# Patient Record
Sex: Male | Born: 1945
Health system: Southern US, Community
[De-identification: ages and names within clinical notes are randomized; demographics above are authoritative.]

## PROBLEM LIST (undated history)

## (undated) DIAGNOSIS — K8020 Calculus of gallbladder without cholecystitis without obstruction: Secondary | ICD-10-CM

## (undated) DIAGNOSIS — B159 Hepatitis A without hepatic coma: Secondary | ICD-10-CM

## (undated) DIAGNOSIS — H34219 Partial retinal artery occlusion, unspecified eye: Secondary | ICD-10-CM

## (undated) DIAGNOSIS — E669 Obesity, unspecified: Secondary | ICD-10-CM

## (undated) DIAGNOSIS — I639 Cerebral infarction, unspecified: Secondary | ICD-10-CM

## (undated) DIAGNOSIS — F329 Major depressive disorder, single episode, unspecified: Secondary | ICD-10-CM

## (undated) DIAGNOSIS — E785 Hyperlipidemia, unspecified: Secondary | ICD-10-CM

## (undated) DIAGNOSIS — H269 Unspecified cataract: Secondary | ICD-10-CM

## (undated) DIAGNOSIS — K224 Dyskinesia of esophagus: Secondary | ICD-10-CM

## (undated) DIAGNOSIS — D649 Anemia, unspecified: Secondary | ICD-10-CM

## (undated) DIAGNOSIS — D689 Coagulation defect, unspecified: Secondary | ICD-10-CM

## (undated) DIAGNOSIS — F419 Anxiety disorder, unspecified: Secondary | ICD-10-CM

## (undated) DIAGNOSIS — K859 Acute pancreatitis without necrosis or infection, unspecified: Secondary | ICD-10-CM

## (undated) DIAGNOSIS — F319 Bipolar disorder, unspecified: Secondary | ICD-10-CM

## (undated) DIAGNOSIS — M1990 Unspecified osteoarthritis, unspecified site: Secondary | ICD-10-CM

## (undated) DIAGNOSIS — N200 Calculus of kidney: Secondary | ICD-10-CM

## (undated) DIAGNOSIS — K219 Gastro-esophageal reflux disease without esophagitis: Secondary | ICD-10-CM

## (undated) DIAGNOSIS — F32A Depression, unspecified: Secondary | ICD-10-CM

## (undated) DIAGNOSIS — I10 Essential (primary) hypertension: Secondary | ICD-10-CM

## (undated) DIAGNOSIS — J449 Chronic obstructive pulmonary disease, unspecified: Secondary | ICD-10-CM

## (undated) DIAGNOSIS — T7840XA Allergy, unspecified, initial encounter: Secondary | ICD-10-CM

## (undated) DIAGNOSIS — Z86010 Personal history of colonic polyps: Secondary | ICD-10-CM

## (undated) HISTORY — PX: CATARACT EXTRACTION: SUR2

## (undated) HISTORY — DX: Personal history of colonic polyps: Z86.010

## (undated) HISTORY — DX: Calculus of kidney: N20.0

## (undated) HISTORY — DX: Unspecified cataract: H26.9

## (undated) HISTORY — PX: EYE SURGERY: SHX253

## (undated) HISTORY — DX: Coagulation defect, unspecified: D68.9

## (undated) HISTORY — DX: Essential (primary) hypertension: I10

## (undated) HISTORY — DX: Acute pancreatitis without necrosis or infection, unspecified: K85.90

## (undated) HISTORY — DX: Allergy, unspecified, initial encounter: T78.40XA

## (undated) HISTORY — DX: Hyperlipidemia, unspecified: E78.5

## (undated) HISTORY — DX: Major depressive disorder, single episode, unspecified: F32.9

## (undated) HISTORY — DX: Depression, unspecified: F32.A

## (undated) HISTORY — DX: Bipolar disorder, unspecified: F31.9

## (undated) HISTORY — DX: Chronic obstructive pulmonary disease, unspecified: J44.9

## (undated) HISTORY — DX: Anemia, unspecified: D64.9

## (undated) HISTORY — PX: JOINT REPLACEMENT: SHX530

## (undated) HISTORY — DX: Hepatitis a without hepatic coma: B15.9

## (undated) HISTORY — DX: Anxiety disorder, unspecified: F41.9

## (undated) HISTORY — DX: Dyskinesia of esophagus: K22.4

## (undated) HISTORY — DX: Obesity, unspecified: E66.9

## (undated) HISTORY — DX: Calculus of gallbladder without cholecystitis without obstruction: K80.20

## (undated) HISTORY — PX: TOTAL KNEE ARTHROPLASTY: SHX125

## (undated) HISTORY — DX: Partial retinal artery occlusion, unspecified eye: H34.219

## (undated) HISTORY — DX: Cerebral infarction, unspecified: I63.9

## (undated) HISTORY — DX: Unspecified osteoarthritis, unspecified site: M19.90

## (undated) HISTORY — PX: CHOLECYSTECTOMY: SHX55

## (undated) HISTORY — PX: COLONOSCOPY: SHX174

## (undated) HISTORY — DX: Gastro-esophageal reflux disease without esophagitis: K21.9

---

## 1998-03-03 ENCOUNTER — Encounter: Admission: RE | Admit: 1998-03-03 | Discharge: 1998-06-01 | Payer: Self-pay | Admitting: *Deleted

## 1998-03-08 ENCOUNTER — Inpatient Hospital Stay (HOSPITAL_COMMUNITY): Admission: EM | Admit: 1998-03-08 | Discharge: 1998-03-10 | Payer: Self-pay | Admitting: Emergency Medicine

## 1998-05-13 ENCOUNTER — Encounter: Admission: RE | Admit: 1998-05-13 | Discharge: 1998-08-11 | Payer: Self-pay | Admitting: Family Medicine

## 1998-12-01 ENCOUNTER — Encounter: Payer: Self-pay | Admitting: Family Medicine

## 1998-12-01 ENCOUNTER — Ambulatory Visit (HOSPITAL_COMMUNITY): Admission: RE | Admit: 1998-12-01 | Discharge: 1998-12-01 | Payer: Self-pay | Admitting: Family Medicine

## 1999-01-09 ENCOUNTER — Ambulatory Visit (HOSPITAL_COMMUNITY): Admission: RE | Admit: 1999-01-09 | Discharge: 1999-01-09 | Payer: Self-pay | Admitting: Urology

## 1999-01-09 ENCOUNTER — Encounter: Payer: Self-pay | Admitting: Urology

## 1999-01-15 ENCOUNTER — Ambulatory Visit: Admission: RE | Admit: 1999-01-15 | Discharge: 1999-01-15 | Payer: Self-pay

## 1999-02-07 ENCOUNTER — Inpatient Hospital Stay (HOSPITAL_COMMUNITY): Admission: EM | Admit: 1999-02-07 | Discharge: 1999-02-10 | Payer: Self-pay | Admitting: Emergency Medicine

## 1999-02-07 ENCOUNTER — Encounter: Payer: Self-pay | Admitting: Emergency Medicine

## 1999-02-10 ENCOUNTER — Encounter (HOSPITAL_COMMUNITY): Payer: Self-pay | Admitting: Psychiatry

## 1999-02-10 ENCOUNTER — Inpatient Hospital Stay (HOSPITAL_COMMUNITY): Admission: EM | Admit: 1999-02-10 | Discharge: 1999-02-15 | Payer: Self-pay | Admitting: Psychiatry

## 1999-02-17 ENCOUNTER — Encounter (HOSPITAL_COMMUNITY): Admission: RE | Admit: 1999-02-17 | Discharge: 1999-05-18 | Payer: Self-pay | Admitting: Psychiatry

## 2002-02-20 ENCOUNTER — Other Ambulatory Visit (HOSPITAL_COMMUNITY): Admission: RE | Admit: 2002-02-20 | Discharge: 2002-03-01 | Payer: Self-pay | Admitting: *Deleted

## 2002-03-20 ENCOUNTER — Encounter: Admission: RE | Admit: 2002-03-20 | Discharge: 2002-06-18 | Payer: Self-pay | Admitting: Family Medicine

## 2003-09-08 ENCOUNTER — Inpatient Hospital Stay (HOSPITAL_COMMUNITY): Admission: EM | Admit: 2003-09-08 | Discharge: 2003-09-11 | Payer: Self-pay | Admitting: Psychiatry

## 2003-10-03 ENCOUNTER — Other Ambulatory Visit (HOSPITAL_COMMUNITY): Admission: RE | Admit: 2003-10-03 | Discharge: 2003-10-16 | Payer: Self-pay | Admitting: Psychiatry

## 2003-10-21 ENCOUNTER — Inpatient Hospital Stay (HOSPITAL_COMMUNITY): Admission: EM | Admit: 2003-10-21 | Discharge: 2003-11-01 | Payer: Self-pay | Admitting: Psychiatry

## 2003-11-04 ENCOUNTER — Other Ambulatory Visit (HOSPITAL_COMMUNITY): Admission: RE | Admit: 2003-11-04 | Discharge: 2003-11-22 | Payer: Self-pay | Admitting: Psychiatry

## 2004-05-06 ENCOUNTER — Encounter: Admission: RE | Admit: 2004-05-06 | Discharge: 2004-05-06 | Payer: Self-pay | Admitting: Family Medicine

## 2004-05-10 ENCOUNTER — Encounter: Admission: RE | Admit: 2004-05-10 | Discharge: 2004-05-10 | Payer: Self-pay | Admitting: Family Medicine

## 2004-10-16 ENCOUNTER — Ambulatory Visit: Payer: Self-pay | Admitting: Gastroenterology

## 2004-11-15 DIAGNOSIS — Z860100 Personal history of colon polyps, unspecified: Secondary | ICD-10-CM

## 2004-11-15 HISTORY — DX: Personal history of colonic polyps: Z86.010

## 2004-11-15 HISTORY — DX: Personal history of colon polyps, unspecified: Z86.0100

## 2004-11-17 ENCOUNTER — Ambulatory Visit: Payer: Self-pay | Admitting: Gastroenterology

## 2005-05-29 ENCOUNTER — Ambulatory Visit: Payer: Self-pay | Admitting: Psychiatry

## 2005-05-29 ENCOUNTER — Emergency Department (HOSPITAL_COMMUNITY): Admission: EM | Admit: 2005-05-29 | Discharge: 2005-05-29 | Payer: Self-pay | Admitting: Emergency Medicine

## 2005-05-29 ENCOUNTER — Inpatient Hospital Stay (HOSPITAL_COMMUNITY): Admission: RE | Admit: 2005-05-29 | Discharge: 2005-06-01 | Payer: Self-pay | Admitting: Psychiatry

## 2006-03-24 ENCOUNTER — Inpatient Hospital Stay (HOSPITAL_COMMUNITY): Admission: RE | Admit: 2006-03-24 | Discharge: 2006-03-28 | Payer: Self-pay | Admitting: *Deleted

## 2006-03-29 ENCOUNTER — Other Ambulatory Visit (HOSPITAL_COMMUNITY): Admission: RE | Admit: 2006-03-29 | Discharge: 2006-04-08 | Payer: Self-pay | Admitting: Psychiatry

## 2006-03-29 ENCOUNTER — Ambulatory Visit: Payer: Self-pay | Admitting: Psychiatry

## 2006-05-30 ENCOUNTER — Ambulatory Visit: Payer: Self-pay | Admitting: Family Medicine

## 2006-08-31 ENCOUNTER — Ambulatory Visit: Payer: Self-pay | Admitting: Family Medicine

## 2006-08-31 LAB — CONVERTED CEMR LAB
ALT: 19 units/L (ref 0–40)
AST: 25 units/L (ref 0–37)
BUN: 18 mg/dL (ref 6–23)
CO2: 27 meq/L (ref 19–32)
Calcium: 9.1 mg/dL (ref 8.4–10.5)
Chloride: 106 meq/L (ref 96–112)
Chol/HDL Ratio, serum: 2.7
Cholesterol: 130 mg/dL (ref 0–200)
Creatinine, Ser: 1.1 mg/dL (ref 0.4–1.5)
GFR calc non Af Amer: 73 mL/min
Glomerular Filtration Rate, Af Am: 88 mL/min/{1.73_m2}
Glucose, Bld: 117 mg/dL — ABNORMAL HIGH (ref 70–99)
HDL: 48.9 mg/dL (ref >39.0)
LDL Cholesterol: 69 mg/dL (ref 0–99)
Potassium: 4 meq/L (ref 3.5–5.1)
Sodium: 138 meq/L (ref 135–145)
Triglyceride fasting, serum: 59 mg/dL (ref 0–149)
VLDL: 12 mg/dL (ref 0–40)

## 2006-12-02 ENCOUNTER — Ambulatory Visit: Payer: Self-pay | Admitting: Family Medicine

## 2007-01-13 ENCOUNTER — Ambulatory Visit: Payer: Self-pay | Admitting: Family Medicine

## 2007-02-22 DIAGNOSIS — E785 Hyperlipidemia, unspecified: Secondary | ICD-10-CM | POA: Insufficient documentation

## 2007-02-22 DIAGNOSIS — J301 Allergic rhinitis due to pollen: Secondary | ICD-10-CM | POA: Insufficient documentation

## 2007-02-22 DIAGNOSIS — Z8669 Personal history of other diseases of the nervous system and sense organs: Secondary | ICD-10-CM | POA: Insufficient documentation

## 2007-02-22 DIAGNOSIS — E1169 Type 2 diabetes mellitus with other specified complication: Secondary | ICD-10-CM | POA: Insufficient documentation

## 2007-02-22 DIAGNOSIS — M545 Low back pain, unspecified: Secondary | ICD-10-CM | POA: Insufficient documentation

## 2007-02-22 DIAGNOSIS — E1142 Type 2 diabetes mellitus with diabetic polyneuropathy: Secondary | ICD-10-CM | POA: Insufficient documentation

## 2007-04-09 ENCOUNTER — Encounter: Payer: Self-pay | Admitting: Internal Medicine

## 2007-04-12 ENCOUNTER — Encounter (INDEPENDENT_AMBULATORY_CARE_PROVIDER_SITE_OTHER): Payer: Self-pay | Admitting: Family Medicine

## 2007-04-12 ENCOUNTER — Encounter: Payer: Self-pay | Admitting: Family Medicine

## 2007-04-12 ENCOUNTER — Ambulatory Visit: Payer: Self-pay | Admitting: Family Medicine

## 2007-07-14 ENCOUNTER — Ambulatory Visit: Payer: Self-pay | Admitting: Family Medicine

## 2007-07-15 ENCOUNTER — Encounter (INDEPENDENT_AMBULATORY_CARE_PROVIDER_SITE_OTHER): Payer: Self-pay | Admitting: Family Medicine

## 2007-07-20 ENCOUNTER — Telehealth (INDEPENDENT_AMBULATORY_CARE_PROVIDER_SITE_OTHER): Payer: Self-pay | Admitting: *Deleted

## 2007-08-03 ENCOUNTER — Telehealth (INDEPENDENT_AMBULATORY_CARE_PROVIDER_SITE_OTHER): Payer: Self-pay | Admitting: *Deleted

## 2007-08-11 ENCOUNTER — Ambulatory Visit: Payer: Self-pay | Admitting: Family Medicine

## 2007-08-11 DIAGNOSIS — J069 Acute upper respiratory infection, unspecified: Secondary | ICD-10-CM | POA: Insufficient documentation

## 2007-08-11 LAB — CONVERTED CEMR LAB: Rapid Strep: NEGATIVE

## 2007-10-09 ENCOUNTER — Ambulatory Visit: Payer: Self-pay | Admitting: Family Medicine

## 2007-10-10 LAB — CONVERTED CEMR LAB: Hgb A1c MFr Bld: 5.9 % (ref 4.6–6.0)

## 2007-10-11 ENCOUNTER — Encounter (INDEPENDENT_AMBULATORY_CARE_PROVIDER_SITE_OTHER): Payer: Self-pay | Admitting: *Deleted

## 2007-11-14 ENCOUNTER — Telehealth (INDEPENDENT_AMBULATORY_CARE_PROVIDER_SITE_OTHER): Payer: Self-pay | Admitting: *Deleted

## 2008-01-10 ENCOUNTER — Ambulatory Visit: Payer: Self-pay | Admitting: Family Medicine

## 2008-01-16 LAB — CONVERTED CEMR LAB
ALT: 22 units/L (ref 0–53)
AST: 23 units/L (ref 0–37)
Cholesterol: 128 mg/dL (ref 0–200)
Creatinine,U: 406.6 mg/dL
Glucose, Bld: 125 mg/dL — ABNORMAL HIGH (ref 70–99)
HDL: 42.7 mg/dL (ref >39.0)
Hgb A1c MFr Bld: 6.1 % — ABNORMAL HIGH (ref 4.6–6.0)
LDL Cholesterol: 69 mg/dL (ref 0–99)
Microalb Creat Ratio: 6.1 mg/g (ref 0.0–30.0)
Microalb, Ur: 2.5 mg/dL — ABNORMAL HIGH (ref 0.0–1.9)
PSA: 0.61 ng/mL (ref 0.10–4.00)
Total CHOL/HDL Ratio: 3
Triglycerides: 83 mg/dL (ref 0–149)
VLDL: 17 mg/dL (ref 0–40)

## 2008-01-17 ENCOUNTER — Encounter (INDEPENDENT_AMBULATORY_CARE_PROVIDER_SITE_OTHER): Payer: Self-pay | Admitting: *Deleted

## 2008-05-09 ENCOUNTER — Ambulatory Visit: Payer: Self-pay | Admitting: Family Medicine

## 2008-05-15 LAB — CONVERTED CEMR LAB
ALT: 19 units/L (ref 0–53)
AST: 23 units/L (ref 0–37)
Albumin: 3.7 g/dL (ref 3.5–5.2)
Alkaline Phosphatase: 61 units/L (ref 39–117)
BUN: 17 mg/dL (ref 6–23)
Bilirubin, Direct: 0.2 mg/dL (ref 0.0–0.3)
CO2: 29 meq/L (ref 19–32)
CRP, High Sensitivity: 8 — ABNORMAL HIGH (ref 0.00–5.00)
Calcium: 9 mg/dL (ref 8.4–10.5)
Chloride: 104 meq/L (ref 96–112)
Cholesterol: 135 mg/dL (ref 0–200)
Creatinine, Ser: 1.1 mg/dL (ref 0.4–1.5)
Creatinine,U: 61.6 mg/dL
GFR calc Af Amer: 88 mL/min
GFR calc non Af Amer: 73 mL/min
Glucose, Bld: 115 mg/dL — ABNORMAL HIGH (ref 70–99)
HDL: 48.9 mg/dL (ref >39.0)
Hgb A1c MFr Bld: 6.1 % — ABNORMAL HIGH (ref 4.6–6.0)
LDL Cholesterol: 72 mg/dL (ref 0–99)
Microalb, Ur: 0.2 mg/dL (ref 0.0–1.9)
Potassium: 4.6 meq/L (ref 3.5–5.1)
Sodium: 138 meq/L (ref 135–145)
Total Bilirubin: 1.3 mg/dL — ABNORMAL HIGH (ref 0.3–1.2)
Total CHOL/HDL Ratio: 2.8
Total Protein: 6.3 g/dL (ref 6.0–8.3)
Triglycerides: 71 mg/dL (ref 0–149)
VLDL: 14 mg/dL (ref 0–40)

## 2008-05-16 ENCOUNTER — Encounter (INDEPENDENT_AMBULATORY_CARE_PROVIDER_SITE_OTHER): Payer: Self-pay | Admitting: *Deleted

## 2008-08-19 ENCOUNTER — Ambulatory Visit: Payer: Self-pay | Admitting: Family Medicine

## 2008-08-19 DIAGNOSIS — M25559 Pain in unspecified hip: Secondary | ICD-10-CM | POA: Insufficient documentation

## 2008-08-19 LAB — CONVERTED CEMR LAB: Hgb A1c MFr Bld: 6.1 % — ABNORMAL HIGH (ref 4.6–6.0)

## 2008-10-31 ENCOUNTER — Ambulatory Visit: Payer: Self-pay | Admitting: Family Medicine

## 2008-11-18 ENCOUNTER — Encounter (INDEPENDENT_AMBULATORY_CARE_PROVIDER_SITE_OTHER): Payer: Self-pay | Admitting: *Deleted

## 2008-11-20 ENCOUNTER — Ambulatory Visit: Payer: Self-pay | Admitting: Family Medicine

## 2008-11-20 DIAGNOSIS — M25569 Pain in unspecified knee: Secondary | ICD-10-CM | POA: Insufficient documentation

## 2008-11-21 ENCOUNTER — Encounter (INDEPENDENT_AMBULATORY_CARE_PROVIDER_SITE_OTHER): Payer: Self-pay | Admitting: *Deleted

## 2008-11-21 LAB — CONVERTED CEMR LAB
ALT: 18 units/L (ref 0–53)
AST: 21 units/L (ref 0–37)
Albumin: 3.7 g/dL (ref 3.5–5.2)
Alkaline Phosphatase: 68 units/L (ref 39–117)
BUN: 16 mg/dL (ref 6–23)
Bilirubin, Direct: 0.1 mg/dL (ref 0.0–0.3)
CO2: 28 meq/L (ref 19–32)
Calcium: 9.1 mg/dL (ref 8.4–10.5)
Chloride: 105 meq/L (ref 96–112)
Cholesterol: 127 mg/dL (ref 0–200)
Creatinine, Ser: 1 mg/dL (ref 0.4–1.5)
GFR calc Af Amer: 98 mL/min
GFR calc non Af Amer: 81 mL/min
Glucose, Bld: 131 mg/dL — ABNORMAL HIGH (ref 70–99)
HDL: 51.3 mg/dL (ref >39.0)
Hgb A1c MFr Bld: 6.1 % — ABNORMAL HIGH (ref 4.6–6.0)
LDL Cholesterol: 61 mg/dL (ref 0–99)
Potassium: 4.1 meq/L (ref 3.5–5.1)
Sodium: 140 meq/L (ref 135–145)
Total Bilirubin: 1 mg/dL (ref 0.3–1.2)
Total CHOL/HDL Ratio: 2.5
Total Protein: 6.4 g/dL (ref 6.0–8.3)
Triglycerides: 72 mg/dL (ref 0–149)
VLDL: 14 mg/dL (ref 0–40)

## 2008-11-22 ENCOUNTER — Encounter: Payer: Self-pay | Admitting: Family Medicine

## 2008-12-10 ENCOUNTER — Telehealth (INDEPENDENT_AMBULATORY_CARE_PROVIDER_SITE_OTHER): Payer: Self-pay | Admitting: *Deleted

## 2008-12-11 ENCOUNTER — Telehealth (INDEPENDENT_AMBULATORY_CARE_PROVIDER_SITE_OTHER): Payer: Self-pay | Admitting: *Deleted

## 2009-01-08 ENCOUNTER — Telehealth (INDEPENDENT_AMBULATORY_CARE_PROVIDER_SITE_OTHER): Payer: Self-pay | Admitting: *Deleted

## 2009-01-21 ENCOUNTER — Telehealth (INDEPENDENT_AMBULATORY_CARE_PROVIDER_SITE_OTHER): Payer: Self-pay | Admitting: *Deleted

## 2009-02-20 ENCOUNTER — Ambulatory Visit: Payer: Self-pay | Admitting: Family Medicine

## 2009-02-20 DIAGNOSIS — N478 Other disorders of prepuce: Secondary | ICD-10-CM | POA: Insufficient documentation

## 2009-02-20 DIAGNOSIS — N471 Phimosis: Secondary | ICD-10-CM | POA: Insufficient documentation

## 2009-02-20 DIAGNOSIS — F319 Bipolar disorder, unspecified: Secondary | ICD-10-CM | POA: Insufficient documentation

## 2009-02-24 ENCOUNTER — Telehealth (INDEPENDENT_AMBULATORY_CARE_PROVIDER_SITE_OTHER): Payer: Self-pay | Admitting: *Deleted

## 2009-02-26 ENCOUNTER — Encounter (INDEPENDENT_AMBULATORY_CARE_PROVIDER_SITE_OTHER): Payer: Self-pay | Admitting: *Deleted

## 2009-02-26 ENCOUNTER — Ambulatory Visit: Payer: Self-pay | Admitting: Family Medicine

## 2009-02-26 LAB — CONVERTED CEMR LAB
OCCULT 1: NEGATIVE
OCCULT 2: NEGATIVE
OCCULT 3: NEGATIVE

## 2009-03-06 ENCOUNTER — Encounter: Payer: Self-pay | Admitting: Family Medicine

## 2009-03-21 ENCOUNTER — Ambulatory Visit: Payer: Self-pay | Admitting: Family Medicine

## 2009-03-31 ENCOUNTER — Encounter (INDEPENDENT_AMBULATORY_CARE_PROVIDER_SITE_OTHER): Payer: Self-pay | Admitting: Urology

## 2009-03-31 ENCOUNTER — Ambulatory Visit (HOSPITAL_BASED_OUTPATIENT_CLINIC_OR_DEPARTMENT_OTHER): Admission: RE | Admit: 2009-03-31 | Discharge: 2009-03-31 | Payer: Self-pay | Admitting: Urology

## 2009-04-07 ENCOUNTER — Encounter: Payer: Self-pay | Admitting: Family Medicine

## 2009-05-14 ENCOUNTER — Ambulatory Visit: Payer: Self-pay | Admitting: Family Medicine

## 2009-05-14 ENCOUNTER — Telehealth: Payer: Self-pay | Admitting: Family Medicine

## 2009-05-14 DIAGNOSIS — S61209A Unspecified open wound of unspecified finger without damage to nail, initial encounter: Secondary | ICD-10-CM | POA: Insufficient documentation

## 2009-05-22 ENCOUNTER — Ambulatory Visit: Payer: Self-pay | Admitting: Family Medicine

## 2009-05-26 LAB — CONVERTED CEMR LAB: Hgb A1c MFr Bld: 6 % (ref 4.6–6.5)

## 2009-06-10 ENCOUNTER — Encounter (INDEPENDENT_AMBULATORY_CARE_PROVIDER_SITE_OTHER): Payer: Self-pay | Admitting: Orthopedic Surgery

## 2009-06-10 ENCOUNTER — Ambulatory Visit (HOSPITAL_BASED_OUTPATIENT_CLINIC_OR_DEPARTMENT_OTHER): Admission: RE | Admit: 2009-06-10 | Discharge: 2009-06-10 | Payer: Self-pay | Admitting: Orthopedic Surgery

## 2009-06-17 ENCOUNTER — Encounter: Payer: Self-pay | Admitting: Family Medicine

## 2009-07-22 ENCOUNTER — Ambulatory Visit: Payer: Self-pay | Admitting: Family Medicine

## 2009-08-05 ENCOUNTER — Encounter: Payer: Self-pay | Admitting: Family Medicine

## 2009-08-06 ENCOUNTER — Telehealth (INDEPENDENT_AMBULATORY_CARE_PROVIDER_SITE_OTHER): Payer: Self-pay | Admitting: *Deleted

## 2009-08-22 ENCOUNTER — Ambulatory Visit: Payer: Self-pay | Admitting: Family Medicine

## 2009-08-24 LAB — CONVERTED CEMR LAB
ALT: 17 units/L (ref 0–53)
AST: 19 units/L (ref 0–37)
Albumin: 3.8 g/dL (ref 3.5–5.2)
Alkaline Phosphatase: 63 units/L (ref 39–117)
Bilirubin, Direct: 0.1 mg/dL (ref 0.0–0.3)
Cholesterol: 139 mg/dL (ref 0–200)
HDL: 44.8 mg/dL (ref >39.00)
Hgb A1c MFr Bld: 6.5 % (ref 4.6–6.5)
LDL Cholesterol: 83 mg/dL (ref 0–99)
Total Bilirubin: 0.9 mg/dL (ref 0.3–1.2)
Total CHOL/HDL Ratio: 3
Total Protein: 6 g/dL (ref 6.0–8.3)
Triglycerides: 54 mg/dL (ref 0.0–149.0)
VLDL: 10.8 mg/dL (ref 0.0–40.0)

## 2009-10-14 ENCOUNTER — Ambulatory Visit: Payer: Self-pay | Admitting: Internal Medicine

## 2009-10-14 DIAGNOSIS — K219 Gastro-esophageal reflux disease without esophagitis: Secondary | ICD-10-CM | POA: Insufficient documentation

## 2009-10-14 DIAGNOSIS — J45909 Unspecified asthma, uncomplicated: Secondary | ICD-10-CM | POA: Insufficient documentation

## 2009-11-17 ENCOUNTER — Encounter (INDEPENDENT_AMBULATORY_CARE_PROVIDER_SITE_OTHER): Payer: Self-pay | Admitting: *Deleted

## 2009-11-24 ENCOUNTER — Telehealth (INDEPENDENT_AMBULATORY_CARE_PROVIDER_SITE_OTHER): Payer: Self-pay | Admitting: *Deleted

## 2009-11-26 ENCOUNTER — Ambulatory Visit: Payer: Self-pay | Admitting: Family

## 2009-11-26 ENCOUNTER — Ambulatory Visit: Payer: Self-pay | Admitting: Diagnostic Radiology

## 2009-11-26 ENCOUNTER — Ambulatory Visit (HOSPITAL_BASED_OUTPATIENT_CLINIC_OR_DEPARTMENT_OTHER): Admission: RE | Admit: 2009-11-26 | Discharge: 2009-11-26 | Payer: Self-pay | Admitting: Internal Medicine

## 2009-11-26 ENCOUNTER — Telehealth (INDEPENDENT_AMBULATORY_CARE_PROVIDER_SITE_OTHER): Payer: Self-pay | Admitting: *Deleted

## 2009-11-26 DIAGNOSIS — J45901 Unspecified asthma with (acute) exacerbation: Secondary | ICD-10-CM | POA: Insufficient documentation

## 2009-11-26 LAB — CONVERTED CEMR LAB
ALT: 21 units/L (ref 0–53)
AST: 23 units/L (ref 0–37)
Albumin: 3.7 g/dL (ref 3.5–5.2)
Alkaline Phosphatase: 68 units/L (ref 39–117)
BUN: 21 mg/dL (ref 6–23)
Bilirubin, Direct: 0 mg/dL (ref 0.0–0.3)
CO2: 27 meq/L (ref 19–32)
Calcium: 9 mg/dL (ref 8.4–10.5)
Chloride: 105 meq/L (ref 96–112)
Cholesterol: 144 mg/dL (ref 0–200)
Creatinine, Ser: 1.1 mg/dL (ref 0.4–1.5)
GFR calc non Af Amer: 72.21 mL/min (ref >60)
Glucose, Bld: 168 mg/dL — ABNORMAL HIGH (ref 70–99)
HDL: 44.9 mg/dL (ref >39.00)
Hgb A1c MFr Bld: 6.8 % — ABNORMAL HIGH (ref 4.6–6.5)
LDL Cholesterol: 79 mg/dL (ref 0–99)
Potassium: 4.5 meq/L (ref 3.5–5.1)
Sodium: 140 meq/L (ref 135–145)
Total Bilirubin: 1 mg/dL (ref 0.3–1.2)
Total CHOL/HDL Ratio: 3
Total Protein: 6.6 g/dL (ref 6.0–8.3)
Triglycerides: 102 mg/dL (ref 0.0–149.0)
VLDL: 20.4 mg/dL (ref 0.0–40.0)

## 2009-11-27 ENCOUNTER — Encounter: Payer: Self-pay | Admitting: Family

## 2009-11-27 ENCOUNTER — Telehealth (INDEPENDENT_AMBULATORY_CARE_PROVIDER_SITE_OTHER): Payer: Self-pay | Admitting: *Deleted

## 2010-01-02 ENCOUNTER — Telehealth: Payer: Self-pay | Admitting: Internal Medicine

## 2010-01-07 ENCOUNTER — Telehealth (INDEPENDENT_AMBULATORY_CARE_PROVIDER_SITE_OTHER): Payer: Self-pay | Admitting: *Deleted

## 2010-01-14 ENCOUNTER — Encounter: Payer: Self-pay | Admitting: Family Medicine

## 2010-01-14 ENCOUNTER — Ambulatory Visit: Payer: Self-pay | Admitting: Family

## 2010-01-14 DIAGNOSIS — R9431 Abnormal electrocardiogram [ECG] [EKG]: Secondary | ICD-10-CM | POA: Insufficient documentation

## 2010-01-14 LAB — CONVERTED CEMR LAB
Creatinine,U: 223.6 mg/dL
Microalb Creat Ratio: 7.6 mg/g (ref 0.0–30.0)
Microalb, Ur: 1.7 mg/dL (ref 0.0–1.9)

## 2010-01-16 ENCOUNTER — Ambulatory Visit: Payer: Self-pay | Admitting: Internal Medicine

## 2010-01-16 DIAGNOSIS — I10 Essential (primary) hypertension: Secondary | ICD-10-CM | POA: Insufficient documentation

## 2010-01-20 ENCOUNTER — Telehealth (INDEPENDENT_AMBULATORY_CARE_PROVIDER_SITE_OTHER): Payer: Self-pay | Admitting: *Deleted

## 2010-01-21 ENCOUNTER — Encounter (HOSPITAL_COMMUNITY): Admission: RE | Admit: 2010-01-21 | Discharge: 2010-03-17 | Payer: Self-pay | Admitting: Internal Medicine

## 2010-01-21 ENCOUNTER — Ambulatory Visit: Payer: Self-pay | Admitting: Internal Medicine

## 2010-01-21 ENCOUNTER — Encounter: Payer: Self-pay | Admitting: Internal Medicine

## 2010-01-26 ENCOUNTER — Telehealth: Payer: Self-pay | Admitting: Internal Medicine

## 2010-01-26 ENCOUNTER — Encounter: Payer: Self-pay | Admitting: Internal Medicine

## 2010-01-30 ENCOUNTER — Encounter: Payer: Self-pay | Admitting: Family

## 2010-02-24 ENCOUNTER — Ambulatory Visit: Payer: Self-pay | Admitting: Family Medicine

## 2010-02-25 LAB — CONVERTED CEMR LAB: Hgb A1c MFr Bld: 6.8 % — ABNORMAL HIGH (ref 4.6–6.5)

## 2010-03-03 ENCOUNTER — Inpatient Hospital Stay (HOSPITAL_COMMUNITY): Admission: RE | Admit: 2010-03-03 | Discharge: 2010-03-05 | Payer: Self-pay | Admitting: Orthopedic Surgery

## 2010-03-07 ENCOUNTER — Emergency Department (HOSPITAL_COMMUNITY): Admission: EM | Admit: 2010-03-07 | Discharge: 2010-03-07 | Payer: Self-pay | Admitting: Emergency Medicine

## 2010-03-30 ENCOUNTER — Encounter: Admission: RE | Admit: 2010-03-30 | Discharge: 2010-05-29 | Payer: Self-pay | Admitting: Orthopedic Surgery

## 2010-05-11 ENCOUNTER — Telehealth (INDEPENDENT_AMBULATORY_CARE_PROVIDER_SITE_OTHER): Payer: Self-pay | Admitting: *Deleted

## 2010-05-12 ENCOUNTER — Telehealth (INDEPENDENT_AMBULATORY_CARE_PROVIDER_SITE_OTHER): Payer: Self-pay | Admitting: *Deleted

## 2010-05-26 ENCOUNTER — Encounter: Payer: Self-pay | Admitting: Family Medicine

## 2010-05-26 ENCOUNTER — Telehealth: Payer: Self-pay | Admitting: Family Medicine

## 2010-05-26 LAB — HM DIABETES EYE EXAM: HM Diabetic Eye Exam: NORMAL

## 2010-05-27 ENCOUNTER — Ambulatory Visit: Payer: Self-pay | Admitting: Family Medicine

## 2010-05-27 DIAGNOSIS — H34219 Partial retinal artery occlusion, unspecified eye: Secondary | ICD-10-CM | POA: Insufficient documentation

## 2010-05-27 LAB — HM DIABETES FOOT EXAM: HM Diabetic Foot Exam: NORMAL

## 2010-05-28 ENCOUNTER — Telehealth: Payer: Self-pay | Admitting: Family Medicine

## 2010-05-28 LAB — CONVERTED CEMR LAB
ALT: 13 units/L (ref 0–53)
AST: 18 units/L (ref 0–37)
Albumin: 3.9 g/dL (ref 3.5–5.2)
Alkaline Phosphatase: 82 units/L (ref 39–117)
BUN: 25 mg/dL — ABNORMAL HIGH (ref 6–23)
Basophils Absolute: 0 10*3/uL (ref 0.0–0.1)
Basophils Relative: 0.3 % (ref 0.0–3.0)
Bilirubin, Direct: 0.2 mg/dL (ref 0.0–0.3)
CO2: 27 meq/L (ref 19–32)
Calcium: 9 mg/dL (ref 8.4–10.5)
Chloride: 107 meq/L (ref 96–112)
Cholesterol: 135 mg/dL (ref 0–200)
Creatinine, Ser: 1.1 mg/dL (ref 0.4–1.5)
Eosinophils Absolute: 0.3 10*3/uL (ref 0.0–0.7)
Eosinophils Relative: 4.4 % (ref 0.0–5.0)
GFR calc non Af Amer: 76.07 mL/min (ref >60)
Glucose, Bld: 188 mg/dL — ABNORMAL HIGH (ref 70–99)
HCT: 40.6 % (ref 39.0–52.0)
HDL: 37.4 mg/dL — ABNORMAL LOW (ref >39.00)
Hemoglobin: 13.7 g/dL (ref 13.0–17.0)
Hgb A1c MFr Bld: 7.2 % — ABNORMAL HIGH (ref 4.6–6.5)
LDL Cholesterol: 81 mg/dL (ref 0–99)
Lymphocytes Relative: 19.4 % (ref 12.0–46.0)
Lymphs Abs: 1.5 10*3/uL (ref 0.7–4.0)
MCHC: 33.8 g/dL (ref 30.0–36.0)
MCV: 82.1 fL (ref 78.0–100.0)
Monocytes Absolute: 0.7 10*3/uL (ref 0.1–1.0)
Monocytes Relative: 9.2 % (ref 3.0–12.0)
Neutro Abs: 5.1 10*3/uL (ref 1.4–7.7)
Neutrophils Relative %: 66.7 % (ref 43.0–77.0)
PSA: 0.66 ng/mL (ref 0.10–4.00)
Platelets: 256 10*3/uL (ref 150.0–400.0)
Potassium: 4.9 meq/L (ref 3.5–5.1)
RBC: 4.95 M/uL (ref 4.22–5.81)
RDW: 15.2 % — ABNORMAL HIGH (ref 11.5–14.6)
Sodium: 140 meq/L (ref 135–145)
TSH: 1.11 microintl units/mL (ref 0.35–5.50)
Total Bilirubin: 1 mg/dL (ref 0.3–1.2)
Total CHOL/HDL Ratio: 4
Total Protein: 6.5 g/dL (ref 6.0–8.3)
Triglycerides: 82 mg/dL (ref 0.0–149.0)
VLDL: 16.4 mg/dL (ref 0.0–40.0)
WBC: 7.7 10*3/uL (ref 4.5–10.5)

## 2010-05-29 ENCOUNTER — Telehealth (INDEPENDENT_AMBULATORY_CARE_PROVIDER_SITE_OTHER): Payer: Self-pay | Admitting: *Deleted

## 2010-06-01 ENCOUNTER — Telehealth (INDEPENDENT_AMBULATORY_CARE_PROVIDER_SITE_OTHER): Payer: Self-pay | Admitting: *Deleted

## 2010-06-03 ENCOUNTER — Telehealth (INDEPENDENT_AMBULATORY_CARE_PROVIDER_SITE_OTHER): Payer: Self-pay | Admitting: *Deleted

## 2010-06-09 ENCOUNTER — Inpatient Hospital Stay (HOSPITAL_COMMUNITY): Admission: RE | Admit: 2010-06-09 | Discharge: 2010-06-12 | Payer: Self-pay | Admitting: Orthopedic Surgery

## 2010-06-12 ENCOUNTER — Telehealth (INDEPENDENT_AMBULATORY_CARE_PROVIDER_SITE_OTHER): Payer: Self-pay | Admitting: *Deleted

## 2010-06-22 ENCOUNTER — Encounter: Payer: Self-pay | Admitting: Family Medicine

## 2010-06-23 ENCOUNTER — Encounter: Payer: Self-pay | Admitting: Family Medicine

## 2010-06-24 ENCOUNTER — Encounter: Payer: Self-pay | Admitting: Cardiovascular Disease

## 2010-06-24 ENCOUNTER — Ambulatory Visit: Payer: Self-pay

## 2010-07-01 ENCOUNTER — Encounter (INDEPENDENT_AMBULATORY_CARE_PROVIDER_SITE_OTHER): Payer: Self-pay | Admitting: *Deleted

## 2010-07-06 ENCOUNTER — Telehealth (INDEPENDENT_AMBULATORY_CARE_PROVIDER_SITE_OTHER): Payer: Self-pay | Admitting: *Deleted

## 2010-07-07 ENCOUNTER — Encounter
Admission: RE | Admit: 2010-07-07 | Discharge: 2010-10-05 | Payer: Self-pay | Source: Home / Self Care | Admitting: Orthopedic Surgery

## 2010-07-20 ENCOUNTER — Emergency Department (HOSPITAL_BASED_OUTPATIENT_CLINIC_OR_DEPARTMENT_OTHER): Admission: EM | Admit: 2010-07-20 | Discharge: 2010-07-20 | Payer: Self-pay | Admitting: Emergency Medicine

## 2010-07-22 ENCOUNTER — Encounter (INDEPENDENT_AMBULATORY_CARE_PROVIDER_SITE_OTHER): Payer: Self-pay | Admitting: *Deleted

## 2010-08-12 ENCOUNTER — Encounter (INDEPENDENT_AMBULATORY_CARE_PROVIDER_SITE_OTHER): Payer: Self-pay | Admitting: *Deleted

## 2010-08-17 ENCOUNTER — Encounter (INDEPENDENT_AMBULATORY_CARE_PROVIDER_SITE_OTHER): Payer: Self-pay | Admitting: *Deleted

## 2010-08-17 ENCOUNTER — Ambulatory Visit: Payer: Self-pay | Admitting: Gastroenterology

## 2010-08-27 ENCOUNTER — Ambulatory Visit: Payer: Self-pay | Admitting: Family Medicine

## 2010-08-27 LAB — CONVERTED CEMR LAB: Hgb A1c MFr Bld: 6.7 % — ABNORMAL HIGH (ref 4.6–6.5)

## 2010-09-02 ENCOUNTER — Ambulatory Visit: Payer: Self-pay | Admitting: Family Medicine

## 2010-09-04 ENCOUNTER — Ambulatory Visit: Payer: Self-pay | Admitting: Family Medicine

## 2010-09-30 ENCOUNTER — Ambulatory Visit: Payer: Self-pay | Admitting: Gastroenterology

## 2010-09-30 LAB — HM COLONOSCOPY: HM Colonoscopy: NORMAL

## 2010-10-12 ENCOUNTER — Telehealth (INDEPENDENT_AMBULATORY_CARE_PROVIDER_SITE_OTHER): Payer: Self-pay | Admitting: *Deleted

## 2010-11-27 ENCOUNTER — Ambulatory Visit
Admission: RE | Admit: 2010-11-27 | Discharge: 2010-11-27 | Payer: Self-pay | Source: Home / Self Care | Attending: Family Medicine | Admitting: Family Medicine

## 2010-11-27 ENCOUNTER — Other Ambulatory Visit: Payer: Self-pay | Admitting: Family Medicine

## 2010-11-27 LAB — HEPATIC FUNCTION PANEL
ALT: 15 U/L (ref 0–53)
AST: 20 U/L (ref 0–37)
Albumin: 4.2 g/dL (ref 3.5–5.2)
Alkaline Phosphatase: 76 U/L (ref 39–117)
Bilirubin, Direct: 0.2 mg/dL (ref 0.0–0.3)
Total Bilirubin: 1.2 mg/dL (ref 0.3–1.2)
Total Protein: 6.7 g/dL (ref 6.0–8.3)

## 2010-11-27 LAB — LIPID PANEL
Cholesterol: 115 mg/dL (ref 0–200)
HDL: 42.4 mg/dL (ref >39.00)
LDL Cholesterol: 61 mg/dL (ref 0–99)
Total CHOL/HDL Ratio: 3
Triglycerides: 58 mg/dL (ref 0.0–149.0)
VLDL: 11.6 mg/dL (ref 0.0–40.0)

## 2010-11-27 LAB — BASIC METABOLIC PANEL
BUN: 27 mg/dL — ABNORMAL HIGH (ref 6–23)
CO2: 27 mEq/L (ref 19–32)
Calcium: 9.1 mg/dL (ref 8.4–10.5)
Chloride: 107 mEq/L (ref 96–112)
Creatinine, Ser: 1 mg/dL (ref 0.4–1.5)
GFR: 77.65 mL/min (ref >60.00)
Glucose, Bld: 122 mg/dL — ABNORMAL HIGH (ref 70–99)
Potassium: 4.9 mEq/L (ref 3.5–5.1)
Sodium: 141 mEq/L (ref 135–145)

## 2010-11-27 LAB — HEMOGLOBIN A1C: Hgb A1c MFr Bld: 6.6 % — ABNORMAL HIGH (ref 4.6–6.5)

## 2010-12-13 LAB — CONVERTED CEMR LAB
ALT: 18 units/L (ref 0–53)
AST: 21 units/L (ref 0–37)
Albumin: 3.7 g/dL (ref 3.5–5.2)
Alkaline Phosphatase: 68 units/L (ref 39–117)
BUN: 18 mg/dL (ref 6–23)
Basophils Absolute: 0 10*3/uL (ref 0.0–0.1)
Basophils Relative: 0.2 % (ref 0.0–3.0)
Bilirubin, Direct: 0.2 mg/dL (ref 0.0–0.3)
CO2: 29 meq/L (ref 19–32)
Calcium: 8.9 mg/dL (ref 8.4–10.5)
Chloride: 106 meq/L (ref 96–112)
Cholesterol: 143 mg/dL (ref 0–200)
Creatinine, Ser: 1 mg/dL (ref 0.4–1.5)
Eosinophils Absolute: 0.2 10*3/uL (ref 0.0–0.7)
Eosinophils Relative: 3 % (ref 0.0–5.0)
GFR calc non Af Amer: 80.82 mL/min (ref >60)
Glucose, Bld: 171 mg/dL — ABNORMAL HIGH (ref 70–99)
HCT: 40.5 % (ref 39.0–52.0)
HDL: 44.2 mg/dL (ref >39.00)
Hemoglobin: 13.4 g/dL (ref 13.0–17.0)
Hgb A1c MFr Bld: 6.4 % (ref 4.6–6.5)
LDL Cholesterol: 90 mg/dL (ref 0–99)
Lymphocytes Relative: 28.4 % (ref 12.0–46.0)
Lymphs Abs: 2.1 10*3/uL (ref 0.7–4.0)
MCHC: 33 g/dL (ref 30.0–36.0)
MCV: 85.1 fL (ref 78.0–100.0)
Monocytes Absolute: 0.5 10*3/uL (ref 0.1–1.0)
Monocytes Relative: 6.2 % (ref 3.0–12.0)
Neutro Abs: 4.5 10*3/uL (ref 1.4–7.7)
Neutrophils Relative %: 62.2 % (ref 43.0–77.0)
PSA: 0.61 ng/mL (ref 0.10–4.00)
Platelets: 210 10*3/uL (ref 150.0–400.0)
Potassium: 5.1 meq/L (ref 3.5–5.1)
RBC: 4.76 M/uL (ref 4.22–5.81)
RDW: 13.5 % (ref 11.5–14.6)
Sodium: 140 meq/L (ref 135–145)
TSH: 0.69 microintl units/mL (ref 0.35–5.50)
Total Bilirubin: 1.2 mg/dL (ref 0.3–1.2)
Total CHOL/HDL Ratio: 3
Total Protein: 7.2 g/dL (ref 6.0–8.3)
Triglycerides: 46 mg/dL (ref 0.0–149.0)
VLDL: 9.2 mg/dL (ref 0.0–40.0)
WBC: 7.3 10*3/uL (ref 4.5–10.5)

## 2010-12-15 NOTE — Assessment & Plan Note (Signed)
Summary: Cardiology Nuclear Study  Nuclear Med Background Indications for Stress Test: Evaluation for Ischemia, Surgical Clearance, Abnormal EKG  Indications Comments: Pending Knee Surgery  History: Asthma   Symptoms: DOE    Nuclear Pre-Procedure Cardiac Risk Factors: Hypertension, Lipids, RBBB Caffeine/Decaff Intake: None NPO After: 9:00 PM Lungs: clear IV 0.9% NS with Angio Cath: 20g     IV Site: (R) AC IV Started by: Stanton Kidney EMT-P Chest Size (in) 54     Height (in): 70 Weight (lb): 273 BMI: 39.31 Tech Comments: CBG = 126 @ 11 am, per Patient.  Nuclear Med Study 1 or 2 day study:  1 day     Stress Test Type:  Eugenie Birks Reading MD:  Arvilla Meres, MD     Referring MD:  P.Ross Resting Radionuclide:  Technetium 35m Tetrofosmin     Resting Radionuclide Dose:  11.0 mCi  Stress Radionuclide:  Technetium 57m Tetrofosmin     Stress Radionuclide Dose:  33.0 mCi   Stress Protocol   Lexiscan: 0.4 mg   Stress Test Technologist:  Milana Na EMT-P     Nuclear Technologist:  Burna Mortimer Deal RT-N  Rest Procedure  Myocardial perfusion imaging was performed at rest 45 minutes following the intravenous administration of Myoview Technetium 69m Tetrofosmin.  Stress Procedure  The patient received IV Lexiscan 0.4 mg over 15-seconds.  Myoview injected at 30-seconds.  There were no significant changes with infusion.  Quantitative spect images were obtained after a 45 minute delay.  QPS Raw Data Images:  Normal; no motion artifact; normal heart/lung ratio. Stress Images:  There is normal uptake in all areas. Rest Images:  Normal homogeneous uptake in all areas of the myocardium. Subtraction (SDS):  Normal Transient Ischemic Dilatation:  1.03  (Normal <1.22)  Lung/Heart Ratio:  .36  (Normal <0.45)  Quantitative Gated Spect Images QGS EDV:  80 ml QGS ESV:  18 ml QGS EF:  78 % QGS cine images:  Normal  Findings Normal nuclear study      Overall Impression  Exercise  Capacity: Lexiscan study with no exercise. ECG Impression: Baseline: NSR; No significant ST segment change with Lexiscan. Overall Impression: Normal stress nuclear study.  Appended Document: Cardiology Nuclear Study Normal nuclear study.  No ischemia.  LVEF is normal.  Patient is at low risk for major cardiac event and is ok to proceed without further testing.  Appended Document: Cardiology Nuclear Study Patient aware of results of nuc stress test.He is having an echo done on 3/11 and states he has a presurgical form for [place] to complete. He will bring it in tomorrow. Advised will call him back after the echo is completed with results.

## 2010-12-15 NOTE — Assessment & Plan Note (Signed)
Summary: Ryan Zhang 4 months,cbs   Vital Signs:  Patient Profile:   65 Years Old Male Height:     70 inches Weight:      250 pounds Temp:     97.9 degrees F oral Pulse rate:   78 / minute Resp:     16 per minute BP sitting:   120 / 82  (right arm)  Pt. in pain?   no  Vitals Entered By: Ardyth Man (May 09, 2008 9:28 AM)                  PCP:  Laury Axon  Chief Complaint:  4 month dm, cholesterol check, and Type 2 diabetes mellitus follow-up.  History of Present Illness:  Hyperlipidemia Follow-Up      This is a 65 year old man who presents for Hyperlipidemia follow-up.  The patient denies muscle aches, GI upset, abdominal pain, flushing, itching, constipation, diarrhea, and fatigue.  The patient denies the following symptoms: chest pain/pressure, exercise intolerance, dypsnea, palpitations, syncope, and pedal edema.  Compliance with medications (by patient report) has been near 100%.  Dietary compliance has been fair.  The patient reports exercising daily.  Adjunctive measures currently used by the patient include ASA.    Type 2 Diabetes Mellitus Follow-Up      The patient is also here for Type 2 diabetes mellitus follow-up.  The patient denies polyuria, polydipsia, blurred vision, self managed hypoglycemia, hypoglycemia requiring help, weight loss, weight gain, and numbness of extremities.  The patient denies the following symptoms: neuropathic pain, chest pain, vomiting, orthostatic symptoms, poor wound healing, intermittent claudication, vision loss, and foot ulcer.  Since the last visit the patient reports poor dietary compliance, compliance with medications, exercising regularly, and monitoring blood glucose.  Since the last visit, the patient reports having had eye care by an ophthalmologist and no foot care.  Pt checks BS sporadically. Pt    Current Allergies: ! PERCOCET ! DEPAKOTE ! CELEXA ! PAXIL ! WELLBUTRIN ! * SMZ ! * SYMBIAX  Past Medical History:    Reviewed  history from 02/22/2007 and no changes required:       Diabetes mellitus, type II       Hyperlipidemia       Hypertension       Bipolar     Review of Systems      See HPI   Physical Exam  General:     Well-developed,well-nourished,in no acute distress; alert,appropriate and cooperative throughout examination Lungs:     Normal respiratory effort, chest expands symmetrically. Lungs are clear to auscultation, no crackles or wheezes. Heart:     normal rate, regular rhythm, and no murmur.    Diabetes Management Exam:    Foot Exam (with socks and/or shoes not present):       Sensory-Pinprick/Light touch:          Left medial foot (L-4): normal          Left dorsal foot (L-5): normal          Left lateral foot (S-1): normal          Right medial foot (L-4): normal          Right dorsal foot (L-5): normal          Right lateral foot (S-1): normal       Sensory-Monofilament:          Left foot: normal          Right foot: normal  Inspection:          Left foot: normal          Right foot: normal       Nails:          Left foot: normal          Right foot: normal    Eye Exam:       Eye Exam done elsewhere    Impression & Recommendations:  Problem # 1:  HYPERLIPIDEMIA (ICD-272.4)  His updated medication list for this problem includes:    Advicor 500-20 Mg Tb24 (Niacin-lovastatin) .[name]... Take 2 tablet by mouth once a day  Orders: Venipuncture (45409) TLB-Lipid Panel (80061-LIPID) TLB-BMP (Basic Metabolic Panel-BMET) (80048-METABOL) TLB-Hepatic/Liver Function Pnl (80076-HEPATIC) TLB-A1C / Hgb A1C (Glycohemoglobin) (83036-A1C) TLB-Microalbumin/Creat Ratio, Urine (82043-MALB) TLB-CRP-Full Range (C-Reactive Protein) (86140-FCRP)  Labs Reviewed: Chol: 128 (01/10/2008)   HDL: 42.7 (01/10/2008)   LDL: 69 (01/10/2008)   TG: 83 (01/10/2008) SGOT: 23 (01/10/2008)   SGPT: 22 (01/10/2008)   Problem # 2:  DIABETES MELLITUS, TYPE II (ICD-250.00)  His updated medication  list for this problem includes:    Enalapril Maleate 2.5 Mg Tabs (Enalapril maleate) .[name]... Take one tablet daily    Actoplus Met 15-500 Mg Tabs (Pioglitazone hcl-metformin hcl) .[name]... Take one tablet twice daily    Adprin B 325 Mg Tabs (Aspirin buf(cacarb-mgcarb-mgo)) .[name]... Take one tablet daily  Orders: Venipuncture (81191) TLB-Lipid Panel (80061-LIPID) TLB-BMP (Basic Metabolic Panel-BMET) (80048-METABOL) TLB-Hepatic/Liver Function Pnl (80076-HEPATIC) TLB-A1C / Hgb A1C (Glycohemoglobin) (83036-A1C) TLB-Microalbumin/Creat Ratio, Urine (82043-MALB) TLB-CRP-Full Range (C-Reactive Protein) (86140-FCRP)  Labs Reviewed: HgBA1c: 6.1 (01/10/2008)   Creat: 1.1 (08/31/2006)      Complete Medication List: 1)  Cymbalta 60 Mg Cpep (Duloxetine hcl) 2)  Risperdal 2 Mg Tabs (Risperidone) .... Take one tablet once daily at bedtime. psychologist rx's 3)  Enalapril Maleate 2.5 Mg Tabs (Enalapril maleate) .... Take one tablet daily 4)  Clonazepam 0.5 Mg Tabs (Clonazepam) 5)  Gabapentin 300 Mg Caps (Gabapentin) 6)  Advicor 500-20 Mg Tb24 (Niacin-lovastatin) .... Take 2 tablet by mouth once a day 7)  Benztropine Mesylate 2 Mg Tabs (Benztropine mesylate) 8)  Fexofenadine Hcl 180 Mg Tabs (Fexofenadine hcl) 9)  Actoplus Met 15-500 Mg Tabs (Pioglitazone hcl-metformin hcl) .... Take one tablet twice daily 10)  Ambien Cr 12.5 Mg Tbcr (Zolpidem tartrate) .... Take one tablet at bedtime as needed 11)  Cogentin 1 Mg/ml Soln (Benztropine mesylate) .... Take twice daily 12)  Adprin B 325 Mg Tabs (Aspirin buf(cacarb-mgcarb-mgo)) .... Take one tablet daily 13)  Meclizine Hcl 25 Mg Tabs (Meclizine hcl) .... Take one tablet daily as needed for vertigo 14)  Qvar 40 Mcg/act Aers (Beclomethasone dipropionate) .... Use 2 puffs as needed in the morning 15)  Flonase 50 Mcg/act Susp (Fluticasone propionate) .... Use two sprays in each nostril twice daily as needed.    Prescriptions: ACTOPLUS MET 15-500 MG TABS  (PIOGLITAZONE HCL-METFORMIN HCL) TAKE ONE TABLET TWICE DAILY  #180 x 3   Entered and Authorized by:   Loreen Freud DO   Signed by:   Loreen Freud DO on 05/09/2008   Method used:   Print then Give to Patient   RxID:   [id number] ADVICOR 500-20 MG TB24 (NIACIN-LOVASTATIN) Take 2 tablet by mouth once a day  #180 x 3   Entered and Authorized by:   Loreen Freud DO   Signed by:   Loreen Freud DO on 05/09/2008   Method used:   Print then Give to Patient  RxID:   [id number] ENALAPRIL MALEATE 2.5 MG TABS (ENALAPRIL MALEATE) TAKE ONE TABLET DAILY  #90 x 3   Entered and Authorized by:   Loreen Freud DO   Signed by:   Loreen Freud DO on 05/09/2008   Method used:   Print then Give to Patient   RxID:   [id number]  ]

## 2010-12-15 NOTE — Assessment & Plan Note (Signed)
Summary: 3 mth fu/diabetes,cholesterol,high blood pressure/kdc   Vital Signs:  Patient profile:   65 year old male Weight:      283 pounds Pulse rate:   92 / minute BP sitting:   130 / 70  (left arm)  Vitals Entered By: [name] (February 24, 2010 9:22 AM) CC: 3 month roa    History of Present Illness: 65 yo man here today for  1) DM- not checking CBGs.  not able to exercise due to knee arthritis.  reports he's following a low carb diet.  UTD on eye exam.  2) HTN-  BP adequately controlled.  no CP, SOB, HAs, visual changes, edema.  3) R knee replacement- scheduled for 5/11, pt now ambulating w/ walker.  4) Obesity- pt has gained 10 lbs in 1 month.  unable to move w/out difficulty so not able to exercise.  not paying particular attention to diet.  Preventive Screening-Counseling & Management  Caffeine-Diet-Exercise     Does Patient Exercise: no  Current Medications (verified): 1)  Cymbalta 60 Mg Cpep (Duloxetine Hcl) .... Take One Tablet Daily 2)  Enalapril Maleate 2.5 Mg Tabs (Enalapril Maleate) .... Take One Tablet Daily 3)  Clonazepam 0.5 Mg Tabs (Clonazepam) .... Take One Tablet Qid 4)  Simvastatin 40 Mg Tabs (Simvastatin) .... Take One Tablet At Bedtime 5)  Fexofenadine Hcl 180 Mg Tabs (Fexofenadine Hcl) 6)  Actoplus Met 15-500 Mg Tabs (Pioglitazone Hcl-Metformin Hcl) .... Take One Tablet Twice Daily 7)  Aspirin 81 Mg Tbec (Aspirin) .... Take One Tablet Daily 8)  Meclizine Hcl 25 Mg  Tabs (Meclizine Hcl) .... Take One Tablet Daily As Needed For Vertigo 9)  Symbicort 80-4.5 Mcg/act Aero (Budesonide-Formoterol Fumarate) .... Two Puffs Twice Daily 10)  Flonase 50 Mcg/act  Susp (Fluticasone Propionate) .... Use Two Sprays in Each Nostril Twice Daily As Needed. 11)  Ec-Naprosyn 500 Mg Tbec (Naproxen) .[name].. 1 Tab By Mouth Two Times A Day 12)  Saphris 5 Mg Subl (Asenapine Maleate) .[name].. 1 Tablet in Pm 13)  Onetouch Ultra Test  Strp (Glucose Blood) .... Pt Test Once Daily 14)   Lamictal Xr 50 Mg Xr24h-Tab (Lamotrigine) .[name].. 1 By Mouth Daily 15)  Vicodin 5-500 Mg Tabs (Hydrocodone-Acetaminophen) .... Take One Tablet Every Four Hours As Needed 16)  Ambien Cr 12.5 Mg Cr-Tabs (Zolpidem Tartrate) .[name].. 1 By Mouth As Needed 17)  Trazodone Hcl 50 Mg Tabs (Trazodone Hcl) .... 3 By Mouth As Needed 18)  Cogentin 2mg  .... 1 By Mouth Daily 19)  Gabapentin 300 Mg Caps (Gabapentin) .[name].. 1 Qam and 2 Qpm 20)  Veramyst 27.5 Mcg/spray Susp (Fluticasone Furoate) .... As Needed 21)  [facility] Spectravite Senior  Tabs (Multiple Vitamins-Minerals) .[name].. 1 By Mouth Daily  Allergies (verified): 1)  ! Percocet 2)  ! Depakote 3)  ! Celexa 4)  ! Paxil 5)  ! Wellbutrin 6)  ! * Smz 7)  ! * Symbiax 8)  ! * Risperidone  Past History:  Past Medical History: Diabetes mellitus, type II Hyperlipidemia Hypertension Bipolar Allergy shots by Dr Corinda Gubler DJD Obesity Asthma GERD Meniere's disease Normal stress test 3/11  Social History: Does Patient Exercise:  no  Review of Systems      See HPI       The patient complains of weight gain.  The patient denies anorexia, fever, chest pain, syncope, dyspnea on exertion, peripheral edema, prolonged cough, headaches, and abdominal pain.    Physical Exam  General:  Overweight white male in NAD, ambulating w/  walker Head:  Normocephalic and atraumatic without obvious abnormalities. Neck:  No deformities, masses, or tenderness noted. Lungs:  Normal respiratory effort, chest expands symmetrically. Lungs are clear to auscultation, no crackles or wheezes. Heart:  Normal rate and regular rhythm. S1 and S2 normal without gallop, murmur, click, rub or other extra sounds. Pulses:  +2 carotid, radial, DP Extremities:  +2 edema of LEs bilaterally Neurologic:  alert & oriented X3.     Impression & Recommendations:  Problem # 1:  ESSENTIAL HYPERTENSION, BENIGN (ICD-401.1) Assessment Unchanged BP adequately controlled.  continue meds. His updated  medication list for this problem includes:    Enalapril Maleate 2.5 Mg Tabs (Enalapril maleate) .[name]... Take one tablet daily  Problem # 2:  DIABETES MELLITUS, TYPE II (ICD-250.00) Assessment: Unchanged strongly encouraged pt to check CBGs and follow ADA diet.  stressed importance of good control prior to surgery. His updated medication list for this problem includes:    Enalapril Maleate 2.5 Mg Tabs (Enalapril maleate) .[name]... Take one tablet daily    Actoplus Met 15-500 Mg Tabs (Pioglitazone hcl-metformin hcl) .[name]... Take one tablet twice daily    Aspirin 81 Mg Tbec (Aspirin) .[name]... Take one tablet daily  Orders: Venipuncture (42595) TLB-A1C / Hgb A1C (Glycohemoglobin) (83036-A1C)  Problem # 3:  KNEE PAIN, CHRONIC (ICD-719.46) Assessment: Unchanged pt now ambulating w/ a walker, has TKR scheduled for may.  has been cleared for surgery. His updated medication list for this problem includes:    Aspirin 81 Mg Tbec (Aspirin) .[name]... Take one tablet daily    Ec-naprosyn 500 Mg Tbec (Naproxen) .[name]... 1 tab by mouth two times a day    Vicodin 5-500 Mg Tabs (Hydrocodone-acetaminophen) .[name]... Take one tablet every four hours as needed  Problem # 4:  OBESITY (ICD-278.00) Assessment: Deteriorated pt gaining wt- 10 lbs in 1 month.  due to limitations from knee and psych meds pt needs to be even more careful w/ diet.  Pt expresses understanding and is in agreement w/ this plan.  Complete Medication List: 1)  Cymbalta 60 Mg Cpep (Duloxetine hcl) .... Take one tablet daily 2)  Enalapril Maleate 2.5 Mg Tabs (Enalapril maleate) .... Take one tablet daily 3)  Clonazepam 0.5 Mg Tabs (Clonazepam) .... Take one tablet qid 4)  Simvastatin 40 Mg Tabs (Simvastatin) .... Take one tablet at bedtime 5)  Fexofenadine Hcl 180 Mg Tabs (Fexofenadine hcl) 6)  Actoplus Met 15-500 Mg Tabs (Pioglitazone hcl-metformin hcl) .... Take one tablet twice daily 7)  Aspirin 81 Mg Tbec (Aspirin) .... Take one tablet daily 8)   Meclizine Hcl 25 Mg Tabs (Meclizine hcl) .... Take one tablet daily as needed for vertigo 9)  Symbicort 80-4.5 Mcg/act Aero (Budesonide-formoterol fumarate) .... Two puffs twice daily 10)  Flonase 50 Mcg/act Susp (Fluticasone propionate) .... Use two sprays in each nostril twice daily as needed. 11)  Ec-naprosyn 500 Mg Tbec (Naproxen) .[name].. 1 tab by mouth two times a day 12)  Saphris 5 Mg Subl (Asenapine maleate) .[name].. 1 tablet in pm 13)  Onetouch Ultra Test Strp (Glucose blood) .... Pt test once daily 14)  Lamictal Xr 50 Mg Xr24h-tab (Lamotrigine) .[name].. 1 by mouth daily 15)  Vicodin 5-500 Mg Tabs (Hydrocodone-acetaminophen) .... Take one tablet every four hours as needed 16)  Ambien Cr 12.5 Mg Cr-tabs (Zolpidem tartrate) .[name].. 1 by mouth as needed 17)  Trazodone Hcl 50 Mg Tabs (Trazodone hcl) .... 3 by mouth as needed 18)  Cogentin 2mg   .... 1 by mouth daily 19)  Gabapentin 300 Mg Caps (Gabapentin) .[name].. 1 qam and 2 qpm 20)  Veramyst 27.5 Mcg/spray Susp (Fluticasone furoate) .... As needed 21)  [facility] Spectravite Senior Tabs (Multiple vitamins-minerals) .[name].. 1 by mouth daily  Patient Instructions: 1)  Please schedule a follow-up appointment in 3 months for your physical. 2)  Be aware of what you are eating since you can no longer move about- we want to keep those sugars under control 3)  Call with any questions or concerns 4)  Happy Easter! 5)  GOOD LUCK!!!  Prescriptions: EC-NAPROSYN 500 MG TBEC (NAPROXEN) 1 TAB by mouth two times a day  #180 x 3   Entered and Authorized by:   Neena Rhymes MD   Signed by:   Neena Rhymes MD on 02/24/2010   Method used:   Print then Give to Patient   RxID:   [id number] ACTOPLUS MET 15-500 MG TABS (PIOGLITAZONE HCL-METFORMIN HCL) TAKE ONE TABLET TWICE DAILY  #180 x 3   Entered and Authorized by:   Neena Rhymes MD   Signed by:   Neena Rhymes MD on 02/24/2010   Method used:   Print then Give to Patient   RxID:   [id number] ENALAPRIL  MALEATE 2.5 MG TABS (ENALAPRIL MALEATE) TAKE ONE TABLET DAILY  #90 x 3   Entered and Authorized by:   Neena Rhymes MD   Signed by:   Neena Rhymes MD on 02/24/2010   Method used:   Print then Give to Patient   RxID:   [id number] SIMVASTATIN 40 MG TABS (SIMVASTATIN) take one tablet at bedtime  #90 x 3   Entered and Authorized by:   Neena Rhymes MD   Signed by:   Neena Rhymes MD on 02/24/2010   Method used:   Print then Give to Patient   RxID:   [id number]

## 2010-12-15 NOTE — Letter (Signed)
   [facility] [address] [place], [place] [address] [phone]    November 27, 2009   Ryan Zhang [address] [place], [place] [address]  RE:  LAB RESULTS  Dear  [name],  The following is an interpretation of your most recent lab tests.  Please take note of any instructions provided or changes to medications that have resulted from your lab work.  ELECTROLYTES:  Good - no changes needed  KIDNEY FUNCTION TESTS:  Good - no changes needed  LIPID PANEL:  Good - no changes needed Triglyceride: 102.0   Cholesterol: 144   LDL: 79   HDL: 44.90   Chol/HDL%:  3   DIABETIC STUDIES:  Good - no changes needed Blood Glucose: 168   HgbA1C: 6.8   Microalbumin/Creatinine Ratio: ------ mg/g     CBC:  Good - no changes needed  Please keep your follow up appointment as scheduled.   Sincerely Yours,    Lemont Fillers FNP

## 2010-12-15 NOTE — Progress Notes (Signed)
Summary: re for mail order - dr Blossom Hoops  Phone Note Refill Request Call back at [facility] Phone [phone]   Refills Requested: Medication #1:  ACTOPLUS MET 15-500 MG TABS TAKE ONE TABLET TWICE DAILY.  Medication #2:  ADVICOR 500-20 MG TB24 Take 2 tablet by mouth once a day rx for mail order - call pt when ready  Initial call taken by: Okey Regal [place],  November 14, 2007 1:20 PM  Follow-up for Phone Call        Pt. aware to pick up rx up front ...................................................................Ardyth Man  November 14, 2007 2:34 PM  Follow-up by: Ardyth Man,  November 14, 2007 2:34 PM      Prescriptions: ACTOPLUS MET 15-500 MG TABS (PIOGLITAZONE HCL-METFORMIN HCL) TAKE ONE TABLET TWICE DAILY  #180 x 2   Entered by:   Ardyth Man   Authorized by:   Marga Melnick MD   Signed by:   Ardyth Man on 11/14/2007   Method used:   Print then Give to Patient   RxID:   [id number] ADVICOR 500-20 MG TB24 (NIACIN-LOVASTATIN) Take 2 tablet by mouth once a day  #180 x 2   Entered by:   Ardyth Man   Authorized by:   Marga Melnick MD   Signed by:   Ardyth Man on 11/14/2007   Method used:   Print then Give to Patient   RxID:   [id number] ACTOPLUS MET 15-500 MG TABS (PIOGLITAZONE HCL-METFORMIN HCL) TAKE ONE TABLET TWICE DAILY  #180 x 2   Entered by:   Ardyth Man   Authorized by:   Leanne Chang MD   Signed by:   Ardyth Man on 11/14/2007   Method used:   Print then Give to Patient   RxID:   [id number] ADVICOR 500-20 MG TB24 (NIACIN-LOVASTATIN) Take 2 tablet by mouth once a day  #180 x 2   Entered by:   Ardyth Man   Authorized by:   Leanne Chang MD   Signed by:   Ardyth Man on 11/14/2007   Method used:   Print then Give to Patient   RxID:   [id number]

## 2010-12-15 NOTE — Progress Notes (Signed)
Summary: REFILL REQUEST  Phone Note Refill Request Call back at [phone] Message from:  Pharmacy on May 11, 2010 8:36 AM  Refills Requested: Medication #1:  SIMVASTATIN 40 MG TABS take one tablet at bedtime   Dosage confirmed as above?Dosage Confirmed   Supply Requested: 3 months   Last Refilled: 10/25/2009 [facility] DELIVERY  Next Appointment Scheduled: JULY 13TH 2011 Initial call taken by: Lavell Islam,  May 11, 2010 8:37 AM  Follow-up for Phone Call        Pending appointment 05/27/2010 Follow-up by: Shonna Chock,  May 11, 2010 4:06 PM    Prescriptions: SIMVASTATIN 40 MG TABS (SIMVASTATIN) take one tablet at bedtime  #30 x 0   Entered by:   Shonna Chock   Authorized by:   Neena Rhymes MD   Signed by:   Shonna Chock on 05/11/2010   Method used:   Faxed to ...       [facility] (mail-order)             , [place]         Ph: [phone]       Fax: [phone]   RxID:   [id number]

## 2010-12-15 NOTE — Miscellaneous (Signed)
Summary: previsit prep/rm  Clinical Lists Changes  Medications: Added new medication of MOVIPREP 100 GM  SOLR (PEG-KCL-NACL-NASULF-NA ASC-C) As per prep instructions. - Signed Rx of MOVIPREP 100 GM  SOLR (PEG-KCL-NACL-NASULF-NA ASC-C) As per prep instructions.;  #1 x 0;  Signed;  Entered by: Sherren Kerns RN;  Authorized by: Mardella Layman MD [facility];  Method used: Electronically to [facility] [id number]*, [address], [place], [place], [place]  [address], Ph: [phone], Fax: [phone] Observations: Added new observation of ALLERGY REV: Done (08/17/2010 8:43)    Prescriptions: MOVIPREP 100 GM  SOLR (PEG-KCL-NACL-NASULF-NA ASC-C) As per prep instructions.  #1 x 0   Entered by:   Sherren Kerns RN   Authorized by:   Mardella Layman MD [facility]   Signed by:   Sherren Kerns RN on 08/17/2010   Method used:   Electronically to        [facility] [phone]* (retail)       [address]       [place], [place]  [address]       Ph: [phone]       Fax: [phone]   RxID:   [id number]

## 2010-12-15 NOTE — Assessment & Plan Note (Signed)
Summary: roa 3 months,cbs   Vital Signs:  Patient Profile:   65 Years Old Male Weight:      259.13 pounds Temp:     97.7 degrees F oral Pulse rate:   78 / minute Resp:     16 per minute BP sitting:   128 / 80  (right arm)  Pt. in pain?   no  Vitals Entered By: Ardyth Man (October 09, 2007 1:47 PM)                  Chief Complaint:  4 month check up diabetes and Type 2 diabetes mellitus follow-up.  History of Present Illness:  Type 2 Diabetes Mellitus Follow-Up      This is a 65 year old man who presents for Type 2 diabetes mellitus follow-up.  The patient denies the following symptoms: chest pain.  Since the last visit the patient reports good dietary compliance, not exercising regularly, and not monitoring blood glucose.  Since the last visit, the patient reports having had eye care by an ophthalmologist.    Hypertension Follow-Up      The patient also presents for Hypertension follow-up.  Compliance with medications (by patient report) has been near 100%.  The patient reports exercising occasionally.    Hyperlipidemia Follow-Up      The patient also presents for Hyperlipidemia follow-up.  Compliance with medications (by patient report) has been near 100%.          Physical Exam  General:     overweight-appearing.   Neck:     No deformities, masses, or tenderness noted. Lungs:     Normal respiratory effort, chest expands symmetrically. Lungs are clear to auscultation, no crackles or wheezes. Heart:     Normal rate and regular rhythm. S1 and S2 normal without gallop, murmur, click, rub or other extra sounds.  Diabetes Management Exam:    Foot Exam (with socks and/or shoes not present):       Sensory-Pinprick/Light touch:          Left medial foot (L-4): normal          Left dorsal foot (L-5): normal          Left lateral foot (S-1): normal          Right medial foot (L-4): normal          Right dorsal foot (L-5): normal          Right lateral foot  (S-1): normal       Sensory-Monofilament:          Left foot: normal          Right foot: normal       Inspection:          Left foot: normal          Right foot: normal       Nails:          Left foot: normal          Right foot: normal    Impression & Recommendations:  Problem # 1:  DIABETES MELLITUS, TYPE II (ICD-250.00) Previously at goal His updated medication list for this problem includes:    Enalapril Maleate 2.5 Mg Tabs (Enalapril maleate) .[name]... Take one tablet daily    Actoplus Met 15-500 Mg Tabs (Pioglitazone hcl-metformin hcl) .[name]... Take one tablet twice daily  Orders: Venipuncture ([address]454) TLB-A1C / Hgb A1C (Glycohemoglobin) (83036-A1C)  Labs Reviewed: Creat: 1.1 (08/31/2006)      Problem #  2:  HYPERLIPIDEMIA (ICD-272.4) Previously at goal with most recent lipids done  mid year. His updated medication list for this problem includes:    Advicor 500-20 Mg Tb24 (Niacin-lovastatin) .[name]... Take 2 tablet by mouth once a day  Labs Reviewed: Chol: 130 (08/31/2006)   HDL: 48.9 (08/31/2006)   LDL: 69 (08/31/2006)   TG: 59 (08/31/2006) SGOT: 25 (08/31/2006)   SGPT: 19 (08/31/2006)   Complete Medication List: 1)  Cymbalta 60 Mg Cpep (Duloxetine hcl) 2)  Geodon 60 Mg Caps (Ziprasidone hcl) .... Take one capsule daily 3)  Enalapril Maleate 2.5 Mg Tabs (Enalapril maleate) .... Take one tablet daily 4)  Clonazepam 0.5 Mg Tabs (Clonazepam) 5)  Gabapentin 300 Mg Caps (Gabapentin) 6)  Advicor 500-20 Mg Tb24 (Niacin-lovastatin) .... Take 2 tablet by mouth once a day 7)  Benztropine Mesylate 2 Mg Tabs (Benztropine mesylate) 8)  Fexofenadine Hcl 180 Mg Tabs (Fexofenadine hcl) 9)  Actoplus Met 15-500 Mg Tabs (Pioglitazone hcl-metformin hcl) .... Take one tablet twice daily     ]

## 2010-12-15 NOTE — Letter (Signed)
Summary: Results Follow-up Letter  [place] [facility]  [address] [place], [place] [address]   Phone: [phone]  Fax: [phone]    10/11/2007        Ryan Zhang [address] [place], [place]  [address]  Dear [name],   The following are the results of your recent test(s):  Test     Result     Pap Smear    Normal_______  Not Normal_____       Comments: _________________________________________________________ Cholesterol LDL(Bad cholesterol):          Your goal is less than:         HDL (Good cholesterol):        Your goal is more than: _________________________________________________________ Other Tests:   _________________________________________________________  Please call for an appointment Or Please see attached._________________________________________________________ _________________________________________________________ _________________________________________________________  Sincerely,  Ardyth Man [place] at [facility]

## 2010-12-15 NOTE — Assessment & Plan Note (Signed)
Summary: rto 3 months.cbs   Vital Signs:  Patient profile:   65 year old male Height:      70 inches Weight:      249 pounds BMI:     35.86 Pulse rate:   82 / minute BP sitting:   126 / 80  (left arm)  Vitals Entered By: [name] CMA (August 27, 2010 9:46 AM) CC: Diabetes f/u    History of Present Illness: 65 yo man here today for DM.  fasting CBGs 130-150.  currently in PT for recent knee replacement.  exercising some.  denies symptomatic lows.  trying to limit his carb intake.  given new info on actos and relation to bladder cancer will need UA.  denies hematuria, CP, SOB, HAs, visual changes, N/V/D.  Current Medications (verified): 1)  Cymbalta 60 Mg Cpep (Duloxetine Hcl) .... Take One Tablet Daily 2)  Enalapril Maleate 2.5 Mg Tabs (Enalapril Maleate) .... Take One Tablet Daily 3)  Clonazepam 0.5 Mg Tabs (Clonazepam) .... Take One Tablet Qid 4)  Simvastatin 40 Mg Tabs (Simvastatin) .... Take One Tablet At Bedtime 5)  Fexofenadine Hcl 180 Mg Tabs (Fexofenadine Hcl) 6)  Aspirin 81 Mg Tbec (Aspirin) .... Take One Tablet Daily 7)  Meclizine Hcl 25 Mg  Tabs (Meclizine Hcl) .... Take One Tablet Daily As Needed For Vertigo 8)  Symbicort 80-4.5 Mcg/act Aero (Budesonide-Formoterol Fumarate) .... Two Puffs Twice Daily 9)  Flonase 50 Mcg/act  Susp (Fluticasone Propionate) .... Use Two Sprays in Each Nostril Twice Daily As Needed. 10)  Ec-Naprosyn 500 Mg Tbec (Naproxen) .[name].. 1 Tab By Mouth Two Times A Day 11)  Saphris 5 Mg Subl (Asenapine Maleate) .[name].. 1 Tablet in Pm 12)  Onetouch Ultra Test  Strp (Glucose Blood) .... Pt Test Once Daily 13)  Lamictal Xr 50 Mg Xr24h-Tab (Lamotrigine) .[name].. 1 By Mouth Daily 14)  Ambien Cr 12.5 Mg Cr-Tabs (Zolpidem Tartrate) .[name].. 1 By Mouth As Needed 15)  Trazodone Hcl 50 Mg Tabs (Trazodone Hcl) .... 3 By Mouth As Needed 16)  Cogentin 2mg  .... 1 By Mouth Daily 17)  Gabapentin 300 Mg Caps (Gabapentin) .[name].. 1 Qam and 2 Qpm 18)  [facility] Spectravite Senior   Tabs (Multiple Vitamins-Minerals) .[name].. 1 By Mouth Daily 19)  Actoplus Met 15-850 Mg Tabs (Pioglitazone Hcl-Metformin Hcl) .... Take One Tablet Two Times A Day  Allergies (verified): 1)  ! Percocet 2)  ! Depakote 3)  ! Celexa 4)  ! Paxil 5)  ! Wellbutrin 6)  ! * Smz 7)  ! * Symbiax 8)  ! * Risperidone  Past History:  Past Medical History: Last updated: 05/27/2010 Diabetes mellitus, type II Hyperlipidemia Hypertension Bipolar Allergy shots by Dr Corinda Gubler DJD Obesity Asthma GERD Meniere's disease Normal stress test 3/11 Holen Horst Plaque in eye  Review of Systems      See HPI  Physical Exam  General:  Overweight white male in NAD Lungs:  Normal respiratory effort, chest expands symmetrically. Lungs are clear to auscultation, no crackles or wheezes. Heart:  Normal rate and regular rhythm. S1 and S2 normal without gallop, murmur, click, rub or other extra sounds. Pulses:  +2 carotid, radial, DP Extremities:  no edema of LEs bilaterally   Impression & Recommendations:  Problem # 1:  DIABETES MELLITUS, TYPE II (ICD-250.00) Assessment Unchanged pt's fasting CBGs by report still mildly elevated.  will await A1C and adjust meds as needed.  given recent link between Actos and bladder CA will get UA- if hematuria will  refer to Urology.  Pt expresses understanding and is in agreement w/ this plan. His updated medication list for this problem includes:    Enalapril Maleate 2.5 Mg Tabs (Enalapril maleate) .[name]... Take one tablet daily    Aspirin 81 Mg Tbec (Aspirin) .[name]... Take one tablet daily    Actoplus Met 15-850 Mg Tabs (Pioglitazone hcl-metformin hcl) .[name]... Take one tablet two times a day  Orders: Venipuncture ([address]) TLB-A1C / Hgb A1C (Glycohemoglobin) (83036-A1C) TLB-Udip ONLY (81003-UDIP) Specimen Handling (60454)  Complete Medication List: 1)  Cymbalta 60 Mg Cpep (Duloxetine hcl) .... Take one tablet daily 2)  Enalapril Maleate 2.5 Mg Tabs (Enalapril maleate) ....  Take one tablet daily 3)  Clonazepam 0.5 Mg Tabs (Clonazepam) .... Take one tablet qid 4)  Simvastatin 40 Mg Tabs (Simvastatin) .... Take one tablet at bedtime 5)  Fexofenadine Hcl 180 Mg Tabs (Fexofenadine hcl) 6)  Aspirin 81 Mg Tbec (Aspirin) .... Take one tablet daily 7)  Meclizine Hcl 25 Mg Tabs (Meclizine hcl) .... Take one tablet daily as needed for vertigo 8)  Symbicort 80-4.5 Mcg/act Aero (Budesonide-formoterol fumarate) .... Two puffs twice daily 9)  Flonase 50 Mcg/act Susp (Fluticasone propionate) .... Use two sprays in each nostril twice daily as needed. 10)  Ec-naprosyn 500 Mg Tbec (Naproxen) .[name].. 1 tab by mouth two times a day 11)  Saphris 5 Mg Subl (Asenapine maleate) .[name].. 1 tablet in pm 12)  Onetouch Ultra Test Strp (Glucose blood) .... Pt test once daily 13)  Lamictal Xr 50 Mg Xr24h-tab (Lamotrigine) .[name].. 1 by mouth daily 14)  Ambien Cr 12.5 Mg Cr-tabs (Zolpidem tartrate) .[name].. 1 by mouth as needed 15)  Trazodone Hcl 50 Mg Tabs (Trazodone hcl) .... 3 by mouth as needed 16)  Cogentin 2mg   .... 1 by mouth daily 17)  Gabapentin 300 Mg Caps (Gabapentin) .[name].. 1 qam and 2 qpm 18)  Cvs Spectravite Senior Tabs (Multiple vitamins-minerals) .[name].. 1 by mouth daily 19)  Actoplus Met 15-850 Mg Tabs (Pioglitazone hcl-metformin hcl) .... Take one tablet two times a day  Patient Instructions: 1)  Follow up in 3 months to recheck diabetes and cholesterol- do not eat before this appt 2)  Keep up the good work on your exercise! 3)  I'll call you with you lab results and we'll make changes as needed to your meds 4)  Have a great holiday season!!!  Appended Document: rto 3 months.cbs  Laboratory Results   Urine Tests    Routine Urinalysis   Color: straw Appearance: Clear Glucose: negative   (Normal Range: Negative) Bilirubin: negative   (Normal Range: Negative) Ketone: negative   (Normal Range: Negative) Spec. Gravity: >=1.030   (Normal Range: 1.003-1.035) Blood: negative   (Normal  Range: Negative) pH: 6.0   (Normal Range: 5.0-8.0) Protein: trace   (Normal Range: Negative) Urobilinogen: negative   (Normal Range: 0-1) Nitrite: negative   (Normal Range: Negative) Leukocyte Esterace: negative   (Normal Range: Negative)    Comments: Floydene Flock  August 27, 2010 10:08 AM      Appended Document: rto 3 months.cbs     Clinical Lists Changes  Observations: Added new observation of FLU VAX: Historical (08/17/2010 10:37)       Immunization History:  Influenza Immunization History:    Influenza:  historical (08/17/2010)

## 2010-12-15 NOTE — Assessment & Plan Note (Signed)
Summary: ROA & LAB.CBS  Medications Added CYMBALTA 60 MG CPEP (DULOXETINE HCL)  GEODON 80 MG CAPS (ZIPRASIDONE HCL)  ENALAPRIL MALEATE 2.5 MG TABS (ENALAPRIL MALEATE)  CLONAZEPAM 0.5 MG TABS (CLONAZEPAM)  GABAPENTIN 300 MG CAPS (GABAPENTIN)  ADVICOR 500-20 MG TB24 (NIACIN-LOVASTATIN) Take 2 tablet by mouth once a day BENZTROPINE MESYLATE 2 MG TABS (BENZTROPINE MESYLATE)  FEXOFENADINE HCL 180 MG TABS (FEXOFENADINE HCL)  ACTOPLUS MET 15-500 MG TABS (PIOGLITAZONE HCL-METFORMIN HCL)         Vital Signs:  Patient Profile:   65 Years Old Male Weight:      255 pounds Temp:     97.9 degrees F oral Pulse rate:   74 / minute Resp:     16 per minute BP sitting:   110 / 74  (right arm)  Pt. in pain?   no  Vitals Entered By: Ardyth Man (July 14, 2007 8:03 AM)                  Chief Complaint:  Diabetes  and Labs and Type 2 diabetes mellitus follow-up.  History of Present Illness:  Type 2 Diabetes Mellitus Follow-Up      This is a 65 year old man who presents for Type 2 diabetes mellitus follow-up.  The patient denies polyuria, polydipsia, blurred vision, and weight gain.  The patient denies the following symptoms: chest pain, vision loss, and foot ulcer.  Since the last visit the patient reports good dietary compliance and not monitoring blood glucose.  Since the last visit, the patient reports having had eye care by an ophthalmologist.    Hyperlipidemia Follow-Up      The patient also presents for Hyperlipidemia follow-up.  Compliance with medications (by patient report) has been near 100%.  Dietary compliance has been good.          Physical Exam  General:     10lb weight loss Lungs:     Normal respiratory effort, chest expands symmetrically. Lungs are clear to auscultation, no crackles or wheezes. Heart:     Normal rate and regular rhythm. S1 and S2 normal without gallop, murmur, click, rub or other extra sounds.  Diabetes Management Exam:    Foot Exam (with  socks and/or shoes not present):       Sensory-Pinprick/Light touch:          Left medial foot (L-4): normal          Left dorsal foot (L-5): normal          Left lateral foot (S-1): normal          Right medial foot (L-4): normal          Right dorsal foot (L-5): normal          Right lateral foot (S-1): normal       Sensory-Monofilament:          Left foot: normal          Right foot: normal       Inspection:          Left foot: normal          Right foot: normal       Nails:          Left foot: normal          Right foot: normal    Eye Exam:       Eye Exam done elsewhere    Impression & Recommendations:  Problem # 1:  DIABETES MELLITUS,  TYPE II (ICD-250.00) Stable His updated medication list for this problem includes:    Enalapril Maleate 2.5 Mg Tabs (Enalapril maleate)    Actoplus Met 15-500 Mg Tabs (Pioglitazone hcl-metformin hcl)  Orders: TLB-Lipid Panel (80061-LIPID) TLB-AST (SGOT) (84450-SGOT) TLB-ALT (SGPT) (84460-ALT) TLB-BMP (Basic Metabolic Panel-BMET) (80048-METABOL) Venipuncture ([address]) TLB-A1C / Hgb A1C (Glycohemoglobin) (83036-A1C)  Labs Reviewed: Creat: 1.1 (08/31/2006)      Problem # 2:  HYPERLIPIDEMIA (ICD-272.4) Stable Congratulated on lifetstyle changes. F/u in 3 months His updated medication list for this problem includes:    Advicor 500-20 Mg Tb24 (Niacin-lovastatin) .[name]... Take 2 tablet by mouth once a day  Labs Reviewed: Chol: 130 (08/31/2006)   HDL: 48.9 (08/31/2006)   LDL: 69 (08/31/2006)   TG: 59 (08/31/2006) SGOT: 25 (08/31/2006)   SGPT: 19 (08/31/2006)   Complete Medication List: 1)  Cymbalta 60 Mg Cpep (Duloxetine hcl) 2)  Geodon 80 Mg Caps (Ziprasidone hcl) 3)  Enalapril Maleate 2.5 Mg Tabs (Enalapril maleate) 4)  Clonazepam 0.5 Mg Tabs (Clonazepam) 5)  Gabapentin 300 Mg Caps (Gabapentin) 6)  Advicor 500-20 Mg Tb24 (Niacin-lovastatin) .... Take 2 tablet by mouth once a day 7)  Benztropine Mesylate 2 Mg Tabs (Benztropine  mesylate) 8)  Fexofenadine Hcl 180 Mg Tabs (Fexofenadine hcl) 9)  Actoplus Met 15-500 Mg Tabs (Pioglitazone hcl-metformin hcl)

## 2010-12-15 NOTE — Medication Information (Signed)
Summary: Med Issue Order/[facility]  Med Issue Order/[facility]   Imported By: Lanelle Bal 06/30/2010 10:44:00  _____________________________________________________________________  External Attachment:    Type:   Image     Comment:   External Document

## 2010-12-15 NOTE — Assessment & Plan Note (Signed)
Vital Signs:  Patient Profile:   65 Years Old Male Weight:      265.50 pounds Temp:     97.5 degrees F oral Pulse rate:   72 / minute Resp:     16 per minute BP sitting:   110 / 70  Pt. in pain?   no  Vitals Entered By: Leanne Chang MD (Apr 12, 2007 9:39 AM)                Chief Complaint:  Type 2 diabetes mellitus follow-up.  History of Present Illness:  Type 2 Diabetes Mellitus Follow-Up      This is a 65 year old man who presents for Type 2 diabetes mellitus follow-up.  The patient denies polyuria, polydipsia, blurred vision, weight loss, weight gain, and numbness of extremities.  The patient denies the following symptoms: neuropathic pain, chest pain, and vomiting.  Since the last visit the patient reports good dietary compliance, compliance with medications, and not exercising regularly.  The patient has been measuring capillary blood glucose before breakfast.  Since the last visit, the patient reports having had eye care by an ophthalmologist.   Blood sugar in AM average 100-120.  Hyperlipidemia Follow-Up      The patient also presents for Hyperlipidemia follow-up.  The patient denies muscle aches and flushing.  The patient denies the following symptoms: chest pain/pressure and exercise intolerance.  Compliance with medications (by patient report) has been near 100%.  Dietary compliance has been good.  The patient reports no exercise.  Adjunctive measures currently used by the patient include ASA.          Physical Exam  General:     alert, well-hydrated, and overweight-appearing.   Lungs:     Normal respiratory effort, chest expands symmetrically. Lungs are clear to auscultation, no crackles or wheezes. Heart:     Normal rate and regular rhythm. S1 and S2 normal without gallop, murmur, click, rub or other extra sounds.  Diabetes Management Exam:    Foot Exam (with socks and/or shoes not present):       Sensory-Pinprick/Light touch:          Left medial foot  (L-4): normal          Left dorsal foot (L-5): normal          Left lateral foot (S-1): normal          Right medial foot (L-4): normal          Right dorsal foot (L-5): normal          Right lateral foot (S-1): normal       Sensory-Monofilament:          Left foot: normal          Right foot: normal       Inspection:          Left foot: normal          Right foot: normal       Nails:          Left foot: normal          Right foot: normal    Eye Exam:       Eye Exam done elsewhere    Impression & Recommendations:  Problem # 1:  DIABETES MELLITUS, TYPE II (ICD-250.00) Previously at goal. 1. Check HbgA1c, microalbumin 2.Continue ActosPlus 15/500 3.Further recommendation after review  Problem # 2:  HYPERLIPIDEMIA (ICD-272.4) Previously at goal 1.Check lipid, AST, ALT 2.  Continue Advicor 500/20 :2 daily    Patient Instructions: 1)  Please schedule a follow-up appointment in 3 months. 2)  It is important that you exercise regularly at least 20 minutes 5 times a week. If you develop chest pain, have severe difficulty breathing, or feel very tired , stop exercising immediately and seek medical attention.

## 2010-12-15 NOTE — Assessment & Plan Note (Signed)
Summary: np6/abn ekg/surgical clearance  Medications Added LAMICTAL XR 50 MG XR24H-TAB (LAMOTRIGINE) 1 by mouth daily AMBIEN CR 12.5 MG CR-TABS (ZOLPIDEM TARTRATE) 1 by mouth as needed TRAZODONE HCL 50 MG TABS (TRAZODONE HCL) 1 by mouth as needed * COGENTIN 2MG  1 by mouth daily GABAPENTIN 300 MG CAPS (GABAPENTIN) 1 qam and 2 qpm VERAMYST 27.5 MCG/SPRAY SUSP (FLUTICASONE FUROATE) as needed [facility] S[facility]RAV[facility]  TABS (MULTIPLE VITAMINS-MINERALS) 1 by mouth daily      Allergies Added:   Primary Provider:  Neena Rhymes MD   History of Present Illness: Patient is a 65 year old who was referred for preop risk stratification.  He is being evlauated for knee surgery and his EKG was abnormal. The patient has no known history od CAD.  He denies chest pain and signficant shortness of breath.  But his activity is limited by his DJD. No PND.  No palpitations.  Current Medications (verified): 1)  Cymbalta 60 Mg Cpep (Duloxetine Hcl) .... Take One Tablet Daily 2)  Enalapril Maleate 2.5 Mg Tabs (Enalapril Maleate) .... Take One Tablet Daily 3)  Clonazepam 0.5 Mg Tabs (Clonazepam) .... Take One Tablet Qid 4)  Simvastatin 40 Mg Tabs (Simvastatin) .... Take One Tablet At Bedtime 5)  Fexofenadine Hcl 180 Mg Tabs (Fexofenadine Hcl) 6)  Actoplus Met 15-500 Mg Tabs (Pioglitazone Hcl-Metformin Hcl) .... Take One Tablet Twice Daily 7)  Aspirin 81 Mg Tbec (Aspirin) .... Take One Tablet Daily 8)  Meclizine Hcl 25 Mg  Tabs (Meclizine Hcl) .... Take One Tablet Daily As Needed For Vertigo 9)  Symbicort 80-4.5 Mcg/act Aero (Budesonide-Formoterol Fumarate) .... Two Puffs Twice Daily 10)  Flonase 50 Mcg/act  Susp (Fluticasone Propionate) .... Use Two Sprays in Each Nostril Twice Daily As Needed. 11)  Ec-Naprosyn 500 Mg Tbec (Naproxen) .[name].. 1 Tab By Mouth Two Times A Day 12)  Saphris 5 Mg Subl (Asenapine Maleate) .[name].. 1 Tablet in Pm 13)  Onetouch Ultra Test  Strp (Glucose Blood) .... Pt Test Once Daily 14)   Lamictal Xr 50 Mg Xr24h-Tab (Lamotrigine) .[name].. 1 By Mouth Daily 15)  Vicodin 5-500 Mg Tabs (Hydrocodone-Acetaminophen) .... Take One Tablet Every Four Hours As Needed 16)  Ambien Cr 12.5 Mg Cr-Tabs (Zolpidem Tartrate) .[name].. 1 By Mouth As Needed 17)  Trazodone Hcl 50 Mg Tabs (Trazodone Hcl) .[name].. 1 By Mouth As Needed 18)  Cogentin 2mg  .... 1 By Mouth Daily 19)  Gabapentin 300 Mg Caps (Gabapentin) .[name].. 1 Qam and 2 Qpm 20)  Veramyst 27.5 Mcg/spray Susp (Fluticasone Furoate) .... As Needed 21)  [facility]  Tabs (Multiple Vitamins-Minerals) .[name].. 1 By Mouth Daily  Allergies (verified): 1)  ! Percocet 2)  ! Depakote 3)  ! Celexa 4)  ! Paxil 5)  ! Wellbutrin 6)  ! * Smz 7)  ! * Symbiax 8)  ! * Risperidone  Past History:  Past Surgical History: Last updated: 02/22/2007 Cholecystectomy  Family History: Last updated: 01/10/2008 DM Breast Cancer  Social History: Last updated: 01/15/2010 Occupation: disability Married Never Smoked Alcohol use-no Drug Use - no  Past Medical History: Diabetes mellitus, type II Hyperlipidemia Hypertension Bipolar Allergy shots by Dr Corinda Gubler DJD Obesity Asthma GERD Meniere's disease  Vital Signs:  Patient profile:   65 year old male Height:      69 inches Weight:      279 pounds BMI:     41.35 Pulse rate:   85 / minute Resp:     18 per minute BP sitting:  137 / 78  (left arm)  Vitals Entered By: Marrion Coy, CNA (January 16, 2010 4:34 PM)  Physical Exam  Additional Exam:  Obese 65 year old in NAD HEENT:  Normocephalic, atraumatic. EOMI, PERRLA.  Neck: JVP is normal. No thyromegaly. No bruits.  Lungs: clear to auscultation. No rales no wheezes.  Heart: Regular rate and rhythm. Normal S1, S2. No S3.   No significant murmurs. PMI not displaced.  Abdomen:  Supple, nontender. Normal bowel sounds. No masses. No hepatomegaly.  Extremities:   Good distal pulses throughout. No lower extremity edema.  Musculoskeletal :moving all  extremities.  Neuro:   alert and oriented x3.    EKG  Procedure date:  01/14/2010  Findings:      NSR.  92 bpm.  Incomp RBBB.  T wav inveriosn V1-V4; III.  Impression & Recommendations:  Problem # 1:  ABNORMAL ELECTROCARDIOGRAM (ICD-794.31) Patient has multilple risks for CAD.  He is not that active.  EKG is abnormal but nondiagonostic. I would recommend a dobutamine myoview and an echo to evaluate his cardiac status prior to surgery. Should continue ASA  Problem # 2:  HYPERLIPIDEMIA (ICD-272.4) Continue. His updated medication list for this problem includes:    Simvastatin 40 Mg Tabs (Simvastatin) .[name]... Take one tablet at bedtime  Problem # 3:  ESSENTIAL HYPERTENSION, BENIGN (ICD-401.1) Coninue meds.  Adequate control today. His updated medication list for this problem includes:    Enalapril Maleate 2.5 Mg Tabs (Enalapril maleate) .[name]... Take one tablet daily    Aspirin 81 Mg Tbec (Aspirin) .[name]... Take one tablet daily  Other Orders: Echocardiogram (Echo) Nuclear Stress Test (Nuc Stress Test)  Patient Instructions: 1)  Your physician has requested that you have an echocardiogram.  Echocardiography is a painless test that uses sound waves to create images of your heart. It provides your doctor with information about the size and shape of your heart and how well your heart's chambers and valves are working.  This procedure takes approximately one hour. There are no restrictions for this procedure. 2)  Your physician has requested that you have a dobutamine myoview.  For further information please visit [email or web address]  Please follow instruction sheet, as given.

## 2010-12-15 NOTE — Progress Notes (Signed)
Summary: max qty allowed //[facility] [facility]  Phone Note From Pharmacy   Caller: [employer] [facility] Summary of Call: per [employer]  IF PT TEST ONCE DAILY MAX ALLOWED IS 100 PER 90 DAYS, GAVE OK  FOR MAXIMUM QTY ALLOWED .Kandice Hams  January 21, 2009 3:12 PM  Initial call taken by: Kandice Hams,  January 21, 2009 3:12 PM      Prescriptions: Koren Bound TEST  STRP (GLUCOSE BLOOD) pt test once daily  #100 x 0   Entered by:   Kandice Hams   Authorized by:   Neena Rhymes MD   Signed by:   Kandice Hams on 01/21/2009   Method used:   Reprint   RxID:   [id number]

## 2010-12-15 NOTE — Progress Notes (Signed)
Summary: actosplus met   Phone Note Refill Request Message from:  Patient on May 29, 2010 4:06 PM  Refills Requested: Medication #1:  ACTOPLUS MET 15-500 MG TABS TAKE ONE TABLET TWICE DAILY   Dosage confirmed as above?Dosage Confirmed   Supply Requested: 1 month [facility], not sent to [facility]  Initial call taken by: Harold Barban,  May 29, 2010 4:07 PM    Prescriptions: ACTOPLUS MET 15-500 MG TABS (PIOGLITAZONE HCL-METFORMIN HCL) TAKE ONE TABLET TWICE DAILY  #180 x 3   Entered by:   [name] CMA   Authorized by:   Neena Rhymes MD   Signed by:   [name] CMA on 05/29/2010   Method used:   Electronically to        [facility] [phone]* (retail)       [address]       [place], [place]  [address]       Ph: [phone]       Fax: [phone]   RxID:   [id number]

## 2010-12-15 NOTE — Letter (Signed)
Summary: Results Follow-up Letter  [place] at [facility]  [address] [place], [place] [address]   Phone: [phone]  Fax: [phone]    01/17/2008        Ryan Zhang [address] [place], [place]  [address]  Dear [name],   The following are the results of your recent test(s):  Test     Result     Pap Smear    Normal_______  Not Normal_____       Comments: _________________________________________________________ Cholesterol LDL(Bad cholesterol):          Your goal is less than:         HDL (Good cholesterol):        Your goal is more than: _________________________________________________________ Other Tests:   _________________________________________________________  Please call for an appointment Or Please see attached._________________________________________________________ _________________________________________________________ _________________________________________________________  Sincerely,  Ryan Zhang at [facility]

## 2010-12-15 NOTE — Progress Notes (Signed)
  Phone Note Outgoing Call Call back at [facility] Phone [phone]   Call placed by: Ardyth Man,  July 20, 2007 2:11 PM Call placed to: Patient Summary of Call: Pt. aware ...................................................................Ardyth Man  July 20, 2007 2:11 PM

## 2010-12-15 NOTE — Progress Notes (Signed)
Summary: one touch test strips local and mailorder  Phone Note Refill Request Message from:  Patient  need one touch ultra test strips for 30 days to [facility]  Initial call taken by: Kandice Hams,  January 08, 2009 10:47 AM  Follow-up for Phone Call        pt [facility] and pt wants to pickup mailorder rx which is ready .Kandice Hams  January 08, 2009 10:56 AM  Follow-up by: Kandice Hams,  January 08, 2009 10:56 AM    New/Updated Medications: ONETOUCH ULTRA TEST  STRP (GLUCOSE BLOOD) pt test once daily   Prescriptions: ONETOUCH ULTRA TEST  STRP (GLUCOSE BLOOD) pt test once daily  #300 x 0   Entered by:   Kandice Hams   Authorized by:   Neena Rhymes MD   Signed by:   Kandice Hams on 01/08/2009   Method used:   Print then Give to Patient   RxID:   [id number] ONETOUCH ULTRA TEST  STRP (GLUCOSE BLOOD) pt test once daily  #50 x 0   Entered by:   Kandice Hams   Authorized by:   Neena Rhymes MD   Signed by:   Kandice Hams on 01/08/2009   Method used:   Faxed to ...       [facility] [phone]* (retail)       [address]       [place], [place]  [address]       Ph: [phone]       Fax: [phone]   RxID:   [id number]

## 2010-12-15 NOTE — Progress Notes (Signed)
Summary: naprosyn rx//[facility]  Phone Note Refill Request Call back at Home Phone [phone] Message from:  Patient  Refills Requested: Medication #1:  EC-NAPROSYN 500 MG TBEC 1 TAB by mouth two times a day last ov 07/22/09 last refill #180 x 2 on 12/11/08  Initial call taken by: Kandice Hams,  August 06, 2009 12:07 PM  Follow-up for Phone Call        ok for #180 w/ 3 refills Follow-up by: Neena Rhymes MD,  August 06, 2009 3:51 PM  Additional Follow-up for Phone Call Additional follow up Details #1::        PT WANTS TO PICKUP RX HE SENDS TO HIS [facility] RX REPRINTED Additional Follow-up by: Kandice Hams,  August 06, 2009 4:03 PM    Prescriptions: EC-NAPROSYN 500 MG TBEC (NAPROXEN) 1 TAB by mouth two times a day  #180 x 3   Entered by:   Kandice Hams   Authorized by:   Neena Rhymes MD   Signed by:   Kandice Hams on 08/06/2009   Method used:   Reprint   RxID:   [id number] EC-NAPROSYN 500 MG TBEC (NAPROXEN) 1 TAB by mouth two times a day  #180 x 3   Entered by:   Kandice Hams   Authorized by:   Neena Rhymes MD   Signed by:   Kandice Hams on 08/06/2009   Method used:   Faxed to ...       [facility] [phone]* (retail)       [address]       [place], [place]  [address]       Ph: [phone]       Fax: [phone]   RxID:   [id number]

## 2010-12-15 NOTE — Progress Notes (Signed)
Summary: onetouch refill   Phone Note Refill Request Message from:  Fax from Pharmacy on July 06, 2010 11:50 AM  Refills Requested: Medication #1:  Koren Bound TEST  STRP pt test once daily [employer] home delivery - fax [phone]  Initial call taken by: Okey Regal [place],  July 06, 2010 11:53 AM    Prescriptions: Koren Bound TEST  STRP (GLUCOSE BLOOD) pt test once daily  #100 x 2   Entered by:   [name] CMA   Authorized by:   Neena Rhymes MD   Signed by:   [name] CMA on 07/06/2010   Method used:   Faxed to ...       [facility] (mail-order)             , [place]         Ph: [phone]       Fax: [phone]   RxID:   [id number]

## 2010-12-15 NOTE — Progress Notes (Signed)
Summary: enalapril refill   Phone Note Refill Request Message from:  Fax from Pharmacy on November 24, 2009 8:54 AM  Refills Requested: Medication #1:  ENALAPRIL MALEATE 2.5 MG TABS TAKE ONE TABLET DAILY Initial call taken by: [name],  November 24, 2009 8:54 AM    Prescriptions: ENALAPRIL MALEATE 2.5 MG TABS (ENALAPRIL MALEATE) TAKE ONE TABLET DAILY  #90 x 3   Entered by:   [name]   Authorized by:   Neena Rhymes MD   Signed by:   [name] on 11/24/2009   Method used:   Faxed to ...       [facility] (mail-order)             , [place]         Ph: [phone]       Fax: [phone]   RxID:   [id number]

## 2010-12-15 NOTE — Consult Note (Signed)
Summary: [facility]   Imported By: Lanelle Bal 06/12/2009 10:55:11  _____________________________________________________________________  External Attachment:    Type:   Image     Comment:   External Document

## 2010-12-15 NOTE — Miscellaneous (Signed)
   Clinical Lists Changes  Observations: Added new observation of TD BOOSTER: Historical (07/20/2010 13:35)      Immunization History:  Tetanus/Td Immunization History:    Tetanus/Td:  historical (07/20/2010)

## 2010-12-15 NOTE — Progress Notes (Signed)
Summary: [facility]  Phone Note Outgoing Call   Initial call taken by: Lucious Groves CMA,  May 28, 2010 3:29 PM    New/Updated Medications: ACTOPLUS MET 15-850 MG TABS (PIOGLITAZONE HCL-METFORMIN HCL) 1 by mouth bid Prescriptions: ACTOPLUS MET 15-850 MG TABS (PIOGLITAZONE HCL-METFORMIN HCL) 1 by mouth bid  #180 x 0   Entered by:   Lucious Groves CMA   Authorized by:   Neena Rhymes MD   Signed by:   Lucious Groves CMA on 05/28/2010   Method used:   Faxed to ...       [facility] (mail-order)             , [place]         Ph: [phone]       Fax: [phone]   RxID:   [id number]

## 2010-12-15 NOTE — Progress Notes (Signed)
Summary: Schedule Colonoscopy   Phone Note Outgoing Call Call back at Home Phone [phone]   Call placed by: Harlow Mares CMA Duncan Dull),  June 12, 2010 4:14 PM Call placed to: Patient Summary of Call: spoke to the patient he is recovering from knee surgery and he will want to have his colonoscopy done in october. That schedule is not out yet but I will send myself a reminder flag and call him back when the october schedule comes out. patient last seen Dr. Corinda Gubler and has seen noone since he can schedule his colonoscopy with anyone Dr. Jarold Motto reviewed his chart for recall.  Initial call taken by: Harlow Mares CMA Duncan Dull),  June 12, 2010 4:15 PM     Appended Document: Schedule Colonoscopy spoke to the patinets wife and she states that the patient just had her other knee operated on and is recovering from this and she will have him call back to schedule his colonoscopy

## 2010-12-15 NOTE — Progress Notes (Signed)
  Phone Note Call from Patient Call back at Home Phone [phone]   Caller: Patient Summary of Call: pt called and left msg in ref to Naprosyn rx  he takes 2 two times a day and #90 is a 45 day dose need correct rx fo full dose.  Called pt back informed rx for #60 has been faxed to [facility] and he has rx for #180 pt agreed  Initial call taken by: Kandice Hams,  December 11, 2008 2:45 PM

## 2010-12-15 NOTE — Assessment & Plan Note (Signed)
Summary: 3 MTH FU/FOR DIABETES/NS/KDC   Vital Signs:  Patient profile:   65 year old male Height:      69 inches Weight:      286 pounds BMI:     42.39 O2 Sat:      91 % on Room air Pulse rate:   80 / minute BP sitting:   132 / 80  (right arm)  Vitals Entered By: [name] (November 26, 2009 10:08 AM)  O2 Flow:  Room air CC: 3 month roa and refill on meds    Primary Care Provider:  Neena Rhymes MD  CC:  3 month roa and refill on meds .  History of Present Illness: Ryan Zhang is a 65 year old male who presents today for a follow up visit.  Needs refill on his medicines.  Patient notes that he had a cold 2 1/2 months ago- which resolved.  Then developed cold 1 month ago after being exposed to his sick grand-daughter.  Patient went to urgent care on Sunday and was given a prednisone taper.  No antibiotics.  Notes that he is feeling better, denies fever, has dry cough.  Denies sinus pressure.  Diabetes- sugars prior to prednisone 150-200 fasting then down to 150 after taking AM meds.  Has not been checking sugar since he started prednisone.    Allergies: 1)  ! Percocet 2)  ! Depakote 3)  ! Celexa 4)  ! Paxil 5)  ! Wellbutrin 6)  ! * Smz 7)  ! * Symbiax  Review of Systems       + wheezing, denies fever.    Physical Exam  General:  Well-developed,well-nourished,in no acute distress; alert,appropriate and cooperative throughout examination Neck:  No deformities, masses, or tenderness noted. Lungs:  loud bilateral expiratory wheeze.  No increased work of breathing.  No crackles.  Fair air movement to bases.   Heart:  Normal rate and regular rhythm. S1 and S2 normal without gallop, murmur, click, rub or other extra sounds.   Impression & Recommendations:  Problem # 1:  ASTHMA, WITH ACUTE EXACERBATION (ICD-493.92) Assessment Improved Patient tells me that he is improving-  I hear significant wheezing on exam.  Patient completed CXR today which notes atx but no  pneumonia.  Will have patient resume symbicort, continue proventil, add empiric zithromax for any associated bronchitis.  Patient given neb in office and instructed to follow up with Dr. Corinda Gubler before the end of the week for his asthma.   His updated medication list for this problem includes:    Symbicort 80-4.5 Mcg/act Aero (Budesonide-formoterol fumarate) .[name]..[name] Two puffs twice daily  Orders: T-2 View CXR (71020TC)  Problem # 2:  DIABETES MELLITUS, TYPE II (ICD-250.00) Assessment: Comment Only Patient had normal eye exam 6 months ago.  Never been to seen podiatry- will refer.  Tells me that he is unable to provide urine specimen today for microalbumin- wants to give next visit.  Plan to check A1C. Suspect sugar to be up today due to prednisone taper.   His updated medication list for this problem includes:    Enalapril Maleate 2.5 Mg Tabs (Enalapril maleate) .[name]... Take one tablet daily    Actoplus Met 15-500 Mg Tabs (Pioglitazone hcl-metformin hcl) .[name]... Take one tablet twice daily    Aspirin 81 Mg Tbec (Aspirin) .[name]... Take one tablet daily  Orders: Fingerstick ([address]) TLB-BMP (Basic Metabolic Panel-BMET) (80048-METABOL) T-Hgb A1C ([address]-09811) Podiatry Referral (Podiatry)  Problem # 3:  HYPERLIPIDEMIA (ICD-272.4) Assessment: Comment Only Check  FLP and liver, cont statin.   His updated medication list for this problem includes:    Simvastatin 40 Mg Tabs (Simvastatin) .[name]... Take one tablet at bedtime  Orders: TLB-Hepatic/Liver Function Pnl (80076-HEPATIC) TLB-Lipid Panel (80061-LIPID)  Problem # 4:  ELEVATED BP W/O HYPERTENSION (ICD-796.2) Assessment: Comment Only BP control is reasonable, continue current dose of enalapril.  If SBP in the 130's next visit, consider increasing Enalapril.   His updated medication list for this problem includes:    Enalapril Maleate 2.5 Mg Tabs (Enalapril maleate) .[name]... Take one tablet daily  BP today: 132/80 Prior BP: 114/68 (10/14/2009)  Labs  Reviewed: Creat: 1.0 (02/20/2009) Chol: 139 (08/22/2009)   HDL: 44.80 (08/22/2009)   LDL: 83 (08/22/2009)   TG: 54.0 (08/22/2009)  Instructed in low sodium diet (DASH Handout) and behavior modification.    Complete Medication List: 1)  Cymbalta 60 Mg Cpep (Duloxetine hcl) .... Take one tablet daily 2)  Enalapril Maleate 2.5 Mg Tabs (Enalapril maleate) .... Take one tablet daily 3)  Clonazepam 0.5 Mg Tabs (Clonazepam) .... Take one tablet qid 4)  Simvastatin 40 Mg Tabs (Simvastatin) .... Take one tablet at bedtime 5)  Fexofenadine Hcl 180 Mg Tabs (Fexofenadine hcl) 6)  Actoplus Met 15-500 Mg Tabs (Pioglitazone hcl-metformin hcl) .... Take one tablet twice daily 7)  Aspirin 81 Mg Tbec (Aspirin) .... Take one tablet daily 8)  Meclizine Hcl 25 Mg Tabs (Meclizine hcl) .... Take one tablet daily as needed for vertigo 9)  Symbicort 80-4.5 Mcg/act Aero (Budesonide-formoterol fumarate) .... Two puffs twice daily 10)  Flonase 50 Mcg/act Susp (Fluticasone propionate) .... Use two sprays in each nostril twice daily as needed. 11)  Ec-naprosyn 500 Mg Tbec (Naproxen) .[name].. 1 tab by mouth two times a day 12)  Saphris 5 Mg Subl (Asenapine maleate) .[name].. 1 tablet in pm 13)  Onetouch Ultra Test Strp (Glucose blood) .... Pt test once daily 14)  Lamictal 25 Mg Tabs (Lamotrigine) .... Take one tablet bedtime 15)  Azithromycin 250 Mg Tabs (Azithromycin) .... As per pack 16)  Zithromax Z-pak 250 Mg Tabs (Azithromycin) .... Two tablets by mouth today, then one tablet by mouth daily x 4 more days  Patient Instructions: 1)  Please complete your Chest X-Ray today. 2)  Arrange follow up with Dr. Corinda Gubler this week. 3)  Please start Z-pack, and resume your symbicort. 4)  Complete your labs before leaving.   5)  Please schedule a follow-up appointment in 3 months for your diabetes, cholesterol, high blood pressure. Prescriptions: EC-NAPROSYN 500 MG TBEC (NAPROXEN) 1 TAB by mouth two times a day  #180 x 0   Entered  and Authorized by:   Lemont Fillers FNP   Signed by:   Lemont Fillers FNP on 11/26/2009   Method used:   Print then Give to Patient   RxID:   [id number] ACTOPLUS MET 15-500 MG TABS (PIOGLITAZONE HCL-METFORMIN HCL) TAKE ONE TABLET TWICE DAILY  #180 x 0   Entered and Authorized by:   Lemont Fillers FNP   Signed by:   Lemont Fillers FNP on 11/26/2009   Method used:   Print then Give to Patient   RxID:   [id number] SIMVASTATIN 40 MG TABS (SIMVASTATIN) take one tablet at bedtime  #90 x 0   Entered and Authorized by:   Lemont Fillers FNP   Signed by:   Lemont Fillers FNP on 11/26/2009   Method used:   Print then Give to Patient   RxID:  [id number] ENALAPRIL MALEATE 2.5 MG TABS (ENALAPRIL MALEATE) TAKE ONE TABLET DAILY  #90 x 0   Entered and Authorized by:   Lemont Fillers FNP   Signed by:   Lemont Fillers FNP on 11/26/2009   Method used:   Print then Give to Patient   RxID:   [id number] ZITHROMAX Z-PAK 250 MG TABS (AZITHROMYCIN) two tablets by mouth today, then one tablet by mouth daily x 4 more days  #1 x 0   Entered and Authorized by:   Lemont Fillers FNP   Signed by:   Lemont Fillers FNP on 11/26/2009   Method used:   [facility] to        [facility] [phone]* (retail)       [address]       [place], [place]  [address]       Ph: [phone]       Fax: [phone]   RxID:   [id number]

## 2010-12-15 NOTE — Letter (Signed)
Summary: [facility]   Imported By: Lennie Odor 06/08/2010 13:26:21  _____________________________________________________________________  External Attachment:    Type:   Image     Comment:   External Document

## 2010-12-15 NOTE — Assessment & Plan Note (Signed)
Summary: still has cold//lch   Vital Signs:  Patient profile:   65 year old male Weight:      252 pounds BMI:     36.29 O2 Sat:      97 % on Room air Temp:     98.1 degrees F oral Pulse rate:   99 / minute BP sitting:   120 / 80  (right arm)  Vitals Entered By: [name] CMA (September 04, 2010 1:58 PM)  O2 Flow:  Room air CC: still w/ cough and wheezing    History of Present Illness: 65 yo man here today w/ continued cough and worsening wheezing.  'i was wheezing a lot when i called'.  sxs have improved since pt has been up and moving around.  'i guess the symbicort kicked in'.  taking Zpack as directed.  has albuterol inhaler as needed.  denies SOB.  cough is improving.  Current Medications (verified): 1)  Cymbalta 60 Mg Cpep (Duloxetine Hcl) .... Take One Tablet Daily 2)  Enalapril Maleate 2.5 Mg Tabs (Enalapril Maleate) .... Take One Tablet Daily 3)  Clonazepam 0.5 Mg Tabs (Clonazepam) .... Take One Tablet Qid 4)  Simvastatin 40 Mg Tabs (Simvastatin) .... Take One Tablet At Bedtime 5)  Fexofenadine Hcl 180 Mg Tabs (Fexofenadine Hcl) 6)  Aspirin 81 Mg Tbec (Aspirin) .... Take One Tablet Daily 7)  Meclizine Hcl 25 Mg  Tabs (Meclizine Hcl) .... Take One Tablet Daily As Needed For Vertigo 8)  Symbicort 80-4.5 Mcg/act Aero (Budesonide-Formoterol Fumarate) .... Two Puffs Twice Daily 9)  Flonase 50 Mcg/act  Susp (Fluticasone Propionate) .... Use Two Sprays in Each Nostril Twice Daily As Needed. 10)  Ec-Naprosyn 500 Mg Tbec (Naproxen) .[name].. 1 Tab By Mouth Two Times As Needed 11)  Saphris 5 Mg Subl (Asenapine Maleate) .[name].. 1 Tablet in Pm 12)  Onetouch Ultra Test  Strp (Glucose Blood) .... Pt Test Once Daily 13)  Lamictal Xr 50 Mg Xr24h-Tab (Lamotrigine) .[name].. 1 By Mouth Daily 14)  Ambien Cr 12.5 Mg Cr-Tabs (Zolpidem Tartrate) .[name].. 1 By Mouth As Needed 15)  Trazodone Hcl 50 Mg Tabs (Trazodone Hcl) .... 3 By Mouth As Needed 16)  Cogentin 2mg  .... 1 By Mouth Daily 17)  Gabapentin 300  Mg Caps (Gabapentin) .[name].. 1 Qam and 2 Qpm 18)  [facility] Spectravite Senior  Tabs (Multiple Vitamins-Minerals) .[name].. 1 By Mouth Daily 19)  Actoplus Met 15-850 Mg Tabs (Pioglitazone Hcl-Metformin Hcl) .... Take One Tablet Two Times A Day 20)  Azithromycin 250 Mg  Tabs (Azithromycin) .... 2 By  Mouth Today and Then 1 Daily For 4 Days 21)  Tessalon 200 Mg Caps (Benzonatate) .... Take One Capsule By Mouth Three Times A Day As Needed For Cough  Allergies (verified): 1)  ! Percocet 2)  ! Depakote 3)  ! Celexa 4)  ! Paxil 5)  ! Wellbutrin 6)  ! * Smz 7)  ! * Symbiax 8)  ! 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 S1 and S2 normal without gallop, murmur, click, rub or other extra sounds.   Impression & Recommendations:  Problem #  1:  ASTHMA, WITH ACUTE EXACERBATION (ICD-493.92) Assessment Unchanged pt w/ end expiratory wheezes.  mild.  no need for steroids.  should use Symbicort regularly until feeling better.  schedule albuterol this weekend.  reviewed supportive care and red flags that should prompt return.  Pt expresses understanding and is in agreement w/ this plan. His updated medication list for this problem includes:    Symbicort 80-4.5 Mcg/act Aero (Budesonide-formoterol fumarate) .[name]..[name] Two puffs twice daily  Complete Medication List: 1)  Cymbalta 60 Mg Cpep (Duloxetine hcl) .... Take one tablet daily 2)  Enalapril Maleate 2.5 Mg Tabs (Enalapril maleate) .... Take one tablet daily 3)  Clonazepam 0.5 Mg Tabs (Clonazepam) .... Take one tablet qid 4)  Simvastatin 40 Mg Tabs (Simvastatin) .... Take one  tablet at bedtime 5)  Fexofenadine Hcl 180 Mg Tabs (Fexofenadine hcl) 6)  Aspirin 81 Mg Tbec (Aspirin) .... Take one tablet daily 7)  Meclizine Hcl 25 Mg Tabs (Meclizine hcl) .... Take one tablet daily as needed for vertigo 8)  Symbicort 80-4.5 Mcg/act Aero (Budesonide-formoterol fumarate) .... Two puffs twice daily 9)  Flonase 50 Mcg/act Susp (Fluticasone propionate) .... Use two sprays in each nostril twice daily as needed. 10)  Ec-naprosyn 500 Mg Tbec (Naproxen) .[name].. 1 tab by mouth two times as needed 11)  Saphris 5 Mg Subl (Asenapine maleate) .[name].. 1 tablet in pm 12)  Onetouch Ultra Test Strp (Glucose blood) .... Pt test once daily 13)  Lamictal Xr 50 Mg Xr24h-tab (Lamotrigine) .[name].. 1 by mouth daily 14)  Ambien Cr 12.5 Mg Cr-tabs (Zolpidem tartrate) .[name].. 1 by mouth as needed 15)  Trazodone Hcl 50 Mg Tabs (Trazodone hcl) .... 3 by mouth as needed 16)  Cogentin 2mg   .... 1 by mouth daily 17)  Gabapentin 300 Mg Caps (Gabapentin) .[name].. 1 qam and 2 qpm 18)  [facility]vs Spectravite Senior Tabs (Multiple vitamins-minerals) .[name].. 1 by mouth daily 19)  Actoplus Met 15-850 Mg Tabs (Pioglitazone hcl-metformin hcl) .... Take one tablet two times a day 20)  Azithromycin 250 Mg Tabs (Azithromycin) .... 2 by  mouth today and then 1 daily for 4 days 21)  Tessalon 200 Mg Caps (Benzonatate) .... Take one capsule by mouth three times a day as needed for cough  Patient Instructions: 1)  Finish the Azithromycin as directed 2)  Continue the Tessalon as needed for cough 3)  Use the Symbicort daily- at least until feeling better 4)  Use the Albuterol pretty regularly (every 4-6 hrs) for the next 2-3 days 5)  Call with any questions or concerns 6)  Have a great weekend!   Orders Added: 1)  Est. Patient Level III [16109]

## 2010-12-15 NOTE — Letter (Signed)
Summary: Diabetic Instructions  [facility]  [address] [place], [place] [address]   Phone: [phone]  Fax: [phone]    Ryan Zhang 11/25/1945 MRN: [id number]   _ x _   ORAL DIABETIC MEDICATION INSTRUCTIONS  The day before your procedure:   Take your diabetic pill as you do normally  The day of your procedure:   Do not take your diabetic pill    We will check your blood sugar levels during the admission process and again in Recovery before discharging you home  ________________________________________________________________________

## 2010-12-15 NOTE — Assessment & Plan Note (Signed)
Summary: COLD CAUSING PROBLEMS W/ASTHMA/RH.....   Vital Signs:  Patient profile:   65 year old male Weight:      260.2 pounds O2 Sat:      92 % on Room air Temp:     98.0 degrees F oral Pulse rate:   89 / minute BP sitting:   120 / 76  (left arm)  Vitals Entered By: [name] (July 22, 2009 11:33 AM)  O2 Flow:  Room air CC: cough and congestion xwed. and doing some wheezing    History of Present Illness: 65 yo man here today for cough and congestion.  sxs started 6 days ago.  cough is productive.  no fevers.  increased wheezing.  no ear pain.  + nasal drainage and congestion.  no facial pain.  no known sick contacts.  using Qvar w/out relief.  has not had albuterol inhaler recently.    Current Medications (verified): 1)  Cymbalta 60 Mg Cpep (Duloxetine Hcl) .... Take One Tablet Daily 2)  Enalapril Maleate 2.5 Mg Tabs (Enalapril Maleate) .... Take One Tablet Daily 3)  Clonazepam 0.5 Mg Tabs (Clonazepam) .... Take One Tablet Qid 4)  Simvastatin 40 Mg Tabs (Simvastatin) .... Take One Tablet At Bedtime 5)  Fexofenadine Hcl 180 Mg Tabs (Fexofenadine Hcl) 6)  Actoplus Met 15-500 Mg Tabs (Pioglitazone Hcl-Metformin Hcl) .... Take One Tablet Twice Daily 7)  Aspirin 81 Mg Tbec (Aspirin) .... Take One Tablet Daily 8)  Meclizine Hcl 25 Mg  Tabs (Meclizine Hcl) .... Take One Tablet Daily As Needed For Vertigo 9)  Qvar 40 Mcg/act  Aers (Beclomethasone Dipropionate) .... Use 2 Puffs As Needed in The Morning 10)  Flonase 50 Mcg/act  Susp (Fluticasone Propionate) .... Use Two Sprays in Each Nostril Twice Daily As Needed. 11)  Ec-Naprosyn 500 Mg Tbec (Naproxen) .[name].. 1 Tab By Mouth Two Times A Day 12)  Saphris 5 Mg Subl (Asenapine Maleate) .[name].. 1 Tablet in Pm 13)  Onetouch Ultra Test  Strp (Glucose Blood) .... Pt Test Once Daily 14)  Lamictal 25 Mg Tabs (Lamotrigine) .... Take One Tablet Bedtime 15)  Prednisone 20 Mg Tabs (Prednisone) .... 2 Tabs Daily X10 Days.  Allergies  (verified): 1)  ! Percocet 2)  ! Depakote 3)  ! Celexa 4)  ! Paxil 5)  ! Wellbutrin 6)  ! * Smz 7)  ! * Symbiax  Review of Systems      See HPI  Physical Exam  General:  awake and alert and well-developed.   Head:  Normocephalic and atraumatic without obvious abnormalities.  no TTP over sinuses Eyes:  no injxn or inflammation Ears:  External ear exam shows no significant lesions or deformities.  Otoscopic examination reveals clear canals, tympanic membranes are intact bilaterally without bulging, retraction, inflammation or discharge. Hearing is grossly normal bilaterally. Nose:  External nasal examination shows no deformity or inflammation. Nasal mucosa are pink and moist without lesions or exudates. Mouth:  Oral mucosa and oropharynx without lesions or exudates.   Neck:  No deformities, masses, or tenderness noted. Lungs:  audible wheezing w/out use of stethoscope.  diffuse expiratory wheezes and rhonchi Heart:  normal rate, regular rhythm, and no murmur.     Impression & Recommendations:  Problem # 1:  BRONCHITIS- ACUTE (ICD-466.0) Assessment New pt w/ increased wheezing.  given underlying lung dz (asthma) will start Avelox- 10 days of samples given.  Albuterol inhaler also given.  start prednisone.  cautioned on CBG elevation.  pt aware.  reviewed  supportive care and red flags that should prompt return.  Pt expresses understanding and is in agreement w/ this plan. His updated medication list for this problem includes:    Qvar 40 Mcg/act Aers (Beclomethasone dipropionate) ..... Use 2 puffs as needed in the morning  Complete Medication List: 1)  Cymbalta 60 Mg Cpep (Duloxetine hcl) .... Take one tablet daily 2)  Enalapril Maleate 2.5 Mg Tabs (Enalapril maleate) .... Take one tablet daily 3)  Clonazepam 0.5 Mg Tabs (Clonazepam) .... Take one tablet qid 4)  Simvastatin 40 Mg Tabs (Simvastatin) .... Take one tablet at bedtime 5)  Fexofenadine Hcl 180 Mg Tabs (Fexofenadine  hcl) 6)  Actoplus Met 15-500 Mg Tabs (Pioglitazone hcl-metformin hcl) .... Take one tablet twice daily 7)  Aspirin 81 Mg Tbec (Aspirin) .... Take one tablet daily 8)  Meclizine Hcl 25 Mg Tabs (Meclizine hcl) .... Take one tablet daily as needed for vertigo 9)  Qvar 40 Mcg/act Aers (Beclomethasone dipropionate) .... Use 2 puffs as needed in the morning 10)  Flonase 50 Mcg/act Susp (Fluticasone propionate) .... Use two sprays in each nostril twice daily as needed. 11)  Ec-naprosyn 500 Mg Tbec (Naproxen) .[name].. 1 tab by mouth two times a day 12)  Saphris 5 Mg Subl (Asenapine maleate) .[name].. 1 tablet in pm 13)  Onetouch Ultra Test Strp (Glucose blood) .... Pt test once daily 14)  Lamictal 25 Mg Tabs (Lamotrigine) .... Take one tablet bedtime 15)  Prednisone 20 Mg Tabs (Prednisone) .... 2 tabs daily x10 days.  Patient Instructions: 1)  Call if no better in the next 5 days or at any time worsening 2)  Start the Prednisone as directed 3)  Take the Avelox- 1 daily 4)  Use the inhaler- 2 puffs every 4 hours as needed 5)  Hang in there!! Prescriptions: PREDNISONE 20 MG TABS (PREDNISONE) 2 tabs daily x10 days.  #20 x 0   Entered and Authorized by:   Neena Rhymes MD   Signed by:   Neena Rhymes MD on 07/22/2009   Method used:   [facility] to        [facility] [phone][facility] (retail)       [address]       [place], [place]  [address]       Ph: [phone]       Fax: [phone]   RxID:   [id number]

## 2010-12-15 NOTE — Letter (Signed)
Summary: Moviprep Instructions  [facility]  [address]   [place], [place] [address]   Phone: [phone]  Fax: [phone]       Ryan Zhang    06/29/1946    MRN: [id number]        Procedure Day /Date: Monday, 08-31-10     Arrival Time: 8:00 a.m.      Procedure Time: 9:00 a.m.     Location of Procedure:                    x   [facility] (4th Floor)                        PREPARATION FOR COLONOSCOPY WITH MOVIPREP   Starting 5 days prior to your procedure 08-26-10 do not eat nuts, seeds, popcorn, corn, beans, peas,  salads, or any raw vegetables.  Do not take any fiber supplements (e.g. Metamucil, Citrucel, and Benefiber).  THE DAY BEFORE YOUR PROCEDURE         DATE: 08-30-10  DAY: Sunday  1.  Drink clear liquids the entire day-NO SOLID FOOD  2.  Do not drink anything colored red or purple.  Avoid juices with pulp.  No orange juice.  3.  Drink at least 64 oz. (8 glasses) of fluid/clear liquids during the day to prevent dehydration and help the prep work efficiently.  CLEAR LIQUIDS INCLUDE: Water Jello Ice Popsicles Tea (sugar ok, no milk/cream) Powdered fruit flavored drinks Coffee (sugar ok, no milk/cream) Gatorade Juice: apple, white grape, white cranberry  Lemonade Clear bullion, consomm, broth Carbonated beverages (any kind) Strained chicken noodle soup Hard Candy                             4.  In the morning, mix first dose of MoviPrep solution:    Empty 1 Pouch A and 1 Pouch B into the disposable container    Add lukewarm drinking water to the top line of the container. Mix to dissolve    Refrigerate (mixed solution should be used within 24 hrs)  5.  Begin drinking the prep at 5:00 p.m. The MoviPrep container is divided by 4 marks.   Every 15 minutes drink the solution down to the next mark (approximately 8 oz) until the full liter is complete.   6.  Follow completed prep with 16 oz of clear liquid of your choice  (Nothing red or purple).  Continue to drink clear liquids until bedtime.  7.  Before going to bed, mix second dose of MoviPrep solution:    Empty 1 Pouch A and 1 Pouch B into the disposable container    Add lukewarm drinking water to the top line of the container. Mix to dissolve    Refrigerate  THE DAY OF YOUR PROCEDURE      DATE: 08-31-10  DAY: Monday  Beginning at 4:00 a.m. (5 hours before procedure):         1. Every 15 minutes, drink the solution down to the next mark (approx 8 oz) until the full liter is complete.  2. Follow completed prep with 16 oz. of clear liquid of your choice.    3. You may drink clear liquids until 7:00 a.m.  (2 HOURS BEFORE PROCEDURE).   MEDICATION INSTRUCTIONS  Unless otherwise instructed, you should take regular prescription medications with a small sip of water   as early as possible  the morning of your procedure.  Diabetic patients - see separate instructions.    Additional medication instructions: n/a         OTHER INSTRUCTIONS  You will need a responsible adult at least 63 years of age to accompany you and drive you home.   This person must remain in the waiting room during your procedure.  Wear loose fitting clothing that is easily removed.  Leave jewelry and other valuables at home.  However, you may wish to bring a book to read or  an iPod/MP3 player to listen to music as you wait for your procedure to start.  Remove all body piercing jewelry and leave at home.  Total time from sign-in until discharge is approximately 2-3 hours.  You should go home directly after your procedure and rest.  You can resume normal activities the  day after your procedure.  The day of your procedure you should not:   Drive   Make legal decisions   Operate machinery   Drink alcohol   Return to work  You will receive specific instructions about eating, activities and medications before you leave.    The above instructions have been  reviewed and explained to me by   Sherren Kerns RN  August 17, 2010 9:07 AM  I fully understand and can verbalize these instructions _____________________________ Date _________

## 2010-12-15 NOTE — Progress Notes (Signed)
Summary: rx for mail order - dr Blossom Hoops  Phone Note Call from Patient Call back at Home Phone [phone]   Caller: Patient Summary of Call: patient needs rx for mail order actos 15/500  -twice a day ---enalapril 2.5mg  - once a day   Initial call taken by: Okey Regal Spring,  August 03, 2007 1:48 PM  Follow-up for Phone Call        spoke with pt rx ready for pickup Follow-up by: Kandice Hams,  August 03, 2007 3:59 PM    New/Updated Medications: ENALAPRIL MALEATE 2.5 MG TABS (ENALAPRIL MALEATE) TAKE ONE TABLET DAILY ACTOPLUS MET 15-500 MG TABS (PIOGLITAZONE HCL-METFORMIN HCL) TAKE ONE TABLET TWICE DAILY   Prescriptions: ACTOPLUS MET 15-500 MG TABS (PIOGLITAZONE HCL-METFORMIN HCL) TAKE ONE TABLET TWICE DAILY  #180 x 1   Entered by:   [name]   Authorized by:   Leanne Chang MD   Signed by:   [name] on 08/03/2007   Method used:   Print then Give to Patient   RxID:   [id number] ENALAPRIL MALEATE 2.5 MG TABS (ENALAPRIL MALEATE) TAKE ONE TABLET DAILY  #90 x 1   Entered by:   [name]   Authorized by:   Leanne Chang MD   Signed by:   [name] on 08/03/2007   Method used:   Print then Give to Patient   RxID:   [id number]

## 2010-12-15 NOTE — Letter (Signed)
Summary: Surgical Clearance/[facility]  Surgical Clearance/[facility]   Imported By: Lanelle Bal 01/19/2010 13:09:27  _____________________________________________________________________  External Attachment:    Type:   Image     Comment:   External Document

## 2010-12-15 NOTE — Progress Notes (Signed)
Summary: actoplus met resent  Phone Note Refill Request Message from:  Patient on June 03, 2010 10:57 AM  Refills Requested: Medication #1:  ACTOPLUS MET 15-850 MG TABS 1 by mouth bid.   Dosage confirmed as above?Dosage Confirmed   Supply Requested: 3 months  Method Requested: Pick up at Office Initial call taken by: Harold Barban,  June 03, 2010 10:57 AM  Follow-up for Phone Call        patient aware prescription ready for pick up .......Marland KitchenDoristine Devoid CMA  June 03, 2010 11:58 AM     Prescriptions: ACTOPLUS MET 15-500 MG TABS (PIOGLITAZONE HCL-METFORMIN HCL) TAKE ONE TABLET TWICE DAILY  #180 x 1   Entered by:   [name] CMA   Authorized by:   Neena Rhymes MD   Signed by:   [name] CMA on 06/03/2010   Method used:   Print then Give to Patient   RxID:   [id number]   Appended Document: actoplus met resent     Clinical Lists Changes  Medications: Removed medication of ACTOPLUS MET 15-500 MG TABS (PIOGLITAZONE HCL-METFORMIN HCL) TAKE ONE TABLET TWICE DAILY Rx of ACTOPLUS MET 15-850 MG TABS (PIOGLITAZONE HCL-METFORMIN HCL) 1 by mouth bid;  #180 x 2;  Signed;  Entered by: [name] CMA;  Authorized by: Neena Rhymes MD;  Method used: Print then Give to Patient Rx of ACTOPLUS MET 15-850 MG TABS (PIOGLITAZONE HCL-METFORMIN HCL) 1 by mouth bid;  #60 x 0;  Signed;  Entered by: [name] CMA;  Authorized by: Neena Rhymes MD;  Method used: Print then Give to Patient    Prescriptions: ACTOPLUS MET 15-850 MG TABS (PIOGLITAZONE HCL-METFORMIN HCL) 1 by mouth bid  #60 x 0   Entered by:   [name] CMA   Authorized by:   Neena Rhymes MD   Signed by:   [name] CMA on 06/03/2010   Method used:   Print then Give to Patient   RxID:   [id number] ACTOPLUS MET 15-850 MG TABS (PIOGLITAZONE HCL-METFORMIN HCL) 1 by mouth bid  #180 x 2   Entered by:   [name] CMA   Authorized by:   Neena Rhymes MD   Signed by:   [name] CMA  on 06/03/2010   Method used:   Print then Give to Patient   RxID:   [id number]

## 2010-12-15 NOTE — Progress Notes (Signed)
Summary: finding on eye exam  Phone Note Call from Patient Call back at [phone]   Caller: Dr.Lleyton Earlene Plater  Summary of Call: Dr. 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 They are recommending that you a consider a doppler carotid study. Any questions please feel free to call Dr. Earlene Plater. Army Fossa CMA  May 26, 2010 3:20 PM   Follow-up for Phone Call        will order. Follow-up by: Neena Rhymes MD,  May 27, 2010 8:08 AM

## 2010-12-15 NOTE — Progress Notes (Signed)
Summary: discuss labs  Phone Note Outgoing Call   Summary of Call: pt's lipids are not at goal for DM.  will need to stop Advicor and change to Simvastatin 40mg  (a slightly stronger medication) at bedtime.  #30 w/ 3 refills.  recheck lipids at next visit.  please fax copy of labs and office note to Dr Emerson Monte  Follow-up for Phone Call        left message on machine ..................Marland KitchenDoristine Devoid  February 24, 2009 1:31 PM   spoke with patient aware of labs and recommendations also switching medication informed to call before refill run out so that it can be sent to local pharmacy .............Marland KitchenDoristine Devoid  February 24, 2009 4:20 PM     New/Updated Medications: SIMVASTATIN 40 MG TABS (SIMVASTATIN) take one tablet at bedtime   Prescriptions: SIMVASTATIN 40 MG TABS (SIMVASTATIN) take one tablet at bedtime  #30 x 1   Entered by:   [name]   Authorized by:   Neena Rhymes MD   Signed by:   [name] on 02/24/2009   Method used:   [facility]        [facility] [phone][facility] (retail[facility]       [address]       [place], [place]  [address]       Ph: [phone]       Fax: [phone]   RxID:   [id number]

## 2010-12-15 NOTE — Assessment & Plan Note (Signed)
Summary: rto 3 months.cbs   Vital Signs:  Patient profile:   65 year old male Weight:      272 pounds Pulse rate:   76 / minute BP sitting:   118 / 76  (left arm)  Vitals Entered By: [name] (August 22, 2009 9:04 AM) CC: 3 month roa    History of Present Illness: 65 yo man here today for 3 month f/u on  1) DM- not checking CBGs.  no symptomatic lows.  no CP, SOB, HAs, visual changes, edema.  UTD on eye exam.  2) Hyperlipidemia- due for labs today.  no problems w/ N/V, myalgias on simvastatin.  3) Obesity- has gained 12 lbs in 1 month, reports this is 'what steroids do to me'.  'my appetite has been out of control'.  pt plans on increasing walking and limiting food intake.  Problems Prior to Update: 1)  Obesity  (ICD-278.00) 2)  Laceration of Finger  (ICD-883.0) 3)  Elevated Bp w/o Hypertension  (ICD-796.2) 4)  Bipolar Disorder Unspecified  (ICD-296.80) 5)  Phimosis  (ICD-605) 6)  Knee Pain, Chronic  (ICD-719.46) 7)  Hip Pain, Right  (ICD-719.45) 8)  Preventive Health Care  (ICD-V70.0) 9)  Uri  (ICD-465.9) 10)  Low Back Pain, Chronic  (ICD-724.2) 11)  Meniere's Disease, Hx of  (ICD-V12.49) 12)  Allergic Rhinitis, Seasonal  (ICD-477.0) 13)  Hyperlipidemia  (ICD-272.4) 14)  Diabetes Mellitus, Type II  (ICD-250.00)  Current Medications (verified): 1)  Cymbalta 60 Mg Cpep (Duloxetine Hcl) .... Take One Tablet Daily 2)  Enalapril Maleate 2.5 Mg Tabs (Enalapril Maleate) .... Take One Tablet Daily 3)  Clonazepam 0.5 Mg Tabs (Clonazepam) .... Take One Tablet Qid 4)  Simvastatin 40 Mg Tabs (Simvastatin) .... Take One Tablet At Bedtime 5)  Fexofenadine Hcl 180 Mg Tabs (Fexofenadine Hcl) 6)  Actoplus Met 15-500 Mg Tabs (Pioglitazone Hcl-Metformin Hcl) .... Take One Tablet Twice Daily 7)  Aspirin 81 Mg Tbec (Aspirin) .... Take One Tablet Daily 8)  Meclizine Hcl 25 Mg  Tabs (Meclizine Hcl) .... Take One Tablet Daily As Needed For Vertigo 9)  Qvar 40 Mcg/act  Aers  (Beclomethasone Dipropionate) .... Use 2 Puffs As Needed in The Morning 10)  Flonase 50 Mcg/act  Susp (Fluticasone Propionate) .... Use Two Sprays in Each Nostril Twice Daily As Needed. 11)  Ec-Naprosyn 500 Mg Tbec (Naproxen) .[name].. 1 Tab By Mouth Two Times A Day 12)  Saphris 5 Mg Subl (Asenapine Maleate) .[name].. 1 Tablet in Pm 13)  Onetouch Ultra Test  Strp (Glucose Blood) .... Pt Test Once Daily 14)  Lamictal 25 Mg Tabs (Lamotrigine) .... Take One Tablet Bedtime  Allergies (verified): 1)  ! Percocet 2)  ! Depakote 3)  ! Celexa 4)  ! Paxil 5)  ! Wellbutrin 6)  ! * Smz 7)  ! Marlon Pel  Past History:  Past Medical History: Last updated: 11/20/2008 Diabetes mellitus, type II Hyperlipidemia Hypertension Bipolar Allergy shots by Dr Corinda Gubler  Social History: Last updated: 01/10/2008 Occupation: disability Married Never Smoked Alcohol use-no  Review of Systems      See HPI       The patient complains of weight gain.  The patient denies anorexia, fever, vision loss, decreased hearing, hoarseness, chest pain, syncope, dyspnea on exertion, peripheral edema, headaches, and abdominal pain.    Physical Exam  General:  awake and alert and well-developed.   Head:  Normocephalic and atraumatic without obvious abnormalities. Eyes:  PERRL, EOMI Neck:  No deformities, masses,  or tenderness noted. Lungs:  CTAB Heart:  normal rate, regular rhythm, and no murmur.   Pulses:  +2 carotid, radial, DP Extremities:  no C/C/E   Impression & Recommendations:  Problem # 1:  DIABETES MELLITUS, TYPE II (ICD-250.00) Assessment Unchanged unclear on pt's level of control b/c he's not checking CBGs regularly.  check labs.  UTD on eye and foot exams. His updated medication list for this problem includes:    Enalapril Maleate 2.5 Mg Tabs (Enalapril maleate) .[name]... Take one tablet daily    Actoplus Met 15-500 Mg Tabs (Pioglitazone hcl-metformin hcl) .[name]... Take one tablet twice daily    Aspirin 81 Mg Tbec  (Aspirin) .[name]... Take one tablet daily  Orders: Venipuncture (27253) TLB-A1C / Hgb A1C (Glycohemoglobin) (83036-A1C)  Problem # 2:  HYPERLIPIDEMIA (ICD-272.4) Assessment: Unchanged tolerating statin w/out problems.  check labs.  adjust meds as needed. His updated medication list for this problem includes:    Simvastatin 40 Mg Tabs (Simvastatin) .[name]... Take one tablet at bedtime  Orders: TLB-Lipid Panel (80061-LIPID) TLB-Hepatic/Liver Function Pnl (80076-HEPATIC)  Problem # 3:  OBESITY (ICD-278.00) Assessment: New pt now up to 272 lbs.  reports this often happens after steroids- plans on limiting food intake and increasing amount of walking.  will follow.  Complete Medication List: 1)  Cymbalta 60 Mg Cpep (Duloxetine hcl) .... Take one tablet daily 2)  Enalapril Maleate 2.5 Mg Tabs (Enalapril maleate) .... Take one tablet daily 3)  Clonazepam 0.5 Mg Tabs (Clonazepam) .... Take one tablet qid 4)  Simvastatin 40 Mg Tabs (Simvastatin) .... Take one tablet at bedtime 5)  Fexofenadine Hcl 180 Mg Tabs (Fexofenadine hcl) 6)  Actoplus Met 15-500 Mg Tabs (Pioglitazone hcl-metformin hcl) .... Take one tablet twice daily 7)  Aspirin 81 Mg Tbec (Aspirin) .... Take one tablet daily 8)  Meclizine Hcl 25 Mg Tabs (Meclizine hcl) .... Take one tablet daily as needed for vertigo 9)  Qvar 40 Mcg/act Aers (Beclomethasone dipropionate) .... Use 2 puffs as needed in the morning 10)  Flonase 50 Mcg/act Susp (Fluticasone propionate) .... Use two sprays in each nostril twice daily as needed. 11)  Ec-naprosyn 500 Mg Tbec (Naproxen) .[name].. 1 tab by mouth two times a day 12)  Saphris 5 Mg Subl (Asenapine maleate) .[name].. 1 tablet in pm 13)  Onetouch Ultra Test Strp (Glucose blood) .... Pt test once daily 14)  Lamictal 25 Mg Tabs (Lamotrigine) .... Take one tablet bedtime  Other Orders: Admin 1st Vaccine (66440) Flu Vaccine 97yrs + (34742) Tdap => 19yrs IM (59563) Admin of Any Addtl Vaccine (87564)  Patient  Instructions: 1)  Please schedule a follow-up appointment in 3 months for routine diabetes follow up. 2)  Try and increase your amount of exercise to get that steroid weight off 3)  We'll notify you of your lab results 4)  Call with any questions or concerns 5)  CONGRATS on the news of your new grandbaby!!     Flu Vaccine Consent Questions     Do you have a history of severe allergic reactions to this vaccine? no    Any prior history of allergic reactions to egg and/or gelatin? no    Do you have a sensitivity to the preservative Thimersol? no    Do you have a past history of Guillan-Barre Syndrome? no    Do you currently have an acute febrile illness? no    Have you ever had a severe reaction to latex? no    Vaccine information given and explained  to patient? yes    Are you currently pregnant? no    Lot Number:[id number]   Exp Date:05/14/2010   Site Given  Left Deltoid IMlbflu    Immunizations Administered:  Tetanus Vaccine:    Vaccine Type: Tdap    Site: right deltoid    Mfr: San[employer]i Pasteur    Dose: 0.5 ml    Route: IM    Given by: [name]    Exp. Date: 06/13/2011    Lot #: [id number]

## 2010-12-15 NOTE — Assessment & Plan Note (Signed)
Summary: sore on left hand//fd   Vital Signs:  Patient profile:   65 year old male Weight:      239.6 pounds Temp:     97.9 degrees F oral BP sitting:   124 / 70  (left arm)  Vitals Entered By: [name] (May 14, 2009 2:30 PM) CC: cut left middle finger x1 month still red and painful    History of Present Illness: 65 yo man here today for L middle finger laceration.  injured 1 month ago on knife.  now red and painful w/ heaped up skin around the nail.  some oozing from area.   Problems Prior to Update: 1)  Elevated Bp w/o Hypertension  (ICD-796.2) 2)  Bipolar Disorder Unspecified  (ICD-296.80) 3)  Phimosis  (ICD-605) 4)  Knee Pain, Chronic  (ICD-719.46) 5)  Hip Pain, Right  (ICD-719.45) 6)  Preventive Health Care  (ICD-V70.0) 7)  Uri  (ICD-465.9) 8)  Low Back Pain, Chronic  (ICD-724.2) 9)  Meniere's Disease, Hx of  (ICD-V12.49) 10)  Allergic Rhinitis, Seasonal  (ICD-477.0) 11)  Hyperlipidemia  (ICD-272.4) 12)  Diabetes Mellitus, Type II  (ICD-250.00)  Allergies (verified): 1)  ! Percocet 2)  ! Depakote 3)  ! Celexa 4)  ! Paxil 5)  ! Wellbutrin 6)  ! * Smz 7)  ! Marlon Pel  Past History:  Past Medical History: Last updated: 11/20/2008 Diabetes mellitus, type II Hyperlipidemia Hypertension Bipolar Allergy shots by Dr Corinda Gubler  Review of Systems      See HPI  Physical Exam  Extremities:  L middle finger w/ old laceration along radial (medial) border of nail w/ presence of pyogenic granuloma.  + erythema and induration.  some oozing and pain when touched.   Impression & Recommendations:  Problem # 1:  LACERATION OF FINGER (ICD-883.0) Assessment New  pt's finger obviously infected.  start doxy.  UTD on tetanus.  apparent pyogenic granuloma present on radial side of nail- will refer to hand specialist.  reviewed supportive care and red flags that should prompt return.  Pt expresses understanding and is in agreement w/ this plan.  Orders: Misc. Referral  (Misc. Ref)  Complete Medication List: 1)  Cymbalta 60 Mg Cpep (Duloxetine hcl) .... Take one tablet daily 2)  Enalapril Maleate 2.5 Mg Tabs (Enalapril maleate) .... Take one tablet daily 3)  Clonazepam 0.5 Mg Tabs (Clonazepam) .... Take one tablet qid 4)  Simvastatin 40 Mg Tabs (Simvastatin) .... Take one tablet at bedtime 5)  Fexofenadine Hcl 180 Mg Tabs (Fexofenadine hcl) 6)  Actoplus Met 15-500 Mg Tabs (Pioglitazone hcl-metformin hcl) .... Take one tablet twice daily 7)  Aspirin 81 Mg Tbec (Aspirin) .... Take one tablet daily 8)  Meclizine Hcl 25 Mg Tabs (Meclizine hcl) .... Take one tablet daily as needed for vertigo 9)  Qvar 40 Mcg/act Aers (Beclomethasone dipropionate) .... Use 2 puffs as needed in the morning 10)  Flonase 50 Mcg/act Susp (Fluticasone propionate) .... Use two sprays in each nostril twice daily as needed. 11)  Ec-naprosyn 500 Mg Tbec (Naproxen) .[name].. 1 tab by mouth two times a day 12)  Saphris 5 Mg Subl (Asenapine maleate) .[name].. 1 tablet in pm 13)  Onetouch Ultra Test Strp (Glucose blood) .... Pt test once daily 14)  Lamictal 25 Mg Tabs (Lamotrigine) .... Take one tablet bedtime 15)  Doxycycline Hyclate 100 Mg Caps (Doxycycline hyclate) .... Take 1 tab twice a day  Patient Instructions: 1)  Someone will call you with your appt for the  hand specialist 2)  Keep area clean and dry 3)  Take the Doxycycline two times a day- take w/ food to avoid upset stomach 4)  Apply Neosporin to area 5)  Call if any questions or concerns 6)  Happy 4th of July! Prescriptions: DOXYCYCLINE HYCLATE 100 MG CAPS (DOXYCYCLINE HYCLATE) Take 1 tab twice a day  #28 x 0   Entered and Authorized by:   Neena Rhymes MD   Signed by:   Neena Rhymes MD on 05/14/2009   Method used:   Electronically to        [facility] [phone][facility] (retail)       [address]       [place], [place]  [address]       Ph: [phone]       Fax: [phone]   RxID:    [id number]

## 2010-12-15 NOTE — Progress Notes (Signed)
Summary: meds-LMOM  Phone Note Refill Request Call back at Home Phone [phone]   Refills Requested: Medication #1:  ACTOPLUS MET 15-850 MG TABS 1 by mouth bid.   Dosage confirmed as above?Dosage Confirmed Initial call taken by: Kathrynn Speed CMA,  June 02, 2010 4:35 PM Caller: Patient Summary of Call: Pt called and would like to discuss [facility]. Army Fossa CMA  June 01, 2010 4:19 PM  Called pt lft a msg on machine to call the office if still have questions  Initial call taken by: Kathrynn Speed CMA,  June 02, 2010 12:56 PM  Follow-up for Phone Call        Spoke with pt told him that we filled med [facility] if not call back & we wil refill the 30 day supply he is still wanting a 90 day supply sent to [facility] mail order.  Still needs 90 day supply sent to [facility] Follow-up by: Kathrynn Speed CMA,  June 02, 2010 4:18 PM    Prescriptions: ACTOPLUS MET 15-500 MG TABS (PIOGLITAZONE HCL-METFORMIN HCL) TAKE ONE TABLET TWICE DAILY  #180 x 1   Entered by:   Kathrynn Speed CMA   Authorized by:   Neena Rhymes MD   Signed by:   Kathrynn Speed CMA on 06/02/2010   Method used:   Faxed to ...       [facility] ([facility])             , [place]         Ph: [phone]       Fax: [phone]   RxID:   [id number] ACTOPLUS MET 15-500 MG TABS (PIOGLITAZONE HCL-METFORMIN HCL) TAKE ONE TABLET TWICE DAILY  #60 x 1   Entered by:   Kathrynn Speed CMA   Authorized by:   Neena Rhymes MD   Signed by:   Kathrynn Speed CMA on 06/02/2010   Method used:   [facility]        [facility] [phone]* (retail)       [address]       [place], [place]  [address]       Ph: [phone]       Fax: [phone]   RxID:   [id number]

## 2010-12-15 NOTE — Letter (Signed)
Summary: Pre Visit Letter Revised  [facility]  [address] [place], [place] [address]   Phone: [phone]  Fax: [phone]    07/01/2010 MRN: [id number]  Ryan Zhang [address] [place], [place]  [address]              Procedure Date:  08/31/2010   Welcome to the Gastroenterology Division at [facility].    You are scheduled to see a nurse for your pre-procedure visit on 08/17/2010 at 1:00PM on the 3rd floor at [facility], [address]  We ask that you try to arrive at our office 15 minutes prior to your appointment time to allow for check-in.  Please take a minute to review the attached form.  If you answer "Yes" to one or more of the questions on the first page, we ask that you call the person listed at your earliest opportunity.  If you answer "No" to all of the questions, please complete the rest of the form and bring it to your appointment.    Your nurse visit will consist of discussing your medical and surgical history, your immediate family medical history, and your medications.    If you are unable to list all of your medications on the form, please bring the medication bottles to your appointment and we will list them.  We will need to be aware of both prescribed and over the counter drugs.  We will need to know exact dosage information as well.    Please be prepared to read and sign documents such as consent forms, a financial agreement, and acknowledgement forms.  If necessary, and with your consent, a friend or relative is welcome to sit-in on the nurse visit with you.  Please bring your insurance card so that we may make a copy of it.  If your insurance requires a referral to see a specialist, please bring your referral form from your primary care physician.  No co-pay is required for this nurse visit.     If you cannot keep your appointment, please call [phone] to cancel or reschedule prior to your appointment date.  This allows [place]  the opportunity to schedule an appointment for another patient in need of care.   Thank you for choosing [facility] for your medical needs.  We appreciate the opportunity to care for you.  Please visit [place] at our website  to learn more about our practice.                     Sincerely,  The Gastroenterology Division

## 2010-12-15 NOTE — Assessment & Plan Note (Signed)
Summary: cpx//pt will be fasting//lch   Vital Signs:  Patient profile:   65 year old male Height:      70 inches Weight:      269 pounds BMI:     38.74 O2 Sat:      94 % on Room air Temp:     98.3 degrees F oral Pulse rate:   92 / minute BP sitting:   130 / 76  (left arm)  Vitals Entered By: [name] CMA (May 27, 2010 8:07 AM)  O2 Flow:  Room air  CC: CPX AND LABS    History of Present Illness: 65 yo man here today for CPE.  Due for colonoscopy  1) HTN- adequate control.  no CP, SOB.  2) DM- not checking CBGs.  no symptomatic lows.  3) Obesity- has lost 13 lbs.  has started exercising, watching diet.  4) Hyperlipidemia- due for lab recheck.  5) URI- sxs started a 'couple days ago'.  no fevers.  + nasal congestion, cough- nonproductive.  no ear pain.  no sore throat.  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        Drug Use:  never.    Problems Prior to Update: 1)  Essential Hypertension, Benign  (ICD-401.1) 2)  Hyperlipidemia  (ICD-272.4) 3)  Abnormal Electrocardiogram  (ICD-794.31) 4)  Preoperative Examination  (ICD-V72.84) 5)  Asthma, With Acute Exacerbation  (ICD-493.92) 6)  Gerd  (ICD-530.81) 7)  Asthma, Mild, Intermittent  (ICD-493.90) 8)  Obesity  (ICD-278.00) 9)  Laceration of Finger  (ICD-883.0) 10)  Bipolar Disorder Unspecified  (ICD-296.80) 11)  Phimosis  (ICD-605) 12)  Knee Pain, Chronic  (ICD-719.46) 13)  Hip Pain, Right  (ICD-719.45) 14)  Preventive Health Care  (ICD-V70.0) 15)  Uri  (ICD-465.9) 16)  Low Back Pain, Chronic  (ICD-724.2) 17)  Meniere's Disease, Hx of  (ICD-V12.49) 18)  Allergic Rhinitis, Seasonal  (ICD-477.0) 19)   Diabetes Mellitus, Type II  (ICD-250.00)  Current Medications (verified): 1)  Cymbalta 60 Mg Cpep (Duloxetine Hcl) .... Take One Tablet Daily 2)  Enalapril Maleate 2.5 Mg Tabs (Enalapril Maleate) .... Take One Tablet Daily 3)  Clonazepam 0.5 Mg Tabs (Clonazepam) .... Take One Tablet Qid 4)  Simvastatin 40 Mg Tabs (Simvastatin) .... Take One Tablet At Bedtime 5)  Fexofenadine Hcl 180 Mg Tabs (Fexofenadine Hcl) 6)  Actoplus Met 15-500 Mg Tabs (Pioglitazone Hcl-Metformin Hcl) .... Take One Tablet Twice Daily 7)  Aspirin 81 Mg Tbec (Aspirin) .... Take One Tablet Daily 8)  Meclizine Hcl 25 Mg  Tabs (Meclizine Hcl) .... Take One Tablet Daily As Needed For Vertigo 9)  Symbicort 80-4.5 Mcg/act Aero (Budesonide-Formoterol Fumarate) .... Two Puffs Twice Daily 10)  Flonase 50 Mcg/act  Susp (Fluticasone Propionate) .... Use Two Sprays in Each Nostril Twice Daily As Needed. 11)  Ec-Naprosyn 500 Mg Tbec (Naproxen) .[name].. 1 Tab By Mouth Two Times A Day 12)  Saphris 5 Mg Subl (Asenapine Maleate) .[name].. 1 Tablet in Pm 13)  Onetouch Ultra Test  Strp (Glucose Blood) .... Pt Test Once Daily 14)  Lamictal Xr 50 Mg Xr24h-Tab (Lamotrigine) .[name].. 1 By Mouth Daily 15)  Ambien Cr 12.5 Mg Cr-Tabs (Zolpidem Tartrate) .Marland KitchenMarland KitchenMarland Kitchen  1 By Mouth As Needed 16)  Trazodone Hcl 50 Mg Tabs (Trazodone Hcl) .... 3 By Mouth As Needed 17)  Cogentin 2mg  .... 1 By Mouth Daily 18)  Gabapentin 300 Mg Caps (Gabapentin) .[name].. 1 Qam and 2 Qpm 19)  [facility] Spectravite Senior  Tabs (Multiple Vitamins-Minerals) .[name].. 1 By Mouth Daily  Allergies (verified): 1)  ! Percocet 2)  ! Depakote 3)  ! Celexa 4)  ! Paxil 5)  ! Wellbutrin 6)  ! * Smz 7)  ! * Symbiax 8)  ! * Risperidone  Past History:  Past Medical History: Diabetes mellitus, type II Hyperlipidemia Hypertension Bipolar Allergy shots by Dr Corinda Gubler DJD Obesity Asthma GERD Meniere's disease Normal stress test 3/11 Holen Horst Plaque in eye  Past Surgical History: Cholecystectomy Knee  surgery- Dr Charlann Boxer  Social History: Does Patient Exercise:  yes Sexual History:  currently monogamous Drug Use:  never  Review of Systems       The patient complains of decreased hearing.  The patient denies anorexia, fever, weight loss, weight gain, vision loss, hoarseness, chest pain, syncope, dyspnea on exertion, peripheral edema, prolonged cough, headaches, abdominal pain, melena, hematochezia, severe indigestion/heartburn, hematuria, suspicious skin lesions, depression, abnormal bleeding, enlarged lymph nodes, and testicular masses.         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 Eyes:  PERRLA- fundoscopic exam deferred to ophtho Ears:  External ear exam shows no significant lesions or deformities.  Otoscopic examination reveals clear canals, tympanic membranes are intact bilaterally without bulging, retraction, inflammation or discharge. Hearing is grossly normal bilaterally. Nose:  External nasal examination shows no deformity or inflammation. Nasal mucosa are pink and moist without lesions or exudates. Mouth:  Oral mucosa and oropharynx without lesions or exudates.  involuntary oralmotor movements Neck:  No deformities, masses, or tenderness noted. Lungs:  Normal respiratory effort, chest expands symmetrically. Lungs are clear to auscultation, no crackles or wheezes.  + hacking cough Heart:  Normal rate and regular rhythm. S1 and S2 normal without gallop, murmur, click, rub or other extra sounds. Abdomen:  Bowel sounds positive,abdomen soft and non-tender without masses, organomegaly.  ventral hernia noted when pt sits up Rectal:  No external abnormalities noted. Normal sphincter tone. No rectal masses or tenderness.  copious stool in vault Genitalia:  Testes bilaterally descended without nodularity, tenderness or masses. No scrotal masses or lesions. No penis lesions or  urethral discharge. Prostate:  unable to feel prostate due to amount of stool Msk:  No deformity or scoliosis noted of thoracic or lumbar spine.   Pulses:  +2 carotid, radial, DP Extremities:  +1 edema of LEs bilaterally Neurologic:  No cranial nerve deficits noted. Station and gait are normal. Plantar reflexes are down-going bilaterally. DTRs are symmetrical throughout.  Skin:  Intact without suspicious lesions or rashes Cervical Nodes:  No lymphadenopathy noted Inguinal Nodes:  No significant adenopathy Psych:  mouth movements consistent w/ tardive dyskinesia.  Diabetes Management Exam:    Foot Exam (with socks and/or shoes not present):       Sensory-Pinprick/Light touch:          Left medial foot (L-4): normal          Left dorsal foot (L-5): normal          Left lateral foot (S-1): normal          Right medial foot (L-4): normal  Right dorsal foot (L-5): normal          Right lateral foot (S-1): normal       Sensory-Monofilament:          Left foot: normal          Right foot: normal       Inspection:          Left foot: normal          Right foot: normal       Nails:          Left foot: thickened          Right foot: thickened    Eye Exam:       Eye Exam done elsewhere          Date: 05/26/2010          Results: Hildred Laser Plaque- Right eye          Done by: [facility]   Impression & Recommendations:  Problem # 1:  DIABETES MELLITUS, TYPE II (ICD-250.00) Assessment Unchanged check labs to assess level of control as pt doesn't bring meter or log.  UTD on foot and eye exam. His updated medication list for this problem includes:    Enalapril Maleate 2.5 Mg Tabs (Enalapril maleate) .[name]... Take one tablet daily    Actoplus Met 15-500 Mg Tabs (Pioglitazone hcl-metformin hcl) .[name]... Take one tablet twice daily    Aspirin 81 Mg Tbec (Aspirin) .[name]... Take one tablet daily  Orders: TLB-TSH (Thyroid Stimulating Hormone) (84443-TSH) TLB-A1C / Hgb A1C  (Glycohemoglobin) (83036-A1C)  Problem # 2:  ESSENTIAL HYPERTENSION, BENIGN (ICD-401.1) Assessment: Unchanged adequate control.  will check labs. His updated medication list for this problem includes:    Enalapril Maleate 2.5 Mg Tabs (Enalapril maleate) .[name]... Take one tablet daily  Orders: TLB-BMP (Basic Metabolic Panel-BMET) (80048-METABOL) TLB-CBC Platelet - w/Differential (85025-CBCD)  Problem # 3:  HYPERLIPIDEMIA (ICD-272.4) Assessment: Unchanged tolerating meds, check labs. His updated medication list for this problem includes:    Simvastatin 40 Mg Tabs (Simvastatin) .[name]... Take one tablet at bedtime  Orders: Venipuncture (91478) TLB-Lipid Panel (80061-LIPID) TLB-Hepatic/Liver Function Pnl (80076-HEPATIC)  Problem # 4:  OBESITY (ICD-278.00) Assessment: Unchanged pt now losing weight due to knee rehab.  applauded his efforts.  Problem # 5:  URI (ICD-465.9) Assessment: Unchanged pt w/ apparent bronchitis.  given underlying lung dz and upcoming surgery will tx w/ Zpack.  reviewed supportive care and red flags that should prompt return.  Pt expresses understanding and is in agreement w/ this plan. His updated medication list for this problem includes:    Fexofenadine Hcl 180 Mg Tabs (Fexofenadine hcl)    Aspirin 81 Mg Tbec (Aspirin) .[name]... Take one tablet daily    Ec-naprosyn 500 Mg Tbec (Naproxen) .[name]... 1 tab by mouth two times a day    Tessalon 200 Mg Caps (Benzonatate) .[name]... Take one capsule by mouth three times a day as needed for cough  Problem # 6:  PREVENTIVE HEALTH CARE (ICD-V70.0) Assessment: Unchanged PE unchanged from previous.  needs to schedule F/U colonoscopy as he received call back letter.  will likely wait until after knee surgery. 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  will order carotid doppler to  assess.  Orders: Cardiology Referral (Cardiology)  Complete Medication List: 1)  Cymbalta 60 Mg Cpep (Duloxetine hcl) .... Take one tablet daily 2)  Enalapril Maleate 2.5 Mg Tabs (Enalapril maleate) .... Take one tablet daily 3)  Clonazepam 0.5 Mg Tabs (Clonazepam) .... Take one tablet qid 4)  Simvastatin 40 Mg Tabs (Simvastatin) .... Take one tablet at bedtime 5)  Fexofenadine Hcl 180 Mg Tabs (Fexofenadine hcl) 6)  Actoplus Met 15-500 Mg Tabs (Pioglitazone hcl-metformin hcl) .... Take one tablet twice daily 7)  Aspirin 81 Mg Tbec (Aspirin) .... Take one tablet daily 8)  Meclizine Hcl 25 Mg Tabs (Meclizine hcl) .... Take one tablet daily as needed for vertigo 9)  Symbicort 80-4.5 Mcg/act Aero (Budesonide-formoterol fumarate) .... Two puffs twice daily 10)  Flonase 50 Mcg/act Susp (Fluticasone propionate) .... Use two sprays in each nostril twice daily as needed. 11)  Ec-naprosyn 500 Mg Tbec (Naproxen) .[name].. 1 tab by mouth two times a day 12)  Saphris 5 Mg Subl (Asenapine maleate) .[name].. 1 tablet in pm 13)  Onetouch Ultra Test Strp (Glucose blood) .... Pt test once daily 14)  Lamictal Xr 50 Mg Xr24h-tab (Lamotrigine) .[name].. 1 by mouth daily 15)  Ambien Cr 12.5 Mg Cr-tabs (Zolpidem tartrate) .[name].. 1 by mouth as needed 16)  Trazodone Hcl 50 Mg Tabs (Trazodone hcl) .... 3 by mouth as needed 17)  Cogentin 2mg   .... 1 by mouth daily 18)  Gabapentin 300 Mg Caps (Gabapentin) .[name].. 1 qam and 2 qpm 19)  [facility] Spectravite Senior Tabs (Multiple vitamins-minerals) .[name].. 1 by mouth daily 20)  Azithromycin 250 Mg Tabs (Azithromycin) .... 2 by  mouth today and then 1 daily for 4 days 21)  Tessalon 200 Mg Caps (Benzonatate) .... Take one capsule by mouth three times a day as needed for cough  Patient Instructions: 1)  Follow up in 3 months for your diabetes check 2)  Someone will call you with your carotid ultrasound appt 3)  Please call GI to schedule your colonoscopy at [phone] 4)  We'll notify you of your  lab results 5)  Take the Azithromycin for your bronchitis 6)  Use the Tessalon as needed for cough 7)  Good luck with your knee surgery!  Watch your diet closely b/c the surgery will cause your sugars to spike 8)  Call with any questions or concerns 9)  Hang in there! Prescriptions: TESSALON 200 MG CAPS (BENZONATATE) Take one capsule by mouth three times a day as needed for cough  #60 x 0   Entered and Authorized by:   Neena Rhymes MD   Signed by:   Neena Rhymes MD on 05/27/2010   Method used:   [facility]        [facility] [phone]* (retail)       [address]       [place], [place]  [address]       Ph: [phone]       Fax: [phone]   RxID:   [id number] AZITHROMYCIN 250 MG  TABS (AZITHROMYCIN) 2 by  mouth today and then 1 daily for 4 days  #6 x 0   Entered and Authorized by:   Neena Rhymes MD   Signed by:   Neena Rhymes MD on 05/27/2010   Method used:   [facility] to        [facility] [phone]* (retail)       [address]       [place], [place]  [address]       Ph: [phone]  Fax: [phone]   RxID:   [id number]

## 2010-12-15 NOTE — Progress Notes (Signed)
Summary: refill on naprosyn  Phone Note Call from Patient   Caller: Patient-voicemail Summary of Call: patient left msg says that the naprosyn helped knee pain and would like to get prescription 90 days supply  Initial call taken by: [name],  December 10, 2008 10:38 AM  Follow-up for Phone Call        spoke with patient informed that ok to have refill on Naprosyn for mail order and patient will pick up...................Marland KitchenDoristine Devoid  December 10, 2008 11:03 AM       Prescriptions: EC-NAPROSYN 500 MG TBEC (NAPROXEN) 1 TAB by mouth two times a day X 10 DAYS AND THEN AS NEEDED FOR HIP PAIN  #90 x 2   Entered by:   [name]   Authorized by:   Neena Rhymes MD   Signed by:   [name] on 12/10/2008   Method used:   Print then Give to Patient   RxID:   [id number]

## 2010-12-15 NOTE — Progress Notes (Signed)
Summary: HAND REFERRAL  Phone Note From Other Clinic   Caller: Appointment Secretary Summary of Call: FYI FOR CHART..........Marland KitchenREFERENCE MISC REFERRAL TO [facility].  PT WAS SCH'D TO SEE DR KUZMA TOMORROW 05-15-09 @ [facility] ON [address]  I CALLED PT TO INFORM HIM, HE SAID HE HAS AN APPT ELSEWHERE TOMORROW, SO HE RSC'D TO 05-16-09.  NOW I JUST REC'D A CALL FROM ROBIN W/DR KUZMA'S OFFICE THAT PT CALLED THEM, SAID HE HAS TO TAKE CARE OF HIS GRANDAUGHTER & HE RSC'D AGAIN TO 05-22-09.  ROBIN WANTED DR. Jorrell Kuster TO BE AWARE. Initial call taken by: Magdalen Spatz [facility],  May 14, 2009 3:49 PM  Follow-up for Phone Call        thank you for the information Follow-up by: Neena Rhymes MD,  May 14, 2009 4:09 PM

## 2010-12-15 NOTE — Assessment & Plan Note (Signed)
Summary: acute - cough,congested.cbs   Vital Signs:  Patient Profile:   65 Years Old Male Height:     70 inches Weight:      250.2 pounds O2 Sat:      91 % Temp:     96.9 degrees F oral Pulse rate:   88 / minute BP sitting:   120 / 80  (left arm)  Vitals Entered By: [name] (October 31, 2008 11:41 AM)                 PCP:  Laury Axon  Chief Complaint:  cough and congestion x2 weeks has tried mycinex dm and nyquil w/o improvement.  History of Present Illness: 65 yo man w/ cold sxs for 2.5 weeks- 'i'm not getting any better'.  cough- minimally productive, nasal congestion and drainage- clear.  no ear pain.  'scratchy' throat.  no fevers.  + sick contacts.  drinking lots of fluids.  +fatigue.  Mucinex DM and Nyquil w/ some relief.  Losing voice.  Hx of bronchitis/pna    Current Allergies (reviewed today): ! PERCOCET ! DEPAKOTE ! CELEXA ! PAXIL ! WELLBUTRIN ! * SMZ ! * SYMBIAX     Review of Systems      See HPI    Physical Exam  General:     Well-developed,well-nourished,in no acute distress; alert,appropriate and cooperative throughout examination Head:     Normocephalic and atraumatic without obvious abnormalities. Eyes:     no injxn or inflammation Ears:     External ear exam shows no significant lesions or deformities.  Otoscopic examination reveals clear canals, tympanic membranes are intact bilaterally without bulging, retraction, inflammation or discharge. Hearing is grossly normal bilaterally. Nose:     External nasal examination shows no deformity or inflammation. Nasal mucosa are pink and moist without lesions or exudates. Mouth:     Oral mucosa and oropharynx without lesions or exudates.   Neck:     No deformities, masses, or tenderness noted. Lungs:     Normal respiratory effort, chest expands symmetrically. Lungs are clear to auscultation, no crackles or wheezes. Heart:     normal rate, regular rhythm, and no murmur.      Impression &  Recommendations:  Problem # 1:  BRONCHITIS-ACUTE (ICD-466.0) Assessment: New Pt w/ 2.5 weeks of cough- not improving.  Given hx of bronchitis and PNA will start Amox.  Wanted to prescribe Azithro but when combined w/ pt's Geodon can cause prolonged QT.  Lungs CTA- no evidence of PNA.  Pt given red flags that should prompt return, supportive care reviewed.  Pt expresses understanding and is in agreement w/ this plan. His updated medication list for this problem includes:    Qvar 40 Mcg/act Aers (Beclomethasone dipropionate) ..... Use 2 puffs as needed in the morning    Amoxicillin 500 Mg Tabs (Amoxicillin) .[name]... 1 tab by mouth two times a day x7 days   Complete Medication List: 1)  Cymbalta 60 Mg Cpep (Duloxetine hcl) 2)  Enalapril Maleate 2.5 Mg Tabs (Enalapril maleate) .... Take one tablet daily 3)  Clonazepam 0.5 Mg Tabs (Clonazepam) 4)  Gabapentin 300 Mg Caps (Gabapentin) 5)  Advicor 500-20 Mg Tb24 (Niacin-lovastatin) .... Take 2 tablet by mouth once a day 6)  Fexofenadine Hcl 180 Mg Tabs (Fexofenadine hcl) 7)  Actoplus Met 15-500 Mg Tabs (Pioglitazone hcl-metformin hcl) .... Take one tablet twice daily 8)  Adprin B 325 Mg Tabs (Aspirin buf(cacarb-mgcarb-mgo)) .... Take one tablet daily 9)  Meclizine Hcl 25 Mg  Tabs (Meclizine hcl) .... Take one tablet daily as needed for vertigo 10)  Qvar 40 Mcg/act Aers (Beclomethasone dipropionate) .... Use 2 puffs as needed in the morning 11)  Flonase 50 Mcg/act Susp (Fluticasone propionate) .... Use two sprays in each nostril twice daily as needed. 12)  Ec-naprosyn 500 Mg Tbec (Naproxen) .[name].. 1 tab by mouth two times a day x 10 days and then as needed for hip pain 13)  Geodon 80 Mg Caps (Ziprasidone hcl) .... 2 tabs daily 14)  Amoxicillin 500 Mg Tabs (Amoxicillin) .[name].. 1 tab by mouth two times a day x7 days   Patient Instructions: 1)  Follow up as needed or as regularly scheduled on January 6 at 10 2)  Take the Amoxicillin twice a day for 7  days 3)  Continue the Mucinex DM for daytime cough and Nyquil for night cough as needed 4)  Use tylenol or ibuprofen as needed for pain or fever 5)  Drink plenty of fluids 6)  Call with any questions or concerns 7)  Have a wonderful holiday!   Prescriptions: AMOXICILLIN 500 MG TABS (AMOXICILLIN) 1 tab by mouth two times a day x7 days  #14 x 0   Entered and Authorized by:   Neena Rhymes MD   Signed by:   Neena Rhymes MD on 10/31/2008   Method used:   Print then Give to Patient   RxID:   [id number]  ]

## 2010-12-15 NOTE — Progress Notes (Signed)
Summary: SURGERY CLEARANCE  Phone Note Call from Patient   Caller: Patient Summary of Call: PT CAME IN WITH SURGERY CLEARANCE NOTE  TO BE FILL OUT BY THE DOCTOR. PT LAST OV WAS IN THIS MONTH. NOTE GIVE TO THE NURSE FOR [facility] Initial call taken by: Freddy Jaksch,  January 07, 2010 11:07 AM  Follow-up for Phone Call        spoke w/ patient aware appt needed for clearance.....Marland KitchenMarland KitchenDoristine Devoid  January 07, 2010 4:31 PM

## 2010-12-15 NOTE — Progress Notes (Signed)
Summary: actoplus met refill   Phone Note Refill Request Call back at Work Phone [phone] Message from:  Patient on October 12, 2010 8:21 AM  Refills Requested: Medication #1:  ACTOPLUS MET 15-850 MG TABS take one tablet two times a day   Dosage confirmed as above?Dosage Confirmed   [facility]ly Requested: [facility] month [facility]  Next Appointment Scheduled: 1.13.12 Initial call taken by: Lavell Islam,  October 12, 2010 8:21 AM    Prescriptions: ACTOPLUS MET 15-850 MG TABS (PIOGLITAZONE HCL-METFORMIN HCL) take one tablet two times a day  #60 x 6   Entered by:   [name] CMA   Authorized by:   Neena Rhymes MD   Signed by:   [name] CMA on 10/12/2010   Method used:   [facility]ronically to        [facility] [phone]* (retail)       [address]       [place], [place]  [address]       Ph: [phone]       Fax: [phone]   RxID:   [id number]

## 2010-12-15 NOTE — Miscellaneous (Signed)
Summary: Orders Update  Clinical Lists Changes  Orders: Added new Test order of Carotid Duplex (Carotid Duplex) - Signed 

## 2010-12-15 NOTE — Progress Notes (Signed)
Summary: simvstatin resent   Phone Note Refill Request Message from:  Fax from Pharmacy on May 12, 2010 4:54 PM  Refills Requested: Medication #1:  SIMVASTATIN 40 MG TABS take one tablet at bedtime [facility] - fax [phone] - phone [phone]   Next Appointment Scheduled: [phone] cpx Initial call taken by: Okey Regal [place],  May 12, 2010 4:55 PM    Prescriptions: SIMVASTATIN 40 MG TABS (SIMVASTATIN) take one tablet at bedtime  #90 x 0   Entered by:   [name]   Authorized by:   Neena Rhymes MD   Signed by:   [name] on 05/13/2010   Method used:   Faxed to ...       [facility] (mail-order)             , [place]         Ph: [phone]       Fax: [phone]   RxID:   [id number]

## 2010-12-15 NOTE — Progress Notes (Signed)
Summary: xray results   Phone Note Outgoing Call   Summary of Call: Pls call Mr. Ryan Zhang and let him know that his chest x-ray is negative for pneumonia.  He should still see Dr. Corinda Gubler and complete Z-pak as we discussed. Tks Initial call taken by: Lemont Fillers FNP,  November 26, 2009 1:11 PM  Follow-up for Phone Call        spoke w/ patient aware of xray results and says he was unable to get in to see Dr. Corinda Gubler so scheduled f/u appt w/ [name]...........Marland KitchenDoristine Devoid  November 26, 2009 3:53 PM

## 2010-12-15 NOTE — Assessment & Plan Note (Signed)
Summary: cold//lch   Vital Signs:  Patient profile:   65 year old male Weight:      250 pounds O2 Sat:      98 % on Room air Temp:     98.1 degrees F oral Pulse rate:   88 / minute BP sitting:   124 / 80  (left arm)  Vitals Entered By: [name] CMA (September 02, 2010 11:21 AM)  O2 Flow:  Room air CC: cough and sinus congestion x2 days    History of Present Illness: 65 yo man here today for cough.  sxs started 2 days ago.  cough is dry and hacking.  + nasal congestion, HA, fatigue.  denies facial pain/pressure.  not currently using inhalers.  has chest tightness similar to asthma flare.  no fevers.  hx of similar- has underlying lung dz.  Current Medications (verified): 1)  Cymbalta 60 Mg Cpep (Duloxetine Hcl) .... Take One Tablet Daily 2)  Enalapril Maleate 2.5 Mg Tabs (Enalapril Maleate) .... Take One Tablet Daily 3)  Clonazepam 0.5 Mg Tabs (Clonazepam) .... Take One Tablet Qid 4)  Simvastatin 40 Mg Tabs (Simvastatin) .... Take One Tablet At Bedtime 5)  Fexofenadine Hcl 180 Mg Tabs (Fexofenadine Hcl) 6)  Aspirin 81 Mg Tbec (Aspirin) .... Take One Tablet Daily 7)  Meclizine Hcl 25 Mg  Tabs (Meclizine Hcl) .... Take One Tablet Daily As Needed For Vertigo 8)  Symbicort 80-4.5 Mcg/act Aero (Budesonide-Formoterol Fumarate) .... Two Puffs Twice Daily 9)  Flonase 50 Mcg/act  Susp (Fluticasone Propionate) .... Use Two Sprays in Each Nostril Twice Daily As Needed. 10)  Ec-Naprosyn 500 Mg Tbec (Naproxen) .[name].. 1 Tab By Mouth Two Times As Needed 11)  Saphris 5 Mg Subl (Asenapine Maleate) .[name].. 1 Tablet in Pm 12)  Onetouch Ultra Test  Strp (Glucose Blood) .... Pt Test Once Daily 13)  Lamictal Xr 50 Mg Xr24h-Tab (Lamotrigine) .[name].. 1 By Mouth Daily 14)  Ambien Cr 12.5 Mg Cr-Tabs (Zolpidem Tartrate) .[name].. 1 By Mouth As Needed 15)  Trazodone Hcl 50 Mg Tabs (Trazodone Hcl) .... 3 By Mouth As Needed 16)  Cogentin 2mg  .... 1 By Mouth Daily 17)  Gabapentin 300 Mg Caps (Gabapentin) .[name].. 1 Qam and  2 Qpm 18)  [facility] Spectravite Senior  Tabs (Multiple Vitamins-Minerals) .[name].. 1 By Mouth Daily 19)  Actoplus Met 15-850 Mg Tabs (Pioglitazone Hcl-Metformin Hcl) .... Take One Tablet Two Times A Day  Allergies (verified): 1)  ! Percocet 2)  ! Depakote 3)  ! Celexa 4)  ! Paxil 5)  ! Wellbutrin 6)  ! * Smz 7)  ! * Symbiax 8)  ! * Risperidone  Review of Systems      See HPI  Physical Exam  General:  Overweight white male in NAD Head:  Normocephalic and atraumatic without obvious abnormalities.  no TTP over sinuses Eyes:  no injxn or inflammation Ears:  External ear exam shows no significant lesions or deformities.  Otoscopic examination reveals clear canals, tympanic membranes are intact bilaterally without bulging, retraction, inflammation or discharge. Hearing is grossly normal bilaterally. Nose:  + congestion Mouth:  Oral mucosa and oropharynx without lesions or exudates.  involuntary oralmotor movements Lungs:  Normal respiratory effort, chest expands symmetrically. Lungs are clear to auscultation, no crackles or wheezes.  + hacking cough Heart:  Normal rate and regular rhythm. S1 and S2 normal without gallop, murmur, click, rub or other extra sounds.   Impression & Recommendations:  Problem # 1:  BRONCHITIS- ACUTE (ICD-466.0)  Assessment New pt w/ hacking cough and underlying asthma.  tx w/ zpack.  reviewed supportive care and red flags that should prompt return.  Pt expresses understanding and is in agreement w/ this plan. His updated medication list for this problem includes:    Symbicort 80-4.5 Mcg/act Aero (Budesonide-formoterol fumarate) .[name]..[name] Two puffs twice daily    Azithromycin 250 Mg Tabs (Azithromycin) .[name]... 2 by  mouth today and then 1 daily for 4 days    Tessalon 200 Mg Caps (Benzonatate) .[name]... Take one capsule by mouth three times a day as needed for cough  Complete Medication List: 1)  Cymbalta 60 Mg Cpep (Duloxetine hcl) .... Take one tablet daily 2)  Enalapril  Maleate 2.5 Mg Tabs (Enalapril maleate) .... Take one tablet daily 3)  Clonazepam 0.5 Mg Tabs (Clonazepam) .... Take one tablet qid 4)  Simvastatin 40 Mg Tabs (Simvastatin) .... Take one tablet at bedtime 5)  Fexofenadine Hcl 180 Mg Tabs (Fexofenadine hcl) 6)  Aspirin 81 Mg Tbec (Aspirin) .... Take one tablet daily 7)  Meclizine Hcl 25 Mg Tabs (Meclizine hcl) .... Take one tablet daily as needed for vertigo 8)  Symbicort 80-4.5 Mcg/act Aero (Budesonide-formoterol fumarate) .... Two puffs twice daily 9)  Flonase 50 Mcg/act Susp (Fluticasone propionate) .... Use two sprays in each nostril twice daily as needed. 10)  Ec-naprosyn 500 Mg Tbec (Naproxen) .[name].. 1 tab by mouth two times as needed 11)  Saphris 5 Mg Subl (Asenapine maleate) .[name].. 1 tablet in pm 12)  Onetouch Ultra Test Strp (Glucose blood) .... Pt test once daily 13)  Lamictal Xr 50 Mg Xr24h-tab (Lamotrigine) .[name].. 1 by mouth daily 14)  Ambien Cr 12.5 Mg Cr-tabs (Zolpidem tartrate) .[name].. 1 by mouth as needed 15)  Trazodone Hcl 50 Mg Tabs (Trazodone hcl) .... 3 by mouth as needed 16)  Cogentin 2mg   .... 1 by mouth daily 17)  Gabapentin 300 Mg Caps (Gabapentin) .[name].. 1 qam and 2 qpm 18)  [facility] Spectravite Senior Tabs (Multiple vitamins-minerals) .[name].. 1 by mouth daily 19)  Actoplus Met 15-850 Mg Tabs (Pioglitazone hcl-metformin hcl) .... Take one tablet two times a day 20)  Azithromycin 250 Mg Tabs (Azithromycin) .... 2 by  mouth today and then 1 daily for 4 days 21)  Tessalon 200 Mg Caps (Benzonatate) .... Take one capsule by mouth three times a day as needed for cough  Patient Instructions: 1)  Take the Azithromycin as directed for your bronchitis 2)  Restart your Symbicort.  Use the Albuterol inhaler as needed 3)  Use Mucinex to thin your congestion 4)  Use the Tessalon pills for cough 5)  Drink plenty of fluids 6)  Tylenol as needed for pain or fever 7)  Hang in there!!! Prescriptions: TESSALON 200 MG CAPS (BENZONATATE) Take one  capsule by mouth three times a day as needed for cough  #60 x 0   Entered and Authorized by:   Neena Rhymes MD   Signed by:   Neena Rhymes MD on 09/02/2010   Method used:   Electronically to        [facility] [phone]* (retail)       [address]       [place], [place]  [address]       Ph: [phone]       Fax: [phone]   RxID:   [id number] AZITHROMYCIN 250 MG  TABS (AZITHROMYCIN) 2 by  mouth today and then 1 daily  for 4 days  #6 x 0   Entered and Authorized by:   Neena Rhymes MD   Signed by:   Neena Rhymes MD on 09/02/2010   Method used:   [facility]ronically to        [facility] [phone]* (retail)       [address]       [place], [place]  [address]       Ph: [phone]       Fax: [phone]   RxID:   [id number]    Orders Added: 1)  Est. Patient Level III [34742]    History of Present Illness: 65 yo man here today for cough.  sxs started 2 days ago.  cough is dry and hacking.  + nasal congestion, HA, fatigue.  denies facial pain/pressure.  not currently using inhalers.  has chest tightness similar to asthma flare.  no fevers.  hx of similar- has underlying lung dz.

## 2010-12-15 NOTE — Assessment & Plan Note (Signed)
Summary: cough/kdc   Vital Signs:  Patient profile:   65 year old male Weight:      285.2 pounds Temp:     98.0 degrees F oral Pulse rate:   76 / minute Resp:     16 per minute BP sitting:   114 / 68  (left arm) Cuff size:   large  Vitals Entered By: Shonna Chock (October 14, 2009 2:32 PM) CC: Cough since last OV(08/22/2009) Comments REVIEWED MED LIST, PATIENT AGREED DOSE AND INSTRUCTION CORRECT    Primary Care Provider:  Neena Rhymes MD  CC:  Cough since last OV(08/22/2009).  History of Present Illness:  Cough      This is a 65 year old man who presents with Cough which initially improved after last OV but reucurred with another "cold".  The patient reports non-productive cough, shortness of breath, wheezing, and exertional dyspnea, but denies pleuritic chest pain, fever, hemoptysis, and malaise.  Associated symtpoms include weight loss and acid reflux symptoms.  The patient denies the following symptoms: cold/URI symptoms, sore throat, nasal congestion, and peripheral edema.  The cough is worse with exercise and lying down.  Ineffective prior treatments have included OTC cough medication and PPI(Prilosec OTC prn ) Risk factors include history of asthma and history of reflux.Albuterol as needed helps.  Nonsmoker. He is on ACE-I. FBS not checked.  Allergies: 1)  ! Percocet 2)  ! Depakote 3)  ! Celexa 4)  ! Paxil 5)  ! Wellbutrin 6)  ! * Smz 7)  ! Marlon Pel  Physical Exam  General:  Disheveled,in no acute distress; alert,appropriate and cooperative throughout examination Ears:  External ear exam shows no significant lesions or deformities.  Otoscopic examination reveals clear canals, tympanic membranes are intact bilaterally without bulging, retraction, inflammation or discharge. Hearing is grossly normal bilaterally. Nose:  External nasal examination shows no deformity or inflammation. Nasal mucosa are pink and moist without lesions or exudates. Mouth:  Oral mucosa and  oropharynx without lesions or exudates.  Slightly hoarse.pharyngeal erythema & crowding.   Lungs:  Normal respiratory effort, chest expands symmetrically. Lungs are clear to auscultation, no crackles or wheezes.Dry cough Cervical Nodes:  No lymphadenopathy noted Axillary Nodes:  No palpable lymphadenopathy   Impression & Recommendations:  Problem # 1:  BRONCHITIS-ACUTE (ICD-466.0)  His updated medication list for this problem includes:    Qvar 40 Mcg/act Aers (Beclomethasone dipropionate) ..... Use 2 puffs as needed in the morning    Azithromycin 250 Mg Tabs (Azithromycin) .[name]... As per pack  Problem # 2:  GERD (ICD-530.81)  Problem # 3:  ASTHMA, MILD, INTERMITTENT (ICD-493.90)  His updated medication list for this problem includes:    Qvar 40 Mcg/act Aers (Beclomethasone dipropionate) ..... Use 2 puffs as needed in the morning  Complete Medication List: 1)  Cymbalta 60 Mg Cpep (Duloxetine hcl) .... Take one tablet daily 2)  Enalapril Maleate 2.5 Mg Tabs (Enalapril maleate) .... Take one tablet daily 3)  Clonazepam 0.5 Mg Tabs (Clonazepam) .... Take one tablet qid 4)  Simvastatin 40 Mg Tabs (Simvastatin) .... Take one tablet at bedtime 5)  Fexofenadine Hcl 180 Mg Tabs (Fexofenadine hcl) 6)  Actoplus Met 15-500 Mg Tabs (Pioglitazone hcl-metformin hcl) .... Take one tablet twice daily 7)  Aspirin 81 Mg Tbec (Aspirin) .... Take one tablet daily 8)  Meclizine Hcl 25 Mg Tabs (Meclizine hcl) .... Take one tablet daily as needed for vertigo 9)  Qvar 40 Mcg/act Aers (Beclomethasone dipropionate) .... Use 2 puffs as  needed in the morning 10)  Flonase 50 Mcg/act Susp (Fluticasone propionate) .... Use two sprays in each nostril twice daily as needed. 11)  Ec-naprosyn 500 Mg Tbec (Naproxen) .[name].. 1 tab by mouth two times a day 12)  Saphris 5 Mg Subl (Asenapine maleate) .[name].. 1 tablet in pm 13)  Onetouch Ultra Test Strp (Glucose blood) .... Pt test once daily 14)  Lamictal 25 Mg Tabs (Lamotrigine)  .... Take one tablet bedtime 15)  Azithromycin 250 Mg Tabs (Azithromycin) .... As per pack  Patient Instructions: 1)  Drink as much fluid as you can tolerate for the next few days. 2)  Check your blood sugars regularly. If your readings are usually above :130 or below 70 you should contact our office.Symbicort 2 puffs two times a day in place of QVAR; gargle after use. Prilosec OTC two times a day pre meals. Prescriptions: AZITHROMYCIN 250 MG TABS (AZITHROMYCIN) as per pack  #1 x 0   Entered and Authorized by:   Marga Melnick MD   Signed by:   Marga Melnick MD on 10/14/2009   Method used:   Print then Give to Patient   RxID:   [id number]

## 2010-12-15 NOTE — Consult Note (Signed)
Summary: [facility]  [facility]   Imported By: Lanelle Bal 03/13/2009 10:45:56  _____________________________________________________________________  External Attachment:    Type:   Image     Comment:   External Document

## 2010-12-15 NOTE — Progress Notes (Signed)
Summary: urine sample  Phone Note Outgoing Call   Summary of Call: Would you please ask Mr Coate to return to complete urine microalbumin (250)  Thanks Initial call taken by: Lemont Fillers FNP,  November 27, 2009 10:03 PM  Follow-up for Phone Call        spoke w/ patient will stop by on monday to do urine sample....Marland KitchenMarland KitchenDoristine Devoid  November 28, 2009 2:41 PM

## 2010-12-15 NOTE — Assessment & Plan Note (Signed)
Summary: bp check rto 1 month/cbs   Vital Signs:  Patient profile:   65 year old male Weight:      238.6 pounds Pulse rate:   60 / minute BP sitting:   120 / 68  (left arm)  Vitals Entered By: [name] (Mar 21, 2009 10:59 AM) CC: 1 month roa    History of Present Illness: 65 yo man here today to f/u BP.  BP much better today.  pt denies HAs, N/V, CP, SOB, edema.  pt much more awake and alert today.  psych changed meds.  Allergies: 1)  ! Percocet 2)  ! Depakote 3)  ! Celexa 4)  ! Paxil 5)  ! Wellbutrin 6)  ! * Smz 7)  ! * Symbiax  Review of Systems      See HPI  Physical Exam  General:  awake and alert and well-developed.   Neck:  No deformities, masses, or tenderness noted. Lungs:  Normal respiratory effort, chest expands symmetrically. Lungs are clear to auscultation, no crackles or wheezes. Heart:  normal rate, regular rhythm, and no murmur.   Pulses:  +2 carotid, DP, radial Extremities:  No clubbing, cyanosis, edema, or deformity noted with normal full range of motion of all joints.     Impression & Recommendations:  Problem # 1:  ELEVATED BP W/O HYPERTENSION (ICD-796.2) pt's BP much better controlled today.  asymptomatic.  no need for changes at this time. His updated medication list for this problem includes:    Enalapril Maleate 2.5 Mg Tabs (Enalapril maleate) .[name]... Take one tablet daily  Problem # 2:  BIPOLAR DISORDER UNSPECIFIED (ICD-296.80) Assessment: Unchanged pt much more awake and alert than previous visit.  meds were changed.  pt feeling much better.  Complete Medication List: 1)  Cymbalta 60 Mg Cpep (Duloxetine hcl) .... Take one tablet daily 2)  Enalapril Maleate 2.5 Mg Tabs (Enalapril maleate) .... Take one tablet daily 3)  Clonazepam 0.5 Mg Tabs (Clonazepam) .... Take one tablet qid 4)  Simvastatin 40 Mg Tabs (Simvastatin) .... Take one tablet at bedtime 5)  Fexofenadine Hcl 180 Mg Tabs (Fexofenadine hcl) 6)  Actoplus Met 15-500 Mg Tabs  (Pioglitazone hcl-metformin hcl) .... Take one tablet twice daily 7)  Aspirin 81 Mg Tbec (Aspirin) .... Take one tablet daily 8)  Meclizine Hcl 25 Mg Tabs (Meclizine hcl) .... Take one tablet daily as needed for vertigo 9)  Qvar 40 Mcg/act Aers (Beclomethasone dipropionate) .... Use 2 puffs as needed in the morning 10)  Flonase 50 Mcg/act Susp (Fluticasone propionate) .... Use two sprays in each nostril twice daily as needed. 11)  Ec-naprosyn 500 Mg Tbec (Naproxen) .[name].. 1 tab by mouth two times a day 12)  Saphris 5 Mg Subl (Asenapine maleate) .[name].. 1 tablet in pm 13)  Onetouch Ultra Test Strp (Glucose blood) .... Pt test once daily 14)  Lamictal 25 Mg Tabs (Lamotrigine) .... Take one tablet bedtime  Patient Instructions: 1)  Follow up as scheduled in July for diabetes check 2)  Your blood pressure looks great!  No need to change meds at this time 3)  Call with any questions or concerns 4)  Happy Mothers Day (for your wife) and Happy Father's Day (a little early) Prescriptions: SIMVASTATIN 40 MG TABS (SIMVASTATIN) take one tablet at bedtime  #90 x 3   Entered and Authorized by:   Neena Rhymes MD   Signed by:   Neena Rhymes MD on 03/21/2009   Method used:   Print  then Give to Patient   RxID:   [id number]

## 2010-12-15 NOTE — Miscellaneous (Signed)
   Clinical Lists Changes  Observations: Added new observation of H1N1#1 VAC: Influenza virus vaccine- pandemic formulation (H1N1) (11/14/2008 13:35)      Other Immunization History:    H1N1 # 1:  Influenza virus vaccine- pandemic formulation (H1N1) (11/14/2008) given at [facility]

## 2010-12-15 NOTE — Letter (Signed)
Summary: [facility]  [facility]   Imported By: Roderic Ovens 02/17/2010 16:17:59  _____________________________________________________________________  External Attachment:    Type:   Image     Comment:   External Document

## 2010-12-15 NOTE — Assessment & Plan Note (Signed)
Summary: cpx & lab.cbs   Vital Signs:  Patient Profile:   65 Years Old Male Height:     70 inches Weight:      250 pounds Temp:     97.8 degrees F oral Pulse rate:   74 / minute Resp:     16 per minute BP sitting:   120 / 82  (right arm)  Pt. in pain?   no  Vitals Entered By: Ardyth Man (January 10, 2008 9:36 AM)               Vision Screening: Left eye with correction: 20 / 20 Right eye with correction: 20 / 20 Both eyes with correction: 20 / 10        Vision Entered By: Ardyth Man (January 10, 2008 9:39 AM)     Chief Complaint:  CPX-Fasting labs.  General Medical HPI:      His prior problems were reviewed.    His list of medications were reviewed in the office today.  He is doing well at this time and is not having any significant new complaints.    Current Allergies: ! PERCOCET ! DEPAKOTE ! CELEXA ! PAXIL ! WELLBUTRIN ! * SMZ ! * SYMBIAX  Past Medical History:    Reviewed history from 02/22/2007 and no changes required:       Diabetes mellitus, type II       Hyperlipidemia       Hypertension       Bipolar  Past Surgical History:    Reviewed history from 02/22/2007 and no changes required:       Cholecystectomy   Family History:    DM    Breast Cancer  Social History:    Occupation: disability    Married    Never Smoked    Alcohol use-no   Risk Factors:  Tobacco use:  never Alcohol use:  no   Review of Systems  The patient denies anorexia, fever, weight loss, weight gain, chest pain, syncope, dyspnea on exhertion, peripheral edema, prolonged cough, hemoptysis, abdominal pain, melena, hematochezia, severe indigestion/heartburn, hematuria, incontinence, and muscle weakness.     Physical Exam  General:     overweight-appearing.   Head:     Normocephalic and atraumatic without obvious abnormalities. Eyes:     No corneal or conjunctival inflammation noted. EOMI. Perrla. Funduscopic exam benign, without hemorrhages,  exudates or papilledema. Vision grossly normal. Ears:     External ear exam shows no significant lesions or deformities.  Otoscopic examination reveals clear canals, tympanic membranes are intact bilaterally without bulging, retraction, inflammation or discharge. Hearing is grossly normal bilaterally. Nose:     External nasal examination shows no deformity or inflammation. Nasal mucosa are pink and moist without lesions or exudates. Mouth:     Oral mucosa and oropharynx without lesions or exudates.   Neck:     No deformities, masses, or tenderness noted. Chest Wall:     No deformities, masses, tenderness or gynecomastia noted. Lungs:     Normal respiratory effort, chest expands symmetrically. Lungs are clear to auscultation, no crackles or wheezes. Heart:     Normal rate and regular rhythm. S1 and S2 normal without gallop, murmur, click, rub or other extra sounds. Abdomen:     Bowel sounds positive,abdomen soft and non-tender without masses, organomegaly or hernias noted. Rectal:     No external abnormalities noted. Normal sphincter tone. No rectal masses or tenderness. Genitalia:     Testes bilaterally descended without nodularity,  tenderness or masses. No scrotal masses or lesions. No penis lesions or urethral discharge. Prostate:     Prostate gland firm and smooth, no enlargement, nodularity, tenderness, mass, asymmetry or induration. Msk:     No deformity or scoliosis noted of thoracic or lumbar spine.   Pulses:     R and L carotid,radial,femoral,dorsalis pedis and posterior tibial pulses are full and equal bilaterally Extremities:     No clubbing, cyanosis, edema, or deformity noted with normal full range of motion of all joints.   Neurologic:     No cranial nerve deficits noted. Station and gait are normal. Plantar reflexes are down-going bilaterally. DTRs are symmetrical throughout. Sensory, motor and coordinative functions appear intact. Skin:     Intact without suspicious  lesions or rashes Cervical Nodes:     No lymphadenopathy noted Inguinal Nodes:     No significant adenopathy Psych:     Cognition and judgment appear intact. Alert and cooperative with normal attention span and concentration. No apparent delusions, illusions, hallucinations    Impression & Recommendations:  Problem # 1:  PREVENTIVE HEALTH CARE (ICD-V70.0) 1. Health promotion and screening reviewed 2. Patient info provided  Reviewed preventive care protocols, scheduled due services, and updated immunizations.  Orders: Venipuncture (41660) Venipuncture (63016) TLB-PSA (Prostate Specific Antigen) (84153-PSA)   Problem # 2:  DIABETES MELLITUS, TYPE II (ICD-250.00) Previously at goal F/u in 4 motnhs and as needed with Dr. Laury Axon or new PCP. His updated medication list for this problem includes:    Enalapril Maleate 2.5 Mg Tabs (Enalapril maleate) .[name]... Take one tablet daily    Actoplus Met 15-500 Mg Tabs (Pioglitazone hcl-metformin hcl) .[name]... Take one tablet twice daily    Adprin B 325 Mg Tabs (Aspirin buf(cacarb-mgcarb-mgo)) .[name]... Take one tablet daily  Labs Reviewed: HgBA1c: 5.9 (10/09/2007)   Creat: 1.1 (08/31/2006)     Orders: TLB-Microalbumin/Creat Ratio, Urine (82043-MALB) TLB-A1C / Hgb A1C (Glycohemoglobin) (83036-A1C) TLB-Glucose, QUANT (82947-GLU)   Problem # 3:  HYPERLIPIDEMIA (ICD-272.4) At goal His updated medication list for this problem includes:    Advicor 500-20 Mg Tb24 (Niacin-lovastatin) .[name]... Take 2 tablet by mouth once a day  Labs Reviewed: Chol: 130 (08/31/2006)   HDL: 48.9 (08/31/2006)   LDL: 69 (08/31/2006)   TG: 59 (08/31/2006) SGOT: 25 (08/31/2006)   SGPT: 19 (08/31/2006)  Orders: TLB-ALT (SGPT) (84460-ALT) TLB-AST (SGOT) (84450-SGOT) TLB-Lipid Panel (80061-LIPID)   Complete Medication List: 1)  Cymbalta 60 Mg Cpep (Duloxetine hcl) 2)  Geodon 60 Mg Caps (Ziprasidone hcl) .... Take one capsule daily 3)  Enalapril Maleate 2.5 Mg Tabs  (Enalapril maleate) .... Take one tablet daily 4)  Clonazepam 0.5 Mg Tabs (Clonazepam) 5)  Gabapentin 300 Mg Caps (Gabapentin) 6)  Advicor 500-20 Mg Tb24 (Niacin-lovastatin) .... Take 2 tablet by mouth once a day 7)  Benztropine Mesylate 2 Mg Tabs (Benztropine mesylate) 8)  Fexofenadine Hcl 180 Mg Tabs (Fexofenadine hcl) 9)  Actoplus Met 15-500 Mg Tabs (Pioglitazone hcl-metformin hcl) .... Take one tablet twice daily 10)  Ambien Cr 12.5 Mg Tbcr (Zolpidem tartrate) .... Take one tablet at bedtime as needed 11)  Cogentin 1 Mg/ml Soln (Benztropine mesylate) .... Take twice daily 12)  Adprin B 325 Mg Tabs (Aspirin buf(cacarb-mgcarb-mgo)) .... Take one tablet daily 13)  Meclizine Hcl 25 Mg Tabs (Meclizine hcl) .... Take one tablet daily as needed for vertigo 14)  Qvar 40 Mcg/act Aers (Beclomethasone dipropionate) .... Use 2 puffs as needed in the morning 15)  Flonase 50 Mcg/act Susp (Fluticasone propionate) .Marland KitchenMarland KitchenMarland Kitchen  Use two sprays in each nostril twice daily as needed.     ]

## 2010-12-15 NOTE — Letter (Signed)
Summary: Results Follow up Letter  [name] [place]  [address] [place], [place] [address]   Phone: [phone]  Fax: [phone]    11/21/2008 MRN: [id number]  Ryan Zhang [address] [place], [place]  [address]  Dear Mr. HOTTEL,  The following are the results of your recent test(s):  Test         Result    Pap Smear:        Normal _____  Not Normal _____ Comments: ______________________________________________________ Cholesterol: LDL(Bad cholesterol):         Your goal is less than:         HDL (Good cholesterol):       Your goal is more than: Comments:  ______________________________________________________ Mammogram:        Normal _____  Not Normal _____ Comments:  ___________________________________________________________________ Hemoccult:        Normal _____  Not normal _______ Comments:    _____________________________________________________________________ Other Tests: PLEASE SEE ATTACHED LABS FROM 11/21/07- labs great- keep up the good work!    We routinely do not discuss normal results over the telephone.  If you desire a copy of the results, or you have any questions about this information we can discuss them at your next office visit.   Sincerely,

## 2010-12-15 NOTE — Letter (Signed)
Summary: Results Follow up Letter  [facility] [place]  [address] [place], [place] [address]   Phone: [phone]  Fax: [phone]    02/26/2009 MRN: [id number]  Ryan Zhang [address] [place], [place]  [address]  Dear [name],  The following are the results of your recent test(s):  Test         Result    Pap Smear:        Normal _____  Not Normal _____ Comments: ______________________________________________________ Cholesterol: LDL(Bad cholesterol):         Your goal is less than:         HDL (Good cholesterol):       Your goal is more than: Comments:  ______________________________________________________ Mammogram:        Normal _____  Not Normal _____ Comments:  ___________________________________________________________________ Hemoccult:        Normal __X___  Not normal _______ Comments:    _____________________________________________________________________ Other Tests:    We routinely do not discuss normal results over the telephone.  If you desire a copy of the results, or you have any questions about this information we can discuss them at your next office visit.   Sincerely,

## 2010-12-15 NOTE — Letter (Signed)
Summary: Colonoscopy Letter  [facility]  [address] [place], [place] [address]   Phone: [phone]  Fax: [phone]      November 17, 2009 MRN: [id number]   Ryan Zhang [address] [place], [place]  [address]   Dear Mr. KEGLEY,   According to your medical record, it is time for you to schedule a Colonoscopy. The American Cancer Society recommends this procedure as a method to detect early colon cancer. Patients with a family history of colon cancer, or a personal history of colon polyps or inflammatory bowel disease are at increased risk.  This letter has beeen generated based on the recommendations made at the time of your procedure. If you feel that in your particular situation this may no longer apply, please contact our office.  Please call our office at [phone] to schedule this appointment or to update your records at your earliest convenience.  Thank you for cooperating with [place] to provide you with the very best care possible.   Sincerely,  Vania Rea. Jarold Motto, M.D.  [facility] [phone]

## 2010-12-15 NOTE — Procedures (Addendum)
Summary: Colonoscopy  Patient: Ryan Zhang Note: All result statuses are Final unless otherwise noted.  Tests: (1) Colonoscopy (COL)   COL Colonoscopy           DONE (C)     [facility]     [address]     [place], [place]  [address]           COLONOSCOPY PROCEDURE REPORT           PATIENT:  Ryan Zhang, Ryan Zhang  MR#:  [id number]     BIRTHDATE:  [birth year], 64 yrs. old  GENDER:  male     ENDOSCOPIST:  Vania Rea. Jarold Motto, MD, [facility]     REF. BY:     PROCEDURE DATE:  09/30/2010     PROCEDURE:  Higher-risk screening colonoscopy G0105           ASA CLASS:  Class III     INDICATIONS:  history of polyps     MEDICATIONS:   Fentanyl 75 mcg IV, Versed 8 mg IV           DESCRIPTION OF PROCEDURE:   After the risks benefits and     alternatives of the procedure were thoroughly explained, informed     consent was obtained.  Digital rectal exam was performed and     revealed no abnormalities.   The [id number] endoscope was     introduced through the anus and advanced to the cecum, which was     identified by both the appendix and ileocecal valve, without     limitations.  The quality of the prep was excellent, using     MoviPrep.  The instrument was then slowly withdrawn as the colon     was fully examined.     <<PROCEDUREIMAGES>>           FINDINGS:  Scattered diverticula were found in the sigmoid colon.     No polyps or cancers were seen.  This was otherwise a normal     examination of the colon.   Retroflexed views in the rectum     revealed no abnormalities.    The scope was then withdrawn from     the patient and the procedure completed.           COMPLICATIONS:  None     ENDOSCOPIC IMPRESSION:     1) Diverticula, scattered in the sigmoid colon     2) No polyps or cancers     3) Otherwise normal examination     RECOMMENDATIONS:     1) high fiber diet     2) Continue current colorectal screening recommendations for     "routine risk" patients with a repeat colonoscopy in 10  years.     REPEAT EXAM:  No           ______________________________     Vania Rea. Jarold Motto, MD, [facility]           CC:  Sheliah Hatch, MD           n.     REVISED:  09/30/2010 04:42 PM     eSIGNED:   Vania Rea. Kimari Lienhard at 09/30/2010 04:42 PM           Leanord Asal, [id number]  Note: An exclamation mark (!) indicates a result that was not dispersed into the flowsheet. Document Creation Date: 09/30/2010 4:42 PM _______________________________________________________________________  (1) Order result status: Final Collection or observation date-time: 09/30/2010 11:12 Requested date-time:  Receipt  date-time:  Reported date-time:  Referring Physician:   Ordering Physician: Sheryn Bison [phone]) Specimen Source:  Source: Launa Grill Order Number: [id number] Lab site:

## 2010-12-15 NOTE — Assessment & Plan Note (Signed)
Summary: SURGICAL CLEARANCE/CDJ   Vital Signs:  Patient profile:   65 year old male Weight:      282.13 pounds BMI:     41.81 Temp:     97.1 degrees F oral Pulse rate:   100 / minute Pulse rhythm:   regular Resp:     20 per minute BP sitting:   120 / 78  (left arm) Cuff size:   large  Vitals Entered By: Mervin Kung CMA (January 14, 2010 9:08 AM) CC: room 16  Needs surgical clearance right TKA.   Primary Care Provider:  Neena Rhymes MD  CC:  room 16  Needs surgical clearance right TKA.[name]  History of Present Illness: [name] is a 65 year old male who present today with complaint of right knee pain.  He sees Dr. Thomasena Edis for surgery and today presents for pre-operative clearance for TKA.  His only complaint today knee pain. Tells me that he has "lost" his prescription for hydrocodone which was given by his orthopedist and asks me if I can refill pain med for him.    Allergies: 1)  ! Percocet 2)  ! Depakote 3)  ! Celexa 4)  ! Paxil 5)  ! Wellbutrin 6)  ! * Smz 7)  ! * Symbiax 8)  ! * Risperidone  Review of Systems       Constitutional: Denies Fever ENT:  Denies nasal congestion or sore throat. Resp: Denies cough CV:  Denies Chest Pain, SOB only when "asthma is acting up" GI:  Denies nausea or vomitting GU: Denies dysuria Lymphatic: Denies lymphadenopathy Musculoskeletal:  bilateral knee pain right greater than left.   Skin:  Denies Rashes or concerning lesions Psychiatric: takes gabapentin and klonopin for anxiety, well controlled, depression well controlled as well. Neuro: Denies numbness     Physical Exam  General:  Overweight white male in NAD Head:  Normocephalic and atraumatic without obvious abnormalities. No apparent alopecia or balding. Eyes:  PERRLA Ears:  External ear exam shows no significant lesions or deformities.  Otoscopic examination reveals clear canals, tympanic membranes are intact bilaterally without bulging, retraction, inflammation or  discharge. Hearing is grossly normal bilaterally. Neck:  No deformities, masses, or tenderness noted. Lungs:  Normal respiratory effort, chest expands symmetrically. Lungs are clear to auscultation, no crackles or wheezes. Heart:  Normal rate and regular rhythm. S1 and S2 normal without gallop, murmur, click, rub or other extra sounds. Abdomen:  Bowel sounds positive,abdomen soft and non-tender without masses, organomegaly or hernias noted. Extremities:  No clubbing, cyanosis, edema, or deformity noted with normal full range of motion of all joints.   Neurologic:  alert & oriented X3.  Difficulty standing due to knee pain.   Impression & Recommendations:  Problem # 1:  PREOPERATIVE EXAMINATION (ICD-V72.84) Assessment New  EKG abnormal- Will refer to cardiology for cardiac clearance, TW inversions v1-v4 (new v4 TWI compared to EKG 03/31/09) Patient has multiple risk factors for CAD and this needs to be evaluated prior to surgical clearance.  Plan to check urine culture today.  Other laboratories were checked recently and were stable.  If cleared by cardiology from a cardiac standpoint, then  patient can be cleared for surgery from a medical standpoint.  Patient was unhappy with need for additional pre-op evaluation, but I explained to him that it could be very dangerous for him to undergo such a surgery if he has underlying CAD which is not diagnosed.  Also, I advised patient to follow up with  ortho for script for pain meds as they are the prescribers.  Orders: T-Culture, Urine ([id number])  Problem # 2:  DIABETES MELLITUS, TYPE II (ICD-250.00) Assessment: Unchanged Reasonable control, check urine for microalbumin His updated medication list for this problem includes:    Enalapril Maleate 2.5 Mg Tabs (Enalapril maleate) .[name]... Take one tablet daily    Actoplus Met 15-500 Mg Tabs (Pioglitazone hcl-metformin hcl) .[name]... Take one tablet twice daily    Aspirin 81 Mg Tbec (Aspirin) .[name]... Take one  tablet daily  Labs Reviewed: Creat: 1.1 (11/26/2009)    Reviewed HgBA1c results: 6.8 (11/26/2009)  6.5 (08/22/2009)  Problem # 3:  ELEVATED BP W/O HYPERTENSION (ICD-796.2) Assessment: Improved  His updated medication list for this problem includes:    Enalapril Maleate 2.5 Mg Tabs (Enalapril maleate) .[name]... Take one tablet daily  BP today: 120/78 Prior BP: 132/80 (11/26/2009)  Labs Reviewed: Creat: 1.1 (11/26/2009) Chol: 144 (11/26/2009)   HDL: 44.90 (11/26/2009)   LDL: 79 (11/26/2009)   TG: 102.0 (11/26/2009)  Instructed in low sodium diet (DASH Handout) and behavior modification.    Complete Medication List: 1)  Cymbalta 60 Mg Cpep (Duloxetine hcl) .... Take one tablet daily 2)  Enalapril Maleate 2.5 Mg Tabs (Enalapril maleate) .... Take one tablet daily 3)  Clonazepam 0.5 Mg Tabs (Clonazepam) .... Take one tablet qid 4)  Simvastatin 40 Mg Tabs (Simvastatin) .... Take one tablet at bedtime 5)  Fexofenadine Hcl 180 Mg Tabs (Fexofenadine hcl) 6)  Actoplus Met 15-500 Mg Tabs (Pioglitazone hcl-metformin hcl) .... Take one tablet twice daily 7)  Aspirin 81 Mg Tbec (Aspirin) .... Take one tablet daily 8)  Meclizine Hcl 25 Mg Tabs (Meclizine hcl) .... Take one tablet daily as needed for vertigo 9)  Symbicort 80-4.5 Mcg/act Aero (Budesonide-formoterol fumarate) .... Two puffs twice daily 10)  Flonase 50 Mcg/act Susp (Fluticasone propionate) .... Use two sprays in each nostril twice daily as needed. 11)  Ec-naprosyn 500 Mg Tbec (Naproxen) .[name].. 1 tab by mouth two times a day 12)  Saphris 5 Mg Subl (Asenapine maleate) .[name].. 1 tablet in pm 13)  Onetouch Ultra Test Strp (Glucose blood) .... Pt test once daily 14)  Lamictal 25 Mg Tabs (Lamotrigine) .... Take one tablet bedtime 15)  Celebrex 200 Mg Caps (Celecoxib) .[name].. 1 two times a day as needed 16)  Vicodin 5-500 Mg Tabs (Hydrocodone-acetaminophen) .... Take one tablet every four hours as needed  Other Orders: TLB-Microalbumin/Creat  Ratio, Urine ([id number]) Cardiology Referral (Cardiology)  Patient Instructions: 1)  You will be contacted about your referral to cardiology for pre-operative clearance.  Current Allergies (reviewed today): ! PERCOCET ! DEPAKOTE ! CELEXA ! PAXIL ! WELLBUTRIN ! * SMZ ! * SYMBIAX ! * RISPERIDONE

## 2010-12-15 NOTE — Letter (Signed)
Summary: [facility]  [facility]   Imported By: Lanelle Bal 12/24/2009 09:14:21  _____________________________________________________________________  External Attachment:    Type:   Image     Comment:   External Document

## 2010-12-15 NOTE — Letter (Signed)
Summary: [facility] [place]   Imported By: Lanelle Bal 08/14/2009 12:14:16  _____________________________________________________________________  External Attachment:    Type:   Image     Comment:   External Document

## 2010-12-15 NOTE — Progress Notes (Signed)
Summary: NEEDS PAIN PILLS UNTIL APPT ON 2/23  Phone Note Call from Patient Call back at [facility] Phone [phone]   Caller: Patient Summary of Call: PATIENT CALLED BECAUSE HIS DR Thomasena Edis AT [facility] IS OUT AND PATIENT IS IN A LOT OF PAIN DUE TO ARTHRITIS IN KNEE---THEY SAID SINCE DR COLLINS WAS OUT, NO OTHER DOCTOR AT [facility] WOULD PRESCRIBE PAIN PILLS--THEY SUGGESTED THAT HE CONTACT HIS PRIMARY PHYSICIAN  (WHO IS DR TABORI) TO SEE IF WE WOULD PRESCRIBE  SOME PAIN PILLS FOR HIM UNTIL HIS APPOINTMENT WITH DR Thomasena Edis ON WED 01/07/2010  I TOLD HIM THAT I WOULD SEND THIS MESSAGE TO THE TRIAGE NURSE, BUT DUE TO THE HOUR (ABOUT 4:10) THERE WAS A POSSIBLILITY THAT SHE WOULD NOT BE ABLE TO GET HIM AN ANSWER  i SUGGESTED THAT, IF HE DID NOT GET A CALL, THAT WE DID HAVE THE WALK-IN [facility] FROM [place] LONG TOMORROW MORNING Initial call taken by: Jerolyn Shin,  January 02, 2010 4:24 PM  Follow-up for Phone Call        spoke w/ patient says he contacted Dr. Thomasena Edis office but he was out of office today says he was on low dose of hydrocodone for arthritis and now it's flaring up really bad has upcoming appt on 01/07/10 and none of the orthopedics in the office would fill prescription. .......Marland KitchenDoristine Devoid  January 02, 2010 4:57 PM     New/Updated Medications: CELEBREX 200 MG CAPS (CELECOXIB) 1 two times a day as needed Prescriptions: CELEBREX 200 MG CAPS (CELECOXIB) 1 two times a day as needed  #30 x 0   Entered by:   Marga Melnick MD   Authorized by:   [name]   Signed by:   Marga Melnick MD on 01/02/2010   Method used:   Faxed to ...       [facility] [phone][facility] (retail)       [address]       [place], [place]  [address]       Ph: [phone]       Fax: [phone]   RxID:   [id number]

## 2010-12-15 NOTE — Assessment & Plan Note (Signed)
Summary: NEW [name]-DR. A PT/CDJ   Vital Signs:  Patient Profile:   65 Years Old Male Height:     70 inches Weight:      254 pounds Pulse rate:   74 / minute Resp:     18 per minute BP sitting:   118 / 84  (left arm)  Vitals Entered By: [name] (August 19, 2008 10:20 AM)                 PCP:  Laury Axon  Chief Complaint:  new est 4 month roa-need refill on meds.  History of Present Illness: 65 yo man who returns today for f/u and med refills. 1) DM- pt not checking CBGs, relies on A1Cs to assess control.  Denies HAs, visual changes, N/V, edema.  taking medicines w/out problems.  avoids sweets, doesn't exercise, has some numbness in his toes which he states is old and not changing (R>L) and none in his hands.  2) Hyperlipidemia- Pt taking medication w/out myalgias or GI upset.  3) Hip pain- Pt c/o R hip pain which is located laterally.  TTP.  Pain has been worsening over the last 2-3 months.  ASA takes the edge off.  Pt finds it is painful to sleep on that side.  States that after sleeping on R side finds it difficult to wt bear due to pain.  Doesn't feel his motion is limited.    Current Allergies: ! PERCOCET ! DEPAKOTE ! CELEXA ! PAXIL ! WELLBUTRIN ! * SMZ ! * SYMBIAX  Past Medical History:    Reviewed history from 02/22/2007 and no changes required:       Diabetes mellitus, type II       Hyperlipidemia       Hypertension       Bipolar     Review of Systems      See HPI   Physical Exam  General:     Well-developed,well-nourished,in no acute distress; alert,appropriate and cooperative throughout examination Head:     Normocephalic and atraumatic without obvious abnormalities. Neck:     No deformities, masses, or tenderness noted. Lungs:     Normal respiratory effort, chest expands symmetrically. Lungs are clear to auscultation, no crackles or wheezes. Heart:     normal rate, regular rhythm, and no murmur.   Abdomen:     Bowel sounds  positive,abdomen soft and non-tender without masses, organomegaly or hernias noted. Msk:     Pt w/ TTP over R greater trochanter Full ROM of R hip, no pain w/ internal or external rotation, flexion or extension Pulses:     +2 carotid, DP, radial Extremities:     No clubbing, cyanosis, edema, or deformity noted with normal full range of motion of all joints.   Neurologic:     gait WNL Psych:     Cognition and judgment appear intact. Alert and cooperative with normal attention span and concentration. No apparent delusions, illusions, hallucinations    Impression & Recommendations:  Problem # 1:  DIABETES MELLITUS, TYPE II (ICD-250.00) Assessment: Unchanged Pt denies any symptomatic lows and is not having any problems w/ his medications.  Will check A1C today and adjust as needed. His updated medication list for this problem includes:    Enalapril Maleate 2.5 Mg Tabs (Enalapril maleate) .[name]... Take one tablet daily    Actoplus Met 15-500 Mg Tabs (Pioglitazone hcl-metformin hcl) .[name]... Take one tablet twice daily    Adprin B 325 Mg Tabs (Aspirin  buf(cacarb-mgcarb-mgo)) .[name]... Take one tablet daily  Orders: Venipuncture ([id number]) TLB-A1C / Hgb A1C (Glycohemoglobin) ([id number]-A1C)   Problem # 2:  HYPERLIPIDEMIA (ICD-272.4) Assessment: Unchanged Pt w/out GI upset or Myalgias.  Will continue current meds.  Will check FLP at next visit. His updated medication list for this problem includes:    Advicor 500-20 Mg Tb24 (Niacin-lovastatin) .[name]... Take 2 tablet by mouth once a day   Problem # 3:  HIP PAIN, RIGHT (ICD-719.45) Assessment: New Pt w/ likely R greater trochanteric bursitis as he has no groin pain, full ROM and is point tender.  Will start pt on course of NSAIDs but cautioned pt that ASA and NSAIDs can both cause bleeding and that he needs to decrease to 81 mg ASA at least while on his Naprosyn.  Pt expresses understanding and is in agreement w/ this plan.  Pt to also use ice or heat as  needed for symptomatic relief. His updated medication list for this problem includes:    Adprin B 325 Mg Tabs (Aspirin buf(cacarb-mgcarb-mgo)) .[name]... Take one tablet daily    Ec-naprosyn 500 Mg Tbec (Naproxen) .[name]... 1 tab by mouth two times a day x 10 days and then as needed for hip pain   Complete Medication List: 1)  Cymbalta 60 Mg Cpep (Duloxetine hcl) 2)  Enalapril Maleate 2.5 Mg Tabs (Enalapril maleate) .... Take one tablet daily 3)  Clonazepam 0.5 Mg Tabs (Clonazepam) 4)  Gabapentin 300 Mg Caps (Gabapentin) 5)  Advicor 500-20 Mg Tb24 (Niacin-lovastatin) .... Take 2 tablet by mouth once a day 6)  Fexofenadine Hcl 180 Mg Tabs (Fexofenadine hcl) 7)  Actoplus Met 15-500 Mg Tabs (Pioglitazone hcl-metformin hcl) .... Take one tablet twice daily 8)  Adprin B 325 Mg Tabs (Aspirin buf(cacarb-mgcarb-mgo)) .... Take one tablet daily 9)  Meclizine Hcl 25 Mg Tabs (Meclizine hcl) .... Take one tablet daily as needed for vertigo 10)  Qvar 40 Mcg/act Aers (Beclomethasone dipropionate) .... Use 2 puffs as needed in the morning 11)  Flonase 50 Mcg/act Susp (Fluticasone propionate) .... Use two sprays in each nostril twice daily as needed. 12)  Ec-naprosyn 500 Mg Tbec (Naproxen) .[name].. 1 tab by mouth two times a day x 10 days and then as needed for hip pain 13)  Geodon 80 Mg Caps (Ziprasidone hcl) .... 2 tabs daily    Patient Instructions: 1)  Please schedule a follow-up appointment in 3 months. 2)  Call if your hip pain is no better in next 2-3 weeks 3)  Take the Naprosyn twice daily as directed 4)  Apply ice or heat to the hip as needed for pain 5)  STOP your 325mg  Aspirin and start an 81 mg Aspirin daily (at least while on the Naprosyn) 6)  Call if you have any problems!   Prescriptions: EC-NAPROSYN 500 MG TBEC (NAPROXEN) 1 TAB by mouth two times a day X 10 DAYS AND THEN AS NEEDED FOR HIP PAIN  #60 x 0   Entered by:   [name]   Authorized by:   Neena Rhymes MD   Signed by:   [name] on 08/19/2008   Method used:   Print then Give to Patient   RxID:   [id number] ACTOPLUS MET 15-500 MG TABS (PIOGLITAZONE HCL-METFORMIN HCL) TAKE ONE TABLET TWICE DAILY  #180 x 3   Entered by:   [name]   Authorized by:   Neena Rhymes MD   Signed by:   [name] on 08/19/2008   Method used:  Print then Give to Patient   RxID:   [id number] ADVICOR 500-20 MG TB24 (NIACIN-LOVASTATIN) Take 2 tablet by mouth once a day  #180 x 3   Entered by:   [name]   Authorized by:   Neena Rhymes MD   Signed by:   [name] on 08/19/2008   Method used:   Print then Give to Patient   RxID:   [id number] ENALAPRIL MALEATE 2.5 MG TABS (ENALAPRIL MALEATE) TAKE ONE TABLET DAILY  #90 x 3   Entered by:   [name]   Authorized by:   Neena Rhymes MD   Signed by:   [name] on 08/19/2008   Method used:   Print then Give to Patient   RxID:   [id number]  ]

## 2010-12-15 NOTE — Letter (Signed)
Summary: Handout Printed  Printed Handout:  - *[facility] Patient Instructions 

## 2010-12-15 NOTE — Letter (Signed)
Summary: Letter Concerning Diabetes & Eye Exam/[employer]  Letter Concerning Diabetes & Eye Exam/[employer]   Imported By: Lanelle Bal 12/16/2008 11:41:25  _____________________________________________________________________  External Attachment:    Type:   Image     Comment:   External Document

## 2010-12-15 NOTE — Letter (Signed)
Summary: Results Follow-up Letter  [place] [facility]  [address] [place], [place] [address]   Phone: [phone]  Fax: [phone]    05/16/2008        Ryan Zhang [address] [place], [place]  [address]  Dear [name],   The following are the results of your recent test(s):  Test     Result     Pap Smear    Normal_______  Not Normal_____       Comments: _________________________________________________________ Cholesterol LDL(Bad cholesterol):          Your goal is less than:         HDL (Good cholesterol):        Your goal is more than: _________________________________________________________ Other Tests:   _________________________________________________________  Please call for an appointment Or __Please see attached lab report._______________________________________________________ _________________________________________________________ _________________________________________________________  Sincerely,  Ardyth Man [place] at [facility]

## 2010-12-15 NOTE — Assessment & Plan Note (Signed)
Summary: sore throat,coughing,asthma//tl   Vital Signs:  Patient Profile:   65 Years Old Male Weight:      255.13 pounds Temp:     97.5 degrees F oral Pulse rate:   74 / minute Resp:     16 per minute BP sitting:   118 / 80  (right arm)  Pt. in pain?   no  Vitals Entered By: Ardyth Man (August 11, 2007 9:13 AM)                  Chief Complaint:  SORE THROAT, COUGHING, and URI symptoms.  History of Present Illness:  URI Symptoms      This is a 65 year old man who presents with URI symptoms.  The symptoms began duration > 5 days ago.  The patient reports nasal congestion, sore throat, productive cough, and sick contacts, but denies earache.  The patient denies fever and wheezing.  The patient denies itchy watery eyes, itchy throat, and sneezing.   Reports worse symptoms is the nasal congestion. Cough is productive. No SOB        Physical Exam  General:     overweight-appearing.   Nose:     Swollen turbinates with clear nasal discharge Mouth:     Oral mucosa and oropharynx without lesions or exudates.  Mildly red Neck:     No deformities, masses, or tenderness noted. Lungs:     Normal respiratory effort, chest expands symmetrically. Lungs are clear to auscultation, no crackles or wheezes. Heart:     Normal rate and regular rhythm. S1 and S2 normal without gallop, murmur, click, rub or other extra sounds.    Impression & Recommendations:  Problem # 1:  URI (ICD-465.9)   Instructed on symptomatic treatment. Call if symptoms persist or worsen.   His updated medication list for this problem includes:    Fexofenadine Hcl 180 Mg Tabs (Fexofenadine hcl)   Complete Medication List: 1)  Cymbalta 60 Mg Cpep (Duloxetine hcl) 2)  Geodon 80 Mg Caps (Ziprasidone hcl) 3)  Enalapril Maleate 2.5 Mg Tabs (Enalapril maleate) .... Take one tablet daily 4)  Clonazepam 0.5 Mg Tabs (Clonazepam) 5)  Gabapentin 300 Mg Caps (Gabapentin) 6)  Advicor 500-20 Mg Tb24  (Niacin-lovastatin) .... Take 2 tablet by mouth once a day 7)  Benztropine Mesylate 2 Mg Tabs (Benztropine mesylate) 8)  Fexofenadine Hcl 180 Mg Tabs (Fexofenadine hcl) 9)  Actoplus Met 15-500 Mg Tabs (Pioglitazone hcl-metformin hcl) .... Take one tablet twice daily 10)  Augmentin 875-125 Mg Tabs (Amoxicillin-pot clavulanate) .[name].. 1 by mouth two times a day.   * don't fill after 08/30/2007  Other Orders: Rapid Strep (28413)   Patient Instructions: 1)  Get plenty of rest, drink lots of clear liquids, and use tylenol  for fever and comfort. return in 7-10 days if you're not better:sooner if you're feeling worse. 2)  May start Augmentin if no improvement by Saturday. Follow up if you worsen. 3)  Also recommend Mucinex DM    Prescriptions: AUGMENTIN 875-125 MG  TABS (AMOXICILLIN-POT CLAVULANATE) 1 by mouth two times a day.   * Don't fill after 08/30/2007  #20 x 0   Entered and Authorized by:   Leanne Chang MD   Signed by:   Leanne Chang MD on 08/11/2007   Method used:   Print then Give to Patient   RxID:   [id number]  ] Laboratory Results  Date/Time Received: ...................................................................Ardyth Man  August 11, 2007 9:17 AM   Date/Time Reported: ..................................................................[name]  Ardyth Man  August 11, 2007 9:17 AM   Other Tests  Rapid Strep: negative

## 2010-12-15 NOTE — Progress Notes (Signed)
Summary: Nuclear Pre-Procedure  Phone Note Outgoing Call Call back at [facility] Phone [phone]   Call placed by: Stanton Kidney, EMT-P,  January 20, 2010 3:45 PM Call placed to: Patient Action Taken: Phone Call Completed Summary of Call: Reviewed information on [employer] Information Sheet (see scanned document for further details).  Spoke with Patient.    Nuclear Med Background Indications for Stress Test: Evaluation for Ischemia, Surgical Clearance, Abnormal EKG  Indications Comments: Pending Knee Surgery  History: Asthma      Nuclear Pre-Procedure Cardiac Risk Factors: Hypertension, Lipids, RBBB Height (in): 69

## 2010-12-15 NOTE — Assessment & Plan Note (Signed)
Summary: roa 3 months.cbs   Vital Signs:  Patient Profile:   65 Years Old Male Height:     70 inches Weight:      250.8 pounds Pulse rate:   86 / minute BP sitting:   120 / 78  (left arm)  Vitals Entered By: [name] (November 20, 2008 10:06 AM)                 PCP:  Laury Axon  Chief Complaint:  roa and labs .  History of Present Illness: 66 yo man here today for f/u on 1) DM- fasting CBGs 100-115.  following ADA diet- 'i stay away from carbohydrates'.  no exercise.  no CP, SOB, visual changes, HAs, edema.  2) Hyperlipidemia- last labs in 6/09, taking Advicor.  no N/V, no myalgias.  3) Knee pain- pt reports chronic knee pain bilaterally from skiing 'many, many, many years ago'.  takes advil w/ good relief.  increased pain recently w/ cold weather.  doesn't add tylenol.  pt reports pain is in front of knee, underneath knee cap.  intermittant crepitus, no edema.    Prior Medication List:  CYMBALTA 60 MG CPEP (DULOXETINE HCL)  ENALAPRIL MALEATE 2.5 MG TABS (ENALAPRIL MALEATE) TAKE ONE TABLET DAILY CLONAZEPAM 0.5 MG TABS (CLONAZEPAM)  GABAPENTIN 300 MG CAPS (GABAPENTIN)  ADVICOR 500-20 MG TB24 (NIACIN-LOVASTATIN) Take 2 tablet by mouth once a day FEXOFENADINE HCL 180 MG TABS (FEXOFENADINE HCL)  ACTOPLUS MET 15-500 MG TABS (PIOGLITAZONE HCL-METFORMIN HCL) TAKE ONE TABLET TWICE DAILY ADPRIN B 325 MG  TABS (ASPIRIN BUF(CACARB-MGCARB-MGO)) Take one tablet daily MECLIZINE HCL 25 MG  TABS (MECLIZINE HCL) Take one tablet daily as needed for vertigo QVAR 40 MCG/ACT  AERS (BECLOMETHASONE DIPROPIONATE) Use 2 puffs as needed in the morning FLONASE 50 MCG/ACT  SUSP (FLUTICASONE PROPIONATE) Use two sprays in each nostril twice daily as needed. EC-NAPROSYN 500 MG TBEC (NAPROXEN) 1 TAB by mouth two times a day X 10 DAYS AND THEN AS NEEDED FOR HIP PAIN GEODON 80 MG CAPS (ZIPRASIDONE HCL) 2 tabs daily   Current Allergies (reviewed today): ! PERCOCET ! DEPAKOTE ! CELEXA ! PAXIL !  WELLBUTRIN ! * SMZ ! * SYMBIAX  Past Medical History:    Diabetes mellitus, type II    Hyperlipidemia    Hypertension    Bipolar    Allergy shots by Dr Corinda Gubler     Review of Systems  The patient denies anorexia, fever, weight gain, chest pain, syncope, dyspnea on exertion, peripheral edema, prolonged cough, headaches, abdominal pain, and muscle weakness.     Physical Exam  General:     Well-developed,well-nourished,in no acute distress; alert,appropriate and cooperative throughout examination Head:     Normocephalic and atraumatic without obvious abnormalities. Eyes:     No corneal or conjunctival inflammation noted. EOMI. Perrla. Funduscopic exam benign, without hemorrhages, exudates or papilledema. Vision grossly normal. Mouth:     Oral mucosa and oropharynx without lesions or exudates.   Neck:     No deformities, masses, or tenderness noted. Lungs:     Normal respiratory effort, chest expands symmetrically. Lungs are clear to auscultation, no crackles or wheezes. Heart:     normal rate, regular rhythm, and no murmur.   Abdomen:     Bowel sounds positive,abdomen soft and non-tender without masses, organomegaly or hernias noted. Msk:     No crepitus of knees bilaterally full ROM bilaterally mild patellar grind on L, (-) on R no joint line tenderness Pulses:     +  2 carotid, DP, radial Extremities:     No clubbing, cyanosis, edema, or deformity noted with normal full range of motion of all joints.      Impression & Recommendations:  Problem # 1:  KNEE PAIN, CHRONIC (ICD-719.46) Assessment: New Pt w/ mild TTP over L patella.  Pt to take Naprosyn to relieve pain.  ice/heat as needed.  if no improvement will refer to ortho His updated medication list for this problem includes:    Adprin B 325 Mg Tabs (Aspirin buf(cacarb-mgcarb-mgo)) .[name]... Take one tablet daily    Ec-naprosyn 500 Mg Tbec (Naproxen) .[name]... 1 tab by mouth two times a day x 10 days and then as needed  for hip pain   Problem # 2:  DIABETES MELLITUS, TYPE II (ICD-250.00) Assessment: Unchanged fasting CBGs at goal by report- pt did not bring log or meter.  tolerating meds w/out difficulty.  check labs.  will discuss eye exam and perform foot exam at next visit.  stressed importance of exercise.  Pt expresses understanding and is in agreement w/ this plan. His updated medication list for this problem includes:    Enalapril Maleate 2.5 Mg Tabs (Enalapril maleate) .[name]... Take one tablet daily    Actoplus Met 15-500 Mg Tabs (Pioglitazone hcl-metformin hcl) .[name]... Take one tablet twice daily    Adprin B 325 Mg Tabs (Aspirin buf(cacarb-mgcarb-mgo)) .[name]... Take one tablet daily  Orders: Venipuncture (16109) TLB-A1C / Hgb A1C (Glycohemoglobin) (83036-A1C)   Problem # 3:  HYPERLIPIDEMIA (ICD-272.4) Assessment: Unchanged check labs.  tolerating meds. His updated medication list for this problem includes:    Advicor 500-20 Mg Tb24 (Niacin-lovastatin) .[name]... Take 2 tablet by mouth once a day  Orders: TLB-Lipid Panel (80061-LIPID) TLB-BMP (Basic Metabolic Panel-BMET) (80048-METABOL) TLB-Hepatic/Liver Function Pnl (80076-HEPATIC)   Complete Medication List: 1)  Cymbalta 60 Mg Cpep (Duloxetine hcl) 2)  Enalapril Maleate 2.5 Mg Tabs (Enalapril maleate) .... Take one tablet daily 3)  Clonazepam 0.5 Mg Tabs (Clonazepam) 4)  Gabapentin 300 Mg Caps (Gabapentin) 5)  Advicor 500-20 Mg Tb24 (Niacin-lovastatin) .... Take 2 tablet by mouth once a day 6)  Fexofenadine Hcl 180 Mg Tabs (Fexofenadine hcl) 7)  Actoplus Met 15-500 Mg Tabs (Pioglitazone hcl-metformin hcl) .... Take one tablet twice daily 8)  Adprin B 325 Mg Tabs (Aspirin buf(cacarb-mgcarb-mgo)) .... Take one tablet daily 9)  Meclizine Hcl 25 Mg Tabs (Meclizine hcl) .... Take one tablet daily as needed for vertigo 10)  Qvar 40 Mcg/act Aers (Beclomethasone dipropionate) .... Use 2 puffs as needed in the morning 11)  Flonase 50 Mcg/act Susp  (Fluticasone propionate) .... Use two sprays in each nostril twice daily as needed. 12)  Ec-naprosyn 500 Mg Tbec (Naproxen) .[name].. 1 tab by mouth two times a day x 10 days and then as needed for hip pain 13)  Geodon 60 Mg Caps (Ziprasidone hcl) .... Take one tablet two times a day   Patient Instructions: 1)  Please schedule a follow-up appointment in 3 months. 2)  Keep up the good work on diet- increase your amount of exercise 3)  We will notify you of your labs 4)  Stop the Advil and take the Naprosyn- twice a day with food as needed for pain.  Once pain improves you can restart the Advil as needed.  If no improvement- please let me know 5)  Happy New Year!   ]

## 2010-12-15 NOTE — Letter (Signed)
Summary: [facility]  [facility]   Imported By: Lanelle Bal 04/15/2009 13:39:30  _____________________________________________________________________  External Attachment:    Type:   Image     Comment:   External Document

## 2010-12-15 NOTE — Letter (Signed)
Summary: [facility]   Imported By: Lanelle Bal 06/30/2009 09:24:41  _____________________________________________________________________  External Attachment:    Type:   Image     Comment:   External Document

## 2010-12-15 NOTE — Assessment & Plan Note (Signed)
Summary: cpx & lab.cbs   Vital Signs:  Patient profile:   65 year old male Height:      69 inches Weight:      249.8 pounds BMI:     37.02 Pulse rate:   80 / minute Resp:     18 per minute BP sitting:   150 / 80  (left arm)  Vitals Entered By: [name] (February 20, 2009 9:26 AM) CC: cpx and lab    History of Present Illness: 65 yo man here today for CPE.  having a hard time w/ Saphris sedation.  developed sore on penis- has been using neosporin ointment w/out improvement.  tried Lotrimin- not sure if it is helping.  pain w/ retracting foreskin.  developed 3-4 weeks ago.  no hx of similar.  no new sexual partners (no sex per pt's report)  BP elevation- pt reports being nervous, not sure why.  DM- fasting CBGs running 130s, later in the day averaging 110 by pt report.  did not bring meter.  Had colonoscopy done by Dr Corinda Gubler within last 10 yrs.  Allergies: 1)  ! Percocet 2)  ! Depakote 3)  ! Celexa 4)  ! Paxil 5)  ! Wellbutrin 6)  ! * Smz 7)  ! * Symbiax  Review of Systems       The patient complains of genital sores.  The patient denies anorexia, fever, weight loss, weight gain, vision loss, decreased hearing, hoarseness, chest pain, syncope, dyspnea on exertion, peripheral edema, prolonged cough, headaches, abdominal pain, melena, hematochezia, suspicious skin lesions, difficulty walking, abnormal bleeding, enlarged lymph nodes, and testicular masses.    Physical Exam  General:  pt hypersomnolent- difficult time staying awake during exam Head:  Normocephalic and atraumatic without obvious abnormalities. Eyes:  No corneal or conjunctival inflammation noted. EOMI. Perrla. Funduscopic exam benign, without hemorrhages, exudates or papilledema. Vision grossly normal. Ears:  External ear exam shows no significant lesions or deformities.  Otoscopic examination reveals clear canals, tympanic membranes are intact bilaterally without bulging, retraction, inflammation or  discharge. Hearing is grossly normal bilaterally. Nose:  External nasal examination shows no deformity or inflammation. Nasal mucosa are pink and moist without lesions or exudates. Mouth:  Oral mucosa and oropharynx without lesions or exudates.  constant mouth motion- consistent w/ tardive dyskinesia Neck:  No deformities, masses, or tenderness noted. Lungs:  Normal respiratory effort, chest expands symmetrically. Lungs are clear to auscultation, no crackles or wheezes. Heart:  normal rate, regular rhythm, and no murmur.   Abdomen:  Bowel sounds positive,abdomen soft and non-tender without masses, organomegaly or hernias noted. Rectal:  No external abnormalities noted. Normal sphincter tone. No rectal masses or tenderness. Genitalia:  difficulty retracting foreskin, area of redness and irritation on R underside of foreskin- visible when retracted.  no ulcers visible. Prostate:  Prostate gland firm and smooth, no enlargement, nodularity, tenderness, mass, asymmetry or induration. Msk:  No deformity or scoliosis noted of thoracic or lumbar spine.   Pulses:  +2 carotid, DP, radial Extremities:  No clubbing, cyanosis, edema, or deformity noted with normal full range of motion of all joints.   Neurologic:  No cranial nerve deficits noted. Station and gait are normal. Plantar reflexes are down-going bilaterally. DTRs are symmetrical throughout. Sensory, motor and coordinative functions appear intact. Skin:  Intact without suspicious lesions or rashes Cervical Nodes:  No lymphadenopathy noted Inguinal Nodes:  No significant adenopathy Psych:  hypersomnalent, mouth movements consistent w/ tardive dyskinesia.  Diabetes Management Exam:  Foot Exam (with socks and/or shoes not present):       Sensory-Pinprick/Light touch:          Left medial foot (L-4): normal          Left dorsal foot (L-5): normal          Left lateral foot (S-1): normal          Right medial foot (L-4): normal          Right  dorsal foot (L-5): normal          Right lateral foot (S-1): normal       Sensory-Monofilament:          Left foot: normal          Right foot: normal       Inspection:          Left foot: normal          Right foot: normal       Nails:          Left foot: normal          Right foot: normal    Eye Exam:       Eye Exam done elsewhere          Results: normal   Impression & Recommendations:  Problem # 1:  PREVENTIVE HEALTH CARE (ICD-V70.0) Assessment Unchanged pt's PE WNL w/ exception of phimosis and hypersomnolence.  check labs.  anticipatory guidance provided. Orders: Venipuncture (16109) TLB-CBC Platelet - w/Differential (85025-CBCD) TLB-TSH (Thyroid Stimulating Hormone) (84443-TSH) TLB-PSA (Prostate Specific Antigen) (84153-PSA) EKG w/ Interpretation (93000)  Problem # 2:  DIABETES MELLITUS, TYPE II (ICD-250.00) Assessment: Unchanged check labs.  pt reports adequate control. His updated medication list for this problem includes:    Enalapril Maleate 2.5 Mg Tabs (Enalapril maleate) .[name]... Take one tablet daily    Actoplus Met 15-500 Mg Tabs (Pioglitazone hcl-metformin hcl) .[name]... Take one tablet twice daily    Adprin B 325 Mg Tabs (Aspirin buf(cacarb-mgcarb-mgo)) .[name]... Take one tablet daily  Orders: TLB-BMP (Basic Metabolic Panel-BMET) (80048-METABOL) TLB-A1C / Hgb A1C (Glycohemoglobin) (83036-A1C)  Problem # 3:  HYPERLIPIDEMIA (ICD-272.4) Assessment: Unchanged check lipids and LFTs.  make adjustments as needed His updated medication list for this problem includes:    Advicor 500-20 Mg Tb24 (Niacin-lovastatin) .[name]... Take 2 tablet by mouth once a day  Orders: TLB-Hepatic/Liver Function Pnl (80076-HEPATIC) TLB-Lipid Panel (80061-LIPID)  Problem # 4:  PHIMOSIS (ICD-605) Assessment: New pt w/ difficulty retracting foreskin.  now has what appears to be fungal infxn likely due to difficulties w/ personal hygeine.  will refer to uro.  pt to continue Lotrimin on irritated  area.  Pt expresses understanding and is in agreement w/ this plan. Orders: Urology Referral (Urology)  Problem # 5:  BIPOLAR DISORDER UNSPECIFIED (ICD-296.80) Assessment: Unchanged pt's meds recently changed.  more lethargic and somnolent than i have ever seen.  now w/ noticeable tardive dyskinesia sxs.  asked pt to call psych and discuss problems w/ new meds.  concerned b/c pt is driving in this state and is barely able to keep eyes open.  pt gave permission to send copy of today's note along w/ labs.  Complete Medication List: 1)  Cymbalta 60 Mg Cpep (Duloxetine hcl) 2)  Enalapril Maleate 2.5 Mg Tabs (Enalapril maleate) .... Take one tablet daily 3)  Clonazepam 0.5 Mg Tabs (Clonazepam) 4)  Gabapentin 300 Mg Caps (Gabapentin) 5)  Advicor 500-20 Mg Tb24 (Niacin-lovastatin) .... Take 2 tablet by mouth once  a day 6)  Fexofenadine Hcl 180 Mg Tabs (Fexofenadine hcl) 7)  Actoplus Met 15-500 Mg Tabs (Pioglitazone hcl-metformin hcl) .... Take one tablet twice daily 8)  Adprin B 325 Mg Tabs (Aspirin buf(cacarb-mgcarb-mgo)) .... Take one tablet daily 9)  Meclizine Hcl 25 Mg Tabs (Meclizine hcl) .... Take one tablet daily as needed for vertigo 10)  Qvar 40 Mcg/act Aers (Beclomethasone dipropionate) .... Use 2 puffs as needed in the morning 11)  Flonase 50 Mcg/act Susp (Fluticasone propionate) .... Use two sprays in each nostril twice daily as needed. 12)  Ec-naprosyn 500 Mg Tbec (Naproxen) .[name].. 1 tab by mouth two times a day 13)  Saphris 10 Mg Subl (Asenapine maleate) .... Take 1 in am and 1 in pm 14)  Onetouch Ultra Test Strp (Glucose blood) .... Pt test once daily  Patient Instructions: 1)  Please schedule a nurse visit in 1 month to check your blood pressure. 2)  Schedule a follow up with me in 3 months to check Diabetes 3)  Complete the stool cards and mail them back 4)  Someone will call you with your urology appt 5)  Your sore appears to be fungal- continue to use the Lotrimin on the area  two times a day 6)  We will notify you of your lab work 7)  Call Dr Loralie Champagne office and discuss your sleepiness- you may need to decrease your dose.  Avoid driving. 8)  Call with any questions or concerns 9)  Hang in there!

## 2010-12-15 NOTE — Progress Notes (Signed)
Summary: general question- form, should he cancel echo   Phone Note Call from Patient Call back at Home Phone [phone] Call back at Work Phone [phone]   Caller: Patient Reason for Call: Talk to Nurse Complaint: Breathing Problems Details for Reason: Per pt calling, should pt cancel his echo due to stress test normal.  also checking the status of  form to be filled , pre-op approval form.  Initial call taken by: Lorne Skeens,  January 26, 2010 10:19 AM  Follow-up for Phone Call        Per Dr.Alilah Mcmeans....OK to cancell echo...already cleared for surgery... form faxed to Dr[facility]Collins at [facility]...patient aware. Follow-up by: Suzan Garibaldi RN

## 2010-12-15 NOTE — Letter (Signed)
Summary: External Correspondence  External Correspondence   Imported By: Freddy Jaksch 05/24/2007 10:46:43  _____________________________________________________________________  External Attachment:    Type:   Image     Comment:   [facility]

## 2010-12-15 NOTE — Medication Information (Signed)
Summary: Drug Therapy Profile/[employer]  Drug Therapy Profile/[employer]   Imported By: Lanelle Bal 02/04/2010 12:55:14  _____________________________________________________________________  External Attachment:    Type:   Image     Comment:   External Document

## 2010-12-15 NOTE — Assessment & Plan Note (Signed)
Summary: rto 3 months diabetic check/cbs   Vital Signs:  Patient profile:   65 year old male Weight:      243.2 pounds Pulse rate:   68 / minute BP sitting:   112 / 66  (left arm)  Vitals Entered By: [name] (May 22, 2009 9:03 AM) CC: 3 month roa    History of Present Illness: 65 yo man here today for DM f/u.  reports CBGs in the AM are 130 and after meds 110.  eye exam w/ Hazle Quant (thinks it was March but knows it was this spring).  having exams 2x/yr.  no visual changes, n/v, HAs, no slow healing wounds (except L middle finger which he sees hand specialist today).  pt reports neuropathy in feet is not progressing.  Current Medications (verified): 1)  Cymbalta 60 Mg Cpep (Duloxetine Hcl) .... Take One Tablet Daily 2)  Enalapril Maleate 2.5 Mg Tabs (Enalapril Maleate) .... Take One Tablet Daily 3)  Clonazepam 0.5 Mg Tabs (Clonazepam) .... Take One Tablet Qid 4)  Simvastatin 40 Mg Tabs (Simvastatin) .... Take One Tablet At Bedtime 5)  Fexofenadine Hcl 180 Mg Tabs (Fexofenadine Hcl) 6)  Actoplus Met 15-500 Mg Tabs (Pioglitazone Hcl-Metformin Hcl) .... Take One Tablet Twice Daily 7)  Aspirin 81 Mg Tbec (Aspirin) .... Take One Tablet Daily 8)  Meclizine Hcl 25 Mg  Tabs (Meclizine Hcl) .... Take One Tablet Daily As Needed For Vertigo 9)  Qvar 40 Mcg/act  Aers (Beclomethasone Dipropionate) .... Use 2 Puffs As Needed in The Morning 10)  Flonase 50 Mcg/act  Susp (Fluticasone Propionate) .... Use Two Sprays in Each Nostril Twice Daily As Needed. 11)  Ec-Naprosyn 500 Mg Tbec (Naproxen) .[name].. 1 Tab By Mouth Two Times A Day 12)  Saphris 5 Mg Subl (Asenapine Maleate) .[name].. 1 Tablet in Pm 13)  Onetouch Ultra Test  Strp (Glucose Blood) .... Pt Test Once Daily 14)  Lamictal 25 Mg Tabs (Lamotrigine) .... Take One Tablet Bedtime 15)  Doxycycline Hyclate 100 Mg Caps (Doxycycline Hyclate) .... Take 1 Tab Twice A Day  Allergies (verified): 1)  ! Percocet 2)  ! Depakote 3)  ! Celexa 4)  ! Paxil 5)   ! Wellbutrin 6)  ! * Smz 7)  ! Marlon Pel  Past History:  Past Medical History: Last updated: 11/20/2008 Diabetes mellitus, type II Hyperlipidemia Hypertension Bipolar Allergy shots by Dr Corinda Gubler  Review of Systems      See HPI  Physical Exam  General:  awake and alert and well-developed.   Neck:  No deformities, masses, or tenderness noted. Lungs:  Normal respiratory effort, chest expands symmetrically. Lungs are clear to auscultation, no crackles or wheezes. Heart:  normal rate, regular rhythm, and no murmur.   Pulses:  +2 carotid, DP, radial Extremities:  L middle finger w/ old laceration along radial (medial) border of nail w/ presence of pyogenic granuloma.   no c/C/E of LEs  Diabetes Management Exam:    Foot Exam (with socks and/or shoes not present):       Sensory-Pinprick/Light touch:          Left medial foot (L-4): normal          Left dorsal foot (L-5): normal          Left lateral foot (S-1): normal          Right medial foot (L-4): normal          Right dorsal foot (L-5): normal  Right lateral foot (S-1): normal       Sensory-Monofilament:          Left foot: normal          Right foot: normal       Inspection:          Left foot: normal          Right foot: normal       Nails:          Left foot: normal          Right foot: normal   Impression & Recommendations:  Problem # 1:  DIABETES MELLITUS, TYPE II (ICD-250.00) Assessment Unchanged pt's CBGs well controlled by report.  asymptomatic.  UTD on eye exams.  normal monofilament exam.  check A1C and make med changes as needed. His updated medication list for this problem includes:    Enalapril Maleate 2.5 Mg Tabs (Enalapril maleate) .[name]... Take one tablet daily    Actoplus Met 15-500 Mg Tabs (Pioglitazone hcl-metformin hcl) .[name]... Take one tablet twice daily    Aspirin 81 Mg Tbec (Aspirin) .[name]... Take one tablet daily  Orders: Venipuncture ([address]109) TLB-A1C / Hgb A1C (Glycohemoglobin)  (83036-A1C)  Complete Medication List: 1)  Cymbalta 60 Mg Cpep (Duloxetine hcl) .... Take one tablet daily 2)  Enalapril Maleate 2.5 Mg Tabs (Enalapril maleate) .... Take one tablet daily 3)  Clonazepam 0.5 Mg Tabs (Clonazepam) .... Take one tablet qid 4)  Simvastatin 40 Mg Tabs (Simvastatin) .... Take one tablet at bedtime 5)  Fexofenadine Hcl 180 Mg Tabs (Fexofenadine hcl) 6)  Actoplus Met 15-500 Mg Tabs (Pioglitazone hcl-metformin hcl) .... Take one tablet twice daily 7)  Aspirin 81 Mg Tbec (Aspirin) .... Take one tablet daily 8)  Meclizine Hcl 25 Mg Tabs (Meclizine hcl) .... Take one tablet daily as needed for vertigo 9)  Qvar 40 Mcg/act Aers (Beclomethasone dipropionate) .... Use 2 puffs as needed in the morning 10)  Flonase 50 Mcg/act Susp (Fluticasone propionate) .... Use two sprays in each nostril twice daily as needed. 11)  Ec-naprosyn 500 Mg Tbec (Naproxen) .[name].. 1 tab by mouth two times a day 12)  Saphris 5 Mg Subl (Asenapine maleate) .[name].. 1 tablet in pm 13)  Onetouch Ultra Test Strp (Glucose blood) .... Pt test once daily 14)  Lamictal 25 Mg Tabs (Lamotrigine) .... Take one tablet bedtime 15)  Doxycycline Hyclate 100 Mg Caps (Doxycycline hyclate) .... Take 1 tab twice a day  Patient Instructions: 1)  Please schedule a follow-up appointment in 3 months to recheck diabetes. 2)  Keep up the good work on diet and exercise 3)  Call with any questions or concerns 4)  Enjoy the rest of your summer!!

## 2010-12-17 NOTE — Assessment & Plan Note (Signed)
Summary: rto 3 months & lab/cbs   Vital Signs:  Patient profile:   65 year old male Weight:      263 pounds BMI:     37.87 Pulse rate:   90 / minute BP sitting:   124 / 74  (left arm)  Vitals Entered By: [name] CMA (November 27, 2010 8:58 AM) CC: 3 month f/u and labs   History of Present Illness: 65 yo man here today for   1) DM- CBGs 130-150 fasting.  on Actoplusmet.  CBGs slightly higher since having knee surgery.  activity is increasing since having knees done.  plans on starting Silver Sneakers.  no symptomatic lows, CP, SOB, HAs, visual changes, edema.  2) Hyperlipidemia- due for labs today.  tolerating simvastatin w/out difficulty.  no abd pain, N/V, myalgias  3) HTN- excellent control.  asymptomatic.  Current Medications (verified): 1)  Cymbalta 60 Mg Cpep (Duloxetine Hcl) .... Take One Tablet Daily 2)  Enalapril Maleate 2.5 Mg Tabs (Enalapril Maleate) .... Take One Tablet Daily 3)  Clonazepam 0.5 Mg Tabs (Clonazepam) .... Take One Tablet Qid 4)  Simvastatin 40 Mg Tabs (Simvastatin) .... Take One Tablet At Bedtime 5)  Cetirizine Hcl 10 Mg Tabs (Cetirizine Hcl) .... Take One Tablet Daily 6)  Aspirin 81 Mg Tbec (Aspirin) .... Take One Tablet Daily 7)  Meclizine Hcl 25 Mg  Tabs (Meclizine Hcl) .... Take One Tablet Daily As Needed For Vertigo 8)  Symbicort 80-4.5 Mcg/act Aero (Budesonide-Formoterol Fumarate) .... Two Puffs Twice Daily 9)  Flonase 50 Mcg/act  Susp (Fluticasone Propionate) .... Use Two Sprays in Each Nostril Twice Daily As Needed. 10)  Ec-Naprosyn 500 Mg Tbec (Naproxen) .[name].. 1 Tab By Mouth Two Times As Needed 11)  Saphris 5 Mg Subl (Asenapine Maleate) .[name].. 1 Tablet in Pm 12)  Onetouch Ultra Test  Strp (Glucose Blood) .... Pt Test Once Daily 13)  Lamictal Xr 50 Mg Xr24h-Tab (Lamotrigine) .[name].. 1 By Mouth Daily 14)  Ambien Cr 12.5 Mg Cr-Tabs (Zolpidem Tartrate) .[name].. 1 By Mouth As Needed 15)  Trazodone Hcl 50 Mg Tabs (Trazodone Hcl) .... 3 By Mouth As  Needed 16)  Cogentin 2mg  .... 1 By Mouth Daily 17)  Gabapentin 300 Mg Caps (Gabapentin) .[name].. 1 Qam and 2 Qpm 18)  [facility] Spectravite Senior  Tabs (Multiple Vitamins-Minerals) .[name].. 1 By Mouth Daily 19)  Actoplus Met 15-850 Mg Tabs (Pioglitazone Hcl-Metformin Hcl) .... Take One Tablet Two Times A Day 20)  Tessalon 200 Mg Caps (Benzonatate) .... Take One Capsule By Mouth Three Times A Day As Needed For Cough  Allergies (verified): 1)  ! Percocet 2)  ! Depakote 3)  ! Celexa 4)  ! Paxil 5)  ! Wellbutrin 6)  ! * Smz 7)  ! * Symbiax 8)  ! * Risperidone  Past History:  Past medical, surgical, family and social histories (including risk factors) reviewed, and no changes noted (except as noted below).  Past Medical History: Reviewed history from 05/27/2010 and no changes required. Diabetes mellitus, type II Hyperlipidemia Hypertension Bipolar Allergy shots by Dr Corinda Gubler DJD Obesity Asthma GERD Meniere's disease Normal stress test 3/11 Holen Horst Plaque in eye  Past Surgical History: Reviewed history from 05/27/2010 and no changes required. Cholecystectomy Knee surgery- Dr Charlann Boxer  Family History: Reviewed history from 01/10/2008 and no changes required. DM Breast Cancer  Social History: Reviewed history from 01/15/2010 and no changes required. Occupation: disability Married Never Smoked Alcohol use-no Drug Use - no  Review of Systems  See HPI  Physical Exam  General:  Overweight white male in NAD Head:  Normocephalic and atraumatic without obvious abnormalities. Neck:  No deformities, masses, or tenderness noted. Lungs:  Normal respiratory effort, chest expands symmetrically. Lungs are clear to auscultation, no crackles or wheezes. Heart:  Normal rate and regular rhythm. S1 and S2 normal without gallop, murmur, click, rub or other extra sounds. Abdomen:  Bowel sounds positive,abdomen soft and non-tender without masses, organomegaly.  ventral hernia noted when pt  sits up Pulses:  +2 carotid, radial, DP Extremities:  no edema of LEs bilaterally   Impression & Recommendations:  Problem # 1:  DIABETES MELLITUS, TYPE II (ICD-250.00) Assessment Unchanged check A1C.  may need to adjust meds as CBGs are running higher than previous.  pt plans to start exercising. His updated medication list for this problem includes:    Enalapril Maleate 2.5 Mg Tabs (Enalapril maleate) .[name]... Take one tablet daily    Aspirin 81 Mg Tbec (Aspirin) .[name]... Take one tablet daily    Actoplus Met 15-850 Mg Tabs (Pioglitazone hcl-metformin hcl) .[name]... Take one tablet two times a day  Orders: Venipuncture (16109) TLB-A1C / Hgb A1C (Glycohemoglobin) (83036-A1C) TLB-BMP (Basic Metabolic Panel-BMET) (80048-METABOL) Specimen Handling (60454)  Problem # 2:  HYPERLIPIDEMIA (ICD-272.4) Assessment: Unchanged due for labs, adjust meds as needed. His updated medication list for this problem includes:    Simvastatin 40 Mg Tabs (Simvastatin) .[name]... Take one tablet at bedtime  Orders: TLB-Lipid Panel (80061-LIPID) TLB-Hepatic/Liver Function Pnl (80076-HEPATIC) Specimen Handling (09811)  Problem # 3:  ESSENTIAL HYPERTENSION, BENIGN (ICD-401.1) Assessment: Unchanged excellent control.  no need for med changes at this time. His updated medication list for this problem includes:    Enalapril Maleate 2.5 Mg Tabs (Enalapril maleate) .[name]... Take one tablet daily  Complete Medication List: 1)  Cymbalta 60 Mg Cpep (Duloxetine hcl) .... Take one tablet daily 2)  Enalapril Maleate 2.5 Mg Tabs (Enalapril maleate) .... Take one tablet daily 3)  Clonazepam 0.5 Mg Tabs (Clonazepam) .... Take one tablet qid 4)  Simvastatin 40 Mg Tabs (Simvastatin) .... Take one tablet at bedtime 5)  Cetirizine Hcl 10 Mg Tabs (Cetirizine hcl) .... Take one tablet daily 6)  Aspirin 81 Mg Tbec (Aspirin) .... Take one tablet daily 7)  Meclizine Hcl 25 Mg Tabs (Meclizine hcl) .... Take one tablet daily as needed for  vertigo 8)  Symbicort 80-4.5 Mcg/act Aero (Budesonide-formoterol fumarate) .... Two puffs twice daily 9)  Flonase 50 Mcg/act Susp (Fluticasone propionate) .... Use two sprays in each nostril twice daily as needed. 10)  Ec-naprosyn 500 Mg Tbec (Naproxen) .[name].. 1 tab by mouth two times as needed 11)  Saphris 5 Mg Subl (Asenapine maleate) .[name].. 1 tablet in pm 12)  Onetouch Ultra Test Strp (Glucose blood) .... Pt test once daily 13)  Lamictal Xr 50 Mg Xr24h-tab (Lamotrigine) .[name].. 1 by mouth daily 14)  Ambien Cr 12.5 Mg Cr-tabs (Zolpidem tartrate) .[name].. 1 by mouth as needed 15)  Trazodone Hcl 50 Mg Tabs (Trazodone hcl) .... 3 by mouth as needed 16)  Cogentin 2mg   .... 1 by mouth daily 17)  Gabapentin 300 Mg Caps (Gabapentin) .[name].. 1 qam and 2 qpm 18)  [facility] Spect[facility] Senior Tabs (Multiple vitamins-minerals) .[name].. 1 by mouth daily 19)  Actoplus Met 15-850 Mg Tabs (Pioglitazone hcl-metformin hcl) .... Take one tablet two times a day 20)  Tessalon 200 Mg Caps (Benzonatate) .... Take one capsule by mouth three times a day as needed for cough  Patient  Instructions: 1)  Please schedule a follow-up appointment in 3 months. 2)  We'll notify you of your lab results and adjust meds as needed 3)  I'm proud of your decision to start Silver Sneakers 4)  Call with any questions or concerns 5)  Happy New Year!  Prescriptions: EC-NAPROSYN 500 MG TBEC (NAPROXEN) 1 TAB by mouth two times as needed  #60 x 3   Entered and Authorized by:   Neena Rhymes MD   Signed by:   Neena Rhymes MD on 11/27/2010   Method used:   [facility] to        [facility] [phone]* (retail)       [address]       [place], [place]  [address]       Ph: [phone]       Fax: [phone]   RxID:   [id number] SIMVASTATIN 40 MG TABS (SIMVASTATIN) take one tablet at bedtime  #90 x 3   Entered and Authorized by:   Neena Rhymes MD   Signed by:   Neena Rhymes MD on 11/27/2010   Method  used:   [facility] to        [facility] [phone]* (retail)       [address]       [place], [place]  [address]       Ph: [phone]       Fax: [phone]   RxID:   [id number] ENALAPRIL MALEATE 2.5 MG TABS (ENALAPRIL MALEATE) TAKE ONE TABLET DAILY  #90 x 3   Entered and Authorized by:   Neena Rhymes MD   Signed by:   Neena Rhymes MD on 11/27/2010   Method used:   [facility] to        [facility] [phone]* (retail)       [address]       [place], [place]  [address]       Ph: [phone]       Fax: [phone]   RxID:   [id number]    Orders Added: 1)  Venipuncture [34196] 2)  TLB-A1C / Hgb A1C (Glycohemoglobin) [83036-A1C] 3)  TLB-Lipid Panel [80061-LIPID] 4)  TLB-Hepatic/Liver Function Pnl [80076-HEPATIC] 5)  TLB-BMP (Basic Metabolic Panel-BMET) [80048-METABOL] 6)  Specimen Handling [99000] 7)  Est. Patient Level IV [22297]

## 2010-12-24 ENCOUNTER — Encounter: Payer: Self-pay | Admitting: Family Medicine

## 2010-12-24 ENCOUNTER — Ambulatory Visit (INDEPENDENT_AMBULATORY_CARE_PROVIDER_SITE_OTHER): Payer: MEDICARE | Admitting: Family Medicine

## 2010-12-24 DIAGNOSIS — K5289 Other specified noninfective gastroenteritis and colitis: Secondary | ICD-10-CM

## 2010-12-31 NOTE — Assessment & Plan Note (Signed)
Summary: nausea, headache, throwing up///sph   Vital Signs:  Patient profile:   65 year old male Weight:      259 pounds BMI:     37.30 Temp:     98.5 degrees F oral BP sitting:   124 / 82  (right arm)  Vitals Entered By: [name] CMA (December 24, 2010 4:03 PM) CC: NVD xyest along w/ HA hasn't been able to eat or drink much    History of Present Illness: 65 yo man here today w/ N/V/D.  sxs started w/ 'head cold'.  started Mucinex D w/ some relief.  yesterday developed vomiting.  still has nausea today but no vomiting since yesterday.  having diarrhea- no blood.  stools are watery.  having some dizziness.  'terrible headache'.  able to hold down fluids.  + sick contacts.  Current Medications (verified): 1)  Cymbalta 60 Mg Cpep (Duloxetine Hcl) .... Take One Tablet Daily 2)  Enalapril Maleate 2.5 Mg Tabs (Enalapril Maleate) .... Take One Tablet Daily 3)  Clonazepam 0.5 Mg Tabs (Clonazepam) .... Take One Tablet Qid 4)  Simvastatin 40 Mg Tabs (Simvastatin) .... Take One Tablet At Bedtime 5)  Cetirizine Hcl 10 Mg Tabs (Cetirizine Hcl) .... Take One Tablet Daily 6)  Aspirin 81 Mg Tbec (Aspirin) .... Take One Tablet Daily 7)  Meclizine Hcl 25 Mg  Tabs (Meclizine Hcl) .... Take One Tablet Daily As Needed For Vertigo 8)  Symbicort 80-4.5 Mcg/act Aero (Budesonide-Formoterol Fumarate) .... Two Puffs Twice Daily 9)  Flonase 50 Mcg/act  Susp (Fluticasone Propionate) .... Use Two Sprays in Each Nostril Twice Daily As Needed. 10)  Ec-Naprosyn 500 Mg Tbec (Naproxen) .[name].. 1 Tab By Mouth Two Times As Needed 11)  Saphris 5 Mg Subl (Asenapine Maleate) .[name].. 1 Tablet in Pm 12)  Onetouch Ultra Test  Strp (Glucose Blood) .... Pt Test Once Daily 13)  Lamictal Xr 50 Mg Xr24h-Tab (Lamotrigine) .[name].. 1 By Mouth Daily 14)  Ambien Cr 12.5 Mg Cr-Tabs (Zolpidem Tartrate) .[name].. 1 By Mouth As Needed 15)  Trazodone Hcl 50 Mg Tabs (Trazodone Hcl) .... 3 By Mouth As Needed 16)  Cogentin 2mg  .... 1 By Mouth  Daily 17)  Gabapentin 300 Mg Caps (Gabapentin) .[name].. 1 Qam and 2 Qpm 18)  [facility] Senior  Tabs (Multiple Vitamins-Minerals) .[name].. 1 By Mouth Daily 19)  Actoplus Met 15-850 Mg Tabs (Pioglitazone Hcl-Metformin Hcl) .... Take One Tablet Two Times A Day 20)  Zofran 4 Mg Tabs (Ondansetron Hcl) .[name].. 1 By Mouth Every 8 Hours As Needed For Nausea  Allergies (verified): 1)  ! Percocet 2)  ! Depakote 3)  ! Celexa 4)  ! Paxil 5)  ! Wellbutrin 6)  ! * Smz 7)  ! * Symbiax 8)  ! * Risperidone  Review of Systems      See HPI  Physical Exam  General:  [name] in NAD Head:  Normocephalic and atraumatic without obvious abnormalities.  no TTP over sinuses Eyes:  no injxn or inflammation Ears:  External ear exam shows no significant lesions or deformities.  Otoscopic examination reveals clear canals, tympanic membranes are intact bilaterally without bulging, retraction, inflammation or discharge. Hearing is grossly normal bilaterally. Nose:  + congestion Mouth:  Oral mucosa and oropharynx without lesions or exudates.  involuntary oralmotor movements Neck:  No deformities, masses, or tenderness noted. Lungs:  Normal respiratory effort, chest expands symmetrically. Lungs are clear to auscultation, no crackles or wheezes. Heart:  Normal rate and regular  rhythm. S1 and S2 normal without gallop, murmur, click, rub or other extra sounds. Abdomen:  soft, NT/ND, +BS Pulses:  +2 carotid, radial, DP Neurologic:  No cranial nerve deficits noted. Station and gait are normal. Plantar reflexes are down-going bilaterally. DTRs are symmetrical throughout.    Impression & Recommendations:  Problem # 1:  GASTROENTERITIS (ICD-558.9) Assessment New pt's sxs consistent w/ viral gastroenteritis.  start zofran as needed for nausea.  immodium as needed for diarrhea.  reviewed supportive care and red flags that should prompt return.  Pt expresses understanding and is in agreement w/ this plan. His  updated medication list for this problem includes:    Meclizine Hcl 25 Mg Tabs (Meclizine hcl) .[name]... Take one tablet daily as needed for vertigo    Zofran 4 Mg Tabs (Ondansetron hcl) .[name]... 1 by mouth every 8 hours as needed for nausea  Complete Medication List: 1)  Cymbalta 60 Mg Cpep (Duloxetine hcl) .... Take one tablet daily 2)  Enalapril Maleate 2.5 Mg Tabs (Enalapril maleate) .... Take one tablet daily 3)  Clonazepam 0.5 Mg Tabs (Clonazepam) .... Take one tablet qid 4)  Simvastatin 40 Mg Tabs (Simvastatin) .... Take one tablet at bedtime 5)  Cetirizine Hcl 10 Mg Tabs (Cetirizine hcl) .... Take one tablet daily 6)  Aspirin 81 Mg Tbec (Aspirin) .... Take one tablet daily 7)  Meclizine Hcl 25 Mg Tabs (Meclizine hcl) .... Take one tablet daily as needed for vertigo 8)  Symbicort 80-4.5 Mcg/act Aero (Budesonide-formoterol fumarate) .... Two puffs twice daily 9)  Flonase 50 Mcg/act Susp (Fluticasone propionate) .... Use two sprays in each nostril twice daily as needed. 10)  Ec-naprosyn 500 Mg Tbec (Naproxen) .[name].. 1 tab by mouth two times as needed 11)  Saphris 5 Mg Subl (Asenapine maleate) .[name].. 1 tablet in pm 12)  Onetouch Ultra Test Strp (Glucose blood) .... Pt test once daily 13)  Lamictal Xr 50 Mg Xr24h-tab (Lamotrigine) .[name].. 1 by mouth daily 14)  Ambien Cr 12.5 Mg Cr-tabs (Zolpidem tartrate) .[name].. 1 by mouth as needed 15)  Trazodone Hcl 50 Mg Tabs (Trazodone hcl) .... 3 by mouth as needed 16)  Cogentin 2mg   .... 1 by mouth daily 17)  Gabapentin 300 Mg Caps (Gabapentin) .[name].. 1 qam and 2 qpm 18)  [facility] Senior Tabs (Multiple vitamins-minerals) .[name].. 1 by mouth daily 19)  Actoplus Met 15-850 Mg Tabs (Pioglitazone hcl-metformin hcl) .... Take one tablet two times a day 20)  Zofran 4 Mg Tabs (Ondansetron hcl) .[name].. 1 by mouth every 8 hours as needed for nausea  Patient Instructions: 1)  This appears to be a viral illness and should improve w/ time 2)  Use the Zofran as needed for  nausea 3)  Drink plenty of fluids 4)  You'll eat when you are ready- start w/ bland things like crackers and toast 5)  Immodium as needed for diarrhea 6)  Tylenol or Ibuprofen as needed for headache 7)  If you have worsening headache, abdominal pain, vomiting or other concerns- please call or go to the ER 8)  Hang in there!!! Prescriptions: ZOFRAN 4 MG TABS (ONDANSETRON HCL) 1 by mouth every 8 hours as needed for nausea  #20 x 0   Entered and Authorized by:   Neena Rhymes MD   Signed by:   Neena Rhymes MD on 12/24/2010   Method used:   Print then Give to Patient   RxID:   [id number] ZOFRAN 4 MG TABS (ONDANSETRON HCL) 1 by mouth every  8 hours as needed for nausea  #20 x 0   Entered and Authorized by:   Neena Rhymes MD   Signed by:   Neena Rhymes MD on 12/24/2010   Method used:   [facility] to        [facility] [phone]* (retail)       [address]       [place], [place]  [address]       Ph: [phone]       Fax: [phone]   RxID:   [id number]    Orders Added: 1)  Est. Patient Level III [27253]

## 2011-01-23 ENCOUNTER — Encounter: Payer: Self-pay | Admitting: Family Medicine

## 2011-01-26 LAB — GLUCOSE, CAPILLARY
Glucose-Capillary: 115 mg/dL — ABNORMAL HIGH (ref 70–99)
Glucose-Capillary: 135 mg/dL — ABNORMAL HIGH (ref 70–99)

## 2011-01-29 ENCOUNTER — Encounter: Payer: Self-pay | Admitting: Family Medicine

## 2011-01-29 ENCOUNTER — Ambulatory Visit (INDEPENDENT_AMBULATORY_CARE_PROVIDER_SITE_OTHER): Payer: MEDICARE | Admitting: Family Medicine

## 2011-01-29 DIAGNOSIS — M7020 Olecranon bursitis, unspecified elbow: Secondary | ICD-10-CM | POA: Insufficient documentation

## 2011-01-30 LAB — CBC
HCT: 30.2 % — ABNORMAL LOW (ref 39.0–52.0)
HCT: 33.6 % — ABNORMAL LOW (ref 39.0–52.0)
HCT: 39.1 % (ref 39.0–52.0)
Hemoglobin: 10.3 g/dL — ABNORMAL LOW (ref 13.0–17.0)
Hemoglobin: 11.3 g/dL — ABNORMAL LOW (ref 13.0–17.0)
Hemoglobin: 12.9 g/dL — ABNORMAL LOW (ref 13.0–17.0)
MCH: 27 pg (ref 26.0–34.0)
MCH: 27.3 pg (ref 26.0–34.0)
MCH: 27.7 pg (ref 26.0–34.0)
MCHC: 33.1 g/dL (ref 30.0–36.0)
MCHC: 33.6 g/dL (ref 30.0–36.0)
MCHC: 34 g/dL (ref 30.0–36.0)
MCV: 81.1 fL (ref 78.0–100.0)
MCV: 81.5 fL (ref 78.0–100.0)
MCV: 81.8 fL (ref 78.0–100.0)
Platelets: 190 10*3/uL (ref 150–400)
Platelets: 206 10*3/uL (ref 150–400)
Platelets: 263 10*3/uL (ref 150–400)
RBC: 3.7 MIL/uL — ABNORMAL LOW (ref 4.22–5.81)
RBC: 4.15 MIL/uL — ABNORMAL LOW (ref 4.22–5.81)
RBC: 4.78 MIL/uL (ref 4.22–5.81)
RDW: 14.4 % (ref 11.5–15.5)
RDW: 14.6 % (ref 11.5–15.5)
RDW: 14.8 % (ref 11.5–15.5)
WBC: 8.4 10*3/uL (ref 4.0–10.5)
WBC: 8.9 10*3/uL (ref 4.0–10.5)
WBC: 9.1 10*3/uL (ref 4.0–10.5)

## 2011-01-30 LAB — PROTIME-INR
INR: 1.09 (ref 0.00–1.49)
Prothrombin Time: 14 seconds (ref 11.6–15.2)

## 2011-01-30 LAB — DIFFERENTIAL
Basophils Absolute: 0 10*3/uL (ref 0.0–0.1)
Basophils Relative: 0 % (ref 0–1)
Eosinophils Absolute: 0.1 10*3/uL (ref 0.0–0.7)
Eosinophils Relative: 2 % (ref 0–5)
Lymphocytes Relative: 24 % (ref 12–46)
Lymphs Abs: 2.1 10*3/uL (ref 0.7–4.0)
Monocytes Absolute: 0.7 10*3/uL (ref 0.1–1.0)
Monocytes Relative: 7 % (ref 3–12)
Neutro Abs: 5.9 10*3/uL (ref 1.7–7.7)
Neutrophils Relative %: 67 % (ref 43–77)

## 2011-01-30 LAB — GLUCOSE, CAPILLARY
Glucose-Capillary: 103 mg/dL — ABNORMAL HIGH (ref 70–99)
Glucose-Capillary: 107 mg/dL — ABNORMAL HIGH (ref 70–99)
Glucose-Capillary: 109 mg/dL — ABNORMAL HIGH (ref 70–99)
Glucose-Capillary: 122 mg/dL — ABNORMAL HIGH (ref 70–99)
Glucose-Capillary: 128 mg/dL — ABNORMAL HIGH (ref 70–99)
Glucose-Capillary: 136 mg/dL — ABNORMAL HIGH (ref 70–99)
Glucose-Capillary: 137 mg/dL — ABNORMAL HIGH (ref 70–99)
Glucose-Capillary: 142 mg/dL — ABNORMAL HIGH (ref 70–99)
Glucose-Capillary: 143 mg/dL — ABNORMAL HIGH (ref 70–99)
Glucose-Capillary: 146 mg/dL — ABNORMAL HIGH (ref 70–99)
Glucose-Capillary: 148 mg/dL — ABNORMAL HIGH (ref 70–99)
Glucose-Capillary: 148 mg/dL — ABNORMAL HIGH (ref 70–99)
Glucose-Capillary: 85 mg/dL (ref 70–99)

## 2011-01-30 LAB — URINALYSIS, ROUTINE W REFLEX MICROSCOPIC
Bilirubin Urine: NEGATIVE
Glucose, UA: NEGATIVE mg/dL
Hgb urine dipstick: NEGATIVE
Ketones, ur: NEGATIVE mg/dL
Nitrite: NEGATIVE
Protein, ur: NEGATIVE mg/dL
Specific Gravity, Urine: 1.017 (ref 1.005–1.030)
Urobilinogen, UA: 0.2 mg/dL (ref 0.0–1.0)
pH: 6.5 (ref 5.0–8.0)

## 2011-01-30 LAB — BASIC METABOLIC PANEL
BUN: 10 mg/dL (ref 6–23)
BUN: 15 mg/dL (ref 6–23)
BUN: 16 mg/dL (ref 6–23)
CO2: 22 mEq/L (ref 19–32)
CO2: 26 mEq/L (ref 19–32)
CO2: 29 mEq/L (ref 19–32)
Calcium: 8.4 mg/dL (ref 8.4–10.5)
Calcium: 8.5 mg/dL (ref 8.4–10.5)
Calcium: 9.3 mg/dL (ref 8.4–10.5)
Chloride: 102 mEq/L (ref 96–112)
Chloride: 104 mEq/L (ref 96–112)
Chloride: 105 mEq/L (ref 96–112)
Creatinine, Ser: 0.94 mg/dL (ref 0.4–1.5)
Creatinine, Ser: 0.98 mg/dL (ref 0.4–1.5)
Creatinine, Ser: 1.04 mg/dL (ref 0.4–1.5)
GFR calc Af Amer: >60 mL/min (ref >60)
GFR calc Af Amer: >60 mL/min (ref >60)
GFR calc Af Amer: >60 mL/min (ref >60)
GFR calc non Af Amer: >60 mL/min (ref >60)
GFR calc non Af Amer: >60 mL/min (ref >60)
GFR calc non Af Amer: >60 mL/min (ref >60)
Glucose, Bld: 139 mg/dL — ABNORMAL HIGH (ref 70–99)
Glucose, Bld: 147 mg/dL — ABNORMAL HIGH (ref 70–99)
Glucose, Bld: 178 mg/dL — ABNORMAL HIGH (ref 70–99)
Potassium: 4.1 mEq/L (ref 3.5–5.1)
Potassium: 4.2 mEq/L (ref 3.5–5.1)
Potassium: 4.3 mEq/L (ref 3.5–5.1)
Sodium: 135 mEq/L (ref 135–145)
Sodium: 138 mEq/L (ref 135–145)
Sodium: 140 mEq/L (ref 135–145)

## 2011-01-30 LAB — SURGICAL PCR SCREEN
MRSA, PCR: NEGATIVE
Staphylococcus aureus: POSITIVE — AB

## 2011-01-30 LAB — TYPE AND SCREEN
ABO/RH(D): A POS
Antibody Screen: NEGATIVE

## 2011-01-30 LAB — URINE MICROSCOPIC-ADD ON

## 2011-01-30 LAB — APTT: aPTT: 28 seconds (ref 24–37)

## 2011-02-02 LAB — CBC
HCT: 31.9 % — ABNORMAL LOW (ref 39.0–52.0)
HCT: 32.4 % — ABNORMAL LOW (ref 39.0–52.0)
HCT: 33.8 % — ABNORMAL LOW (ref 39.0–52.0)
HCT: 39.5 % (ref 39.0–52.0)
Hemoglobin: 10.6 g/dL — ABNORMAL LOW (ref 13.0–17.0)
Hemoglobin: 10.8 g/dL — ABNORMAL LOW (ref 13.0–17.0)
Hemoglobin: 11.4 g/dL — ABNORMAL LOW (ref 13.0–17.0)
Hemoglobin: 13.1 g/dL (ref 13.0–17.0)
MCHC: 32.9 g/dL (ref 30.0–36.0)
MCHC: 33.1 g/dL (ref 30.0–36.0)
MCHC: 33.7 g/dL (ref 30.0–36.0)
MCHC: 33.8 g/dL (ref 30.0–36.0)
MCV: 84.5 fL (ref 78.0–100.0)
MCV: 84.7 fL (ref 78.0–100.0)
MCV: 84.8 fL (ref 78.0–100.0)
MCV: 85 fL (ref 78.0–100.0)
Platelets: 209 10*3/uL (ref 150–400)
Platelets: 216 10*3/uL (ref 150–400)
Platelets: 233 10*3/uL (ref 150–400)
Platelets: 440 10*3/uL — ABNORMAL HIGH (ref 150–400)
RBC: 3.75 MIL/uL — ABNORMAL LOW (ref 4.22–5.81)
RBC: 3.83 MIL/uL — ABNORMAL LOW (ref 4.22–5.81)
RBC: 3.98 MIL/uL — ABNORMAL LOW (ref 4.22–5.81)
RBC: 4.68 MIL/uL (ref 4.22–5.81)
RDW: 14.8 % (ref 11.5–15.5)
RDW: 14.9 % (ref 11.5–15.5)
RDW: 15.1 % (ref 11.5–15.5)
RDW: 15.3 % (ref 11.5–15.5)
WBC: 6.4 10*3/uL (ref 4.0–10.5)
WBC: 8.2 10*3/uL (ref 4.0–10.5)
WBC: 9.3 10*3/uL (ref 4.0–10.5)
WBC: 9.6 10*3/uL (ref 4.0–10.5)

## 2011-02-02 LAB — BASIC METABOLIC PANEL
BUN: 10 mg/dL (ref 6–23)
BUN: 13 mg/dL (ref 6–23)
BUN: 18 mg/dL (ref 6–23)
BUN: 18 mg/dL (ref 6–23)
CO2: 25 mEq/L (ref 19–32)
CO2: 25 mEq/L (ref 19–32)
CO2: 27 mEq/L (ref 19–32)
CO2: 28 mEq/L (ref 19–32)
Calcium: 8.2 mg/dL — ABNORMAL LOW (ref 8.4–10.5)
Calcium: 8.2 mg/dL — ABNORMAL LOW (ref 8.4–10.5)
Calcium: 8.5 mg/dL (ref 8.4–10.5)
Calcium: 9.1 mg/dL (ref 8.4–10.5)
Chloride: 104 mEq/L (ref 96–112)
Chloride: 104 mEq/L (ref 96–112)
Chloride: 107 mEq/L (ref 96–112)
Chloride: 99 mEq/L (ref 96–112)
Creatinine, Ser: 0.8 mg/dL (ref 0.4–1.5)
Creatinine, Ser: 0.83 mg/dL (ref 0.4–1.5)
Creatinine, Ser: 0.84 mg/dL (ref 0.4–1.5)
Creatinine, Ser: 0.86 mg/dL (ref 0.4–1.5)
GFR calc Af Amer: >60 mL/min (ref >60)
GFR calc Af Amer: >60 mL/min (ref >60)
GFR calc Af Amer: >60 mL/min (ref >60)
GFR calc Af Amer: >60 mL/min (ref >60)
GFR calc non Af Amer: >60 mL/min (ref >60)
GFR calc non Af Amer: >60 mL/min (ref >60)
GFR calc non Af Amer: >60 mL/min (ref >60)
GFR calc non Af Amer: >60 mL/min (ref >60)
Glucose, Bld: 152 mg/dL — ABNORMAL HIGH (ref 70–99)
Glucose, Bld: 178 mg/dL — ABNORMAL HIGH (ref 70–99)
Glucose, Bld: 181 mg/dL — ABNORMAL HIGH (ref 70–99)
Glucose, Bld: 262 mg/dL — ABNORMAL HIGH (ref 70–99)
Potassium: 3.6 mEq/L (ref 3.5–5.1)
Potassium: 4.3 mEq/L (ref 3.5–5.1)
Potassium: 4.3 mEq/L (ref 3.5–5.1)
Potassium: 4.4 mEq/L (ref 3.5–5.1)
Sodium: 133 mEq/L — ABNORMAL LOW (ref 135–145)
Sodium: 137 mEq/L (ref 135–145)
Sodium: 137 mEq/L (ref 135–145)
Sodium: 139 mEq/L (ref 135–145)

## 2011-02-02 LAB — GLUCOSE, CAPILLARY
Glucose-Capillary: 133 mg/dL — ABNORMAL HIGH (ref 70–99)
Glucose-Capillary: 150 mg/dL — ABNORMAL HIGH (ref 70–99)
Glucose-Capillary: 161 mg/dL — ABNORMAL HIGH (ref 70–99)
Glucose-Capillary: 164 mg/dL — ABNORMAL HIGH (ref 70–99)
Glucose-Capillary: 169 mg/dL — ABNORMAL HIGH (ref 70–99)
Glucose-Capillary: 170 mg/dL — ABNORMAL HIGH (ref 70–99)
Glucose-Capillary: 174 mg/dL — ABNORMAL HIGH (ref 70–99)
Glucose-Capillary: 189 mg/dL — ABNORMAL HIGH (ref 70–99)
Glucose-Capillary: 202 mg/dL — ABNORMAL HIGH (ref 70–99)
Glucose-Capillary: 92 mg/dL (ref 70–99)

## 2011-02-02 LAB — DIFFERENTIAL
Basophils Absolute: 0 10*3/uL (ref 0.0–0.1)
Basophils Absolute: 0 10*3/uL (ref 0.0–0.1)
Basophils Relative: 0 % (ref 0–1)
Basophils Relative: 0 % (ref 0–1)
Eosinophils Absolute: 0.1 10*3/uL (ref 0.0–0.7)
Eosinophils Absolute: 0.2 10*3/uL (ref 0.0–0.7)
Eosinophils Relative: 1 % (ref 0–5)
Eosinophils Relative: 4 % (ref 0–5)
Lymphocytes Relative: 26 % (ref 12–46)
Lymphocytes Relative: 32 % (ref 12–46)
Lymphs Abs: 2.1 10*3/uL (ref 0.7–4.0)
Lymphs Abs: 2.1 10*3/uL (ref 0.7–4.0)
Monocytes Absolute: 0.4 10*3/uL (ref 0.1–1.0)
Monocytes Absolute: 0.7 10*3/uL (ref 0.1–1.0)
Monocytes Relative: 7 % (ref 3–12)
Monocytes Relative: 9 % (ref 3–12)
Neutro Abs: 3.7 10*3/uL (ref 1.7–7.7)
Neutro Abs: 5.2 10*3/uL (ref 1.7–7.7)
Neutrophils Relative %: 57 % (ref 43–77)
Neutrophils Relative %: 64 % (ref 43–77)

## 2011-02-02 LAB — URINALYSIS, ROUTINE W REFLEX MICROSCOPIC
Bilirubin Urine: NEGATIVE
Glucose, UA: >1000 mg/dL — AB
Glucose, UA: NEGATIVE mg/dL
Hgb urine dipstick: NEGATIVE
Ketones, ur: >80 mg/dL — AB
Ketones, ur: NEGATIVE mg/dL
Leukocytes, UA: NEGATIVE
Nitrite: NEGATIVE
Nitrite: NEGATIVE
Protein, ur: 30 mg/dL — AB
Protein, ur: NEGATIVE mg/dL
Specific Gravity, Urine: 1.019 (ref 1.005–1.030)
Specific Gravity, Urine: 1.035 — ABNORMAL HIGH (ref 1.005–1.030)
Urobilinogen, UA: 1 mg/dL (ref 0.0–1.0)
Urobilinogen, UA: 1 mg/dL (ref 0.0–1.0)
pH: 6 (ref 5.0–8.0)
pH: 6 (ref 5.0–8.0)

## 2011-02-02 LAB — URINE MICROSCOPIC-ADD ON

## 2011-02-02 LAB — APTT: aPTT: 32 seconds (ref 24–37)

## 2011-02-02 LAB — PROTIME-INR
INR: 1.02 (ref 0.00–1.49)
Prothrombin Time: 13.3 seconds (ref 11.6–15.2)

## 2011-02-02 LAB — TYPE AND SCREEN
ABO/RH(D): A POS
Antibody Screen: NEGATIVE

## 2011-02-02 LAB — ABO/RH: ABO/RH(D): A POS

## 2011-02-02 LAB — PHOSPHORUS: Phosphorus: 1.8 mg/dL — ABNORMAL LOW (ref 2.3–4.6)

## 2011-02-02 LAB — MAGNESIUM: Magnesium: 1.8 mg/dL (ref 1.5–2.5)

## 2011-02-02 NOTE — Assessment & Plan Note (Signed)
Summary: injured rt elbow few weeks ago, still sore///sph   Vital Signs:  Patient profile:   65 year old male Height:      70 inches (177.80 cm) Weight:      260.13 pounds (118.24 kg) BMI:     37.46 Temp:     98.8 degrees F (37.11 degrees C) oral BP sitting:   140 / 82  (left arm) Cuff size:   large  Vitals Entered By: Lucious Groves CMA (January 29, 2011 1:33 PM) CC: Previous elbow injury still bothersome./kb Is Patient Diabetic? Yes Pain Assessment Patient in pain? no      Comments Right elbow. Patient denies having any elbow pain at this time. He notes that it just feels different, hurts only when he works out, and questions if there if fluid build up.    History of Present Illness: 65 yo man here today for R elbow injury.  tripped and fell  ~6 weeks ago and banged the elbow.  still has 'a fluid sac'.  painful when pressure applied (leaning) or working out.  no redness or warmth.  no fevers.  Current Medications (verified): 1)  Cymbalta 60 Mg Cpep (Duloxetine Hcl) .... Take One Tablet Daily 2)  Enalapril Maleate 2.5 Mg Tabs (Enalapril Maleate) .... Take One Tablet Daily 3)  Clonazepam 0.5 Mg Tabs (Clonazepam) .... Take One Tablet Qid 4)  Simvastatin 40 Mg Tabs (Simvastatin) .... Take One Tablet At Bedtime 5)  Aspirin 81 Mg Tbec (Aspirin) .... Take One Tablet Daily 6)  Meclizine Hcl 25 Mg  Tabs (Meclizine Hcl) .... Take One Tablet Daily As Needed For Vertigo 7)  Symbicort 80-4.5 Mcg/act Aero (Budesonide-Formoterol Fumarate) .... Two Puffs Twice Daily 8)  Flonase 50 Mcg/act  Susp (Fluticasone Propionate) .... Use Two Sprays in Each Nostril Twice Daily As Needed. 9)  Ec-Naprosyn 500 Mg Tbec (Naproxen) .[name].. 1 Tab By Mouth Two Times As Needed 10)  Saphris 5 Mg Subl (Asenapine Maleate) .[name].. 1 Tablet in Pm 11)  Onetouch Ultra Test  Strp (Glucose Blood) .... Pt Test Once Daily 12)  Lamictal Xr 50 Mg Xr24h-Tab (Lamotrigine) .[name].. 1 By Mouth Daily 13)  Ambien Cr 12.5 Mg Cr-Tabs (Zolpidem  Tartrate) .[name].. 1 By Mouth As Needed 14)  Trazodone Hcl 50 Mg Tabs (Trazodone Hcl) .... 3 By Mouth As Needed 15)  Cogentin 2mg  .... 1 By Mouth Daily 16)  Gabapentin 300 Mg Caps (Gabapentin) .[name].. 1 Qam and 2 Qpm 17)  Cvs Spectravite Senior  Tabs (Multiple Vitamins-Minerals) .[name].. 1 By Mouth Daily 18)  Actoplus Met 15-850 Mg Tabs (Pioglitazone Hcl-Metformin Hcl) .... Take One Tablet Two Times A Day 19)  Allegra Allergy 180 Mg Tabs (Fexofenadine Hcl) .... Once Daily  Allergies (verified): 1)  ! Percocet 2)  ! Depakote 3)  ! Celexa 4)  ! Paxil 5)  ! Wellbutrin 6)  ! * Smz 7)  ! * Symbiax 8)  ! * Risperidone  Review of Systems      See HPI  Physical Exam  General:  Overweight white male in NAD Extremities:  R elbow w/ soft, mobile, 4 cm diameter olecranon bursa swelling.  + TTP   Impression & Recommendations:  Problem # 1:  OLECRANON BURSITIS (ICD-726.33) Assessment New given presence for 6 weeks will refer to ortho for possible drainage.  Pt expresses understanding and is in agreement w/ this plan. Orders: Orthopedic Referral (Ortho)  Complete Medication List: 1)  Cymbalta 60 Mg Cpep (Duloxetine hcl) .... Take one tablet daily  2)  Enalapril Maleate 2.5 Mg Tabs (Enalapril maleate) .... Take one tablet daily 3)  Clonazepam 0.5 Mg Tabs (Clonazepam) .... Take one tablet qid 4)  Simvastatin 40 Mg Tabs (Simvastatin) .... Take one tablet at bedtime 5)  Aspirin 81 Mg Tbec (Aspirin) .... Take one tablet daily 6)  Meclizine Hcl 25 Mg Tabs (Meclizine hcl) .... Take one tablet daily as needed for vertigo 7)  Symbicort 80-4.5 Mcg/act Aero (Budesonide-formoterol fumarate) .... Two puffs twice daily 8)  Flonase 50 Mcg/act Susp (Fluticasone propionate) .... Use two sprays in each nostril twice daily as needed. 9)  Ec-naprosyn 500 Mg Tbec (Naproxen) .[name].. 1 tab by mouth two times as needed 10)  Saphris 5 Mg Subl (Asenapine maleate) .[name].. 1 tablet in pm 11)  Onetouch Ultra Test Strp (Glucose  blood) .... Pt test once daily 12)  Lamictal Xr 50 Mg Xr24h-tab (Lamotrigine) .[name].. 1 by mouth daily 13)  Ambien Cr 12.5 Mg Cr-tabs (Zolpidem tartrate) .[name].. 1 by mouth as needed 14)  Trazodone Hcl 50 Mg Tabs (Trazodone hcl) .... 3 by mouth as needed 15)  Cogentin 2mg   .... 1 by mouth daily 16)  Gabapentin 300 Mg Caps (Gabapentin) .[name].. 1 qam and 2 qpm 17)  Cvs Spectravite Senior Tabs (Multiple vitamins-minerals) .[name].. 1 by mouth daily 18)  Actoplus Met 15-850 Mg Tabs (Pioglitazone hcl-metformin hcl) .... Take one tablet two times a day 19)  Allegra Allergy 180 Mg Tabs (Fexofenadine hcl) .... Once daily  Patient Instructions: 1)  We'll notify you of your Ortho appt- they will most likely need to drain that 2)  Call me w/ any questions or concerns 3)  Hang in there! 4)  Happy [place] Day!   Orders Added: 1)  Orthopedic Referral [Ortho] 2)  Est. Patient Level III [04540]

## 2011-02-21 LAB — BASIC METABOLIC PANEL
BUN: 20 mg/dL (ref 6–23)
CO2: 24 mEq/L (ref 19–32)
Calcium: 9 mg/dL (ref 8.4–10.5)
Chloride: 107 mEq/L (ref 96–112)
Creatinine, Ser: 0.88 mg/dL (ref 0.4–1.5)
GFR calc Af Amer: >60 mL/min (ref >60)
GFR calc non Af Amer: >60 mL/min (ref >60)
Glucose, Bld: 163 mg/dL — ABNORMAL HIGH (ref 70–99)
Potassium: 5 mEq/L (ref 3.5–5.1)
Sodium: 138 mEq/L (ref 135–145)

## 2011-02-21 LAB — POCT HEMOGLOBIN-HEMACUE: Hemoglobin: 14.7 g/dL (ref 13.0–17.0)

## 2011-02-21 LAB — GLUCOSE, CAPILLARY: Glucose-Capillary: 106 mg/dL — ABNORMAL HIGH (ref 70–99)

## 2011-02-23 LAB — POCT I-STAT, CHEM 8
BUN: 19 mg/dL (ref 6–23)
Calcium, Ion: 1.21 mmol/L (ref 1.12–1.32)
Chloride: 107 mEq/L (ref 96–112)
Creatinine, Ser: 1 mg/dL (ref 0.4–1.5)
Glucose, Bld: 94 mg/dL (ref 70–99)
HCT: 40 % (ref 39.0–52.0)
Hemoglobin: 13.6 g/dL (ref 13.0–17.0)
Potassium: 4.4 mEq/L (ref 3.5–5.1)
Sodium: 141 mEq/L (ref 135–145)
TCO2: 25 mmol/L (ref 0–100)

## 2011-02-23 LAB — GLUCOSE, CAPILLARY: Glucose-Capillary: 78 mg/dL (ref 70–99)

## 2011-02-26 ENCOUNTER — Ambulatory Visit: Payer: Self-pay | Admitting: Family Medicine

## 2011-03-01 ENCOUNTER — Ambulatory Visit (INDEPENDENT_AMBULATORY_CARE_PROVIDER_SITE_OTHER): Payer: Medicare Other | Admitting: Family Medicine

## 2011-03-01 ENCOUNTER — Encounter: Payer: Self-pay | Admitting: Family Medicine

## 2011-03-01 VITALS — BP 100/60 | Temp 98.4°F | Ht 70.0 in | Wt 261.0 lb

## 2011-03-01 DIAGNOSIS — E119 Type 2 diabetes mellitus without complications: Secondary | ICD-10-CM

## 2011-03-01 LAB — HEMOGLOBIN A1C: Hgb A1c MFr Bld: 6.5 % (ref 4.6–6.5)

## 2011-03-01 LAB — BASIC METABOLIC PANEL
BUN: 30 mg/dL — ABNORMAL HIGH (ref 6–23)
CO2: 26 mEq/L (ref 19–32)
Calcium: 9 mg/dL (ref 8.4–10.5)
Chloride: 107 mEq/L (ref 96–112)
Creatinine, Ser: 1.2 mg/dL (ref 0.4–1.5)
GFR: 68.32 mL/min (ref >60.00)
Glucose, Bld: 109 mg/dL — ABNORMAL HIGH (ref 70–99)
Potassium: 4.1 mEq/L (ref 3.5–5.1)
Sodium: 141 mEq/L (ref 135–145)

## 2011-03-01 NOTE — Assessment & Plan Note (Signed)
Due for A1C today.  Pt admits to poor diet recently- will keep this in mind when reviewing results.  UTD on eye exam.  Will do foot exam at [facility] in July.

## 2011-03-01 NOTE — Progress Notes (Signed)
  Subjective:    Patient ID: Ryan Zhang, male    DOB: 1946-06-27, 65 y.o.   MRN: [id number]  HPI DM- chronic problem for pt, typically well controlled on Actoplus Met.  CBGs 'running a little high' per pt report.  Has been eating sweets in order to deal w/ depression.  Denies symptomatic lows.  Increased exercise- Y 3x/week.  No CP, SOB, visual changes, edema.   Review of Systems For ROS see HPI     Objective:   Physical Exam  Constitutional: He is oriented to person, place, and time. He appears well-developed and well-nourished.  HENT:  Head: Normocephalic and atraumatic.  Eyes: Conjunctivae and EOM are normal. Pupils are equal, round, and reactive to light.  Neck: Normal range of motion. Neck supple.  Cardiovascular: Normal rate, regular rhythm, normal heart sounds and intact distal pulses.   Pulmonary/Chest: Effort normal and breath sounds normal. No respiratory distress.  Abdominal: Soft. Bowel sounds are normal. He exhibits no distension. There is no tenderness.  Musculoskeletal: He exhibits no edema.  Neurological: He is alert and oriented to person, place, and time.  Skin: Skin is warm and dry.  Psychiatric: He has a normal mood and affect. His behavior is normal.          Assessment & Plan:

## 2011-03-01 NOTE — Patient Instructions (Signed)
Schedule your complete physical in 3 months- do not eat before this appt Keep up the good work on diet and exercise- you can do it!!! We'll notify you of your lab results Call with any questions or concerns Happy Spring!

## 2011-03-09 ENCOUNTER — Encounter: Payer: Self-pay | Admitting: Family Medicine

## 2011-03-09 ENCOUNTER — Ambulatory Visit (INDEPENDENT_AMBULATORY_CARE_PROVIDER_SITE_OTHER): Payer: Medicare Other | Admitting: Family Medicine

## 2011-03-09 DIAGNOSIS — R1310 Dysphagia, unspecified: Secondary | ICD-10-CM

## 2011-03-09 MED ORDER — OMEPRAZOLE 40 MG PO CPDR: 40.0000 mg | DELAYED_RELEASE_CAPSULE | Freq: Every day | ORAL | Status: DC

## 2011-03-09 NOTE — Assessment & Plan Note (Signed)
Pt's sxs sound as if he made need a dilation.  Will start PPI and refer to GI for complete evaluation and tx.

## 2011-03-09 NOTE — Progress Notes (Signed)
  Subjective:    Patient ID: Ryan Zhang, male    DOB: April 26, 1946, 65 y.o.   MRN: [id number]  HPI Dysphagia- has been an ongoing problem but he forgets to mention it.  sxs started about 6-9 months ago.  Nearly a daily occurrence.  Food will collect in back of throat, he will cough it up, 'chew it some more' and then swallow.  No trouble w/ liquids.  Has hx of hiatal hernia.  Doesn't occur w/ any particular type of food- starches, veggies, meat, etc.  Has hx of GERD- not on meds and hasn't been recently.  GIJarold Motto.   Review of Systems For ROS see HPI     Objective:   Physical Exam  Constitutional: He appears well-developed and well-nourished. No distress.  HENT:  Mouth/Throat: Uvula is midline. No oropharyngeal exudate, posterior oropharyngeal edema or posterior oropharyngeal erythema.       Narrow palantine arch w/ large tongue- difficult to visualize tonsils/posterior pharynx but no obvious abnormalities  Neck: Normal range of motion. Neck supple.          Assessment & Plan:

## 2011-03-09 NOTE — Patient Instructions (Signed)
Start the Omeprazole daily for your reflux We'll notify you of your GI appt Make sure your food is well chewed and you drink plenty of fluids Call with any questions or concerns Hang in there!!!

## 2011-03-26 ENCOUNTER — Ambulatory Visit (INDEPENDENT_AMBULATORY_CARE_PROVIDER_SITE_OTHER): Payer: Medicare Other | Admitting: Family Medicine

## 2011-03-26 VITALS — BP 130/68 | Temp 98.6°F | Wt 261.5 lb

## 2011-03-26 DIAGNOSIS — J301 Allergic rhinitis due to pollen: Secondary | ICD-10-CM

## 2011-03-26 NOTE — Progress Notes (Signed)
  Subjective:    Patient ID: Ryan Zhang, male    DOB: 1946-03-25, 65 y.o.   MRN: [id number]  HPI Sore throat- sxs started Wed.  No fevers.  'blah feeling'.  + nasal congestion due to seasonal allergies.  No ear pain or cough.  + sick contacts.  No facial pain/pressure.   Review of Systems For ROS see HPI     Objective:   Physical Exam  Constitutional: He appears well-developed and well-nourished. No distress.  HENT:  Head: Normocephalic and atraumatic.       No TTP over sinuses + turbinate edema + PND TMs normal bilaterally  Eyes: Conjunctivae and EOM are normal. Pupils are equal, round, and reactive to light.  Neck: Normal range of motion. Neck supple.  Cardiovascular: Normal rate, regular rhythm and normal heart sounds.   Pulmonary/Chest: Effort normal and breath sounds normal. No respiratory distress. He has no wheezes.  Lymphadenopathy:    He has no cervical adenopathy.  Skin: Skin is warm and dry.          Assessment & Plan:

## 2011-03-26 NOTE — Assessment & Plan Note (Signed)
Pt's sore throat is most likely due to PND.  No evidence of bacterial infxn.  No need for abx.  Reviewed supportive care and red flags that should prompt return.  Pt expressed understanding and is in agreement w/ plan.

## 2011-03-26 NOTE — Patient Instructions (Signed)
Your sore throat appears to be related to the post nasal drip Drink plenty of fluids Call if your symptoms change or worsen Have a great weekend!

## 2011-03-30 NOTE — Op Note (Signed)
NAME:  Ryan Zhang, Ryan Zhang                 ACCOUNT NO.:  [id number]   MEDICAL RECORD NO.:  [id number]          PATIENT TYPE:  AMB   LOCATION:  [facility]                         FACILITY:  [facility]   PHYSICIAN:  Mark C. Vernie Ammons, M.D.  DATE OF BIRTH:  1946-05-02   DATE OF PROCEDURE:  03/31/2009  DATE OF DISCHARGE:                               OPERATIVE REPORT   PREOPERATIVE DIAGNOSIS:  Phimosis/recurrent balanitis.   POSTOPERATIVE DIAGNOSIS:  Phimosis/recurrent balanitis.   PROCEDURE:  Recircumcision.   SURGEON:  Mark C. Vernie Ammons, M.D.   ANESTHESIA:  General with local supplement.   SPECIMENS:  Penile shaft skin to pathology.   BLOOD LOSS:  Less than 1 mL.   DRAINS:  None.   COMPLICATIONS:  None.   INDICATIONS:  The patient is a 65 year old diabetic who has had  difficulty with recurrent balanitis.  He developed an open sore on his  shaft skin and applied triple antibiotic which allowed that to resolve,  but the skin became scarred and phimotic.  He had recurrence of his  balanitis and we therefore have discussed excision of this scarred  tissue to allow the skin to be retracted in order to prevent recurrent  balanitis and treat his secondary phimosis.  I went over the procedure  in detail as well as the risks, complications and alternatives with the  patient.  He understands and has elected to proceed.   DESCRIPTION OF OPERATION:  After informed consent, the patient was  brought to the major OR and placed on the table, administered general  anesthesia.  His genitalia was then sterilely prepped and draped and an  official time-out was then performed.  I injected 0.25% plain Marcaine  both as a ring block at the base of the penis as well as a dorsal penile  block.  I then was able to retract the shaft skin from around the head  of the penis and reprepped this with Betadine.  I noted where his  previous circumcision incision had been made and I marked that with a  marker and incised along  that area.  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  The skin while retracted revealed white discoloration  and scarring consistent with his phimosis, scarring and possible BXO of  the shaft skin as well.  I marked a second incision line on the more  proximal shaft skin in order to excise as much of the abnormal skin as  possible without removing too much of the shaft skin.  A circumferential  incision was made at this location and the abnormal skin was excised  sharply.  Bleeding points were cauterized and I then reapproximated the  skin in the area of the frenulum with 2 interrupted 3-0 chromic sutures  and then placed a U stitch at the 6 o'clock position and a second stitch  at the 12 o'clock position, reapproximating the skin edges.  I then  reapproximated the skin edges with running 3-0 chromic  on each side.  There was a good excellent cosmetic result at the end of the operation.  I therefore applied Neosporin, folded 4x4s loosely around the penis, and  then secured this loosely with Coban.  The patient was awakened and  taken to recovery room in stable and satisfactory condition.  He  tolerated the procedure well and there were no intraoperative  complications.  His foreskin was sent to pathology due to the  abnormality noted, and he was given a prescription for Keflex 250 mg  t.i.d. for 5 days and Vicodin HP #36.  He will follow up in my office in  4 weeks and sooner if he has difficulty.      Mark C. Vernie Ammons, M.D.  Electronically Signed     MCO/MEDQ  D:  03/31/2009  T:  03/31/2009  Job:  [id number]

## 2011-03-30 NOTE — Op Note (Signed)
NAME:  Ryan Zhang, Ryan Zhang NO.:  [id number]   MEDICAL RECORD NO.:  [id number]          PATIENT TYPE:  AMB   LOCATION:  [facility]                          FACILITY:  [facility]   PHYSICIAN:  Cindee Salt, M.D.       DATE OF BIRTH:  July 31, 1946   DATE OF PROCEDURE:  06/10/2009  DATE OF DISCHARGE:                               OPERATIVE REPORT   PREOPERATIVE DIAGNOSIS:  Pyogenic granuloma, left middle finger.   POSTOPERATIVE DIAGNOSIS:  Pyogenic granuloma, left middle finger.   OPERATION:  Excision of pyogenic granuloma, left middle finger.   SURGEON:  Cindee Salt, MD   ASSISTANT:  Carolyne Fiscal, RN   ANESTHESIA:  Forearm-based IV regional.   ANESTHESIOLOGIST:  Kaylyn Layer. Michelle Piper, MD   HISTORY:  The patient is a 65 year old male with a history of an injury  to his left middle finger.  He has developed pyogenic granuloma adjacent  to the nail plate distally.  He is desirous of having this excised and  that conservative treatment has not resolved this for him.  Pre, peri,  and, postoperative course have been discussed along with risks and  complications.  He is aware that there is no guarantee with the surgery;  possibility of infection; recurrence; injury to arteries, nerves, or  tendons; incomplete relief of symptoms; dystrophy; abnormality of the  nail plate.  In the preoperative area, the patient is seen, the  extremity marked by both the patient and surgeon, antibiotic given.   PROCEDURE:  The patient was brought to the operating room where a  forearm-based IV regional anesthetic was carried out without difficulty  under the direction of Dr. Michelle Piper who has prepped using ChloraPrep in  supine position with the left arm free.  Time-out was taken confirming  the patient and procedure.  A dry time of 3 minutes was given before  draping him.  The area was isolated.  The stalk identified.  An  elliptical incision was made excising the entire pyogenic granuloma from  the nail fold.  The nail  plate was partially removed to allow access.  The specimen was sent to pathology.  This was excised in toto.  The bed  was then curetted and irrigated.  Bleeders electrocauterized with  bipolar.  The skin was then closed with interrupted 6-0 chromic sutures  realigning and closing the entire area with epithelium.  The local  infiltration was given of 0.25% Marcaine without epinephrine as  metacarpal block for pain control.  A sterile compressive dressing and  splint to the finger was applied.  He was taken to the recovery room for  observation in satisfactory condition.  He will be discharged home, to  return to the [facility] in 1 week, on [place] [facility].           ______________________________  Cindee Salt, M.D.    GK/MEDQ  D:  06/10/2009  T:  06/10/2009  Job:  [id number]

## 2011-04-01 ENCOUNTER — Telehealth: Payer: Self-pay | Admitting: *Deleted

## 2011-04-01 NOTE — Telephone Encounter (Signed)
Pt denies having chest pain noting that it is heartburn. He will take Omeprazole tonight and pick up Nexium samples in the morning.

## 2011-04-01 NOTE — Telephone Encounter (Signed)
Pt called and states that his heartburn is getting worse. He states that they omeprazole does not seem to be helping at all. Please advise.

## 2011-04-01 NOTE — Telephone Encounter (Signed)
We can provide samples of nexium and see if this improves sxs.  Please make sure he's not actually having chest pain rather than heart burn- if it's chest pain he should go to the ER

## 2011-04-02 NOTE — Discharge Summary (Signed)
NAME:  Ryan Zhang, Ryan Zhang NO.:  [id number]   MEDICAL RECORD NO.:  [id number]          PATIENT TYPE:  IPS   LOCATION:  0505                          FACILITY:  [facility]   PHYSICIAN:  Jasmine Pang, M.D. DATE OF BIRTH:  1946-10-26   DATE OF ADMISSION:  03/24/2006  DATE OF DISCHARGE:  03/28/2006                                 DISCHARGE SUMMARY   IDENTIFYING INFORMATION:  The patient was a 65 year old Caucasian married  male who was admitted on a voluntary basis on Mar 24, 2006.   HISTORY OF PRESENT ILLNESS:  The patient has a history of bipolar disorder  and has been very depressed with suicidal thoughts x1 week after his wife  told him she would sell their home.  They had separated six months ago and  he is living alone in the home with pets.  He is also having some conflict  with his 69 year old daughter who has been acting out.  The patient states  he is feeling depressed, sad, demoralized and hopeless.  He has suicidal  ideation.  He states his wife wants to stay separated even after they sell  the house.  She wants to have separate apartments.  He showed me a list she  had given him on things to change before she would return home.  He also  discussed conflict not only with his 42 year old daughter who was acting  out, but with his wife about the daughter.  His wife feels that he is too  quick to blame their daughter without investigating.   Upon admission, the patient's mental status revealed an alert, pleasant,  cooperative male who was dressed casually.  There was positive psychomotor  retardation.  Eye contact was intermittent.  Speech soft and slow, decreased  production, no pressure.  Mood depressed, anxious, affect constricted.  There was positive suicidal ideation but he was able to contract for safety  in the hospital.  No homicidal ideation or self-injurious behavior.  There  were no auditory or visual hallucinations.  No paranoia or delusions.  Thought  processes were logical and goal-directed.  Thought content anxiety  over his wife possibly selling their house.  Cognitive exam was grossly  intact.  He was oriented x3.  Insight was somewhat minimal.  Judgment fair.   PAST PSYCHIATRIC HISTORY:  The patient sees Valinda Hoar, nurse  practitioner.  This is the fifth Behavioral Health admission with his last  admission in Augustof 2006, which was followed by ECT in [facility] 2006.  He was initially diagnosed in 1997 with bipolar disorder in [place],  [place].  He has had multiple suicide attempts.   FAMILY PSYCHIATRIC HISTORY:  Unknown.   SUBSTANCE ABUSE HISTORY:  The patient denies.   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   PAST MEDICAL HISTORY:  The patient's primary care doctor is Dr. Modesto Charon at  [facility].  Medical problems include diabetes type 2,  dyslipidemia and Meniere's disease.   MEDICATIONS:  Ambien CR 12.5 mg,  1 p.o. q.h.s. p.r.n., Geodon 80 mg, 2  tablets q.p.m., Cogentin 2 mg, 1 q.p.m., Neurontin 300 mg p.o. t.i.d.,  Klonopin 0.5 mg p.o. t.i.d., Allegra 180 mg q.d., Flovent inhaler 50 mcg  twice daily, Qvar 40 mcg inhaler twice daily, ACTOplus 15/200 mg p.o.  b.i.d., Advicor 500/20 mg, 2 pills in the evenings, enalapril 2.5 mg q.a.m.,  meclizine 25 mg, 1 p.o. p.r.n. dizziness, aspirin 325 mg 1 p.o. q.d.   ALLERGIES:  The patient has allergies to PERCOCET, DEPAKOTE, CELEXA, PAXIL,  SULFAMETHOXAZOLE, WELLBUTRIN, SYMBYAX.  He has no food allergies or  intolerance.   PHYSICAL EXAMINATION:  The patient's physical exam was done by Kari Baars, nurse practitioner.  The patient was found to have no acute medical  problems.   LABORATORY DATA:  CBC was within normal limits.  Basic chem panel was within  normal limits.  The hepatic profile was within normal limits.  Hemoglobin  A1C is less than 6 per patient's report.   HOSPITAL  COURSE:  Upon admission, the patient was started on his home  medications of Ambien CR 12.5 mg p.o. q.h.s. p.r.n., Geodon 80 mg, 2 tablets  q.p.m., Cogentin 2 mg, 1 p.o. q.p.m., Neurontin 300 mg, 1 p.o. t.i.d.,  Klonopin 0.5 mg, 1 p.o. t.i.d., Allegra 180 mg, 1 p.o. q.d., Flovent inhaler  50 mcg p.o. b.i.d., Qvar 40 mcg inhaler b.i.d., ACTOplus 15/200 mg p.o.  b.i.d., Advicor 500/20 mg, 2 p.o. q.p.m., enalapril 2.5 mg q.a.m., meclizine  25 mg p.r.n. dizziness, aspirin 325 mg p.o. q.d.  On Mar 24, 2006, he was  also ordered Flonase nasal spray 50 mcg 1-2 sprays each nostril p.r.n.  b.i.d.  The Qvar was discontinued because he was no longer taking this.  The  meclizine was ordered at 25 mg p.o. b.i.d. p.r.n.  The ACTOplus was  discontinued.  Instead, he was given ACTOplus 15/500 mg p.o. b.i.d.  The  Flovent inhaler was discontinued.  On Mar 25, 2006, the patient signed a 58-  hour request for discharge.  Also the Ambien was discontinued due to causing  blackouts.  The Klonopin was increased to 0.5 mg p.o. q.i.d. and 0.5 mg p.o.  t.i.d. and 0.5 mg p.o. b.i.d. p.r.n. anxiety.  He was also moved off the 400  [place] where he had initially been placed onto the 300/500 [place].  An  antidepressant was not started while in the hospital.  However, I called the  patient today to discuss starting an antidepressant and tried to determine  what medicine he is not allergic to.  He is allergic to many of the  antidepressants and it is possible Cymbalta may be helpful if he has not  been on this before.  I have not spoken directly with him yet but will.   The patient continued to be quite depressed.  He signed a 72-hour request  for discharge on Mar 25, 2006.  On Mar 26, 2006, he remained depressed and  sad with constricted affect.  He had insomnia with difficulty falling  asleep.  Appetite had been good.  His wife had visited twice and he reported this had gone okay.  However, he was worried about her because  yesterday  she was down.  He was unsure whether he wanted a family session with her.  He was admitted to being somewhat afraid of what she might have to say with  regards to their separation.  On Mar 27, 2006, the patient remained sad and  depressed.  He was cooperative but somewhat isolative.  He began to feel  more like he could contract for safety when discharged.  He had not been  able to contract for this on admission.  He states his wife visited and this  had gone well.  They had had a good conversation.   On Mar 28, 2006, the patient was ready for discharge today and can contract  for safety.  He still has some psychomotor retardation with intermittent eye  contact.  His mood was less depressed and anxious.  Affect constricted.  He  denies suicidal or homicidal ideation.  Again, as stated above, he was able  to contract for safety now.  There was no of self-injurious behavior.  No  auditory or visual hallucinations.  No paranoia or delusions.  Thoughts were  linear, logical and goal-directed.  No predominant theme to his thought  content.  Cognitive was grossly within normal limits.  The patient's wife  was concerned about him being discharged.  However, she was not able to give  any information about dangerousness to self or others.  When I discussed the  possibility of commitment, if there is criteria, she did not feel commitment  was the answer.  She was very frustrated about his continued depressed mood  and dependence on her.  The patient will start IOP when he leaves, will  start tomorrow.  He will be started on an antidepressant in this program.  I  will talk with his doctor, Dr. Ladona Ridgel.  There was a family session at  discharge with his wife.  He stated he was feeling better.  Wife stated she  wanted him to be more active in life, to stop depending on her for all of  his needs.  The patient's wife said she sees him three times a week even  though they are separated.  He  acknowledged that he would try to stop  depending so much on his wife.   DISCHARGE DIAGNOSES:  AXIS I:  Bipolar disorder, depressed mood.  AXIS II:  None.  AXIS III:  Diabetes mellitus, dyslipidemia, arthritis not otherwise  specified.  AXIS IV:  Severe (stress and grief about separation from wife).  AXIS V:  GAF upon admission 35; GAF highest past year 64; GAF at the time of  discharge 45.   ACTIVITY/DIET:  There were no specific activity level or dietary  restrictions.   DISCHARGE MEDICATIONS:  1.  Klonopin 0.5 mg q.i.d.  2.  Geodon 160 mg q.h.s.  3.  Ambien CR 12.5 mg q.h.s.  4.  Cogentin 2 mg q.h.s.  5.  Neurontin 300 mg three times daily.   The patient will also continue his medical medications as ordered including:   1.  Flonase nasal spray.  2.  Meclizine 25 mg p.o. b.i.d.  3.  ACTOplus 15/500 mg p.o. b.i.d.  4.  Advicor 500/20 mg, 2 pills q.h.s.  5.  Enalapril 2.5 mg q.a.m.  6.  Aspirin 325 mg, 1 pill daily.   POST-HOSPITAL CARE PLANS:  The patient will be transferred into the  intensive outpatient program.  I will speak with his doctor, Carolanne Grumbling,  M.D., about starting an antidepressant, possibly Cymbalta.  Post-hospital  care plans will be with Valinda Hoar.  She will see him Thursday, April 28, 2006 at 1:45.  He will follow up with Ina Homes on Wednesday,  May23, 2007 at 3 p.m.      Jasmine Pang, M.D.  Electronically  Signed     BHS/MEDQ  D:  03/29/2006  T:  03/29/2006  Job:  [id number]
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
## 2011-04-02 NOTE — Discharge Summary (Signed)
NAME:  Ryan Zhang, Ryan Zhang NO.:  [id number]   MEDICAL RECORD NO.:  [id number]                   PATIENT TYPE:  IPS   LOCATION:  0503                                 FACILITY:  [facility]   PHYSICIAN:  Geoffery Lyons, M.D.                   DATE OF BIRTH:  1946-07-10   DATE OF ADMISSION:  10/21/2003  DATE OF DISCHARGE:  11/01/2003                                 DISCHARGE SUMMARY   CHIEF COMPLAINT AND PRESENT ILLNESS:  This was the third admission to [facility] for this 65 year old married male voluntarily  admitted.  History of depression.  Went off the Vistaril because it was too  much, could not stand up.  Depression worsened in the past month, tried to  return to work, could not do the job.  Feels hopeless facing work pressures,  too perfectionistic, easily distracted, suicidal but no current plan.   PAST PSYCHIATRIC HISTORY:  Arbutus Ped, a therapist, manages this patient  for medications.  Previous suicide attempts.  Diagnosed bipolar.   ALCOHOL/DRUG HISTORY:  Denies and there is no evidence.   MEDICAL HISTORY:  Diabetes mellitus, type 2, Meniere's and asthma.   MEDICATIONS:  Lamictal 100 mg twice a day, Provigil 200 mg daily, Lexapro 20  mg daily, lithium 600 mg twice a day, Klonopin 0.5 mg twice a day, Advair  500\20 mg daily, Flonase and Allegra.   PHYSICAL EXAMINATION:  Performed and failed to show any acute findings.   MENTAL STATUS EXAM:  Fully alert, pleasant, cooperative male.  Depressed.  Constricted affect, psychomotor retardation.  Speech with slow production.  Mood depressed, helpless, hopeless.  Thought processes with suicidal  ruminations without a plan, sense of wanting to give up.   ADMISSION DIAGNOSES:   AXIS I:  Bipolar disorder, depressed.   AXIS II:  No diagnosis.   AXIS III:  1. Meniere's.  2. Diabetes mellitus, type 2.  3. Asthma.   AXIS IV:  Moderate.   AXIS V:  Global Assessment of Functioning upon  admission 30; highest Global  Assessment of Functioning in the last year 60.   LABORATORY DATA:  Lithium 1.29.  Other labs within normal limits.   HOSPITAL COURSE:  He was maintained on his medications, Ambien for sleep,  Flonase, Allegra, Advair, Provigil, Lexapro 20 mg per day, lithium 300 mg, 2  tabs at night, Lamictal 100 mg in the morning, Klonopin 0.5 mg.  Lithium was  increased back to 600 mg in the morning and 600 mg at night and Lamictal was  placed at 100 mg twice a day, Provigil at 200 mg per day.  He developed some  skin reaction so he was given Benadryl.  He was pretty despondent, unable to  talk, concentrate, difficulty carrying through, very tired, very  overwhelmed, decreased concentration, sense of hopelessness and  helplessness, very tearful.  We went ahead  and discussed options and we went  off Provigil and gave it a try with Dexedrine.  Discussed pros and cons of  giving this a try.  Continued to endorse a depressed mood, difficulty  tolerating noises, sounds, becoming more overwhelmed but was willing to give  medications a try.  We went ahead and increased Dexedrine.  Experienced  increased anxiety, not sure if it was secondary to the Dexedrine, became  more irritable.  He was not able to sleep, so we went ahead and discontinued  the Dexedrine and at work to stabilize.  Experienced a lot of anxiety,  irritability, agitation.  He was placed on Seroquel 50 mg twice a day and  100 mg at night.  By October 30, 2003, he was slowing down.  Still  irritable, hyperthymic but better.  With the Seroquel, he was able to sleep.  On October 31, 2003, he had settled down.  No suicidal ideation, no  homicidal ideation.  Not as irritable but anxious and worrying, concerns  about his situation, about his ability to work.  On November 01, 2003, he  was stable enough.  No suicidal ideation.  No homicidal ideation.  We went  ahead and discharged to outpatient follow-up.  He was able to  sleep with the  Seroquel, so he maintained that.  Overall, he was feeling better but doubted  he was going to be able to return to his previous level of functioning and  function at work as expected.   DISCHARGE DIAGNOSES:   AXIS I:  Bipolar disorder, depressed.   AXIS II:  No diagnosis.   AXIS III:  1. Diabetes mellitus, type 2.  2. Meniere's.  3. Asthma.   AXIS IV:  Moderate.   AXIS V:  Global Assessment of Functioning upon discharge 50.   DISCHARGE MEDICATIONS:  1. Flonase.  2. Allegra.  3. Advair.  4. Lexapro 20 mg per day.  5. Lithium 300 mg, 2 twice a day.  6. Lamictal 100 mg twice a day.  7. Seroquel 100 mg, 1-2 at bedtime.  8. Klonopin 0.5 mg twice a day.   FOLLOW UP:  Discharged to [facility].  Prognosis  guarded in terms of work.  He is probably not going to be able to return to  work given the jobs that he performs and the need for his cognitive  abilities to be intact.  Still has difficulty with the concentration, with  following through and needs further stabilization.                                               Geoffery Lyons, M.D.    IL/MEDQ  D:  11/27/2003  T:  11/28/2003  Job:  [id number]

## 2011-04-02 NOTE — Telephone Encounter (Signed)
Patient called back to report that he has changed his diet and feels better---should he still try the Nexium??   Advised him that Dr Beverely Low was not in office and another doctor will have to read this note

## 2011-04-02 NOTE — Discharge Summary (Signed)
NAME:  Ryan Zhang, Ryan Zhang NO.:  [id number]   MEDICAL RECORD NO.:  [id number]                   PATIENT TYPE:  IPS   LOCATION:  0306                                 FACILITY:  BH   PHYSICIAN:  Jeanice Lim, M.D.              DATE OF BIRTH:  [birth year]   DATE OF ADMISSION:  09/08/2003  DATE OF DISCHARGE:  09/11/2003                                 DISCHARGE SUMMARY   IDENTIFYING DATA:  This is a 65 year old married Caucasian male voluntarily  admitted with a history of depression, collapsed over the weekend, had bad  job performance and was having thoughts of suicide, trouble with memory,  getting distracted.  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  The patient had a suicide attempt three years ago via overdose  and is followed up by Valinda Hoar, N.P.   MEDICATIONS:  Avandamet, Advicor, Lexapro, Allegra, Provigil, lithium 600 mg  b.i.d. and Ambien.   ALLERGIES:  DEPAKOTE (possibly caused pancreatitis), NEURONTIN, BACTRIM.   PHYSICAL EXAMINATION:  Within normal limits.  Neurologically nonfocal.   LABORATORY DATA:  Routine admission labs within normal limits.   MENTAL STATUS EXAM:  The patient was alert, disheveled, cooperative,  depressed, feeling out of control, confined and unable to function.  Affect  was flat.  Thought processes goal directed.  Thought content negative for  dangerous ideation or psychotic symptoms.  Judgment and insight were fair.   ADMISSION DIAGNOSES:   AXIS I:  Bipolar disorder, depressed-phase.   AXIS II:  Deferred.   AXIS III:  1. Type 2 diabetes.  2. History of gastritis and Meniere's.   AXIS IV:  Moderate (problems with primary support group, occupation as far  as performance problems and other psychosocial issues).   AXIS V:  35/60.   HOSPITAL COURSE:  The patient was admitted and ordered routine p.r.n.  medications and underwent further monitoring.  Was encouraged to  participate  in individual, group and milieu therapy.  The patient identified the job  review and poor performance evaluation as the trigger for him becoming more  depressed and anxious.  Had been feeling depressed and anxious with  difficulty concentrating, difficulty with memory for some time.  The patient  described fleeting suicidal thoughts which resolved and sleep improved.  Wife was contacted regarding family session.  She refused.  She did not like  the fact that the session would be in the evening.  The patient clearly  showed improvement in mood.  Affect was brighter.  IP appeared much more  calm and had increased insight as well as improved coping skills.   CONDITION ON DISCHARGE:  He was discharged in improved condition, looking  forward to his aftercare planning, feeling he could cope better and improve  his job performance.   DISCHARGE MEDICATIONS:  1. Niacin 500 mg q.a.m.  2. Ambien 10 mg,  2 q.h.s.  3. Lamictal 100 mg b.i.d.  4. Provigil 200 mg q.a.m. and 1/2 at 12 p.m.  5. Lithium 300 mg, 2 q.a.m. and 2 q.h.s.  6. Lexapro 10 mg q.a.m.  7. Risperdal 0.5 mg q.h.s.  8. Klonopin 0.25 mg b.i.d. x 1 week and then 1 q.h.s.   FOLLOW UP:  The patient was to follow up with Kipp Brood and Valinda Hoar.   DISCHARGE DIAGNOSES:   AXIS I:  Bipolar disorder, depressed-phase.   AXIS II:  Deferred.   AXIS III:  1. Type 2 diabetes.  2. History of gastritis and Meniere's.   AXIS IV:  Moderate (problems with primary support group, occupation as far  as performance problems and other psychosocial issues).   AXIS V:  Global Assessment of Functioning on discharge 55.                                               Jeanice Lim, M.D.    JEM/MEDQ  D:  10/13/2003  T:  10/13/2003  Job:  [id number]

## 2011-04-02 NOTE — H&P (Signed)
NAME:  Ryan Zhang, Ryan Zhang NO.:  [id number]   MEDICAL RECORD NO.:  [id number]          PATIENT TYPE:  IPS   LOCATION:  0505                          FACILITY:  [facility]   PHYSICIAN:  Jasmine Pang, M.D. DATE OF BIRTH:  05-27-1946   DATE OF ADMISSION:  03/24/2006  DATE OF DISCHARGE:                         PSYCHIATRIC ADMISSION ASSESSMENT   IDENTIFYING INFORMATION:  This is a 65 year old married white male.  This is  a voluntary admission.   HISTORY OF PRESENT ILLNESS:  This patient with a history of bipolar disorder  was referred by his psychiatrist yesterday after expressing suicidal  thoughts for approximately 1 week.  He has been having thoughts about  overdosing on his medications and has a history of multiple prior suicide  attempts by overdose.  He cites the stressor of his wife calling him about a  week ago and discussing the fact that they need to sell the house that he is  living in.  They had separated about 6 months ago because his wife told him  that she wanted to spend more time with their 63 year old daughter.  They  are living together in another dwelling in town.  The patient has been  residing in the family home with the pets.  Approximately 3 weeks ago his  wife sent a note through his therapist to him with a list of improvements  she would like to see in his behavior and demeanor before she could move  back in with him, so the patient has been working diligently on these  things, which include such items as wanting him to make friends more easily,  wanting him to have a brighter affect and be more positive.  He feels that  he has been making progress on this list and now is disappointed and  anxious, learning that she does not want to get back together.  His sleep  has been adequate when he takes Ambien CR but he does have a history of  blackouts on Ambien.  He has been quite anxious during the day, very  depressed and very worried about the future.   He reports compliance with his  medications.   PAST PSYCHIATRIC HISTORY:  The patient is currently followed by Valinda Hoar, the nurse practitioner in practice with Andee Poles, M.D.  This  is his fifth admission to [facility] with his last  admission being in August of 2006, at which time he was treated with  Lamictal 100 mg in the morning and 200 mg at night, Klonopin, trazodone,  Equetro 600 mg in the morning and 400 mg at night, and Geodon 240 mg at  suppertime.  His depression continued to be a major factor.  He was unable  to get rid of suicidal thoughts and was referred for ECT treatments at that  time and did have ECT at [facility] in September of 2006, has had no  hospitalizations since that time.  He has had some short-term memory loss  since the ECT treatments but admits that he feels that they helped him  somewhat.  The patient was initially diagnosed with bipolar disorder in 1997  in [place], [place].   SOCIAL HISTORY:  The patient is a former Research officer, political party for a  Chiropractor, has not worked in several years, currently on disability  for his mental illness, has been married for close to 30 years, has a 76-  year-old daughter who has now dropped out of high school.  There has been  considerable conflict between the patient and his wife over management of  the daughter.  The patient currently lives on his disability check.  He has  no legal charges against him and no history of substance abuse.   MEDICAL HISTORY:  The patient is followed by Dr. Modesto Charon at [facility] at [facility].  Medical problems are diabetes mellitus type 2,  dyslipidemia, and Meniere's disease.   MEDICATIONS:  1.  Aspirin 325 mg daily.  2.  Ambien CR 12.5 mg p.o. h.s. p.r.n. insomnia.  3.  Geodon 80 mg _______tabs at bedtime.  4.  Cogentin 2 mg p.o. daily.  5.  Neurontin 300 mg p.o. t.i.d. which has been prescribed for anxiety.  6.   Klonopin 0.5 mg p.o. t.i.d.  7.  Fexofenadine 180 mg p.o. daily p.r.n. for allergies.  8.  Fluticasone 50 mcg 1 nasal spray each nostril p.r.n. for allergies.  9.  Qvar the patient was on previously but is no longer taking.  10. Actos plus 15/500 mg p.o. daily.  11. Advicor 500/20 mg p.o. b.i.d.  12. Enalapril 2.5 mg daily.  13. Meclizine 25 mg p.o. b.i.d. p.r.n. vertigo.   DRUG ALLERGIES:  Multiple:  The patient has a history of blackouts with  AMBIEN, says that the Ambien CR is usually safe for him.  PERCOCET he is  allergic to, also allergic to DEPAKOTE, CELEXA, PAXIL, SULFAMETHOXAZOLE,  WELLBUTRIN, and SYMBYAX.  Also, CELEXA has caused insomnia in the past.  The  patient has also had intolerance to LEXAPRO and CELEXA which have caused  quite a bit of insomnia.   REVIEW OF SYSTEMS:  Remarkable for patient reporting that his sleep has been  stable as long as he takes the Ambien.  Anxiety becomes worse throughout the  day, as he becomes more anxious having difficulty focusing, becoming  anhedonic with decreased concentration.  Suicidal thoughts for approximately  one week.  No fever or chills.  No constipation, last BM yesterday.  No need  to get up at night to void, no problems with urinary retention, no abdominal  pain, no rash or skin lesions, no fever or chills in the past month.   PAST MEDICAL HISTORY:  Past medical history is remarkable for past history  of asthma, history of Meniere's disease for which he takes Meclizine,  diagnosed with type 2 diabetes in 1997 for which he takes oral agents, and  he reports that his last hemoglobin A1c has been 5.8 and routinely stays  less than 6.  He has a history of a cholecystectomy and a hiatal hernia  repair.  No other surgeries.  Does have a history of neuropathy in the toes  of his right foot, also has some osteoarthritis, primarily affecting his knees and ankles, and a distant history of hepatitis A in childhood.   PHYSICAL  EXAMINATION:  Well-nourished, well-developed male, no acute  distress, overweight.  Hygiene is good, dress is casual and appropriate.  VITAL SIGNS:  5 feet 9 inches tall, 204 pounds, temperature 97.6, pulse 73,  respirations  18, blood pressure 125/82, pulse oximetry was 97%.  HEAD:  Normocephalic and atraumatic.  Male pattern baldness noted.  EENT:  PERRL.  Sclerae are nonicteric, extraocular movements normal, no rhinorrhea,  teeth in good condition, no breath odor.  NECK:  Supple, no thyromegaly, no lymphadenopathy.  CHEST:  Clear to auscultation.  BREAST EXAM:  Deferred.  CARDIOVASCULAR:  S1 and S2 is heard, no clicks, murmurs or gallops.  Apical  rate is synchronous with radial rate.  ABDOMEN:  Rounded, soft, nontender, nondistended.  GENITOURINARY:  Deferred.  EXTREMITIES:  No signs of edema,  SKIN:  Intact, no lesions, no signs of self-mutilation.  Hygiene is good.  Skin is pale, no signs of rash.  NEUROLOGIC:  Cranial nerves II-XII intact, extraocular movements normal,  cerebellar function is intact.  Balance is good.  Neuro is nonfocal.   DIAGNOSTIC STUDIES:  WBC 5.6, hemoglobin 13.3, hematocrit 39.9, platelets  206,000.  Electrolytes:  Sodium 139, potassium 4.0, chloride 107, CO2 27,  BUN 17, creatinine 0.9, random glucose 165.  Liver enzymes:SGOT 21, SGPT 19,  alkaline phosphatase 55, and total bilirubin 1.4.  His TSH is currently  pending, urinalysis and urine drug screen also pending.   MENTAL STATUS EXAM:  Fully alert male, pleasant and cooperative with blunted  affect.  Some slight psychomotor slowing, no signs of EPS.  Affect is  somewhat anxious.  Speech is decreased in production, no pressure.  He is  articulate, fairly fluent.  Mood is depressed and anxious, thought content:  The patient is very worried about the reaction from his wife saying that she  wants to sell the house.  He is terrified that she is going to say that she  wants a divorce from him.  He says that  she came to visit him last night and  at this point she has reassured him that she does not want to divorce him,  but at this point does not want to get back to living together and that has  been quite a disappointment for him.  He says that she will not attend a  family session.  Thought process is logical and coherent, clearly having  suicidal thoughts and thinking about overdosing.  He is initially quite  insistent that he wants to leave today, but as we explore his suicidal  thoughts he clearly cannot contract for safety in the community.  We  discussed his right to sign a 72-hour request for discharge.  Meanwhile, he  has no homicidal thought, does not appear internally stimulated.  Mood is  quite depressed.  Cognitively he is intact and oriented x3.  Impulse and  judgment are within normal limits, insight is adequate, intellect within  normal limits.  ADMISSION DIAGNOSIS:  AXIS I:  Bipolar II disorder, depressed.  AXIS II:  Deferred.  AXIS III:  Diabetes mellitus type 2, dyslipidemia, arthritis not otherwise  specified.  AXIS IV:  Severe, stress and grief with marital relationship and recent  separation.  AXIS V:  Current 35, past year 54.   INITIAL PLAN OF CARE:  Plan is to involuntarily admit the patient to  alleviate his suicidal thought.  We are going to increased his Klonopin at  this point to help address his anxiety, order a family session with his wife  to see if we can talk through some of the issues with them together, and are  going to consider restarting his Lamictal.  Meanwhile, we have also placed a  call to [employer]  and Andee Poles to notify them that this patient  has been admitted to the adult unit and will coordinate with them for  further care.   ESTIMATED LENGTH OF STAY:  5 days.      Margaret A. Lorin Picket, N.P.      Jasmine Pang, M.D.  Electronically Signed    MAS/MEDQ  D:  03/25/2006  T:  03/27/2006  Job:  [id number]

## 2011-04-02 NOTE — Telephone Encounter (Signed)
Please advise 
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
## 2011-04-02 NOTE — Telephone Encounter (Signed)
I spoke with pt and notified that since he is feeling better he can take the Omeprazole he has at home and no need to come by the office.

## 2011-04-05 ENCOUNTER — Encounter: Payer: Self-pay | Admitting: Family Medicine

## 2011-04-05 NOTE — Progress Notes (Signed)
  Subjective:    Patient ID: Ryan Zhang, male    DOB: 1946-10-05, 65 y.o.   MRN: [id number]  HPI    Review of Systems     Objective:   Physical Exam        Assessment & Plan:

## 2011-04-05 NOTE — Progress Notes (Signed)
Pt stopped by the office noting that he thought about his reflux and the Omeprazole he has been taking. Pt states that it is not his diet, he has been having more trouble with it since taking the med. Pt was advised to watch what he eats and was given 3 bottles of Nexium 40mg  and a savings card. Pt will call the office to let us know how it works.

## 2011-04-13 ENCOUNTER — Encounter: Payer: Self-pay | Admitting: Gastroenterology

## 2011-04-13 ENCOUNTER — Ambulatory Visit (INDEPENDENT_AMBULATORY_CARE_PROVIDER_SITE_OTHER): Payer: Medicare Other | Admitting: Gastroenterology

## 2011-04-13 DIAGNOSIS — R1310 Dysphagia, unspecified: Secondary | ICD-10-CM

## 2011-04-13 MED ORDER — ESOMEPRAZOLE MAGNESIUM 40 MG PO CPDR: 40.0000 mg | DELAYED_RELEASE_CAPSULE | Freq: Every day | ORAL | Status: DC

## 2011-04-13 NOTE — Progress Notes (Signed)
This is a 65 year old Caucasian male with chronic bipolar disorder, adult also diabetes, and degenerative arthritis with 2 previous knee replacements. He is now referred for evaluation of progressive solid food dysphagia over the last 20 years. Endoscopic exam by Dr.[place] 2006 was unremarkable, and endoscopic dilation did not help his dysphagia. He has a atypical acid reflux and his used Nexium for several weeks without improvement. He previously had a severe reaction to omeprazole. He also gives a history of pancreatitis from previous Depacon use. Cholecystectomy for cholelithiasis was performed some 20 years ago. Other problems include anxiety, asthma, diverticulosis, kidney stones, obesity, and hyperlipidemia.  Ryan Zhang describes his dysphagia both in his upper esophagus and midesophagus mostly for bread and meats.  He denies problems with liquids. There is no history of hepatobiliary or lower gastrointestinal symptomatology at this time. Colonoscopy approximately one year ago was unremarkable except for diverticulosis.   Current Medications, Allergies, Past Medical History, Past Surgical History, Family History and Social History were reviewed in [employer] record.  Pertinent Review of Systems Negative.. continue swelling of his knees but no leg pain, edema, other neuromuscular or psychiatric problems at this time. He is on multiple psychotropic medications, it appears to be in remission. His asthma has been a problem since childhood, and does not seem to be related to GERD. He has a history of recurrent vertigo from Mnire's disease also hypertension. Family history is noncontributory. The patient is currently disabled. There is no history of alcohol, cigarette, called and said use but the patient is all Naprosyn 500 mg 2 tablets twice a day.    Physical Exam: awake and alert no acute distress appears stated age. I cannot appreciate thyromegaly, lymphadenopathy, and chest  is clear to percussion and auscultation, cardiac exam is unremarkable. There is no hepatosplenomegaly, abdominal masses or tenderness. Bowel sounds are normal. There is edema of the knees bilaterally without evidence of acute inflammation. There is no peripheral edema or phlebitis. Mental status is normal.     Assessment and Plan: barium swallow 2006 showed tertiary contractions and narrowing of the lower esophageal sphincter area certainly suggesting achalasia. Also noted was rather severe spondylosis of the cervical spine. Suspect this patient does have achalasia, and  scheduled him for esophageal manometry , and we will proceed accordingly. He is to continue his Nexium empirically for now. His diverticulosis seems to be doing well, and he denies bowel or irregularity, and is up-to-date with colonoscopy exams. As mentioned above he is status post cholecystectomy. Patient has multiple drug allergies with previous pancreatitis from Depacon use.  Please copy her primary care physician, referring physician, and pertinent subspecialists.    Encounter Diagnosis  Name Primary?  [name] Dysphagia Yes

## 2011-04-13 NOTE — Patient Instructions (Signed)
Your Esophageal Manometry is scheduled for 05/10/2011 at 11am please follow the separate instructions.

## 2011-04-14 MED ORDER — ESOMEPRAZOLE MAGNESIUM 40 MG PO CPDR: 40.0000 mg | DELAYED_RELEASE_CAPSULE | Freq: Every day | ORAL | Status: DC

## 2011-04-14 NOTE — Progress Notes (Signed)
Addended by: Lucious Groves I on: 04/14/2011 09:15 AM   Modules accepted: Orders

## 2011-05-10 ENCOUNTER — Other Ambulatory Visit: Payer: Self-pay | Admitting: Family Medicine

## 2011-05-10 ENCOUNTER — Ambulatory Visit (HOSPITAL_COMMUNITY)
Admission: RE | Admit: 2011-05-10 | Discharge: 2011-05-10 | Disposition: A | Discharge status: Home / Self Care | Payer: Medicare Other | Source: Ambulatory Visit | Attending: Gastroenterology | Admitting: Gastroenterology

## 2011-05-10 DIAGNOSIS — K449 Diaphragmatic hernia without obstruction or gangrene: Secondary | ICD-10-CM | POA: Insufficient documentation

## 2011-05-10 DIAGNOSIS — R1310 Dysphagia, unspecified: Secondary | ICD-10-CM | POA: Insufficient documentation

## 2011-05-10 MED ORDER — PIOGLITAZONE HCL-METFORMIN HCL 15-850 MG PO TABS: 1.0000 | ORAL_TABLET | Freq: Two times a day (BID) | ORAL | Status: DC

## 2011-05-10 NOTE — Telephone Encounter (Signed)
Refill sent.

## 2011-05-14 ENCOUNTER — Encounter: Payer: Self-pay | Admitting: Gastroenterology

## 2011-05-14 ENCOUNTER — Telehealth: Payer: Self-pay | Admitting: *Deleted

## 2011-05-14 DIAGNOSIS — K228 Other specified diseases of esophagus: Secondary | ICD-10-CM

## 2011-05-14 DIAGNOSIS — R1310 Dysphagia, unspecified: Secondary | ICD-10-CM

## 2011-05-14 DIAGNOSIS — K2289 Other specified disease of esophagus: Secondary | ICD-10-CM

## 2011-05-14 NOTE — Telephone Encounter (Signed)
Pt aware of mano results.

## 2011-05-14 NOTE — Progress Notes (Signed)
  Esophageal manometry was completed on 05/10/2011. Results are as follows:  #1 upper esophageal sphincter-there appeared to be normal coordination between pharyngeal contraction and cricopharyngeal relaxation.  #2 lower esophageal sphincter-pressures normal at 31 mmHg with normal relaxation to swallowing.  #3 motility pattern- 50% of waves are aperistaltic with some retrograde peristalsis and multiple simultaneous contractions. Mean amplitude duration is 146 mm of mercury. On 40% of waves were peristaltic in nature.  Assessment: This manometry is not consistent with achalasia. The lower esophageal sphincter pressure is normal with normal relaxation. However, the patient has marked aperistalsis of the esophagus consistent with a nonspecific motility disorder. He has multiple associated comorbid medical problems. I do not think thatBotox injection would help this patient. We will continue symptomatic supportive treatment for his swallowing difficulties.

## 2011-06-07 ENCOUNTER — Ambulatory Visit (INDEPENDENT_AMBULATORY_CARE_PROVIDER_SITE_OTHER): Payer: Medicare Other | Admitting: Family Medicine

## 2011-06-07 ENCOUNTER — Encounter: Payer: Self-pay | Admitting: *Deleted

## 2011-06-07 ENCOUNTER — Encounter: Payer: Self-pay | Admitting: Family Medicine

## 2011-06-07 DIAGNOSIS — E785 Hyperlipidemia, unspecified: Secondary | ICD-10-CM

## 2011-06-07 DIAGNOSIS — E119 Type 2 diabetes mellitus without complications: Secondary | ICD-10-CM

## 2011-06-07 DIAGNOSIS — Z125 Encounter for screening for malignant neoplasm of prostate: Secondary | ICD-10-CM

## 2011-06-07 DIAGNOSIS — Z0000 Encounter for general adult medical examination without abnormal findings: Secondary | ICD-10-CM

## 2011-06-07 LAB — LIPID PANEL
Cholesterol: 120 mg/dL (ref 0–200)
HDL: 39.2 mg/dL (ref >39.00)
LDL Cholesterol: 59 mg/dL (ref 0–99)
Total CHOL/HDL Ratio: 3
Triglycerides: 110 mg/dL (ref 0.0–149.0)
VLDL: 22 mg/dL (ref 0.0–40.0)

## 2011-06-07 LAB — CBC WITH DIFFERENTIAL/PLATELET
Basophils Absolute: 0 10*3/uL (ref 0.0–0.1)
Basophils Relative: 0.3 % (ref 0.0–3.0)
Eosinophils Absolute: 0.3 10*3/uL (ref 0.0–0.7)
Eosinophils Relative: 3.6 % (ref 0.0–5.0)
HCT: 41.4 % (ref 39.0–52.0)
Hemoglobin: 13.8 g/dL (ref 13.0–17.0)
Lymphocytes Relative: 33.1 % (ref 12.0–46.0)
Lymphs Abs: 2.7 10*3/uL (ref 0.7–4.0)
MCHC: 33.3 g/dL (ref 30.0–36.0)
MCV: 83 fl (ref 78.0–100.0)
Monocytes Absolute: 0.5 10*3/uL (ref 0.1–1.0)
Monocytes Relative: 6.5 % (ref 3.0–12.0)
Neutro Abs: 4.6 10*3/uL (ref 1.4–7.7)
Neutrophils Relative %: 56.5 % (ref 43.0–77.0)
Platelets: 277 10*3/uL (ref 150.0–400.0)
RBC: 4.99 Mil/uL (ref 4.22–5.81)
RDW: 15.3 % — ABNORMAL HIGH (ref 11.5–14.6)
WBC: 8.1 10*3/uL (ref 4.5–10.5)

## 2011-06-07 LAB — HEPATIC FUNCTION PANEL
ALT: 20 U/L (ref 0–53)
AST: 25 U/L (ref 0–37)
Albumin: 4.3 g/dL (ref 3.5–5.2)
Alkaline Phosphatase: 86 U/L (ref 39–117)
Bilirubin, Direct: 0.3 mg/dL (ref 0.0–0.3)
Total Bilirubin: 1.3 mg/dL — ABNORMAL HIGH (ref 0.3–1.2)
Total Protein: 7.2 g/dL (ref 6.0–8.3)

## 2011-06-07 LAB — BASIC METABOLIC PANEL
BUN: 19 mg/dL (ref 6–23)
CO2: 27 mEq/L (ref 19–32)
Calcium: 8.9 mg/dL (ref 8.4–10.5)
Chloride: 102 mEq/L (ref 96–112)
Creatinine, Ser: 1 mg/dL (ref 0.4–1.5)
GFR: 84.08 mL/min (ref >60.00)
Glucose, Bld: 140 mg/dL — ABNORMAL HIGH (ref 70–99)
Potassium: 4.6 mEq/L (ref 3.5–5.1)
Sodium: 136 mEq/L (ref 135–145)

## 2011-06-07 LAB — PSA: PSA: 0.71 ng/mL (ref 0.10–4.00)

## 2011-06-07 LAB — TSH: TSH: 2.18 u[IU]/mL (ref 0.35–5.50)

## 2011-06-07 LAB — HEMOGLOBIN A1C: Hgb A1c MFr Bld: 6.7 % — ABNORMAL HIGH (ref 4.6–6.5)

## 2011-06-07 NOTE — Progress Notes (Signed)
  Subjective:    Patient ID: Ryan Zhang, male    DOB: Jul 09, 1946, 65 y.o.   MRN: [id number]  HPI Here today for CPE.  Risk Factors: DM- chronic problem for pt.  UTD on eye exam.  Doing well on Actoplus Met.  Denies symptomatic lows but not checking sugars. Hyperlipidemia- chronic problem for pt.  LDL goal is <70 due to DM.  Tolerating Zocor w/out difficulty. Physical Activity: mows lawn, no formal exercise. Fall Risk: low risk Depression: hx of bipolar disorder, managed by psych Hearing: has hearing aides, not wearing them ADL's: independent Cognitive: normal linear thought process, no deficits in memory noted. Home Safety: feels safe at home, lives w/ wife and daughter Height, Weight, BMI, Visual Acuity: see vitals, vision corrected to 20/20 w/ glasses Counseling: UTD on colonoscopy, eye exam.  Recommended setting up POA and living will Labs Ordered: See A&P Care Plan: See A&P    Review of Systems Patient reports no  vision/ hearing changes, anorexia, weight change, fever , adenopathy, persistant / recurrent hoarseness, chest pain,palpitations, edema, hemoptysis, dyspnea(rest, exertional, paroxysmal nocturnal), gastrointestinal  bleeding (melena, rectal bleeding), abdominal pain, excessive heart burn, GU symptoms( dysuria, hematuria, pyuria, voiding/incontinence  Issues) syncope, focal weakness, memory loss,numbness & tingling, skin/hair/nail changes, abnormal bruising/bleeding, musculoskeletal symptoms/signs.  + cough, mild difficulty swallowing (seeing GI)     Objective:   Physical Exam BP 130/80  Temp(Src) 98.8 F (37.1 C) (Oral)  Wt 259 lb 6.4 oz (117.663 kg)  General Appearance:    Alert, cooperative, no distress, appears stated age  Head:    Normocephalic, without obvious abnormality, atraumatic  Eyes:    PERRL, conjunctiva/corneas clear, EOM's intact, fundi    benign, both eyes       Ears:    Normal TM's and external ear canals, both ears  Nose:   Nares normal,  septum midline, mucosa normal, no drainage   or sinus tenderness  Throat:   Lips, mucosa, and tongue normal; teeth and gums normal  Neck:   Supple, symmetrical, trachea midline, no adenopathy;       thyroid:  No enlargement/tenderness/nodules  Back:     Symmetric, no curvature, ROM normal, no CVA tenderness  Lungs:     Clear to auscultation bilaterally, respirations unlabored  Chest wall:    No tenderness or deformity  Heart:    Regular rate and rhythm, S1 and S2 normal, no murmur, rub   or gallop  Abdomen:     Soft, non-tender, bowel sounds active all four quadrants,    no masses, no organomegaly  Genitalia:    Normal male without lesion, masses, discharge or tenderness  Rectal:    Normal tone, normal prostate, no masses or tenderness  Extremities:   Extremities normal, atraumatic, no cyanosis or edema  Pulses:   2+ and symmetric all extremities  Skin:   Skin color, texture, turgor normal, no rashes or lesions  Lymph nodes:   Cervical, supraclavicular, and axillary nodes normal  Neurologic:   CNII-XII intact. Normal strength, sensation and no patellar reflexes due to TKR bilaterally          Assessment & Plan:

## 2011-06-07 NOTE — Patient Instructions (Signed)
Follow up in 3 months to recheck DM- you can eat before this appt We'll notify you of your lab results Try and make good food choices and get regular exercise Call with any questions or concerns Hang in there!  You're doing great!

## 2011-06-13 NOTE — Assessment & Plan Note (Signed)
A1C has been stable for awhile on current meds.  Getting yearly eye exams.  Not formally exercising, attempting to follow ADA diet.  Check labs and adjust regimen prn.

## 2011-06-13 NOTE — Assessment & Plan Note (Signed)
Pt's PE WNL.  UTD on health maintenance.  Encouraged him to formalize POA and living will.  Check labs.  Anticipatory guidance provided.

## 2011-06-13 NOTE — Assessment & Plan Note (Signed)
Pt's lipids have been traditionally well controlled.  Check labs and adjust meds prn.

## 2011-07-27 ENCOUNTER — Encounter: Payer: Self-pay | Admitting: Gastroenterology

## 2011-08-12 ENCOUNTER — Encounter: Payer: Self-pay | Admitting: Family Medicine

## 2011-08-12 ENCOUNTER — Ambulatory Visit (INDEPENDENT_AMBULATORY_CARE_PROVIDER_SITE_OTHER): Payer: Medicare Other | Admitting: Family Medicine

## 2011-08-12 VITALS — BP 118/72 | Temp 98.6°F | Wt 258.0 lb

## 2011-08-12 DIAGNOSIS — J069 Acute upper respiratory infection, unspecified: Secondary | ICD-10-CM

## 2011-08-12 DIAGNOSIS — Z23 Encounter for immunization: Secondary | ICD-10-CM

## 2011-08-12 DIAGNOSIS — B372 Candidiasis of skin and nail: Secondary | ICD-10-CM | POA: Insufficient documentation

## 2011-08-12 MED ORDER — NYSTATIN-TRIAMCINOLONE 100000-0.1 UNIT/GM-% EX OINT: TOPICAL_OINTMENT | Freq: Two times a day (BID) | CUTANEOUS | Status: DC

## 2011-08-12 MED ORDER — AZITHROMYCIN 250 MG PO TABS: 250.0000 mg | ORAL_TABLET | Freq: Every day | ORAL | Status: AC

## 2011-08-12 NOTE — Progress Notes (Signed)
  Subjective:    Patient ID: Ryan Zhang, male    DOB: 01/03/1946, 65 y.o.   MRN: [id number]  HPI Cough- sxs started 3 days ago w/ runny nose, chest congestion, 'scratchy voice', nasal congestion.  + cough, wet but non-productive.  Denies facial pain, ear pain, fevers.  No sick contacts.  Pt reports wheezing last night.  Crotch rash- sxs started 2 months ago, using Lotrimin spray w/out relief.  'it stings'.  Reports rash will feel wet and 'it stinks'.  'smells like a fish'.   Review of Systems For ROS see HPI     Objective:   Physical Exam  Vitals reviewed. Constitutional: He appears well-developed and well-nourished. No distress.  HENT:  Head: Normocephalic and atraumatic.  Right Ear: Tympanic membrane normal.  Left Ear: Tympanic membrane normal.  Nose: No mucosal edema or rhinorrhea. Right sinus exhibits no maxillary sinus tenderness and no frontal sinus tenderness. Left sinus exhibits no maxillary sinus tenderness and no frontal sinus tenderness.  Mouth/Throat: Mucous membranes are normal. No oropharyngeal exudate, posterior oropharyngeal edema or posterior oropharyngeal erythema.  Eyes: Conjunctivae and EOM are normal. Pupils are equal, round, and reactive to light.  Neck: Normal range of motion. Neck supple.  Cardiovascular: Normal rate, regular rhythm and normal heart sounds.   Pulmonary/Chest: Effort normal and breath sounds normal. No respiratory distress. He has no wheezes.       + hacking cough  Lymphadenopathy:    He has no cervical adenopathy.  Skin: Rash (groin rash consistent w/ yeast) noted.          Assessment & Plan:

## 2011-08-12 NOTE — Assessment & Plan Note (Signed)
Start steroid/antifungal combo cream bid.  Pt expressed understanding and is in agreement w/ plan.

## 2011-08-12 NOTE — Assessment & Plan Note (Signed)
Pt's sxs are most likely viral but w/ the weekend upcoming, will send script for Zpack for pt to pick up if sxs worsen.  Reviewed supportive care and red flags that should prompt return.  Pt expressed understanding and is in agreement w/ plan.

## 2011-08-12 NOTE — Patient Instructions (Signed)
Use the Triamcinolone ointment twice daily for the rash Make sure you keep the area clean and dry Pick up the Azithromycin if the cough worsens or doesn't improve Use the Tessalon as needed for cough Add Mucinex to thin your congestion Call with any questions or concerns Hang in there!

## 2011-09-05 ENCOUNTER — Other Ambulatory Visit: Payer: Self-pay | Admitting: Family Medicine

## 2011-09-07 ENCOUNTER — Ambulatory Visit: Payer: Medicare Other | Admitting: Family Medicine

## 2011-09-08 ENCOUNTER — Ambulatory Visit (INDEPENDENT_AMBULATORY_CARE_PROVIDER_SITE_OTHER): Payer: Medicare Other | Admitting: Family Medicine

## 2011-09-08 ENCOUNTER — Encounter: Payer: Self-pay | Admitting: Family Medicine

## 2011-09-08 VITALS — BP 122/70 | HR 84 | Temp 97.8°F | Ht 70.25 in | Wt 255.0 lb

## 2011-09-08 DIAGNOSIS — E119 Type 2 diabetes mellitus without complications: Secondary | ICD-10-CM

## 2011-09-08 LAB — BASIC METABOLIC PANEL
BUN: 26 mg/dL — ABNORMAL HIGH (ref 6–23)
CO2: 27 mEq/L (ref 19–32)
Calcium: 9.2 mg/dL (ref 8.4–10.5)
Chloride: 105 mEq/L (ref 96–112)
Creatinine, Ser: 1 mg/dL (ref 0.4–1.5)
GFR: 77.46 mL/min (ref >60.00)
Glucose, Bld: 115 mg/dL — ABNORMAL HIGH (ref 70–99)
Potassium: 4.9 mEq/L (ref 3.5–5.1)
Sodium: 139 mEq/L (ref 135–145)

## 2011-09-08 LAB — HEMOGLOBIN A1C: Hgb A1c MFr Bld: 6.3 % (ref 4.6–6.5)

## 2011-09-08 NOTE — Assessment & Plan Note (Signed)
Chronic problem for pt.  Typically well controlled.  Check labs.  Adjust meds prn.  Asymptomatic.

## 2011-09-08 NOTE — Progress Notes (Signed)
  Subjective:    Patient ID: Ryan Zhang, male    DOB: 06/03/1946, 65 y.o.   MRN: [id number]  HPI DM- chronic problem for pt, on Actoplus Met.  Typically well controlled.  Not checking sugars.  Denies symptomatic lows.  Denies CP, SOB, HAs, visual changes, edema.  UTD on exam.   Review of Systems For ROS see HPI     Objective:   Physical Exam  Vitals reviewed. Constitutional: He appears well-developed and well-nourished. No distress.  HENT:  Head: Normocephalic and atraumatic.  Mouth/Throat: Mucous membranes are normal.  Eyes: Conjunctivae and EOM are normal. Pupils are equal, round, and reactive to light.  Neck: Normal range of motion. Neck supple. No thyromegaly present.  Cardiovascular: Normal rate, regular rhythm, normal heart sounds and intact distal pulses.   Pulmonary/Chest: Effort normal and breath sounds normal. No respiratory distress. He has no wheezes.  Abdominal: Soft. Bowel sounds are normal. He exhibits no distension. There is no tenderness.  Musculoskeletal: He exhibits no edema.  Skin: Skin is warm and dry.  Psychiatric: He has a normal mood and affect. His behavior is normal.          Assessment & Plan:

## 2011-09-08 NOTE — Patient Instructions (Signed)
Follow up in 3 months to recheck diabetes and cholesterol- don't eat before this one We'll notify you of your lab results and make any changes if needed Call with any questions or concerns Hang in there!!! Happy Holidays!!!

## 2011-10-20 ENCOUNTER — Encounter: Payer: Self-pay | Admitting: Family Medicine

## 2011-10-20 ENCOUNTER — Ambulatory Visit (INDEPENDENT_AMBULATORY_CARE_PROVIDER_SITE_OTHER): Payer: Medicare Other | Admitting: Family Medicine

## 2011-10-20 DIAGNOSIS — J40 Bronchitis, not specified as acute or chronic: Secondary | ICD-10-CM

## 2011-10-20 NOTE — Progress Notes (Signed)
  Subjective:    Patient ID: Ryan Zhang, male    DOB: 1945-12-30, 65 y.o.   MRN: [id number]  HPI Cough- sxs started Saturday as a 'cold'.  Thought he was getting better but then again felt bad this AM.  + hoarseness.  Minimal nasal congestion.  No ear pain.  No facial pain.  Cough is dry.  Used Symbicort this AM w/ some relief.  No known sick contacts.  No fevers.   Review of Systems For ROS see HPI     Objective:   Physical Exam  Vitals reviewed. Constitutional: He appears well-developed and well-nourished. No distress.  HENT:  Head: Normocephalic and atraumatic.  Right Ear: Tympanic membrane normal.  Left Ear: Tympanic membrane normal.  Nose: No mucosal edema or rhinorrhea. Right sinus exhibits no maxillary sinus tenderness and no frontal sinus tenderness. Left sinus exhibits no maxillary sinus tenderness and no frontal sinus tenderness.  Mouth/Throat: Mucous membranes are normal. No oropharyngeal exudate, posterior oropharyngeal edema or posterior oropharyngeal erythema.  Eyes: Conjunctivae and EOM are normal. Pupils are equal, round, and reactive to light.  Neck: Normal range of motion. Neck supple.  Cardiovascular: Normal rate, regular rhythm and normal heart sounds.   Pulmonary/Chest: Effort normal and breath sounds normal. No respiratory distress. He has no wheezes.       + hacking cough  Lymphadenopathy:    He has no cervical adenopathy.  Skin: Skin is warm and dry.          Assessment & Plan:

## 2011-10-20 NOTE — Patient Instructions (Signed)
This is a bronchitis Take the Azithromycin as directed Increase the Symbicort to 2 puffs twice daily while not feeling well Use your albuterol as needed Add Delsym for cough REST! Hang in there! Happy Holidays!

## 2011-10-26 NOTE — Assessment & Plan Note (Signed)
Pt has hx of underlying lung dz.  Based on this, will start abx.  Reviewed supportive care and red flags that should prompt return.  Pt expressed understanding and is in agreement w/ plan.

## 2011-11-26 ENCOUNTER — Emergency Department (HOSPITAL_COMMUNITY)
Admission: EM | Admit: 2011-11-26 | Discharge: 2011-11-26 | Disposition: A | Discharge status: Home / Self Care | Payer: Medicare Other | Attending: Emergency Medicine | Admitting: Emergency Medicine

## 2011-11-26 ENCOUNTER — Emergency Department (HOSPITAL_COMMUNITY): Payer: Medicare Other

## 2011-11-26 DIAGNOSIS — I10 Essential (primary) hypertension: Secondary | ICD-10-CM | POA: Insufficient documentation

## 2011-11-26 DIAGNOSIS — R071 Chest pain on breathing: Secondary | ICD-10-CM | POA: Insufficient documentation

## 2011-11-26 DIAGNOSIS — Z79899 Other long term (current) drug therapy: Secondary | ICD-10-CM | POA: Insufficient documentation

## 2011-11-26 DIAGNOSIS — H8109 Meniere's disease, unspecified ear: Secondary | ICD-10-CM | POA: Insufficient documentation

## 2011-11-26 DIAGNOSIS — T1490XA Injury, unspecified, initial encounter: Secondary | ICD-10-CM | POA: Insufficient documentation

## 2011-11-26 DIAGNOSIS — R0789 Other chest pain: Secondary | ICD-10-CM

## 2011-11-26 DIAGNOSIS — R079 Chest pain, unspecified: Secondary | ICD-10-CM | POA: Insufficient documentation

## 2011-11-26 DIAGNOSIS — E119 Type 2 diabetes mellitus without complications: Secondary | ICD-10-CM | POA: Insufficient documentation

## 2011-11-26 DIAGNOSIS — Y92410 Unspecified street and highway as the place of occurrence of the external cause: Secondary | ICD-10-CM | POA: Insufficient documentation

## 2011-11-26 MED ORDER — ACETAMINOPHEN-CODEINE #3 300-30 MG PO TABS: 1.0000 | ORAL_TABLET | Freq: Four times a day (QID) | ORAL | Status: DC | PRN
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
## 2011-11-27 NOTE — ED Provider Notes (Signed)
Medical screening examination/treatment/procedure(s) were conducted as a shared visit with non-physician practitioner(s) and myself.  I personally evaluated the patient during the encounter  Doug Sou, MD 11/27/11 0028

## 2011-12-06 ENCOUNTER — Telehealth: Payer: Self-pay | Admitting: Family Medicine

## 2011-12-06 MED ORDER — PIOGLITAZONE HCL-METFORMIN HCL 15-850 MG PO TABS: 1.0000 | ORAL_TABLET | Freq: Two times a day (BID) | ORAL | Status: DC

## 2011-12-06 NOTE — Telephone Encounter (Signed)
Refill- pioglitazone-metformin 15-850. Take one tablet by mouth two times daily with a meal. Qty 60 last fill 12.27.12

## 2011-12-06 NOTE — Telephone Encounter (Signed)
rx sent to pharmacy by e-script  

## 2011-12-07 ENCOUNTER — Ambulatory Visit (INDEPENDENT_AMBULATORY_CARE_PROVIDER_SITE_OTHER): Payer: Medicare Other | Admitting: Family Medicine

## 2011-12-07 ENCOUNTER — Encounter: Payer: Self-pay | Admitting: Family Medicine

## 2011-12-07 DIAGNOSIS — M25569 Pain in unspecified knee: Secondary | ICD-10-CM

## 2011-12-07 DIAGNOSIS — R55 Syncope and collapse: Secondary | ICD-10-CM

## 2011-12-07 DIAGNOSIS — R0789 Other chest pain: Secondary | ICD-10-CM | POA: Insufficient documentation

## 2011-12-07 MED ORDER — HYDROCODONE-ACETAMINOPHEN 5-500 MG PO TABS: 1.0000 | ORAL_TABLET | ORAL | Status: DC | PRN

## 2011-12-07 NOTE — Patient Instructions (Addendum)
We'll notify you of your lab and imaging results Someone will call you with your Neuro appt Use the Vicodin as needed for pain relief- don't take extra tylenol ICE or HEAT NO DRIVING! Call with any questions or concerns Hang in there!
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
## 2011-12-08 ENCOUNTER — Encounter: Payer: Self-pay | Admitting: *Deleted

## 2011-12-08 LAB — CBC WITH DIFFERENTIAL/PLATELET
Basophils Absolute: 0 10*3/uL (ref 0.0–0.1)
Basophils Relative: 0.2 % (ref 0.0–3.0)
Eosinophils Absolute: 0.1 10*3/uL (ref 0.0–0.7)
Eosinophils Relative: 1.1 % (ref 0.0–5.0)
HCT: 40.1 % (ref 39.0–52.0)
Hemoglobin: 13.5 g/dL (ref 13.0–17.0)
Lymphocytes Relative: 23.2 % (ref 12.0–46.0)
Lymphs Abs: 1.8 10*3/uL (ref 0.7–4.0)
MCHC: 33.6 g/dL (ref 30.0–36.0)
MCV: 83.7 fl (ref 78.0–100.0)
Monocytes Absolute: 0.4 10*3/uL (ref 0.1–1.0)
Monocytes Relative: 5.5 % (ref 3.0–12.0)
Neutro Abs: 5.4 10*3/uL (ref 1.4–7.7)
Neutrophils Relative %: 70 % (ref 43.0–77.0)
Platelets: 277 10*3/uL (ref 150.0–400.0)
RBC: 4.79 Mil/uL (ref 4.22–5.81)
RDW: 14.8 % — ABNORMAL HIGH (ref 11.5–14.6)
WBC: 7.7 10*3/uL (ref 4.5–10.5)

## 2011-12-08 LAB — BASIC METABOLIC PANEL
BUN: 30 mg/dL — ABNORMAL HIGH (ref 6–23)
CO2: 27 mEq/L (ref 19–32)
Calcium: 9 mg/dL (ref 8.4–10.5)
Chloride: 104 mEq/L (ref 96–112)
Creatinine, Ser: 1.2 mg/dL (ref 0.4–1.5)
GFR: 67.47 mL/min (ref >60.00)
Glucose, Bld: 105 mg/dL — ABNORMAL HIGH (ref 70–99)
Potassium: 4.4 mEq/L (ref 3.5–5.1)
Sodium: 139 mEq/L (ref 135–145)

## 2011-12-08 LAB — HEPATIC FUNCTION PANEL
ALT: 15 U/L (ref 0–53)
AST: 19 U/L (ref 0–37)
Albumin: 4.1 g/dL (ref 3.5–5.2)
Alkaline Phosphatase: 77 U/L (ref 39–117)
Bilirubin, Direct: 0.3 mg/dL (ref 0.0–0.3)
Total Bilirubin: 1.5 mg/dL — ABNORMAL HIGH (ref 0.3–1.2)
Total Protein: 7 g/dL (ref 6.0–8.3)

## 2011-12-09 ENCOUNTER — Ambulatory Visit (HOSPITAL_COMMUNITY): Admission: RE | Admit: 2011-12-09 | Payer: Medicare Other | Source: Ambulatory Visit

## 2011-12-09 ENCOUNTER — Ambulatory Visit (HOSPITAL_COMMUNITY)
Admission: RE | Admit: 2011-12-09 | Discharge: 2011-12-09 | Disposition: A | Discharge status: Home / Self Care | Payer: Medicare Other | Source: Ambulatory Visit | Attending: Family Medicine | Admitting: Family Medicine

## 2011-12-09 DIAGNOSIS — R55 Syncope and collapse: Secondary | ICD-10-CM

## 2011-12-09 DIAGNOSIS — I658 Occlusion and stenosis of other precerebral arteries: Secondary | ICD-10-CM | POA: Insufficient documentation

## 2011-12-09 DIAGNOSIS — Z86718 Personal history of other venous thrombosis and embolism: Secondary | ICD-10-CM | POA: Insufficient documentation

## 2011-12-09 DIAGNOSIS — I6509 Occlusion and stenosis of unspecified vertebral artery: Secondary | ICD-10-CM | POA: Insufficient documentation

## 2011-12-09 DIAGNOSIS — I771 Stricture of artery: Secondary | ICD-10-CM | POA: Insufficient documentation

## 2011-12-09 DIAGNOSIS — I6529 Occlusion and stenosis of unspecified carotid artery: Secondary | ICD-10-CM | POA: Insufficient documentation

## 2011-12-09 MED ORDER — GADOBENATE DIMEGLUMINE 529 MG/ML IV SOLN
20.0000 mL | Freq: Once | INTRAVENOUS | Status: AC | PRN
  Administered 2011-12-09: 20 mL via INTRAVENOUS

## 2011-12-10 ENCOUNTER — Encounter: Payer: Self-pay | Admitting: *Deleted
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
## 2011-12-14 NOTE — Assessment & Plan Note (Signed)
No evidence of problem w/ artificial knee, bruising is already improving.  Reviewed supportive care.  Pt expressed understanding and is in agreement w/ plan.
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
## 2011-12-15 ENCOUNTER — Encounter: Payer: Self-pay | Admitting: Family Medicine

## 2011-12-15 ENCOUNTER — Ambulatory Visit (INDEPENDENT_AMBULATORY_CARE_PROVIDER_SITE_OTHER): Payer: Medicare Other | Admitting: Neurology

## 2011-12-15 ENCOUNTER — Encounter: Payer: Self-pay | Admitting: Neurology

## 2011-12-15 ENCOUNTER — Telehealth: Payer: Self-pay | Admitting: *Deleted

## 2011-12-15 VITALS — BP 114/70 | HR 90 | Ht 69.0 in | Wt 260.0 lb

## 2011-12-15 DIAGNOSIS — R55 Syncope and collapse: Secondary | ICD-10-CM

## 2011-12-15 MED ORDER — CLOPIDOGREL BISULFATE 75 MG PO TABS: 75.0000 mg | ORAL_TABLET | Freq: Every day | ORAL | Status: DC
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
## 2011-12-15 NOTE — Patient Instructions (Signed)
Your CTA is scheduled at [facility] on Friday, February 1st at 12 noon. Please go to the first floor radiology department 15 minutes prior to your appointment. Nothing to eat or drink 4 hours prior to your test.  [phone].   Your new prescription has been sent to your pharmacy.  Your next appointment with Dr. Modesto Charon is scheduled on April 1st at 10:30 am.  Your will be having Vestibular/Physical Therapy at [facility] located at [address] in [place]. They will call you to schedule an appointment.  [phone].

## 2011-12-15 NOTE — Telephone Encounter (Signed)
Pt called to advise he was having problems getting into [facility], assisted pt with logging on, pt advised he logged on and if he has any more questions he will give [place] a call, advised of the number for [facility] assistance. Pt understood

## 2011-12-16 ENCOUNTER — Telehealth: Payer: Self-pay | Admitting: Family Medicine

## 2011-12-16 MED ORDER — HYDROCODONE-ACETAMINOPHEN 5-500 MG PO TABS: 1.0000 | ORAL_TABLET | ORAL | Status: DC | PRN

## 2011-12-16 NOTE — Telephone Encounter (Signed)
Was given verbal order from MD Tabori to prescribe pt #20 tablets, faxed rx to pharmacy manually

## 2011-12-16 NOTE — Telephone Encounter (Signed)
Assisted pt via phone how to log into MYchart. Pt understood and will call back with any concerns
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
## 2011-12-17 ENCOUNTER — Ambulatory Visit (HOSPITAL_COMMUNITY)
Admission: RE | Admit: 2011-12-17 | Discharge: 2011-12-17 | Disposition: A | Discharge status: Home / Self Care | Payer: Medicare Other | Source: Ambulatory Visit | Attending: Neurology | Admitting: Neurology

## 2011-12-17 DIAGNOSIS — R55 Syncope and collapse: Secondary | ICD-10-CM | POA: Insufficient documentation

## 2011-12-17 DIAGNOSIS — I6529 Occlusion and stenosis of unspecified carotid artery: Secondary | ICD-10-CM | POA: Insufficient documentation

## 2011-12-17 MED ORDER — IOHEXOL 350 MG/ML SOLN: 50.0000 mL | Freq: Once | INTRAVENOUS | Status: AC | PRN

## 2011-12-20 ENCOUNTER — Encounter: Payer: Self-pay | Admitting: Family Medicine

## 2011-12-20 ENCOUNTER — Ambulatory Visit (INDEPENDENT_AMBULATORY_CARE_PROVIDER_SITE_OTHER): Payer: Medicare Other | Admitting: Family Medicine

## 2011-12-20 ENCOUNTER — Other Ambulatory Visit: Payer: Self-pay | Admitting: Family Medicine

## 2011-12-20 DIAGNOSIS — E785 Hyperlipidemia, unspecified: Secondary | ICD-10-CM

## 2011-12-20 DIAGNOSIS — R1310 Dysphagia, unspecified: Secondary | ICD-10-CM

## 2011-12-20 DIAGNOSIS — E119 Type 2 diabetes mellitus without complications: Secondary | ICD-10-CM

## 2011-12-20 DIAGNOSIS — M25519 Pain in unspecified shoulder: Secondary | ICD-10-CM

## 2011-12-20 DIAGNOSIS — M25512 Pain in left shoulder: Secondary | ICD-10-CM | POA: Insufficient documentation

## 2011-12-20 LAB — LIPID PANEL
Cholesterol: 143 mg/dL (ref 0–200)
HDL: 41.9 mg/dL (ref >39.00)
LDL Cholesterol: 81 mg/dL (ref 0–99)
Total CHOL/HDL Ratio: 3
Triglycerides: 103 mg/dL (ref 0.0–149.0)
VLDL: 20.6 mg/dL (ref 0.0–40.0)

## 2011-12-20 LAB — BASIC METABOLIC PANEL
BUN: 16 mg/dL (ref 6–23)
CO2: 28 mEq/L (ref 19–32)
Calcium: 9.1 mg/dL (ref 8.4–10.5)
Chloride: 102 mEq/L (ref 96–112)
Creatinine, Ser: 1 mg/dL (ref 0.4–1.5)
GFR: 82.94 mL/min (ref >60.00)
Glucose, Bld: 132 mg/dL — ABNORMAL HIGH (ref 70–99)
Potassium: 5 mEq/L (ref 3.5–5.1)
Sodium: 137 mEq/L (ref 135–145)

## 2011-12-20 LAB — HEPATIC FUNCTION PANEL
ALT: 22 U/L (ref 0–53)
AST: 22 U/L (ref 0–37)
Albumin: 4 g/dL (ref 3.5–5.2)
Alkaline Phosphatase: 97 U/L (ref 39–117)
Bilirubin, Direct: 0.1 mg/dL (ref 0.0–0.3)
Total Bilirubin: 1.1 mg/dL (ref 0.3–1.2)
Total Protein: 7.1 g/dL (ref 6.0–8.3)

## 2011-12-20 LAB — HEMOGLOBIN A1C: Hgb A1c MFr Bld: 6.1 % (ref 4.6–6.5)

## 2011-12-20 MED ORDER — ENALAPRIL MALEATE 2.5 MG PO TABS: 2.5000 mg | ORAL_TABLET | Freq: Every day | ORAL | Status: DC

## 2011-12-20 MED ORDER — SIMVASTATIN 40 MG PO TABS: 40.0000 mg | ORAL_TABLET | Freq: Every day | ORAL | Status: DC
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
## 2011-12-20 NOTE — Telephone Encounter (Signed)
rx sent to pharmacy by e-script  

## 2011-12-20 NOTE — Patient Instructions (Signed)
We'll notify you of your lab results and make any changes if needed Someone will call you with your GI, speech, and ortho appts Call with any questions or concerns Hang in there!!!

## 2011-12-20 NOTE — Assessment & Plan Note (Signed)
Not checking sugars at home.  No symptomatic lows.  UTD on eye exam.  Check labs.  Adjust meds prn

## 2011-12-20 NOTE — Assessment & Plan Note (Signed)
New.  Severe.  No relief w/ hydrocodone.  Will refer to ortho for evaluation and tx.  Pt expressed understanding and is in agreement w/ plan.

## 2011-12-20 NOTE — Telephone Encounter (Signed)
Pt wife called back to report that Rx were still not at pharmacy.

## 2011-12-20 NOTE — Assessment & Plan Note (Signed)
Chronic problem.  Tolerating statin w/out difficulty.  Check labs.  Adjust meds prn  

## 2011-12-20 NOTE — Assessment & Plan Note (Signed)
Pt reports recurrent sxs.  Previously had stricture dilatation.  Dr Modesto Charon concerned that swallowing difficulty is neurologic.  Will refer back to GI and proceed w/ speech tx swallow eval.  Reviewed supportive care and red flags that should prompt return.  Pt expressed understanding and is in agreement w/ plan.

## 2011-12-23 ENCOUNTER — Ambulatory Visit: Payer: Medicare Other | Attending: Neurology | Admitting: Physical Therapy

## 2011-12-23 DIAGNOSIS — R262 Difficulty in walking, not elsewhere classified: Secondary | ICD-10-CM | POA: Insufficient documentation

## 2011-12-23 DIAGNOSIS — IMO0001 Reserved for inherently not codable concepts without codable children: Secondary | ICD-10-CM | POA: Insufficient documentation

## 2011-12-24 ENCOUNTER — Encounter: Payer: Self-pay | Admitting: Family Medicine

## 2011-12-24 ENCOUNTER — Other Ambulatory Visit: Payer: Self-pay

## 2011-12-24 MED ORDER — HYDROCODONE-ACETAMINOPHEN 5-500 MG PO TABS: 1.0000 | ORAL_TABLET | ORAL | Status: DC | PRN

## 2011-12-24 NOTE — Telephone Encounter (Signed)
Was given verbal order from MD Tabori to give pt #45 no refills to last pt til he can be seen by Ortho. .rx faxed to pharmacy, manually.

## 2011-12-24 NOTE — Telephone Encounter (Signed)
Call from patient and he stated he is still in pain and does not see Ortho until 12/29/11 and wants to know if he could get a refill on the Hydrocodone.   Please advise    KP

## 2011-12-24 NOTE — Telephone Encounter (Signed)
Agree 

## 2011-12-28 ENCOUNTER — Ambulatory Visit: Payer: Medicare Other | Admitting: Rehabilitative and Restorative Service Providers"

## 2011-12-31 ENCOUNTER — Ambulatory Visit: Payer: Medicare Other | Admitting: Physical Therapy

## 2012-01-03 ENCOUNTER — Other Ambulatory Visit: Payer: Self-pay | Admitting: Family Medicine

## 2012-01-03 ENCOUNTER — Ambulatory Visit: Payer: Medicare Other | Admitting: Physical Therapy

## 2012-01-03 MED ORDER — NYSTATIN-TRIAMCINOLONE 100000-0.1 UNIT/GM-% EX OINT: TOPICAL_OINTMENT | Freq: Two times a day (BID) | CUTANEOUS | Status: AC

## 2012-01-03 NOTE — Telephone Encounter (Signed)
rx sent to pharmacy by e-script  

## 2012-01-05 ENCOUNTER — Encounter: Payer: Self-pay | Admitting: *Deleted

## 2012-01-06 ENCOUNTER — Ambulatory Visit: Payer: Medicare Other | Admitting: Physical Therapy

## 2012-01-07 ENCOUNTER — Other Ambulatory Visit (HOSPITAL_COMMUNITY): Payer: Self-pay | Admitting: Gastroenterology

## 2012-01-07 ENCOUNTER — Encounter: Payer: Self-pay | Admitting: Gastroenterology

## 2012-01-07 ENCOUNTER — Ambulatory Visit (INDEPENDENT_AMBULATORY_CARE_PROVIDER_SITE_OTHER): Payer: Medicare Other | Admitting: Gastroenterology

## 2012-01-07 DIAGNOSIS — Z760 Encounter for issue of repeat prescription: Secondary | ICD-10-CM

## 2012-01-07 DIAGNOSIS — R1310 Dysphagia, unspecified: Secondary | ICD-10-CM

## 2012-01-07 DIAGNOSIS — E669 Obesity, unspecified: Secondary | ICD-10-CM

## 2012-01-07 DIAGNOSIS — Z79899 Other long term (current) drug therapy: Secondary | ICD-10-CM

## 2012-01-07 DIAGNOSIS — Z8719 Personal history of other diseases of the digestive system: Secondary | ICD-10-CM

## 2012-01-07 DIAGNOSIS — E66812 Obesity, class 2: Secondary | ICD-10-CM

## 2012-01-07 DIAGNOSIS — R1319 Other dysphagia: Secondary | ICD-10-CM

## 2012-01-07 NOTE — Progress Notes (Signed)
This is a very complicated 66 year old Caucasian male with multiple medical problems who has had chronic recurrent dysphagia for many years. Previous esophageal manometry in June of 2012 they're nonspecific esophageal motility disorder without evidence of achalasia. He has a vague neurological disorder followed by Dr. Modesto Charon neurology. He currently describes dysphagia for solids and liquids mostly with small particle foods such as crackers and rice. If he is careful with his diet he does well. Associated with these spells of dysphagia or severe coughing and flares of his asthma. Reviewing his records I cannot see a previous endoscopic exam. The patient is on multiple different medications listed and reviewed which include multiple psychotropic medications with sedatives, and sleeping agents.  Current Medications, Allergies, Past Medical History, Past Surgical History, Family History and Social History were reviewed in [employer] record.  Pertinent Review of Systems Negative  Physical Exam: Elderly appearing patient in no acute distress. Blood pressure 128/72 and pulse 88 and regular. BMI elevated at 38.04 and appreciated stigmata of chronic liver disease. He has a very thick large tongue making examination oropharynx very difficult. I cannot appreciate thyromegaly, lymphadenopathy, or other abnormalities in his neck although he does have a very short thick neck.    Assessment and Plan: It certainly sounds like this patient has neurogenic dysphagia, and previous manometry showed evidence of a nonspecific motility disturbance in the esophageal body. It was not felt that Botox injections would be of benefit.. It sounds like he has" transfer dysphasia" with discoordination between pharyngeal contraction and cricopharyngeal relaxation. As far as I can ascertain, he does not have any history of myasthenia, polymyalgia, multi-infarct dementia, or other diagnosed neurological processes. I  have scheduled speech pathology evaluation including barium swallow, and will proceed accordingly. I suspect he will do well with a modified diet plan. He does have some chronic GERD symptoms and is on Nexium 40 mg a day. Depending on the barium swallow results, we may need to proceed with endoscopic exam also. This will be a difficult exam for this patient with his anatomy and multiple medications. He may need further neurological testing to exclude myasthenia or other neuromuscular neurological processes.  Please copy her primary care physician, referring physician, and pertinent subspecialists.Marland KitchenMarland KitchenAlso Dr. Modesto Charon at [facility]. Encounter Diagnosis  Name Primary?  [name] Dysphagia Yes

## 2012-01-07 NOTE — Patient Instructions (Signed)
We will call you with an appointment for your Modified Barium Swallow.

## 2012-01-11 ENCOUNTER — Telehealth: Payer: Self-pay | Admitting: Family Medicine

## 2012-01-11 ENCOUNTER — Ambulatory Visit: Payer: Medicare Other | Admitting: Physical Therapy

## 2012-01-11 MED ORDER — PIOGLITAZONE HCL-METFORMIN HCL 15-850 MG PO TABS: 1.0000 | ORAL_TABLET | Freq: Two times a day (BID) | ORAL | Status: DC

## 2012-01-11 NOTE — Telephone Encounter (Signed)
Refill: Pioglitazone-Metformin 15-850. Take 1 tablet by mouth 2 times daily with a meal. Qty 60. Last fill 11-11-11

## 2012-01-11 NOTE — Telephone Encounter (Signed)
rx sent to pharmacy by e-script  

## 2012-01-13 ENCOUNTER — Ambulatory Visit: Payer: Medicare Other | Admitting: Physical Therapy

## 2012-01-14 ENCOUNTER — Ambulatory Visit (HOSPITAL_COMMUNITY)
Admission: RE | Admit: 2012-01-14 | Discharge: 2012-01-14 | Disposition: A | Discharge status: Home / Self Care | Payer: Medicare Other | Source: Ambulatory Visit | Attending: Gastroenterology | Admitting: Gastroenterology

## 2012-01-14 DIAGNOSIS — E785 Hyperlipidemia, unspecified: Secondary | ICD-10-CM | POA: Insufficient documentation

## 2012-01-14 DIAGNOSIS — E119 Type 2 diabetes mellitus without complications: Secondary | ICD-10-CM | POA: Insufficient documentation

## 2012-01-14 DIAGNOSIS — J45909 Unspecified asthma, uncomplicated: Secondary | ICD-10-CM | POA: Insufficient documentation

## 2012-01-14 DIAGNOSIS — I10 Essential (primary) hypertension: Secondary | ICD-10-CM | POA: Insufficient documentation

## 2012-01-14 DIAGNOSIS — R1310 Dysphagia, unspecified: Secondary | ICD-10-CM

## 2012-01-14 DIAGNOSIS — K219 Gastro-esophageal reflux disease without esophagitis: Secondary | ICD-10-CM | POA: Insufficient documentation

## 2012-01-14 DIAGNOSIS — Z8673 Personal history of transient ischemic attack (TIA), and cerebral infarction without residual deficits: Secondary | ICD-10-CM | POA: Insufficient documentation
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
## 2012-01-17 ENCOUNTER — Ambulatory Visit: Payer: Medicare Other | Attending: Neurology | Admitting: Physical Therapy

## 2012-01-17 DIAGNOSIS — R262 Difficulty in walking, not elsewhere classified: Secondary | ICD-10-CM | POA: Insufficient documentation

## 2012-01-17 DIAGNOSIS — IMO0001 Reserved for inherently not codable concepts without codable children: Secondary | ICD-10-CM | POA: Insufficient documentation

## 2012-01-21 ENCOUNTER — Ambulatory Visit: Payer: Medicare Other | Admitting: Physical Therapy

## 2012-01-25 ENCOUNTER — Ambulatory Visit: Payer: Medicare Other | Admitting: Physical Therapy

## 2012-01-28 ENCOUNTER — Ambulatory Visit: Payer: Medicare Other | Admitting: Physical Therapy

## 2012-02-01 ENCOUNTER — Ambulatory Visit: Payer: Medicare Other | Admitting: Physical Therapy

## 2012-02-03 ENCOUNTER — Ambulatory Visit: Payer: Medicare Other | Admitting: Physical Therapy

## 2012-02-07 ENCOUNTER — Other Ambulatory Visit: Payer: Self-pay | Admitting: Family Medicine

## 2012-02-07 ENCOUNTER — Ambulatory Visit: Payer: Medicare Other | Admitting: Physical Therapy

## 2012-02-07 MED ORDER — PIOGLITAZONE HCL-METFORMIN HCL 15-850 MG PO TABS: 1.0000 | ORAL_TABLET | Freq: Two times a day (BID) | ORAL | Status: DC

## 2012-02-07 NOTE — Telephone Encounter (Signed)
Refill for  Pioglitazone-Metformin 15-850 Qty 60  Last fill 2.15.13 Take 1-tablet by mouth 2 (two) times daily with meal

## 2012-02-07 NOTE — Telephone Encounter (Signed)
rx sent to pharmacy by e-script  

## 2012-02-10 ENCOUNTER — Ambulatory Visit: Payer: Medicare Other | Admitting: Physical Therapy

## 2012-02-13 NOTE — Progress Notes (Signed)
Received results of swallowing eval. - moderate oral phase dysphagia, moderate pharyngeal phase dysphagia

## 2012-02-14 ENCOUNTER — Ambulatory Visit: Payer: Medicare Other | Attending: Neurology | Admitting: Physical Therapy

## 2012-02-14 ENCOUNTER — Encounter: Payer: Self-pay | Admitting: Neurology

## 2012-02-14 ENCOUNTER — Ambulatory Visit (INDEPENDENT_AMBULATORY_CARE_PROVIDER_SITE_OTHER): Payer: Medicare Other | Admitting: Neurology

## 2012-02-14 ENCOUNTER — Other Ambulatory Visit: Payer: Medicare Other

## 2012-02-14 DIAGNOSIS — IMO0001 Reserved for inherently not codable concepts without codable children: Secondary | ICD-10-CM | POA: Insufficient documentation

## 2012-02-14 DIAGNOSIS — R262 Difficulty in walking, not elsewhere classified: Secondary | ICD-10-CM | POA: Insufficient documentation

## 2012-02-14 DIAGNOSIS — R29818 Other symptoms and signs involving the nervous system: Secondary | ICD-10-CM
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
## 2012-02-14 NOTE — Patient Instructions (Signed)
Go to the basement to have your labs drawn today.  Your EEG is scheduled for Thursday, April 4th at 11:00am.  Please arrive to [facility], first floor admitting by 10:45am.  [phone].

## 2012-02-16 LAB — ACETYLCHOLINE RECEPTOR, BINDING: A CHR BINDING ABS: <0.3 nmol/L (ref <=0.30)

## 2012-02-17 ENCOUNTER — Ambulatory Visit: Payer: Medicare Other | Admitting: Physical Therapy

## 2012-02-17 ENCOUNTER — Ambulatory Visit (HOSPITAL_COMMUNITY)
Admission: RE | Admit: 2012-02-17 | Discharge: 2012-02-17 | Disposition: A | Discharge status: Home / Self Care | Payer: Medicare Other | Source: Ambulatory Visit | Attending: Neurology | Admitting: Neurology

## 2012-02-17 DIAGNOSIS — R569 Unspecified convulsions: Secondary | ICD-10-CM

## 2012-02-17 DIAGNOSIS — H538 Other visual disturbances: Secondary | ICD-10-CM | POA: Insufficient documentation

## 2012-02-17 DIAGNOSIS — Z1389 Encounter for screening for other disorder: Secondary | ICD-10-CM | POA: Insufficient documentation

## 2012-02-18 ENCOUNTER — Telehealth: Payer: Self-pay | Admitting: Neurology

## 2012-02-18 LAB — ACETYLCHOLINE RECEPTOR, MODULATING: Acetylchol Modul Ab: 2 %

## 2012-02-18 NOTE — Telephone Encounter (Signed)
Pt's wife called for blood work results.

## 2012-02-21 NOTE — Telephone Encounter (Signed)
Jan - Let him know they were normal.

## 2012-02-21 NOTE — Telephone Encounter (Signed)
Called and spoke with the patient. Information given as per Dr. Modesto Charon below re: lab work normal. No other questions or concerns.
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
## 2012-02-23 ENCOUNTER — Ambulatory Visit: Payer: Medicare Other | Admitting: Physical Therapy

## 2012-02-24 ENCOUNTER — Telehealth: Payer: Self-pay | Admitting: Neurology

## 2012-02-24 NOTE — Telephone Encounter (Signed)
Received a call from Jasmine December, the patient's wife. She was asking for the results of her husband's blood work and and EEG. I let her know that the lab results were given to her husband on 04/05 on the phone and that they were normal. Also informed her that the EEG was normal. No other concerns voiced at this time.

## 2012-02-25 ENCOUNTER — Ambulatory Visit: Payer: Medicare Other | Admitting: Physical Therapy

## 2012-02-28 ENCOUNTER — Ambulatory Visit: Payer: Medicare Other | Admitting: Physical Therapy

## 2012-03-02 ENCOUNTER — Ambulatory Visit: Payer: Medicare Other | Admitting: Physical Therapy

## 2012-03-04 ENCOUNTER — Other Ambulatory Visit: Payer: Self-pay | Admitting: Gastroenterology

## 2012-03-06 ENCOUNTER — Ambulatory Visit: Payer: Medicare Other | Admitting: Physical Therapy

## 2012-03-07 ENCOUNTER — Ambulatory Visit: Payer: Medicare Other | Admitting: Physical Therapy

## 2012-03-13 ENCOUNTER — Ambulatory Visit: Payer: Medicare Other | Admitting: Physical Therapy

## 2012-03-17 ENCOUNTER — Ambulatory Visit: Payer: Medicare Other | Attending: Neurology | Admitting: Physical Therapy

## 2012-03-17 DIAGNOSIS — R262 Difficulty in walking, not elsewhere classified: Secondary | ICD-10-CM | POA: Insufficient documentation

## 2012-03-17 DIAGNOSIS — IMO0001 Reserved for inherently not codable concepts without codable children: Secondary | ICD-10-CM | POA: Insufficient documentation

## 2012-04-03 ENCOUNTER — Telehealth: Payer: Self-pay | Admitting: Family Medicine

## 2012-04-03 NOTE — Telephone Encounter (Signed)
Refill: pioglitazone-metformin 15-850. Take 1 tablet by mouth 2 times daily with a meal. Qty 60. Last fill 12-31-11

## 2012-04-04 MED ORDER — PIOGLITAZONE HCL-METFORMIN HCL 15-850 MG PO TABS: 1.0000 | ORAL_TABLET | Freq: Two times a day (BID) | ORAL | Status: DC

## 2012-04-04 NOTE — Telephone Encounter (Signed)
rx sent to pharmacy by e-script  

## 2012-04-20 ENCOUNTER — Ambulatory Visit (INDEPENDENT_AMBULATORY_CARE_PROVIDER_SITE_OTHER): Payer: Medicare Other | Admitting: Family Medicine

## 2012-04-20 ENCOUNTER — Encounter: Payer: Self-pay | Admitting: Family Medicine

## 2012-04-20 VITALS — BP 124/75 | HR 87 | Temp 98.2°F | Ht 69.0 in | Wt 262.4 lb

## 2012-04-20 DIAGNOSIS — E119 Type 2 diabetes mellitus without complications: Secondary | ICD-10-CM

## 2012-04-20 DIAGNOSIS — Z23 Encounter for immunization: Secondary | ICD-10-CM

## 2012-04-20 DIAGNOSIS — I10 Essential (primary) hypertension: Secondary | ICD-10-CM

## 2012-04-20 DIAGNOSIS — F410 Panic disorder [episodic paroxysmal anxiety] without agoraphobia: Secondary | ICD-10-CM

## 2012-04-20 DIAGNOSIS — R4589 Other symptoms and signs involving emotional state: Secondary | ICD-10-CM

## 2012-04-20 LAB — BASIC METABOLIC PANEL
BUN: 22 mg/dL (ref 6–23)
CO2: 27 mEq/L (ref 19–32)
Calcium: 9.2 mg/dL (ref 8.4–10.5)
Chloride: 108 mEq/L (ref 96–112)
Creatinine, Ser: 0.9 mg/dL (ref 0.4–1.5)
GFR: 85.91 mL/min (ref >60.00)
Glucose, Bld: 115 mg/dL — ABNORMAL HIGH (ref 70–99)
Potassium: 4.3 mEq/L (ref 3.5–5.1)
Sodium: 146 mEq/L — ABNORMAL HIGH (ref 135–145)

## 2012-04-20 LAB — HEMOGLOBIN A1C: Hgb A1c MFr Bld: 6.2 % (ref 4.6–6.5)
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
## 2012-04-20 NOTE — Patient Instructions (Signed)
Follow up in 3 months to recheck diabetes and cholesterol We'll notify you of your lab results and make any changes if needed Call with any questions or concerns Hang in there!!!

## 2012-04-20 NOTE — Assessment & Plan Note (Signed)
Chronic problem.  Well controlled.  Asymptomatic. 

## 2012-04-20 NOTE — Assessment & Plan Note (Signed)
Chronic problem.  Not checking CBGs.  Rarely exercising.  Under a lot of stress.  UTD on eye exam.  Taking meds regularly.  Due for labs.  Adjust meds prn.  Will follow closely.

## 2012-04-21 ENCOUNTER — Ambulatory Visit (INDEPENDENT_AMBULATORY_CARE_PROVIDER_SITE_OTHER): Payer: Medicare Other | Admitting: Family Medicine

## 2012-04-21 ENCOUNTER — Encounter: Payer: Self-pay | Admitting: Family Medicine

## 2012-04-21 VITALS — BP 125/78 | HR 93 | Temp 98.4°F | Ht 69.0 in | Wt 259.6 lb

## 2012-04-21 DIAGNOSIS — J029 Acute pharyngitis, unspecified: Secondary | ICD-10-CM

## 2012-04-21 DIAGNOSIS — J020 Streptococcal pharyngitis: Secondary | ICD-10-CM | POA: Insufficient documentation

## 2012-04-21 LAB — POCT RAPID STREP A (OFFICE): Rapid Strep A Screen: POSITIVE — AB

## 2012-04-21 MED ORDER — AMOXICILLIN 500 MG PO CAPS: 500.0000 mg | ORAL_CAPSULE | Freq: Two times a day (BID) | ORAL | Status: AC

## 2012-04-21 NOTE — Progress Notes (Signed)
  Subjective:    Patient ID: Ryan Zhang, male    DOB: 07/10/1946, 66 y.o.   MRN: [id number]  HPI Sore throat- sxs started last night at 8pm.  Progressed overnight, 'low grade HA', + hoarseness.  Painful to swallow.  No fevers.  HA is frontal but no sinus pain/pressure.  + sick contacts.  No cough.  No ear pain.   Review of Systems For ROS see HPI     Objective:   Physical Exam  Vitals reviewed. Constitutional: He appears well-developed and well-nourished. No distress.  HENT:  Head: Normocephalic and atraumatic.  Nose: Nose normal.       TMs normal bilaterally No TTP over sinuses + pharyngeal erythema w/ exudate present  Neck: Normal range of motion. Neck supple.  Pulmonary/Chest: Effort normal and breath sounds normal. No respiratory distress. He has no wheezes. He has no rales.  Lymphadenopathy:    He has cervical adenopathy.          Assessment & Plan:

## 2012-04-21 NOTE — Patient Instructions (Signed)
This is strep Start the Amox twice daily Tylenol/ibuprofen as needed for pain Drink plenty of fluids Hang in there!!!

## 2012-04-23 NOTE — Assessment & Plan Note (Signed)
New.  Pt's rapid strep +.  Start abx.  Reviewed supportive care and red flags that should prompt return.  Pt expressed understanding and is in agreement w/ plan.

## 2012-04-25 ENCOUNTER — Telehealth: Payer: Self-pay | Admitting: Family Medicine

## 2012-04-25 NOTE — Telephone Encounter (Signed)
Patient called left message on triage line to change pharmacy on file from [facility] to [facility]. Made change to [facility]

## 2012-06-05 ENCOUNTER — Other Ambulatory Visit: Payer: Self-pay | Admitting: Family Medicine

## 2012-06-05 MED ORDER — PIOGLITAZONE HCL-METFORMIN HCL 15-850 MG PO TABS: 1.0000 | ORAL_TABLET | Freq: Two times a day (BID) | ORAL | Status: DC

## 2012-06-05 NOTE — Telephone Encounter (Signed)
rx sent to pharmacy by e-script  

## 2012-06-05 NOTE — Telephone Encounter (Signed)
Refill Pioglitazone HCl-Metformin HCl (Tab)  15-850 MG Take 1 tablet by mouth 2 (two) times daily with a meal. #60, last fill 6.25.13, last ov 6.7.13 (Acute)

## 2012-06-12 ENCOUNTER — Ambulatory Visit (INDEPENDENT_AMBULATORY_CARE_PROVIDER_SITE_OTHER): Payer: Medicare Other | Admitting: Neurology

## 2012-06-12 ENCOUNTER — Encounter: Payer: Self-pay | Admitting: Neurology

## 2012-06-12 VITALS — BP 118/72 | HR 76 | Wt 258.0 lb

## 2012-06-12 DIAGNOSIS — G459 Transient cerebral ischemic attack, unspecified: Secondary | ICD-10-CM

## 2012-06-12 NOTE — Progress Notes (Signed)
Dear Dr. Beverely Low,   Thank you for having me see Ryan Zhang back in clinic today at [facility] for his problem with transient bilateral loss of vision. As you may recall, he is a 66 y.o. year old male with a history of diabetes, bipolar disorder, chronic vertigo, chronic difficulty swallowing who presents with a spell of loss of vision while he was driving his car.   When I first saw him I was worried about vertebrobasilar insufficiency. I got a CTA head and neck because the MRA did not properly visualize the takeoff of the bilateral vertebral arteries. While it did reveal a 60% stenosis of the left vert, the right vert was open(but smaller). I also switched him from aspirin to Plavix despite that I did not think this was likely to be a TIA. Notably a ophthalmologist had found a plaque in his eye last year.   At his last visit I also noted that he had significant changes in his blood pressure with standing.  Routine EEG was also noted to be normal.  MRI brain was suspicious for a hot cross bun sign.  He has not had any further spells of loss of vision. I did send him to get a swallowing study because of his chronic swallowing problems. It revealed oral/pharyngeal dysfunction as I suspected. MG titres were normal.  He chronic vertigo has been quiescent("as his hearing diminishes" in his left ear). His swallowing problems involving his "upper throat" have been progressive over the last year or so, although since I last saw him he has not had any change.  Today he endorses some orthostatic dizziness. He is not incontinent. He does have ED. He sweats and does not have early satiety.  He has had one fall, when he fell off a ladder but did not hurt himself.  He continues to have poorly controlled depression.  He also had diplopia at his last visit.  As above MG titres were normal.  He says his diplopia has resolved.   Medical history, social history, and family history were reviewed and have not  changed since the last clinic visit.  Current Outpatient Prescriptions on File Prior to Visit  Medication Sig Dispense Refill  . asenapine (SAPHRIS) 5 MG SUBL Place under the tongue daily.        . benztropine (COGENTIN) 2 MG tablet Take 2 mg by mouth daily.        . budesonide-formoterol (SYMBICORT) 80-4.5 MCG/ACT inhaler Inhale 2 puffs into the lungs 2 (two) times daily.        . clonazePAM (KLONOPIN) 0.5 MG tablet Take 0.5 mg by mouth 4 (four) times daily as needed.        . clopidogrel (PLAVIX) 75 MG tablet Take 1 tablet (75 mg total) by mouth daily.  30 tablet  11  . DULoxetine (CYMBALTA) 60 MG capsule Take 60 mg by mouth daily.        . enalapril (VASOTEC) 2.5 MG tablet Take 1 tablet (2.5 mg total) by mouth daily.  30 tablet  6  . esomeprazole (NEXIUM) 40 MG capsule Take 40 mg by mouth daily.      . fexofenadine (ALLEGRA) 180 MG tablet Take 180 mg by mouth daily.        . fluticasone (FLONASE) 50 MCG/ACT nasal spray Place 2 sprays into the nose 2 (two) times daily as needed.       . gabapentin (NEURONTIN) 300 MG capsule Take 300 mg by mouth 3 (three) times  daily.        . glucose blood (ONE TOUCH ULTRA TEST) test strip 1 each by Other route as needed. Use as instructed       . HYDROcodone-acetaminophen (VICODIN) 5-500 MG per tablet Take 1 tablet by mouth every 4 (four) hours as needed for pain.  45 tablet  0  . LamoTRIgine (LAMICTAL XR) 50 MG TB24 Take by mouth daily.        . meclizine (ANTIVERT) 25 MG tablet Take 25 mg by mouth daily as needed.        . Multiple Vitamins-Minerals ([employer] SPECTRAVITE SENIOR) TABS Take by mouth daily.        . naproxen (EC-NAPROSYN) 500 MG EC tablet Take 500 mg by mouth 2 (two) times daily as needed.       [name] NEXIUM 40 MG capsule TAKE 1 CAPSULE (40 MG TOTAL) BY MOUTH DAILY BEFORE BREAKFAST.  30 capsule  11  . nystatin-triamcinolone ointment (MYCOLOG) Apply topically 2 (two) times daily.  60 g  1  . pioglitazone-metformin (ACTOPLUS MET) 15-850 MG per tablet  Take 1 tablet by mouth 2 (two) times daily with a meal.  60 tablet  2  . simvastatin (ZOCOR) 40 MG tablet Take 1 tablet (40 mg total) by mouth at bedtime.  30 tablet  6  . traZODone (DESYREL) 50 MG tablet Take 50 mg by mouth 3 (three) times daily as needed.          Allergies  Allergen Reactions  . Bupropion Hcl   . Citalopram Hydrobromide   . Divalproex Sodium   . Methocarbamol   . Oxycodone-Acetaminophen   . Paroxetine   . Risperidone     REACTION: hyper    ROS:  13 systems were reviewed and are notable for depression as above. All other review of systems are unremarkable.  Exam: . Filed Vitals:   06/12/12 1452  BP: 118/72  Pulse: 76  Weight: 258 lb (117.028 kg)    In general, disheveled appearing man.  Mental status:   The patient is oriented to person, place and time. Recent and remote memory are intact. Attention span and concentration are normal. Language including repetition, naming, following commands are intact. Fund of knowledge of current and historical events, as well as vocabulary are normal.  Cranial Nerves: Pupils are equally round and reactive to light. Visual fields full to confrontation. Extraocular movements are intact without nystagmus(his rotatory nystagmus on left gaze is very slight if there at all), no significant saccadic intrusion.  Saccades normal. Facial sensation and muscles of mastication are intact. Muscles of facial expression are symmetric, but slightly masked. Hearing decreased to bilateral finger rub. Tongue protrusion, uvula, palate midline.  Shoulder shrug intact  Motor:  Normal bulk and tone including axial tone, no drift and 5/5 muscle strength bilaterally.  Reflexes:  1+ UE, absent lower.  Coordination:  Normal finger to nose,   Gait:  Mildly wide based gait and station.  Romberg negative.  Impression/Recommendations: 1.  Spell of visual loss - I suspect this was likely related to transient hypoperfusion.  Perhaps his orthostasis  contributed to this, but this seems less likely.  If he has another spell I would suggest that he have a Holter monitor.  I have said that he can drive six months after the last spell.  He will stay on Plavix despite I think this is low probability of being an ischemic event. 2.  Dysphagia/dysarthria - This will have to be monitored.  If  it progresses he may need an EMG of his bulbar musculature, repetitive nerve stiulation as well as MUSK antibodies.  The changes in his pons are concerning suggesting a hot cross bun sign that may represent changes seen in multiple system atrophy.  If he has progression of his symptoms I would as you to re-refer him to Dr. Arbutus Leas for consideration of this diagnosis. 3.  Gait instability - Perhaps related to a possible diagnosis of MSA.  It should be noted that I have not discussed the diagnosis of MSA with the patient given that I am very unsure that this is what it is.   The patient will follow up with you.    Lupita Raider Modesto Charon, MD [facility], [place]

## 2012-07-03 ENCOUNTER — Other Ambulatory Visit: Payer: Self-pay | Admitting: Family Medicine

## 2012-07-03 NOTE — Telephone Encounter (Signed)
Refill done.  

## 2012-07-18 ENCOUNTER — Encounter: Payer: Self-pay | Admitting: Internal Medicine

## 2012-07-18 ENCOUNTER — Ambulatory Visit (INDEPENDENT_AMBULATORY_CARE_PROVIDER_SITE_OTHER): Payer: Medicare Other | Admitting: Internal Medicine

## 2012-07-18 ENCOUNTER — Telehealth: Payer: Self-pay | Admitting: Family Medicine

## 2012-07-18 VITALS — BP 120/72 | HR 84 | Temp 98.1°F | Wt 255.0 lb

## 2012-07-18 DIAGNOSIS — J45901 Unspecified asthma with (acute) exacerbation: Secondary | ICD-10-CM

## 2012-07-18 MED ORDER — PREDNISONE 10 MG PO TABS: ORAL_TABLET | ORAL | Status: DC

## 2012-07-18 NOTE — Telephone Encounter (Signed)
Pt coming in today 2 1:30 to see Dr Drue Novel.

## 2012-07-18 NOTE — Telephone Encounter (Signed)
noted 

## 2012-07-18 NOTE — Assessment & Plan Note (Signed)
Patient with history of asthma and diabetes presents with exacerbation most likely due to to a URI/bronchitis. Despite self re-initiation of Symbicort he is wheezing. I think he may benefit from a low dose of the steroids as long as his well-controlled DM remains ok Plan-- see instructions

## 2012-07-18 NOTE — Telephone Encounter (Signed)
Caller: Kolt/Patient; Patient Name: Ryan Zhang; PCP: Sheliah Hatch.; Best Callback Phone Number: [phone].  Patient calling about asthma.  Seen in UC 07/15/12 and treated for bronchitis with zpack.  Has since developed worsening wheezing.  Using symbacort but states albuterol does not usually give him any relief.  Per asthma protocol, advised appointment within 4 hours; states office staff gave him first available appointment at 1330 07/18/12.  To use albuterol 2 puffs q 4-6h while awake; callback parameters given.

## 2012-07-18 NOTE — Patient Instructions (Addendum)
Rest, fluids , tylenol For cough, take Mucinex DM twice a day as needed  symbicort 2 puffs twice a day until you feel completely well Ventolin as needed for wheezing Continue with the zpack Start prednisone as prescribed, stop prednisone if your blood sugar is more than 200; check your blood sugars at least twice a day for the next 5 days Call if no better in few days Call anytime if the symptoms are severe

## 2012-07-18 NOTE — Progress Notes (Signed)
  Subjective:    Patient ID: Ryan Zhang, male    DOB: 01/02/1946, 66 y.o.   MRN: [id number]  HPI Acute visit, 66 year old gentleman with history of hypertension, asthma, diabetes, hyperlipidemia, bipolar, obesity and multiple drug allergies, presents with the following problem: Started with a cold 3 days ago, went to urgent care, was prescribed a Z-Pak. Since then he is a slightly better but his asthma is still "acting up". He is feeling some chest congestion and wheezing. 2 days ago he self restart Symbicort twice a day and albuterol as needed.   Review of Systems No fever or chills No sinus pain or congestion Mild difficulty breathing, cough is dry, no sputum. Denies nausea vomiting, had diarrhea once before the onset of symptoms.    Objective:   Physical Exam  General -- alert, well-developed, and overweight appearing. No apparent distress.  HEENT -- TMs normal, throat w/o redness, face symmetric and not tender to palpation, nose  congested EOMI Lungs --  no distress, nor rhonchi, mild wheezing bilaterally.  Heart-- normal rate, regular rhythm, no murmur, and no gallop.   Extremities-- no pretibial edema bilaterally  Neurologic-- alert & oriented X3 and strength normal in all extremities. Psych-- Cognition and judgment appear intact. Alert and cooperative with normal attention span and concentration.  not anxious appearing and not depressed appearing.       Assessment & Plan:

## 2012-07-21 ENCOUNTER — Telehealth: Payer: Self-pay | Admitting: Family Medicine

## 2012-07-21 ENCOUNTER — Telehealth: Payer: Self-pay | Admitting: *Deleted

## 2012-07-21 ENCOUNTER — Encounter (HOSPITAL_BASED_OUTPATIENT_CLINIC_OR_DEPARTMENT_OTHER): Payer: Self-pay | Admitting: *Deleted

## 2012-07-21 ENCOUNTER — Emergency Department (HOSPITAL_BASED_OUTPATIENT_CLINIC_OR_DEPARTMENT_OTHER)
Admission: EM | Admit: 2012-07-21 | Discharge: 2012-07-21 | Disposition: A | Discharge status: Home / Self Care | Payer: Medicare Other | Attending: Emergency Medicine | Admitting: Emergency Medicine

## 2012-07-21 ENCOUNTER — Emergency Department (HOSPITAL_BASED_OUTPATIENT_CLINIC_OR_DEPARTMENT_OTHER): Payer: Medicare Other

## 2012-07-21 DIAGNOSIS — I10 Essential (primary) hypertension: Secondary | ICD-10-CM | POA: Insufficient documentation

## 2012-07-21 DIAGNOSIS — R0789 Other chest pain: Secondary | ICD-10-CM | POA: Insufficient documentation

## 2012-07-21 DIAGNOSIS — E119 Type 2 diabetes mellitus without complications: Secondary | ICD-10-CM | POA: Insufficient documentation

## 2012-07-21 DIAGNOSIS — Z79899 Other long term (current) drug therapy: Secondary | ICD-10-CM | POA: Insufficient documentation

## 2012-07-21 DIAGNOSIS — R0602 Shortness of breath: Secondary | ICD-10-CM | POA: Insufficient documentation

## 2012-07-21 DIAGNOSIS — R05 Cough: Secondary | ICD-10-CM | POA: Insufficient documentation

## 2012-07-21 DIAGNOSIS — K219 Gastro-esophageal reflux disease without esophagitis: Secondary | ICD-10-CM | POA: Insufficient documentation

## 2012-07-21 DIAGNOSIS — R059 Cough, unspecified: Secondary | ICD-10-CM | POA: Insufficient documentation

## 2012-07-21 DIAGNOSIS — J45901 Unspecified asthma with (acute) exacerbation: Secondary | ICD-10-CM | POA: Insufficient documentation

## 2012-07-21 DIAGNOSIS — E785 Hyperlipidemia, unspecified: Secondary | ICD-10-CM | POA: Insufficient documentation

## 2012-07-21 MED ORDER — IPRATROPIUM BROMIDE 0.02 % IN SOLN
0.5000 mg | Freq: Once | RESPIRATORY_TRACT | Status: AC
  Administered 2012-07-21: 0.5 mg via RESPIRATORY_TRACT

## 2012-07-21 MED ORDER — IPRATROPIUM BROMIDE 0.02 % IN SOLN
RESPIRATORY_TRACT | Status: AC
  Filled 2012-07-21: qty 2.5

## 2012-07-21 MED ORDER — ALBUTEROL SULFATE (5 MG/ML) 0.5% IN NEBU
INHALATION_SOLUTION | RESPIRATORY_TRACT | Status: AC
  Filled 2012-07-21: qty 1

## 2012-07-21 MED ORDER — PREDNISONE 10 MG PO TABS: ORAL_TABLET | ORAL | Status: DC

## 2012-07-21 MED ORDER — ALBUTEROL SULFATE (5 MG/ML) 0.5% IN NEBU
5.0000 mg | INHALATION_SOLUTION | Freq: Once | RESPIRATORY_TRACT | Status: AC
  Administered 2012-07-21: 5 mg via RESPIRATORY_TRACT
  Filled 2012-07-21: qty 1

## 2012-07-21 MED ORDER — IPRATROPIUM BROMIDE 0.02 % IN SOLN
0.5000 mg | Freq: Once | RESPIRATORY_TRACT | Status: AC
  Administered 2012-07-21: 0.5 mg via RESPIRATORY_TRACT
  Filled 2012-07-21: qty 2.5

## 2012-07-21 MED ORDER — AEROCHAMBER PLUS MISC: Status: AC

## 2012-07-21 MED ORDER — ALBUTEROL SULFATE (5 MG/ML) 0.5% IN NEBU
5.0000 mg | INHALATION_SOLUTION | Freq: Once | RESPIRATORY_TRACT | Status: AC
  Administered 2012-07-21: 5 mg via RESPIRATORY_TRACT

## 2012-07-21 NOTE — ED Provider Notes (Signed)
History     CSN: [id number]  Arrival date & time 07/21/12  1554   First MD Initiated Contact with Patient 07/21/12 1618      Chief Complaint  Patient presents with  . Shortness of Breath    (Consider location/radiation/quality/duration/timing/severity/associated sxs/prior treatment) Patient is a 66 y.o. male presenting with shortness of breath. The history is provided by the patient, the spouse and medical records.  Shortness of Breath  Associated symptoms include cough, shortness of breath and wheezing. Pertinent negatives include no chest pain and no fever.   Ryan Zhang is a 66 y.o. male presents to the emergency department complaining of SOB, wheezing.  The onset of the symptoms was  gradual starting 6 days ago.  The patient has associated cough.  The symptoms have been  persistent, gradually worsened.  nothing makes the symptoms worse and cortisone, mucinex and delsyn makes symptoms some better.  The patient denies fever, chills, headache, chest pain.  URI symptoms and evaluated at PCP on Sept 1st; tx with z-pak.  Sept 2nd no better therefore he was re-evaluated and put on prednisone 5 day course.  Hx of URI with bronchitis progressing to asthma attack.  Asthma is generally under control.  Symbicort, flovent with flares.  Wheezing worse today with persistent cough.   Past Medical History  Diagnosis Date  . Diabetes mellitus   . Hyperlipidemia   . Hypertension   . Bipolar affective   . Allergy     allergy shots Dr. Corinda Gubler  . DJD (degenerative joint disease)   . Obesity   . Asthma   . GERD (gastroesophageal reflux disease)   . Meniere's disease   . Hollenhorst plaque     right eye  . Personal history of colonic polyps 11/2004    hyperplastic.  [name] Dysphagia   . Kidney stones   . Pancreatitis   . Hepatitis A     as teenager 60's  . Gallstones   . Motility disorder, esophageal   . Stroke     per MRI    Past Surgical History  Procedure Date  . Cholecystectomy   .  Total knee arthroplasty     both knees    Family History  Problem Relation Age of Onset  . Diabetes Father   . Heart disease Maternal Grandmother   . Colon cancer Neg Hx   . Cancer Sister     unknown type    History  Substance Use Topics  . Smoking status: Former Smoker    Quit date: 11/15/1964  . Smokeless tobacco: Never Used  . Alcohol Use: No      Review of Systems  Constitutional: Negative for fever, diaphoresis, appetite change, fatigue and unexpected weight change.  HENT: Negative for mouth sores, neck pain and neck stiffness.   Eyes: Negative for visual disturbance.  Respiratory: Positive for cough, chest tightness, shortness of breath and wheezing. Negative for choking.   Cardiovascular: Negative for chest pain, palpitations and leg swelling.  Gastrointestinal: Negative for nausea, vomiting, abdominal pain, diarrhea and constipation.  Genitourinary: Negative for dysuria, urgency, frequency and hematuria.  Musculoskeletal: Negative for back pain.  Skin: Negative for rash.  Neurological: Negative for syncope, light-headedness and headaches.  Hematological: Does not bruise/bleed easily.  Psychiatric/Behavioral: Negative for disturbed wake/sleep cycle. The patient is not nervous/anxious.     Allergies  Bupropion hcl; Citalopram hydrobromide; Divalproex sodium; Methocarbamol; Oxycodone-acetaminophen; Paroxetine; Percocet; Risperidone; Smz-tmp ds; and Wellbutrin  Home Medications   Current Outpatient Rx  Name  Route Sig Dispense Refill  . ASENAPINE MALEATE 5 MG SL SUBL Sublingual Place under the tongue daily.      [name] BENZTROPINE MESYLATE 2 MG PO TABS Oral Take 2 mg by mouth daily.      . BUDESONIDE-FORMOTEROL FUMARATE 80-4.5 MCG/ACT IN AERO Inhalation Inhale 2 puffs into the lungs 2 (two) times daily.     [name] CLONAZEPAM 0.5 MG PO TABS Oral Take 0.5 mg by mouth 4 (four) times daily as needed.      . CLOPIDOGREL BISULFATE 75 MG PO TABS Oral Take 1 tablet (75 mg total) by  mouth daily. 30 tablet 11  . DULOXETINE HCL 60 MG PO CPEP Oral Take 60 mg by mouth daily.      . ENALAPRIL MALEATE 2.5 MG PO TABS Oral Take 1 tablet (2.5 mg total) by mouth daily. 30 tablet 6  . ESOMEPRAZOLE MAGNESIUM 40 MG PO CPDR Oral Take 40 mg by mouth daily.    [name] FEXOFENADINE HCL 180 MG PO TABS Oral Take 180 mg by mouth daily.      [name] GABAPENTIN 300 MG PO CAPS Oral Take 300 mg by mouth 3 (three) times daily.      [name] GLUCOSE BLOOD VI STRP Other 1 each by Other route as needed. Use as instructed     . LAMOTRIGINE ER 50 MG PO TB24 Oral Take by mouth daily.      . [employer] SPECTRAVITE SENIOR PO TABS Oral Take by mouth daily.      [name] NAPROXEN 500 MG PO TBEC Oral Take 500 mg by mouth 2 (two) times daily as needed.     [name] PIOGLITAZONE HCL-METFORMIN HCL 15-850 MG PO TABS Oral Take 1 tablet by mouth 2 (two) times daily with a meal. 60 tablet 2  . SIMVASTATIN 40 MG PO TABS  TAKE 1 TABLET BY MOUTH ATBEDTIME 30 tablet 1  . AMOXICILLIN 500 MG PO CAPS Oral Take 4 tablets by mouth Daily. Uses this medication before dental treatment.    . NYSTATIN-TRIAMCINOLONE 100000-0.1 UNIT/GM-% EX OINT Topical Apply topically 2 (two) times daily. 60 g 1  . PREDNISONE 10 MG PO TABS  10mg  tablets Take 6 tabs day 1, 5 tabs day 2, 4 tabs day 3, 3 tabs day 4, 2 tabs day 5, and 1 on day 6 21 tablet 0  . AEROCHAMBER PLUS MISC  Use as instructed 1 each 2    BP 134/89  Pulse 85  Temp 97.6 F (36.4 C) (Oral)  Resp 20  Ht 5' 9.5" (1.765 m)  Wt 255 lb (115.667 kg)  BMI 37.12 kg/m2  SpO2 94%  Physical Exam  Nursing note and vitals reviewed. Constitutional: He appears well-developed and well-nourished. No distress.  HENT:  Head: Normocephalic and atraumatic.  Mouth/Throat: Oropharynx is clear and moist. No oropharyngeal exudate.  Eyes: Conjunctivae are normal. No scleral icterus.  Neck: Normal range of motion. Neck supple.  Cardiovascular: Normal rate, regular rhythm, normal heart sounds and intact distal pulses.  Exam  reveals no gallop and no friction rub.   No murmur heard. Pulmonary/Chest: He is in respiratory distress. He has wheezes. He has no rales. He exhibits no tenderness.  Abdominal: Soft. Bowel sounds are normal. He exhibits no mass. There is no tenderness. There is no rebound and no guarding.  Musculoskeletal: Normal range of motion. He exhibits no edema.  Lymphadenopathy:    He has no cervical adenopathy.  Neurological: He is alert.       Speech is clear  and goal oriented Moves extremities without ataxia  Skin: Skin is warm and dry. No rash noted. He is not diaphoretic.  Psychiatric: He has a normal mood and affect.    ED Course  Procedures (including critical care time)  Labs Reviewed - No data to display Dg Chest 2 View  07/21/2012  *RADIOLOGY REPORT*  Clinical Data: Shortness of breath.  CHEST - 2 VIEW  Comparison: 11/26/2011.  Findings: The cardiac silhouette, mediastinal and hilar contours are stable.  Borderline cardiac enlargement.  There are chronic scarring changes bilaterally.  No definite acute overlying pulmonary process. No pleural effusion or pneumothorax.  The bony thorax is intact.  IMPRESSION: Chronic scarring changes but no definite acute pulmonary findings.   Original Report Authenticated By: P. Loralie Champagne, M.D.      1. Asthma exacerbation   2. Cough   3. Shortness of breath       MDM  Ryan Zhang presents with SOB.  Hx of asthma exacerbation when ill.  Pt with URI ssx x 1 week and treated with Z-pak and prednisone.  Wheezing on arrival in ED.  Pt with resolution of SOB and wheezing with 2 neb treatments.  Patient ambulated in ED with O2 saturations maintained >90, no current signs of respiratory distress. Lung exam improved after hospital treatment. Pt states they are breathing at baseline.  No evidence of pneumonia on X-ray.  I have discussed these findings with the patient.  I believe this was an asthma exacerbation 2/2 an illness and possibly allergies.  I  will discharge home with a repeat course of prednisone and a spacer to help with albuterol MDI inhaler.  I have also discussed reasons to return immediately to the ER.   Pt has been instructed to continue using prescribed medications and to speak with PCP about today's exacerbation.Patient expresses understanding and agrees with plan.  1. Medications: prednisone taper 2. Treatment: Take medication as prescribed 3. Follow Up: Followup with your primary care physician as needed.   Followup with your doctor in regards to your asthma exacerbation. Read the instructions below to learn more about factors that can trigger asthma. Use her albuterol inhaler as directed. Your more than welcome to return to the emergency department if you become short of breath, your albuterol inhaler did not relieve symptoms, you are having chest pain associated with difficulty breathing, or any symptoms that are concerning to you.    IDENTIFY AND CONTROL FACTORS THAT MAKE YOUR ASTHMA WORSE A number of common things can set off or make your asthma symptoms worse (asthma triggers). Keep track of your asthma symptoms for several weeks, detailing all the environmental and emotional factors that are linked with your asthma. When you have an asthma attack, go back to your asthma diary to see which factor, or combination of factors, might have contributed to it. Once you know what these factors are, you can take steps to control many of them.  Allergies: If you have allergies and asthma, it is important to take asthma prevention steps at home. Asthma attacks (worsening of asthma symptoms) can be triggered by allergies, which can cause temporary increased inflammation of your airways. Minimizing contact with the substance to which you are allergic will help prevent an asthma attack. Animal Dander:   Some people are allergic to the flakes of skin or dried saliva from animals with fur or feathers. Keep these pets out of your home.    If you can't keep a pet outdoors, keep the  pet out of your bedroom and other sleeping areas at all times, and keep the door closed.   Remove carpets and furniture covered with cloth from your home. If that is not possible, keep the pet away from fabric-covered furniture and carpets.  Dust Mites:  Many people with asthma are allergic to dust mites. Dust mites are tiny bugs that are found in every home, in mattresses, pillows, carpets, fabric-covered furniture, bedcovers, clothes, stuffed toys, fabric, and other fabric-covered items.   Cover your mattress in a special dust-proof cover.   Cover your pillow in a special dust-proof cover, or wash the pillow each week in hot water. Water must be hotter than 130 F to kill dust mites. Cold or warm water used with detergent and bleach can also be effective.   Wash the sheets and blankets on your bed each week in hot water.   Try not to sleep or lie on cloth-covered cushions.   Call ahead when traveling and ask for a smoke-free hotel room. Bring your own bedding and pillows, in case the hotel only supplies feather pillows and down comforters, which may contain dust mites and cause asthma symptoms.   Remove carpets from your bedroom and those laid on concrete, if you can.   Keep stuffed toys out of the bed, or wash the toys weekly in hot water or cooler water with detergent and bleach.  Cockroaches:  Many people with asthma are allergic to the droppings and remains of cockroaches.   Keep food and garbage in closed containers. Never leave food out.   Use poison baits, traps, powders, gels, or paste (for example, boric acid).   If a spray is used to kill cockroaches, stay out of the room until the odor goes away.  Indoor Mold:  Fix leaky faucets, pipes, or other sources of water that have mold around them.   Clean moldy surfaces with a cleaner that has bleach in it.  Pollen and Outdoor Mold:  When pollen or mold spore counts are high, try  to keep your windows closed.   Stay indoors with windows closed from late morning to afternoon, if you can. Pollen and some mold spore counts are highest at that time.   Ask your caregiver whether you need to take or increase anti-inflammatory medicine before your allergy season starts.  Irritants:   Tobacco smoke is an irritant. If you smoke, ask your caregiver how you can quit. Ask family members to quit smoking, too. Do not allow smoking in your home or car.   If possible, do not use a wood-burning stove, kerosene heater, or fireplace. Minimize exposure to all sources of smoke, including incense, candles, fires, and fireworks.   Try to stay away from strong odors and sprays, such as perfume, talcum powder, hair spray, and paints.   Decrease humidity in your home and use an indoor air cleaning device. Reduce indoor humidity to below 60 percent. Dehumidifiers or central air conditioners can do this.   Try to have someone else vacuum for you once or twice a week, if you can. Stay out of rooms while they are being vacuumed and for a short while afterward.   If you vacuum, use a dust mask from a hardware store, a double-layered or microfilter vacuum cleaner bag, or a vacuum cleaner with a HEPA filter.   Sulfites in foods and beverages can be irritants. Do not drink beer or wine, or eat dried fruit, processed potatoes, or shrimp if they cause asthma symptoms.  Cold air can trigger an asthma attack. Cover your nose and mouth with a scarf on cold or windy days.   Several health conditions can make asthma more difficult to manage, including runny nose, sinus infections, reflux disease, psychological stress, and sleep apnea. Your caregiver will treat these conditions, as well.   Avoid close contact with people who have a cold or the flu, since your asthma symptoms may get worse if you catch the infection from them. Wash your hands thoroughly after touching items that may have been handled by  people with a respiratory infection.   Get a flu shot every year to protect against the flu virus, which often makes asthma worse for days or weeks. Also get a pneumonia shot once every five to 10 years.  Drugs:  Aspirin and other painkillers can cause asthma attacks. 10% to 20% of people with asthma have sensitivity to aspirin or a group of painkillers called non-steroidal anti-inflammatory drugs (NSAIDS), such as ibuprofen and naproxen. These drugs are used to treat pain and reduce fevers. Asthma attacks caused by any of these medicines can be severe and even fatal. These drugs must be avoided in people who have known aspirin sensitive asthma. Products with acetaminophen are considered safe for people who have asthma. It is important that people with aspirin sensitivity read labels of all over-the-counter drugs used to treat pain, colds, coughs, and fever.   Beta blockers and ACE inhibitors are other drugs which you should discuss with your caregiver, in relation to your asthma.   EXERCISE  If you have exercise-induced asthma, or are planning vigorous exercise, or exercise in cold, humid, or dry environments, prevent exercise-induced asthma by following your caregiver's advice regarding asthma treatment before exercising. Charlyn Minerva Deziyah Arvin, PA-C 07/21/12 1846

## 2012-07-21 NOTE — Telephone Encounter (Signed)
Noted pt is currently being treated in [facility] ED

## 2012-07-21 NOTE — Telephone Encounter (Signed)
See phone note: MD Beverely Low agrees with ER evaluation, pt advised by this nurse to go to ED for evaluation per verbal from MD Lowne in absence of MD Tabori  per current sxs after treatment noted on 07-18-12

## 2012-07-21 NOTE — ED Notes (Signed)
Pt c/o SOB and cough HX asthma

## 2012-07-21 NOTE — Telephone Encounter (Signed)
Noted.  Agree w/ ER if increased wheezing and SOB.

## 2012-07-21 NOTE — Telephone Encounter (Signed)
Caller: Elijan/Patient; Patient Name: Ryan Zhang; PCP: Sheliah Hatch.; Best Callback Phone Number: [phone]; Reason for call: Asthma/Cold Sx. Started on Z- pack on 07/17/12. Seen by Dr. Sherlyn Lick on 07/18/12 and started on Prednisone- last dose today. Using Flovent Inhaler.as directed. He is wheezing, feeling short of breath and having coughing spells with any activity/exertion. MD advised be he seen in the ER, but wife not sure if he needs to go. Triage per Asthma Protocol and ER advised per MD advice so he can have chest X-ray and r/o Pneumonia.

## 2012-07-21 NOTE — Telephone Encounter (Signed)
Pt called to advise that he has been wheezing and coughing a lot and is using his inhaler more and more, pt was seen by MD Drue Novel 07-18-12 and given z-pack, prednisone and inhalers, feels better than he did at office visit but not much better,audibly wheezing during phone call to this nurse as well as coughing advised pt to go to the ED, pt advised he will have his wife take him to [facility] per verbal ok from MD Lowne in absence of MD Beverely Low,

## 2012-07-22 NOTE — ED Provider Notes (Signed)
Medical screening examination/treatment/procedure(s) were performed by non-physician practitioner and as supervising physician I was immediately available for consultation/collaboration.   Gavin Pound. Jamez Ambrocio, MD 07/22/12 1650

## 2012-07-24 ENCOUNTER — Telehealth: Payer: Self-pay | Admitting: Family Medicine

## 2012-07-24 MED ORDER — ENALAPRIL MALEATE 2.5 MG PO TABS: 2.5000 mg | ORAL_TABLET | Freq: Every day | ORAL | Status: DC

## 2012-07-24 NOTE — Telephone Encounter (Signed)
Refill: Enalapril maleate 2.5 mg. Take 1 tablet every day. Qty 30. Last fill 06-19-12

## 2012-07-24 NOTE — Telephone Encounter (Signed)
rx sent to pharmacy by e-script  

## 2012-08-03 ENCOUNTER — Ambulatory Visit (INDEPENDENT_AMBULATORY_CARE_PROVIDER_SITE_OTHER): Payer: Medicare Other | Admitting: Family Medicine

## 2012-08-03 ENCOUNTER — Encounter: Payer: Self-pay | Admitting: Family Medicine

## 2012-08-03 VITALS — BP 121/80 | HR 84 | Temp 98.6°F | Ht 69.0 in | Wt 256.6 lb

## 2012-08-03 DIAGNOSIS — Z23 Encounter for immunization: Secondary | ICD-10-CM

## 2012-08-03 DIAGNOSIS — E119 Type 2 diabetes mellitus without complications: Secondary | ICD-10-CM

## 2012-08-03 DIAGNOSIS — F319 Bipolar disorder, unspecified: Secondary | ICD-10-CM

## 2012-08-03 DIAGNOSIS — E785 Hyperlipidemia, unspecified: Secondary | ICD-10-CM

## 2012-08-03 NOTE — Assessment & Plan Note (Signed)
Chronic problem, UTD on eye exam.  Has recently been treated w/ steroids so I expect some jump in A1C.  Pt not checking CBGs regularly.  Currently asymptomatic.  Will continue to follow closely.

## 2012-08-03 NOTE — Assessment & Plan Note (Signed)
Chronic problem.  Tolerating statin w/out difficulty.  Check labs.  Adjust meds prn  
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
## 2012-08-03 NOTE — Assessment & Plan Note (Signed)
Deteriorated.  Pt now depressed.  Has both psychiatrist and psychologist who are actively working on this issue.  Pt denies current SI/HI.  Will continue to follow and assist as able.

## 2012-08-03 NOTE — Patient Instructions (Addendum)
Follow up in 3 months to recheck diabetes We'll notify you of your lab results and make any changes if needed Call with any questions or concerns Hang in there!!!  Let me know if there's anything I can do!!

## 2012-08-04 ENCOUNTER — Other Ambulatory Visit: Payer: Self-pay

## 2012-08-04 ENCOUNTER — Other Ambulatory Visit: Payer: Self-pay | Admitting: Family Medicine

## 2012-08-04 LAB — LIPID PANEL
Cholesterol: 123 mg/dL (ref 0–200)
HDL: 36.7 mg/dL — ABNORMAL LOW (ref >39.00)
LDL Cholesterol: 66 mg/dL (ref 0–99)
Total CHOL/HDL Ratio: 3
Triglycerides: 102 mg/dL (ref 0.0–149.0)
VLDL: 20.4 mg/dL (ref 0.0–40.0)

## 2012-08-04 LAB — BASIC METABOLIC PANEL
BUN: 22 mg/dL (ref 6–23)
CO2: 26 mEq/L (ref 19–32)
Calcium: 8.7 mg/dL (ref 8.4–10.5)
Chloride: 105 mEq/L (ref 96–112)
Creatinine, Ser: 1.2 mg/dL (ref 0.4–1.5)
GFR: 64.13 mL/min (ref >60.00)
Glucose, Bld: 128 mg/dL — ABNORMAL HIGH (ref 70–99)
Potassium: 4.2 mEq/L (ref 3.5–5.1)
Sodium: 138 mEq/L (ref 135–145)

## 2012-08-04 LAB — HEMOGLOBIN A1C: Hgb A1c MFr Bld: 6.5 % (ref 4.6–6.5)

## 2012-08-04 LAB — HEPATIC FUNCTION PANEL
ALT: 18 U/L (ref 0–53)
AST: 20 U/L (ref 0–37)
Albumin: 3.4 g/dL — ABNORMAL LOW (ref 3.5–5.2)
Alkaline Phosphatase: 66 U/L (ref 39–117)
Bilirubin, Direct: 0.2 mg/dL (ref 0.0–0.3)
Total Bilirubin: 1.2 mg/dL (ref 0.3–1.2)
Total Protein: 6.4 g/dL (ref 6.0–8.3)

## 2012-08-04 MED ORDER — GLUCOSE BLOOD VI STRP: ORAL_STRIP | Status: DC

## 2012-08-04 NOTE — Telephone Encounter (Signed)
Rx sent      KP 

## 2012-08-04 NOTE — Telephone Encounter (Signed)
please send test strips to [facility] per PC from LPN K.Pulliam [id number] 9.20.13 Patient was in yesterday for 58-month f/u advise kp as she was working up a patient and she failed to finish  Please send today  Thanks

## 2012-09-14 ENCOUNTER — Other Ambulatory Visit: Payer: Self-pay | Admitting: Family Medicine

## 2012-09-14 MED ORDER — PIOGLITAZONE HCL-METFORMIN HCL 15-850 MG PO TABS: 1.0000 | ORAL_TABLET | Freq: Two times a day (BID) | ORAL | Status: DC

## 2012-09-14 MED ORDER — SIMVASTATIN 40 MG PO TABS: 40.0000 mg | ORAL_TABLET | Freq: Every day | ORAL | Status: DC

## 2012-09-14 NOTE — Telephone Encounter (Signed)
rx sent to pharmacy by e-script  

## 2012-09-14 NOTE — Telephone Encounter (Signed)
refills x 2  last ov 9.19.13 follow up   1-Pioglitazone MET 15-850 MG Take 1 tablet by mouth 2 (two) times daily with a meal. #60 last fill 9.21.13   2-Simvastatin (Tab) 40 MG TAKE 1 TABLET BY MOUTH AT BEDTIME #30 last fill 9.21.13

## 2012-10-04 ENCOUNTER — Ambulatory Visit (INDEPENDENT_AMBULATORY_CARE_PROVIDER_SITE_OTHER): Payer: Medicare Other | Admitting: Family Medicine

## 2012-10-04 ENCOUNTER — Encounter: Payer: Self-pay | Admitting: Family Medicine

## 2012-10-04 VITALS — BP 100/62 | HR 76 | Temp 97.9°F | Wt 264.0 lb

## 2012-10-04 DIAGNOSIS — J40 Bronchitis, not specified as acute or chronic: Secondary | ICD-10-CM

## 2012-10-04 DIAGNOSIS — R0609 Other forms of dyspnea: Secondary | ICD-10-CM

## 2012-10-04 DIAGNOSIS — R0989 Other specified symptoms and signs involving the circulatory and respiratory systems: Secondary | ICD-10-CM

## 2012-10-04 MED ORDER — IPRATROPIUM BROMIDE 0.02 % IN SOLN
0.5000 mg | Freq: Once | RESPIRATORY_TRACT | Status: AC
  Administered 2012-10-04: 0.5 mg via RESPIRATORY_TRACT

## 2012-10-04 MED ORDER — ALBUTEROL SULFATE (5 MG/ML) 0.5% IN NEBU
2.5000 mg | INHALATION_SOLUTION | Freq: Once | RESPIRATORY_TRACT | Status: AC
  Administered 2012-10-04: 2.5 mg via RESPIRATORY_TRACT

## 2012-10-04 MED ORDER — GUAIFENESIN-CODEINE 100-10 MG/5ML PO SYRP: 10.0000 mL | ORAL_SOLUTION | Freq: Three times a day (TID) | ORAL | Status: DC | PRN

## 2012-10-04 MED ORDER — AZITHROMYCIN 250 MG PO TABS: ORAL_TABLET | ORAL | Status: DC

## 2012-10-04 NOTE — Patient Instructions (Addendum)
This is a bronchitis Start the Zpack as directed Use the codeine cough syrup as needed Drink plenty of fluids REST! Restart the Symbicort Call with any questions or concerns Hang in there! Happy Thanksgiving!!

## 2012-10-04 NOTE — Progress Notes (Signed)
  Subjective:    Patient ID: Ryan Zhang, male    DOB: 08-06-1946, 66 y.o.   MRN: [id number]  HPI Wheezing- last one in house to get URI.  sxs started 3 days ago w/ cough.  Cough is wet but not effectively productive.  Chest is tight and wheezing.  Hx of asthma.  No fevers.  Minimal nasal congestion.  No facial pain/pressure.  L ear pain.  Has not recently been taking Symbicort.  Not requiring albuterol.   Review of Systems For ROS see HPI     Objective:   Physical Exam  Vitals reviewed. Constitutional: He appears well-developed and well-nourished. No distress.  HENT:  Head: Normocephalic and atraumatic.  Right Ear: Tympanic membrane normal.  Left Ear: Tympanic membrane normal.  Nose: No mucosal edema or rhinorrhea. Right sinus exhibits no maxillary sinus tenderness and no frontal sinus tenderness. Left sinus exhibits no maxillary sinus tenderness and no frontal sinus tenderness.  Mouth/Throat: Mucous membranes are normal. No oropharyngeal exudate, posterior oropharyngeal edema or posterior oropharyngeal erythema.  Eyes: Conjunctivae normal and EOM are normal. Pupils are equal, round, and reactive to light.  Neck: Normal range of motion. Neck supple.  Cardiovascular: Normal rate, regular rhythm and normal heart sounds.   Pulmonary/Chest: Effort normal. No respiratory distress. He has wheezes (expiratory wheezes diffusely- air movement improved s/p neb tx).       + hacking cough  Lymphadenopathy:    He has no cervical adenopathy.  Skin: Skin is warm and dry.          Assessment & Plan:

## 2012-10-04 NOTE — Addendum Note (Signed)
Addended by: Candie Echevaria L on: 10/04/2012 12:06 PM   Modules accepted: Orders

## 2012-10-04 NOTE — Assessment & Plan Note (Signed)
Pt's sxs and PE consistent w/ dx.  Due to hx of asthma will start abx.  Restart Symbicort.  Cough meds prn- pt reports he is able to tolerate codeine despite listed percocet allergy.  Reviewed supportive care and red flags that should prompt return.  Pt expressed understanding and is in agreement w/ plan.

## 2012-10-09 ENCOUNTER — Ambulatory Visit (INDEPENDENT_AMBULATORY_CARE_PROVIDER_SITE_OTHER): Payer: Medicare Other | Admitting: Family Medicine

## 2012-10-09 ENCOUNTER — Encounter: Payer: Self-pay | Admitting: Family Medicine

## 2012-10-09 VITALS — BP 130/78 | HR 86 | Temp 98.2°F | Wt 264.4 lb

## 2012-10-09 DIAGNOSIS — J45901 Unspecified asthma with (acute) exacerbation: Secondary | ICD-10-CM

## 2012-10-09 MED ORDER — IPRATROPIUM BROMIDE 0.02 % IN SOLN
0.5000 mg | Freq: Four times a day (QID) | RESPIRATORY_TRACT | Status: DC | PRN
  Administered 2012-10-09: 0.5 mg via RESPIRATORY_TRACT

## 2012-10-09 MED ORDER — ALBUTEROL SULFATE (5 MG/ML) 0.5% IN NEBU
2.5000 mg | INHALATION_SOLUTION | Freq: Four times a day (QID) | RESPIRATORY_TRACT | Status: DC | PRN
  Administered 2012-10-09: 2.5 mg via RESPIRATORY_TRACT

## 2012-10-09 MED ORDER — PREDNISONE 10 MG PO TABS: ORAL_TABLET | ORAL | Status: DC

## 2012-10-09 MED ORDER — METHYLPREDNISOLONE ACETATE 80 MG/ML IJ SUSP
80.0000 mg | Freq: Once | INTRAMUSCULAR | Status: AC
  Administered 2012-10-09: 80 mg via INTRAMUSCULAR

## 2012-10-09 MED ORDER — DOXYCYCLINE HYCLATE 100 MG PO TABS: 100.0000 mg | ORAL_TABLET | Freq: Two times a day (BID) | ORAL | Status: DC

## 2012-10-09 NOTE — Assessment & Plan Note (Addendum)
Pt w/ severe asthma sxs today.  Did not improve after 1st duoneb.  Depo-medrol injxn given.  Pt remained in office for observation for >30 minutes at which point vitals were rechecked- pulse 83, O2 92%- and 2nd duoneb given.  Repeat vitals after 2nd neb- pulse 91, O2 94%.  Will start oral steroids tomorrow w/ doxycycline for possible infection.  Reviewed supportive care and red flags that should prompt return.  Pt expressed understanding and is in agreement w/ plan.

## 2012-10-09 NOTE — Patient Instructions (Addendum)
Start the Prednisone tomorrow morning Start the Doxy today to cover for possible bacterial infection causing your asthma to trigger Use the Albuterol as needed for shortness of breath and wheezing- it's ok to use when you need it! If your symptoms change or worsen- go to the ER Call with any questions or concerns Hang in there!!!

## 2012-10-09 NOTE — Progress Notes (Signed)
  Subjective:    Patient ID: Ryan Zhang, male    DOB: 1946/02/05, 66 y.o.   MRN: [id number]  HPI Asthma- pt reports 'bronchitis is gone but asthma is worse'.  Increased wheezing, SOB.  Using both Symbicort and Albuterol but little relief.  + cough- 'jags that last and last'.  Nonproductive.  No fevers.  Hx of asthma exacerbations requiring ER visit.   Review of Systems For ROS see HPI     Objective:   Physical Exam  Vitals reviewed. Constitutional: He appears well-developed and well-nourished. No distress.  HENT:  Head: Normocephalic and atraumatic.  Right Ear: Tympanic membrane normal.  Left Ear: Tympanic membrane normal.  Nose: No mucosal edema or rhinorrhea. Right sinus exhibits no maxillary sinus tenderness and no frontal sinus tenderness. Left sinus exhibits no maxillary sinus tenderness and no frontal sinus tenderness.  Mouth/Throat: Mucous membranes are normal. No oropharyngeal exudate, posterior oropharyngeal edema or posterior oropharyngeal erythema.  Eyes: Conjunctivae normal and EOM are normal. Pupils are equal, round, and reactive to light.  Neck: Normal range of motion. Neck supple.  Cardiovascular: Normal rate, regular rhythm and normal heart sounds.   Pulmonary/Chest: Effort normal. No respiratory distress. He has wheezes (severe expiratory wheezes throughout w/ prolonged expiratory phase.  no improvement after 1 duoneb.  some improvement after 2nd).       + hacking cough  Lymphadenopathy:    He has no cervical adenopathy.  Skin: Skin is warm and dry.          Assessment & Plan:

## 2012-10-11 ENCOUNTER — Telehealth: Payer: Self-pay

## 2012-10-11 NOTE — Telephone Encounter (Signed)
Spoke to Pt who states that he has spoken to [place] and they will be getting a package out to him today so there is no need for [place] to send in Rx.

## 2012-10-11 NOTE — Telephone Encounter (Signed)
Pt [name] stating OV Wednesday for asthma and you gave him an inhaler pt asking did you advise him Ryan Zhang will be bringing more meds out because he has 7 doses left and that's not going to last through holidays or does he need Rx sent to pharmacy? Plz advise CB# [phone] or [phone] MW

## 2012-10-11 NOTE — Telephone Encounter (Signed)
Please send rx if [facility] will not be there before tomorrow

## 2012-10-24 ENCOUNTER — Encounter: Payer: Self-pay | Admitting: Internal Medicine

## 2012-10-24 ENCOUNTER — Ambulatory Visit (INDEPENDENT_AMBULATORY_CARE_PROVIDER_SITE_OTHER): Payer: Medicare Other | Admitting: Internal Medicine

## 2012-10-24 ENCOUNTER — Ambulatory Visit (HOSPITAL_BASED_OUTPATIENT_CLINIC_OR_DEPARTMENT_OTHER)
Admission: RE | Admit: 2012-10-24 | Discharge: 2012-10-24 | Disposition: A | Discharge status: Home / Self Care | Payer: Medicare Other | Source: Ambulatory Visit | Attending: Internal Medicine | Admitting: Internal Medicine

## 2012-10-24 ENCOUNTER — Encounter: Payer: Self-pay | Admitting: *Deleted

## 2012-10-24 ENCOUNTER — Telehealth: Payer: Self-pay

## 2012-10-24 VITALS — BP 140/76 | HR 90 | Temp 97.9°F | Wt 264.0 lb

## 2012-10-24 DIAGNOSIS — J45901 Unspecified asthma with (acute) exacerbation: Secondary | ICD-10-CM

## 2012-10-24 DIAGNOSIS — R059 Cough, unspecified: Secondary | ICD-10-CM | POA: Insufficient documentation

## 2012-10-24 DIAGNOSIS — E119 Type 2 diabetes mellitus without complications: Secondary | ICD-10-CM

## 2012-10-24 DIAGNOSIS — J45909 Unspecified asthma, uncomplicated: Secondary | ICD-10-CM | POA: Insufficient documentation

## 2012-10-24 DIAGNOSIS — R05 Cough: Secondary | ICD-10-CM | POA: Insufficient documentation

## 2012-10-24 LAB — GLUCOSE, POCT (MANUAL RESULT ENTRY): POC Glucose: 132 mg/dl — AB (ref 70–99)

## 2012-10-24 MED ORDER — PREDNISONE 10 MG PO TABS: ORAL_TABLET | ORAL | Status: DC

## 2012-10-24 NOTE — Telephone Encounter (Signed)
[facility] went to the [facility] but CXR was not in the system. Orders put in     KP

## 2012-10-24 NOTE — Patient Instructions (Addendum)
Get a chest XR at [facility] [place], corner of [address] (10 minutes form here); they are open 24/7 [address]  [place], [place] [address] [phone] --- Increase  Symbicort 160 mg to 2 puffs twice a day Increase prednisone,  see new prescription Start Zithromax Stop vasotec   Mucinex DM twice a day Continue nebulizations and all other meds as recommended   You have an appointment with your allergist  tomorrow at 3.15 pm Discontinue Vasotec  See Dr Beverely Low  in 9 days as schedule   ER if symptoms severe

## 2012-10-24 NOTE — Assessment & Plan Note (Addendum)
Poorly controlled asthma for the last several weeks. Unclear whu this year his asthma has been worse, could be related to the new dog he acquire in the summer? Also vasotec may be making his asthma more difficult to control.  No GERD symptoms He has been taking prednisone on and off, CBG today 132, last A1c normal. Plan: Increase  Symbicort 160 mg to 2 puffs twice a day Increase prednisone, currently on 2 tablets daily: 4 tablets a day and taper down. See new prescription Chest x-ray Start Zithromax, already called in by his allergist Continue nebulizations as needed Needs to see his allergist for further eval. To be seen tomorrow at 3.15 pm Discontinue Vasotec which he is taking for kidney protection. See PCP as planned

## 2012-10-24 NOTE — Progress Notes (Signed)
  Subjective:    Patient ID: Ryan Zhang, male    DOB: Sep 12, 1946, 66 y.o.   MRN: [id number]  HPI Here for evaluation of asthma, chart is reviewed: Was seen for asthma exacerbation 07-2012, 10/04/2012 and 10/09/2012. In the 25th OV, his asthma was quite severe, he got 2 nebulizations at this office and was prescribed doxycycline. Since then he improved a little bit but was never 100% better.  Went to see his allergist 09/20/2012, Symbicort was changed from 140 two puffs twice a day to 160 one puff twice a day. He was also prescribed prednisone. Since that visit, he's not better, was quite congested last night, was "thinking about going to the ER". This afternoon he is a little better but still quite congested  Past Medical History  Diagnosis Date  . Diabetes mellitus   . Hyperlipidemia   . Hypertension   . Bipolar affective   . Allergy     allergy shots Dr. Corinda Gubler  . DJD (degenerative joint disease)   . Obesity   . Asthma   . GERD (gastroesophageal reflux disease)   . Meniere's disease   . Hollenhorst plaque     right eye  . Personal history of colonic polyps 11/2004    hyperplastic.  [name] Dysphagia   . Kidney stones   . Pancreatitis   . Hepatitis A     as teenager 60's  . Gallstones   . Motility disorder, esophageal   . Stroke     per MRI   Past Surgical History  Procedure Date  . Cholecystectomy   . Total knee arthroplasty     both knees     Review of Systems Denies any recent GERD symptoms. No fever or chills No sinus pain, mild nasal congestion and runny nose. More than SOB, he has dyspnea on exertion. Very little sputum production. He has not changed his regular medications, has not  been using any new perfume but in the summer he got a new dog. Medication list is reviewed, he was taking codeine but self discontinued because it didn't help the cough. He is taking Vasotec.     Objective:   Physical Exam General -- alert, well-developed, and overweight  appearing. No apparent distress. Vital signs stable, O2 sat 97% HEENT -- right TM normal, left TM slightly red but no bulge; throat w/o redness, face symmetric and not tender to palpation, nose not congested   Lungs --  no increased work of breathing, rhonchi and wheezing bilaterally, no crackles..   Heart-- normal rate, regular rhythm, no murmur, and no gallop.   Extremities-- no pretibial edema bilaterally  Neurologic-- alert & oriented X3 and strength normal in all extremities. Psych-- Cognition and judgment appear intact. Alert and cooperative with normal attention span and concentration.  not anxious appearing and not depressed appearing.      Assessment & Plan:

## 2012-11-02 ENCOUNTER — Encounter: Payer: Self-pay | Admitting: Family Medicine

## 2012-11-02 ENCOUNTER — Ambulatory Visit (INDEPENDENT_AMBULATORY_CARE_PROVIDER_SITE_OTHER): Payer: Medicare Other | Admitting: Family Medicine

## 2012-11-02 VITALS — BP 140/80 | HR 84 | Temp 97.8°F | Ht 69.0 in | Wt 263.0 lb

## 2012-11-02 DIAGNOSIS — E119 Type 2 diabetes mellitus without complications: Secondary | ICD-10-CM

## 2012-11-02 LAB — POCT GLYCOSYLATED HEMOGLOBIN (HGB A1C): Hemoglobin A1C: 7.2

## 2012-11-02 NOTE — Progress Notes (Signed)
  Subjective:    Patient ID: Ryan Zhang, male    DOB: [birth year], 66 y.o.   MRN: [id number]  HPI DM- chronic problem, not checking CBGs regularly.  Has been on steroids due to asthma recently.  Not exercising.  No symptomatic lows.  UTD on eye exam.  No numbness or tingling.  Compliant w/ meds.  No CP.  Some SOB due to recent asthma issues.  No HAs, visual changes, edema.  No N/V.  On Actoplus Met.   Review of Systems For ROS see HPI     Objective:   Physical Exam  Constitutional: He is oriented to person, place, and time. He appears well-developed and well-nourished. No distress.  HENT:  Head: Normocephalic and atraumatic.  Eyes: Conjunctivae normal and EOM are normal. Pupils are equal, round, and reactive to light.  Neck: Normal range of motion. Neck supple. No thyromegaly present.  Cardiovascular: Normal rate, regular rhythm, normal heart sounds and intact distal pulses.   No murmur heard. Pulmonary/Chest: Effort normal and breath sounds normal. No respiratory distress.  Abdominal: Soft. Bowel sounds are normal. He exhibits no distension.  Musculoskeletal: He exhibits no edema.  Lymphadenopathy:    He has no cervical adenopathy.  Neurological: He is alert and oriented to person, place, and time. No cranial nerve deficit.  Skin: Skin is warm and dry.  Psychiatric: He has a normal mood and affect. His behavior is normal.          Assessment & Plan:

## 2012-11-02 NOTE — Patient Instructions (Addendum)
Schedule your complete physical for 3 months Try and make healthy food choices Keep up the good work! Call with any questions or concerns Hang in there!! Happy Holidays!!!

## 2012-11-05 NOTE — Assessment & Plan Note (Signed)
Chronic problem.  A1C is elevated today compared to previous but this is likely due to recent steroid use.  Encouraged healthy, low carb diet.  Pt unable to exercise due to other comorbidities.  UTD on eye exam.  No med changes at this time.  Will continue to follow closely.

## 2012-12-11 ENCOUNTER — Telehealth: Payer: Self-pay | Admitting: Family Medicine

## 2012-12-11 MED ORDER — PIOGLITAZONE HCL-METFORMIN HCL 15-850 MG PO TABS: 1.0000 | ORAL_TABLET | Freq: Two times a day (BID) | ORAL | Status: DC

## 2012-12-11 NOTE — Telephone Encounter (Signed)
Refill: Pioglitazone-metformin 15-850 mg. Take 1 tablet by mouth 2 times daily with a meal. Qty 60. Last fill 09-14-12

## 2012-12-11 NOTE — Telephone Encounter (Signed)
Rx sent to the pharmacy by e-script.//AB/CMA 

## 2012-12-18 ENCOUNTER — Other Ambulatory Visit: Payer: Self-pay | Admitting: Family Medicine

## 2012-12-18 ENCOUNTER — Encounter: Payer: Self-pay | Admitting: Family Medicine

## 2012-12-18 MED ORDER — SIMVASTATIN 40 MG PO TABS: 40.0000 mg | ORAL_TABLET | Freq: Every day | ORAL | Status: DC

## 2012-12-18 NOTE — Telephone Encounter (Signed)
Rx sent to the pharmacy by e-script.//AB/CMA 

## 2012-12-18 NOTE — Telephone Encounter (Signed)
refill Simvastatin (Tab) 40 MG Take 1 tablet (40 mg total) by mouth at bedtime. #30 last fill 12.3013

## 2012-12-19 ENCOUNTER — Telehealth: Payer: Self-pay | Admitting: *Deleted

## 2012-12-19 NOTE — Telephone Encounter (Signed)
Last OV 11-02-12, last filled 10-14-12 #30 11 last filled by Dr Modesto Charon

## 2012-12-19 NOTE — Telephone Encounter (Signed)
Please advise on note.//AB/CMA 

## 2012-12-19 NOTE — Telephone Encounter (Signed)
Already ok'd refills

## 2012-12-20 ENCOUNTER — Other Ambulatory Visit: Payer: Self-pay | Admitting: Family Medicine

## 2012-12-20 MED ORDER — CLOPIDOGREL BISULFATE 75 MG PO TABS: 75.0000 mg | ORAL_TABLET | Freq: Every day | ORAL | Status: DC

## 2013-02-01 ENCOUNTER — Encounter: Payer: Self-pay | Admitting: Family Medicine

## 2013-02-01 ENCOUNTER — Ambulatory Visit (INDEPENDENT_AMBULATORY_CARE_PROVIDER_SITE_OTHER): Payer: Medicare Other | Admitting: Family Medicine

## 2013-02-01 VITALS — BP 120/60 | HR 92 | Temp 98.3°F | Ht 69.0 in | Wt 269.8 lb

## 2013-02-01 DIAGNOSIS — I10 Essential (primary) hypertension: Secondary | ICD-10-CM

## 2013-02-01 DIAGNOSIS — E785 Hyperlipidemia, unspecified: Secondary | ICD-10-CM

## 2013-02-01 DIAGNOSIS — Z125 Encounter for screening for malignant neoplasm of prostate: Secondary | ICD-10-CM

## 2013-02-01 DIAGNOSIS — Z0000 Encounter for general adult medical examination without abnormal findings: Secondary | ICD-10-CM

## 2013-02-01 DIAGNOSIS — E119 Type 2 diabetes mellitus without complications: Secondary | ICD-10-CM

## 2013-02-01 LAB — LIPID PANEL
Cholesterol: 100 mg/dL (ref 0–200)
HDL: 29.1 mg/dL — ABNORMAL LOW (ref >39.00)
LDL Cholesterol: 44 mg/dL (ref 0–99)
Total CHOL/HDL Ratio: 3
Triglycerides: 135 mg/dL (ref 0.0–149.0)
VLDL: 27 mg/dL (ref 0.0–40.0)

## 2013-02-01 LAB — PSA: PSA: 0.92 ng/mL (ref 0.10–4.00)

## 2013-02-01 LAB — CBC WITH DIFFERENTIAL/PLATELET
Basophils Absolute: 0 10*3/uL (ref 0.0–0.1)
Basophils Relative: 0.5 % (ref 0.0–3.0)
Eosinophils Absolute: 0.3 10*3/uL (ref 0.0–0.7)
Eosinophils Relative: 4.8 % (ref 0.0–5.0)
HCT: 37.4 % — ABNORMAL LOW (ref 39.0–52.0)
Hemoglobin: 12.3 g/dL — ABNORMAL LOW (ref 13.0–17.0)
Lymphocytes Relative: 23.4 % (ref 12.0–46.0)
Lymphs Abs: 1.3 10*3/uL (ref 0.7–4.0)
MCHC: 32.8 g/dL (ref 30.0–36.0)
MCV: 85 fl (ref 78.0–100.0)
Monocytes Absolute: 0.4 10*3/uL (ref 0.1–1.0)
Monocytes Relative: 6.8 % (ref 3.0–12.0)
Neutro Abs: 3.7 10*3/uL (ref 1.4–7.7)
Neutrophils Relative %: 64.5 % (ref 43.0–77.0)
Platelets: 255 10*3/uL (ref 150.0–400.0)
RBC: 4.4 Mil/uL (ref 4.22–5.81)
RDW: 15.5 % — ABNORMAL HIGH (ref 11.5–14.6)
WBC: 5.7 10*3/uL (ref 4.5–10.5)

## 2013-02-01 LAB — BASIC METABOLIC PANEL
BUN: 14 mg/dL (ref 6–23)
CO2: 29 mEq/L (ref 19–32)
Calcium: 8.6 mg/dL (ref 8.4–10.5)
Chloride: 105 mEq/L (ref 96–112)
Creatinine, Ser: 1 mg/dL (ref 0.4–1.5)
GFR: 84.65 mL/min (ref >60.00)
Glucose, Bld: 139 mg/dL — ABNORMAL HIGH (ref 70–99)
Potassium: 4.1 mEq/L (ref 3.5–5.1)
Sodium: 141 mEq/L (ref 135–145)

## 2013-02-01 LAB — HEPATIC FUNCTION PANEL
ALT: 15 U/L (ref 0–53)
AST: 17 U/L (ref 0–37)
Albumin: 3.6 g/dL (ref 3.5–5.2)
Alkaline Phosphatase: 87 U/L (ref 39–117)
Bilirubin, Direct: 0.2 mg/dL (ref 0.0–0.3)
Total Bilirubin: 0.9 mg/dL (ref 0.3–1.2)
Total Protein: 6.3 g/dL (ref 6.0–8.3)

## 2013-02-01 LAB — HEMOGLOBIN A1C: Hgb A1c MFr Bld: 6.7 % — ABNORMAL HIGH (ref 4.6–6.5)

## 2013-02-01 LAB — TSH: TSH: 1.29 u[IU]/mL (ref 0.35–5.50)

## 2013-02-01 NOTE — Progress Notes (Signed)
  Subjective:    Patient ID: Ryan Zhang, male    DOB: February 05, 1946, 67 y.o.   MRN: [id number]  HPI Here today for CPE.  Risk Factors: DM- chronic problem, on Actoplus Met.  UTD on eye exam.  Denies symptomatic lows.  No CP, SOB, HAs, visual changes, edema. Hyperlipidemia- chronic problem, on Zocor.  Denies abd pain, N/V, myalgias HTN- chronic problem, well controlled w/ diet and exercise.  Not currently on ACE- was previously on Enalapril but this was stopped due by Dr Drue Novel.  Asymptomatic. Physical Activity: limited exercise Fall Risk: low risk Depression: chronic problem, seeing psych regularly.  Daughter has OD'd x3 since last visit. Hearing: normal to conversational tones, decreased to whispered voice (has hearing aides) ADL's: independent Cognitive: normal linear thought process, memory and attention intact Home Safety: safe at home, lives w/ wife, has custody of grandchildren due to daughter's substance abuse Height, Weight, BMI, Visual Acuity: see vitals, vision corrected to 20/20 w/ glasses Counseling: UTD on colonoscopy Labs Ordered: See A&P Care Plan: See A&P    Review of Systems Patient reports no vision/hearing changes, anorexia, fever ,adenopathy, persistant/recurrent hoarseness, swallowing issues, chest pain, palpitations, edema, persistant/recurrent cough, hemoptysis, dyspnea (rest,exertional, paroxysmal nocturnal), gastrointestinal  bleeding (melena, rectal bleeding), abdominal pain, excessive heart burn, GU symptoms (dysuria, hematuria, voiding/incontinence issues) syncope, focal weakness, memory loss, numbness & tingling, skin/hair/nail changes, abnormal bruising/bleeding, musculoskeletal symptoms/signs.     Objective:   Physical Exam BP 120/60  Pulse 92  Temp(Src) 98.3 F (36.8 C) (Oral)  Ht 5\' 9"  (1.753 m)  Wt 269 lb 12.8 oz (122.38 kg)  BMI 39.82 kg/m2  SpO2 96%  General Appearance:    Alert, cooperative, no distress, appears stated age  Head:     Normocephalic, without obvious abnormality, atraumatic  Eyes:    PERRL, conjunctiva/corneas clear, EOM's intact, fundi    benign, both eyes       Ears:    Normal TM's and external ear canals, both ears  Nose:   Nares normal, septum midline, mucosa normal, no drainage   or sinus tenderness  Throat:   Lips, mucosa, and tongue normal; teeth and gums normal  Neck:   Supple, symmetrical, trachea midline, no adenopathy;       thyroid:  No enlargement/tenderness/nodules  Back:     Symmetric, no curvature, ROM normal, no CVA tenderness  Lungs:     Clear to auscultation bilaterally, respirations unlabored  Chest wall:    No tenderness or deformity  Heart:    Regular rate and rhythm, S1 and S2 normal, no murmur, rub   or gallop  Abdomen:     Soft, non-tender, bowel sounds active all four quadrants,    no masses, no organomegaly  Genitalia:    Normal male without lesion, discharge or tenderness  Rectal:    Normal tone, normal prostate, no masses or tenderness  Extremities:   Extremities normal, atraumatic, no cyanosis or edema  Pulses:   2+ and symmetric all extremities  Skin:   Skin color, texture, turgor normal, no rashes or lesions  Lymph nodes:   Cervical, supraclavicular, and axillary nodes normal  Neurologic:   CNII-XII intact. Normal strength, sensation and reflexes      throughout          Assessment & Plan:

## 2013-02-01 NOTE — Patient Instructions (Addendum)
Follow up in 3 months to recheck sugar We'll notify you of your lab results and make any changes if needed Call with any questions or concerns Hang in there!!!

## 2013-02-02 LAB — MICROALBUMIN / CREATININE URINE RATIO
Creatinine,U: 199.6 mg/dL
Microalb Creat Ratio: 1 mg/g (ref 0.0–30.0)
Microalb, Ur: 2 mg/dL — ABNORMAL HIGH (ref 0.0–1.9)

## 2013-02-02 MED ORDER — ENALAPRIL MALEATE 2.5 MG PO TABS: 2.5000 mg | ORAL_TABLET | Freq: Every day | ORAL | Status: DC

## 2013-02-04 NOTE — Assessment & Plan Note (Signed)
Chronic problem.  Tolerating statin w/out difficulty.  Check labs.  Adjust meds prn  

## 2013-02-04 NOTE — Assessment & Plan Note (Signed)
Pt's PE WNL w/ exception of obesity.  UTD on colonoscopy.  Check labs.  Anticipatory guidance provided.  

## 2013-02-04 NOTE — Assessment & Plan Note (Signed)
Chronic problem.  Currently well controlled off Enalapril.  Asymptomatic.  Check urine microalbumin and restart ACE prn.  Pt expressed understanding and is in agreement w/ plan.

## 2013-02-04 NOTE — Assessment & Plan Note (Signed)
Chronic problem.  UTD on eye exam.  Asymptomatic.  Check microalbumin and restart ACE prn.  Pt expressed understanding and is in agreement w/ plan.

## 2013-02-10 ENCOUNTER — Encounter: Payer: Self-pay | Admitting: Family Medicine

## 2013-02-27 ENCOUNTER — Encounter: Payer: Self-pay | Admitting: Family Medicine

## 2013-02-27 ENCOUNTER — Ambulatory Visit (INDEPENDENT_AMBULATORY_CARE_PROVIDER_SITE_OTHER): Payer: Medicare Other | Admitting: Family Medicine

## 2013-02-27 ENCOUNTER — Ambulatory Visit (HOSPITAL_BASED_OUTPATIENT_CLINIC_OR_DEPARTMENT_OTHER)
Admission: RE | Admit: 2013-02-27 | Discharge: 2013-02-27 | Disposition: A | Discharge status: Home / Self Care | Payer: Medicare Other | Source: Ambulatory Visit | Attending: Family Medicine | Admitting: Family Medicine

## 2013-02-27 VITALS — BP 120/90 | HR 77 | Temp 98.2°F | Wt 263.2 lb

## 2013-02-27 DIAGNOSIS — R0789 Other chest pain: Secondary | ICD-10-CM | POA: Insufficient documentation

## 2013-02-27 DIAGNOSIS — E119 Type 2 diabetes mellitus without complications: Secondary | ICD-10-CM | POA: Insufficient documentation

## 2013-02-27 DIAGNOSIS — R0609 Other forms of dyspnea: Secondary | ICD-10-CM

## 2013-02-27 DIAGNOSIS — R0989 Other specified symptoms and signs involving the circulatory and respiratory systems: Secondary | ICD-10-CM

## 2013-02-27 DIAGNOSIS — J45909 Unspecified asthma, uncomplicated: Secondary | ICD-10-CM | POA: Insufficient documentation

## 2013-02-27 DIAGNOSIS — I10 Essential (primary) hypertension: Secondary | ICD-10-CM | POA: Insufficient documentation

## 2013-02-27 DIAGNOSIS — R05 Cough: Secondary | ICD-10-CM | POA: Insufficient documentation

## 2013-02-27 DIAGNOSIS — R059 Cough, unspecified: Secondary | ICD-10-CM | POA: Insufficient documentation

## 2013-02-27 DIAGNOSIS — J3489 Other specified disorders of nose and nasal sinuses: Secondary | ICD-10-CM | POA: Insufficient documentation

## 2013-02-27 DIAGNOSIS — J209 Acute bronchitis, unspecified: Secondary | ICD-10-CM

## 2013-02-27 MED ORDER — AZITHROMYCIN 250 MG PO TABS: ORAL_TABLET | ORAL | Status: DC

## 2013-02-27 MED ORDER — ALBUTEROL SULFATE (2.5 MG/3ML) 0.083% IN NEBU
2.5000 mg | INHALATION_SOLUTION | Freq: Once | RESPIRATORY_TRACT | Status: AC
  Administered 2013-02-27: 2.5 mg via RESPIRATORY_TRACT

## 2013-02-27 NOTE — Progress Notes (Signed)
  Subjective:     Ryan Zhang is a 67 y.o. male here for evaluation of a cough. Onset of symptoms was 1 week ago. Symptoms have been gradually worsening since that time. The cough is productive and is aggravated by nothing. Associated symptoms include: shortness of breath, sputum production and wheezing. Patient does have a history of asthma. Patient does have a history of environmental allergens. Patient has not traveled recently. Patient does have a history of smoking. Patient has not had a previous chest x-ray. Patient has not had a PPD done.  The following portions of the patient's history were reviewed and updated as appropriate: allergies, current medications, past family history, past medical history, past social history, past surgical history and problem list.  Review of Systems Pertinent items are noted in HPI.    Objective:    Oxygen saturation 99% on room air BP 120/90  Pulse 77  Temp(Src) 98.2 F (36.8 C) (Oral)  Wt 263 lb 3.2 oz (119.387 kg)  BMI 38.85 kg/m2  SpO2 99% General appearance: alert, cooperative, appears stated age and no distress Ears: normal TM's and external ear canals both ears Nose: green discharge, moderate congestion, turbinates red, swollen, sinus tenderness bilateral Throat: lips, mucosa, and tongue normal; teeth and gums normal Neck: moderate anterior cervical adenopathy, no JVD, supple, symmetrical, trachea midline and thyroid not enlarged, symmetric, no tenderness/mass/nodules Lungs: wheezes bilaterally--cleared with albuterol neb Heart: S1, S2 normal    Assessment:    Acute Bronchitis    Plan:    Antibiotics per medication orders. Antitussives per medication orders. Avoid exposure to tobacco smoke and fumes. B-agonist inhaler. Call if shortness of breath worsens, blood in sputum, change in character of cough, development of fever or chills, inability to maintain nutrition and hydration. Avoid exposure to tobacco smoke and fumes. Steroid  inhaler as ordered.

## 2013-02-27 NOTE — Patient Instructions (Addendum)

## 2013-03-05 ENCOUNTER — Other Ambulatory Visit (HOSPITAL_COMMUNITY): Payer: Self-pay | Admitting: Gastroenterology

## 2013-03-05 NOTE — Telephone Encounter (Signed)
PATIENT WILL NEED OFFICE VISIT FOR FURTHER REFILLS  

## 2013-05-04 ENCOUNTER — Encounter: Payer: Self-pay | Admitting: Family Medicine

## 2013-05-04 ENCOUNTER — Ambulatory Visit (INDEPENDENT_AMBULATORY_CARE_PROVIDER_SITE_OTHER): Payer: Medicare Other | Admitting: Family Medicine

## 2013-05-04 VITALS — BP 100/74 | HR 81 | Temp 98.2°F | Ht 69.0 in | Wt 250.0 lb

## 2013-05-04 DIAGNOSIS — E119 Type 2 diabetes mellitus without complications: Secondary | ICD-10-CM

## 2013-05-04 DIAGNOSIS — F319 Bipolar disorder, unspecified: Secondary | ICD-10-CM

## 2013-05-04 LAB — BASIC METABOLIC PANEL
BUN: 23 mg/dL (ref 6–23)
CO2: 26 mEq/L (ref 19–32)
Calcium: 9.9 mg/dL (ref 8.4–10.5)
Chloride: 102 mEq/L (ref 96–112)
Creatinine, Ser: 1 mg/dL (ref 0.4–1.5)
GFR: 76.2 mL/min (ref >60.00)
Glucose, Bld: 151 mg/dL — ABNORMAL HIGH (ref 70–99)
Potassium: 4.7 mEq/L (ref 3.5–5.1)
Sodium: 138 mEq/L (ref 135–145)

## 2013-05-04 LAB — HEMOGLOBIN A1C: Hgb A1c MFr Bld: 6.5 % (ref 4.6–6.5)

## 2013-05-04 NOTE — Progress Notes (Signed)
  Subjective:    Patient ID: Ryan Zhang, male    DOB: 02/03/1946, 67 y.o.   MRN: [id number]  HPI DM- chronic problem, on Actoplus Met.  UTD on eye exam.  Denies CP, SOB, HAs, visual changes, edema, N/V/D.  CBGs running 'around 130 or so'.  On Enalapril for renal protection.  Depression- still struggling w/ daughter's addiction and is fighting to prevent granddaughters from going to [facility]  Daughter is stealing from him.   Review of Systems For ROS see HPI     Objective:   Physical Exam  Vitals reviewed. Constitutional: He is oriented to person, place, and time. He appears well-developed and well-nourished. No distress.  HENT:  Head: Normocephalic and atraumatic.  Eyes: Conjunctivae and EOM are normal. Pupils are equal, round, and reactive to light.  Neck: Normal range of motion. Neck supple. No thyromegaly present.  Cardiovascular: Normal rate, regular rhythm, normal heart sounds and intact distal pulses.   No murmur heard. Pulmonary/Chest: Effort normal and breath sounds normal. No respiratory distress.  Abdominal: Soft. Bowel sounds are normal. He exhibits no distension.  Musculoskeletal: He exhibits no edema.  Lymphadenopathy:    He has no cervical adenopathy.  Neurological: He is alert and oriented to person, place, and time. No cranial nerve deficit.  Skin: Skin is warm and dry.  Psychiatric: He has a normal mood and affect. His behavior is normal.          Assessment & Plan:

## 2013-05-04 NOTE — Patient Instructions (Addendum)
Follow up in 3-4 months to recheck diabetes and cholesterol We'll notify you of your lab results and make any changes if needed Call with any questions or concerns Hang in there!!!

## 2013-05-04 NOTE — Assessment & Plan Note (Signed)
Chronic problem.  Still following w/ therapist and psych.  Having difficult time w/ family situation and the idea of losing his granddaughters.  Will follow closely.

## 2013-05-04 NOTE — Assessment & Plan Note (Signed)
Chronic problem.  Typically well controlled on meds.  UTD on eye exam.  Currently asymptomatic.  Check labs.  Adjust meds prn

## 2013-05-07 ENCOUNTER — Other Ambulatory Visit (HOSPITAL_COMMUNITY): Payer: Self-pay | Admitting: Gastroenterology

## 2013-05-07 NOTE — Telephone Encounter (Signed)
Patient will need an office visit for further refills  

## 2013-06-18 ENCOUNTER — Other Ambulatory Visit: Payer: Self-pay | Admitting: Family Medicine

## 2013-06-20 NOTE — Telephone Encounter (Signed)
Rx sent to the pharmacy by e-script.//AB/CMA 

## 2013-07-17 ENCOUNTER — Other Ambulatory Visit: Payer: Self-pay | Admitting: Family Medicine

## 2013-07-17 NOTE — Telephone Encounter (Signed)
Med filled.  

## 2013-07-20 ENCOUNTER — Encounter: Payer: Self-pay | Admitting: Family Medicine

## 2013-08-03 ENCOUNTER — Ambulatory Visit (INDEPENDENT_AMBULATORY_CARE_PROVIDER_SITE_OTHER): Payer: Medicare Other | Admitting: Family Medicine

## 2013-08-03 ENCOUNTER — Encounter: Payer: Self-pay | Admitting: Family Medicine

## 2013-08-03 VITALS — BP 122/78 | HR 89 | Temp 97.8°F | Ht 69.0 in | Wt 253.2 lb

## 2013-08-03 DIAGNOSIS — E785 Hyperlipidemia, unspecified: Secondary | ICD-10-CM

## 2013-08-03 DIAGNOSIS — I10 Essential (primary) hypertension: Secondary | ICD-10-CM

## 2013-08-03 DIAGNOSIS — E119 Type 2 diabetes mellitus without complications: Secondary | ICD-10-CM

## 2013-08-03 NOTE — Assessment & Plan Note (Signed)
Chronic problem.  Adequate control.  Asymptomatic.  Check labs.  No anticipated changes. 

## 2013-08-03 NOTE — Assessment & Plan Note (Signed)
Chronic problem.  Tolerating statin w/out difficulty.  Check labs.  Adjust meds prn  

## 2013-08-03 NOTE — Assessment & Plan Note (Signed)
Chronic problem.  Typically well controlled.  Not checking sugars.  Asymptomatic.  UTD on eye exam.  Check labs.  Adjust meds prn

## 2013-08-03 NOTE — Progress Notes (Signed)
  Subjective:    Patient ID: Ryan Zhang, male    DOB: Dec 23, 1945, 67 y.o.   MRN: [id number]  HPI DM- chronic problem, on ActoplusMet.  UTD on eye exam.  Not checking.  Denies symptomatic lows.  No weakness/numbness of hands/feet.  Hyperlipidemia- chronic problem, on Simvastatin.  Denies abd pain, N/V, myalgias  HTN- chronic problem, well controlled.  Asymptomatic.  On Enalapril.  Denies CP, SOB, HAs, visual changes, edema.   Review of Systems For ROS see HPI     Objective:   Physical Exam  Vitals reviewed. Constitutional: He is oriented to person, place, and time. He appears well-developed and well-nourished. No distress.  HENT:  Head: Normocephalic and atraumatic.  Eyes: Conjunctivae and EOM are normal. Pupils are equal, round, and reactive to light.  Neck: Normal range of motion. Neck supple. No thyromegaly present.  Cardiovascular: Normal rate, regular rhythm, normal heart sounds and intact distal pulses.   No murmur heard. Pulmonary/Chest: Effort normal and breath sounds normal. No respiratory distress.  Abdominal: Soft. Bowel sounds are normal. He exhibits no distension.  Musculoskeletal: He exhibits no edema.  Lymphadenopathy:    He has no cervical adenopathy.  Neurological: He is alert and oriented to person, place, and time. No cranial nerve deficit.  Skin: Skin is warm and dry.  Psychiatric: He has a normal mood and affect. His behavior is normal.          Assessment & Plan:

## 2013-08-03 NOTE — Patient Instructions (Addendum)
Follow up in 3 months to recheck sugar We'll notify you of your lab results and make any changes if needed Keep up the good work! Hang in there!!!

## 2013-09-18 ENCOUNTER — Emergency Department (HOSPITAL_COMMUNITY): Payer: Medicare Other

## 2013-09-18 ENCOUNTER — Encounter (HOSPITAL_COMMUNITY): Payer: Self-pay | Admitting: Emergency Medicine

## 2013-09-18 ENCOUNTER — Emergency Department (HOSPITAL_COMMUNITY)
Admission: EM | Admit: 2013-09-18 | Discharge: 2013-09-18 | Disposition: A | Discharge status: Home / Self Care | Payer: Medicare Other | Attending: Emergency Medicine | Admitting: Emergency Medicine

## 2013-09-18 DIAGNOSIS — Z8619 Personal history of other infectious and parasitic diseases: Secondary | ICD-10-CM | POA: Insufficient documentation

## 2013-09-18 DIAGNOSIS — I10 Essential (primary) hypertension: Secondary | ICD-10-CM | POA: Insufficient documentation

## 2013-09-18 DIAGNOSIS — J45909 Unspecified asthma, uncomplicated: Secondary | ICD-10-CM | POA: Insufficient documentation

## 2013-09-18 DIAGNOSIS — S0990XA Unspecified injury of head, initial encounter: Secondary | ICD-10-CM | POA: Insufficient documentation

## 2013-09-18 DIAGNOSIS — Z860100 Personal history of colon polyps, unspecified: Secondary | ICD-10-CM | POA: Insufficient documentation

## 2013-09-18 DIAGNOSIS — W19XXXA Unspecified fall, initial encounter: Secondary | ICD-10-CM

## 2013-09-18 DIAGNOSIS — M1990 Unspecified osteoarthritis, unspecified site: Secondary | ICD-10-CM | POA: Insufficient documentation

## 2013-09-18 DIAGNOSIS — Z8669 Personal history of other diseases of the nervous system and sense organs: Secondary | ICD-10-CM | POA: Insufficient documentation

## 2013-09-18 DIAGNOSIS — Z87891 Personal history of nicotine dependence: Secondary | ICD-10-CM | POA: Insufficient documentation

## 2013-09-18 DIAGNOSIS — W108XXA Fall (on) (from) other stairs and steps, initial encounter: Secondary | ICD-10-CM | POA: Insufficient documentation

## 2013-09-18 DIAGNOSIS — F319 Bipolar disorder, unspecified: Secondary | ICD-10-CM | POA: Insufficient documentation

## 2013-09-18 DIAGNOSIS — Z79899 Other long term (current) drug therapy: Secondary | ICD-10-CM | POA: Insufficient documentation

## 2013-09-18 DIAGNOSIS — IMO0002 Reserved for concepts with insufficient information to code with codable children: Secondary | ICD-10-CM | POA: Insufficient documentation

## 2013-09-18 DIAGNOSIS — K219 Gastro-esophageal reflux disease without esophagitis: Secondary | ICD-10-CM | POA: Insufficient documentation

## 2013-09-18 DIAGNOSIS — W010XXA Fall on same level from slipping, tripping and stumbling without subsequent striking against object, initial encounter: Secondary | ICD-10-CM | POA: Insufficient documentation

## 2013-09-18 DIAGNOSIS — Z8673 Personal history of transient ischemic attack (TIA), and cerebral infarction without residual deficits: Secondary | ICD-10-CM | POA: Insufficient documentation

## 2013-09-18 DIAGNOSIS — Y9301 Activity, walking, marching and hiking: Secondary | ICD-10-CM | POA: Insufficient documentation

## 2013-09-18 DIAGNOSIS — E669 Obesity, unspecified: Secondary | ICD-10-CM | POA: Insufficient documentation

## 2013-09-18 DIAGNOSIS — E119 Type 2 diabetes mellitus without complications: Secondary | ICD-10-CM | POA: Insufficient documentation

## 2013-09-18 DIAGNOSIS — Z87442 Personal history of urinary calculi: Secondary | ICD-10-CM | POA: Insufficient documentation

## 2013-09-18 DIAGNOSIS — Z7902 Long term (current) use of antithrombotics/antiplatelets: Secondary | ICD-10-CM | POA: Insufficient documentation

## 2013-09-18 DIAGNOSIS — Y92009 Unspecified place in unspecified non-institutional (private) residence as the place of occurrence of the external cause: Secondary | ICD-10-CM | POA: Insufficient documentation

## 2013-09-18 DIAGNOSIS — E785 Hyperlipidemia, unspecified: Secondary | ICD-10-CM | POA: Insufficient documentation

## 2013-09-18 MED ORDER — ACETAMINOPHEN 325 MG PO TABS
650.0000 mg | ORAL_TABLET | ORAL | Status: AC
  Administered 2013-09-18: 650 mg via ORAL
  Filled 2013-09-18: qty 2

## 2013-09-18 NOTE — ED Notes (Signed)
Pt reports "fuzzy thinking, but that's normal for me".

## 2013-09-18 NOTE — ED Notes (Signed)
Patient transported to CT 

## 2013-09-18 NOTE — ED Notes (Addendum)
Pt from home. Was walking up stairs and slipped backwards on step 2/3. Fell backwards and hit head. Pt alert and oriented x 4, somewhat resistant in answering questions, per EMS, and does have a psych history. Pt neuro intact. Pt complaining of shoulder pain, knee pain, and feet tingling. Pt denies LOC, N/V, and SOB. Pt is on Plavix.

## 2013-09-18 NOTE — ED Provider Notes (Signed)
CSN: [id number]     Arrival date & time 09/18/13  1556 History   First MD Initiated Contact with Patient 09/18/13 1641     Chief Complaint  Patient presents with  . Fall   (Consider location/radiation/quality/duration/timing/severity/associated sxs/prior Treatment) Patient is a 67 y.o. male presenting with fall. The history is provided by the patient.  Fall   patient here complaining of head and neck pain after tripping while going up the stairs. No loss of consciousness. Denies any weakness in his arms. No lower back pain. No chest or abdominal pain. No symptoms prior to the incident. EMS was called by his wife and patient transported here for in C-spine precautions. Pain is localized to the front of his head characterized as sharp. Neck pain is in the midline and nonradiating and worse with movement. No treatment used prior to arrival  Past Medical History  Diagnosis Date  . Diabetes mellitus   . Hyperlipidemia   . Hypertension   . Bipolar affective   . Allergy     allergy shots Dr. Corinda Gubler  . DJD (degenerative joint disease)   . Obesity   . Asthma   . GERD (gastroesophageal reflux disease)   . Meniere's disease   . Hollenhorst plaque     right eye  . Personal history of colonic polyps 11/2004    hyperplastic.  [name] Dysphagia   . Kidney stones   . Pancreatitis   . Hepatitis A     as teenager 60's  . Gallstones   . Motility disorder, esophageal   . Stroke     per MRI   Past Surgical History  Procedure Laterality Date  . Cholecystectomy    . Total knee arthroplasty      both knees   Family History  Problem Relation Age of Onset  . Diabetes Father   . Heart disease Maternal Grandmother   . Colon cancer Neg Hx   . Cancer Sister     unknown type   History  Substance Use Topics  . Smoking status: Former Smoker    Quit date: 11/15/1964  . Smokeless tobacco: Never Used  . Alcohol Use: No    Review of Systems  All other systems reviewed and are  negative.    Allergies  Citalopram hydrobromide; Divalproex sodium; Methocarbamol; Other; Oxycodone-acetaminophen; Paroxetine; Percocet; Risperidone; Smz-tmp ds; and Wellbutrin  Home Medications   Current Outpatient Rx  Name  Route  Sig  Dispense  Refill  . asenapine (SAPHRIS) 5 MG SUBL   Sublingual   Place 5 mg under the tongue at bedtime.          . benztropine (COGENTIN) 2 MG tablet   Oral   Take 2 mg by mouth every evening.          . budesonide-formoterol (SYMBICORT) 160-4.5 MCG/ACT inhaler   Inhalation   Inhale 2 puffs into the lungs daily.          . clonazePAM (KLONOPIN) 0.5 MG tablet   Oral   Take 0.5 mg by mouth 4 (four) times daily.          . clopidogrel (PLAVIX) 75 MG tablet   Oral   Take 75 mg by mouth every evening.         . DULoxetine (CYMBALTA) 60 MG capsule   Oral   Take 60 mg by mouth every morning.          . enalapril (VASOTEC) 2.5 MG tablet   Oral   Take 2.5  mg by mouth every evening.         [name] esomeprazole (NEXIUM) 40 MG capsule   Oral   Take 40 mg by mouth every evening.         . fexofenadine (ALLEGRA) 180 MG tablet   Oral   Take 180 mg by mouth every morning.          . fluticasone (VERAMYST) 27.5 MCG/SPRAY nasal spray   Nasal   Place 1 spray into the nose daily as needed for rhinitis.         [name] gabapentin (NEURONTIN) 300 MG capsule   Oral   Take 300 mg by mouth 4 (four) times daily.          . LamoTRIgine (LAMICTAL XR) 50 MG TB24   Oral   Take 50 mg by mouth every evening.          . meclizine (ANTIVERT) 25 MG tablet   Oral   Take 25 mg by mouth 3 (three) times daily as needed for dizziness.         . Multiple Vitamins-Minerals ([employer] SPECTRAVITE SENIOR) TABS   Oral   Take 1 tablet by mouth every morning.          . naproxen (EC-NAPROSYN) 500 MG EC tablet   Oral   Take 500 mg by mouth 2 (two) times daily as needed.          . pioglitazone-metformin (ACTOPLUS MET) 15-850 MG per tablet       TAKE 1 TABLET BY MOUTH 2 TIMES DAILY WITH MEALS   60 tablet   5   . simvastatin (ZOCOR) 40 MG tablet      TAKE 1 TABLET BY MOUTH ATBEDTIME.   30 tablet   5   . traZODone (DESYREL) 50 MG tablet   Oral   Take 50 mg by mouth at bedtime as needed for sleep.          BP 143/81  Pulse 73  Temp(Src) 97.5 F (36.4 C) (Oral)  Resp 16  SpO2 100% Physical Exam  Nursing note and vitals reviewed. Constitutional: He is oriented to person, place, and time. He appears well-developed and well-nourished.  Non-toxic appearance. No distress.  HENT:  Head: Normocephalic and atraumatic.  Eyes: Conjunctivae, EOM and lids are normal. Pupils are equal, round, and reactive to light.  Neck: Normal range of motion. Neck supple. No tracheal deviation present. No mass present.  Cardiovascular: Normal rate, regular rhythm and normal heart sounds.  Exam reveals no gallop.   No murmur heard. Pulmonary/Chest: Effort normal and breath sounds normal. No stridor. No respiratory distress. He has no decreased breath sounds. He has no wheezes. He has no rhonchi. He has no rales.  Abdominal: Soft. Normal appearance and bowel sounds are normal. He exhibits no distension. There is no tenderness. There is no rebound and no CVA tenderness.  Musculoskeletal: Normal range of motion. He exhibits no edema and no tenderness.  Neurological: He is alert and oriented to person, place, and time. He has normal strength. No cranial nerve deficit or sensory deficit. GCS eye subscore is 4. GCS verbal subscore is 5. GCS motor subscore is 6.  Skin: Skin is warm and dry. No abrasion and no rash noted.  Psychiatric: He has a normal mood and affect. His speech is normal and behavior is normal.    ED Course  Procedures (including critical care time) Labs Review Labs Reviewed - No data to display Imaging Review Ct Head  Wo Contrast  09/18/2013   CLINICAL DATA:  Status post fall with head injury. Possible mild altered mental status.   EXAM: CT HEAD WITHOUT CONTRAST  CT CERVICAL SPINE WITHOUT CONTRAST  TECHNIQUE: Multidetector CT imaging of the head and cervical spine was performed following the standard protocol without intravenous contrast. Multiplanar CT image reconstructions of the cervical spine were also generated.  COMPARISON:  Head CT and CT angiography of the head and neck 12/17/2011.  FINDINGS: CT HEAD FINDINGS  There is no evidence of acute intracranial hemorrhage, mass lesion, brain edema or extra-axial fluid collection. The ventricles and subarachnoid spaces are appropriately sized for age. There is no CT evidence of acute cortical infarction. Intracranial vascular calcifications are noted.  The visualized paranasal sinuses, mastoid air cells and middle ears are clear. The calvarium is intact.  CT CERVICAL SPINE FINDINGS  The cervical alignment is normal. There is no evidence of acute fracture or traumatic subluxation. There is multilevel spondylosis with disc space loss and uncinate spurring. No acute soft tissue findings are identified. The lung apices are clear.  IMPRESSION: 1. No acute intracranial or cervical spine findings demonstrated. 2. Multilevel cervical spondylosis appears grossly unchanged.   Electronically Signed   By: Roxy Horseman M.D.   On: 09/18/2013 17:56   Ct Cervical Spine Wo Contrast  09/18/2013   CLINICAL DATA:  Status post fall with head injury. Possible mild altered mental status.  EXAM: CT HEAD WITHOUT CONTRAST  CT CERVICAL SPINE WITHOUT CONTRAST  TECHNIQUE: Multidetector CT imaging of the head and cervical spine was performed following the standard protocol without intravenous contrast. Multiplanar CT image reconstructions of the cervical spine were also generated.  COMPARISON:  Head CT and CT angiography of the head and neck 12/17/2011.  FINDINGS: CT HEAD FINDINGS  There is no evidence of acute intracranial hemorrhage, mass lesion, brain edema or extra-axial fluid collection. The ventricles and  subarachnoid spaces are appropriately sized for age. There is no CT evidence of acute cortical infarction. Intracranial vascular calcifications are noted.  The visualized paranasal sinuses, mastoid air cells and middle ears are clear. The calvarium is intact.  CT CERVICAL SPINE FINDINGS  The cervical alignment is normal. There is no evidence of acute fracture or traumatic subluxation. There is multilevel spondylosis with disc space loss and uncinate spurring. No acute soft tissue findings are identified. The lung apices are clear.  IMPRESSION: 1. No acute intracranial or cervical spine findings demonstrated. 2. Multilevel cervical spondylosis appears grossly unchanged.   Electronically Signed   By: Roxy Horseman M.D.   On: 09/18/2013 17:56    EKG Interpretation     Ventricular Rate:  75 PR Interval:  166 QRS Duration: 110 QT Interval:  414 QTC Calculation: 462 R Axis:   51 Text Interpretation:  Sinus rhythm Borderline low voltage, extremity leads Abnormal R-wave progression, early transition No significant change since last tracing            MDM  No diagnosis found.  pts xrays without acute findings--stable for d/c  Toy Baker, MD 09/18/13 [phone]

## 2013-09-20 ENCOUNTER — Other Ambulatory Visit: Payer: Self-pay

## 2013-09-25 ENCOUNTER — Encounter: Payer: Self-pay | Admitting: Family Medicine

## 2013-09-25 ENCOUNTER — Ambulatory Visit (INDEPENDENT_AMBULATORY_CARE_PROVIDER_SITE_OTHER): Payer: Medicare Other | Admitting: Family Medicine

## 2013-09-25 VITALS — BP 130/80 | HR 80 | Temp 98.2°F | Resp 16 | Wt 253.5 lb

## 2013-09-25 DIAGNOSIS — E785 Hyperlipidemia, unspecified: Secondary | ICD-10-CM

## 2013-09-25 DIAGNOSIS — E119 Type 2 diabetes mellitus without complications: Secondary | ICD-10-CM

## 2013-09-25 DIAGNOSIS — Z23 Encounter for immunization: Secondary | ICD-10-CM

## 2013-09-25 DIAGNOSIS — R55 Syncope and collapse: Secondary | ICD-10-CM

## 2013-09-25 LAB — CBC WITH DIFFERENTIAL/PLATELET
Basophils Absolute: 0 10*3/uL (ref 0.0–0.1)
Basophils Relative: 0.5 % (ref 0.0–3.0)
Eosinophils Absolute: 0.2 10*3/uL (ref 0.0–0.7)
Eosinophils Relative: 3.4 % (ref 0.0–5.0)
HCT: 38.2 % — ABNORMAL LOW (ref 39.0–52.0)
Hemoglobin: 12.7 g/dL — ABNORMAL LOW (ref 13.0–17.0)
Lymphocytes Relative: 29.2 % (ref 12.0–46.0)
Lymphs Abs: 1.7 10*3/uL (ref 0.7–4.0)
MCHC: 33.2 g/dL (ref 30.0–36.0)
MCV: 82.4 fl (ref 78.0–100.0)
Monocytes Absolute: 0.4 10*3/uL (ref 0.1–1.0)
Monocytes Relative: 6.7 % (ref 3.0–12.0)
Neutro Abs: 3.5 10*3/uL (ref 1.4–7.7)
Neutrophils Relative %: 60.2 % (ref 43.0–77.0)
Platelets: 232 10*3/uL (ref 150.0–400.0)
RBC: 4.63 Mil/uL (ref 4.22–5.81)
RDW: 15.3 % — ABNORMAL HIGH (ref 11.5–14.6)
WBC: 5.8 10*3/uL (ref 4.5–10.5)

## 2013-09-25 LAB — BASIC METABOLIC PANEL
BUN: 21 mg/dL (ref 6–23)
CO2: 27 mEq/L (ref 19–32)
Calcium: 9.1 mg/dL (ref 8.4–10.5)
Chloride: 105 mEq/L (ref 96–112)
Creatinine, Ser: 1 mg/dL (ref 0.4–1.5)
GFR: 83.47 mL/min (ref >60.00)
Glucose, Bld: 165 mg/dL — ABNORMAL HIGH (ref 70–99)
Potassium: 3.7 mEq/L (ref 3.5–5.1)
Sodium: 140 mEq/L (ref 135–145)

## 2013-09-25 LAB — HEPATIC FUNCTION PANEL
ALT: 15 U/L (ref 0–53)
AST: 18 U/L (ref 0–37)
Albumin: 3.7 g/dL (ref 3.5–5.2)
Alkaline Phosphatase: 72 U/L (ref 39–117)
Bilirubin, Direct: 0.1 mg/dL (ref 0.0–0.3)
Total Bilirubin: 1.3 mg/dL — ABNORMAL HIGH (ref 0.3–1.2)
Total Protein: 6.7 g/dL (ref 6.0–8.3)

## 2013-09-25 LAB — LIPID PANEL
Cholesterol: 144 mg/dL (ref 0–200)
HDL: 39.3 mg/dL (ref >39.00)
LDL Cholesterol: 84 mg/dL (ref 0–99)
Total CHOL/HDL Ratio: 4
Triglycerides: 105 mg/dL (ref 0.0–149.0)
VLDL: 21 mg/dL (ref 0.0–40.0)

## 2013-09-25 LAB — HEMOGLOBIN A1C: Hgb A1c MFr Bld: 6.5 % (ref 4.6–6.5)

## 2013-09-25 MED ORDER — NAPROXEN 500 MG PO TABS: 500.0000 mg | ORAL_TABLET | Freq: Two times a day (BID) | ORAL | Status: DC

## 2013-09-25 NOTE — Assessment & Plan Note (Signed)
Check labs as pt left prior to getting labs drawn last visit.  Adjust meds prn.

## 2013-09-25 NOTE — Patient Instructions (Signed)
Follow up in 3 months for diabetes We'll notify you of your lab results and make any changes if needed We'll call you with your neuro appt Limit your driving to necessary trips Start the Naproxen twice daily- w/ food- for hip pain Ice or heat for pain relief Call with any questions or concerns Hang in there!!!

## 2013-09-25 NOTE — Assessment & Plan Note (Signed)
It appears that pt had a 2nd unprovoked episode.  Pt can't give any information surrounding the circumstances of his fall other than to say he was talking 'jibberish'.  Syncope vs seizure vs other event.  Pt is no longer following w/ neuro- will re-refer.  Strongly advised pt not to drive until additional work up done.  Will follow.

## 2013-09-25 NOTE — Progress Notes (Signed)
  Subjective:    Patient ID: Ryan Zhang, male    DOB: 17-Mar-1946, 67 y.o.   MRN: [id number]  HPI ER f/u- fell on 11/4, unaware of circumstances surrounding fall.  Pt reports after falling was 'talking jibberish'.  Has no memory between falling and EMS arrival.  Wife did not report shaking/twitching.  Was seeing Dr Modesto Charon (neuro) for similar 'black out' episode.  No tongue biting or bowel/bladder incontinence.  Now having R hip pain.  Pt reports he is having vertigo w/ position change- turning head when lying, sitting from lying.  No dizziness when sitting.  Has hx of meniere's disease.  No recent med changes.  Denies changes w/ memory.  DM- had visit in September but did not get labs drawn.  Pt reports walking out prior to receiving flu shot and AVS.   Review of Systems For ROS see HPI     Objective:   Physical Exam  Vitals reviewed. Constitutional: He is oriented to person, place, and time. He appears well-developed and well-nourished. No distress.  HENT:  Head: Normocephalic and atraumatic.  Neck: Normal range of motion. Neck supple. No thyromegaly present.  Cardiovascular: Normal rate, regular rhythm, normal heart sounds and intact distal pulses.   Pulmonary/Chest: Effort normal and breath sounds normal. No respiratory distress. He has no wheezes. He has no rales.  Musculoskeletal: Normal range of motion. He exhibits tenderness (mild TTP over R lateral thigh/hip). He exhibits no edema.  Lymphadenopathy:    He has no cervical adenopathy.  Neurological: He is alert and oriented to person, place, and time.  Skin: Skin is warm and dry.          Assessment & Plan:

## 2013-09-25 NOTE — Assessment & Plan Note (Signed)
Chronic problem.  Check labs b/c pt left prior to having labs drawn at last visit.

## 2013-09-26 ENCOUNTER — Ambulatory Visit: Payer: Self-pay | Admitting: Family Medicine

## 2013-09-27 ENCOUNTER — Encounter: Payer: Self-pay | Admitting: Family Medicine

## 2013-09-28 NOTE — Telephone Encounter (Signed)
Please advise.//AB/CMA 

## 2013-10-01 ENCOUNTER — Ambulatory Visit (INDEPENDENT_AMBULATORY_CARE_PROVIDER_SITE_OTHER): Payer: Medicare Other | Admitting: Neurology

## 2013-10-01 ENCOUNTER — Encounter: Payer: Self-pay | Admitting: Neurology

## 2013-10-01 VITALS — BP 130/72 | HR 65 | Temp 97.9°F | Resp 12 | Ht 70.0 in | Wt 258.0 lb

## 2013-10-01 DIAGNOSIS — R2681 Unsteadiness on feet: Secondary | ICD-10-CM

## 2013-10-01 DIAGNOSIS — E1149 Type 2 diabetes mellitus with other diabetic neurological complication: Secondary | ICD-10-CM

## 2013-10-01 DIAGNOSIS — R269 Unspecified abnormalities of gait and mobility: Secondary | ICD-10-CM

## 2013-10-01 DIAGNOSIS — G238 Other specified degenerative diseases of basal ganglia: Secondary | ICD-10-CM

## 2013-10-01 DIAGNOSIS — R279 Unspecified lack of coordination: Secondary | ICD-10-CM

## 2013-10-01 DIAGNOSIS — E1140 Type 2 diabetes mellitus with diabetic neuropathy, unspecified: Secondary | ICD-10-CM

## 2013-10-01 DIAGNOSIS — R270 Ataxia, unspecified: Secondary | ICD-10-CM

## 2013-10-01 DIAGNOSIS — E1142 Type 2 diabetes mellitus with diabetic polyneuropathy: Secondary | ICD-10-CM

## 2013-10-01 DIAGNOSIS — R55 Syncope and collapse: Secondary | ICD-10-CM

## 2013-10-01 NOTE — Progress Notes (Signed)
Ryan Zhang was seen today in neurologic consultation at the request of Neena Rhymes, MD.  The patient is accompanied by his wife who supplements the history.  The consultation is for the evaluation of "blackout spells."  He is accompanied by his wife who supplements the history.  I reviewed the previous notes.  I also reviewed Dr. Nash Dimmer previous notes.  Dr. Modesto Charon saw the patient over a year ago for transient bilateral vision loss.  There was 60% stenosis of the left vertebral, but he did not necessarily think that that contributed to his event.  His aspirin was changed to Plavix.  He had negative acetylcholine receptor antibodies.  His MRI suggested a possible hot cross bun sign.  Dr. Modesto Charon thought could be followed clinically for the development of any neurodegenerative process.  The patient subsequently went to the emergency room on 09/18/2013 for a fall.  ED notes indicate that the pt fell after tripping up stairs, without LOC.  The pt states that he doesn't remember what happened.  He remembers talking to his wife and went in the house and his wife heard a thud.  She found him at the bottom of the steps with legs up on the stairs.  His glasses were off.  She asked the pt if he was okay and he moaned.  She asked him what his first name was and he knew, but he did not know his last name or where he was.  His speech was not intelligible.  It took 10 min for his speech to become more normal.  His wife reports that he went with EMS and by the time that she saw him 4 hours later at the hospital, he was normal.  He does remember some of the ambulance ride.  He denies anything similar to this.  The episode for which he saw Dr. Modesto Charon was very different than this.  He does admit to significant fearfulness with falling backward and he is very careful to hold handrails so feels that he was holding the handrail.  He has had vertigo for a long time.  He states that he spins but when he stands up he is off balance.   He has no tremor.  He states that he went to balance therapy about a year ago.  He denies voice change.  He admits to some memory change.  He had ECT in 2010 and had significant memory change after that, but it did get somewhat better but never back to baseline.  He admits to swallowing changes with solids.  He denies double vision.  He denies ptosis.  He has trouble keeping the eyelids open ("my wife gets mad a me for closing the eyes all the time").  It has been going on for years.   He is on Saphris and has been on that for 2-3 years.  He was previously on Geodon, Risperdal and several others (cannot remember the name of them all).     Neuroimaging has previously been performed.  It is available for my review today.  I agree with Dr. Modesto Charon that there appears to be a hot cross bun sign.  MRI brain 12/09/2011:  Clinical Data: 67 year old male with blackouts episodes. 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  Comparison: Head CT without contrast 03/07/2010.  MRI HEAD WITHOUT CONTRAST  Technique: Multiplanar, multiecho pulse sequences of the brain and  surrounding structures were obtained according to standard protocol  without intravenous contrast.  Findings: No  restricted diffusion to suggest acute infarction. No  midline shift, mass effect, evidence of mass lesion,  ventriculomegaly, extra-axial collection or acute intracranial  hemorrhage. Cervicomedullary junction and pituitary are within  normal limits. Major intracranial vascular flow voids are  preserved; dominant distal left vertebral artery.  Cruciform T2 and FLAIR hyperintensity in the pons. Probable small  chronic lacunar infarct in the left dorsal pons and series 5 image  6. Mild for age nonspecific cerebral white matter T2 and FLAIR  hyperintensity. Possible chronic blood products in the left  caudate nucleus, otherwise negative deep gray matter nuclei. No  cortical encephalomalacia. Negative visualized cervical spine.    Visualized bone marrow signal is within normal limits.  Visualized orbit soft tissues are within normal limits. Visualized  paranasal sinuses and mastoids are clear. Negative visualized  scalp soft tissues.  IMPRESSION:  1. No acute intracranial abnormality.  2. Signal changes in the brain stem are nonspecific, but favor  small vessel disease in this setting.   PREVIOUS MEDICATIONS: Not applicable  ALLERGIES:   Allergies  Allergen Reactions  . Citalopram Hydrobromide     insomnia  . Divalproex Sodium     Pancreatitis   . Methocarbamol     hallucinations  . Other     "Symbrax" blood sugar  . Oxycodone-Acetaminophen   . Paroxetine     insomnia  . Percocet [Oxycodone-Acetaminophen]     Reversed response  . Risperidone     REACTION: hyper  . Smz-Tmp Ds [Sulfamethoxazole-Trimethoprim] Nausea And Vomiting  . Wellbutrin [Bupropion]     Hot flashes    CURRENT MEDICATIONS:  Current Outpatient Prescriptions on File Prior to Visit  Medication Sig Dispense Refill  . asenapine (SAPHRIS) 5 MG SUBL Place 5 mg under the tongue at bedtime.       . benztropine (COGENTIN) 2 MG tablet Take 2 mg by mouth every evening.       . budesonide-formoterol (SYMBICORT) 160-4.5 MCG/ACT inhaler Inhale 2 puffs into the lungs daily.       . clonazePAM (KLONOPIN) 0.5 MG tablet Take 0.5 mg by mouth 4 (four) times daily.       . clopidogrel (PLAVIX) 75 MG tablet Take 75 mg by mouth every evening.      . DULoxetine (CYMBALTA) 60 MG capsule Take 60 mg by mouth every morning.       . enalapril (VASOTEC) 2.5 MG tablet Take 2.5 mg by mouth every evening.      [name] esomeprazole (NEXIUM) 40 MG capsule Take 40 mg by mouth every evening.      . fexofenadine (ALLEGRA) 180 MG tablet Take 180 mg by mouth every morning.       . fluticasone (VERAMYST) 27.5 MCG/SPRAY nasal spray Place 1 spray into the nose daily as needed for rhinitis.      [name] gabapentin (NEURONTIN) 300 MG capsule Take 300 mg by mouth 4 (four) times  daily.       . LamoTRIgine (LAMICTAL XR) 50 MG TB24 Take 50 mg by mouth every evening.       . meclizine (ANTIVERT) 25 MG tablet Take 25 mg by mouth 3 (three) times daily as needed for dizziness.      . Multiple Vitamins-Minerals ([employer] SPECTRAVITE SENIOR) TABS Take 1 tablet by mouth every morning.       . naproxen (NAPROSYN) 500 MG tablet Take 1 tablet (500 mg total) by mouth 2 (two) times daily with a meal.  60 tablet  1  . pioglitazone-metformin (ACTOPLUS  MET) 15-850 MG per tablet TAKE 1 TABLET BY MOUTH 2 TIMES DAILY WITH MEALS  60 tablet  5  . simvastatin (ZOCOR) 40 MG tablet TAKE 1 TABLET BY MOUTH ATBEDTIME.  30 tablet  5  . traZODone (DESYREL) 50 MG tablet Take 50 mg by mouth at bedtime as needed for sleep.       No current facility-administered medications on file prior to visit.    PAST MEDICAL HISTORY:   Past Medical History  Diagnosis Date  . Diabetes mellitus   . Hyperlipidemia   . Hypertension   . Bipolar affective   . Allergy     allergy shots Dr. Corinda Gubler  . DJD (degenerative joint disease)   . Obesity   . Asthma   . GERD (gastroesophageal reflux disease)   . Meniere's disease   . Hollenhorst plaque     right eye  . Personal history of colonic polyps 11/2004    hyperplastic.  [name] Dysphagia   . Kidney stones   . Pancreatitis   . Hepatitis A     as teenager 60's  . Gallstones   . Motility disorder, esophageal   . Stroke     per MRI    PAST SURGICAL HISTORY:   Past Surgical History  Procedure Laterality Date  . Cholecystectomy    . Total knee arthroplasty      both knees    SOCIAL HISTORY:   History   Social History  . Marital Status: Married    Spouse Name: N/A    Number of Children: 1  . Years of Education: N/A   Occupational History  . disabled     laboratory computing/programming   Social History Main Topics  . Smoking status: Former Smoker    Quit date: 11/15/1964  . Smokeless tobacco: Never Used  . Alcohol Use: No  . Drug Use: No  .  Sexual Activity: Not on file   Other Topics Concern  . Not on file   Social History Narrative  . No narrative on file    FAMILY HISTORY:   Family Status  Relation Status Death Age  . Mother Deceased     CA, breast  . Father Deceased 58    "old age"  . Sister Deceased     CA  . Brother Alive     healthy  . Brother Alive     depression, obesity  . Child Alive     adopted, drug addiction    ROS:  A complete 10 system review of systems was obtained and was unremarkable apart from what is mentioned above.  PHYSICAL EXAMINATION:    VITALS:   Filed Vitals:   10/01/13 0827  BP: 130/72  Pulse: 65  Temp: 97.9 F (36.6 C)  Resp: 12  Height: 5\' 10"  (1.778 m)  Weight: 258 lb (117.028 kg)    GEN:  Normal appears male in no acute distress.  Appears stated age. HEENT:  Normocephalic, atraumatic. The mucous membranes are moist. The superficial temporal arteries are without ropiness or tenderness. Cardiovascular: Regular rate and rhythm. Lungs: Clear to auscultation bilaterally. Neck/Heme: There are no carotid bruits noted bilaterally.  NEUROLOGICAL: Orientation:  The patient is alert and oriented x 3.  Fund of knowledge is appropriate.  Recent and remote memory intact.  Attention span and concentration normal.  Repeats and names without difficulty. Cranial nerves: There is good facial symmetry.  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  He speaks with his eyes closed much of the  time.  The pupils are equal round and reactive to light bilaterally. Fundoscopic exam reveals clear disc margins bilaterally. Extraocular muscles are intact and visual fields are full to confrontational testing. Speech is fluent and clear. Soft palate rises symmetrically and there is no tongue deviation. Hearing is intact to conversational tone. Tone: Tone is good throughout.  There is no increase in tone. Sensation: Sensation is intact to light touch and pinprick throughout (facial, trunk, extremities).  Vibration is decreased distally. There is no extinction with double simultaneous stimulation. There is no sensory dermatomal level identified. Coordination:  The patient has no difficulty with RAM's or FNF bilaterally. Motor: He is somewhat apraxic when asked to perform various motor/coordination commands.  Strength is 5/5 in the bilateral upper and lower extremities.  Shoulder shrug is equal and symmetric. There is no pronator drift.  There are no fasciculations noted. DTR's: Deep tendon reflexes are 1/4 at the bilateral biceps, triceps, brachioradialis, patella and absent at the bilateral achilles.  Plantar responses are downgoing bilaterally. Gait and Station: The patient has some trouble getting out of a chair without use of his hands.  He is mildly ataxic and short steps.  He has a positive pull test.   IMPRESSION/PLAN  1. Syncope.  -It does sound like his most recent episode is more than a fall, and it involves some type of loss of consciousness.  There was certainly alteration in consciousness.  I do think we need to rule out seizure, but this could also be a reactive event due to a cardiac abnormality.  Unfortunately, if it turns out to be an epileptic event, he cannot increase the Lamictal because he was not able to tolerate higher dosages when the psychiatrist to tried it.  An ambulatory EEG will be ordered 2. while I suspect that the ataxia, falls, eyelid opening apraxia and vertigo could all be a manifestation of MSA-C, these could also be from long-term antipsychotic use, including the Saphris he is currently on.  He does not feel that he could change this medication.  We talked about others, including quetiapine and Clozaril, but ultimately just decided to try and order a DaT scan. 3.  I do see physical exam evidence of a diabetic peripheral neuropathy.  We talked about safety. 4.  I will see him back in followup after the above has been completed.  -I. am going to refer him [facility] for physical therapy regarding gait/balance training.

## 2013-10-01 NOTE — Patient Instructions (Addendum)
1.  The cpt code if you want to contact your insurance about the DaT/SPECT scan is 98607 2.  Bring me a CD from [facility] after you have had the scan done 3.  For now, I don't want you driving.

## 2013-10-03 ENCOUNTER — Ambulatory Visit (HOSPITAL_COMMUNITY)
Admission: RE | Admit: 2013-10-03 | Discharge: 2013-10-03 | Disposition: A | Discharge status: Home / Self Care | Payer: Medicare Other | Source: Ambulatory Visit | Attending: Neurology | Admitting: Neurology

## 2013-10-03 DIAGNOSIS — R55 Syncope and collapse: Secondary | ICD-10-CM

## 2013-10-03 DIAGNOSIS — R404 Transient alteration of awareness: Secondary | ICD-10-CM | POA: Insufficient documentation

## 2013-10-03 NOTE — Progress Notes (Signed)
24hr Ambluatory EEG started.

## 2013-10-04 NOTE — Procedures (Signed)
TECHNICAL SUMMARY:  A 24 hour ambulatory EEG was performed using an 18 channel referential and bipolar montage with the standard international 10-20 system for placement.   The dominant background activity consists of 9-10 hertz activity seen most prominantly over the anterior head region.  The backgound activity is reactive to eye opening and closing procedures.  Low voltage fast (beta) activity is distributed symmetrically and maximally over the anterior head regions.  EPILEPTIFORM ACTIVITY:  There were no spikes, sharp waves or paroxysmal activity.  SLEEP:  Much of the recording is spent in physiologic drowsiness and sleep.  No abnormal activity was noted.  CARDIAC:  The EKG lead revealed a regular sinus rhythm.  IMPRESSION:  This is a normal 24 hour ambulatory EEG for the patients stated age.  There were no focal, hemispheric or lateralizing features.  No epileptiform activity was recorded.

## 2013-10-05 ENCOUNTER — Telehealth: Payer: Self-pay | Admitting: *Deleted

## 2013-10-05 ENCOUNTER — Telehealth: Payer: Self-pay

## 2013-10-05 NOTE — Telephone Encounter (Signed)
Message copied by Lou Cal on Fri Oct 05, 2013 10:13 AM ------      Message from: Benay Spice      Created: Thu Oct 04, 2013  4:33 PM                   ----- Message -----         From: Octaviano Batty Tat, DO         Sent: 10/04/2013   1:48 PM           To: Benay Spice, RN            Please let pt know that his ambulatory EEG was normal ------

## 2013-10-05 NOTE — Telephone Encounter (Signed)
Called pt and relayed your message. 

## 2013-10-05 NOTE — Telephone Encounter (Signed)
10/05/2013  Received assessment request in mail from the 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  Attached billing form, put in Sandra's folder.  bw

## 2013-10-06 ENCOUNTER — Emergency Department (HOSPITAL_COMMUNITY)
Admission: EM | Admit: 2013-10-06 | Discharge: 2013-10-06 | Disposition: A | Discharge status: Home / Self Care | Payer: Medicare Other | Attending: Emergency Medicine | Admitting: Emergency Medicine

## 2013-10-06 ENCOUNTER — Emergency Department (HOSPITAL_COMMUNITY): Payer: Medicare Other

## 2013-10-06 ENCOUNTER — Encounter (HOSPITAL_COMMUNITY): Payer: Self-pay | Admitting: Emergency Medicine

## 2013-10-06 DIAGNOSIS — S59909A Unspecified injury of unspecified elbow, initial encounter: Secondary | ICD-10-CM | POA: Insufficient documentation

## 2013-10-06 DIAGNOSIS — E785 Hyperlipidemia, unspecified: Secondary | ICD-10-CM | POA: Insufficient documentation

## 2013-10-06 DIAGNOSIS — S199XXA Unspecified injury of neck, initial encounter: Secondary | ICD-10-CM | POA: Insufficient documentation

## 2013-10-06 DIAGNOSIS — Z79899 Other long term (current) drug therapy: Secondary | ICD-10-CM | POA: Insufficient documentation

## 2013-10-06 DIAGNOSIS — Z860100 Personal history of colon polyps, unspecified: Secondary | ICD-10-CM | POA: Insufficient documentation

## 2013-10-06 DIAGNOSIS — S0993XA Unspecified injury of face, initial encounter: Secondary | ICD-10-CM | POA: Insufficient documentation

## 2013-10-06 DIAGNOSIS — E119 Type 2 diabetes mellitus without complications: Secondary | ICD-10-CM | POA: Insufficient documentation

## 2013-10-06 DIAGNOSIS — Y9389 Activity, other specified: Secondary | ICD-10-CM | POA: Insufficient documentation

## 2013-10-06 DIAGNOSIS — Z791 Long term (current) use of non-steroidal anti-inflammatories (NSAID): Secondary | ICD-10-CM | POA: Insufficient documentation

## 2013-10-06 DIAGNOSIS — J45909 Unspecified asthma, uncomplicated: Secondary | ICD-10-CM | POA: Insufficient documentation

## 2013-10-06 DIAGNOSIS — Y92009 Unspecified place in unspecified non-institutional (private) residence as the place of occurrence of the external cause: Secondary | ICD-10-CM | POA: Insufficient documentation

## 2013-10-06 DIAGNOSIS — E669 Obesity, unspecified: Secondary | ICD-10-CM | POA: Insufficient documentation

## 2013-10-06 DIAGNOSIS — I10 Essential (primary) hypertension: Secondary | ICD-10-CM | POA: Insufficient documentation

## 2013-10-06 DIAGNOSIS — Z8673 Personal history of transient ischemic attack (TIA), and cerebral infarction without residual deficits: Secondary | ICD-10-CM | POA: Insufficient documentation

## 2013-10-06 DIAGNOSIS — S6990XA Unspecified injury of unspecified wrist, hand and finger(s), initial encounter: Secondary | ICD-10-CM | POA: Insufficient documentation

## 2013-10-06 DIAGNOSIS — R296 Repeated falls: Secondary | ICD-10-CM | POA: Insufficient documentation

## 2013-10-06 DIAGNOSIS — Z7902 Long term (current) use of antithrombotics/antiplatelets: Secondary | ICD-10-CM | POA: Insufficient documentation

## 2013-10-06 DIAGNOSIS — K219 Gastro-esophageal reflux disease without esophagitis: Secondary | ICD-10-CM | POA: Insufficient documentation

## 2013-10-06 DIAGNOSIS — M1990 Unspecified osteoarthritis, unspecified site: Secondary | ICD-10-CM | POA: Insufficient documentation

## 2013-10-06 DIAGNOSIS — S060X0A Concussion without loss of consciousness, initial encounter: Secondary | ICD-10-CM | POA: Insufficient documentation

## 2013-10-06 DIAGNOSIS — Z87442 Personal history of urinary calculi: Secondary | ICD-10-CM | POA: Insufficient documentation

## 2013-10-06 DIAGNOSIS — Z8619 Personal history of other infectious and parasitic diseases: Secondary | ICD-10-CM | POA: Insufficient documentation

## 2013-10-06 DIAGNOSIS — Z87891 Personal history of nicotine dependence: Secondary | ICD-10-CM | POA: Insufficient documentation

## 2013-10-06 DIAGNOSIS — F319 Bipolar disorder, unspecified: Secondary | ICD-10-CM | POA: Insufficient documentation

## 2013-10-06 DIAGNOSIS — IMO0002 Reserved for concepts with insufficient information to code with codable children: Secondary | ICD-10-CM | POA: Insufficient documentation

## 2013-10-06 LAB — POCT I-STAT, CHEM 8
BUN: 23 mg/dL (ref 6–23)
Calcium, Ion: 1.19 mmol/L (ref 1.13–1.30)
Chloride: 105 mEq/L (ref 96–112)
Creatinine, Ser: 1.4 mg/dL — ABNORMAL HIGH (ref 0.50–1.35)
Glucose, Bld: 126 mg/dL — ABNORMAL HIGH (ref 70–99)
HCT: 39 % (ref 39.0–52.0)
Hemoglobin: 13.3 g/dL (ref 13.0–17.0)
Potassium: 3.7 mEq/L (ref 3.5–5.1)
Sodium: 140 mEq/L (ref 135–145)
TCO2: 25 mmol/L (ref 0–100)

## 2013-10-06 MED ORDER — MORPHINE SULFATE 4 MG/ML IJ SOLN
4.0000 mg | Freq: Once | INTRAMUSCULAR | Status: AC
  Administered 2013-10-06: 4 mg via INTRAVENOUS
  Filled 2013-10-06: qty 1

## 2013-10-06 NOTE — ED Provider Notes (Signed)
CSN: [id number]     Arrival date & time 10/06/13  0123 History   First MD Initiated Contact with Patient 10/06/13 0152     Chief Complaint  Patient presents with  . Fall  . Headache   (Consider location/radiation/quality/duration/timing/severity/associated sxs/prior Treatment) HPI Patient fell tonight on his driveway. He does not think he suffered syncopal event. His wife did not witness the event however ran out immediately after he fell to found to find him conscious and spinal ground. He complains of diffuse headache neck pain and right elbow pain since the event. He also suffered an abrasion to his back as a result fall. He ambulated from the house to the car and the car into the hospital. Pain is severe. Not made worse by anything. No other associated symptoms. No treatment prior to coming here. Pain is moderate to severe. He was not using his cane or walker. Patient does not have full recall of the event tonight. Past Medical History  Diagnosis Date  . Diabetes mellitus   . Hyperlipidemia   . Hypertension   . Bipolar affective   . Allergy     allergy shots Dr. Corinda Gubler  . DJD (degenerative joint disease)   . Obesity   . Asthma   . GERD (gastroesophageal reflux disease)   . Meniere's disease   . Hollenhorst plaque     right eye  . Personal history of colonic polyps 11/2004    hyperplastic.  [name] Dysphagia   . Kidney stones   . Pancreatitis   . Hepatitis A     as teenager 60's  . Gallstones   . Motility disorder, esophageal   . Stroke     per MRI   Past Surgical History  Procedure Laterality Date  . Cholecystectomy    . Total knee arthroplasty      both knees   Family History  Problem Relation Age of Onset  . Diabetes Father   . Heart disease Maternal Grandmother   . Colon cancer Neg Hx   . Cancer Sister     unknown type   History  Substance Use Topics  . Smoking status: Former Smoker    Quit date: 11/15/1964  . Smokeless tobacco: Never Used  . Alcohol Use: No     Review of Systems  Musculoskeletal: Positive for arthralgias and neck pain.  Skin: Positive for wound.       Abrasion to back  Neurological: Positive for headaches.       Unsteady gait at baseline. Walks with cane or walker  All other systems reviewed and are negative.    Allergies  Citalopram hydrobromide; Divalproex sodium; Methocarbamol; Other; Oxycodone-acetaminophen; Paroxetine; Percocet; Risperidone; Smz-tmp ds; and Wellbutrin  Home Medications   Current Outpatient Rx  Name  Route  Sig  Dispense  Refill  . asenapine (SAPHRIS) 5 MG SUBL   Sublingual   Place 5 mg under the tongue at bedtime.          . benztropine (COGENTIN) 2 MG tablet   Oral   Take 2 mg by mouth every evening.          . budesonide-formoterol (SYMBICORT) 160-4.5 MCG/ACT inhaler   Inhalation   Inhale 2 puffs into the lungs daily.          . clonazePAM (KLONOPIN) 0.5 MG tablet   Oral   Take 0.5 mg by mouth 4 (four) times daily.          . clopidogrel (PLAVIX) 75 MG tablet  Oral   Take 75 mg by mouth every evening.         . DULoxetine (CYMBALTA) 60 MG capsule   Oral   Take 60 mg by mouth every morning.          . enalapril (VASOTEC) 2.5 MG tablet   Oral   Take 2.5 mg by mouth every evening.         [name] esomeprazole (NEXIUM) 40 MG capsule   Oral   Take 40 mg by mouth every evening.         . fexofenadine (ALLEGRA) 180 MG tablet   Oral   Take 180 mg by mouth every morning.          . fluticasone (VERAMYST) 27.5 MCG/SPRAY nasal spray   Nasal   Place 1 spray into the nose daily as needed for rhinitis.         [name] gabapentin (NEURONTIN) 300 MG capsule   Oral   Take 300 mg by mouth 4 (four) times daily.          . LamoTRIgine (LAMICTAL XR) 50 MG TB24   Oral   Take 50 mg by mouth every evening.          . meclizine (ANTIVERT) 25 MG tablet   Oral   Take 25 mg by mouth 3 (three) times daily as needed for dizziness.         . Multiple Vitamins-Minerals ([employer]  SPECTRAVITE SENIOR) TABS   Oral   Take 1 tablet by mouth every morning.          . naproxen (NAPROSYN) 500 MG tablet   Oral   Take 1 tablet (500 mg total) by mouth 2 (two) times daily with a meal.   60 tablet   1   . pioglitazone-metformin (ACTOPLUS MET) 15-850 MG per tablet      TAKE 1 TABLET BY MOUTH 2 TIMES DAILY WITH MEALS   60 tablet   5   . simvastatin (ZOCOR) 40 MG tablet      TAKE 1 TABLET BY MOUTH ATBEDTIME.   30 tablet   5   . traZODone (DESYREL) 50 MG tablet   Oral   Take 50 mg by mouth at bedtime as needed for sleep.          BP 129/62  Pulse 80  Resp 22  SpO2 99% Physical Exam  Nursing note and vitals reviewed. Constitutional: He is oriented to person, place, and time. He appears well-developed and well-nourished. He appears distressed.  Appears mildly uncomfortable Glasgow Coma Score 15  HENT:  Right Ear: External ear normal.  Left Ear: External ear normal.  Approximately 2 cm hematoma with tenderness at occiput 1 mm abrasion a for head otherwise normocephalic atraumatic bilateral tympanic membranes normal  Eyes: Conjunctivae are normal. Pupils are equal, round, and reactive to light.  Neck: Neck supple. No tracheal deviation present. No thyromegaly present.  Cardiovascular: Normal rate and regular rhythm.   No murmur heard. Pulmonary/Chest: Effort normal and breath sounds normal.  Abdominal: Soft. Bowel sounds are normal. He exhibits no distension. There is no tenderness.  Musculoskeletal: Normal range of motion. He exhibits no edema and no tenderness.  Entire spine nontender pelvis stable nontender. Right upper extremity minimally tender at lateral aspect of the elbow. Full range of motion. No soft tissue swelling. Neurovascular intact. All other extremities no contusion abrasion or tenderness neurovascularly intact.  Neurological: He is alert and oriented to person, place, and time. He has  normal reflexes. No cranial nerve deficit. Coordination  normal.  Skin: Skin is warm and dry. No rash noted.  Linear 10 cm abrasion along right iliac crest. No corresponding tenderness  Psychiatric: He has a normal mood and affect.    ED Course  Procedures (including critical care time) Labs Review Labs Reviewed - No data to display Imaging Review No results found.  EKG Interpretation   None      Date: 10/06/2013  Rate: 85  Rhythm: normal sinus rhythm  QRS Axis: normal  Intervals: normal  ST/T Wave abnormalities: nonspecific T wave changes  Conduction Disutrbances:rightward ivcd  Narrative Interpretation:   Old EKG Reviewed: Unchanged from 09/18/2013 interpreted by me 5:15 AM patient's pain is improved he is alert Glasgow Coma Score 15. Walks with minimal assistance. Pain is improved. He feels ready to go home X-rays viewed by me Results for orders placed during the hospital encounter of 10/06/13  POCT I-STAT, CHEM 8      Result Value Range   Sodium 140  135 - 145 mEq/L   Potassium 3.7  3.5 - 5.1 mEq/L   Chloride 105  96 - 112 mEq/L   BUN 23  6 - 23 mg/dL   Creatinine, Ser 1.61 (*) 0.50 - 1.35 mg/dL   Glucose, Bld 096 (*) 70 - 99 mg/dL   Calcium, Ion 0.45  4.09 - 1.30 mmol/L   TCO2 25  0 - 100 mmol/L   Hemoglobin 13.3  13.0 - 17.0 g/dL   HCT 81.1  91.4 - 78.2 %   Dg Elbow Complete Right  10/06/2013   CLINICAL DATA:  Fall with elbow pain  EXAM: RIGHT ELBOW - COMPLETE 3+ VIEW  COMPARISON:  None.  FINDINGS: There is no evidence of fracture, dislocation, or joint effusion. There is elbow joint osteoarthritis with joint narrowing and marginal osteophytes. Mild subchondral cystic formation in the capitellum.  IMPRESSION: 1. Negative for acute osseous injury. 2. Elbow osteoarthritis.   Electronically Signed   By: Tiburcio Pea M.D.   On: 10/06/2013 03:08   Ct Head Wo Contrast  10/06/2013   CLINICAL DATA:  Fall with head injury  EXAM: CT HEAD WITHOUT CONTRAST  CT CERVICAL SPINE WITHOUT  CONTRAST  TECHNIQUE: Multidetector CT imaging of the head and cervical spine was performed following the standard protocol without intravenous contrast. Multiplanar CT image reconstructions of the cervical spine were also generated.  COMPARISON:  09/18/2013  FINDINGS: CT HEAD FINDINGS  Skull and Sinuses:Sizeable left parietal scalp hematoma. No underlying fracture.  Orbits: No acute abnormality.  Brain: No evidence of acute abnormality, such as acute infarction, hemorrhage, hydrocephalus, or mass lesion/mass effect.  CT CERVICAL SPINE FINDINGS  Negative for acute fracture or subluxation. No prevertebral edema. No gross cervical canal hematoma. Diffuse degenerative disc narrowing and facet osteoarthritis with spurring. No significant osseous canal or foraminal stenosis.  IMPRESSION: No evidence of acute intracranial injury or cervical spine fracture.   Electronically Signed   By: Tiburcio Pea M.D.   On: 10/06/2013 03:07   Ct Head Wo Contrast  09/18/2013   CLINICAL DATA:  Status post fall with head injury. Possible mild altered mental status.  EXAM: CT HEAD WITHOUT CONTRAST  CT CERVICAL SPINE WITHOUT CONTRAST  TECHNIQUE: Multidetector CT imaging of the head and cervical spine was performed following the standard protocol without intravenous contrast. Multiplanar CT image reconstructions of the cervical spine were also generated.  COMPARISON:  Head CT and CT angiography of the head and neck 12/17/2011.  FINDINGS: CT HEAD FINDINGS  There is no evidence of acute intracranial hemorrhage, mass lesion, brain edema or extra-axial fluid collection. The ventricles and subarachnoid spaces are appropriately sized for age. There is no CT evidence of acute cortical infarction. Intracranial vascular calcifications are noted.  The visualized paranasal sinuses, mastoid air cells and middle ears are clear. The calvarium is intact.  CT CERVICAL SPINE FINDINGS  The cervical alignment is normal. There is no evidence of acute  fracture or traumatic subluxation. There is multilevel spondylosis with disc space loss and uncinate spurring. No acute soft tissue findings are identified. The lung apices are clear.  IMPRESSION: 1. No acute intracranial or cervical spine findings demonstrated. 2. Multilevel cervical spondylosis appears grossly unchanged.   Electronically Signed   By: Roxy Horseman M.D.   On: 09/18/2013 17:56   Ct Cervical Spine Wo Contrast  10/06/2013   CLINICAL DATA:  Fall with head injury  EXAM: CT HEAD WITHOUT CONTRAST  CT CERVICAL SPINE WITHOUT CONTRAST  TECHNIQUE: Multidetector CT imaging of the head and cervical spine was performed following the standard protocol without intravenous contrast. Multiplanar CT image reconstructions of the cervical spine were also generated.  COMPARISON:  09/18/2013  FINDINGS: CT HEAD FINDINGS  Skull and Sinuses:Sizeable left parietal scalp hematoma. No underlying fracture.  Orbits: No acute abnormality.  Brain: No evidence of acute abnormality, such as acute infarction, hemorrhage, hydrocephalus, or mass lesion/mass effect.  CT CERVICAL SPINE FINDINGS  Negative for acute fracture or subluxation. No prevertebral edema. No gross cervical canal hematoma. Diffuse degenerative disc narrowing and facet osteoarthritis with spurring. No significant osseous canal or foraminal stenosis.  IMPRESSION: No evidence of acute intracranial injury or cervical spine fracture.   Electronically Signed   By: Tiburcio Pea M.D.   On: 10/06/2013 03:07   Ct Cervical Spine Wo Contrast  09/18/2013   CLINICAL DATA:  Status post fall with head injury. Possible mild altered mental status.  EXAM: CT HEAD WITHOUT CONTRAST  CT CERVICAL SPINE WITHOUT CONTRAST  TECHNIQUE: Multidetector CT imaging of the head and cervical spine was performed following the standard protocol without intravenous contrast. Multiplanar CT image reconstructions of the cervical spine were also generated.  COMPARISON:  Head CT and CT angiography  of the head and neck 12/17/2011.  FINDINGS: CT HEAD FINDINGS  There is no evidence of acute intracranial hemorrhage, mass lesion, brain edema or extra-axial fluid collection. The ventricles and subarachnoid spaces are appropriately sized for age. There is no CT evidence of acute cortical infarction. Intracranial vascular calcifications are noted.  The visualized paranasal sinuses, mastoid air cells and middle ears are clear. The calvarium is intact.  CT CERVICAL SPINE FINDINGS  The cervical alignment is normal. There is no evidence of acute fracture or traumatic subluxation. There is multilevel spondylosis with disc space loss and uncinate spurring. No acute soft tissue findings are identified. The lung apices are clear.  IMPRESSION: 1. No acute intracranial or cervical spine findings demonstrated. 2. Multilevel cervical spondylosis appears grossly unchanged.   Electronically Signed   By: Roxy Horseman M.D.   On: 09/18/2013 17:56    MDM  No diagnosis found. Plan Tylenol for pain. [facility]. Diagnosis #1 fall #2 concussion without loss of consciousness #3 contusions multiple sites    Doug Sou, MD 10/06/13 1610

## 2013-10-06 NOTE — ED Notes (Signed)
Pt. Fell approx 2 hours ago in garage and hit head. Positive LOC. Pt. C/o HA, neck pain, and right elbow pain. Alert and oriented x4.

## 2013-10-06 NOTE — ED Notes (Addendum)
Patient here after falling in his garage.  Patient's wife states that he hit his head on the floor in the garage.  Unknown if patient tripped or if he passed out.  Patient does not remember falling or hitting his head.  Large goose egg noted to left side of head parietal region.  Patient is CAOx3 at this time in triage.

## 2013-10-08 ENCOUNTER — Other Ambulatory Visit: Payer: Self-pay | Admitting: Family Medicine

## 2013-10-08 NOTE — Telephone Encounter (Signed)
Med filled.  

## 2013-10-09 NOTE — Telephone Encounter (Signed)
Office notes from past 2 visits and CPE  printed and mailed to [facility] and [employer].

## 2013-10-10 ENCOUNTER — Inpatient Hospital Stay (HOSPITAL_COMMUNITY): Admission: RE | Admit: 2013-10-10 | Payer: Medicare Other | Source: Ambulatory Visit

## 2013-10-10 ENCOUNTER — Encounter: Payer: Self-pay | Admitting: *Deleted

## 2013-10-29 ENCOUNTER — Other Ambulatory Visit: Payer: Self-pay | Admitting: Internal Medicine

## 2013-10-29 ENCOUNTER — Telehealth: Payer: Self-pay | Admitting: *Deleted

## 2013-10-29 NOTE — Telephone Encounter (Signed)
Pt was given this for acute issue- please ask why he needs a refill

## 2013-10-29 NOTE — Telephone Encounter (Signed)
rx refill- prednisone 10mg  Last OV- 09/25/13 Last refilled- 10/24/12  Please advise.

## 2013-10-30 NOTE — Telephone Encounter (Signed)
pts wife states that he feels like he's coming down with a cold and had prednisone before that helped with it. Pt's wife also states that they never got a call back regarding his referral to neuro rehab.  Please advise. DJR

## 2013-10-30 NOTE — Telephone Encounter (Signed)
Called pt and lmovm to return call.

## 2013-10-30 NOTE — Telephone Encounter (Signed)
Neuro rehab referral was entered 11/17 Due to the diabetes we tend not to give prednisone unless he develops problems w/ the asthma

## 2013-11-01 NOTE — Telephone Encounter (Signed)
Called pt and left a detailed message notifying.  

## 2013-11-13 ENCOUNTER — Ambulatory Visit: Payer: Medicare Other | Attending: Neurology | Admitting: Physical Therapy

## 2013-11-13 DIAGNOSIS — IMO0001 Reserved for inherently not codable concepts without codable children: Secondary | ICD-10-CM | POA: Insufficient documentation

## 2013-11-13 DIAGNOSIS — M6281 Muscle weakness (generalized): Secondary | ICD-10-CM | POA: Insufficient documentation

## 2013-11-13 DIAGNOSIS — R293 Abnormal posture: Secondary | ICD-10-CM | POA: Insufficient documentation

## 2013-11-13 DIAGNOSIS — R269 Unspecified abnormalities of gait and mobility: Secondary | ICD-10-CM | POA: Insufficient documentation

## 2013-11-16 ENCOUNTER — Ambulatory Visit: Payer: Medicare Other | Attending: Neurology | Admitting: Physical Therapy

## 2013-11-16 DIAGNOSIS — IMO0001 Reserved for inherently not codable concepts without codable children: Secondary | ICD-10-CM | POA: Insufficient documentation

## 2013-11-16 DIAGNOSIS — R293 Abnormal posture: Secondary | ICD-10-CM | POA: Insufficient documentation

## 2013-11-16 DIAGNOSIS — M6281 Muscle weakness (generalized): Secondary | ICD-10-CM | POA: Insufficient documentation

## 2013-11-16 DIAGNOSIS — R269 Unspecified abnormalities of gait and mobility: Secondary | ICD-10-CM | POA: Insufficient documentation

## 2013-11-20 ENCOUNTER — Ambulatory Visit: Payer: Medicare Other | Admitting: Physical Therapy

## 2013-11-23 ENCOUNTER — Encounter: Payer: Self-pay | Admitting: Family Medicine

## 2013-11-23 ENCOUNTER — Ambulatory Visit: Payer: Medicare Other | Admitting: Physical Therapy

## 2013-11-26 ENCOUNTER — Ambulatory Visit: Payer: Medicare Other | Admitting: Physical Therapy

## 2013-11-27 ENCOUNTER — Ambulatory Visit: Payer: Medicare Other | Admitting: Rehabilitative and Restorative Service Providers"

## 2013-11-29 ENCOUNTER — Ambulatory Visit: Payer: Medicare Other | Admitting: Physical Therapy

## 2013-11-30 ENCOUNTER — Ambulatory Visit (INDEPENDENT_AMBULATORY_CARE_PROVIDER_SITE_OTHER): Payer: Medicare Other | Admitting: Family Medicine

## 2013-11-30 ENCOUNTER — Encounter: Payer: Self-pay | Admitting: Family Medicine

## 2013-11-30 VITALS — BP 136/86 | HR 120 | Temp 98.6°F | Resp 17 | Wt 254.5 lb

## 2013-11-30 DIAGNOSIS — J321 Chronic frontal sinusitis: Secondary | ICD-10-CM | POA: Insufficient documentation

## 2013-11-30 MED ORDER — AMOXICILLIN 875 MG PO TABS: 875.0000 mg | ORAL_TABLET | Freq: Two times a day (BID) | ORAL | Status: DC

## 2013-11-30 NOTE — Progress Notes (Signed)
   Subjective:    Patient ID: Ryan Zhang, male    DOB: [birth year], 68 y.o.   MRN: [id number]  HPI URI- sxs started 1 week ago.  + nasal congestion, sinus pain, HA.  No fevers.  No wheezing.  No ear pain.  + sore throat.     Review of Systems For ROS see HPI     Objective:   Physical Exam  Constitutional: He appears well-developed and well-nourished. No distress.  HENT:  Head: Normocephalic and atraumatic.  Right Ear: Tympanic membrane normal.  Left Ear: Tympanic membrane normal.  Nose: Mucosal edema and rhinorrhea present. Right sinus exhibits frontal sinus tenderness. Right sinus exhibits no maxillary sinus tenderness. Left sinus exhibits frontal sinus tenderness. Left sinus exhibits no maxillary sinus tenderness.  Mouth/Throat: Mucous membranes are normal. Oropharyngeal exudate and posterior oropharyngeal erythema present. No posterior oropharyngeal edema.  + PND  Eyes: Conjunctivae and EOM are normal. Pupils are equal, round, and reactive to light.  Neck: Normal range of motion. Neck supple.  Cardiovascular: Normal rate, regular rhythm and normal heart sounds.   Pulmonary/Chest: Effort normal and breath sounds normal. No respiratory distress. He has no wheezes.  Lymphadenopathy:    He has no cervical adenopathy.  Skin: Skin is warm and dry.          Assessment & Plan:

## 2013-11-30 NOTE — Assessment & Plan Note (Signed)
New.  Pt's sxs and PE consistent w/ infxn.  Start abx.  Reviewed supportive care and red flags that should prompt return.  Pt expressed understanding and is in agreement w/ plan.  

## 2013-11-30 NOTE — Patient Instructions (Signed)
Follow up as needed Start the Amoxicillin twice daily- take w/ food Drink plenty of fluids REST! Call with any questions or concerns Hang in there!!! 

## 2013-11-30 NOTE — Progress Notes (Signed)
Pre visit review using our clinic review tool, if applicable. No additional management support is needed unless otherwise documented below in the visit note. 

## 2013-12-03 ENCOUNTER — Ambulatory Visit: Payer: Medicare Other | Admitting: Physical Therapy

## 2013-12-04 ENCOUNTER — Encounter: Payer: Self-pay | Admitting: Neurology

## 2013-12-04 ENCOUNTER — Ambulatory Visit (INDEPENDENT_AMBULATORY_CARE_PROVIDER_SITE_OTHER): Payer: Medicare Other | Admitting: Neurology

## 2013-12-04 VITALS — BP 134/76 | HR 68 | Temp 97.0°F | Resp 18 | Ht 69.0 in | Wt 254.2 lb

## 2013-12-04 DIAGNOSIS — G219 Secondary parkinsonism, unspecified: Secondary | ICD-10-CM

## 2013-12-04 NOTE — Progress Notes (Signed)
Ryan Zhang was seen today in neurologic consultation at the request of Annye Asa, MD.  The patient is accompanied by his wife who supplements the history.  The consultation is for the evaluation of "blackout spells."  He is accompanied by his wife who supplements the history.  I reviewed the previous notes.  I also reviewed Dr. Jodi Mourning previous notes.  Dr. Jacelyn Grip saw the patient over a year ago for transient bilateral vision loss.  There was 60% stenosis of the left vertebral, but he did not necessarily think that that contributed to his event.  His aspirin was changed to Plavix.  He had negative acetylcholine receptor antibodies.  His MRI suggested a possible hot cross bun sign.  Dr. Jacelyn Grip thought could be followed clinically for the development of any neurodegenerative process.  The patient subsequently went to the emergency room on 09/18/2013 for a fall.  ED notes indicate that the pt fell after tripping up stairs, without LOC.  The pt states that he doesn't remember what happened.  He remembers talking to his wife and went in the house and his wife heard a thud.  She found him at the bottom of the steps with legs up on the stairs.  His glasses were off.  She asked the pt if he was okay and he moaned.  She asked him what his first name was and he knew, but he did not know his last name or where he was.  His speech was not intelligible.  It took 10 min for his speech to become more normal.  His wife reports that he went with EMS and by the time that she saw him 4 hours later at the hospital, he was normal.  He does remember some of the ambulance ride.  He denies anything similar to this.  The episode for which he saw Dr. Jacelyn Grip was very different than this.  He does admit to significant fearfulness with falling backward and he is very careful to hold handrails so feels that he was holding the handrail.  He has had vertigo for a long time.  He states that he spins but when he stands up he is off balance.   He has no tremor.  He states that he went to balance therapy about a year ago.  He denies voice change.  He admits to some memory change.  He had ECT in 2010 and had significant memory change after that, but it did get somewhat better but never back to baseline.  He admits to swallowing changes with solids.  He denies double vision.  He denies ptosis.  He has trouble keeping the eyelids open ("my wife gets mad a me for closing the eyes all the time").  It has been going on for years.   He is on Saphris and has been on that for 2-3 years.  He was previously on Geodon, Risperdal and several others (cannot remember the name of them all).    12/04/13 update:  The pt had an ambulatory EEG since last visit and that was normal.  Likewise, he underwent a DaT scan that was negative.  He had another fall in late November and he had to go to the ED.  I read those notes.  He doesn't think that there was LOC with that event like the previous ones.  ER notes indicate no definitive LOC.  He had another event last week that he describes as vertigo.  He remains on the saphris and is  fearful of changing this since he had tried others in the past without success.   Neuroimaging has previously been performed.  It is available for my review today.  I agree with Dr. Jacelyn Grip that there appears to be a hot cross bun sign.  MRI brain 12/09/2011:  Clinical Data: 68 year old male with blackouts episodes. 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  Comparison: Head CT without contrast 03/07/2010.  MRI HEAD WITHOUT CONTRAST  Technique: Multiplanar, multiecho pulse sequences of the brain and  surrounding structures were obtained according to standard protocol  without intravenous contrast.  Findings: No restricted diffusion to suggest acute infarction. No  midline shift, mass effect, evidence of mass lesion,  ventriculomegaly, extra-axial collection or acute intracranial  hemorrhage. Cervicomedullary junction and pituitary  are within  normal limits. Major intracranial vascular flow voids are  preserved; dominant distal left vertebral artery.  Cruciform T2 and FLAIR hyperintensity in the pons. Probable small  chronic lacunar infarct in the left dorsal pons and series 5 image  6. Mild for age nonspecific cerebral white matter T2 and FLAIR  hyperintensity. Possible chronic blood products in the left  caudate nucleus, otherwise negative deep gray matter nuclei. No  cortical encephalomalacia. Negative visualized cervical spine.  Visualized bone marrow signal is within normal limits.  Visualized orbit soft tissues are within normal limits. Visualized  paranasal sinuses and mastoids are clear. Negative visualized  scalp soft tissues.  IMPRESSION:  1. No acute intracranial abnormality.  2. Signal changes in the brain stem are nonspecific, but favor  small vessel disease in this setting.   PREVIOUS MEDICATIONS: Not applicable  ALLERGIES:   Allergies  Allergen Reactions  . Citalopram Hydrobromide     insomnia  . Divalproex Sodium     Pancreatitis   . Methocarbamol     hallucinations  . Other     "Symbrax" blood sugar  . Oxycodone-Acetaminophen   . Paroxetine     insomnia  . Percocet [Oxycodone-Acetaminophen]     Reversed response  . Risperidone     REACTION: hyper  . Smz-Tmp Ds [Sulfamethoxazole-Trimethoprim] Nausea And Vomiting  . Wellbutrin [Bupropion]     Hot flashes    CURRENT MEDICATIONS:  Current Outpatient Prescriptions on File Prior to Visit  Medication Sig Dispense Refill  . amoxicillin (AMOXIL) 875 MG tablet Take 1 tablet (875 mg total) by mouth 2 (two) times daily.  20 tablet  0  . asenapine (SAPHRIS) 5 MG SUBL Place 5 mg under the tongue at bedtime.       . benztropine (COGENTIN) 2 MG tablet Take 2 mg by mouth every evening.       . budesonide-formoterol (SYMBICORT) 160-4.5 MCG/ACT inhaler Inhale 2 puffs into the lungs daily.       . clonazePAM (KLONOPIN) 0.5 MG tablet Take 0.5 mg  by mouth 4 (four) times daily.       . clopidogrel (PLAVIX) 75 MG tablet Take 75 mg by mouth every evening.      . DULoxetine (CYMBALTA) 60 MG capsule Take 60 mg by mouth every morning.       . enalapril (VASOTEC) 2.5 MG tablet TAKE 1 TABLET BY MOUTH DAILY.  30 tablet  6  . esomeprazole (NEXIUM) 40 MG capsule Take 40 mg by mouth every evening.      . fexofenadine (ALLEGRA) 180 MG tablet Take 180 mg by mouth every morning.       . fluticasone (VERAMYST) 27.5 MCG/SPRAY nasal spray Place 1 spray into  the nose daily as needed for rhinitis.      [name] gabapentin (NEURONTIN) 300 MG capsule Take 300 mg by mouth 4 (four) times daily.       . LamoTRIgine (LAMICTAL XR) 50 MG TB24 Take 50 mg by mouth every evening.       . meclizine (ANTIVERT) 25 MG tablet Take 25 mg by mouth 3 (three) times daily as needed for dizziness.      . Multiple Vitamins-Minerals ([employer] SPECTRAVITE SENIOR) TABS Take 1 tablet by mouth every morning.       . naproxen (NAPROSYN) 500 MG tablet Take 1 tablet (500 mg total) by mouth 2 (two) times daily with a meal.  60 tablet  1  . pioglitazone-metformin (ACTOPLUS MET) 15-850 MG per tablet Take 1 tablet by mouth 2 (two) times daily with a meal.      . simvastatin (ZOCOR) 40 MG tablet Take 40 mg by mouth daily.      . traZODone (DESYREL) 50 MG tablet Take 50 mg by mouth at bedtime as needed for sleep.       No current facility-administered medications on file prior to visit.    PAST MEDICAL HISTORY:   Past Medical History  Diagnosis Date  . Diabetes mellitus   . Hyperlipidemia   . Hypertension   . Bipolar affective   . Allergy     allergy shots Dr. Velora Heckler  . DJD (degenerative joint disease)   . Obesity   . Asthma   . GERD (gastroesophageal reflux disease)   . Meniere's disease   . Hollenhorst plaque     right eye  . Personal history of colonic polyps 11/2004    hyperplastic.  [name] Dysphagia   . Kidney stones   . Pancreatitis   . Hepatitis A     as teenager 84's  . Gallstones    . Motility disorder, esophageal   . Stroke     per MRI    PAST SURGICAL HISTORY:   Past Surgical History  Procedure Laterality Date  . Cholecystectomy    . Total knee arthroplasty      both knees    SOCIAL HISTORY:   History   Social History  . Marital Status: Married    Spouse Name: N/A    Number of Children: 1  . Years of Education: N/A   Occupational History  . disabled     laboratory computing/programming   Social History Main Topics  . Smoking status: Former Smoker    Quit date: 11/15/1964  . Smokeless tobacco: Never Used  . Alcohol Use: No  . Drug Use: No  . Sexual Activity: Not on file   Other Topics Concern  . Not on file   Social History Narrative  . No narrative on file    FAMILY HISTORY:   Family Status  Relation Status Death Age  . Mother Deceased     CA, breast  . Father Deceased 5    "old age"  . Sister Deceased     CA  . Brother Alive     healthy  . Brother Alive     depression, obesity  . Child Alive     adopted, drug addiction    ROS:  A complete 10 system review of systems was obtained and was unremarkable apart from what is mentioned above.  PHYSICAL EXAMINATION:    VITALS:   There were no vitals filed for this visit.  GEN:  Normal appears male in no acute distress.  Appears stated age.   NEUROLOGICAL: Orientation:  The patient is alert and oriented x 3.  Fund of knowledge is appropriate.  Recent and remote memory intact.  Attention span and concentration normal.  Repeats and names without difficulty. Cranial nerves: There is good facial symmetry.  Eyelids remain opened today.  The pupils are equal round and reactive to light bilaterally. Fundoscopic exam reveals clear disc margins bilaterally. Extraocular muscles are intact and visual fields are full to confrontational testing. Speech is fluent and clear. Soft palate rises symmetrically and there is no tongue deviation. Hearing is intact to conversational tone. Tone: Tone  is good throughout.  There is no increase in tone. Sensation: Sensation is intact to light touch and pinprick throughout (facial, trunk, extremities). Vibration is decreased distally. There is no extinction with double simultaneous stimulation. There is no sensory dermatomal level identified. Coordination:  The patient has no difficulty with RAM's or FNF bilaterally. Motor: He is somewhat apraxic when asked to perform various motor/coordination commands.  Strength is 5/5 in the bilateral upper and lower extremities.  Shoulder shrug is equal and symmetric. There is no pronator drift.  There are no fasciculations noted. Gait and Station: The patient has some trouble getting out of a chair without use of his hands.  He is mildly ataxic and short steps.  He has a positive pull test.   IMPRESSION/PLAN  1. Syncope.  -It appears that he has had falls and syncope.  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  His DaT was negative.  It is very concerning that the pt doesn't feel that he could change medication as I see several complications, likely all from the medication.  This could continue to progress with longer term exposure to the D2 antagonist.  He will continue to be a fall risk.  I wonder if the syncope is O.H. related syncope from the saphris.  However, I do understand that he has tried multiple other medications in the past without success.  I will leave the medications to the discretion of his psychiatrist, Gala Murdoch.  I will send her a copy of this today as well.   2. He will f/u with me prn

## 2013-12-04 NOTE — Patient Instructions (Addendum)
1.  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  There is no evidence of parkinsons disease or any related state. Please call if you need us in the future, we will see you as needed.

## 2013-12-05 ENCOUNTER — Ambulatory Visit: Payer: Medicare Other

## 2013-12-05 ENCOUNTER — Telehealth: Payer: Self-pay | Admitting: Neurology

## 2013-12-05 NOTE — Telephone Encounter (Signed)
Patient signed release for us to send information to Valinda HoarMeredith Baker, NP. Office note from 12/04/2013 faxed with confirmation received.

## 2013-12-06 ENCOUNTER — Ambulatory Visit: Payer: Medicare Other | Admitting: Physical Therapy

## 2013-12-11 ENCOUNTER — Ambulatory Visit: Payer: Medicare Other | Admitting: Physical Therapy

## 2013-12-14 ENCOUNTER — Ambulatory Visit: Payer: Medicare Other | Admitting: Physical Therapy

## 2013-12-17 ENCOUNTER — Ambulatory Visit: Payer: Medicare Other | Attending: Neurology | Admitting: Physical Therapy

## 2013-12-17 ENCOUNTER — Ambulatory Visit (INDEPENDENT_AMBULATORY_CARE_PROVIDER_SITE_OTHER): Payer: Medicare Other | Admitting: Physician Assistant

## 2013-12-17 ENCOUNTER — Encounter: Payer: Self-pay | Admitting: Physician Assistant

## 2013-12-17 VITALS — BP 130/77 | HR 88 | Temp 97.8°F | Resp 18 | Ht 69.0 in | Wt 265.1 lb

## 2013-12-17 DIAGNOSIS — R269 Unspecified abnormalities of gait and mobility: Secondary | ICD-10-CM | POA: Insufficient documentation

## 2013-12-17 DIAGNOSIS — S71109A Unspecified open wound, unspecified thigh, initial encounter: Secondary | ICD-10-CM

## 2013-12-17 DIAGNOSIS — W19XXXA Unspecified fall, initial encounter: Secondary | ICD-10-CM

## 2013-12-17 DIAGNOSIS — M6281 Muscle weakness (generalized): Secondary | ICD-10-CM | POA: Insufficient documentation

## 2013-12-17 DIAGNOSIS — IMO0001 Reserved for inherently not codable concepts without codable children: Secondary | ICD-10-CM | POA: Insufficient documentation

## 2013-12-17 DIAGNOSIS — S71009A Unspecified open wound, unspecified hip, initial encounter: Secondary | ICD-10-CM

## 2013-12-17 DIAGNOSIS — R293 Abnormal posture: Secondary | ICD-10-CM | POA: Insufficient documentation

## 2013-12-17 DIAGNOSIS — S6990XA Unspecified injury of unspecified wrist, hand and finger(s), initial encounter: Secondary | ICD-10-CM

## 2013-12-17 NOTE — Patient Instructions (Signed)
Continue OTC naproxen as needed for pain.  Topical aspercreme.  Put a heating pad on your left leg.  Elevate leg at home.  For had -- keep area clean, dry and put a bandage with Neosporin over the region of concern. This will heal over time.

## 2013-12-17 NOTE — Progress Notes (Signed)
Pre visit review using our clinic review tool, if applicable. No additional management support is needed unless otherwise documented below in the visit note/SLS  

## 2013-12-18 ENCOUNTER — Ambulatory Visit: Payer: Medicare Other | Admitting: Physical Therapy

## 2013-12-21 ENCOUNTER — Ambulatory Visit: Payer: Medicare Other | Admitting: Physical Therapy

## 2013-12-22 ENCOUNTER — Other Ambulatory Visit: Payer: Self-pay | Admitting: Family Medicine

## 2013-12-22 NOTE — Progress Notes (Signed)
Patient presents to clinic today c/o right hand laceration and left thigh pain after sustaining a fall at home 3 days ago.  Patient slipped and fell down a couple of stairs. States he fell into stacked cardboard boxes that "broke his fall"  Patient denies trauma or injury to his head.  Denies LOC.  Patient denies lightheadedness, dizziness or syncope prior to fall.  Patient has significant history of multiple falls. Patient is followed by Neurology and is currently in neuro-rehabilitation.  Patient has been encouraged by Neurologist to stop his Saphris as it is most likely contributing significantly to recurrent falls.  Patient refuses to stop medication.  Medication is prescribed by patient's psychiatrist.  For hand "trauma", patient endorses torn skin that he has kept clean, dry and bandaged.  Denies numbness or tingling in wrist, hand or digits.  Denies laceration.  Denies decreased range of motion or fingers, wrist, elbow on right side.  For hip/thigh pain, patient described more of a soreness than bony tenderness or sharp pain.  Patient has been able to bear weight.  Denies difficulty with ambulation.  Past Medical History  Diagnosis Date  . Diabetes mellitus   . Hyperlipidemia   . Hypertension   . Bipolar affective   . Allergy     allergy shots Dr. Velora Heckler  . DJD (degenerative joint disease)   . Obesity   . Asthma   . GERD (gastroesophageal reflux disease)   . Meniere's disease   . Hollenhorst plaque     right eye  . Personal history of colonic polyps 11/2004    hyperplastic.  [name] Dysphagia   . Kidney stones   . Pancreatitis   . Hepatitis A     as teenager 16's  . Gallstones   . Motility disorder, esophageal   . Stroke     per MRI    Current Outpatient Prescriptions on File Prior to Visit  Medication Sig Dispense Refill  . asenapine (SAPHRIS) 5 MG SUBL Place 5 mg under the tongue at bedtime.       . benztropine (COGENTIN) 2 MG tablet Take 2 mg by mouth every evening.       .  budesonide-formoterol (SYMBICORT) 160-4.5 MCG/ACT inhaler Inhale 2 puffs into the lungs daily.       . clonazePAM (KLONOPIN) 0.5 MG tablet Take 0.5 mg by mouth 4 (four) times daily.       . clopidogrel (PLAVIX) 75 MG tablet Take 75 mg by mouth every evening.      . DULoxetine (CYMBALTA) 60 MG capsule Take 60 mg by mouth every morning.       . enalapril (VASOTEC) 2.5 MG tablet TAKE 1 TABLET BY MOUTH DAILY.  30 tablet  6  . esomeprazole (NEXIUM) 40 MG capsule Take 40 mg by mouth every evening.      . fexofenadine (ALLEGRA) 180 MG tablet Take 180 mg by mouth every morning.       . gabapentin (NEURONTIN) 300 MG capsule Take 300 mg by mouth 4 (four) times daily.       . LamoTRIgine (LAMICTAL XR) 50 MG TB24 Take 50 mg by mouth every evening.       . meclizine (ANTIVERT) 25 MG tablet Take 25 mg by mouth 3 (three) times daily as needed for dizziness.      . Multiple Vitamins-Minerals ([employer] SPECTRAVITE SENIOR) TABS Take 1 tablet by mouth every morning.       . naproxen (NAPROSYN) 500 MG tablet Take 1 tablet (500  mg total) by mouth 2 (two) times daily with a meal.  60 tablet  1  . pioglitazone-metformin (ACTOPLUS MET) 15-850 MG per tablet Take 1 tablet by mouth 2 (two) times daily with a meal.      . simvastatin (ZOCOR) 40 MG tablet Take 40 mg by mouth daily.      . traZODone (DESYREL) 50 MG tablet Take 50 mg by mouth at bedtime as needed for sleep.       No current facility-administered medications on file prior to visit.    Allergies  Allergen Reactions  . Citalopram Hydrobromide     insomnia  . Divalproex Sodium     Pancreatitis   . Methocarbamol     hallucinations  . Other     "Symbrax" blood sugar  . Oxycodone-Acetaminophen   . Paroxetine     insomnia  . Percocet [Oxycodone-Acetaminophen]     Reversed response  . Risperidone     REACTION: hyper  . Smz-Tmp Ds [Sulfamethoxazole-Trimethoprim] Nausea And Vomiting  . Wellbutrin [Bupropion]     Hot flashes    Family History  Problem  Relation Age of Onset  . Diabetes Father   . Heart disease Maternal Grandmother   . Colon cancer Neg Hx   . Cancer Sister     unknown type    History   Social History  . Marital Status: Married    Spouse Name: N/A    Number of Children: 1  . Years of Education: N/A   Occupational History  . disabled     laboratory computing/programming   Social History Main Topics  . Smoking status: Former Smoker    Quit date: 11/15/1964  . Smokeless tobacco: Never Used  . Alcohol Use: No  . Drug Use: No  . Sexual Activity: None   Other Topics Concern  . None   Social History Narrative  . None    Review of Systems - See HPI.  All other ROS are negative.  BP 130/77  Pulse 88  Temp(Src) 97.8 F (36.6 C) (Oral)  Resp 18  Ht 5' 9"  (1.753 m)  Wt 265 lb 2 oz (120.26 kg)  BMI 39.13 kg/m2  SpO2 96%  Physical Exam  Vitals reviewed. Constitutional: He is oriented to person, place, and time and well-developed, well-nourished, and in no distress.  HENT:  Head: Normocephalic and atraumatic.  Eyes: Conjunctivae and EOM are normal. Pupils are equal, round, and reactive to light.  Neck: Neck supple.  Cardiovascular: Normal rate, regular rhythm, normal heart sounds and intact distal pulses.   Pulmonary/Chest: Effort normal and breath sounds normal.  Musculoskeletal: Normal range of motion.  Neurological: He is alert and oriented to person, place, and time. No cranial nerve deficit. GCS score is 15.  Skin: Skin is warm and dry.  Presence of two superficial abrasions with skin avulsion located on the dorsum of the right hand, both measuring 2 cm in diameter.  No evidence of laceration, erythema, warmth or drainage.  Psychiatric: Affect normal.    Recent Results (from the past 2160 hour(s))  CBC WITH DIFFERENTIAL     Status: Abnormal   Collection Time    09/25/13  9:42 AM      Result Value Range   WBC 5.8  4.5 - 10.5 K/uL   RBC 4.63  4.22 - 5.81 Mil/uL   Hemoglobin 12.7 (*) 13.0 -  17.0 g/dL   HCT 38.2 (*) 39.0 - 52.0 %   MCV 82.4  78.0 - 100.0  fl   MCHC 33.2  30.0 - 36.0 g/dL   RDW 15.3 (*) 11.5 - 14.6 %   Platelets 232.0  150.0 - 400.0 K/uL   Neutrophils Relative % 60.2  43.0 - 77.0 %   Lymphocytes Relative 29.2  12.0 - 46.0 %   Monocytes Relative 6.7  3.0 - 12.0 %   Eosinophils Relative 3.4  0.0 - 5.0 %   Basophils Relative 0.5  0.0 - 3.0 %   Neutro Abs 3.5  1.4 - 7.7 K/uL   Lymphs Abs 1.7  0.7 - 4.0 K/uL   Monocytes Absolute 0.4  0.1 - 1.0 K/uL   Eosinophils Absolute 0.2  0.0 - 0.7 K/uL   Basophils Absolute 0.0  0.0 - 0.1 K/uL  BASIC METABOLIC PANEL     Status: Abnormal   Collection Time    09/25/13  9:42 AM      Result Value Range   Sodium 140  135 - 145 mEq/L   Potassium 3.7  3.5 - 5.1 mEq/L   Chloride 105  96 - 112 mEq/L   CO2 27  19 - 32 mEq/L   Glucose, Bld 165 (*) 70 - 99 mg/dL   BUN 21  6 - 23 mg/dL   Creatinine, Ser 1.0  0.4 - 1.5 mg/dL   Calcium 9.1  8.4 - 10.5 mg/dL   GFR 83.47  >60.00 mL/min  HEPATIC FUNCTION PANEL     Status: Abnormal   Collection Time    09/25/13  9:42 AM      Result Value Range   Total Bilirubin 1.3 (*) 0.3 - 1.2 mg/dL   Bilirubin, Direct 0.1  0.0 - 0.3 mg/dL   Alkaline Phosphatase 72  39 - 117 U/L   AST 18  0 - 37 U/L   ALT 15  0 - 53 U/L   Total Protein 6.7  6.0 - 8.3 g/dL   Albumin 3.7  3.5 - 5.2 g/dL  LIPID PANEL     Status: None   Collection Time    09/25/13  9:42 AM      Result Value Range   Cholesterol 144  0 - 200 mg/dL   Comment: ATP III Classification       Desirable:  < 200 mg/dL               Borderline High:  200 - 239 mg/dL          High:  > = 240 mg/dL   Triglycerides 105.0  0.0 - 149.0 mg/dL   Comment: Normal:  <150 mg/dLBorderline High:  150 - 199 mg/dL   HDL 39.30  >39.00 mg/dL   VLDL 21.0  0.0 - 40.0 mg/dL   LDL Cholesterol 84  0 - 99 mg/dL   Total CHOL/HDL Ratio 4     Comment:                Men          Women1/2 Average Risk     3.4          3.3Average Risk          5.0          4.42X  Average Risk          9.6          7.13X Average Risk          15.0          11.0  HEMOGLOBIN A1C     Status: None   Collection Time    09/25/13  9:42 AM      Result Value Range   Hemoglobin A1C 6.5  4.6 - 6.5 %   Comment: Glycemic Control Guidelines for People with Diabetes:Non Diabetic:  <6%Goal of Therapy: <7%Additional Action Suggested:  >8%   POCT I-STAT, CHEM 8     Status: Abnormal   Collection Time    10/06/13  2:15 AM      Result Value Range   Sodium 140  135 - 145 mEq/L   Potassium 3.7  3.5 - 5.1 mEq/L   Chloride 105  96 - 112 mEq/L   BUN 23  6 - 23 mg/dL   Creatinine, Ser 1.40 (*) 0.50 - 1.35 mg/dL   Glucose, Bld 126 (*) 70 - 99 mg/dL   Calcium, Ion 1.19  1.13 - 1.30 mmol/L   TCO2 25  0 - 100 mmol/L   Hemoglobin 13.3  13.0 - 17.0 g/dL   HCT 39.0  39.0 - 52.0 %    Assessment/Plan: Fall Patient with history of recurrent falls. No major trauma warranting further workup. Skin Avulsion of right hand was cleaned and dead skin removed.  Triple antibiotic ointment applied and area was dressed.  Reinforced recommendation from Neurologist that patient should speak with his Psychiatrist about a medication alternate to Saphris as is it is likely contributing to his symptoms.  Patient refuses to consider change in medication.  Instructed patient to continue with Neuro Rehabilitation and follow-up with Neurology.  Avoid wearing loose shoes or slip on shoes.  Discusses home safety measures to reduce fall risk.

## 2013-12-23 DIAGNOSIS — W19XXXA Unspecified fall, initial encounter: Secondary | ICD-10-CM | POA: Insufficient documentation

## 2013-12-23 NOTE — Assessment & Plan Note (Addendum)
Patient with history of recurrent falls. No major trauma warranting further workup. Skin Avulsion of right hand was cleaned and dead skin removed.  Triple antibiotic ointment applied and area was dressed.  Reinforced recommendation from Neurologist that patient should speak with his Psychiatrist about a medication alternate to Saphris as is it is likely contributing to his symptoms.  Patient refuses to consider change in medication.  Instructed patient to continue with Neuro Rehabilitation and follow-up with Neurology.  Avoid wearing loose shoes or slip on shoes.  Discusses home safety measures to reduce fall risk.

## 2013-12-24 ENCOUNTER — Ambulatory Visit: Payer: Medicare Other | Admitting: Physical Therapy

## 2013-12-24 NOTE — Telephone Encounter (Signed)
Med filled.  

## 2013-12-25 ENCOUNTER — Emergency Department (HOSPITAL_BASED_OUTPATIENT_CLINIC_OR_DEPARTMENT_OTHER)
Admission: EM | Admit: 2013-12-25 | Discharge: 2013-12-25 | Disposition: A | Discharge status: Home / Self Care | Payer: Medicare Other | Attending: Emergency Medicine | Admitting: Emergency Medicine

## 2013-12-25 ENCOUNTER — Encounter (HOSPITAL_BASED_OUTPATIENT_CLINIC_OR_DEPARTMENT_OTHER): Payer: Self-pay | Admitting: Emergency Medicine

## 2013-12-25 ENCOUNTER — Emergency Department (HOSPITAL_BASED_OUTPATIENT_CLINIC_OR_DEPARTMENT_OTHER): Payer: Medicare Other

## 2013-12-25 DIAGNOSIS — M1990 Unspecified osteoarthritis, unspecified site: Secondary | ICD-10-CM | POA: Insufficient documentation

## 2013-12-25 DIAGNOSIS — E785 Hyperlipidemia, unspecified: Secondary | ICD-10-CM | POA: Insufficient documentation

## 2013-12-25 DIAGNOSIS — S99919A Unspecified injury of unspecified ankle, initial encounter: Principal | ICD-10-CM

## 2013-12-25 DIAGNOSIS — W108XXA Fall (on) (from) other stairs and steps, initial encounter: Secondary | ICD-10-CM | POA: Insufficient documentation

## 2013-12-25 DIAGNOSIS — Z96659 Presence of unspecified artificial knee joint: Secondary | ICD-10-CM | POA: Insufficient documentation

## 2013-12-25 DIAGNOSIS — J45909 Unspecified asthma, uncomplicated: Secondary | ICD-10-CM | POA: Insufficient documentation

## 2013-12-25 DIAGNOSIS — E119 Type 2 diabetes mellitus without complications: Secondary | ICD-10-CM | POA: Insufficient documentation

## 2013-12-25 DIAGNOSIS — Z87442 Personal history of urinary calculi: Secondary | ICD-10-CM | POA: Insufficient documentation

## 2013-12-25 DIAGNOSIS — Z87891 Personal history of nicotine dependence: Secondary | ICD-10-CM | POA: Insufficient documentation

## 2013-12-25 DIAGNOSIS — W19XXXA Unspecified fall, initial encounter: Secondary | ICD-10-CM

## 2013-12-25 DIAGNOSIS — Z8673 Personal history of transient ischemic attack (TIA), and cerebral infarction without residual deficits: Secondary | ICD-10-CM | POA: Insufficient documentation

## 2013-12-25 DIAGNOSIS — S99929A Unspecified injury of unspecified foot, initial encounter: Principal | ICD-10-CM

## 2013-12-25 DIAGNOSIS — Z860100 Personal history of colon polyps, unspecified: Secondary | ICD-10-CM | POA: Insufficient documentation

## 2013-12-25 DIAGNOSIS — S8990XA Unspecified injury of unspecified lower leg, initial encounter: Secondary | ICD-10-CM | POA: Insufficient documentation

## 2013-12-25 DIAGNOSIS — K219 Gastro-esophageal reflux disease without esophagitis: Secondary | ICD-10-CM | POA: Insufficient documentation

## 2013-12-25 DIAGNOSIS — F319 Bipolar disorder, unspecified: Secondary | ICD-10-CM | POA: Insufficient documentation

## 2013-12-25 DIAGNOSIS — Z791 Long term (current) use of non-steroidal anti-inflammatories (NSAID): Secondary | ICD-10-CM | POA: Insufficient documentation

## 2013-12-25 DIAGNOSIS — E669 Obesity, unspecified: Secondary | ICD-10-CM | POA: Insufficient documentation

## 2013-12-25 DIAGNOSIS — S8992XA Unspecified injury of left lower leg, initial encounter: Secondary | ICD-10-CM

## 2013-12-25 DIAGNOSIS — Z79899 Other long term (current) drug therapy: Secondary | ICD-10-CM | POA: Insufficient documentation

## 2013-12-25 DIAGNOSIS — Y939 Activity, unspecified: Secondary | ICD-10-CM | POA: Insufficient documentation

## 2013-12-25 DIAGNOSIS — Z8619 Personal history of other infectious and parasitic diseases: Secondary | ICD-10-CM | POA: Insufficient documentation

## 2013-12-25 DIAGNOSIS — I10 Essential (primary) hypertension: Secondary | ICD-10-CM | POA: Insufficient documentation

## 2013-12-25 DIAGNOSIS — Z8669 Personal history of other diseases of the nervous system and sense organs: Secondary | ICD-10-CM | POA: Insufficient documentation

## 2013-12-25 DIAGNOSIS — Y929 Unspecified place or not applicable: Secondary | ICD-10-CM | POA: Insufficient documentation

## 2013-12-25 DIAGNOSIS — Z7902 Long term (current) use of antithrombotics/antiplatelets: Secondary | ICD-10-CM | POA: Insufficient documentation

## 2013-12-25 DIAGNOSIS — IMO0002 Reserved for concepts with insufficient information to code with codable children: Secondary | ICD-10-CM | POA: Insufficient documentation

## 2013-12-25 MED ORDER — CEPHALEXIN 500 MG PO CAPS: 500.0000 mg | ORAL_CAPSULE | Freq: Four times a day (QID) | ORAL | Status: DC

## 2013-12-25 MED ORDER — NAPROXEN 250 MG PO TABS
500.0000 mg | ORAL_TABLET | Freq: Once | ORAL | Status: AC
  Administered 2013-12-25: 500 mg via ORAL
  Filled 2013-12-25: qty 2

## 2013-12-25 NOTE — ED Provider Notes (Signed)
Medical screening examination/treatment/procedure(s) were performed by non-physician practitioner and as supervising physician I was immediately available for consultation/collaboration.  EKG Interpretation   None         Rolan BuccoMelanie Jaklyn Alen, MD 12/25/13 1515

## 2013-12-25 NOTE — ED Provider Notes (Signed)
CSN: [id number]     Arrival date & time 12/25/13  1229 History   First MD Initiated Contact with Patient 12/25/13 1235     Chief Complaint  Patient presents with  . Fall     (Consider location/radiation/quality/duration/timing/severity/associated sxs/prior Treatment) HPI Comments: Patient is a 68 year old male who reports left knee pain that started gradually after a mechanical fall that occurred 3 days ago. The pain is aching and severe without radiation. Palpation of the knee and movement makes the pain worse. No alleviating factors. Patient reports associated abrasion to the left knee. Patient denies any other injury.   Patient is a 68 y.o. male presenting with fall. The history is provided by the patient. No language interpreter was used.  Fall This is a new problem. The current episode started in the past 7 days. The problem occurs intermittently. The problem has been gradually worsening. Associated symptoms include arthralgias and joint swelling. Pertinent negatives include no abdominal pain, chest pain, chills, fatigue, fever, nausea, neck pain, vomiting or weakness. The symptoms are aggravated by walking. He has tried rest for the symptoms. The treatment provided no relief.    Past Medical History  Diagnosis Date  . Diabetes mellitus   . Hyperlipidemia   . Hypertension   . Bipolar affective   . Allergy     allergy shots Dr. [place]  . DJD (degenerative joint disease)   . Obesity   . Asthma   . GERD (gastroesophageal reflux disease)   . Meniere's disease   . Hollenhorst plaque     right eye  . Personal history of colonic polyps 11/2004    hyperplastic.  . Dysphagia   . Kidney stones   . Pancreatitis   . Hepatitis A     as teenager 60's  . Gallstones   . Motility disorder, esophageal   . Stroke     per MRI   Past Surgical History  Procedure Laterality Date  . Cholecystectomy    . Total knee arthroplasty      both knees   Family History  Problem Relation Age of  Onset  . Diabetes Father   . Heart disease Maternal Grandmother   . Colon cancer Neg Hx   . Cancer Sister     unknown type   History  Substance Use Topics  . Smoking status: Former Smoker    Quit date: 11/15/1964  . Smokeless tobacco: Never Used  . Alcohol Use: No    Review of Systems  Constitutional: Negative for fever, chills and fatigue.  HENT: Negative for trouble swallowing.   Eyes: Negative for visual disturbance.  Respiratory: Negative for shortness of breath.   Cardiovascular: Negative for chest pain and palpitations.  Gastrointestinal: Negative for nausea, vomiting, abdominal pain and diarrhea.  Genitourinary: Negative for dysuria and difficulty urinating.  Musculoskeletal: Positive for arthralgias and joint swelling. Negative for neck pain.  Skin: Positive for wound. Negative for color change.  Neurological: Negative for dizziness and weakness.  Psychiatric/Behavioral: Negative for dysphoric mood.      Allergies  Citalopram hydrobromide; Divalproex sodium; Methocarbamol; Other; Oxycodone-acetaminophen; Paroxetine; Percocet; Risperidone; Smz-tmp ds; and Wellbutrin  Home Medications   Current Outpatient Rx  Name  Route  Sig  Dispense  Refill  . asenapine (SAPHRIS) 5 MG SUBL   Sublingual   Place 5 mg under the tongue at bedtime.          . benztropine (COGENTIN) 2 MG tablet   Oral   Take 2 mg by   mouth every evening.          . budesonide-formoterol (SYMBICORT) 160-4.5 MCG/ACT inhaler   Inhalation   Inhale 2 puffs into the lungs daily.          . clonazePAM (KLONOPIN) 0.5 MG tablet   Oral   Take 0.5 mg by mouth 4 (four) times daily.          . clopidogrel (PLAVIX) 75 MG tablet   Oral   Take 75 mg by mouth every evening.         . DULoxetine (CYMBALTA) 60 MG capsule   Oral   Take 60 mg by mouth every morning.          . enalapril (VASOTEC) 2.5 MG tablet      TAKE 1 TABLET BY MOUTH DAILY.   30 tablet   6   . esomeprazole (NEXIUM) 40  MG capsule   Oral   Take 40 mg by mouth every evening.         . fexofenadine (ALLEGRA) 180 MG tablet   Oral   Take 180 mg by mouth every morning.          . fluticasone (FLONASE) 50 MCG/ACT nasal spray   Each Nare   Place 1 spray into both nostrils daily.         [name] gabapentin (NEURONTIN) 300 MG capsule   Oral   Take 300 mg by mouth 4 (four) times daily.          . LamoTRIgine (LAMICTAL XR) 50 MG TB24   Oral   Take 50 mg by mouth every evening.          . meclizine (ANTIVERT) 25 MG tablet   Oral   Take 25 mg by mouth 3 (three) times daily as needed for dizziness.         . Multiple Vitamins-Minerals ([employer] SPECTRAVITE SENIOR) TABS   Oral   Take 1 tablet by mouth every morning.          . naproxen (NAPROSYN) 500 MG tablet   Oral   Take 1 tablet (500 mg total) by mouth 2 (two) times daily with a meal.   60 tablet   1   . pioglitazone-metformin (ACTOPLUS MET) 15-850 MG per tablet      TAKE 1 TABLET BY MOUTH 2 TIMES DAILY WITH MEALS   60 tablet   5   . simvastatin (ZOCOR) 40 MG tablet   Oral   Take 40 mg by mouth daily.         . traZODone (DESYREL) 50 MG tablet   Oral   Take 50 mg by mouth at bedtime as needed for sleep.          BP 124/74  Pulse 81  Temp(Src) 97.7 F (36.5 C) (Oral)  Resp 20  Ht 5' 9" (1.753 m)  Wt 260 lb (117.935 kg)  BMI 38.38 kg/m2  SpO2 98% Physical Exam  Nursing note and vitals reviewed. Constitutional: He is oriented to person, place, and time. He appears well-developed and well-nourished. No distress.  HENT:  Head: Normocephalic and atraumatic.  Eyes: Conjunctivae and EOM are normal.  Neck: Normal range of motion.  Cardiovascular: Normal rate and regular rhythm.  Exam reveals no gallop and no friction rub.   No murmur heard. Pulmonary/Chest: Effort normal and breath sounds normal. He has no wheezes. He has no rales. He exhibits no tenderness.  Musculoskeletal: Normal range of motion.  Anterior left knee  tenderness to  palpation and edema. No obvious deformity. ROM slightly limited due to pain.   Neurological: He is alert and oriented to person, place, and time. Coordination normal.  Speech is goal-oriented. Moves limbs without ataxia.   Skin: Skin is warm and dry.  Warm and erythematous skin overlying left knee with abrasion noted.   Psychiatric: He has a normal mood and affect. His behavior is normal.    ED Course  Procedures (including critical care time) Labs Review Labs Reviewed - No data to display Imaging Review Dg Knee Complete 4 Views Left  12/25/2013   CLINICAL DATA:  Left anterior knee pain with bruising and abrasion due to multiple falls.  EXAM: LEFT KNEE - COMPLETE 4+ VIEW  COMPARISON:  None.  FINDINGS: Components of the total knee prosthesis appear in good position. There is no fracture. There are multiple small calcifications in the joint. There is prominent prepatellar soft tissue swelling which could represent prepatellar bursitis.  IMPRESSION: Soft tissue swelling anterior to the patella, possibly prepatellar bursitis. No acute osseous abnormality. Multiple small calcified loose bodies in the joint.   Electronically Signed   By: Rozetta Nunnery M.D.   On: 12/25/2013 12:58    EKG Interpretation   None       MDM   Final diagnoses:  None    1:25 PM Patient likely has pain from prepatellar bursitis. Xray negative for fracture. Patient will have antibiotics for possible skin infection due to warmth and redness of the skin associated with abrasion. Patient will have naprosyn here. Patient will have Keflex prescription for possible infection due to abrasion and diabetes history. Patient reports he will take naprosyn at home for pain. Patient advised to rest, ice and elevate the knee.     Alvina Chou, PA-C 12/25/13 1335

## 2013-12-25 NOTE — ED Notes (Signed)
Fell down steps 12/21/13-pain, redness, swelling to right knee-pt ambulated to tx area with slow steady gait-refused w/c

## 2013-12-25 NOTE — Discharge Instructions (Signed)
Take Naprosyn as needed for pain. Take Keflex as directed until gone. Apply ice, rest, and elevate your knee. Refer to attached documents for more information. Follow up with your doctor as needed.

## 2013-12-26 ENCOUNTER — Ambulatory Visit: Payer: Medicare Other | Admitting: Physical Therapy

## 2013-12-27 ENCOUNTER — Encounter: Payer: Self-pay | Admitting: Family Medicine

## 2013-12-27 ENCOUNTER — Ambulatory Visit (INDEPENDENT_AMBULATORY_CARE_PROVIDER_SITE_OTHER): Payer: Medicare Other | Admitting: Family Medicine

## 2013-12-27 VITALS — BP 122/68 | HR 71 | Temp 98.1°F | Resp 16 | Wt 260.2 lb

## 2013-12-27 DIAGNOSIS — E119 Type 2 diabetes mellitus without complications: Secondary | ICD-10-CM

## 2013-12-27 DIAGNOSIS — Z23 Encounter for immunization: Secondary | ICD-10-CM

## 2013-12-27 DIAGNOSIS — L84 Corns and callosities: Secondary | ICD-10-CM | POA: Insufficient documentation

## 2013-12-27 LAB — HEMOGLOBIN A1C: Hgb A1c MFr Bld: 6.4 % (ref 4.6–6.5)

## 2013-12-27 LAB — BASIC METABOLIC PANEL
BUN: 26 mg/dL — ABNORMAL HIGH (ref 6–23)
CO2: 27 mEq/L (ref 19–32)
Calcium: 8.7 mg/dL (ref 8.4–10.5)
Chloride: 109 mEq/L (ref 96–112)
Creatinine, Ser: 0.8 mg/dL (ref 0.4–1.5)
GFR: 97.3 mL/min (ref >60.00)
Glucose, Bld: 113 mg/dL — ABNORMAL HIGH (ref 70–99)
Potassium: 4 mEq/L (ref 3.5–5.1)
Sodium: 142 mEq/L (ref 135–145)

## 2013-12-27 NOTE — Patient Instructions (Signed)
Schedule your complete physical in 3 months We'll notify you of your lab results and make any changes if needed We'll complete the packet for the diabetic shoes and inserts Call with any questions or concerns Happy Valentine's Day!!

## 2013-12-27 NOTE — Assessment & Plan Note (Signed)
New.  Refer to podiatry for ongoing evaluation and tx.

## 2013-12-27 NOTE — Progress Notes (Signed)
Pre visit review using our clinic review tool, if applicable. No additional management support is needed unless otherwise documented below in the visit note. 

## 2013-12-27 NOTE — Assessment & Plan Note (Signed)
Chronic problem.  Typically well controlled.  Pt w/ hx of neuropathy- on neurontin but this is progressing.  Agree w/ diabetic shoes for pt.  Due to callous formation, will refer to Podiatry.  On ACE for renal protection.  UTD on eye exam.  Check labs.  Adjust meds prn

## 2013-12-27 NOTE — Progress Notes (Signed)
   Subjective:    Patient ID: Ryan Zhang, male    DOB: [birth year], 68 y.o.   MRN: [id number]  HPI DM- chronic problem, on ActoplusMet.  On ACE for renal protection.  + neuropathy, pt reports this is worsening.  On Neurontin.  Pt has form for diabetic shoes today.  Not checking CBGs.  No symptomatic lows unless pt doesn't eat.  UTD on eye exam.  No CP, + SOB w/ exertion due to asthma, no HAs, visual changes, edema.   Review of Systems For ROS see HPI     Objective:   Physical Exam  Vitals reviewed. Constitutional: He is oriented to person, place, and time. He appears well-developed and well-nourished. No distress.  HENT:  Head: Normocephalic and atraumatic.  Eyes: Conjunctivae and EOM are normal. Pupils are equal, round, and reactive to light.  Neck: Normal range of motion. Neck supple. No thyromegaly present.  Cardiovascular: Normal rate, regular rhythm, normal heart sounds and intact distal pulses.   No murmur heard. Pulmonary/Chest: Effort normal and breath sounds normal. No respiratory distress.  Abdominal: Soft. Bowel sounds are normal. He exhibits no distension.  Musculoskeletal: He exhibits no edema.  Lymphadenopathy:    He has no cervical adenopathy.  Neurological: He is alert and oriented to person, place, and time. No cranial nerve deficit.  Skin: Skin is warm and dry.  Extensive callous formation on L big toe  Psychiatric: He has a normal mood and affect. His behavior is normal.          Assessment & Plan:

## 2013-12-28 ENCOUNTER — Other Ambulatory Visit: Payer: Self-pay | Admitting: General Practice

## 2013-12-28 ENCOUNTER — Telehealth: Payer: Self-pay

## 2013-12-28 DIAGNOSIS — E1142 Type 2 diabetes mellitus with diabetic polyneuropathy: Secondary | ICD-10-CM

## 2013-12-28 NOTE — Telephone Encounter (Signed)
Relevant patient education assigned to patient using Emmi. ° °

## 2013-12-31 ENCOUNTER — Ambulatory Visit: Payer: Medicare Other | Admitting: Physical Therapy

## 2014-01-02 ENCOUNTER — Ambulatory Visit: Payer: Medicare Other | Admitting: Physical Therapy

## 2014-01-09 ENCOUNTER — Encounter: Payer: Self-pay | Admitting: Family Medicine

## 2014-01-14 ENCOUNTER — Encounter: Payer: Self-pay | Admitting: Podiatry

## 2014-01-14 ENCOUNTER — Ambulatory Visit (INDEPENDENT_AMBULATORY_CARE_PROVIDER_SITE_OTHER): Payer: Medicare Other | Admitting: Podiatry

## 2014-01-14 ENCOUNTER — Other Ambulatory Visit: Payer: Self-pay | Admitting: *Deleted

## 2014-01-14 VITALS — BP 130/83 | HR 76 | Resp 18

## 2014-01-14 DIAGNOSIS — L84 Corns and callosities: Secondary | ICD-10-CM

## 2014-01-14 DIAGNOSIS — E1149 Type 2 diabetes mellitus with other diabetic neurological complication: Secondary | ICD-10-CM

## 2014-01-14 DIAGNOSIS — R0989 Other specified symptoms and signs involving the circulatory and respiratory systems: Secondary | ICD-10-CM

## 2014-01-14 NOTE — Progress Notes (Signed)
   Subjective:    Patient ID: Ryan Zhang, male    DOB: 09-Dec-1945, 68 y.o.   MRN: [id number]  HPI I am a diabetic and have been for about 20 years and did have panceaitis and I think that is what started the diabetes and BS is around 160 before medicines and after is around 100 after medicines and the two big toes are cracking around the nails and I think it is a callus and the toenail on the left is over grown and numbness and tingling and sore and tender.    Review of Systems  HENT: Positive for hearing loss.   Allergic/Immunologic: Positive for environmental allergies.  Hematological: Bruises/bleeds easily.  Psychiatric/Behavioral: The patient is nervous/anxious.   All other systems reviewed and are negative.       Objective:   Physical Exam Orientated x3 white male  Vascular: DP is are 2/4 bilaterally PT left is 0/4. PTs right 2/4.  Neurological: Sensation to 10 g monofilament wire intact 3/5 right and 4/5 left. Vibratory sensation diminished bilaterally. Ankle reflexes are reactive bilaterally.  Dermatological: The skin appears somewhat dry especially around that hallux bilaterally. There are no open lesions to  Musculoskeletal: No deformities are noted.        Assessment & Plan:   Assessment: Loss of protective sensation Diabetic peripheral neuropathy Rule out peripheral arterial disease because of nonpalpable left posterior tibial pulse Xerosis hallux skin bilaterally  Plan: Patient is referred to vascular lab for lower extremity arterial Doppler for the indication of diabetes in nonpalpable pedal pulses left posterior tibial. Advised patient apply cocoa butter to the dry skin on his hallux on a daily basis. General Information about diabetic footcare provided to patient today  Reappoint patient up on receipt of arterial examination.

## 2014-01-14 NOTE — Patient Instructions (Signed)
Apply cocoa butter daily to the dry skin on the big toes and cover with saran wrap at night until skin looks normal. The vascular lab will contact you for your lower extremity arterial Doppler.    Diabetes and Foot Care Diabetes may cause you to have problems because of poor blood supply (circulation) to your feet and legs. This may cause the skin on your feet to become thinner, break easier, and heal more slowly. Your skin may become dry, and the skin may peel and crack. You may also have nerve damage in your legs and feet causing decreased feeling in them. You may not notice minor injuries to your feet that could lead to infections or more serious problems. Taking care of your feet is one of the most important things you can do for yourself.  HOME CARE INSTRUCTIONS  Wear shoes at all times, even in the house. Do not go barefoot. Bare feet are easily injured.  Check your feet daily for blisters, cuts, and redness. If you cannot see the bottom of your feet, use a mirror or ask someone for help.  Wash your feet with warm water (do not use hot water) and mild soap. Then pat your feet and the areas between your toes until they are completely dry. Do not soak your feet as this can dry your skin.  Apply a moisturizing lotion or petroleum jelly (that does not contain alcohol and is unscented) to the skin on your feet and to dry, brittle toenails. Do not apply lotion between your toes.  Trim your toenails straight across. Do not dig under them or around the cuticle. File the edges of your nails with an emery board or nail file.  Do not cut corns or calluses or try to remove them with medicine.  Wear clean socks or stockings every day. Make sure they are not too tight. Do not wear knee-high stockings since they may decrease blood flow to your legs.  Wear shoes that fit properly and have enough cushioning. To break in new shoes, wear them for just a few hours a day. This prevents you from injuring  your feet. Always look in your shoes before you put them on to be sure there are no objects inside.  Do not cross your legs. This may decrease the blood flow to your feet.  If you find a minor scrape, cut, or break in the skin on your feet, keep it and the skin around it clean and dry. These areas may be cleansed with mild soap and water. Do not cleanse the area with peroxide, alcohol, or iodine.  When you remove an adhesive bandage, be sure not to damage the skin around it.  If you have a wound, look at it several times a day to make sure it is healing.  Do not use heating pads or hot water bottles. They may burn your skin. If you have lost feeling in your feet or legs, you may not know it is happening until it is too late.  Make sure your health care provider performs a complete foot exam at least annually or more often if you have foot problems. Report any cuts, sores, or bruises to your health care provider immediately. SEEK MEDICAL CARE IF:   You have an injury that is not healing.  You have cuts or breaks in the skin.  You have an ingrown nail.  You notice redness on your legs or feet.  You feel burning or tingling  in your legs or feet.  You have pain or cramps in your legs and feet.  Your legs or feet are numb.  Your feet always feel cold. SEEK IMMEDIATE MEDICAL CARE IF:   There is increasing redness, swelling, or pain in or around a wound.  There is a red line that goes up your leg.  Pus is coming from a wound.  You develop a fever or as directed by your health care provider.  You notice a bad smell coming from an ulcer or wound. Document Released: 10/29/2000 Document Revised: 07/04/2013 Document Reviewed: 04/10/2013 [facility] Patient Information 2014 [place].

## 2014-01-15 ENCOUNTER — Encounter: Payer: Self-pay | Admitting: Podiatry

## 2014-01-17 ENCOUNTER — Ambulatory Visit (HOSPITAL_COMMUNITY)
Admission: RE | Admit: 2014-01-17 | Discharge: 2014-01-17 | Disposition: A | Discharge status: Home / Self Care | Payer: Medicare Other | Source: Ambulatory Visit | Attending: Cardiology | Admitting: Cardiology

## 2014-01-17 DIAGNOSIS — I739 Peripheral vascular disease, unspecified: Secondary | ICD-10-CM

## 2014-01-17 DIAGNOSIS — I998 Other disorder of circulatory system: Secondary | ICD-10-CM | POA: Insufficient documentation

## 2014-01-17 DIAGNOSIS — R0989 Other specified symptoms and signs involving the circulatory and respiratory systems: Secondary | ICD-10-CM

## 2014-01-17 NOTE — Progress Notes (Signed)
Lower Extremity Arterial Duplex Completed. °Brianna L Mazza,RVT °

## 2014-01-19 ENCOUNTER — Other Ambulatory Visit: Payer: Self-pay | Admitting: Family Medicine

## 2014-01-21 NOTE — Telephone Encounter (Signed)
Med filled.  

## 2014-01-29 ENCOUNTER — Telehealth: Payer: Self-pay | Admitting: *Deleted

## 2014-01-29 NOTE — Telephone Encounter (Signed)
Message copied by Enedina FinnerMEADOWS, Victor Granados J on Tue Jan 29, 2014  2:47 PM ------      Message from: Alphia Kava'CONNELL, VALERY D      Created: Tue Jan 29, 2014  9:02 AM       Amyrah Pinkhasov,  Please call these results and instructions to pt.  Don't forget to transfer the doctor's orders to the pt's phone message.  Joya SanValery      ----- Message -----         From: Santiago Bumpersichard Tuchman, DPM         Sent: 01/28/2014   8:24 AM           To: Marissa NestleValery D O'Connell, RN            Arterial Doppler on 01/25/2014 was reported as a normal arterial duplex Doppler.            Notify patient that vascular lab was adequate and no followup is indicated at this time.       ------

## 2014-01-29 NOTE — Telephone Encounter (Signed)
Per Dr. Leeanne Deeduchman, I called and left the patient a message via his wife that doppler study was normal, no follow up is needed at this time.

## 2014-02-04 ENCOUNTER — Other Ambulatory Visit: Payer: Self-pay | Admitting: Family Medicine

## 2014-02-04 NOTE — Telephone Encounter (Signed)
Med filled.  

## 2014-03-09 ENCOUNTER — Emergency Department (HOSPITAL_BASED_OUTPATIENT_CLINIC_OR_DEPARTMENT_OTHER)
Admission: EM | Admit: 2014-03-09 | Discharge: 2014-03-10 | Disposition: A | Discharge status: Home / Self Care | Payer: Medicare Other | Attending: Emergency Medicine | Admitting: Emergency Medicine

## 2014-03-09 ENCOUNTER — Encounter (HOSPITAL_BASED_OUTPATIENT_CLINIC_OR_DEPARTMENT_OTHER): Payer: Self-pay | Admitting: Emergency Medicine

## 2014-03-09 ENCOUNTER — Emergency Department (HOSPITAL_BASED_OUTPATIENT_CLINIC_OR_DEPARTMENT_OTHER): Payer: Medicare Other

## 2014-03-09 DIAGNOSIS — Z87891 Personal history of nicotine dependence: Secondary | ICD-10-CM | POA: Insufficient documentation

## 2014-03-09 DIAGNOSIS — Z8673 Personal history of transient ischemic attack (TIA), and cerebral infarction without residual deficits: Secondary | ICD-10-CM | POA: Insufficient documentation

## 2014-03-09 DIAGNOSIS — F319 Bipolar disorder, unspecified: Secondary | ICD-10-CM | POA: Insufficient documentation

## 2014-03-09 DIAGNOSIS — Z87442 Personal history of urinary calculi: Secondary | ICD-10-CM | POA: Insufficient documentation

## 2014-03-09 DIAGNOSIS — Z860100 Personal history of colon polyps, unspecified: Secondary | ICD-10-CM | POA: Insufficient documentation

## 2014-03-09 DIAGNOSIS — Z7902 Long term (current) use of antithrombotics/antiplatelets: Secondary | ICD-10-CM | POA: Insufficient documentation

## 2014-03-09 DIAGNOSIS — E119 Type 2 diabetes mellitus without complications: Secondary | ICD-10-CM | POA: Insufficient documentation

## 2014-03-09 DIAGNOSIS — R51 Headache: Secondary | ICD-10-CM | POA: Insufficient documentation

## 2014-03-09 DIAGNOSIS — IMO0002 Reserved for concepts with insufficient information to code with codable children: Secondary | ICD-10-CM | POA: Insufficient documentation

## 2014-03-09 DIAGNOSIS — E785 Hyperlipidemia, unspecified: Secondary | ICD-10-CM | POA: Insufficient documentation

## 2014-03-09 DIAGNOSIS — M1990 Unspecified osteoarthritis, unspecified site: Secondary | ICD-10-CM | POA: Insufficient documentation

## 2014-03-09 DIAGNOSIS — H8109 Meniere's disease, unspecified ear: Secondary | ICD-10-CM | POA: Insufficient documentation

## 2014-03-09 DIAGNOSIS — Z792 Long term (current) use of antibiotics: Secondary | ICD-10-CM | POA: Insufficient documentation

## 2014-03-09 DIAGNOSIS — K219 Gastro-esophageal reflux disease without esophagitis: Secondary | ICD-10-CM | POA: Insufficient documentation

## 2014-03-09 DIAGNOSIS — M549 Dorsalgia, unspecified: Secondary | ICD-10-CM | POA: Insufficient documentation

## 2014-03-09 DIAGNOSIS — E669 Obesity, unspecified: Secondary | ICD-10-CM | POA: Insufficient documentation

## 2014-03-09 DIAGNOSIS — R112 Nausea with vomiting, unspecified: Secondary | ICD-10-CM | POA: Insufficient documentation

## 2014-03-09 DIAGNOSIS — J45909 Unspecified asthma, uncomplicated: Secondary | ICD-10-CM | POA: Insufficient documentation

## 2014-03-09 DIAGNOSIS — Z8619 Personal history of other infectious and parasitic diseases: Secondary | ICD-10-CM | POA: Insufficient documentation

## 2014-03-09 LAB — CBC WITH DIFFERENTIAL/PLATELET
Basophils Absolute: 0 10*3/uL (ref 0.0–0.1)
Basophils Relative: 0 % (ref 0–1)
Eosinophils Absolute: 0.1 10*3/uL (ref 0.0–0.7)
Eosinophils Relative: 1 % (ref 0–5)
HCT: 38.4 % — ABNORMAL LOW (ref 39.0–52.0)
Hemoglobin: 12.8 g/dL — ABNORMAL LOW (ref 13.0–17.0)
Lymphocytes Relative: 14 % (ref 12–46)
Lymphs Abs: 1.7 10*3/uL (ref 0.7–4.0)
MCH: 27.9 pg (ref 26.0–34.0)
MCHC: 33.3 g/dL (ref 30.0–36.0)
MCV: 83.7 fL (ref 78.0–100.0)
Monocytes Absolute: 1.1 10*3/uL — ABNORMAL HIGH (ref 0.1–1.0)
Monocytes Relative: 9 % (ref 3–12)
Neutro Abs: 9.1 10*3/uL — ABNORMAL HIGH (ref 1.7–7.7)
Neutrophils Relative %: 76 % (ref 43–77)
Platelets: 206 10*3/uL (ref 150–400)
RBC: 4.59 MIL/uL (ref 4.22–5.81)
RDW: 14.9 % (ref 11.5–15.5)
WBC: 12 10*3/uL — ABNORMAL HIGH (ref 4.0–10.5)

## 2014-03-09 LAB — COMPREHENSIVE METABOLIC PANEL
ALT: 14 U/L (ref 0–53)
AST: 17 U/L (ref 0–37)
Albumin: 3.5 g/dL (ref 3.5–5.2)
Alkaline Phosphatase: 72 U/L (ref 39–117)
BUN: 19 mg/dL (ref 6–23)
CO2: 23 mEq/L (ref 19–32)
Calcium: 9 mg/dL (ref 8.4–10.5)
Chloride: 102 mEq/L (ref 96–112)
Creatinine, Ser: 1 mg/dL (ref 0.50–1.35)
GFR calc Af Amer: 89 mL/min — ABNORMAL LOW (ref >90)
GFR calc non Af Amer: 77 mL/min — ABNORMAL LOW (ref >90)
Glucose, Bld: 136 mg/dL — ABNORMAL HIGH (ref 70–99)
Potassium: 3.7 mEq/L (ref 3.7–5.3)
Sodium: 139 mEq/L (ref 137–147)
Total Bilirubin: 1.8 mg/dL — ABNORMAL HIGH (ref 0.3–1.2)
Total Protein: 6.4 g/dL (ref 6.0–8.3)

## 2014-03-09 LAB — LIPASE, BLOOD: Lipase: 7 U/L — ABNORMAL LOW (ref 11–59)

## 2014-03-09 MED ORDER — ONDANSETRON HCL 4 MG/2ML IJ SOLN
4.0000 mg | Freq: Once | INTRAMUSCULAR | Status: AC
  Administered 2014-03-09: 4 mg via INTRAVENOUS
  Filled 2014-03-09: qty 2

## 2014-03-09 MED ORDER — SODIUM CHLORIDE 0.9 % IV SOLN
INTRAVENOUS | Status: DC
  Administered 2014-03-09: 23:00:00 via INTRAVENOUS

## 2014-03-09 NOTE — ED Notes (Signed)
Vomiting and dizziness x 1 day

## 2014-03-09 NOTE — ED Provider Notes (Signed)
CSN: [id number]     Arrival date & time 03/09/14  2206 History  This chart was scribed for Ryan Fines, MD by Marcha Dutton, ED Scribe. This patient was seen in room MH05/MH05 and the patient's care was started at 10:58 PM.    Chief Complaint  Patient presents with  . Vomiting       The history is provided by the patient. No language interpreter was used.  HPI Comments: Ryan Zhang is a 68 y.o. male who presents to the Emergency Department complaining of multiple episodes of vomiting today. Pt states he woke up at 7 AM feeling "unwell" and states he vomited twice in the morning. He reports associated nausea, dizziness, a constant HA, abdominal pain, back pain, diarrhea. Pt reports he rarely has HAs, that his headache is the worst he's ever felt, and it came on suddenly. Pt states he is currently more nauseous than he has been all day. Pt reports he has Meniere's disease but states this is different than his usual vertigo. He states he has asthma and has a near constant wheeze, which is not abnormal. Pt denies profuse BM and chest pain. Pt states his pain is not bad, but his headache is bothering him the most.   Past Medical History  Diagnosis Date  . Diabetes mellitus   . Hyperlipidemia   . Hypertension   . Bipolar affective   . Allergy     allergy shots Dr. Velora Heckler  . DJD (degenerative joint disease)   . Obesity   . Asthma   . GERD (gastroesophageal reflux disease)   . Meniere's disease   . Hollenhorst plaque     right eye  . Personal history of colonic polyps 11/2004    hyperplastic.  [name] Dysphagia   . Kidney stones   . Pancreatitis   . Hepatitis A     as teenager 85's  . Gallstones   . Motility disorder, esophageal   . Stroke     per MRI    Past Surgical History  Procedure Laterality Date  . Cholecystectomy    . Total knee arthroplasty      both knees    Family History  Problem Relation Age of Onset  . Diabetes Father   . Heart disease Maternal  Grandmother   . Colon cancer Neg Hx   . Cancer Sister     unknown type    History  Substance Use Topics  . Smoking status: Former Smoker    Quit date: 11/15/1964  . Smokeless tobacco: Never Used  . Alcohol Use: No    Review of Systems A complete 10 system review of systems was obtained and all systems are negative except as noted in the HPI and PMH.    Allergies  Citalopram hydrobromide; Divalproex sodium; Methocarbamol; Other; Oxycodone-acetaminophen; Paroxetine; Percocet; Risperidone; Smz-tmp ds; and Wellbutrin  Home Medications   Prior to Admission medications   Medication Sig Start Date End Date Taking? Authorizing Provider  asenapine (SAPHRIS) 5 MG SUBL Place 5 mg under the tongue at bedtime.     Historical Provider, MD  benztropine (COGENTIN) 2 MG tablet Take 2 mg by mouth every evening.     Historical Provider, MD  budesonide-formoterol (SYMBICORT) 160-4.5 MCG/ACT inhaler Inhale 2 puffs into the lungs daily.     Historical Provider, MD  cephALEXin (KEFLEX) 500 MG capsule Take 1 capsule (500 mg total) by mouth 4 (four) times daily. 12/25/13   Kaitlyn Szekalski, PA-C  clonazePAM (KLONOPIN) 0.5  MG tablet Take 0.5 mg by mouth 4 (four) times daily.     Historical Provider, MD  clopidogrel (PLAVIX) 75 MG tablet TAKE 1 TABLET BY MOUTH ONCE DAILY    Midge Minium, MD  DULoxetine (CYMBALTA) 60 MG capsule Take 60 mg by mouth every morning.     Historical Provider, MD  enalapril (VASOTEC) 2.5 MG tablet TAKE 1 TABLET BY MOUTH DAILY. 10/08/13   Midge Minium, MD  esomeprazole (NEXIUM) 40 MG capsule Take 40 mg by mouth every evening.    Historical Provider, MD  fexofenadine (ALLEGRA) 180 MG tablet Take 180 mg by mouth every morning.     Historical Provider, MD  fluticasone (FLONASE) 50 MCG/ACT nasal spray Place 1 spray into both nostrils daily.    Historical Provider, MD  gabapentin (NEURONTIN) 300 MG capsule Take 300 mg by mouth 4 (four) times daily.     Historical Provider, MD   LamoTRIgine (LAMICTAL XR) 50 MG TB24 Take 50 mg by mouth every evening.     Historical Provider, MD  meclizine (ANTIVERT) 25 MG tablet Take 25 mg by mouth 3 (three) times daily as needed for dizziness.    Historical Provider, MD  Multiple Vitamins-Minerals ([facility] SPECTRAVITE SENIOR) TABS Take 1 tablet by mouth every morning.     Historical Provider, MD  naproxen (NAPROSYN) 500 MG tablet Take 1 tablet (500 mg total) by mouth 2 (two) times daily with a meal. 09/25/13   Midge Minium, MD  pioglitazone-metformin (ACTOPLUS MET) [phone] MG per tablet TAKE 1 TABLET BY MOUTH 2 TIMES DAILY WITH MEALS 12/22/13   Midge Minium, MD  simvastatin (ZOCOR) 40 MG tablet TAKE 1 TABLET BY MOUTH ATBEDTIME. 02/04/14   Midge Minium, MD  traZODone (DESYREL) 50 MG tablet Take 50 mg by mouth at bedtime as needed for sleep.    Historical Provider, MD   Triage Vitals: BP 126/68  Pulse 92  Temp(Src) 97.7 F (36.5 C) (Oral)  Resp 23  SpO2 98%  Physical Exam  Nursing note and vitals reviewed. General: Well-developed, well-nourished male in no acute distress; appearance consistent with age of record HENT: normocephalic; atraumatic, mucous membranes dry Eyes: pupils equal, round and reactive to light; extraocular muscles intact Neck: supple Heart: regular rate and rhythm; no murmurs, rubs or gallops Lungs: clear to auscultation bilaterally Abdomen: soft; nondistended; tender in the epigastrium and suprapubic region; no masses or hepatosplenomegaly; bowel sounds present Extremities: No deformity; full range of motion; pulses normal Neurologic: Awake, alert and oriented; motor function intact in all extremities and symmetric; no facial droop Skin: Warm and dry Psychiatric: Normal mood and affect  . ED Course  Procedures (including critical care time) DIAGNOSTIC STUDIES: Oxygen Saturation is 98% on RA, normal by my interpretation.    COORDINATION OF CARE: 11:06 PM- Pt advised of plan for treatment and  pt agrees.   MDM   Nursing notes and vitals signs, including pulse oximetry, reviewed.  Summary of this visit's results, reviewed by myself:  Labs:  Results for orders placed during the hospital encounter of 03/09/14 (from the past 24 hour(s))  CBC WITH DIFFERENTIAL     Status: Abnormal   Collection Time    03/09/14 11:05 PM      Result Value Ref Range   WBC 12.0 (*) 4.0 - 10.5 K/uL   RBC 4.59  4.22 - 5.81 MIL/uL   Hemoglobin 12.8 (*) 13.0 - 17.0 g/dL   HCT 38.4 (*) 39.0 - 52.0 %   MCV  83.7  78.0 - 100.0 fL   MCH 27.9  26.0 - 34.0 pg   MCHC 33.3  30.0 - 36.0 g/dL   RDW 14.9  11.5 - 15.5 %   Platelets 206  150 - 400 K/uL   Neutrophils Relative % 76  43 - 77 %   Neutro Abs 9.1 (*) 1.7 - 7.7 K/uL   Lymphocytes Relative 14  12 - 46 %   Lymphs Abs 1.7  0.7 - 4.0 K/uL   Monocytes Relative 9  3 - 12 %   Monocytes Absolute 1.1 (*) 0.1 - 1.0 K/uL   Eosinophils Relative 1  0 - 5 %   Eosinophils Absolute 0.1  0.0 - 0.7 K/uL   Basophils Relative 0  0 - 1 %   Basophils Absolute 0.0  0.0 - 0.1 K/uL  COMPREHENSIVE METABOLIC PANEL     Status: Abnormal   Collection Time    03/09/14 11:05 PM      Result Value Ref Range   Sodium 139  137 - 147 mEq/L   Potassium 3.7  3.7 - 5.3 mEq/L   Chloride 102  96 - 112 mEq/L   CO2 23  19 - 32 mEq/L   Glucose, Bld 136 (*) 70 - 99 mg/dL   BUN 19  6 - 23 mg/dL   Creatinine, Ser 1.00  0.50 - 1.35 mg/dL   Calcium 9.0  8.4 - 10.5 mg/dL   Total Protein 6.4  6.0 - 8.3 g/dL   Albumin 3.5  3.5 - 5.2 g/dL   AST 17  0 - 37 U/L   ALT 14  0 - 53 U/L   Alkaline Phosphatase 72  39 - 117 U/L   Total Bilirubin 1.8 (*) 0.3 - 1.2 mg/dL   GFR calc non Af Amer 77 (*) >90 mL/min   GFR calc Af Amer 89 (*) >90 mL/min  LIPASE, BLOOD     Status: Abnormal   Collection Time    03/09/14 11:05 PM      Result Value Ref Range   Lipase 7 (*) 11 - 59 U/L    Imaging Studies: Ct Head Wo Contrast  03/10/2014   CLINICAL DATA:  Vomiting, nausea, dizziness, constant headache,  abdominal pain, back pain, personal history of diabetes, hypertension, asthma, Meniere's disease, history stroke  EXAM: CT HEAD WITHOUT CONTRAST  TECHNIQUE: Contiguous axial images were obtained from the base of the skull through the vertex without intravenous contrast.  COMPARISON:  10/06/2013  FINDINGS: Motion artifact despite repeat imaging.  Generalized atrophy.  Normal ventricular morphology.  No midline shift or mass effect.  Otherwise grossly normal appearance of brain parenchyma.  No definite intracranial hemorrhage, mass lesion, or evidence acute infarction.  Bones and sinuses unremarkable.  IMPRESSION: Atrophy.  No definite acute intracranial abnormalities.   Electronically Signed   By: Lavonia Dana M.D.   On: 03/10/2014 00:26   12:36 AM Abdomen soft, nontender. Patient states his nausea has returned and his head continues to hurt.  1:40 AM Abdomen soft, nontender. Nausea relieved by 5 mg of Reglan IV. Headache improving. Patient states he is hungry and feels ready to go home.   I personally performed the services described in this documentation, which was scribed in my presence. The recorded information has been reviewed and is accurate.   Ryan Fines, MD 03/10/14 0140

## 2014-03-10 MED ORDER — PROMETHAZINE HCL 12.5 MG RE SUPP: 12.5000 mg | Freq: Four times a day (QID) | RECTAL | Status: DC | PRN

## 2014-03-10 MED ORDER — ACETAMINOPHEN 325 MG PO TABS
650.0000 mg | ORAL_TABLET | Freq: Once | ORAL | Status: AC
  Administered 2014-03-10: 650 mg via ORAL
  Filled 2014-03-10: qty 2

## 2014-03-10 MED ORDER — SODIUM CHLORIDE 0.9 % IV BOLUS (SEPSIS)
1000.0000 mL | Freq: Once | INTRAVENOUS | Status: AC
  Administered 2014-03-10: 1000 mL via INTRAVENOUS

## 2014-03-10 MED ORDER — METOCLOPRAMIDE HCL 5 MG/ML IJ SOLN
5.0000 mg | Freq: Once | INTRAMUSCULAR | Status: AC
  Administered 2014-03-10: 5 mg via INTRAVENOUS
  Filled 2014-03-10: qty 2

## 2014-03-10 NOTE — Discharge Instructions (Signed)

## 2014-03-10 NOTE — ED Notes (Signed)
D/c home with rise- rx x 1 given for phenergan

## 2014-03-11 ENCOUNTER — Telehealth: Payer: Self-pay | Admitting: Family Medicine

## 2014-03-11 NOTE — Telephone Encounter (Addendum)
Called patient back to schedule follow-up hospital appointment.  Patient was not home. Wife, Ryan Zhang, answered the phone.  She shared that she encouraged her husband to call.  Patient was seen in the ED on 4/25 for nausea and vomiting, abd pain, chills, severe headache ("felt like an electric shock going through his head"), and dizziness.  He was treated and evaluated. They r/o aneurysm and stroke. At discharge, he was given phenergan suppositories.  [facility]ent to [facility] to [facility], but was told that they could not have it filled because it was written by hospitalist and not by a doctor.   Please advise.   Wife was asked to have Mr. Ryan Zhang call back to schedule an appointment.    Mr. Ryan Zhang called back around 4:55 pm   Gave the same report.  Denies nausea, vomiting, and diarrhea.    Currently constipated.  Encouraged to drink plenty of fluids and add miralax as needed for comfort.      Would also like to see Dr. Beverely Lowabori about his left shoulder pain.  Patient has seen Dr. Beverely Lowabori for this issue before.    Appt scheduled for 03/15/14 @ 1130.

## 2014-03-11 NOTE — Telephone Encounter (Signed)
My Chart Message Appointment Request From: Doristine DevoidJohn K Suazo      With Provider: Neena RhymesKatherine Tabori, MD [-Primary Care Physician-]      Preferred Date Range: From 03/11/2014 To 03/14/2014      Preferred Times: Monday Morning, Tuesday Morning, Wednesday Morning, Thursday Morning, Friday Morning, Monday Afternoon, Tuesday Afternoon, Wednesday Afternoon, Thursday Afternoon, Friday Afternoon      Reason for visit: Office Visit      Comments:   Follow up to emergency room visit on 4/25 and pain in left shoulder and arm.

## 2014-03-12 NOTE — Telephone Encounter (Addendum)
Spoke with patient.  Dr. Rennis Goldenabori's advice was read to patient.  He stated understanding and stated that he would make the appointment with Ortho.    He is currently not feeling well. No c/o of nausea/vomiting, but is having diarrhea.  He was advised to drink adequate fluids, to eat crackers, dry toast, and fiber rich foods, and take Imodium as needed.  He was also advised to call back for an appointment if symptoms persist or worsen.  He agreed. No further questions or concerns voiced.

## 2014-03-12 NOTE — Telephone Encounter (Signed)
If pt is not having nausea or vomiting would hold on phenergan suppository b/c it could further constipate him.  I can certainly see pt for his shoulder pain but it may be more efficient/effective for him to go straight to Ortho for evaluation and tx.  If he has ortho, he can call and schedule but if not, we can refer.

## 2014-03-12 NOTE — Telephone Encounter (Signed)
Called patient to see if he was feeling better. Patient stated that he was doing much better, symptoms have resolved and no needs at this time.  Patient was encouraged to call if his symptoms return or worsen.  No further questions or concerns voiced.

## 2014-03-15 ENCOUNTER — Ambulatory Visit: Payer: Medicare Other | Admitting: Family Medicine

## 2014-03-27 ENCOUNTER — Encounter: Payer: Medicare Other | Admitting: Family Medicine

## 2014-04-04 ENCOUNTER — Encounter: Payer: Self-pay | Admitting: Family Medicine

## 2014-04-04 ENCOUNTER — Ambulatory Visit (INDEPENDENT_AMBULATORY_CARE_PROVIDER_SITE_OTHER): Payer: Medicare Other | Admitting: Family Medicine

## 2014-04-04 VITALS — BP 112/74 | HR 87 | Temp 98.2°F | Resp 16 | Ht 69.5 in | Wt 254.2 lb

## 2014-04-04 DIAGNOSIS — Z125 Encounter for screening for malignant neoplasm of prostate: Secondary | ICD-10-CM

## 2014-04-04 DIAGNOSIS — E785 Hyperlipidemia, unspecified: Secondary | ICD-10-CM

## 2014-04-04 DIAGNOSIS — J45909 Unspecified asthma, uncomplicated: Secondary | ICD-10-CM

## 2014-04-04 DIAGNOSIS — I10 Essential (primary) hypertension: Secondary | ICD-10-CM

## 2014-04-04 DIAGNOSIS — E1142 Type 2 diabetes mellitus with diabetic polyneuropathy: Secondary | ICD-10-CM

## 2014-04-04 DIAGNOSIS — E1149 Type 2 diabetes mellitus with other diabetic neurological complication: Secondary | ICD-10-CM

## 2014-04-04 DIAGNOSIS — Z0000 Encounter for general adult medical examination without abnormal findings: Secondary | ICD-10-CM

## 2014-04-04 LAB — CBC WITH DIFFERENTIAL/PLATELET
Basophils Absolute: 0 10*3/uL (ref 0.0–0.1)
Basophils Relative: 0.6 % (ref 0.0–3.0)
Eosinophils Absolute: 0.2 10*3/uL (ref 0.0–0.7)
Eosinophils Relative: 3.3 % (ref 0.0–5.0)
HCT: 40.5 % (ref 39.0–52.0)
Hemoglobin: 13.2 g/dL (ref 13.0–17.0)
Lymphocytes Relative: 29 % (ref 12.0–46.0)
Lymphs Abs: 2 10*3/uL (ref 0.7–4.0)
MCHC: 32.6 g/dL (ref 30.0–36.0)
MCV: 83.5 fl (ref 78.0–100.0)
Monocytes Absolute: 0.5 10*3/uL (ref 0.1–1.0)
Monocytes Relative: 7.7 % (ref 3.0–12.0)
Neutro Abs: 4.1 10*3/uL (ref 1.4–7.7)
Neutrophils Relative %: 59.4 % (ref 43.0–77.0)
Platelets: 258 10*3/uL (ref 150.0–400.0)
RBC: 4.85 Mil/uL (ref 4.22–5.81)
RDW: 16 % — ABNORMAL HIGH (ref 11.5–15.5)
WBC: 7 10*3/uL (ref 4.0–10.5)

## 2014-04-04 LAB — LIPID PANEL
Cholesterol: 124 mg/dL (ref 0–200)
HDL: 42.1 mg/dL (ref >39.00)
LDL Cholesterol: 60 mg/dL (ref 0–99)
Total CHOL/HDL Ratio: 3
Triglycerides: 110 mg/dL (ref 0.0–149.0)
VLDL: 22 mg/dL (ref 0.0–40.0)

## 2014-04-04 LAB — HEPATIC FUNCTION PANEL
ALT: 14 U/L (ref 0–53)
AST: 20 U/L (ref 0–37)
Albumin: 3.8 g/dL (ref 3.5–5.2)
Alkaline Phosphatase: 73 U/L (ref 39–117)
Bilirubin, Direct: 0.2 mg/dL (ref 0.0–0.3)
Total Bilirubin: 1.1 mg/dL (ref 0.2–1.2)
Total Protein: 6.5 g/dL (ref 6.0–8.3)

## 2014-04-04 LAB — BASIC METABOLIC PANEL
BUN: 21 mg/dL (ref 6–23)
CO2: 27 mEq/L (ref 19–32)
Calcium: 9.3 mg/dL (ref 8.4–10.5)
Chloride: 105 mEq/L (ref 96–112)
Creatinine, Ser: 1.1 mg/dL (ref 0.4–1.5)
GFR: 72.74 mL/min (ref >60.00)
Glucose, Bld: 198 mg/dL — ABNORMAL HIGH (ref 70–99)
Potassium: 5.1 mEq/L (ref 3.5–5.1)
Sodium: 140 mEq/L (ref 135–145)

## 2014-04-04 LAB — PSA: PSA: 0.52 ng/mL (ref 0.10–4.00)

## 2014-04-04 LAB — TSH: TSH: 1.22 u[IU]/mL (ref 0.35–4.50)

## 2014-04-04 LAB — HEMOGLOBIN A1C: Hgb A1c MFr Bld: 6.3 % (ref 4.6–6.5)

## 2014-04-04 NOTE — Assessment & Plan Note (Signed)
Chronic problem, well controlled on Symbicort.  Only flares w/ URI.  Will follow.

## 2014-04-04 NOTE — Assessment & Plan Note (Signed)
Chronic problem, well controlled today.  On enalapril.  Asymptomatic.  Check labs.  No anticipated med changes

## 2014-04-04 NOTE — Progress Notes (Signed)
   Subjective:    Patient ID: Ryan Zhang, male    DOB: 02-20-1946, 68 y.o.   MRN: [id number]  HPI Here today for CPE.  Risk Factors: DM- chronic problem, on ActoplusMet.  On ACE daily.  Overdue for eye exam in July- plans to schedule.  + neuropathy HTN- chronic problem, on Enalapril.  Denies CP, SOB, HAs, visual changes, edema Hyperlipidemia- chronic problem, on Simvastatin.  Denies abd pain, N/V, myalgias Asthma- chronic problem, on Symbicort daily.  Currently well controlled.  sxs get severe when pt is sick Physical Activity: limited, no regular exercise Fall Risk: moderate due to neuropathy and passing out Depression: chronic problem, severe, seeing psych Hearing: decreased to conversational tones and whispered voice- wears hearing aides ADL's: independent Cognitive: normal linear thought process, memory and attention intact Home Safety: safe at home, lives w/ wife.  Has difficult situation w/ drug addicted daughter Height, Weight, BMI, Visual Acuity: see vitals, vision corrected to 20/20 w/ glasses Counseling: UTD on colonoscopy Labs Ordered: See A&P Care Plan: See A&P    Review of Systems Patient reports no vision/hearing changes, anorexia, fever ,adenopathy, persistant/recurrent hoarseness, chest pain, palpitations, edema, persistant/recurrent cough, hemoptysis, dyspnea (rest,exertional, paroxysmal nocturnal), gastrointestinal  bleeding (melena, rectal bleeding), abdominal pain, excessive heart burn, GU symptoms (dysuria, hematuria, voiding/incontinence issues) syncope, focal weakness, memory loss, numbness & tingling, skin/hair/nail changes, abnormal bruising/bleeding, musculoskeletal symptoms/signs.  Chronic swallowing issues    Objective:   Physical Exam BP 112/74  Pulse 87  Temp(Src) 98.2 F (36.8 C) (Oral)  Resp 16  Ht 5' 9.5" (1.765 m)  Wt 254 lb 4 oz (115.327 kg)  BMI 37.02 kg/m2  SpO2 95%  General Appearance:    Alert, cooperative, no distress, appears older  than stated age, obese  Head:    Normocephalic, without obvious abnormality, atraumatic  Eyes:    PERRL, conjunctiva/corneas clear, EOM's intact, fundi    benign, both eyes       Ears:    Normal TM's and external ear canals, both ears  Nose:   Nares normal, septum midline, mucosa normal, no drainage   or sinus tenderness  Throat:   Lips, mucosa, and tongue normal; teeth and gums normal  Neck:   Supple, symmetrical, trachea midline, no adenopathy;       thyroid:  No enlargement/tenderness/nodules  Back:     Symmetric, no curvature, ROM normal, no CVA tenderness  Lungs:     Clear to auscultation bilaterally, respirations unlabored  Chest wall:    No tenderness or deformity  Heart:    Regular rate and rhythm, S1 and S2 normal, no murmur, rub   or gallop  Abdomen:     Soft, non-tender, bowel sounds active all four quadrants,    no masses, no organomegaly  Genitalia:    Deferred at pt's request  Rectal:    Extremities:   Extremities normal, atraumatic, no cyanosis or edema  Pulses:   2+ and symmetric all extremities  Skin:   Skin color, texture, turgor normal, no rashes or lesions  Lymph nodes:   Cervical, supraclavicular, and axillary nodes normal  Neurologic:   CNII-XII intact. Normal strength, sensation and reflexes      throughout          Assessment & Plan:

## 2014-04-04 NOTE — Progress Notes (Signed)
Pre visit review using our clinic review tool, if applicable. No additional management support is needed unless otherwise documented below in the visit note. 

## 2014-04-04 NOTE — Assessment & Plan Note (Signed)
Chronic problem.  Tolerating statin w/o difficulty.  Check labs.  Adjust meds prn  

## 2014-04-04 NOTE — Assessment & Plan Note (Signed)
Pt's PE WNL w/ exception of obesity.  Deferred GU/rectal exam due to pt's high stress level today.  UTD on colonoscopy.  Check labs.  Anticipatory guidance provided.

## 2014-04-04 NOTE — Patient Instructions (Signed)
Follow up in 3-4 months to recheck sugar We'll notify you of your lab results and make any changes if needed Please call with any questions or concerns Hang in there!  You can do this!

## 2014-04-04 NOTE — Assessment & Plan Note (Signed)
Chronic problem.  UTD on eye exam.  Check labs.  Check labs.  Adjust meds prn

## 2014-05-11 ENCOUNTER — Other Ambulatory Visit: Payer: Self-pay | Admitting: Family Medicine

## 2014-05-13 NOTE — Telephone Encounter (Signed)
Med filled.  

## 2014-07-08 ENCOUNTER — Ambulatory Visit: Payer: Medicare Other | Admitting: Family Medicine

## 2014-07-08 ENCOUNTER — Other Ambulatory Visit: Payer: Self-pay | Admitting: Family Medicine

## 2014-07-08 NOTE — Telephone Encounter (Signed)
Med filled.  

## 2014-07-09 ENCOUNTER — Encounter: Payer: Self-pay | Admitting: Family Medicine

## 2014-07-09 ENCOUNTER — Ambulatory Visit (INDEPENDENT_AMBULATORY_CARE_PROVIDER_SITE_OTHER): Payer: Medicare Other | Admitting: Family Medicine

## 2014-07-09 VITALS — BP 138/82 | HR 73 | Temp 98.3°F | Resp 16 | Wt 244.2 lb

## 2014-07-09 DIAGNOSIS — E1149 Type 2 diabetes mellitus with other diabetic neurological complication: Secondary | ICD-10-CM

## 2014-07-09 DIAGNOSIS — E1142 Type 2 diabetes mellitus with diabetic polyneuropathy: Secondary | ICD-10-CM

## 2014-07-09 LAB — HEMOGLOBIN A1C: Hgb A1c MFr Bld: 6.4 % (ref 4.6–6.5)

## 2014-07-09 LAB — BASIC METABOLIC PANEL
BUN: 26 mg/dL — ABNORMAL HIGH (ref 6–23)
CO2: 27 mEq/L (ref 19–32)
Calcium: 9.1 mg/dL (ref 8.4–10.5)
Chloride: 104 mEq/L (ref 96–112)
Creatinine, Ser: 1 mg/dL (ref 0.4–1.5)
GFR: 83.27 mL/min (ref >60.00)
Glucose, Bld: 132 mg/dL — ABNORMAL HIGH (ref 70–99)
Potassium: 4.1 mEq/L (ref 3.5–5.1)
Sodium: 139 mEq/L (ref 135–145)

## 2014-07-09 MED ORDER — TRAZODONE HCL 50 MG PO TABS: 50.0000 mg | ORAL_TABLET | Freq: Every evening | ORAL | Status: DC | PRN

## 2014-07-09 NOTE — Progress Notes (Signed)
   Subjective:    Patient ID: Ryan Zhang, male    DOB: 1946/06/22, 68 y.o.   MRN: [id number]  HPI DM- chronic problem, not checking CBGs recently.  Under a lot of stress.  On ACE for renal protection.  On ActoplusMet.  UTD on eye exam.  Denies symptomatic lows.  No CP, SOB, HAs, visual changes, edema.   Review of Systems For ROS see HPI     Objective:   Physical Exam  Vitals reviewed. Constitutional: Ryan Zhang is oriented to person, place, and time. Ryan Zhang appears well-developed and well-nourished. No distress.  Obese, disheveled.  HENT:  Head: Normocephalic and atraumatic.  Eyes: Conjunctivae and EOM are normal. Pupils are equal, round, and reactive to light.  Neck: Normal range of motion. Neck supple. No thyromegaly present.  Cardiovascular: Normal rate, regular rhythm, normal heart sounds and intact distal pulses.   No murmur heard. Pulmonary/Chest: Effort normal and breath sounds normal. No respiratory distress.  Abdominal: Soft. Bowel sounds are normal. Ryan Zhang exhibits no distension.  Musculoskeletal: Ryan Zhang exhibits no edema.  Lymphadenopathy:    Ryan Zhang has no cervical adenopathy.  Neurological: Ryan Zhang is alert and oriented to person, place, and time. No cranial nerve deficit.  Skin: Skin is warm and dry.  Psychiatric: Ryan Zhang has a normal mood and affect. His behavior is normal.          Assessment & Plan:

## 2014-07-09 NOTE — Assessment & Plan Note (Signed)
Chronic problem for pt.  Not currently checking sugars due to increased stress and recurrence of depression.  UTD on eye exam.  On ACE for renal protection.  Currently asymptomatic.  Check labs.  Adjust meds prn

## 2014-07-09 NOTE — Patient Instructions (Signed)
Follow up in 3-4 months to recheck diabetes, BP, cholesterol We'll notify you of your lab results and make any changes if needed I sent the prescription for your trazodone to the pharmacy Please call with any questions or concerns Hang in there!!

## 2014-07-09 NOTE — Progress Notes (Signed)
Pre visit review using our clinic review tool, if applicable. No additional management support is needed unless otherwise documented below in the visit note. 

## 2014-07-30 ENCOUNTER — Emergency Department (HOSPITAL_COMMUNITY)
Admission: EM | Admit: 2014-07-30 | Discharge: 2014-07-31 | Disposition: A | Discharge status: Home / Self Care | Payer: No Typology Code available for payment source | Attending: Emergency Medicine | Admitting: Emergency Medicine

## 2014-07-30 ENCOUNTER — Emergency Department (HOSPITAL_COMMUNITY): Payer: No Typology Code available for payment source

## 2014-07-30 ENCOUNTER — Encounter (HOSPITAL_COMMUNITY): Payer: Self-pay | Admitting: Emergency Medicine

## 2014-07-30 DIAGNOSIS — J45909 Unspecified asthma, uncomplicated: Secondary | ICD-10-CM | POA: Insufficient documentation

## 2014-07-30 DIAGNOSIS — Z791 Long term (current) use of non-steroidal anti-inflammatories (NSAID): Secondary | ICD-10-CM | POA: Diagnosis not present

## 2014-07-30 DIAGNOSIS — Z860100 Personal history of colon polyps, unspecified: Secondary | ICD-10-CM | POA: Insufficient documentation

## 2014-07-30 DIAGNOSIS — Z8669 Personal history of other diseases of the nervous system and sense organs: Secondary | ICD-10-CM | POA: Diagnosis not present

## 2014-07-30 DIAGNOSIS — I10 Essential (primary) hypertension: Secondary | ICD-10-CM | POA: Insufficient documentation

## 2014-07-30 DIAGNOSIS — Z79899 Other long term (current) drug therapy: Secondary | ICD-10-CM | POA: Insufficient documentation

## 2014-07-30 DIAGNOSIS — Z7902 Long term (current) use of antithrombotics/antiplatelets: Secondary | ICD-10-CM | POA: Insufficient documentation

## 2014-07-30 DIAGNOSIS — Z8619 Personal history of other infectious and parasitic diseases: Secondary | ICD-10-CM | POA: Insufficient documentation

## 2014-07-30 DIAGNOSIS — Y92410 Unspecified street and highway as the place of occurrence of the external cause: Secondary | ICD-10-CM | POA: Diagnosis not present

## 2014-07-30 DIAGNOSIS — E119 Type 2 diabetes mellitus without complications: Secondary | ICD-10-CM | POA: Diagnosis not present

## 2014-07-30 DIAGNOSIS — IMO0002 Reserved for concepts with insufficient information to code with codable children: Secondary | ICD-10-CM | POA: Insufficient documentation

## 2014-07-30 DIAGNOSIS — Z87442 Personal history of urinary calculi: Secondary | ICD-10-CM | POA: Diagnosis not present

## 2014-07-30 DIAGNOSIS — E785 Hyperlipidemia, unspecified: Secondary | ICD-10-CM | POA: Insufficient documentation

## 2014-07-30 DIAGNOSIS — S7001XA Contusion of right hip, initial encounter: Secondary | ICD-10-CM

## 2014-07-30 DIAGNOSIS — Z87891 Personal history of nicotine dependence: Secondary | ICD-10-CM | POA: Diagnosis not present

## 2014-07-30 DIAGNOSIS — F319 Bipolar disorder, unspecified: Secondary | ICD-10-CM | POA: Insufficient documentation

## 2014-07-30 DIAGNOSIS — K219 Gastro-esophageal reflux disease without esophagitis: Secondary | ICD-10-CM | POA: Insufficient documentation

## 2014-07-30 DIAGNOSIS — Z8673 Personal history of transient ischemic attack (TIA), and cerebral infarction without residual deficits: Secondary | ICD-10-CM | POA: Diagnosis not present

## 2014-07-30 DIAGNOSIS — M1990 Unspecified osteoarthritis, unspecified site: Secondary | ICD-10-CM | POA: Diagnosis not present

## 2014-07-30 DIAGNOSIS — S7000XA Contusion of unspecified hip, initial encounter: Secondary | ICD-10-CM | POA: Diagnosis not present

## 2014-07-30 DIAGNOSIS — Y9389 Activity, other specified: Secondary | ICD-10-CM | POA: Diagnosis not present

## 2014-07-30 DIAGNOSIS — E669 Obesity, unspecified: Secondary | ICD-10-CM | POA: Insufficient documentation

## 2014-07-30 LAB — I-STAT CHEM 8, ED
BUN: 23 mg/dL (ref 6–23)
Calcium, Ion: 1.11 mmol/L — ABNORMAL LOW (ref 1.13–1.30)
Chloride: 102 mEq/L (ref 96–112)
Creatinine, Ser: 1 mg/dL (ref 0.50–1.35)
Glucose, Bld: 115 mg/dL — ABNORMAL HIGH (ref 70–99)
HCT: 43 % (ref 39.0–52.0)
Hemoglobin: 14.6 g/dL (ref 13.0–17.0)
Potassium: 4.5 mEq/L (ref 3.7–5.3)
Sodium: 142 mEq/L (ref 137–147)
TCO2: 29 mmol/L (ref 0–100)

## 2014-07-30 MED ORDER — IOHEXOL 300 MG/ML  SOLN
100.0000 mL | Freq: Once | INTRAMUSCULAR | Status: AC | PRN
  Administered 2014-07-30: 80 mL via INTRAVENOUS

## 2014-07-30 MED ORDER — HYDROMORPHONE HCL PF 1 MG/ML IJ SOLN
1.0000 mg | Freq: Once | INTRAMUSCULAR | Status: AC
Start: 2014-07-30 — End: 2014-07-30
  Administered 2014-07-30: 1 mg via INTRAVENOUS
  Filled 2014-07-30: qty 1

## 2014-07-30 NOTE — ED Notes (Signed)
Patient transported to X-ray 

## 2014-07-30 NOTE — ED Notes (Signed)
Patient transported to CT 
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
## 2014-07-31 MED ORDER — HYDROCODONE-ACETAMINOPHEN 5-325 MG PO TABS: 1.0000 | ORAL_TABLET | ORAL | Status: DC | PRN
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
## 2014-08-01 ENCOUNTER — Ambulatory Visit: Payer: Medicare Other | Admitting: Medical

## 2014-08-01 ENCOUNTER — Telehealth: Payer: Self-pay | Admitting: *Deleted

## 2014-08-01 DIAGNOSIS — Z0289 Encounter for other administrative examinations: Secondary | ICD-10-CM

## 2014-08-01 NOTE — Telephone Encounter (Signed)
Pt did not show for appointment 08/01/2014 at 1pm for ED follow up
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
## 2014-08-05 ENCOUNTER — Ambulatory Visit (INDEPENDENT_AMBULATORY_CARE_PROVIDER_SITE_OTHER): Payer: Medicare Other | Admitting: Family Medicine

## 2014-08-05 ENCOUNTER — Encounter: Payer: Self-pay | Admitting: Family Medicine

## 2014-08-05 VITALS — BP 122/80 | HR 74 | Temp 98.0°F | Resp 16 | Wt 243.1 lb

## 2014-08-05 DIAGNOSIS — M545 Low back pain, unspecified: Secondary | ICD-10-CM

## 2014-08-05 DIAGNOSIS — H9191 Unspecified hearing loss, right ear: Secondary | ICD-10-CM | POA: Insufficient documentation

## 2014-08-05 DIAGNOSIS — H9190 Unspecified hearing loss, unspecified ear: Secondary | ICD-10-CM

## 2014-08-05 MED ORDER — MELOXICAM 15 MG PO TABS: 15.0000 mg | ORAL_TABLET | Freq: Every day | ORAL | Status: DC

## 2014-08-05 NOTE — Telephone Encounter (Signed)
Patient states he did not know he missed appointment. States he is doing better and has appointment today with his PCP.
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
## 2014-08-05 NOTE — Patient Instructions (Signed)
Follow up as needed If no improvement in hearing (to your satisfaction) by Friday, please call and we'll refer you to [facility] for complete evaluation Start the Mobic daily w/ food x7 days for pain/inflammation HEAT! Call with any questions or concerns Hang in there!

## 2014-08-05 NOTE — Progress Notes (Signed)
Pre visit review using our clinic review tool, if applicable. No additional management support is needed unless otherwise documented below in the visit note. 
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
## 2014-08-09 ENCOUNTER — Telehealth: Payer: Self-pay | Admitting: Family Medicine

## 2014-08-09 DIAGNOSIS — H9190 Unspecified hearing loss, unspecified ear: Secondary | ICD-10-CM

## 2014-08-09 NOTE — Telephone Encounter (Signed)
Caller name:Alessio. Affan Relation to AV:WUJW  Call back number:[phone] Pharmacy:  Reason for call: pt is needing a referral to Dr. Memory Argue, pt has aarp medicare complete, stated dr. Beverely Low informed him to call back if he needed the referral.

## 2014-08-09 NOTE — Telephone Encounter (Signed)
Referral placed.

## 2014-08-09 NOTE — Telephone Encounter (Signed)
Dr Memory Argue is audiologist at [facility].  Ok for referral due to hearing loss

## 2014-08-27 ENCOUNTER — Other Ambulatory Visit: Payer: Self-pay

## 2014-08-30 ENCOUNTER — Encounter: Payer: Self-pay | Admitting: Family Medicine

## 2014-08-30 DIAGNOSIS — H9190 Unspecified hearing loss, unspecified ear: Secondary | ICD-10-CM

## 2014-08-30 NOTE — Telephone Encounter (Signed)
Referral placed.

## 2014-09-09 ENCOUNTER — Other Ambulatory Visit: Payer: Self-pay | Admitting: Family Medicine

## 2014-09-09 NOTE — Telephone Encounter (Signed)
Med filled.  

## 2014-09-19 ENCOUNTER — Encounter: Payer: Self-pay | Admitting: Family Medicine

## 2014-09-19 DIAGNOSIS — F319 Bipolar disorder, unspecified: Secondary | ICD-10-CM

## 2014-09-19 NOTE — Telephone Encounter (Signed)
Referral placed.

## 2014-10-14 ENCOUNTER — Ambulatory Visit (INDEPENDENT_AMBULATORY_CARE_PROVIDER_SITE_OTHER): Payer: Medicare Other | Admitting: Family Medicine

## 2014-10-14 ENCOUNTER — Encounter: Payer: Self-pay | Admitting: Family Medicine

## 2014-10-14 ENCOUNTER — Ambulatory Visit (INDEPENDENT_AMBULATORY_CARE_PROVIDER_SITE_OTHER): Payer: Medicare Other | Admitting: General Practice

## 2014-10-14 VITALS — BP 122/68 | HR 84 | Temp 97.9°F | Resp 16 | Wt 244.1 lb

## 2014-10-14 DIAGNOSIS — G629 Polyneuropathy, unspecified: Secondary | ICD-10-CM

## 2014-10-14 DIAGNOSIS — I10 Essential (primary) hypertension: Secondary | ICD-10-CM

## 2014-10-14 DIAGNOSIS — Z23 Encounter for immunization: Secondary | ICD-10-CM

## 2014-10-14 DIAGNOSIS — E785 Hyperlipidemia, unspecified: Secondary | ICD-10-CM

## 2014-10-14 DIAGNOSIS — E1142 Type 2 diabetes mellitus with diabetic polyneuropathy: Secondary | ICD-10-CM

## 2014-10-14 DIAGNOSIS — E1169 Type 2 diabetes mellitus with other specified complication: Secondary | ICD-10-CM

## 2014-10-14 LAB — BASIC METABOLIC PANEL
BUN: 25 mg/dL — ABNORMAL HIGH (ref 6–23)
CO2: 25 mEq/L (ref 19–32)
Calcium: 8.8 mg/dL (ref 8.4–10.5)
Chloride: 102 mEq/L (ref 96–112)
Creatinine, Ser: 1 mg/dL (ref 0.4–1.5)
GFR: 80.3 mL/min (ref >60.00)
Glucose, Bld: 161 mg/dL — ABNORMAL HIGH (ref 70–99)
Potassium: 4.5 mEq/L (ref 3.5–5.1)
Sodium: 135 mEq/L (ref 135–145)

## 2014-10-14 LAB — CBC WITH DIFFERENTIAL/PLATELET
Basophils Absolute: 0 10*3/uL (ref 0.0–0.1)
Basophils Relative: 0.3 % (ref 0.0–3.0)
Eosinophils Absolute: 0.2 10*3/uL (ref 0.0–0.7)
Eosinophils Relative: 3.8 % (ref 0.0–5.0)
HCT: 38.6 % — ABNORMAL LOW (ref 39.0–52.0)
Hemoglobin: 12.4 g/dL — ABNORMAL LOW (ref 13.0–17.0)
Lymphocytes Relative: 29.8 % (ref 12.0–46.0)
Lymphs Abs: 1.9 10*3/uL (ref 0.7–4.0)
MCHC: 32.2 g/dL (ref 30.0–36.0)
MCV: 85.3 fl (ref 78.0–100.0)
Monocytes Absolute: 0.5 10*3/uL (ref 0.1–1.0)
Monocytes Relative: 7.7 % (ref 3.0–12.0)
Neutro Abs: 3.7 10*3/uL (ref 1.4–7.7)
Neutrophils Relative %: 58.4 % (ref 43.0–77.0)
Platelets: 272 10*3/uL (ref 150.0–400.0)
RBC: 4.53 Mil/uL (ref 4.22–5.81)
RDW: 15.1 % (ref 11.5–15.5)
WBC: 6.3 10*3/uL (ref 4.0–10.5)

## 2014-10-14 LAB — HEPATIC FUNCTION PANEL
ALT: 13 U/L (ref 0–53)
AST: 18 U/L (ref 0–37)
Albumin: 3.9 g/dL (ref 3.5–5.2)
Alkaline Phosphatase: 74 U/L (ref 39–117)
Bilirubin, Direct: 0 mg/dL (ref 0.0–0.3)
Total Bilirubin: 0.8 mg/dL (ref 0.2–1.2)
Total Protein: 6.8 g/dL (ref 6.0–8.3)

## 2014-10-14 LAB — LIPID PANEL
Cholesterol: 128 mg/dL (ref 0–200)
HDL: 41.8 mg/dL (ref >39.00)
LDL Cholesterol: 70 mg/dL (ref 0–99)
NonHDL: 86.2
Total CHOL/HDL Ratio: 3
Triglycerides: 81 mg/dL (ref 0.0–149.0)
VLDL: 16.2 mg/dL (ref 0.0–40.0)

## 2014-10-14 LAB — HEMOGLOBIN A1C: Hgb A1c MFr Bld: 6.5 % (ref 4.6–6.5)

## 2014-10-14 LAB — TSH: TSH: 1.52 u[IU]/mL (ref 0.35–4.50)

## 2014-10-14 NOTE — Assessment & Plan Note (Signed)
Chronic problem.  UTD on eye exam.  On ACE for renal protection.  Not checking CBGs regularly.  Currently asymptomatic w/ exception of chronic neuropathy- unchanged from baseline.  Check labs.  Adjust meds prn

## 2014-10-14 NOTE — Patient Instructions (Signed)
Follow up in 3-4 months to recheck diabetes We'll notify you of your lab results and make any changes if needed Keep up the good work on healthy diet Increase your water and potassium intake (citrus fruits, leafy greens, bananas) to improve leg cramps.  Also try tonic water before bed Call with any questions or concerns Hang in there! Happy Holidays!!!

## 2014-10-14 NOTE — Progress Notes (Signed)
   Subjective:    Patient ID: Ryan Zhang, male    DOB: [birth year], 68 y.o.   MRN: [id number]  HPI DM- chronic problem, on Actos/Metformin.  On ACE for renal protection.  UTD on eye exam (July 2015).  Denies symptomatic lows.  Chronic neuropathy- denies sores or wounds on feet.  HTN- chronic problem, on Enalapril.  BP is well controlled today.  Denies CP, SOB, HAs, visual changes, edema.  Hyperlipidemia- chronic, on Simvastatin.  Denies abd pain, N/V, myalgias   Review of Systems For ROS see HPI     Objective:   Physical Exam  Constitutional: He is oriented to person, place, and time. He appears well-developed and well-nourished. No distress.  HENT:  Head: Normocephalic and atraumatic.  Eyes: Conjunctivae and EOM are normal. Pupils are equal, round, and reactive to light.  Neck: Normal range of motion. Neck supple. No thyromegaly present.  Cardiovascular: Normal rate, regular rhythm, normal heart sounds and intact distal pulses.   No murmur heard. Pulmonary/Chest: Effort normal and breath sounds normal. No respiratory distress.  Abdominal: Soft. Bowel sounds are normal. He exhibits no distension.  Musculoskeletal: He exhibits no edema.  Lymphadenopathy:    He has no cervical adenopathy.  Neurological: He is alert and oriented to person, place, and time. No cranial nerve deficit.  Skin: Skin is warm and dry.  Psychiatric: He has a normal mood and affect. His behavior is normal.  Vitals reviewed.         Assessment & Plan:

## 2014-10-14 NOTE — Assessment & Plan Note (Signed)
Chronic problem.  Well controlled.  Asymptomatic.  Check labs.  No anticipated med changes. 

## 2014-10-14 NOTE — Progress Notes (Signed)
Pre visit review using our clinic review tool, if applicable. No additional management support is needed unless otherwise documented below in the visit note. 

## 2014-10-14 NOTE — Assessment & Plan Note (Signed)
Chronic problem.  Tolerating statin w/o difficulty.  Check labs.  Adjust meds prn  

## 2014-10-22 ENCOUNTER — Telehealth: Payer: Self-pay | Admitting: Family Medicine

## 2014-10-22 MED ORDER — GLUCOSE BLOOD VI STRP: ORAL_STRIP | Status: DC

## 2014-10-22 NOTE — Telephone Encounter (Signed)
Med filled to [facility]. No pharmacy listed below.

## 2014-10-22 NOTE — Telephone Encounter (Signed)
Pt requesting a refill test strips for he's one touch ultra
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
## 2014-11-20 DIAGNOSIS — J301 Allergic rhinitis due to pollen: Secondary | ICD-10-CM | POA: Diagnosis not present

## 2014-11-20 DIAGNOSIS — J3089 Other allergic rhinitis: Secondary | ICD-10-CM | POA: Diagnosis not present

## 2014-11-21 DIAGNOSIS — Z974 Presence of external hearing-aid: Secondary | ICD-10-CM | POA: Diagnosis not present

## 2014-11-21 DIAGNOSIS — H903 Sensorineural hearing loss, bilateral: Secondary | ICD-10-CM | POA: Diagnosis not present

## 2014-11-21 DIAGNOSIS — H905 Unspecified sensorineural hearing loss: Secondary | ICD-10-CM | POA: Diagnosis not present

## 2014-11-27 DIAGNOSIS — J3089 Other allergic rhinitis: Secondary | ICD-10-CM | POA: Diagnosis not present

## 2014-11-27 DIAGNOSIS — J301 Allergic rhinitis due to pollen: Secondary | ICD-10-CM | POA: Diagnosis not present

## 2014-12-04 DIAGNOSIS — J3089 Other allergic rhinitis: Secondary | ICD-10-CM | POA: Diagnosis not present

## 2014-12-04 DIAGNOSIS — J301 Allergic rhinitis due to pollen: Secondary | ICD-10-CM | POA: Diagnosis not present

## 2014-12-11 DIAGNOSIS — J301 Allergic rhinitis due to pollen: Secondary | ICD-10-CM | POA: Diagnosis not present

## 2014-12-11 DIAGNOSIS — J3089 Other allergic rhinitis: Secondary | ICD-10-CM | POA: Diagnosis not present

## 2014-12-13 DIAGNOSIS — N480 Leukoplakia of penis: Secondary | ICD-10-CM | POA: Diagnosis not present

## 2014-12-13 DIAGNOSIS — N359 Urethral stricture, unspecified: Secondary | ICD-10-CM | POA: Diagnosis not present

## 2014-12-13 DIAGNOSIS — N489 Disorder of penis, unspecified: Secondary | ICD-10-CM | POA: Diagnosis not present

## 2014-12-18 DIAGNOSIS — J3089 Other allergic rhinitis: Secondary | ICD-10-CM | POA: Diagnosis not present

## 2014-12-25 DIAGNOSIS — J301 Allergic rhinitis due to pollen: Secondary | ICD-10-CM | POA: Diagnosis not present

## 2014-12-25 DIAGNOSIS — J3089 Other allergic rhinitis: Secondary | ICD-10-CM | POA: Diagnosis not present

## 2014-12-27 DIAGNOSIS — J3089 Other allergic rhinitis: Secondary | ICD-10-CM | POA: Diagnosis not present

## 2014-12-27 DIAGNOSIS — J301 Allergic rhinitis due to pollen: Secondary | ICD-10-CM | POA: Diagnosis not present

## 2015-01-01 DIAGNOSIS — J301 Allergic rhinitis due to pollen: Secondary | ICD-10-CM | POA: Diagnosis not present

## 2015-01-01 DIAGNOSIS — J3081 Allergic rhinitis due to animal (cat) (dog) hair and dander: Secondary | ICD-10-CM | POA: Diagnosis not present

## 2015-01-02 DIAGNOSIS — N489 Disorder of penis, unspecified: Secondary | ICD-10-CM | POA: Diagnosis not present

## 2015-01-03 DIAGNOSIS — J301 Allergic rhinitis due to pollen: Secondary | ICD-10-CM | POA: Diagnosis not present

## 2015-01-03 DIAGNOSIS — J4540 Moderate persistent asthma, uncomplicated: Secondary | ICD-10-CM | POA: Diagnosis not present

## 2015-01-03 DIAGNOSIS — J3089 Other allergic rhinitis: Secondary | ICD-10-CM | POA: Diagnosis not present

## 2015-01-08 DIAGNOSIS — J3089 Other allergic rhinitis: Secondary | ICD-10-CM | POA: Diagnosis not present

## 2015-01-08 DIAGNOSIS — J301 Allergic rhinitis due to pollen: Secondary | ICD-10-CM | POA: Diagnosis not present

## 2015-01-10 ENCOUNTER — Other Ambulatory Visit: Payer: Self-pay | Admitting: Family Medicine

## 2015-01-10 ENCOUNTER — Encounter: Payer: Self-pay | Admitting: Family Medicine

## 2015-01-10 NOTE — Telephone Encounter (Signed)
Med filled.  

## 2015-01-13 ENCOUNTER — Other Ambulatory Visit: Payer: Self-pay | Admitting: Family Medicine

## 2015-01-13 NOTE — Telephone Encounter (Signed)
Med filled.  

## 2015-01-15 DIAGNOSIS — J3089 Other allergic rhinitis: Secondary | ICD-10-CM | POA: Diagnosis not present

## 2015-01-15 DIAGNOSIS — J301 Allergic rhinitis due to pollen: Secondary | ICD-10-CM | POA: Diagnosis not present

## 2015-01-22 DIAGNOSIS — J3089 Other allergic rhinitis: Secondary | ICD-10-CM | POA: Diagnosis not present

## 2015-01-22 DIAGNOSIS — J301 Allergic rhinitis due to pollen: Secondary | ICD-10-CM | POA: Diagnosis not present

## 2015-01-29 DIAGNOSIS — J301 Allergic rhinitis due to pollen: Secondary | ICD-10-CM | POA: Diagnosis not present

## 2015-01-29 DIAGNOSIS — J3089 Other allergic rhinitis: Secondary | ICD-10-CM | POA: Diagnosis not present

## 2015-02-04 DIAGNOSIS — J3089 Other allergic rhinitis: Secondary | ICD-10-CM | POA: Diagnosis not present

## 2015-02-04 DIAGNOSIS — J301 Allergic rhinitis due to pollen: Secondary | ICD-10-CM | POA: Diagnosis not present

## 2015-02-11 ENCOUNTER — Other Ambulatory Visit: Payer: Self-pay | Admitting: Family Medicine

## 2015-02-11 NOTE — Telephone Encounter (Signed)
Med filled.  

## 2015-02-12 ENCOUNTER — Ambulatory Visit (INDEPENDENT_AMBULATORY_CARE_PROVIDER_SITE_OTHER): Payer: Medicare Other | Admitting: Family Medicine

## 2015-02-12 ENCOUNTER — Encounter: Payer: Self-pay | Admitting: Family Medicine

## 2015-02-12 VITALS — BP 130/78 | HR 89 | Temp 97.9°F | Resp 16 | Wt 238.0 lb

## 2015-02-12 DIAGNOSIS — Z23 Encounter for immunization: Secondary | ICD-10-CM

## 2015-02-12 DIAGNOSIS — E1142 Type 2 diabetes mellitus with diabetic polyneuropathy: Secondary | ICD-10-CM | POA: Diagnosis not present

## 2015-02-12 DIAGNOSIS — G629 Polyneuropathy, unspecified: Secondary | ICD-10-CM | POA: Diagnosis not present

## 2015-02-12 LAB — BASIC METABOLIC PANEL
BUN: 23 mg/dL (ref 6–23)
CO2: 27 mEq/L (ref 19–32)
Calcium: 9.4 mg/dL (ref 8.4–10.5)
Chloride: 104 mEq/L (ref 96–112)
Creatinine, Ser: 0.96 mg/dL (ref 0.40–1.50)
GFR: 83.11 mL/min (ref >60.00)
Glucose, Bld: 111 mg/dL — ABNORMAL HIGH (ref 70–99)
Potassium: 4.4 mEq/L (ref 3.5–5.1)
Sodium: 138 mEq/L (ref 135–145)

## 2015-02-12 LAB — HEMOGLOBIN A1C: Hgb A1c MFr Bld: 6.4 % (ref 4.6–6.5)

## 2015-02-12 NOTE — Assessment & Plan Note (Signed)
Chronic problem.  Pt reports he is UTD on eye exam.  Chronic neuropathy.  Despite considerable stress, he reports CBGs are adequately controlled when he spot checks them.  On ACE for renal protection.  Check labs.  Adjust meds prn

## 2015-02-12 NOTE — Patient Instructions (Signed)
Schedule your complete physical in 3-4 months We'll notify you of your lab results and make any changes if needed Try and make healthy food choices and get regular exercise Call with any questions or concerns Hang in there!!

## 2015-02-12 NOTE — Progress Notes (Signed)
Pre visit review using our clinic review tool, if applicable. No additional management support is needed unless otherwise documented below in the visit note. 

## 2015-02-12 NOTE — Progress Notes (Signed)
   Subjective:    Patient ID: Ryan Zhang, male    DOB: 1946-11-10, 69 y.o.   MRN: [id number]  HPI DM- chronic problem, on ActoplusMet.  UTD on eye exam but pt unable to give date of appt.  On ACE for renal protection.  UTD on foot exam.  Pt reports CBGs are 'ok' despite high levels of stress.  Denies symptomatic lows.  No CP, SOB, HAs, visual changes.  Chronic neuropathy- unchanged.   Review of Systems For ROS see HPI     Objective:   Physical Exam  Constitutional: He is oriented to person, place, and time. He appears well-developed and well-nourished. No distress.  Obese, disheveled.  HENT:  Head: Normocephalic and atraumatic.  Eyes: Conjunctivae and EOM are normal. Pupils are equal, round, and reactive to light.  Neck: Normal range of motion. Neck supple. No thyromegaly present.  Cardiovascular: Normal rate, regular rhythm, normal heart sounds and intact distal pulses.   No murmur heard. Pulmonary/Chest: Effort normal and breath sounds normal. No respiratory distress.  Abdominal: Soft. Bowel sounds are normal. He exhibits no distension.  Musculoskeletal: He exhibits no edema.  Lymphadenopathy:    He has no cervical adenopathy.  Neurological: He is alert and oriented to person, place, and time. No cranial nerve deficit.  Skin: Skin is warm and dry.  Psychiatric: He has a normal mood and affect. His behavior is normal.  Vitals reviewed.         Assessment & Plan:

## 2015-02-13 DIAGNOSIS — J301 Allergic rhinitis due to pollen: Secondary | ICD-10-CM | POA: Diagnosis not present

## 2015-02-18 ENCOUNTER — Encounter: Payer: Self-pay | Admitting: Family Medicine

## 2015-02-19 DIAGNOSIS — J3089 Other allergic rhinitis: Secondary | ICD-10-CM | POA: Diagnosis not present

## 2015-02-19 DIAGNOSIS — J301 Allergic rhinitis due to pollen: Secondary | ICD-10-CM | POA: Diagnosis not present

## 2015-02-19 MED ORDER — CLOPIDOGREL BISULFATE 75 MG PO TABS
75.0000 mg | ORAL_TABLET | Freq: Every day | ORAL | Status: DC
Start: 2015-02-19 — End: 2015-05-20

## 2015-02-19 MED ORDER — SIMVASTATIN 40 MG PO TABS: ORAL_TABLET | ORAL | Status: DC

## 2015-02-19 MED ORDER — PIOGLITAZONE HCL-METFORMIN HCL 15-850 MG PO TABS: ORAL_TABLET | ORAL | Status: DC

## 2015-02-19 MED ORDER — ENALAPRIL MALEATE 2.5 MG PO TABS: 2.5000 mg | ORAL_TABLET | Freq: Every day | ORAL | Status: DC

## 2015-02-19 NOTE — Telephone Encounter (Signed)
meds filled

## 2015-02-27 DIAGNOSIS — J3089 Other allergic rhinitis: Secondary | ICD-10-CM | POA: Diagnosis not present

## 2015-02-27 DIAGNOSIS — J301 Allergic rhinitis due to pollen: Secondary | ICD-10-CM | POA: Diagnosis not present

## 2015-03-07 DIAGNOSIS — J301 Allergic rhinitis due to pollen: Secondary | ICD-10-CM | POA: Diagnosis not present

## 2015-03-07 DIAGNOSIS — J3089 Other allergic rhinitis: Secondary | ICD-10-CM | POA: Diagnosis not present

## 2015-03-12 DIAGNOSIS — J301 Allergic rhinitis due to pollen: Secondary | ICD-10-CM | POA: Diagnosis not present

## 2015-03-12 DIAGNOSIS — J3089 Other allergic rhinitis: Secondary | ICD-10-CM | POA: Diagnosis not present

## 2015-03-15 ENCOUNTER — Emergency Department (HOSPITAL_BASED_OUTPATIENT_CLINIC_OR_DEPARTMENT_OTHER)
Admission: EM | Admit: 2015-03-15 | Discharge: 2015-03-15 | Disposition: A | Discharge status: Home / Self Care | Payer: Medicare Other | Attending: Emergency Medicine | Admitting: Emergency Medicine

## 2015-03-15 ENCOUNTER — Encounter (HOSPITAL_BASED_OUTPATIENT_CLINIC_OR_DEPARTMENT_OTHER): Payer: Self-pay | Admitting: Emergency Medicine

## 2015-03-15 DIAGNOSIS — E785 Hyperlipidemia, unspecified: Secondary | ICD-10-CM | POA: Diagnosis not present

## 2015-03-15 DIAGNOSIS — Z7951 Long term (current) use of inhaled steroids: Secondary | ICD-10-CM | POA: Insufficient documentation

## 2015-03-15 DIAGNOSIS — L03113 Cellulitis of right upper limb: Secondary | ICD-10-CM | POA: Insufficient documentation

## 2015-03-15 DIAGNOSIS — Z87891 Personal history of nicotine dependence: Secondary | ICD-10-CM | POA: Diagnosis not present

## 2015-03-15 DIAGNOSIS — Z791 Long term (current) use of non-steroidal anti-inflammatories (NSAID): Secondary | ICD-10-CM | POA: Diagnosis not present

## 2015-03-15 DIAGNOSIS — I891 Lymphangitis: Secondary | ICD-10-CM

## 2015-03-15 DIAGNOSIS — J45909 Unspecified asthma, uncomplicated: Secondary | ICD-10-CM | POA: Insufficient documentation

## 2015-03-15 DIAGNOSIS — Z8669 Personal history of other diseases of the nervous system and sense organs: Secondary | ICD-10-CM | POA: Insufficient documentation

## 2015-03-15 DIAGNOSIS — Z8619 Personal history of other infectious and parasitic diseases: Secondary | ICD-10-CM | POA: Diagnosis not present

## 2015-03-15 DIAGNOSIS — Z79899 Other long term (current) drug therapy: Secondary | ICD-10-CM | POA: Insufficient documentation

## 2015-03-15 DIAGNOSIS — Z7902 Long term (current) use of antithrombotics/antiplatelets: Secondary | ICD-10-CM | POA: Insufficient documentation

## 2015-03-15 DIAGNOSIS — Z86010 Personal history of colonic polyps: Secondary | ICD-10-CM | POA: Diagnosis not present

## 2015-03-15 DIAGNOSIS — E669 Obesity, unspecified: Secondary | ICD-10-CM | POA: Insufficient documentation

## 2015-03-15 DIAGNOSIS — Z8673 Personal history of transient ischemic attack (TIA), and cerebral infarction without residual deficits: Secondary | ICD-10-CM | POA: Insufficient documentation

## 2015-03-15 DIAGNOSIS — E119 Type 2 diabetes mellitus without complications: Secondary | ICD-10-CM | POA: Diagnosis not present

## 2015-03-15 DIAGNOSIS — I10 Essential (primary) hypertension: Secondary | ICD-10-CM | POA: Diagnosis not present

## 2015-03-15 DIAGNOSIS — Z87442 Personal history of urinary calculi: Secondary | ICD-10-CM | POA: Diagnosis not present

## 2015-03-15 DIAGNOSIS — K219 Gastro-esophageal reflux disease without esophagitis: Secondary | ICD-10-CM | POA: Insufficient documentation

## 2015-03-15 DIAGNOSIS — F319 Bipolar disorder, unspecified: Secondary | ICD-10-CM | POA: Insufficient documentation

## 2015-03-15 DIAGNOSIS — L089 Local infection of the skin and subcutaneous tissue, unspecified: Secondary | ICD-10-CM | POA: Diagnosis present

## 2015-03-15 LAB — CBC WITH DIFFERENTIAL/PLATELET
Basophils Absolute: 0 10*3/uL (ref 0.0–0.1)
Basophils Relative: 0 % (ref 0–1)
Eosinophils Absolute: 0.2 10*3/uL (ref 0.0–0.7)
Eosinophils Relative: 3 % (ref 0–5)
HCT: 37.7 % — ABNORMAL LOW (ref 39.0–52.0)
Hemoglobin: 12.2 g/dL — ABNORMAL LOW (ref 13.0–17.0)
Lymphocytes Relative: 28 % (ref 12–46)
Lymphs Abs: 2.1 10*3/uL (ref 0.7–4.0)
MCH: 27.5 pg (ref 26.0–34.0)
MCHC: 32.4 g/dL (ref 30.0–36.0)
MCV: 84.9 fL (ref 78.0–100.0)
Monocytes Absolute: 0.7 10*3/uL (ref 0.1–1.0)
Monocytes Relative: 9 % (ref 3–12)
Neutro Abs: 4.6 10*3/uL (ref 1.7–7.7)
Neutrophils Relative %: 60 % (ref 43–77)
Platelets: 214 10*3/uL (ref 150–400)
RBC: 4.44 MIL/uL (ref 4.22–5.81)
RDW: 15.6 % — ABNORMAL HIGH (ref 11.5–15.5)
WBC: 7.7 10*3/uL (ref 4.0–10.5)

## 2015-03-15 LAB — BASIC METABOLIC PANEL
Anion gap: 7 (ref 5–15)
BUN: 19 mg/dL (ref 6–23)
CO2: 25 mmol/L (ref 19–32)
Calcium: 8.6 mg/dL (ref 8.4–10.5)
Chloride: 109 mmol/L (ref 96–112)
Creatinine, Ser: 0.92 mg/dL (ref 0.50–1.35)
GFR calc Af Amer: >90 mL/min (ref >90)
GFR calc non Af Amer: 86 mL/min — ABNORMAL LOW (ref >90)
Glucose, Bld: 122 mg/dL — ABNORMAL HIGH (ref 70–99)
Potassium: 3.7 mmol/L (ref 3.5–5.1)
Sodium: 141 mmol/L (ref 135–145)

## 2015-03-15 MED ORDER — DOXYCYCLINE HYCLATE 100 MG PO TABS
100.0000 mg | ORAL_TABLET | Freq: Once | ORAL | Status: AC
  Administered 2015-03-15: 100 mg via ORAL
  Filled 2015-03-15: qty 1

## 2015-03-15 MED ORDER — DOXYCYCLINE HYCLATE 100 MG PO CAPS: 100.0000 mg | ORAL_CAPSULE | Freq: Two times a day (BID) | ORAL | Status: DC

## 2015-03-15 NOTE — ED Notes (Addendum)
Patient reports that he has had a rash since wed when it started randomly when he thought he was bit. Patient has redness and swelling to his right hand. There is noted red track up his right hand

## 2015-03-15 NOTE — ED Provider Notes (Signed)
CSN: [id number]     Arrival date & time 03/15/15  1803 History   First MD Initiated Contact with Patient 03/15/15 1828     Chief Complaint  Patient presents with  . Recurrent Skin Infections     (Consider location/radiation/quality/duration/timing/severity/associated sxs/prior Treatment) HPI Comments: Patient with history of diabetes on metformin presents with complaint of right hand infection. Patient states that he felt a sharp stinging sensation like a bite 2 nights ago but did not see an insect. He had several bumps on the back of his right hand which she scratched and expressed clear fluid from. Earlier this morning and became more red and swollen and he noted some streaking going up his right wrist. No fever, nausea or vomiting. No other treatments prior to arrival. The onset of this condition was acute. The course is constant. Aggravating factors: none. Alleviating factors: none.    The history is provided by the patient.    Past Medical History  Diagnosis Date  . Diabetes mellitus   . Hyperlipidemia   . Hypertension   . Bipolar affective   . Allergy     allergy shots Dr. Velora Heckler  . DJD (degenerative joint disease)   . Obesity   . Asthma   . GERD (gastroesophageal reflux disease)   . Meniere's disease   . Hollenhorst plaque     right eye  . Personal history of colonic polyps 11/2004    hyperplastic.  [name] Dysphagia   . Kidney stones   . Pancreatitis   . Hepatitis A     as teenager 61's  . Gallstones   . Motility disorder, esophageal   . Stroke     per MRI   Past Surgical History  Procedure Laterality Date  . Cholecystectomy    . Total knee arthroplasty      both knees   Family History  Problem Relation Age of Onset  . Diabetes Father   . Heart disease Maternal Grandmother   . Colon cancer Neg Hx   . Cancer Sister     unknown type   History  Substance Use Topics  . Smoking status: Former Smoker    Quit date: 11/15/1964  . Smokeless tobacco: Never Used    . Alcohol Use: No    Review of Systems  Constitutional: Negative for fever.  HENT: Negative for rhinorrhea and sore throat.   Eyes: Negative for redness.  Respiratory: Negative for cough.   Cardiovascular: Negative for chest pain.  Gastrointestinal: Negative for nausea, vomiting, abdominal pain and diarrhea.  Genitourinary: Negative for dysuria.  Musculoskeletal: Negative for myalgias.  Skin: Positive for color change and wound. Negative for rash.  Neurological: Negative for headaches.      Allergies  Citalopram hydrobromide; Divalproex sodium; Methocarbamol; Other; Oxycodone-acetaminophen; Paroxetine; Percocet; Risperidone; Smz-tmp ds; and Wellbutrin  Home Medications   Prior to Admission medications   Medication Sig Start Date End Date Taking? Authorizing Provider  asenapine (SAPHRIS) 5 MG SUBL Place 5 mg under the tongue at bedtime.     Historical Provider, MD  benztropine (COGENTIN) 2 MG tablet Take 2 mg by mouth every evening.     Historical Provider, MD  budesonide-formoterol (SYMBICORT) 160-4.5 MCG/ACT inhaler Inhale 2 puffs into the lungs 2 (two) times daily.     Historical Provider, MD  clonazePAM (KLONOPIN) 0.5 MG tablet Take 0.5 mg by mouth 3 (three) times daily.     Historical Provider, MD  clopidogrel (PLAVIX) 75 MG tablet Take 1 tablet (75 mg total) by  mouth daily. 02/19/15   Midge Minium, MD  DULoxetine (CYMBALTA) 60 MG capsule Take 60 mg by mouth every morning.     Historical Provider, MD  enalapril (VASOTEC) 2.5 MG tablet Take 1 tablet (2.5 mg total) by mouth daily. 02/19/15   Midge Minium, MD  EPIPEN 2-PAK 0.3 MG/0.3ML SOAJ injection  07/04/14   Historical Provider, MD  esomeprazole (NEXIUM) 40 MG capsule Take 40 mg by mouth every evening.    Historical Provider, MD  fexofenadine (ALLEGRA) 180 MG tablet Take 180 mg by mouth every morning.     Historical Provider, MD  fluticasone (FLONASE) 50 MCG/ACT nasal spray Place 1 spray into both nostrils daily.     Historical Provider, MD  gabapentin (NEURONTIN) 100 MG capsule  01/20/15   Historical Provider, MD  gabapentin (NEURONTIN) 400 MG capsule  02/03/15   Historical Provider, MD  glucose blood (ONE TOUCH ULTRA TEST) test strip Use one strip each time, glucose levels are tested. Pt tests 1-2 times daily. Dx. E11.9 10/22/14   Midge Minium, MD  HYDROcodone-acetaminophen (NORCO/VICODIN) 5-325 MG per tablet Take 1 tablet by mouth every 4 (four) hours as needed for moderate pain or severe pain. 07/31/14   Pattricia Boss, MD  LamoTRIgine (LAMICTAL XR) 50 MG TB24 Take 50 mg by mouth every evening.     Historical Provider, MD  meloxicam (MOBIC) 15 MG tablet Take 1 tablet (15 mg total) by mouth daily. 08/05/14   Midge Minium, MD  Multiple Vitamins-Minerals ([employer] SPECTRAVITE SENIOR) TABS Take 1 tablet by mouth every morning.     Historical Provider, MD  pioglitazone-metformin (ACTOPLUS MET) 15-850 MG per tablet TAKE 1 TABLET BY MOUTH 2 TIMES DAILY WITH MEALS 02/19/15   Midge Minium, MD  simvastatin (ZOCOR) 40 MG tablet TAKE 1 TABLET BY MOUTH AT BEDTIME. 02/19/15   Midge Minium, MD  traZODone (DESYREL) 50 MG tablet Take 150 mg by mouth.  07/09/14   Midge Minium, MD   BP 133/75 mmHg  Pulse 87  Temp(Src) 97.8 F (36.6 C) (Oral)  Resp 18  Ht 5' 10" (1.778 m)  Wt 234 lb (106.142 kg)  BMI 33.58 kg/m2  SpO2 100%   Physical Exam  Constitutional: He appears well-developed and well-nourished.  HENT:  Head: Normocephalic and atraumatic.  Eyes: Conjunctivae are normal. Right eye exhibits no discharge. Left eye exhibits no discharge.  Neck: Normal range of motion. Neck supple.  Cardiovascular: Normal rate, regular rhythm and normal heart sounds.   Pulmonary/Chest: Effort normal and breath sounds normal.  Abdominal: Soft. There is no tenderness.  Neurological: He is alert.  Skin: Skin is warm and dry.  Patient with erythema and generalized mild swelling of the dorsum of the right hand. There is a  small excoriated wound at the base of the ring finger. There is 5 cm of lymphangitis noted on the dorsum of the left wrist and distal forearm. No palpable abscess.  Psychiatric: He has a normal mood and affect.  Nursing note and vitals reviewed.        ED Course  Procedures (including critical care time) Labs Review Labs Reviewed  CBC WITH DIFFERENTIAL/PLATELET - Abnormal; Notable for the following:    Hemoglobin 12.2 (*)    HCT 37.7 (*)    RDW 15.6 (*)    All other components within normal limits  BASIC METABOLIC PANEL - Abnormal; Notable for the following:    Glucose, Bld 122 (*)    GFR calc non  Af Amer 86 (*)    All other components within normal limits    Imaging Review No results found.   EKG Interpretation None      6:38 PM Patient seen and examined. Work-up initiated. Medications ordered.   Vital signs reviewed and are as follows: BP 133/75 mmHg  Pulse 87  Temp(Src) 97.8 F (36.6 C) (Oral)  Resp 18  Ht 5' 10" (1.778 m)  Wt 234 lb (106.142 kg)  BMI 33.58 kg/m2  SpO2 100%  6:42 PM Discussed with Dr. Stark Jock.   8:29 PM patient updated on lab work. Exam is essentially unchanged. Patient is comfortable with following up here in emergency department tomorrow or with his PCP first thing on Monday (2 days from now). Patient's wife is a retired Marine scientist. Patient encouraged to return with worsening symptoms, worsening streaking, fever, or other concerns.  MDM   Final diagnoses:  Cellulitis of right hand  Lymphangitis   Diabetic with right hand cellulitis and mild lymphangitis. No fever. White blood cell count is normal. Patient appears well, nontoxic. Oral antibiotics given in emergency department. Close follow-up indicated, feel patient does not need immediate admission and has reliable follow-up.   Carlisle Cater, PA-C 03/15/15 2031  Veryl Speak, MD 03/15/15 2154

## 2015-03-15 NOTE — Discharge Instructions (Signed)
Please read and follow all provided instructions.  Your diagnoses today include:  1. Cellulitis of right hand   2. Lymphangitis     Tests performed today include:  Vital signs. See below for your results today.   Blood counts and electrolytes - normal  Medications prescribed:   Doxycycline - antibiotic  You have been prescribed an antibiotic medicine: take the entire course of medicine even if you are feeling better. Stopping early can cause the antibiotic not to work.  Take any prescribed medications only as directed.   Home care instructions:  Follow any educational materials contained in this packet. Keep affected area above the level of your heart when possible. Wash area gently twice a day with warm soapy water. Do not apply alcohol or hydrogen peroxide. Cover the area if it draining or weeping.   Follow-up instructions: Return to the Emergency Department in 24 hours for a recheck.  Please follow-up with your primary care provider in the next 2-3 days for further evaluation of your symptoms.   Return instructions:  Return to the Emergency Department if you have:  Fever  Worsening symptoms  Worsening pain  Worsening swelling  Redness of the skin that moves away from the affected area, especially if it streaks away from the affected area   Any other emergent concerns  Your vital signs today were: BP 133/75 mmHg   Pulse 87   Temp(Src) 97.8 F (36.6 C) (Oral)   Resp 18   Ht 5\' 10"  (1.778 m)   Wt 234 lb (106.142 kg)   BMI 33.58 kg/m2   SpO2 100% If your blood pressure (BP) was elevated above 135/85 this visit, please have this repeated by your doctor within one month. --------------

## 2015-03-17 ENCOUNTER — Ambulatory Visit (INDEPENDENT_AMBULATORY_CARE_PROVIDER_SITE_OTHER): Payer: Medicare Other | Admitting: Family Medicine

## 2015-03-17 ENCOUNTER — Encounter: Payer: Self-pay | Admitting: Family Medicine

## 2015-03-17 VITALS — BP 128/84 | HR 80 | Temp 97.9°F | Resp 16 | Wt 248.4 lb

## 2015-03-17 DIAGNOSIS — L03113 Cellulitis of right upper limb: Secondary | ICD-10-CM

## 2015-03-17 NOTE — Patient Instructions (Signed)
Follow up as scheduled Finish the Doxy as directed- take w/ food Use the probiotic to prevent GI upset Peroxide, pat dry and apply neosporin Call with any questions or concerns Hang in there!!!

## 2015-03-17 NOTE — Progress Notes (Signed)
Pre visit review using our clinic review tool, if applicable. No additional management support is needed unless otherwise documented below in the visit note. 

## 2015-03-17 NOTE — Progress Notes (Signed)
   Subjective:    Patient ID: Ryan Zhang, male    DOB: [birth year], 69 y.o.   MRN: [id number]  HPI ER f/u- pt went to ER on 4/30 after noticing that R dorsum of hand was red, swollen and it was streaking up pt's arm.  Pt reports 4 small puncture wounds appeared on Wednesday but he had no problems until Saturday.  Pt was started on Doxy BID.  Pt has 8 days of meds total.  Already streaking has resolved and redness and swelling have improved.   Review of Systems For ROS see HPI     Objective:   Physical Exam  Constitutional: He is oriented to person, place, and time. He appears well-developed and well-nourished. No distress.  Musculoskeletal: He exhibits edema (mild edema of dorsum of R hand).  Neurological: He is alert and oriented to person, place, and time.  Skin: Skin is warm and dry. There is erythema (mild erythema surrounding 4 small puncture wounds on dorsum of R hand).  Psychiatric: He has a normal mood and affect. His behavior is normal. Thought content normal.  Vitals reviewed.         Assessment & Plan:

## 2015-03-17 NOTE — Assessment & Plan Note (Signed)
New.  No current evidence of lymphangitic streaking.  Erythema and edema of R hand is much better than photo from ER note.  Continue doxy as directed.  Reviewed wound care.  Reviewed supportive care and red flags that should prompt return.  Pt expressed understanding and is in agreement w/ plan.

## 2015-03-19 DIAGNOSIS — J301 Allergic rhinitis due to pollen: Secondary | ICD-10-CM | POA: Diagnosis not present

## 2015-03-19 DIAGNOSIS — J3089 Other allergic rhinitis: Secondary | ICD-10-CM | POA: Diagnosis not present

## 2015-04-02 DIAGNOSIS — J3089 Other allergic rhinitis: Secondary | ICD-10-CM | POA: Diagnosis not present

## 2015-04-02 DIAGNOSIS — J301 Allergic rhinitis due to pollen: Secondary | ICD-10-CM | POA: Diagnosis not present

## 2015-04-09 DIAGNOSIS — J301 Allergic rhinitis due to pollen: Secondary | ICD-10-CM | POA: Diagnosis not present

## 2015-04-09 DIAGNOSIS — J3089 Other allergic rhinitis: Secondary | ICD-10-CM | POA: Diagnosis not present

## 2015-04-23 DIAGNOSIS — J3089 Other allergic rhinitis: Secondary | ICD-10-CM | POA: Diagnosis not present

## 2015-04-30 DIAGNOSIS — J301 Allergic rhinitis due to pollen: Secondary | ICD-10-CM | POA: Diagnosis not present

## 2015-04-30 DIAGNOSIS — J3089 Other allergic rhinitis: Secondary | ICD-10-CM | POA: Diagnosis not present

## 2015-05-06 DIAGNOSIS — J301 Allergic rhinitis due to pollen: Secondary | ICD-10-CM | POA: Diagnosis not present

## 2015-05-06 DIAGNOSIS — J3089 Other allergic rhinitis: Secondary | ICD-10-CM | POA: Diagnosis not present

## 2015-05-09 ENCOUNTER — Ambulatory Visit (INDEPENDENT_AMBULATORY_CARE_PROVIDER_SITE_OTHER): Payer: Medicare Other | Admitting: Medical

## 2015-05-09 ENCOUNTER — Encounter: Payer: Self-pay | Admitting: Medical

## 2015-05-09 VITALS — BP 122/63 | HR 91 | Temp 98.4°F | Ht 70.0 in | Wt 249.0 lb

## 2015-05-09 DIAGNOSIS — L853 Xerosis cutis: Secondary | ICD-10-CM | POA: Diagnosis not present

## 2015-05-09 DIAGNOSIS — L739 Follicular disorder, unspecified: Secondary | ICD-10-CM | POA: Insufficient documentation

## 2015-05-09 MED ORDER — DOXYCYCLINE HYCLATE 100 MG PO CAPS: 100.0000 mg | ORAL_CAPSULE | Freq: Two times a day (BID) | ORAL | Status: DC

## 2015-05-09 MED ORDER — AMMONIUM LACTATE 12 % EX CREA: TOPICAL_CREAM | CUTANEOUS | Status: DC | PRN

## 2015-05-09 NOTE — Assessment & Plan Note (Signed)
Mild on both ankes and feet. Use the lac-hydrin since feet/ankles also feel dry.  But watch closely and if not improving go ahead and start doxycycline. I would advise low threshold to start in light of your recent cellulitis hx in May and you diabetes hx.  Rx advisement given for doxycycline.

## 2015-05-09 NOTE — Progress Notes (Signed)
Subjective:    Patient ID: Ryan Zhang, male    DOB: 11-15-1946, 69 y.o.   MRN: [id number]  HPI   Pt has rash on his rt foot and some left foot. He first noticed it yesterday on his rt foot. Then he got rash on his lt foot. Pt thinks maybe early cellulits. He had rt hand infection within the past. Year. He did need antibiotic at that time. Pt state rash on his foot does not itch. Faint sting sensation left medial ankle.   Pt has rt forearm "bumps" as well. These areas don't itch.   Faint rash left upper chest. Mild sting but does not itch.  No tick bit reported.   Pt does admit history of dry skin.  Pt psychiatrist(hx of bipolar) cautioned him not to take any meds such as benadry. He does take allegra. Pt does have allergy hx and is on immunotherapy.   Review of Systems  Constitutional: Negative for fever, chills and fatigue.  Respiratory: Negative for cough, chest tightness and wheezing.   Cardiovascular: Negative for chest pain and palpitations.  Musculoskeletal: Negative for myalgias, joint swelling and neck stiffness.       No new arthralgias.  Skin: Positive for rash. Negative for pallor.       Dry skin on forearms and chest.   Pt does have some apparant follicle looked slight inflamed lower ankles and feet.  Neurological: Negative for dizziness, syncope, weakness, numbness and headaches.  Hematological: Negative for adenopathy. Does not bruise/bleed easily.  Psychiatric/Behavioral: Negative for behavioral problems and confusion.    Past Medical History  Diagnosis Date  . Diabetes mellitus   . Hyperlipidemia   . Hypertension   . Bipolar affective   . Allergy     allergy shots Dr. Velora Heckler  . DJD (degenerative joint disease)   . Obesity   . Asthma   . GERD (gastroesophageal reflux disease)   . Meniere's disease   . Hollenhorst plaque     right eye  . Personal history of colonic polyps 11/2004    hyperplastic.  [name] Dysphagia   . Kidney stones   . Pancreatitis    . Hepatitis A     as teenager 76's  . Gallstones   . Motility disorder, esophageal   . Stroke     per MRI    History   Social History  . Marital Status: Married    Spouse Name: N/A  . Number of Children: 1  . Years of Education: N/A   Occupational History  . disabled     laboratory computing/programming   Social History Main Topics  . Smoking status: Former Smoker    Quit date: 11/15/1964  . Smokeless tobacco: Never Used  . Alcohol Use: No  . Drug Use: No  . Sexual Activity: Not on file   Other Topics Concern  . Not on file   Social History Narrative    Past Surgical History  Procedure Laterality Date  . Cholecystectomy    . Total knee arthroplasty      both knees    Family History  Problem Relation Age of Onset  . Diabetes Father   . Heart disease Maternal Grandmother   . Colon cancer Neg Hx   . Cancer Sister     unknown type    Allergies  Allergen Reactions  . Citalopram Hydrobromide     insomnia  . Divalproex Sodium     Pancreatitis   . Methocarbamol  hallucinations  . Other     "Symbrax" blood sugar  . Oxycodone-Acetaminophen   . Paroxetine     insomnia  . Percocet [Oxycodone-Acetaminophen]     Reversed response  . Risperidone     REACTION: hyper  . Smz-Tmp Ds [Sulfamethoxazole-Trimethoprim] Nausea And Vomiting  . Wellbutrin [Bupropion]     Hot flashes    Current Outpatient Prescriptions on File Prior to Visit  Medication Sig Dispense Refill  . asenapine (SAPHRIS) 5 MG SUBL Place 5 mg under the tongue at bedtime.     . benztropine (COGENTIN) 2 MG tablet Take 2 mg by mouth every evening.     . budesonide-formoterol (SYMBICORT) 160-4.5 MCG/ACT inhaler Inhale 2 puffs into the lungs 2 (two) times daily.     . clonazePAM (KLONOPIN) 0.5 MG tablet Take 0.5 mg by mouth 3 (three) times daily.     . clopidogrel (PLAVIX) 75 MG tablet Take 1 tablet (75 mg total) by mouth daily. 90 tablet 1  . DULoxetine (CYMBALTA) 60 MG capsule Take 60 mg by  mouth every morning.     . enalapril (VASOTEC) 2.5 MG tablet Take 1 tablet (2.5 mg total) by mouth daily. 90 tablet 1  . EPIPEN 2-PAK 0.3 MG/0.3ML SOAJ injection     . esomeprazole (NEXIUM) 40 MG capsule Take 40 mg by mouth every evening.    . fexofenadine (ALLEGRA) 180 MG tablet Take 180 mg by mouth every morning.     . fluticasone (FLONASE) 50 MCG/ACT nasal spray Place 1 spray into both nostrils daily.    [name] gabapentin (NEURONTIN) 100 MG capsule     . gabapentin (NEURONTIN) 400 MG capsule     . glucose blood (ONE TOUCH ULTRA TEST) test strip Use one strip each time, glucose levels are tested. Pt tests 1-2 times daily. Dx. E11.9 100 each 12  . HYDROcodone-acetaminophen (NORCO/VICODIN) 5-325 MG per tablet Take 1 tablet by mouth every 4 (four) hours as needed for moderate pain or severe pain. 10 tablet 0  . LamoTRIgine (LAMICTAL XR) 50 MG TB24 Take 50 mg by mouth every evening.     . meloxicam (MOBIC) 15 MG tablet Take 1 tablet (15 mg total) by mouth daily. 30 tablet 1  . Multiple Vitamin (MULTIVITAMIN) tablet Take 1 tablet by mouth daily. Senior complex    . pioglitazone-metformin (ACTOPLUS MET) 15-850 MG per tablet TAKE 1 TABLET BY MOUTH 2 TIMES DAILY WITH MEALS 180 tablet 1  . simvastatin (ZOCOR) 40 MG tablet TAKE 1 TABLET BY MOUTH AT BEDTIME. 90 tablet 1  . traZODone (DESYREL) 50 MG tablet Take 150 mg by mouth.      No current facility-administered medications on file prior to visit.    BP 122/63 mmHg  Pulse 91  Temp(Src) 98.4 F (36.9 C) (Oral)  Ht _0  (1.778 m)  Wt 249 lb (112.946 kg)  BMI 35.73 kg/m2  SpO2 100%       Objective:   Physical Exam  General- No acute distress. Pleasant patient. Neck- Full range of motion, no jvd Lungs- Clear, even and unlabored. Heart- regular rate and rhythm. Neurologic- CNII- XII grossly intact.  Skin- very dry feel to his forearms. Faint red rash to left upper chest. Both ankles and feet both faint scattered foliclles inflammed. No  redness, no warmth and not tender.      Assessment & Plan:

## 2015-05-09 NOTE — Patient Instructions (Signed)
Dry skin Pt does appear to have dry skin. Apply lac-hydrin to area twice daily.  Folliculitis Mild on both ankes and feet. Use the lac-hydrin since feet/ankles also feel dry.  But watch closely and if not improving go ahead and start doxycycline. I would advise low threshold to start in light of your recent cellulitis hx in May and you diabetes hx.  Rx advisement given for doxycycline.    Follow up in 7 days or as needed.  Continue Allegra.

## 2015-05-09 NOTE — Progress Notes (Signed)
Pre visit review using our clinic review tool, if applicable. No additional management support is needed unless otherwise documented below in the visit note. 

## 2015-05-09 NOTE — Assessment & Plan Note (Signed)
Pt does appear to have dry skin. Apply lac-hydrin to area twice daily.

## 2015-05-11 ENCOUNTER — Other Ambulatory Visit: Payer: Self-pay | Admitting: Family Medicine

## 2015-05-12 NOTE — Telephone Encounter (Signed)
Med filled.  

## 2015-05-14 DIAGNOSIS — J301 Allergic rhinitis due to pollen: Secondary | ICD-10-CM | POA: Diagnosis not present

## 2015-05-14 DIAGNOSIS — J3089 Other allergic rhinitis: Secondary | ICD-10-CM | POA: Diagnosis not present

## 2015-05-20 ENCOUNTER — Other Ambulatory Visit: Payer: Self-pay | Admitting: Family Medicine

## 2015-05-21 DIAGNOSIS — J3089 Other allergic rhinitis: Secondary | ICD-10-CM | POA: Diagnosis not present

## 2015-05-21 DIAGNOSIS — J301 Allergic rhinitis due to pollen: Secondary | ICD-10-CM | POA: Diagnosis not present

## 2015-05-21 NOTE — Telephone Encounter (Signed)
Med filled.  

## 2015-05-22 ENCOUNTER — Ambulatory Visit (INDEPENDENT_AMBULATORY_CARE_PROVIDER_SITE_OTHER): Payer: Medicare Other | Admitting: Family Medicine

## 2015-05-22 ENCOUNTER — Encounter: Payer: Self-pay | Admitting: Family Medicine

## 2015-05-22 ENCOUNTER — Telehealth: Payer: Self-pay | Admitting: Family Medicine

## 2015-05-22 VITALS — BP 124/76 | HR 79 | Temp 98.1°F | Resp 16 | Ht 70.0 in | Wt 250.0 lb

## 2015-05-22 DIAGNOSIS — M7542 Impingement syndrome of left shoulder: Secondary | ICD-10-CM | POA: Diagnosis not present

## 2015-05-22 DIAGNOSIS — E1142 Type 2 diabetes mellitus with diabetic polyneuropathy: Secondary | ICD-10-CM

## 2015-05-22 DIAGNOSIS — E1169 Type 2 diabetes mellitus with other specified complication: Secondary | ICD-10-CM | POA: Diagnosis not present

## 2015-05-22 DIAGNOSIS — G629 Polyneuropathy, unspecified: Secondary | ICD-10-CM | POA: Diagnosis not present

## 2015-05-22 DIAGNOSIS — I10 Essential (primary) hypertension: Secondary | ICD-10-CM

## 2015-05-22 DIAGNOSIS — M19012 Primary osteoarthritis, left shoulder: Secondary | ICD-10-CM | POA: Diagnosis not present

## 2015-05-22 DIAGNOSIS — E785 Hyperlipidemia, unspecified: Secondary | ICD-10-CM | POA: Diagnosis not present

## 2015-05-22 DIAGNOSIS — M79674 Pain in right toe(s): Secondary | ICD-10-CM | POA: Diagnosis not present

## 2015-05-22 LAB — BASIC METABOLIC PANEL
BUN: 28 mg/dL — ABNORMAL HIGH (ref 6–23)
CO2: 29 mEq/L (ref 19–32)
Calcium: 9.5 mg/dL (ref 8.4–10.5)
Chloride: 104 mEq/L (ref 96–112)
Creatinine, Ser: 1.06 mg/dL (ref 0.40–1.50)
GFR: 74.07 mL/min (ref >60.00)
Glucose, Bld: 119 mg/dL — ABNORMAL HIGH (ref 70–99)
Potassium: 4.5 mEq/L (ref 3.5–5.1)
Sodium: 140 mEq/L (ref 135–145)

## 2015-05-22 LAB — LIPID PANEL
Cholesterol: 125 mg/dL (ref 0–200)
HDL: 43.7 mg/dL (ref >39.00)
LDL Cholesterol: 64 mg/dL (ref 0–99)
NonHDL: 81.3
Total CHOL/HDL Ratio: 3
Triglycerides: 88 mg/dL (ref 0.0–149.0)
VLDL: 17.6 mg/dL (ref 0.0–40.0)

## 2015-05-22 LAB — CBC WITH DIFFERENTIAL/PLATELET
Basophils Absolute: 0 10*3/uL (ref 0.0–0.1)
Basophils Relative: 0.7 % (ref 0.0–3.0)
Eosinophils Absolute: 0.3 10*3/uL (ref 0.0–0.7)
Eosinophils Relative: 3.8 % (ref 0.0–5.0)
HCT: 38.5 % — ABNORMAL LOW (ref 39.0–52.0)
Hemoglobin: 12.6 g/dL — ABNORMAL LOW (ref 13.0–17.0)
Lymphocytes Relative: 30.9 % (ref 12.0–46.0)
Lymphs Abs: 2.1 10*3/uL (ref 0.7–4.0)
MCHC: 32.6 g/dL (ref 30.0–36.0)
MCV: 83.1 fl (ref 78.0–100.0)
Monocytes Absolute: 0.4 10*3/uL (ref 0.1–1.0)
Monocytes Relative: 6.5 % (ref 3.0–12.0)
Neutro Abs: 4 10*3/uL (ref 1.4–7.7)
Neutrophils Relative %: 58.1 % (ref 43.0–77.0)
Platelets: 239 10*3/uL (ref 150.0–400.0)
RBC: 4.64 Mil/uL (ref 4.22–5.81)
RDW: 15.5 % (ref 11.5–15.5)
WBC: 6.9 10*3/uL (ref 4.0–10.5)

## 2015-05-22 LAB — HEPATIC FUNCTION PANEL
ALT: 16 U/L (ref 0–53)
AST: 19 U/L (ref 0–37)
Albumin: 3.7 g/dL (ref 3.5–5.2)
Alkaline Phosphatase: 69 U/L (ref 39–117)
Bilirubin, Direct: 0.2 mg/dL (ref 0.0–0.3)
Total Bilirubin: 0.8 mg/dL (ref 0.2–1.2)
Total Protein: 6.5 g/dL (ref 6.0–8.3)

## 2015-05-22 LAB — TSH: TSH: 1.34 u[IU]/mL (ref 0.35–4.50)

## 2015-05-22 LAB — HEMOGLOBIN A1C: Hgb A1c MFr Bld: 6.1 % (ref 4.6–6.5)

## 2015-05-22 NOTE — Progress Notes (Signed)
Pre visit review using our clinic review tool, if applicable. No additional management support is needed unless otherwise documented below in the visit note. 

## 2015-05-22 NOTE — Patient Instructions (Signed)
Schedule your COMPLETE PHYSICAL in 3-4 months We'll notify you of your lab results and make any changes if needed We'll call you with your podiatry appt for the foot pain Call and schedule your eye exam Keep up the good work!  You look great!!! Call with any questions or concerns Happy Early Birthday!!!
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
## 2015-05-22 NOTE — Telephone Encounter (Signed)
As per patient 05/22/15 AVS: Schedule your COMPLETE PHYSICAL in 3-4 months (written on AVS ASAP) . Next available physical 11/04/15 pt placed on high priority waiting list.

## 2015-05-26 DIAGNOSIS — M79674 Pain in right toe(s): Secondary | ICD-10-CM | POA: Diagnosis not present

## 2015-05-26 DIAGNOSIS — E119 Type 2 diabetes mellitus without complications: Secondary | ICD-10-CM | POA: Diagnosis not present

## 2015-05-26 NOTE — Telephone Encounter (Signed)
Scheduled for 05/27/15 9:00am

## 2015-05-27 ENCOUNTER — Encounter: Payer: Self-pay | Admitting: Family Medicine

## 2015-05-27 ENCOUNTER — Ambulatory Visit (INDEPENDENT_AMBULATORY_CARE_PROVIDER_SITE_OTHER): Payer: Medicare Other | Admitting: Family Medicine

## 2015-05-27 VITALS — BP 122/78 | HR 73 | Temp 97.9°F | Resp 16 | Ht 70.0 in | Wt 252.4 lb

## 2015-05-27 DIAGNOSIS — F3131 Bipolar disorder, current episode depressed, mild: Secondary | ICD-10-CM | POA: Diagnosis not present

## 2015-05-27 DIAGNOSIS — Z0000 Encounter for general adult medical examination without abnormal findings: Secondary | ICD-10-CM

## 2015-05-27 NOTE — Assessment & Plan Note (Signed)
Pt's PE WNL and unchanged from previous.  UTD on colonoscopy.  Written screening schedule updated and given to pt.  UTD on vaccines.  Just had labs done last week w/ exception of PSA which will be done at his next visit.  Results reviewed and discussed w/ pt.  Anticipatory guidance provided.

## 2015-05-27 NOTE — Patient Instructions (Signed)
Follow up in 3-4 months to recheck diabetes (we'll check your PSA level at this time) We reviewed your labs from last visit- no changes! You are good on colonoscopy until 2021- yay! Call with any questions or concerns or concerns HAPPY BIRTHDAY!!!

## 2015-05-27 NOTE — Progress Notes (Signed)
Pre visit review using our clinic review tool, if applicable. No additional management support is needed unless otherwise documented below in the visit note. 

## 2015-05-27 NOTE — Progress Notes (Signed)
   Subjective:    Patient ID: Ryan Zhang, male    DOB: 02-19-1946, 69 y.o.   MRN: [id number]  HPI Here today for CPE.  Risk Factors: DM- chronic problem, see note from 05/22/15 Hyperlipidemia- chronic problem, see note from 05/22/15 HTN- chronic problem, see note from 05/22/15  Physical Activity: limited due to multiple orthopedic issues Fall Risk: high due to medication Depression: chronic problem, following w/ psych Hearing: normal to conversational tones, decreased to whispered voice w/ hearing aides.  Decreased in R ear ADL's: independent Cognitive: normal linear thought process, memory and attention intact Home Safety: safe at home, lives w/ wife who is RN Height, Weight, BMI, Visual Acuity: see vitals, vision corrected to 20/20 w/ glasses Counseling: UTD on colonoscopy (2011- due 2021), UTD on vaccines Labs Ordered: See A&P Care Plan: See A&P    Review of Systems Patient reports no vision/hearing changes, anorexia, fever ,adenopathy, persistant/recurrent hoarseness, swallowing issues, chest pain, palpitations, edema, persistant/recurrent cough, hemoptysis, dyspnea (rest,exertional, paroxysmal nocturnal), gastrointestinal  bleeding (melena, rectal bleeding), abdominal pain, excessive heart burn, GU symptoms (dysuria, hematuria, voiding/incontinence issues) syncope, focal weakness, memory loss, skin/hair/nail changes, depression, anxiety, abnormal bruising/bleeding, musculoskeletal symptoms/signs.   + vertigo- pt has had 2 episodes in over 10 yrs but did have one recently    Objective:   Physical Exam BP 122/78 mmHg  Pulse 73  Temp(Src) 97.9 F (36.6 C) (Oral)  Resp 16  Ht 5\' 10"  (1.778 m)  Wt 252 lb 6 oz (114.477 kg)  BMI 36.21 kg/m2  SpO2 95%  General Appearance:    Alert, cooperative, no distress, appears stated age  Head:    Normocephalic, without obvious abnormality, atraumatic  Eyes:    PERRL, conjunctiva/corneas clear, EOM's intact, fundi    benign, both eyes         Ears:    Normal TM's and external ear canals, both ears  Nose:   Nares normal, septum midline, mucosa normal, no drainage   or sinus tenderness  Throat:   Lips, mucosa, and tongue normal; teeth and gums normal  Neck:   Supple, symmetrical, trachea midline, no adenopathy;       thyroid:  No enlargement/tenderness/nodules  Back:     Symmetric, no curvature, ROM normal, no CVA tenderness  Lungs:     Clear to auscultation bilaterally, respirations unlabored  Chest wall:    No tenderness or deformity  Heart:    Regular rate and rhythm, S1 and S2 normal, no murmur, rub   or gallop  Abdomen:     Soft, non-tender, bowel sounds active all four quadrants,    no masses, no organomegaly  Genitalia:    Normal male without lesion, discharge or tenderness  Rectal:    Normal tone, normal prostate, no masses or tenderness  Extremities:   Extremities normal, atraumatic, no cyanosis or edema  Pulses:   2+ and symmetric all extremities  Skin:   Skin color, texture, turgor normal, no rashes or lesions  Lymph nodes:   Cervical, supraclavicular, and axillary nodes normal  Neurologic:   CNII-XII intact. Normal strength, sensation and reflexes      throughout          Assessment & Plan:

## 2015-05-27 NOTE — Assessment & Plan Note (Signed)
Chronic problem.  Following regularly w/ psych.  Doing fairly well on current meds despite drug addicted daughter losing her parental rights to pt's ganddaughters.  Will continue to follow along.

## 2015-05-28 DIAGNOSIS — J301 Allergic rhinitis due to pollen: Secondary | ICD-10-CM | POA: Diagnosis not present

## 2015-05-28 DIAGNOSIS — J3089 Other allergic rhinitis: Secondary | ICD-10-CM | POA: Diagnosis not present

## 2015-05-29 DIAGNOSIS — M19012 Primary osteoarthritis, left shoulder: Secondary | ICD-10-CM | POA: Diagnosis not present

## 2015-06-01 NOTE — Assessment & Plan Note (Signed)
Chronic problem.  Unchanged from previous.  On ace for renal protection.  UTD on eye exam.  Has podiatrist.  Check labs.  Adjust meds prn

## 2015-06-01 NOTE — Assessment & Plan Note (Signed)
New.  No obvious abnormality on PE.  Refer to podiatry due to sharp pain that is different from previous neuropathy.  Pt expressed understanding and is in agreement w/ plan.

## 2015-06-01 NOTE — Assessment & Plan Note (Signed)
Chronic problem.  Well controlled.  Currently asymptomatic.  Check labs.  Adjust meds prn

## 2015-06-01 NOTE — Assessment & Plan Note (Signed)
Chronic problem.  Tolerating statin w/o difficulty.  Check labs.  Adjust meds prn  

## 2015-06-02 DIAGNOSIS — J301 Allergic rhinitis due to pollen: Secondary | ICD-10-CM | POA: Diagnosis not present

## 2015-06-02 DIAGNOSIS — J3089 Other allergic rhinitis: Secondary | ICD-10-CM | POA: Diagnosis not present

## 2015-06-04 DIAGNOSIS — B351 Tinea unguium: Secondary | ICD-10-CM | POA: Diagnosis not present

## 2015-06-06 DIAGNOSIS — J301 Allergic rhinitis due to pollen: Secondary | ICD-10-CM | POA: Diagnosis not present

## 2015-06-10 DIAGNOSIS — J301 Allergic rhinitis due to pollen: Secondary | ICD-10-CM | POA: Diagnosis not present

## 2015-06-10 DIAGNOSIS — J3089 Other allergic rhinitis: Secondary | ICD-10-CM | POA: Diagnosis not present

## 2015-06-16 DIAGNOSIS — M19012 Primary osteoarthritis, left shoulder: Secondary | ICD-10-CM | POA: Diagnosis not present

## 2015-06-16 DIAGNOSIS — M75112 Incomplete rotator cuff tear or rupture of left shoulder, not specified as traumatic: Secondary | ICD-10-CM | POA: Diagnosis not present

## 2015-06-16 DIAGNOSIS — M7542 Impingement syndrome of left shoulder: Secondary | ICD-10-CM | POA: Diagnosis not present

## 2015-06-16 DIAGNOSIS — S43432D Superior glenoid labrum lesion of left shoulder, subsequent encounter: Secondary | ICD-10-CM | POA: Diagnosis not present

## 2015-06-17 DIAGNOSIS — L03031 Cellulitis of right toe: Secondary | ICD-10-CM | POA: Diagnosis not present

## 2015-06-17 DIAGNOSIS — N4889 Other specified disorders of penis: Secondary | ICD-10-CM | POA: Diagnosis not present

## 2015-06-18 DIAGNOSIS — J3089 Other allergic rhinitis: Secondary | ICD-10-CM | POA: Diagnosis not present

## 2015-06-18 DIAGNOSIS — J301 Allergic rhinitis due to pollen: Secondary | ICD-10-CM | POA: Diagnosis not present

## 2015-06-24 DIAGNOSIS — H5203 Hypermetropia, bilateral: Secondary | ICD-10-CM | POA: Diagnosis not present

## 2015-06-24 DIAGNOSIS — H524 Presbyopia: Secondary | ICD-10-CM | POA: Diagnosis not present

## 2015-06-24 DIAGNOSIS — M19012 Primary osteoarthritis, left shoulder: Secondary | ICD-10-CM | POA: Diagnosis not present

## 2015-06-25 DIAGNOSIS — J3089 Other allergic rhinitis: Secondary | ICD-10-CM | POA: Diagnosis not present

## 2015-06-25 DIAGNOSIS — J301 Allergic rhinitis due to pollen: Secondary | ICD-10-CM | POA: Diagnosis not present

## 2015-06-25 LAB — HM DIABETES EYE EXAM

## 2015-07-01 DIAGNOSIS — L03031 Cellulitis of right toe: Secondary | ICD-10-CM | POA: Diagnosis not present

## 2015-07-02 DIAGNOSIS — J3089 Other allergic rhinitis: Secondary | ICD-10-CM | POA: Diagnosis not present

## 2015-07-02 DIAGNOSIS — J301 Allergic rhinitis due to pollen: Secondary | ICD-10-CM | POA: Diagnosis not present

## 2015-07-09 DIAGNOSIS — J301 Allergic rhinitis due to pollen: Secondary | ICD-10-CM | POA: Diagnosis not present

## 2015-07-10 DIAGNOSIS — M79674 Pain in right toe(s): Secondary | ICD-10-CM | POA: Diagnosis not present

## 2015-07-10 DIAGNOSIS — L03031 Cellulitis of right toe: Secondary | ICD-10-CM | POA: Diagnosis not present

## 2015-07-14 DIAGNOSIS — J301 Allergic rhinitis due to pollen: Secondary | ICD-10-CM | POA: Diagnosis not present

## 2015-07-14 DIAGNOSIS — J3089 Other allergic rhinitis: Secondary | ICD-10-CM | POA: Diagnosis not present

## 2015-07-14 DIAGNOSIS — J4540 Moderate persistent asthma, uncomplicated: Secondary | ICD-10-CM | POA: Diagnosis not present

## 2015-07-16 ENCOUNTER — Encounter: Payer: Self-pay | Admitting: Family Medicine

## 2015-07-17 DIAGNOSIS — G603 Idiopathic progressive neuropathy: Secondary | ICD-10-CM | POA: Diagnosis not present

## 2015-07-17 DIAGNOSIS — G609 Hereditary and idiopathic neuropathy, unspecified: Secondary | ICD-10-CM | POA: Diagnosis not present

## 2015-07-23 DIAGNOSIS — J301 Allergic rhinitis due to pollen: Secondary | ICD-10-CM | POA: Diagnosis not present

## 2015-07-23 DIAGNOSIS — J3089 Other allergic rhinitis: Secondary | ICD-10-CM | POA: Diagnosis not present

## 2015-07-25 ENCOUNTER — Encounter: Payer: Self-pay | Admitting: Family Medicine

## 2015-07-25 MED ORDER — SIMVASTATIN 40 MG PO TABS: ORAL_TABLET | ORAL | Status: DC

## 2015-07-25 MED ORDER — ENALAPRIL MALEATE 2.5 MG PO TABS: ORAL_TABLET | ORAL | Status: DC

## 2015-07-25 MED ORDER — PIOGLITAZONE HCL-METFORMIN HCL 15-850 MG PO TABS: ORAL_TABLET | ORAL | Status: DC

## 2015-07-25 NOTE — Telephone Encounter (Signed)
Medication filled to pharmacy as requested.   

## 2015-07-30 DIAGNOSIS — J301 Allergic rhinitis due to pollen: Secondary | ICD-10-CM | POA: Diagnosis not present

## 2015-07-30 DIAGNOSIS — J3089 Other allergic rhinitis: Secondary | ICD-10-CM | POA: Diagnosis not present

## 2015-08-02 ENCOUNTER — Encounter: Payer: Self-pay | Admitting: Family Medicine

## 2015-08-05 ENCOUNTER — Other Ambulatory Visit: Payer: Self-pay | Admitting: General Practice

## 2015-08-05 MED ORDER — CLOPIDOGREL BISULFATE 75 MG PO TABS: ORAL_TABLET | ORAL | Status: DC

## 2015-08-05 MED ORDER — PIOGLITAZONE HCL-METFORMIN HCL 15-850 MG PO TABS: ORAL_TABLET | ORAL | Status: DC

## 2015-08-05 MED ORDER — SIMVASTATIN 40 MG PO TABS: ORAL_TABLET | ORAL | Status: DC

## 2015-08-05 MED ORDER — ENALAPRIL MALEATE 2.5 MG PO TABS: ORAL_TABLET | ORAL | Status: DC

## 2015-08-07 DIAGNOSIS — M25774 Osteophyte, right foot: Secondary | ICD-10-CM | POA: Diagnosis not present

## 2015-08-07 DIAGNOSIS — G603 Idiopathic progressive neuropathy: Secondary | ICD-10-CM | POA: Diagnosis not present

## 2015-08-07 DIAGNOSIS — M79674 Pain in right toe(s): Secondary | ICD-10-CM | POA: Diagnosis not present

## 2015-08-14 DIAGNOSIS — H35033 Hypertensive retinopathy, bilateral: Secondary | ICD-10-CM | POA: Diagnosis not present

## 2015-08-14 DIAGNOSIS — H25813 Combined forms of age-related cataract, bilateral: Secondary | ICD-10-CM | POA: Diagnosis not present

## 2015-08-14 DIAGNOSIS — E119 Type 2 diabetes mellitus without complications: Secondary | ICD-10-CM | POA: Diagnosis not present

## 2015-08-15 DIAGNOSIS — J3089 Other allergic rhinitis: Secondary | ICD-10-CM | POA: Diagnosis not present

## 2015-08-15 DIAGNOSIS — J301 Allergic rhinitis due to pollen: Secondary | ICD-10-CM | POA: Diagnosis not present

## 2015-08-18 DIAGNOSIS — J301 Allergic rhinitis due to pollen: Secondary | ICD-10-CM | POA: Diagnosis not present

## 2015-08-18 DIAGNOSIS — H2511 Age-related nuclear cataract, right eye: Secondary | ICD-10-CM | POA: Diagnosis not present

## 2015-08-18 DIAGNOSIS — J3089 Other allergic rhinitis: Secondary | ICD-10-CM | POA: Diagnosis not present

## 2015-08-27 DIAGNOSIS — J301 Allergic rhinitis due to pollen: Secondary | ICD-10-CM | POA: Diagnosis not present

## 2015-08-27 DIAGNOSIS — J3089 Other allergic rhinitis: Secondary | ICD-10-CM | POA: Diagnosis not present

## 2015-09-02 DIAGNOSIS — J301 Allergic rhinitis due to pollen: Secondary | ICD-10-CM | POA: Diagnosis not present

## 2015-09-02 DIAGNOSIS — J3089 Other allergic rhinitis: Secondary | ICD-10-CM | POA: Diagnosis not present

## 2015-09-09 ENCOUNTER — Ambulatory Visit: Payer: Medicare Other | Admitting: Family Medicine

## 2015-09-12 DIAGNOSIS — J301 Allergic rhinitis due to pollen: Secondary | ICD-10-CM | POA: Diagnosis not present

## 2015-09-12 DIAGNOSIS — J3089 Other allergic rhinitis: Secondary | ICD-10-CM | POA: Diagnosis not present

## 2015-09-15 DIAGNOSIS — H2511 Age-related nuclear cataract, right eye: Secondary | ICD-10-CM | POA: Diagnosis not present

## 2015-09-15 DIAGNOSIS — H25811 Combined forms of age-related cataract, right eye: Secondary | ICD-10-CM | POA: Diagnosis not present

## 2015-09-16 DIAGNOSIS — J3089 Other allergic rhinitis: Secondary | ICD-10-CM | POA: Diagnosis not present

## 2015-09-16 DIAGNOSIS — J301 Allergic rhinitis due to pollen: Secondary | ICD-10-CM | POA: Diagnosis not present

## 2015-09-22 DIAGNOSIS — H2512 Age-related nuclear cataract, left eye: Secondary | ICD-10-CM | POA: Diagnosis not present

## 2015-09-25 ENCOUNTER — Telehealth: Payer: Self-pay | Admitting: Family Medicine

## 2015-09-25 ENCOUNTER — Encounter: Payer: Self-pay | Admitting: Family Medicine

## 2015-09-25 ENCOUNTER — Ambulatory Visit (INDEPENDENT_AMBULATORY_CARE_PROVIDER_SITE_OTHER): Payer: Medicare Other | Admitting: Family Medicine

## 2015-09-25 VITALS — BP 130/76 | HR 88 | Temp 97.9°F | Resp 16 | Ht 70.0 in | Wt 252.4 lb

## 2015-09-25 DIAGNOSIS — E1142 Type 2 diabetes mellitus with diabetic polyneuropathy: Secondary | ICD-10-CM | POA: Diagnosis not present

## 2015-09-25 DIAGNOSIS — Z23 Encounter for immunization: Secondary | ICD-10-CM

## 2015-09-25 DIAGNOSIS — J3089 Other allergic rhinitis: Secondary | ICD-10-CM | POA: Diagnosis not present

## 2015-09-25 DIAGNOSIS — Z125 Encounter for screening for malignant neoplasm of prostate: Secondary | ICD-10-CM

## 2015-09-25 DIAGNOSIS — J301 Allergic rhinitis due to pollen: Secondary | ICD-10-CM | POA: Diagnosis not present

## 2015-09-25 LAB — HEMOGLOBIN A1C: Hgb A1c MFr Bld: 6.1 % (ref 4.6–6.5)

## 2015-09-25 LAB — PSA, MEDICARE: PSA: 0.96 ng/ml (ref 0.10–4.00)

## 2015-09-25 LAB — BASIC METABOLIC PANEL
BUN: 19 mg/dL (ref 6–23)
CO2: 28 mEq/L (ref 19–32)
Calcium: 9.8 mg/dL (ref 8.4–10.5)
Chloride: 103 mEq/L (ref 96–112)
Creatinine, Ser: 1.06 mg/dL (ref 0.40–1.50)
GFR: 74 mL/min (ref >60.00)
Glucose, Bld: 141 mg/dL — ABNORMAL HIGH (ref 70–99)
Potassium: 4.7 mEq/L (ref 3.5–5.1)
Sodium: 141 mEq/L (ref 135–145)

## 2015-09-25 NOTE — Assessment & Plan Note (Signed)
Chronic problem for pt.  Tolerating Actoplus Met w/o difficulty.  UTD on eye exam and foot exam.  On ACE for renal protection.  Stressed need for healthy diet and regular exercise.  Check labs.  Adjust meds prn  

## 2015-09-25 NOTE — Telephone Encounter (Signed)
Ok with me- he is a very nice man and i'll miss him!

## 2015-09-25 NOTE — Patient Instructions (Signed)
Follow up in 3-4 months to recheck diabetes, cholesterol (schedule with your new provider) We'll notify you of your lab results and make any changes if needed Continue to work on healthy diet and regular exercise Call with any questions or concerns Hang in there! Happy Holidays!!!

## 2015-09-25 NOTE — Telephone Encounter (Signed)
Current pt of Dr. Beverely Lowabori wanting to transfer to Dr. Drue NovelPaz since he has seen him before and liked him. Pt is [employer]. Please advise.

## 2015-09-25 NOTE — Telephone Encounter (Signed)
Okay to schedule new patient appt, okay to put 2-15 minute appts together if needed.

## 2015-09-25 NOTE — Progress Notes (Signed)
   Subjective:    Patient ID: Ryan Zhang, male    DOB: 1946-10-27, 69 y.o.   MRN: [id number]  HPI DM- chronic problem, on Actoplus Met.  On Enalapril for renal protection.  UTD on foot exam w/ podiatry.  UTD on eye exam.  Not checking CBGs.  Denies symptomatic lows.  Denies CP, SOB, HAs, visual changes, edema, abd pain, N/V.   Review of Systems For ROS see HPI     Objective:   Physical Exam  Constitutional: He is oriented to person, place, and time. He appears well-developed and well-nourished. No distress.  obese  HENT:  Head: Normocephalic and atraumatic.  Eyes: Conjunctivae and EOM are normal. Pupils are equal, round, and reactive to light.  Neck: Normal range of motion. Neck supple. No thyromegaly present.  Cardiovascular: Normal rate, regular rhythm, normal heart sounds and intact distal pulses.   No murmur heard. Pulmonary/Chest: Effort normal and breath sounds normal. No respiratory distress.  Abdominal: Soft. Bowel sounds are normal. He exhibits no distension.  Musculoskeletal: He exhibits no edema.  Lymphadenopathy:    He has no cervical adenopathy.  Neurological: He is alert and oriented to person, place, and time. No cranial nerve deficit.  Skin: Skin is warm and dry.  Psychiatric: He has a normal mood and affect. His behavior is normal.  Vitals reviewed.         Assessment & Plan:

## 2015-09-25 NOTE — Telephone Encounter (Signed)
Yes, please schedule an appointment at his convenience

## 2015-09-25 NOTE — Progress Notes (Signed)
Pre visit review using our clinic review tool, if applicable. No additional management support is needed unless otherwise documented below in the visit note. 

## 2015-09-25 NOTE — Telephone Encounter (Signed)
Please advise 

## 2015-09-29 DIAGNOSIS — J3089 Other allergic rhinitis: Secondary | ICD-10-CM | POA: Diagnosis not present

## 2015-09-29 DIAGNOSIS — H25812 Combined forms of age-related cataract, left eye: Secondary | ICD-10-CM | POA: Diagnosis not present

## 2015-09-29 DIAGNOSIS — J301 Allergic rhinitis due to pollen: Secondary | ICD-10-CM | POA: Diagnosis not present

## 2015-09-29 DIAGNOSIS — H2512 Age-related nuclear cataract, left eye: Secondary | ICD-10-CM | POA: Diagnosis not present

## 2015-09-30 DIAGNOSIS — J301 Allergic rhinitis due to pollen: Secondary | ICD-10-CM | POA: Diagnosis not present

## 2015-09-30 DIAGNOSIS — J3089 Other allergic rhinitis: Secondary | ICD-10-CM | POA: Diagnosis not present

## 2015-10-06 ENCOUNTER — Encounter: Payer: Self-pay | Admitting: Family Medicine

## 2015-10-06 ENCOUNTER — Telehealth: Payer: Self-pay | Admitting: Family Medicine

## 2015-10-06 DIAGNOSIS — M79674 Pain in right toe(s): Secondary | ICD-10-CM

## 2015-10-06 NOTE — Telephone Encounter (Signed)
Relation to ZO:XWRUpt:self Call back number:[phone] and [phone]   Reason for call:  Patient would like to discuss with PCP regarding his upcoming foot surgery on his big toe.

## 2015-10-06 NOTE — Telephone Encounter (Signed)
Spoke with pt who advised that his wife is not comfortable with Dr. Deirdre PriestAljouny? Performing upcoming foot surgery. Pt was given the name and phone number of Dr. Leeanne Deeduchman whom he seen last year. Pt is calling to verify if a new referral is needed to be evaluated for a second opinion.

## 2015-10-07 DIAGNOSIS — J301 Allergic rhinitis due to pollen: Secondary | ICD-10-CM | POA: Diagnosis not present

## 2015-10-07 DIAGNOSIS — J3089 Other allergic rhinitis: Secondary | ICD-10-CM | POA: Diagnosis not present

## 2015-10-07 NOTE — Telephone Encounter (Signed)
Referral placed.

## 2015-10-15 ENCOUNTER — Encounter: Payer: Medicare Other | Admitting: Family Medicine

## 2015-10-15 DIAGNOSIS — J3089 Other allergic rhinitis: Secondary | ICD-10-CM | POA: Diagnosis not present

## 2015-10-15 DIAGNOSIS — J301 Allergic rhinitis due to pollen: Secondary | ICD-10-CM | POA: Diagnosis not present

## 2015-10-17 DIAGNOSIS — N480 Leukoplakia of penis: Secondary | ICD-10-CM | POA: Diagnosis not present

## 2015-10-17 DIAGNOSIS — N390 Urinary tract infection, site not specified: Secondary | ICD-10-CM | POA: Diagnosis not present

## 2015-10-21 ENCOUNTER — Ambulatory Visit (INDEPENDENT_AMBULATORY_CARE_PROVIDER_SITE_OTHER): Payer: Medicare Other

## 2015-10-21 ENCOUNTER — Encounter: Payer: Self-pay | Admitting: Podiatry

## 2015-10-21 ENCOUNTER — Ambulatory Visit (INDEPENDENT_AMBULATORY_CARE_PROVIDER_SITE_OTHER): Payer: Medicare Other | Admitting: Podiatry

## 2015-10-21 VITALS — BP 154/97 | HR 82 | Resp 12

## 2015-10-21 DIAGNOSIS — J301 Allergic rhinitis due to pollen: Secondary | ICD-10-CM | POA: Diagnosis not present

## 2015-10-21 DIAGNOSIS — M2021 Hallux rigidus, right foot: Secondary | ICD-10-CM

## 2015-10-21 DIAGNOSIS — M79674 Pain in right toe(s): Secondary | ICD-10-CM

## 2015-10-21 DIAGNOSIS — J3089 Other allergic rhinitis: Secondary | ICD-10-CM | POA: Diagnosis not present

## 2015-10-21 MED ORDER — DICLOFENAC SODIUM 1 % TD GEL: 2.0000 g | Freq: Four times a day (QID) | TRANSDERMAL | Status: DC

## 2015-10-21 NOTE — Patient Instructions (Signed)
Today your examination demonstrated an arthritic great toe joint without any evidence of floating bone in the big toe Apply 2 g of diclofenac gel to the side of the right big toe daily 2-4 times Return as needed

## 2015-10-21 NOTE — Progress Notes (Signed)
   Subjective:    Patient ID: Ryan Zhang, male    DOB: Mar 30, 1946, 69 y.o.   MRN: [id number]  HPI    This patient presents today with approximately one-year history of intermittent sharp pain localized around the lateral aspect of the right hallux. The symptoms can occur as often as every 2-3 days with multiple episodes lasting 10 minutes that resolved spontaneously to occasional episodes and a 2-3 week.. The symptoms occur on and off weightbearing. The symptoms have gradually worsened in intensity over time. Patient has had no specific treatment for the problem other than relative rest when the painful episodes occur. Patient states that he is seeing another podiatrist who diagnosed the problem is a floating portion of bone in the toe and there was a discussion of possible surgical treatment for this problem please note patient takes gabapentin for psychiatric issues 400 mg 3 times a day which apparently has no affect on the painful episodes he describes in the right hallux   Review of Systems  HENT: Positive for hearing loss.   Musculoskeletal: Positive for gait problem.  Skin: Positive for color change.  Psychiatric/Behavioral: Positive for confusion.       Objective:   Physical Exam   patient appears orientated 3 with wife present to treatment room  Vascular: No peripheral edema noted bilaterally DP and PT pulses 2/4 bilaterally Capillary reflex immediate bilaterally  Dermatological: The right hallux nail margin is narrowed without any skin lesions noted in the area No open skin lesions noted bilaterally  Neurological: Sensation to 10 g monofilament wire intact 5/5 bilaterally Vibratory sensation reactive bilaterally Ankle reflex equal and reactive bilaterally  Musculoskeletal: Palpation in the distal hallux and nail area elicits no discomfort or palpable lesions Palpation the right first MPJ causes discomfort with direct palpation and upon in range of motion dorsi  flexion which is extremely limited. Also, left first MPJ is limited range of motion with some tenderness as well to palpation and range of motion    tray examination the right foot dated 10/21/2015 demonstrates Intact bony structures without fracture or dislocation Narrowing of the first MPJ with bony proliferation about the joint  Radiographic impression: No acute bony abnormality noted right foot Osteoarthritis first MPJ right    Chart review of arterial Doppler dated 01/17/2014 demonstrated a normal lower extremity arterial duplex Doppler      Assessment & Plan:    assessment: Hallux rigidus right with with referred pain to right hallux versus mono neuropathy   satisfactory neurovascular status   Plan: Today review the results of x-ray examination with patient today. I made aware there is no evidence of "floating bone in the toe, rather osteoarthritic right first MPJ. As a trial I Rx diclofenac gel to apply 2 g up to 4 times a day to the lateral margin right hallux toe to see if this reduces some of the symptoms. If he noticed that the pain reduces he could continue applying the gel as needed If patient does not have a positive response to the diclofenac gel I would consider a turf toe extension on an orthotic as a possibility

## 2015-10-27 DIAGNOSIS — J3089 Other allergic rhinitis: Secondary | ICD-10-CM | POA: Diagnosis not present

## 2015-10-27 DIAGNOSIS — J301 Allergic rhinitis due to pollen: Secondary | ICD-10-CM | POA: Diagnosis not present

## 2015-10-31 DIAGNOSIS — J301 Allergic rhinitis due to pollen: Secondary | ICD-10-CM | POA: Diagnosis not present

## 2015-10-31 DIAGNOSIS — J3089 Other allergic rhinitis: Secondary | ICD-10-CM | POA: Diagnosis not present

## 2015-11-04 ENCOUNTER — Encounter: Payer: Medicare Other | Admitting: Family Medicine

## 2015-11-04 DIAGNOSIS — J3089 Other allergic rhinitis: Secondary | ICD-10-CM | POA: Diagnosis not present

## 2015-11-04 DIAGNOSIS — J301 Allergic rhinitis due to pollen: Secondary | ICD-10-CM | POA: Diagnosis not present

## 2015-11-06 DIAGNOSIS — J301 Allergic rhinitis due to pollen: Secondary | ICD-10-CM | POA: Diagnosis not present

## 2015-11-06 DIAGNOSIS — J3089 Other allergic rhinitis: Secondary | ICD-10-CM | POA: Diagnosis not present

## 2015-11-18 DIAGNOSIS — J4540 Moderate persistent asthma, uncomplicated: Secondary | ICD-10-CM | POA: Diagnosis not present

## 2015-11-18 DIAGNOSIS — R062 Wheezing: Secondary | ICD-10-CM | POA: Diagnosis not present

## 2015-11-18 DIAGNOSIS — J019 Acute sinusitis, unspecified: Secondary | ICD-10-CM | POA: Diagnosis not present

## 2015-11-20 ENCOUNTER — Encounter: Payer: Self-pay | Admitting: Family Medicine

## 2015-12-03 DIAGNOSIS — J3089 Other allergic rhinitis: Secondary | ICD-10-CM | POA: Diagnosis not present

## 2015-12-03 DIAGNOSIS — J301 Allergic rhinitis due to pollen: Secondary | ICD-10-CM | POA: Diagnosis not present

## 2015-12-17 DEATH — deceased

## 2015-12-19 DIAGNOSIS — Z96651 Presence of right artificial knee joint: Secondary | ICD-10-CM | POA: Diagnosis not present

## 2015-12-19 DIAGNOSIS — Z471 Aftercare following joint replacement surgery: Secondary | ICD-10-CM | POA: Diagnosis not present

## 2015-12-19 DIAGNOSIS — Z96652 Presence of left artificial knee joint: Secondary | ICD-10-CM | POA: Diagnosis not present

## 2015-12-19 DIAGNOSIS — Z96653 Presence of artificial knee joint, bilateral: Secondary | ICD-10-CM | POA: Diagnosis not present

## 2015-12-24 DIAGNOSIS — J301 Allergic rhinitis due to pollen: Secondary | ICD-10-CM | POA: Diagnosis not present

## 2015-12-24 DIAGNOSIS — J3089 Other allergic rhinitis: Secondary | ICD-10-CM | POA: Diagnosis not present

## 2016-01-07 DIAGNOSIS — J3089 Other allergic rhinitis: Secondary | ICD-10-CM | POA: Diagnosis not present

## 2016-01-07 DIAGNOSIS — J301 Allergic rhinitis due to pollen: Secondary | ICD-10-CM | POA: Diagnosis not present

## 2016-01-12 ENCOUNTER — Encounter: Payer: Self-pay | Admitting: Internal Medicine

## 2016-01-12 ENCOUNTER — Ambulatory Visit (INDEPENDENT_AMBULATORY_CARE_PROVIDER_SITE_OTHER): Payer: Medicare Other | Admitting: Internal Medicine

## 2016-01-12 VITALS — BP 124/72 | HR 92 | Temp 98.2°F | Ht 70.0 in | Wt 252.1 lb

## 2016-01-12 DIAGNOSIS — E119 Type 2 diabetes mellitus without complications: Secondary | ICD-10-CM | POA: Diagnosis not present

## 2016-01-12 DIAGNOSIS — Z7189 Other specified counseling: Secondary | ICD-10-CM | POA: Diagnosis not present

## 2016-01-12 DIAGNOSIS — I10 Essential (primary) hypertension: Secondary | ICD-10-CM

## 2016-01-12 DIAGNOSIS — Z7689 Persons encountering health services in other specified circumstances: Secondary | ICD-10-CM

## 2016-01-12 DIAGNOSIS — R911 Solitary pulmonary nodule: Secondary | ICD-10-CM

## 2016-01-12 DIAGNOSIS — Z09 Encounter for follow-up examination after completed treatment for conditions other than malignant neoplasm: Secondary | ICD-10-CM | POA: Insufficient documentation

## 2016-01-12 LAB — BASIC METABOLIC PANEL
BUN: 18 mg/dL (ref 6–23)
CO2: 24 mEq/L (ref 19–32)
Calcium: 9.1 mg/dL (ref 8.4–10.5)
Chloride: 105 mEq/L (ref 96–112)
Creatinine, Ser: 0.91 mg/dL (ref 0.40–1.50)
GFR: 88.16 mL/min (ref >60.00)
Glucose, Bld: 184 mg/dL — ABNORMAL HIGH (ref 70–99)
Potassium: 4.2 mEq/L (ref 3.5–5.1)
Sodium: 137 mEq/L (ref 135–145)

## 2016-01-12 LAB — HEMOGLOBIN A1C: Hgb A1c MFr Bld: 6.6 % — ABNORMAL HIGH (ref 4.6–6.5)

## 2016-01-12 LAB — ALT: ALT: 14 U/L (ref 0–53)

## 2016-01-12 LAB — AST: AST: 18 U/L (ref 0–37)

## 2016-01-12 NOTE — Patient Instructions (Signed)
GO TO THE LAB : Get the blood work    GO TO THE FRONT DESK  Schedule a routine office visit or check up to be done in  3-4 months  Please be fasting   Front desk:   30

## 2016-01-12 NOTE — Assessment & Plan Note (Signed)
DM: Continue with Vasotec, Actos plus met, check A1c. HTN: Continue Vasotec, check a BMP. High cholesterol: Continue simvastatin, check AST, ALT. Abnormal CT chest: Recheck a CT. RTC 3-4 months

## 2016-01-12 NOTE — Progress Notes (Signed)
Subjective:    Patient ID: Ryan Zhang, male    DOB: 1946/09/20, 70 y.o.   MRN: [id number]  DOS:  01/12/2016 Type of visit - description : New patient, transferring from Dr. Birdie Riddle. Interval history: New patient, chart reviewed. DM: Good compliance of medication without apparent side effects HTN: good compliance with meds, BP today is excellent High cholesterol, on statins, recent FLP satisfactory Bipolar depression: Sees psych  regularly.   Review of Systems  Denies chest pain or difficulty breathing No nausea, vomiting, diarrhea. Occasional wheezing mostly with URIs. Sees the allergy specialist Past Medical History  Diagnosis Date  . Diabetes mellitus   . Hyperlipidemia   . Hypertension   . Bipolar affective ([place])   . Allergy     allergy shots Dr. Velora Heckler  . DJD (degenerative joint disease)   . Obesity   . Asthma     moderate persistant  . GERD (gastroesophageal reflux disease)   . Meniere's disease   . Hollenhorst plaque     right eye  . Personal history of colonic polyps 11/2004    hyperplastic.  [name] Dysphagia   . Kidney stones   . Pancreatitis   . Hepatitis A     as teenager 65's  . Gallstones   . Motility disorder, esophageal   . Stroke [facility])     per MRI    Past Surgical History  Procedure Laterality Date  . Cholecystectomy    . Total knee arthroplasty      both knees  . Cataract extraction Bilateral 09/2015 and 10/2015    Social History   Social History  . Marital Status: Married    Spouse Name: N/A  . Number of Children: 1  . Years of Education: N/A   Occupational History  . disabled     laboratory computing/programming   Social History Main Topics  . Smoking status: Former Smoker    Quit date: 11/15/1964  . Smokeless tobacco: Never Used  . Alcohol Use: No  . Drug Use: No  . Sexual Activity: Not on file   Other Topics Concern  . Not on file   Social History Narrative   Household-- pt , wife, daughter (h/o substance abuse,on a  methadone program, doing better )        Medication List       This list is accurate as of: 01/12/16  6:10 PM.  Always use your most recent med list.               ammonium lactate 12 % cream  Commonly known as:  LAC-HYDRIN  Apply topically as needed for dry skin.     benztropine 2 MG tablet  Commonly known as:  COGENTIN  Take 2 mg by mouth every evening.     betamethasone dipropionate 0.05 % ointment  Commonly known as:  DIPROLENE  Apply topically 2 (two) times daily.     budesonide-formoterol 160-4.5 MCG/ACT inhaler  Commonly known as:  SYMBICORT  Inhale 2 puffs into the lungs 2 (two) times daily.     clonazePAM 0.5 MG tablet  Commonly known as:  KLONOPIN  Take 0.5 mg by mouth 3 (three) times daily.     clopidogrel 75 MG tablet  Commonly known as:  PLAVIX  Take 1 tablet by mouth  daily     CYMBALTA 60 MG capsule  Generic drug:  DULoxetine  Take 60 mg by mouth every morning.     diclofenac sodium 1 % Gel  Commonly  known as:  VOLTAREN  Apply 2 g topically 4 (four) times daily.     enalapril 2.5 MG tablet  Commonly known as:  VASOTEC  Take 1 tablet by mouth  daily     EPIPEN 2-PAK 0.3 mg/0.3 mL Soaj injection  Generic drug:  EPINEPHrine  Reported on 01/12/2016     esomeprazole 40 MG capsule  Commonly known as:  NEXIUM  Take 40 mg by mouth every evening.     fexofenadine 180 MG tablet  Commonly known as:  ALLEGRA  Take 180 mg by mouth every morning.     fluticasone 50 MCG/ACT nasal spray  Commonly known as:  FLONASE  Place 1 spray into both nostrils daily.     gabapentin 100 MG capsule  Commonly known as:  NEURONTIN     gabapentin 400 MG capsule  Commonly known as:  NEURONTIN     glucose blood test strip  Commonly known as:  ONE TOUCH ULTRA TEST  Use one strip each time, glucose levels are tested. Pt tests 1-2 times daily. Dx. E11.9     HYDROcodone-acetaminophen 5-325 MG tablet  Commonly known as:  NORCO/VICODIN  Take 1 tablet by mouth every 4  (four) hours as needed for moderate pain or severe pain.     lamoTRIgine 25 MG tablet  Commonly known as:  LAMICTAL  Take 50 mg by mouth at bedtime.     meloxicam 15 MG tablet  Commonly known as:  MOBIC  Take 1 tablet (15 mg total) by mouth daily.     multivitamin tablet  Take 1 tablet by mouth daily. Senior complex     pioglitazone-metformin 15-850 MG tablet  Commonly known as:  ACTOPLUS MET  Take 1 tablet by mouth two  times daily with meals     SAPHRIS 5 MG Subl 24 hr tablet  Generic drug:  asenapine  Place 5 mg under the tongue at bedtime.     simvastatin 40 MG tablet  Commonly known as:  ZOCOR  Take 1 tablet by mouth at  bedtime     traZODone 50 MG tablet  Commonly known as:  DESYREL  Take 150 mg by mouth.           Objective:   Physical Exam BP 124/72 mmHg  Pulse 92  Temp(Src) 98.2 F (36.8 C) (Oral)  Ht 5' 10" (1.778 m)  Wt 252 lb 2 oz (114.363 kg)  BMI 36.18 kg/m2  SpO2 93% General:   Well developed, well nourished . NAD.  HEENT:  Normocephalic . Face symmetric, atraumatic Lungs:  CTA B Normal respiratory effort, no intercostal retractions, no accessory muscle use. Heart: RRR,  no murmur.  no pretibial edema bilaterally  Abdomen:  Not distended, soft, non-tender. No rebound or rigidity. No mass,organomegaly Skin: Not pale. Not jaundice Neurologic:  alert & oriented X3.  Speech normal, gait appropriate for age and unassisted Psych--  Cognition and judgment appear intact.  Cooperative with normal attention span and concentration.  Behavior appropriate. No anxious or depressed appearing.    Assessment & Plan:   Assessment DM HTN Hyperlipidemia Morbid obesity (BMI 36 plus DM) Neuro: --? stroke : saw neuro 2013 d/t B transient visual loss, ASA changed to plavix.  --See CTA, MRIs of head-neck report  --Saw neuro 11-2013: fall, syncope,parkinsonism d/t sapharis? Nl LE arterial dopplers 2015 Bipolar, Depression -- see psychiatry DJD--  hydrocodone rarely rx by [place] ortho Asthma-Allergies -- Dr [name]  GERD, h/o dysphagia H/o Mnire's disease  [facility]  CT chest 07-2014 RML  79m  PLAN: DM: Continue with Vasotec, Actos plus met, check A1c. HTN: Continue Vasotec, check a BMP. High cholesterol: Continue simvastatin, check AST, ALT. Morbid obesity: Diet and exercise encourage Abnormal CT chest: Recheck a CT. RTC 3-4 months

## 2016-01-12 NOTE — Progress Notes (Signed)
Pre visit review using our clinic review tool, if applicable. No additional management support is needed unless otherwise documented below in the visit note. 

## 2016-01-13 ENCOUNTER — Ambulatory Visit (HOSPITAL_BASED_OUTPATIENT_CLINIC_OR_DEPARTMENT_OTHER)
Admission: RE | Admit: 2016-01-13 | Discharge: 2016-01-13 | Disposition: A | Discharge status: Home / Self Care | Payer: Medicare Other | Source: Ambulatory Visit | Attending: Internal Medicine | Admitting: Internal Medicine

## 2016-01-13 DIAGNOSIS — J984 Other disorders of lung: Secondary | ICD-10-CM | POA: Diagnosis not present

## 2016-01-13 DIAGNOSIS — I2510 Atherosclerotic heart disease of native coronary artery without angina pectoris: Secondary | ICD-10-CM | POA: Insufficient documentation

## 2016-01-13 DIAGNOSIS — R911 Solitary pulmonary nodule: Secondary | ICD-10-CM | POA: Insufficient documentation

## 2016-01-13 DIAGNOSIS — R918 Other nonspecific abnormal finding of lung field: Secondary | ICD-10-CM | POA: Insufficient documentation

## 2016-01-15 ENCOUNTER — Telehealth: Payer: Self-pay | Admitting: Internal Medicine

## 2016-01-15 DIAGNOSIS — J3089 Other allergic rhinitis: Secondary | ICD-10-CM | POA: Diagnosis not present

## 2016-01-15 DIAGNOSIS — J4540 Moderate persistent asthma, uncomplicated: Secondary | ICD-10-CM | POA: Diagnosis not present

## 2016-01-15 DIAGNOSIS — J301 Allergic rhinitis due to pollen: Secondary | ICD-10-CM | POA: Diagnosis not present

## 2016-01-15 NOTE — Telephone Encounter (Signed)
CT 01-13-16 RML nodule >>> stable , benign LLL linear scar >> f/u 6 months  Enlarged Ascending Ao 3.9 cm: f/u yearly  Plan: CT chest in 6 months and 1 year. Patient aware and in agreement

## 2016-01-16 ENCOUNTER — Encounter: Payer: Self-pay | Admitting: Internal Medicine

## 2016-01-18 ENCOUNTER — Other Ambulatory Visit: Payer: Self-pay | Admitting: Internal Medicine

## 2016-01-18 MED ORDER — BETAMETHASONE DIPROPIONATE 0.05 % EX OINT: TOPICAL_OINTMENT | Freq: Two times a day (BID) | CUTANEOUS | Status: DC | PRN

## 2016-01-21 DIAGNOSIS — J301 Allergic rhinitis due to pollen: Secondary | ICD-10-CM | POA: Diagnosis not present

## 2016-01-21 DIAGNOSIS — J3089 Other allergic rhinitis: Secondary | ICD-10-CM | POA: Diagnosis not present

## 2016-01-28 DIAGNOSIS — J301 Allergic rhinitis due to pollen: Secondary | ICD-10-CM | POA: Diagnosis not present

## 2016-01-28 DIAGNOSIS — J3089 Other allergic rhinitis: Secondary | ICD-10-CM | POA: Diagnosis not present

## 2016-02-04 DIAGNOSIS — J301 Allergic rhinitis due to pollen: Secondary | ICD-10-CM | POA: Diagnosis not present

## 2016-02-04 DIAGNOSIS — J3089 Other allergic rhinitis: Secondary | ICD-10-CM | POA: Diagnosis not present

## 2016-02-11 DIAGNOSIS — J3089 Other allergic rhinitis: Secondary | ICD-10-CM | POA: Diagnosis not present

## 2016-02-11 DIAGNOSIS — J301 Allergic rhinitis due to pollen: Secondary | ICD-10-CM | POA: Diagnosis not present

## 2016-02-18 DIAGNOSIS — J301 Allergic rhinitis due to pollen: Secondary | ICD-10-CM | POA: Diagnosis not present

## 2016-02-18 DIAGNOSIS — J3089 Other allergic rhinitis: Secondary | ICD-10-CM | POA: Diagnosis not present

## 2016-02-25 DIAGNOSIS — J301 Allergic rhinitis due to pollen: Secondary | ICD-10-CM | POA: Diagnosis not present

## 2016-02-25 DIAGNOSIS — J3089 Other allergic rhinitis: Secondary | ICD-10-CM | POA: Diagnosis not present

## 2016-03-03 DIAGNOSIS — J3089 Other allergic rhinitis: Secondary | ICD-10-CM | POA: Diagnosis not present

## 2016-03-03 DIAGNOSIS — J301 Allergic rhinitis due to pollen: Secondary | ICD-10-CM | POA: Diagnosis not present

## 2016-03-08 ENCOUNTER — Other Ambulatory Visit: Payer: Self-pay | Admitting: Family Medicine

## 2016-03-10 DIAGNOSIS — J301 Allergic rhinitis due to pollen: Secondary | ICD-10-CM | POA: Diagnosis not present

## 2016-03-10 DIAGNOSIS — J3089 Other allergic rhinitis: Secondary | ICD-10-CM | POA: Diagnosis not present

## 2016-03-10 NOTE — Telephone Encounter (Signed)
Refill sent per [facility] refill protocol/SLS  

## 2016-03-17 DIAGNOSIS — J301 Allergic rhinitis due to pollen: Secondary | ICD-10-CM | POA: Diagnosis not present

## 2016-03-17 DIAGNOSIS — J3089 Other allergic rhinitis: Secondary | ICD-10-CM | POA: Diagnosis not present

## 2016-03-25 DIAGNOSIS — J301 Allergic rhinitis due to pollen: Secondary | ICD-10-CM | POA: Diagnosis not present

## 2016-03-25 DIAGNOSIS — J3089 Other allergic rhinitis: Secondary | ICD-10-CM | POA: Diagnosis not present

## 2016-03-26 ENCOUNTER — Encounter: Payer: Self-pay | Admitting: Gastroenterology

## 2016-03-31 DIAGNOSIS — J3089 Other allergic rhinitis: Secondary | ICD-10-CM | POA: Diagnosis not present

## 2016-03-31 DIAGNOSIS — J301 Allergic rhinitis due to pollen: Secondary | ICD-10-CM | POA: Diagnosis not present

## 2016-04-07 DIAGNOSIS — J3089 Other allergic rhinitis: Secondary | ICD-10-CM | POA: Diagnosis not present

## 2016-04-07 DIAGNOSIS — J301 Allergic rhinitis due to pollen: Secondary | ICD-10-CM | POA: Diagnosis not present

## 2016-04-14 DIAGNOSIS — J3089 Other allergic rhinitis: Secondary | ICD-10-CM | POA: Diagnosis not present

## 2016-04-14 DIAGNOSIS — J301 Allergic rhinitis due to pollen: Secondary | ICD-10-CM | POA: Diagnosis not present

## 2016-04-16 DIAGNOSIS — J301 Allergic rhinitis due to pollen: Secondary | ICD-10-CM | POA: Diagnosis not present

## 2016-04-16 DIAGNOSIS — J3089 Other allergic rhinitis: Secondary | ICD-10-CM | POA: Diagnosis not present

## 2016-04-21 DIAGNOSIS — J3089 Other allergic rhinitis: Secondary | ICD-10-CM | POA: Diagnosis not present

## 2016-04-21 DIAGNOSIS — J301 Allergic rhinitis due to pollen: Secondary | ICD-10-CM | POA: Diagnosis not present

## 2016-04-28 DIAGNOSIS — J301 Allergic rhinitis due to pollen: Secondary | ICD-10-CM | POA: Diagnosis not present

## 2016-04-28 DIAGNOSIS — J3089 Other allergic rhinitis: Secondary | ICD-10-CM | POA: Diagnosis not present

## 2016-04-29 ENCOUNTER — Ambulatory Visit (INDEPENDENT_AMBULATORY_CARE_PROVIDER_SITE_OTHER): Payer: Medicare Other | Admitting: Internal Medicine

## 2016-04-29 ENCOUNTER — Encounter: Payer: Self-pay | Admitting: Internal Medicine

## 2016-04-29 VITALS — BP 118/76 | HR 80 | Temp 97.6°F | Ht 70.0 in | Wt 244.5 lb

## 2016-04-29 DIAGNOSIS — Z1159 Encounter for screening for other viral diseases: Secondary | ICD-10-CM

## 2016-04-29 DIAGNOSIS — E785 Hyperlipidemia, unspecified: Secondary | ICD-10-CM | POA: Diagnosis not present

## 2016-04-29 DIAGNOSIS — D649 Anemia, unspecified: Secondary | ICD-10-CM | POA: Diagnosis not present

## 2016-04-29 DIAGNOSIS — E118 Type 2 diabetes mellitus with unspecified complications: Secondary | ICD-10-CM

## 2016-04-29 DIAGNOSIS — I10 Essential (primary) hypertension: Secondary | ICD-10-CM

## 2016-04-29 MED ORDER — LOSARTAN POTASSIUM 25 MG PO TABS: 25.0000 mg | ORAL_TABLET | Freq: Every day | ORAL | Status: DC

## 2016-04-29 NOTE — Patient Instructions (Addendum)
GO TO THE FRONT DESK  Schedule labs to be done in 2 weeks from today, fasting: A1c ----diabetes BMP--- HTN FLP----- high cholesterol CBC, iron, ferritin ------anemia Hep C ----- hep C screening   Schedule your next appointment for a  complete physical exam in 3 months   Stop Vasotec Start losartan 1 tablet daily   Check the  blood pressure 2 or 3 times a  weekly  Be sure your blood pressure is between 110/65 and  145/85. If it is consistently higher or lower, let me know

## 2016-04-29 NOTE — Progress Notes (Signed)
Pre visit review using our clinic review tool, if applicable. No additional management support is needed unless otherwise documented below in the visit note. 

## 2016-04-29 NOTE — Progress Notes (Signed)
Subjective:    Patient ID: Ryan Zhang, male    DOB: 25-Feb-1946, 70 y.o.   MRN: [id number]  DOS:  04/29/2016 Type of visit - description : Routine checkup Interval history: Reports persistent cough for a few months since he had a cold. Occasional clear sputum. Reports GERD symptoms are well-controlled except for chronic occasional dysphagia. Some postnasal dripping. He is on Vasotec. Requests a Hep C screening DJD: Only taking ibuprofen as needed, take it sporadically. DM: Good med compliance, blood sugars in the morning 130s, in the afternoon 100s High cholesterol: Good compliance with meds.    Review of Systems Denies chest pain or difficulty breathing No nausea, vomiting, diarrhea or blood in the stools. No lower extremity paresthesias Occasional difficulty with his memory , already discussed with psychiatry  Past Medical History  Diagnosis Date  . Diabetes mellitus   . Hyperlipidemia   . Hypertension   . Bipolar affective ([place])   . Allergy     allergy shots Dr. Velora Heckler  . DJD (degenerative joint disease)   . Obesity   . Asthma     moderate persistant  . GERD (gastroesophageal reflux disease)   . Meniere's disease   . Hollenhorst plaque     right eye  . Personal history of colonic polyps 11/2004    hyperplastic.  [name] Dysphagia   . Kidney stones   . Pancreatitis   . Hepatitis A     as teenager 33's  . Gallstones   . Motility disorder, esophageal   . Stroke [facility])     per MRI    Past Surgical History  Procedure Laterality Date  . Cholecystectomy    . Total knee arthroplasty      both knees  . Cataract extraction Bilateral 09/2015 and 10/2015    Social History   Social History  . Marital Status: Married    Spouse Name: N/A  . Number of Children: 1  . Years of Education: N/A   Occupational History  . disabled     laboratory computing/programming   Social History Main Topics  . Smoking status: Former Smoker    Quit date: 11/15/1964  . Smokeless  tobacco: Never Used  . Alcohol Use: No  . Drug Use: No  . Sexual Activity: Not on file   Other Topics Concern  . Not on file   Social History Narrative   Household-- pt , wife, daughter (h/o substance abuse,on a methadone program, doing better )        Medication List       This list is accurate as of: 04/29/16  7:16 PM.  Always use your most recent med list.               benztropine 2 MG tablet  Commonly known as:  COGENTIN  Take 2 mg by mouth every evening.     betamethasone dipropionate 0.05 % ointment  Commonly known as:  DIPROLENE  Apply topically 2 (two) times daily as needed.     budesonide-formoterol 160-4.5 MCG/ACT inhaler  Commonly known as:  SYMBICORT  Inhale 2 puffs into the lungs 2 (two) times daily.     clonazePAM 0.5 MG tablet  Commonly known as:  KLONOPIN  Take 0.5 mg by mouth 3 (three) times daily.     clopidogrel 75 MG tablet  Commonly known as:  PLAVIX  TAKE 1 TABLET BY MOUTH ONCE A DAY     CYMBALTA 60 MG capsule  Generic drug:  DULoxetine  Take 60 mg by mouth every morning.     diclofenac sodium 1 % Gel  Commonly known as:  VOLTAREN  Apply 2 g topically 4 (four) times daily.     EPIPEN 2-PAK 0.3 mg/0.3 mL Soaj injection  Generic drug:  EPINEPHrine  Reported on 04/29/2016     esomeprazole 40 MG capsule  Commonly known as:  NEXIUM  Take 40 mg by mouth every evening.     fexofenadine 180 MG tablet  Commonly known as:  ALLEGRA  Take 180 mg by mouth every morning.     fluticasone 50 MCG/ACT nasal spray  Commonly known as:  FLONASE  Place 1 spray into both nostrils daily.     gabapentin 100 MG capsule  Commonly known as:  NEURONTIN     gabapentin 400 MG capsule  Commonly known as:  NEURONTIN     glucose blood test strip  Commonly known as:  ONE TOUCH ULTRA TEST  Use one strip each time, glucose levels are tested. Pt tests 1-2 times daily. Dx. E11.9     HYDROcodone-acetaminophen 5-325 MG tablet  Commonly known as:   NORCO/VICODIN  Take 1 tablet by mouth every 4 (four) hours as needed for moderate pain or severe pain.     lamoTRIgine 25 MG tablet  Commonly known as:  LAMICTAL  Take 50 mg by mouth at bedtime.     losartan 25 MG tablet  Commonly known as:  COZAAR  Take 1 tablet (25 mg total) by mouth daily.     multivitamin tablet  Take 1 tablet by mouth daily. Senior complex     pioglitazone-metformin 15-850 MG tablet  Commonly known as:  ACTOPLUS MET  Take 1 tablet by mouth two  times daily with meals     SAPHRIS 5 MG Subl 24 hr tablet  Generic drug:  asenapine  Place 5 mg under the tongue at bedtime.     simvastatin 40 MG tablet  Commonly known as:  ZOCOR  Take 1 tablet by mouth at  bedtime     traZODone 50 MG tablet  Commonly known as:  DESYREL  Take 150 mg by mouth.           Objective:   Physical Exam BP 118/76 mmHg  Pulse 80  Temp(Src) 97.6 F (36.4 C) (Oral)  Ht 5' 10"  (1.778 m)  Wt 244 lb 8 oz (110.904 kg)  BMI 35.08 kg/m2  SpO2 96% General:   Well developed, well nourished . NAD.  HEENT:  Normocephalic . Face symmetric, atraumatic Lungs:  CTA B Normal respiratory effort, no intercostal retractions, no accessory muscle use. Heart: RRR,  no murmur.  No pretibial edema bilaterally  DIABETIC FEET EXAM: No lower extremity edema Normal pedal pulses bilaterally Skin normal, nails normal, no calluses Pinprick examination of the feet normal.  Neurologic:  alert & oriented X3.  Speech normal, gait appropriate for age and unassisted Psych--  Cognition and judgment appear intact.  Cooperative with normal attention span and concentration.  Behavior appropriate. No anxious or depressed appearing.      Assessment & Plan:   Assessment DM HTN Hyperlipidemia Morbid obesity (BMI 36 plus DM) Bipolar, Depression ------ see psychiatry DJD-- hydrocodone rarely rx by [place] ortho Asthma-Allergies ------------ Dr [name]  GERD, h/o dysphagia (chronic,  neurogenic-transfer dysphagia? See GI note 12-2011) Neuro: --? stroke : saw neuro 2013 d/t B transient visual loss, ASA changed to plavix.  --See CTA, MRIs of head-neck report  --Saw neuro 11-2013: fall, syncope,parkinsonism  d/t sapharis? CV: Enlarged ascending Ao per CT 12-2015, recheck q year  Nl LE arterial dopplers 2015 CT chest  07-2014----RML  3m 01-13-16 RML nodule >>> stable , benign LLL linear scar >> f/u 6 months  Enlarged Ascending Ao 3.9 cm: f/u yearly  HOH H/o Mnire's disease   PLAN: DM: Continue Actos plus met. Check A1c HTN: C/o cough for a few months, could be related to Vasotec. D/C Vasotec 2.5 mg, Flonase consistently, start losartan 25 mg. BMP in 2 weeks. Monitor BPs High cholesterol: On simvastatin, check a FLP Anemia: Mild anemia noted on chart review, check a CBC, iron and ferritin. RTC, CPX 3 months

## 2016-04-29 NOTE — Assessment & Plan Note (Signed)
DM: Continue Actos plus met. Check A1c HTN: C/o cough for a few months, could be related to Vasotec. D/C Vasotec 2.5 mg, Flonase consistently, start losartan 25 mg. BMP in 2 weeks. Monitor BPs High cholesterol: On simvastatin, check a FLP Anemia: Mild anemia noted on chart review, check a CBC, iron and ferritin. RTC, CPX 3 months

## 2016-04-30 DIAGNOSIS — J3089 Other allergic rhinitis: Secondary | ICD-10-CM | POA: Diagnosis not present

## 2016-04-30 DIAGNOSIS — J301 Allergic rhinitis due to pollen: Secondary | ICD-10-CM | POA: Diagnosis not present

## 2016-05-05 DIAGNOSIS — J3089 Other allergic rhinitis: Secondary | ICD-10-CM | POA: Diagnosis not present

## 2016-05-05 DIAGNOSIS — J301 Allergic rhinitis due to pollen: Secondary | ICD-10-CM | POA: Diagnosis not present

## 2016-05-07 DIAGNOSIS — J3089 Other allergic rhinitis: Secondary | ICD-10-CM | POA: Diagnosis not present

## 2016-05-07 DIAGNOSIS — J301 Allergic rhinitis due to pollen: Secondary | ICD-10-CM | POA: Diagnosis not present

## 2016-05-12 DIAGNOSIS — J301 Allergic rhinitis due to pollen: Secondary | ICD-10-CM | POA: Diagnosis not present

## 2016-05-12 DIAGNOSIS — H26492 Other secondary cataract, left eye: Secondary | ICD-10-CM | POA: Diagnosis not present

## 2016-05-12 DIAGNOSIS — J3089 Other allergic rhinitis: Secondary | ICD-10-CM | POA: Diagnosis not present

## 2016-05-12 DIAGNOSIS — E119 Type 2 diabetes mellitus without complications: Secondary | ICD-10-CM | POA: Diagnosis not present

## 2016-05-12 DIAGNOSIS — H35033 Hypertensive retinopathy, bilateral: Secondary | ICD-10-CM | POA: Diagnosis not present

## 2016-05-13 ENCOUNTER — Other Ambulatory Visit (INDEPENDENT_AMBULATORY_CARE_PROVIDER_SITE_OTHER): Payer: Medicare Other

## 2016-05-13 DIAGNOSIS — E785 Hyperlipidemia, unspecified: Secondary | ICD-10-CM

## 2016-05-13 DIAGNOSIS — D649 Anemia, unspecified: Secondary | ICD-10-CM

## 2016-05-13 DIAGNOSIS — I10 Essential (primary) hypertension: Secondary | ICD-10-CM

## 2016-05-13 DIAGNOSIS — Z7289 Other problems related to lifestyle: Secondary | ICD-10-CM | POA: Diagnosis not present

## 2016-05-13 DIAGNOSIS — Z1159 Encounter for screening for other viral diseases: Secondary | ICD-10-CM

## 2016-05-13 DIAGNOSIS — E118 Type 2 diabetes mellitus with unspecified complications: Secondary | ICD-10-CM

## 2016-05-13 LAB — CBC WITH DIFFERENTIAL/PLATELET
Basophils Absolute: 0 10*3/uL (ref 0.0–0.1)
Basophils Relative: 0.5 % (ref 0.0–3.0)
Eosinophils Absolute: 0.2 10*3/uL (ref 0.0–0.7)
Eosinophils Relative: 2.4 % (ref 0.0–5.0)
HCT: 39 % (ref 39.0–52.0)
Hemoglobin: 12.9 g/dL — ABNORMAL LOW (ref 13.0–17.0)
Lymphocytes Relative: 31.2 % (ref 12.0–46.0)
Lymphs Abs: 2.1 10*3/uL (ref 0.7–4.0)
MCHC: 33 g/dL (ref 30.0–36.0)
MCV: 82.9 fl (ref 78.0–100.0)
Monocytes Absolute: 0.5 10*3/uL (ref 0.1–1.0)
Monocytes Relative: 7.1 % (ref 3.0–12.0)
Neutro Abs: 4 10*3/uL (ref 1.4–7.7)
Neutrophils Relative %: 58.8 % (ref 43.0–77.0)
Platelets: 300 10*3/uL (ref 150.0–400.0)
RBC: 4.7 Mil/uL (ref 4.22–5.81)
RDW: 15 % (ref 11.5–15.5)
WBC: 6.9 10*3/uL (ref 4.0–10.5)

## 2016-05-13 LAB — LIPID PANEL
Cholesterol: 114 mg/dL (ref 0–200)
HDL: 42.3 mg/dL (ref >39.00)
LDL Cholesterol: 59 mg/dL (ref 0–99)
NonHDL: 72.18
Total CHOL/HDL Ratio: 3
Triglycerides: 67 mg/dL (ref 0.0–149.0)
VLDL: 13.4 mg/dL (ref 0.0–40.0)

## 2016-05-13 LAB — BASIC METABOLIC PANEL
BUN: 20 mg/dL (ref 6–23)
CO2: 29 mEq/L (ref 19–32)
Calcium: 9.2 mg/dL (ref 8.4–10.5)
Chloride: 105 mEq/L (ref 96–112)
Creatinine, Ser: 0.94 mg/dL (ref 0.40–1.50)
GFR: 84.84 mL/min (ref >60.00)
Glucose, Bld: 129 mg/dL — ABNORMAL HIGH (ref 70–99)
Potassium: 4.4 mEq/L (ref 3.5–5.1)
Sodium: 142 mEq/L (ref 135–145)

## 2016-05-13 LAB — HEMOGLOBIN A1C: Hgb A1c MFr Bld: 6.1 % (ref 4.6–6.5)

## 2016-05-13 LAB — FERRITIN: Ferritin: 18.5 ng/mL — ABNORMAL LOW (ref 22.0–322.0)

## 2016-05-13 LAB — HEPATITIS C ANTIBODY: HCV Ab: NEGATIVE

## 2016-05-13 LAB — IRON: Iron: 48 ug/dL (ref 42–165)

## 2016-05-19 DIAGNOSIS — J3089 Other allergic rhinitis: Secondary | ICD-10-CM | POA: Diagnosis not present

## 2016-05-19 DIAGNOSIS — J301 Allergic rhinitis due to pollen: Secondary | ICD-10-CM | POA: Diagnosis not present

## 2016-05-26 DIAGNOSIS — J301 Allergic rhinitis due to pollen: Secondary | ICD-10-CM | POA: Diagnosis not present

## 2016-05-26 DIAGNOSIS — J3089 Other allergic rhinitis: Secondary | ICD-10-CM | POA: Diagnosis not present

## 2016-06-01 DIAGNOSIS — J301 Allergic rhinitis due to pollen: Secondary | ICD-10-CM | POA: Diagnosis not present

## 2016-06-01 DIAGNOSIS — J3089 Other allergic rhinitis: Secondary | ICD-10-CM | POA: Diagnosis not present

## 2016-06-01 DIAGNOSIS — M25552 Pain in left hip: Secondary | ICD-10-CM | POA: Diagnosis not present

## 2016-06-09 DIAGNOSIS — J3089 Other allergic rhinitis: Secondary | ICD-10-CM | POA: Diagnosis not present

## 2016-06-09 DIAGNOSIS — J301 Allergic rhinitis due to pollen: Secondary | ICD-10-CM | POA: Diagnosis not present

## 2016-06-16 DIAGNOSIS — J301 Allergic rhinitis due to pollen: Secondary | ICD-10-CM | POA: Diagnosis not present

## 2016-06-16 DIAGNOSIS — J3089 Other allergic rhinitis: Secondary | ICD-10-CM | POA: Diagnosis not present

## 2016-06-21 ENCOUNTER — Other Ambulatory Visit: Payer: Self-pay | Admitting: Family Medicine

## 2016-06-24 ENCOUNTER — Ambulatory Visit (INDEPENDENT_AMBULATORY_CARE_PROVIDER_SITE_OTHER): Payer: Medicare Other | Admitting: Internal Medicine

## 2016-06-24 ENCOUNTER — Encounter: Payer: Self-pay | Admitting: Internal Medicine

## 2016-06-24 VITALS — BP 132/76 | HR 76 | Temp 97.6°F | Resp 16 | Ht 70.0 in | Wt 250.2 lb

## 2016-06-24 DIAGNOSIS — J4521 Mild intermittent asthma with (acute) exacerbation: Secondary | ICD-10-CM

## 2016-06-24 DIAGNOSIS — J029 Acute pharyngitis, unspecified: Secondary | ICD-10-CM | POA: Diagnosis not present

## 2016-06-24 LAB — POCT RAPID STREP A (OFFICE): Rapid Strep A Screen: NEGATIVE

## 2016-06-24 MED ORDER — NYSTATIN 100000 UNIT/ML MT SUSP: 5.0000 mL | Freq: Four times a day (QID) | OROMUCOSAL | 0 refills | Status: DC

## 2016-06-24 NOTE — Progress Notes (Signed)
Subjective:    Patient ID: Ryan Zhang, male    DOB: 31-Dec-1945, 70 y.o.   MRN: [id number]  DOS:  06/24/2016 Type of visit - description : Acute visit Interval history:  Symptoms started 4 days ago shortly after he had a large meal: Develop moderate heartburn, started coughing and wheezing on and off. Also having some postnasal dripping. Cough comes in spells, he feels mucus mostly pooling in the throat. Unknown color. Yesterday he had some sore throat and he felt his throat was "on fire". Both symptoms are better today. He already restarted Symbicort.  Review of Systems Denies fever chills No sinus pain, congestion. No chest pain or difficulty breathing Depression not completely well controlled, he already discuss with his psychiatrist, no suicidal ideas  Past Medical History:  Diagnosis Date  . Allergy    allergy shots Dr. Velora Heckler  . Asthma    moderate persistant  . Bipolar affective ([place])   . Diabetes mellitus   . DJD (degenerative joint disease)   . Dysphagia   . Gallstones   . GERD (gastroesophageal reflux disease)   . Hepatitis A    as teenager 89's  . Hollenhorst plaque    right eye  . Hyperlipidemia   . Hypertension   . Kidney stones   . Meniere's disease   . Motility disorder, esophageal   . Obesity   . Pancreatitis   . Personal history of colonic polyps 11/2004   hyperplastic.  . Stroke [facility])    per MRI    Past Surgical History:  Procedure Laterality Date  . CATARACT EXTRACTION Bilateral 09/2015 and 10/2015  . CHOLECYSTECTOMY    . TOTAL KNEE ARTHROPLASTY     both knees    Social History   Social History  . Marital status: Married    Spouse name: N/A  . Number of children: 1  . Years of education: N/A   Occupational History  . disabled Disabled    laboratory computing/programming   Social History Main Topics  . Smoking status: Former Smoker    Quit date: 11/15/1964  . Smokeless tobacco: Never Used  . Alcohol use No  . Drug use: No  .  Sexual activity: Not on file   Other Topics Concern  . Not on file   Social History Narrative   Household-- pt , wife, daughter (h/o substance abuse,on a methadone program, doing better )        Medication List       Accurate as of 06/24/16 11:59 PM. Always use your most recent med list.          benztropine 2 MG tablet Commonly known as:  COGENTIN Take 2 mg by mouth every evening.   betamethasone dipropionate 0.05 % ointment Commonly known as:  DIPROLENE Apply topically 2 (two) times daily as needed.   budesonide-formoterol 160-4.5 MCG/ACT inhaler Commonly known as:  SYMBICORT Inhale 2 puffs into the lungs 2 (two) times daily.   clonazePAM 0.5 MG tablet Commonly known as:  KLONOPIN Take 0.5 mg by mouth 3 (three) times daily.   clopidogrel 75 MG tablet Commonly known as:  PLAVIX TAKE 1 TABLET BY MOUTH ONCE A DAY   CYMBALTA 60 MG capsule Generic drug:  DULoxetine Take 60 mg by mouth every morning.   diclofenac sodium 1 % Gel Commonly known as:  VOLTAREN Apply 2 g topically 4 (four) times daily.   EPIPEN 2-PAK 0.3 mg/0.3 mL Soaj injection Generic drug:  EPINEPHrine Reported on 04/29/2016  esomeprazole 40 MG capsule Commonly known as:  NEXIUM Take 40 mg by mouth every evening.   fexofenadine 180 MG tablet Commonly known as:  ALLEGRA Take 180 mg by mouth every morning.   fluticasone 50 MCG/ACT nasal spray Commonly known as:  FLONASE Place 1 spray into both nostrils daily.   gabapentin 100 MG capsule Commonly known as:  NEURONTIN Take 100 mg by mouth 3 (three) times daily.   gabapentin 400 MG capsule Commonly known as:  NEURONTIN Take 400 mg by mouth 3 (three) times daily.   glucose blood test strip Commonly known as:  ONE TOUCH ULTRA TEST Use one strip each time, glucose levels are tested. Pt tests 1-2 times daily. Dx. E11.9   HYDROcodone-acetaminophen 5-325 MG tablet Commonly known as:  NORCO/VICODIN Take 1 tablet by mouth every 4 (four) hours  as needed for moderate pain or severe pain.   lamoTRIgine 25 MG tablet Commonly known as:  LAMICTAL Take 50 mg by mouth at bedtime.   losartan 25 MG tablet Commonly known as:  COZAAR Take 1 tablet (25 mg total) by mouth daily.   multivitamin tablet Take 1 tablet by mouth daily. Senior complex   nystatin 100000 UNIT/ML suspension Commonly known as:  MYCOSTATIN Take 5 mLs (500,000 Units total) by mouth 4 (four) times daily.   pioglitazone-metformin 15-850 MG tablet Commonly known as:  ACTOPLUS MET Take 1 tablet by mouth 2 (two) times daily with a meal.   SAPHRIS 5 MG Subl 24 hr tablet Generic drug:  asenapine Place 5 mg under the tongue at bedtime.   simvastatin 40 MG tablet Commonly known as:  ZOCOR Take 1 tablet by mouth at  bedtime   traZODone 50 MG tablet Commonly known as:  DESYREL Take 150 mg by mouth.          Objective:   Physical Exam BP 132/76 (BP Location: Left Arm, Patient Position: Sitting, Cuff Size: Normal)   Pulse 76   Temp 97.6 F (36.4 C) (Oral)   Resp 16   Ht 5' 10"  (1.778 m)   Wt 250 lb 4 oz (113.5 kg)   SpO2 96%   BMI 35.91 kg/m  General:   Well developed, well nourished . NAD.  HEENT:  Normocephalic . Face symmetric, atraumatic. Sinuses no TTP. Throat: Red, symmetric, tongue  which few white patches Lungs:  CTA B Normal respiratory effort, no intercostal retractions, no accessory muscle use. Heart: RRR,  no murmur.  No pretibial edema bilaterally  Skin: Not pale. Not jaundice Neurologic:  alert & oriented X3.  Speech normal, gait appropriate for age and unassisted Psych--  Cognition and judgment appear intact.  Cooperative with normal attention span and concentration.  Behavior appropriate. No anxious or depressed appearing.      Assessment & Plan:    Assessment DM HTN Hyperlipidemia Morbid obesity (BMI 36 plus DM) Bipolar, Depression ------ see psychiatry, Pauline Good NP DJD-- hydrocodone rarely rx by [place]  ortho Asthma-Allergies ------------ Dr [name]  GERD, h/o dysphagia (chronic, neurogenic-transfer dysphagia? See GI note 12-2011) Neuro: --? stroke : saw neuro 2013 d/t B transient visual loss, ASA changed to plavix.  --See CTA, MRIs of head-neck report  --Saw neuro 11-2013: fall, syncope,parkinsonism d/t sapharis? CV: Enlarged ascending Ao per CT 12-2015, recheck q year  Nl LE arterial dopplers 2015 CT chest  07-2014----RML  86m 01-13-16 RML nodule >>> stable , benign LLL linear scar >> f/u 6 months  Enlarged Ascending Ao 3.9 cm: f[facility]u yearly  [facility]  [facility] Mnire's [facility]   PLAN: Asthma exacerbation: Presents with mild asthma  exacerbation, related to GERD? On exam, he has also what seems to be thrush. Strep test negative Plan: Continue Symbicort twice a day and rinse the mouth immediately after, albuterol as needed, Mucinex, Flonase. If not better will need antibiotics Will also prescribed nystatin for a question of thrush.  Continue with OTC Nexium 2 qd, take before meals.

## 2016-06-24 NOTE — Patient Instructions (Addendum)
Rest, fluids , tylenol  For cough:  Take Mucinex DM twice a day as needed until better  For nasal congestion: Use OTC Nasocort or Flonase : 2 nasal sprays on each side of the nose in the morning until you feel better   Stay on Symbicort twice a day, use albuterol as needed  Take nystatin for 5 days (swish and spit )  Avoid decongestants such as  Pseudoephedrine or phenylephrine   Call if not gradually better over the next  10 days  Call anytime if the symptoms are severe

## 2016-06-24 NOTE — Progress Notes (Signed)
Pre visit review using our clinic review tool, if applicable. No additional management support is needed unless otherwise documented below in the visit note. 

## 2016-06-25 NOTE — Assessment & Plan Note (Signed)
Asthma exacerbation: Presents with mild asthma  exacerbation, related to GERD? On exam, he has also what seems to be thrush. Strep test negative Plan: Continue Symbicort twice a day and rinse the mouth immediately after, albuterol as needed, Mucinex, Flonase. If not better will need antibiotics Will also prescribed nystatin for a question of thrush.  Continue with OTC Nexium 2 qd, take before meals.

## 2016-06-30 ENCOUNTER — Encounter: Payer: Self-pay | Admitting: Internal Medicine

## 2016-06-30 ENCOUNTER — Other Ambulatory Visit: Payer: Self-pay | Admitting: Internal Medicine

## 2016-06-30 DIAGNOSIS — J3089 Other allergic rhinitis: Secondary | ICD-10-CM | POA: Diagnosis not present

## 2016-06-30 DIAGNOSIS — J301 Allergic rhinitis due to pollen: Secondary | ICD-10-CM | POA: Diagnosis not present

## 2016-06-30 MED ORDER — DOXYCYCLINE HYCLATE 100 MG PO TABS: 100.0000 mg | ORAL_TABLET | Freq: Two times a day (BID) | ORAL | 0 refills | Status: DC

## 2016-07-06 ENCOUNTER — Other Ambulatory Visit: Payer: Self-pay | Admitting: Family Medicine

## 2016-07-07 DIAGNOSIS — J3089 Other allergic rhinitis: Secondary | ICD-10-CM | POA: Diagnosis not present

## 2016-07-07 DIAGNOSIS — J301 Allergic rhinitis due to pollen: Secondary | ICD-10-CM | POA: Diagnosis not present

## 2016-07-14 DIAGNOSIS — J3089 Other allergic rhinitis: Secondary | ICD-10-CM | POA: Diagnosis not present

## 2016-07-14 DIAGNOSIS — J301 Allergic rhinitis due to pollen: Secondary | ICD-10-CM | POA: Diagnosis not present

## 2016-07-23 DIAGNOSIS — J301 Allergic rhinitis due to pollen: Secondary | ICD-10-CM | POA: Diagnosis not present

## 2016-07-23 DIAGNOSIS — T788XXA Other adverse effects, not elsewhere classified, initial encounter: Secondary | ICD-10-CM | POA: Diagnosis not present

## 2016-07-23 DIAGNOSIS — J3089 Other allergic rhinitis: Secondary | ICD-10-CM | POA: Diagnosis not present

## 2016-07-23 DIAGNOSIS — J4540 Moderate persistent asthma, uncomplicated: Secondary | ICD-10-CM | POA: Diagnosis not present

## 2016-07-28 DIAGNOSIS — J301 Allergic rhinitis due to pollen: Secondary | ICD-10-CM | POA: Diagnosis not present

## 2016-07-28 DIAGNOSIS — J3089 Other allergic rhinitis: Secondary | ICD-10-CM | POA: Diagnosis not present

## 2016-08-04 DIAGNOSIS — J3089 Other allergic rhinitis: Secondary | ICD-10-CM | POA: Diagnosis not present

## 2016-08-04 DIAGNOSIS — J301 Allergic rhinitis due to pollen: Secondary | ICD-10-CM | POA: Diagnosis not present

## 2016-08-11 DIAGNOSIS — J3089 Other allergic rhinitis: Secondary | ICD-10-CM | POA: Diagnosis not present

## 2016-08-11 DIAGNOSIS — J301 Allergic rhinitis due to pollen: Secondary | ICD-10-CM | POA: Diagnosis not present

## 2016-08-17 ENCOUNTER — Telehealth: Payer: Self-pay | Admitting: Internal Medicine

## 2016-08-17 DIAGNOSIS — R9389 Abnormal findings on diagnostic imaging of other specified body structures: Secondary | ICD-10-CM

## 2016-08-17 NOTE — Telephone Encounter (Signed)
Please arrange a CT chest without contrast. DX abnormal CT chest

## 2016-08-17 NOTE — Telephone Encounter (Signed)
CT chest ordered.

## 2016-08-19 ENCOUNTER — Ambulatory Visit (HOSPITAL_BASED_OUTPATIENT_CLINIC_OR_DEPARTMENT_OTHER)
Admission: RE | Admit: 2016-08-19 | Discharge: 2016-08-19 | Disposition: A | Discharge status: Home / Self Care | Payer: Medicare Other | Source: Ambulatory Visit | Attending: Internal Medicine | Admitting: Internal Medicine

## 2016-08-19 DIAGNOSIS — R918 Other nonspecific abnormal finding of lung field: Secondary | ICD-10-CM | POA: Insufficient documentation

## 2016-08-19 DIAGNOSIS — R938 Abnormal findings on diagnostic imaging of other specified body structures: Secondary | ICD-10-CM | POA: Diagnosis present

## 2016-08-23 ENCOUNTER — Telehealth: Payer: Self-pay

## 2016-08-23 NOTE — Telephone Encounter (Signed)
Notes Recorded by Wanda PlumpJose E Paz, MD on 08/20/2016 at 1:04 PM EDT Please call the patient, advised that the lung nodule is benign and no more x-rays need to be done on regular basis. Also, there is a question of bronchitis, let me know if he is having fever, chills, cough or a lot of mucus production.   Patient is aware and he stated he is having symptoms of runny nose, sinus drainage, Headache that comes and goes, cough with wheezing. He is using the Symbicort prn. Please advise     KP

## 2016-08-23 NOTE — Telephone Encounter (Signed)
discussed with patient and he verbalized understanding, He will try the OTC med's and if no relief he will call back.   KP

## 2016-08-23 NOTE — Telephone Encounter (Signed)
Recommend OTC Robitussin-DM, Flonase 2 sprays in each side of the nose every morning. If not gradually improving let us know

## 2016-08-24 ENCOUNTER — Encounter: Payer: Self-pay | Admitting: Internal Medicine

## 2016-09-01 DIAGNOSIS — K1379 Other lesions of oral mucosa: Secondary | ICD-10-CM | POA: Diagnosis not present

## 2016-09-01 DIAGNOSIS — J3089 Other allergic rhinitis: Secondary | ICD-10-CM | POA: Diagnosis not present

## 2016-09-01 DIAGNOSIS — J301 Allergic rhinitis due to pollen: Secondary | ICD-10-CM | POA: Diagnosis not present

## 2016-09-02 DIAGNOSIS — H903 Sensorineural hearing loss, bilateral: Secondary | ICD-10-CM | POA: Diagnosis not present

## 2016-09-08 DIAGNOSIS — J3089 Other allergic rhinitis: Secondary | ICD-10-CM | POA: Diagnosis not present

## 2016-09-08 DIAGNOSIS — J301 Allergic rhinitis due to pollen: Secondary | ICD-10-CM | POA: Diagnosis not present

## 2016-09-14 DIAGNOSIS — K1379 Other lesions of oral mucosa: Secondary | ICD-10-CM | POA: Diagnosis not present

## 2016-09-15 DIAGNOSIS — J301 Allergic rhinitis due to pollen: Secondary | ICD-10-CM | POA: Diagnosis not present

## 2016-09-15 DIAGNOSIS — J3089 Other allergic rhinitis: Secondary | ICD-10-CM | POA: Diagnosis not present

## 2016-09-17 DIAGNOSIS — K1379 Other lesions of oral mucosa: Secondary | ICD-10-CM | POA: Diagnosis not present

## 2016-09-20 ENCOUNTER — Other Ambulatory Visit: Payer: Self-pay | Admitting: Internal Medicine

## 2016-09-20 DIAGNOSIS — J301 Allergic rhinitis due to pollen: Secondary | ICD-10-CM | POA: Diagnosis not present

## 2016-09-20 DIAGNOSIS — J3089 Other allergic rhinitis: Secondary | ICD-10-CM | POA: Diagnosis not present

## 2016-09-24 DIAGNOSIS — J3089 Other allergic rhinitis: Secondary | ICD-10-CM | POA: Diagnosis not present

## 2016-09-24 DIAGNOSIS — J301 Allergic rhinitis due to pollen: Secondary | ICD-10-CM | POA: Diagnosis not present

## 2016-09-29 ENCOUNTER — Encounter: Payer: Self-pay | Admitting: Internal Medicine

## 2016-09-29 ENCOUNTER — Ambulatory Visit (INDEPENDENT_AMBULATORY_CARE_PROVIDER_SITE_OTHER): Payer: Medicare Other | Admitting: Internal Medicine

## 2016-09-29 VITALS — BP 124/78 | HR 83 | Temp 98.2°F | Resp 14 | Ht 70.0 in | Wt 255.1 lb

## 2016-09-29 DIAGNOSIS — I10 Essential (primary) hypertension: Secondary | ICD-10-CM

## 2016-09-29 DIAGNOSIS — E1142 Type 2 diabetes mellitus with diabetic polyneuropathy: Secondary | ICD-10-CM | POA: Diagnosis not present

## 2016-09-29 DIAGNOSIS — E785 Hyperlipidemia, unspecified: Secondary | ICD-10-CM | POA: Diagnosis not present

## 2016-09-29 DIAGNOSIS — J301 Allergic rhinitis due to pollen: Secondary | ICD-10-CM | POA: Diagnosis not present

## 2016-09-29 DIAGNOSIS — R399 Unspecified symptoms and signs involving the genitourinary system: Secondary | ICD-10-CM

## 2016-09-29 DIAGNOSIS — E1169 Type 2 diabetes mellitus with other specified complication: Secondary | ICD-10-CM

## 2016-09-29 DIAGNOSIS — Z0000 Encounter for general adult medical examination without abnormal findings: Secondary | ICD-10-CM

## 2016-09-29 DIAGNOSIS — J3089 Other allergic rhinitis: Secondary | ICD-10-CM | POA: Diagnosis not present

## 2016-09-29 LAB — BASIC METABOLIC PANEL
BUN: 25 mg/dL — ABNORMAL HIGH (ref 6–23)
CO2: 29 mEq/L (ref 19–32)
Calcium: 8.9 mg/dL (ref 8.4–10.5)
Chloride: 108 mEq/L (ref 96–112)
Creatinine, Ser: 0.83 mg/dL (ref 0.40–1.50)
GFR: 97.83 mL/min (ref >60.00)
Glucose, Bld: 140 mg/dL — ABNORMAL HIGH (ref 70–99)
Potassium: 4.2 mEq/L (ref 3.5–5.1)
Sodium: 143 mEq/L (ref 135–145)

## 2016-09-29 LAB — URINALYSIS, ROUTINE W REFLEX MICROSCOPIC
Bilirubin Urine: NEGATIVE
Hgb urine dipstick: NEGATIVE
Leukocytes, UA: NEGATIVE
Nitrite: NEGATIVE
Specific Gravity, Urine: >=1.03 — AB (ref 1.000–1.030)
Total Protein, Urine: NEGATIVE
Urine Glucose: NEGATIVE
Urobilinogen, UA: 1 (ref 0.0–1.0)
pH: 6 (ref 5.0–8.0)

## 2016-09-29 LAB — AST: AST: 17 U/L (ref 0–37)

## 2016-09-29 LAB — ALT: ALT: 12 U/L (ref 0–53)

## 2016-09-29 LAB — HEMOGLOBIN A1C: Hgb A1c MFr Bld: 6 % (ref 4.6–6.5)

## 2016-09-29 NOTE — Assessment & Plan Note (Addendum)
Td 2011; pnm shot 2015; prevnar 2016; zostavax 2013; had a flu shot   CCS: Nl Cscope 2011 Prostate ca screening: PSA < 1.0 consistently . DRE today within normal.   Diet-exercise -fall prevention-healthcare power of attorney discussed

## 2016-09-29 NOTE — Progress Notes (Addendum)
Subjective:    Patient ID: Ryan Zhang, male    DOB: Nov 29, 1945, 70 y.o.   MRN: [id number]  DOS:  09/29/2016 Type of visit - description : CPX We also discussed all his chronic medical issues. Good medication compliance.  Review of Systems   Constitutional: No fever. No chills. No unexplained wt changes. No unusual sweats  HEENT: No dental problems, no ear discharge, no facial swelling, no voice changes. No eye discharge, no eye  redness , no  intolerance to light   Respiratory:  Continue with occasional cough but no wheezing, very small amount of sputum production, some mucus pooling in his throat. no  difficulty breathing.   Cardiovascular: No CP, no leg swelling , no  Palpitations  GI: no nausea, no vomiting, no diarrhea , no  abdominal pain.  No blood in the stools. No dysphagia, no odynophagia   Occasional heartburn, last episode last night. Endocrine: No polyphagia, no polyuria , no polydipsia  GU:  Lately has noted more urinary frequency, smaller amounts of urine but denies gross hematuria, dysuria or difficulty with urination .  Musculoskeletal: No joint swellings or unusual aches or pains  Skin: No change in the color of the skin, palor , no  Rash  Allergic, immunologic: Symptoms not completely control, some mucus pooling in the throat.  Neurological: No dizziness no  syncope. No headaches. No diplopia, no slurred, no slurred speech, no motor deficits, no facial  Numbness  Hematological: No enlarged lymph nodes, no easy bruising , no unusual bleedings  Psychiatry: No suicidal ideas, no hallucinations, no beavior problems, no confusion.  No unusual/severe anxiety, no depression   Past Medical History:  Diagnosis Date  . Allergy    allergy shots Dr. Velora Heckler  . Asthma    moderate persistant  . Bipolar affective ([place])   . Diabetes mellitus   . DJD (degenerative joint disease)   . Dysphagia   . Gallstones    hx of, s/p cholecystectomy  . GERD  (gastroesophageal reflux disease)   . Hepatitis A    as teenager 56's  . Hollenhorst plaque    right eye  . Hyperlipidemia   . Hypertension   . Kidney stones    hx of  . Meniere's disease   . Motility disorder, esophageal   . Obesity   . Pancreatitis   . Personal history of colonic polyps 11/2004   hyperplastic.  . Stroke [facility])    per MRI    Past Surgical History:  Procedure Laterality Date  . CATARACT EXTRACTION Bilateral 09/2015 and 10/2015  . CHOLECYSTECTOMY    . TOTAL KNEE ARTHROPLASTY Bilateral    both knees    Social History   Social History  . Marital status: Married    Spouse name: N/A  . Number of children: 1  . Years of education: N/A   Occupational History  . disabled Disabled    laboratory computing/programming   Social History Main Topics  . Smoking status: Former Smoker    Quit date: 11/15/1964  . Smokeless tobacco: Never Used  . Alcohol use No  . Drug use: No  . Sexual activity: Not on file   Other Topics Concern  . Not on file   Social History Narrative   Household-- pt , wife, adopted  daughter (h/o substance abuse,on a methadone program, doing better )     Family History  Problem Relation Age of Onset  . Diabetes Father   . Heart disease Maternal Grandmother   .  Cancer Sister     unknown type  . Colon cancer Neg Hx   . Prostate cancer Neg Hx        Medication List       Accurate as of 09/29/16  1:14 PM. Always use your most recent med list.          benztropine 2 MG tablet Commonly known as:  COGENTIN Take 2 mg by mouth every evening.   budesonide-formoterol 160-4.5 MCG/ACT inhaler Commonly known as:  SYMBICORT Inhale 2 puffs into the lungs 2 (two) times daily.   clonazePAM 0.5 MG tablet Commonly known as:  KLONOPIN Take 0.5 mg by mouth 3 (three) times daily.   clopidogrel 75 MG tablet Commonly known as:  PLAVIX Take 1 tablet (75 mg total) by mouth daily.   CYMBALTA 60 MG capsule Generic drug:  DULoxetine Take  60 mg by mouth every morning.   EPIPEN 2-PAK 0.3 mg/0.3 mL Soaj injection Generic drug:  EPINEPHrine Reported on 04/29/2016   esomeprazole 40 MG capsule Commonly known as:  NEXIUM Take 40 mg by mouth every evening.   fexofenadine 180 MG tablet Commonly known as:  ALLEGRA Take 180 mg by mouth every morning.   gabapentin 100 MG capsule Commonly known as:  NEURONTIN Take 100 mg by mouth 3 (three) times daily.   gabapentin 400 MG capsule Commonly known as:  NEURONTIN Take 400 mg by mouth 3 (three) times daily.   glucose blood test strip Commonly known as:  ONE TOUCH ULTRA TEST Use one strip each time, glucose levels are tested. Pt tests 1-2 times daily. Dx. E11.9   HYDROcodone-acetaminophen 5-325 MG tablet Commonly known as:  NORCO/VICODIN Take 1 tablet by mouth every 4 (four) hours as needed for moderate pain or severe pain.   lamoTRIgine 25 MG tablet Commonly known as:  LAMICTAL Take 50 mg by mouth at bedtime.   losartan 25 MG tablet Commonly known as:  COZAAR Take 1 tablet (25 mg total) by mouth daily.   multivitamin tablet Take 1 tablet by mouth daily. Senior complex   nystatin 100000 UNIT/ML suspension Commonly known as:  MYCOSTATIN Take 5 mLs (500,000 Units total) by mouth 4 (four) times daily.   pioglitazone-metformin 15-850 MG tablet Commonly known as:  ACTOPLUS MET Take 1 tablet by mouth 2 (two) times daily with a meal.   SAPHRIS 5 MG Subl 24 hr tablet Generic drug:  asenapine Place 5 mg under the tongue at bedtime.   simvastatin 40 MG tablet Commonly known as:  ZOCOR Take 1 tablet (40 mg total) by mouth at bedtime.   traZODone 50 MG tablet Commonly known as:  DESYREL Take 150 mg by mouth.   VERAMYST 27.5 MCG/SPRAY nasal spray Generic drug:  fluticasone Place 2 sprays into the nose daily.          Objective:   Physical Exam BP 124/78 (BP Location: Left Arm, Patient Position: Sitting, Cuff Size: Normal)   Pulse 83   Temp 98.2 F (36.8 C)  (Oral)   Resp 14   Ht _0  (1.778 m)   Wt 255 lb 2 oz (115.7 kg)   SpO2 96%   BMI 36.61 kg/m   General:   Well developed, well nourished . NAD.  Neck: No  thyromegaly  HEENT:  Normocephalic . Face symmetric, atraumatic Lungs:  CTA B Normal respiratory effort, no intercostal retractions, no accessory muscle use. Heart: RRR,  no murmur.  No pretibial edema bilaterally  Abdomen:  Not distended, soft, non-tender.  No rebound or rigidity.   Skin: Exposed areas without rash. Not pale. Not jaundice Rectal:  External abnormalities: none. Normal sphincter tone. No rectal masses or tenderness.  Stool brown  Prostate: Prostate gland firm and smooth, no enlargement, nodularity, tenderness, mass, asymmetry or induration.  Neurologic:  alert & oriented X3.  Speech normal, gait appropriate for age and unassisted Strength symmetric and appropriate for age.  Psych: Cognition and judgment appear intact.  Cooperative with normal attention span and concentration.  Behavior appropriate. No anxious or depressed appearing.    Assessment & Plan:   Assessment DM HTN Hyperlipidemia Morbid obesity (BMI 36 plus DM) Bipolar, Depression ------ see psychiatry, Pauline Good NP DJD-- hydrocodone rarely rx by [place] ortho Asthma-Allergies ------------ Dr [name]  GERD, h/o dysphagia (chronic, neurogenic-transfer dysphagia? See GI note 12-2011) Neuro: --? stroke : saw neuro 2013 d/t B transient visual loss, ASA changed to plavix.  --See CTA, MRIs of head-neck report  --Saw neuro 11-2013: fall, syncope,parkinsonism d/t sapharis? CV: Enlarged ascending Ao per CT 12-2015,CT chest 08-2016 stable recheck q year  Nl LE arterial dopplers 2015 CT chest  07-2014----RML  25m 01-13-16  RML nodule >>> stable , benign; LLL linear scar >> f/u 6 months ; Enlarged Ascending Ao 3.9 cm: f/u yearly  08/19/2016: CT chest: Stable. Aorta about the same. HOH H/o Mnire's disease   PLAN: DM: Continue Actos plus met,  check A1c. HTN: Continue losartan. Check a BMP,controlled Hyperlipidemia: Last FLP satisfactory, checking LFTs. Asthma, allergies: Controlled? Has  some postnasal dripping. Recommend to discuss with his allergies, continue multiple medications and allergy shots. GERD: Continue PPIs, still has sporadic sx. Appropriate diet discussed. LUTS: PSAsstable, DRE today wnl, check a UA,UCX, offered trial w/  meds   rtc 6-8 months

## 2016-09-29 NOTE — Progress Notes (Signed)
Pre visit review using our clinic review tool, if applicable. No additional management support is needed unless otherwise documented below in the visit note. 

## 2016-09-29 NOTE — Patient Instructions (Signed)
Get your blood work before you leave    Next visit in 6-8 months  Please discuss your symptoms with your allergist   Food Choices for Gastroesophageal Reflux Disease, Adult When you have gastroesophageal reflux disease (GERD), the foods you eat and your eating habits are very important. Choosing the right foods can help ease the discomfort of GERD. What general guidelines do I need to follow?  Choose fruits, vegetables, whole grains, low-fat dairy products, and low-fat meat, fish, and poultry.  Limit fats such as oils, salad dressings, butter, nuts, and avocado.  Keep a food diary to identify foods that cause symptoms.  Avoid foods that cause reflux. These may be different for different people.  Eat frequent small meals instead of three large meals each day.  Eat your meals slowly, in a relaxed setting.  Limit fried foods.  Cook foods using methods other than frying.  Avoid drinking alcohol.  Avoid drinking large amounts of liquids with your meals.  Avoid bending over or lying down until 2-3 hours after eating. What foods are not recommended? The following are some foods and drinks that may worsen your symptoms: Vegetables  Tomatoes. Tomato juice. Tomato and spaghetti sauce. Chili peppers. Onion and garlic. Horseradish. Fruits  Oranges, grapefruit, and lemon (fruit and juice). Meats  High-fat meats, fish, and poultry. This includes hot dogs, ribs, ham, sausage, salami, and bacon. Dairy  Whole milk and chocolate milk. Sour cream. Cream. Butter. Ice cream. Cream cheese. Beverages  Coffee and tea, with or without caffeine. Carbonated beverages or energy drinks. Condiments  Hot sauce. Barbecue sauce. Sweets/Desserts  Chocolate and cocoa. Donuts. Peppermint and spearmint. Fats and Oils  High-fat foods, including [place] fries and potato chips. Other  Vinegar. Strong spices, such as black pepper, white pepper, red pepper, cayenne, curry powder, cloves, ginger, and chili  powder. The items listed above may not be a complete list of foods and beverages to avoid. Contact your dietitian for more information.  This information is not intended to replace advice given to you by your health care provider. Make sure you discuss any questions you have with your health care provider. Document Released: 11/01/2005 Document Revised: 04/08/2016 Document Reviewed: 09/05/2013 Elsevier Interactive Patient Education  2017 [employer]     Fall Prevention and Home Safety Falls cause injuries and can affect all age groups. It is possible to use preventive measures to significantly decrease the likelihood of falls. There are many simple measures which can make your home safer and prevent falls. OUTDOORS  Repair cracks and edges of walkways and driveways.  Remove high doorway thresholds.  Trim shrubbery on the main path into your home.  Have good outside lighting.  Clear walkways of tools, rocks, debris, and clutter.  Check that handrails are not broken and are securely fastened. Both sides of steps should have handrails.  Have leaves, snow, and ice cleared regularly.  Use sand or salt on walkways during winter months.  In the garage, clean up grease or oil spills. BATHROOM  Install night lights.  Install grab bars by the toilet and in the tub and shower.  Use non-skid mats or decals in the tub or shower.  Place a plastic non-slip stool in the shower to sit on, if needed.  Keep floors dry and clean up all water on the floor immediately.  Remove soap buildup in the tub or shower on a regular basis.  Secure bath mats with non-slip, double-sided rug tape.  Remove throw rugs and tripping hazards  from the floors. BEDROOMS  Install night lights.  Make sure a bedside light is easy to reach.  Do not use oversized bedding.  Keep a telephone by your bedside.  Have a firm chair with side arms to use for getting dressed.  Remove throw rugs and tripping  hazards from the floor. KITCHEN  Keep handles on pots and pans turned toward the center of the stove. Use back burners when possible.  Clean up spills quickly and allow time for drying.  Avoid walking on wet floors.  Avoid hot utensils and knives.  Position shelves so they are not too high or low.  Place commonly used objects within easy reach.  If necessary, use a sturdy step stool with a grab bar when reaching.  Keep electrical cables out of the way.  Do not use floor polish or wax that makes floors slippery. If you must use wax, use non-skid floor wax.  Remove throw rugs and tripping hazards from the floor. STAIRWAYS  Never leave objects on stairs.  Place handrails on both sides of stairways and use them. Fix any loose handrails. Make sure handrails on both sides of the stairways are as long as the stairs.  Check carpeting to make sure it is firmly attached along stairs. Make repairs to worn or loose carpet promptly.  Avoid placing throw rugs at the top or bottom of stairways, or properly secure the rug with carpet tape to prevent slippage. Get rid of throw rugs, if possible.  Have an electrician put in a light switch at the top and bottom of the stairs. OTHER FALL PREVENTION TIPS  Wear low-heel or rubber-soled shoes that are supportive and fit well. Wear closed toe shoes.  When using a stepladder, make sure it is fully opened and both spreaders are firmly locked. Do not climb a closed stepladder.  Add color or contrast paint or tape to grab bars and handrails in your home. Place contrasting color strips on first and last steps.  Learn and use mobility aids as needed. Install an electrical emergency response system.  Turn on lights to avoid dark areas. Replace light bulbs that burn out immediately. Get light switches that glow.  Arrange furniture to create clear pathways. Keep furniture in the same place.  Firmly attach carpet with non-skid or double-sided  tape.  Eliminate uneven floor surfaces.  Select a carpet pattern that does not visually hide the edge of steps.  Be aware of all pets. OTHER HOME SAFETY TIPS  Set the water temperature for 120 F (48.8 C).  Keep emergency numbers on or near the telephone.  Keep smoke detectors on every level of the home and near sleeping areas. Document Released: 10/22/2002 Document Revised: 05/02/2012 Document Reviewed: 01/21/2012 [facility] Patient Information 2015 [place], [place]. This information is not intended to replace advice given to you by your health care provider. Make sure you discuss any questions you have with your health care provider.   Preventive Care for Adults Ages 49 and over  Blood pressure check.** / Every 1 to 2 years.  Lipid and cholesterol check.**/ Every 5 years beginning at age 74.  Lung cancer screening. / Every year if you are aged 55-80 years and have a 30-pack-year history of smoking and currently smoke or have quit within the past 15 years. Yearly screening is stopped once you have quit smoking for at least 15 years or develop a health problem that would prevent you from having lung cancer treatment.  Fecal occult blood test (  FOBT) of stool. / Every year beginning at age 68 and continuing until age 68. You may not have to do this test if you get a colonoscopy every 10 years.  Flexible sigmoidoscopy** or colonoscopy.** / Every 5 years for a flexible sigmoidoscopy or every 10 years for a colonoscopy beginning at age 68 and continuing until age 68.  Hepatitis C blood test.** / For all people born from [id number] through 1963 and any individual with known risks for hepatitis C.  Abdominal aortic aneurysm (AAA) screening.** / A one-time screening for ages 1965 to 7675 years who are current or former smokers.  Skin self-exam. / Monthly.  Influenza vaccine. / Every year.  Tetanus, diphtheria, and acellular pertussis (Tdap/Td) vaccine.** / 1 dose of Td every 10 years.  Varicella  vaccine.** / Consult your health care provider.  Zoster vaccine.** / 1 dose for adults aged 68 years or older.  Pneumococcal 13-valent conjugate (PCV13) vaccine.** / Consult your health care provider.  Pneumococcal polysaccharide (PPSV23) vaccine.** / 1 dose for all adults aged 68 years and older.  Meningococcal vaccine.** / Consult your health care provider.  Hepatitis A vaccine.** / Consult your health care provider.  Hepatitis B vaccine.** / Consult your health care provider.  Haemophilus influenzae type b (Hib) vaccine.** / Consult your health care provider. **Family history and personal history of risk and conditions may change your health care provider's recommendations. Document Released: 12/28/2001 Document Revised: 11/06/2013 Document Reviewed: 03/29/2011 [facility] Patient Information 2015 [employer] [place]. This information is not intended to replace advice given to you by your health care provider. Make sure you discuss any questions you have with your health care provider.

## 2016-09-29 NOTE — Assessment & Plan Note (Signed)
DM: Continue Actos plus met, check A1c. HTN: Continue losartan. Check a BMP,controlled Hyperlipidemia: Last FLP satisfactory, checking LFTs. Asthma, allergies: Controlled? Has  some postnasal dripping. Recommend to discuss with his allergies, continue multiple medications and allergy shots. GERD: Continue PPIs, still has sporadic sx. Appropriate diet discussed. LUTS: PSAsstable, DRE today wnl, check a UA,UCX, offered trial w/  meds   rtc 6-8 months

## 2016-09-30 LAB — URINE CULTURE: Organism ID, Bacteria: NO GROWTH

## 2016-09-30 MED ORDER — ACCU-CHEK GUIDE W/DEVICE KIT: 1.0000 | PACK | 0 refills | Status: DC

## 2016-09-30 MED ORDER — GLUCOSE BLOOD VI STRP: ORAL_STRIP | 12 refills | Status: DC

## 2016-09-30 NOTE — Telephone Encounter (Signed)
Accu-chek kit and test strips sent to [facility].

## 2016-10-04 DIAGNOSIS — J3089 Other allergic rhinitis: Secondary | ICD-10-CM | POA: Diagnosis not present

## 2016-10-04 DIAGNOSIS — J301 Allergic rhinitis due to pollen: Secondary | ICD-10-CM | POA: Diagnosis not present

## 2016-10-15 DIAGNOSIS — J3089 Other allergic rhinitis: Secondary | ICD-10-CM | POA: Diagnosis not present

## 2016-10-15 DIAGNOSIS — J301 Allergic rhinitis due to pollen: Secondary | ICD-10-CM | POA: Diagnosis not present

## 2016-10-18 DIAGNOSIS — J3089 Other allergic rhinitis: Secondary | ICD-10-CM | POA: Diagnosis not present

## 2016-10-18 DIAGNOSIS — J301 Allergic rhinitis due to pollen: Secondary | ICD-10-CM | POA: Diagnosis not present

## 2016-10-27 DIAGNOSIS — J3089 Other allergic rhinitis: Secondary | ICD-10-CM | POA: Diagnosis not present

## 2016-10-27 DIAGNOSIS — J301 Allergic rhinitis due to pollen: Secondary | ICD-10-CM | POA: Diagnosis not present

## 2016-11-03 DIAGNOSIS — J3089 Other allergic rhinitis: Secondary | ICD-10-CM | POA: Diagnosis not present

## 2016-11-03 DIAGNOSIS — J301 Allergic rhinitis due to pollen: Secondary | ICD-10-CM | POA: Diagnosis not present

## 2016-11-10 DIAGNOSIS — J301 Allergic rhinitis due to pollen: Secondary | ICD-10-CM | POA: Diagnosis not present

## 2016-11-10 DIAGNOSIS — J3089 Other allergic rhinitis: Secondary | ICD-10-CM | POA: Diagnosis not present

## 2016-11-19 DIAGNOSIS — J301 Allergic rhinitis due to pollen: Secondary | ICD-10-CM | POA: Diagnosis not present

## 2016-11-19 DIAGNOSIS — J3089 Other allergic rhinitis: Secondary | ICD-10-CM | POA: Diagnosis not present

## 2016-11-24 DIAGNOSIS — J3089 Other allergic rhinitis: Secondary | ICD-10-CM | POA: Diagnosis not present

## 2016-11-24 DIAGNOSIS — J301 Allergic rhinitis due to pollen: Secondary | ICD-10-CM | POA: Diagnosis not present

## 2016-11-27 ENCOUNTER — Encounter: Payer: Self-pay | Admitting: Internal Medicine

## 2016-11-30 DIAGNOSIS — F3175 Bipolar disorder, in partial remission, most recent episode depressed: Secondary | ICD-10-CM | POA: Diagnosis not present

## 2016-12-03 DIAGNOSIS — J3089 Other allergic rhinitis: Secondary | ICD-10-CM | POA: Diagnosis not present

## 2016-12-03 DIAGNOSIS — J301 Allergic rhinitis due to pollen: Secondary | ICD-10-CM | POA: Diagnosis not present

## 2016-12-08 ENCOUNTER — Encounter: Payer: Self-pay | Admitting: Internal Medicine

## 2016-12-08 DIAGNOSIS — J301 Allergic rhinitis due to pollen: Secondary | ICD-10-CM | POA: Diagnosis not present

## 2016-12-08 DIAGNOSIS — J3089 Other allergic rhinitis: Secondary | ICD-10-CM | POA: Diagnosis not present

## 2016-12-09 MED ORDER — ONETOUCH ULTRASOFT LANCETS MISC: 12 refills | Status: AC

## 2016-12-09 MED ORDER — GLUCOSE BLOOD VI STRP: ORAL_STRIP | 12 refills | Status: DC

## 2016-12-09 MED ORDER — ONETOUCH VERIO FLEX SYSTEM W/DEVICE KIT: 1.0000 | PACK | Freq: Once | 0 refills | Status: AC

## 2016-12-13 ENCOUNTER — Other Ambulatory Visit: Payer: Self-pay | Admitting: Internal Medicine

## 2016-12-14 DIAGNOSIS — M19012 Primary osteoarthritis, left shoulder: Secondary | ICD-10-CM | POA: Diagnosis not present

## 2016-12-15 ENCOUNTER — Ambulatory Visit (INDEPENDENT_AMBULATORY_CARE_PROVIDER_SITE_OTHER): Payer: PPO | Admitting: Internal Medicine

## 2016-12-15 ENCOUNTER — Encounter: Payer: Self-pay | Admitting: Internal Medicine

## 2016-12-15 VITALS — BP 122/74 | HR 76 | Temp 97.6°F | Resp 14 | Ht 70.0 in | Wt 253.1 lb

## 2016-12-15 DIAGNOSIS — S80812A Abrasion, left lower leg, initial encounter: Secondary | ICD-10-CM

## 2016-12-15 DIAGNOSIS — J069 Acute upper respiratory infection, unspecified: Secondary | ICD-10-CM

## 2016-12-15 DIAGNOSIS — L03116 Cellulitis of left lower limb: Secondary | ICD-10-CM | POA: Diagnosis not present

## 2016-12-15 DIAGNOSIS — Z23 Encounter for immunization: Secondary | ICD-10-CM

## 2016-12-15 MED ORDER — DOXYCYCLINE HYCLATE 100 MG PO TABS
100.0000 mg | ORAL_TABLET | Freq: Two times a day (BID) | ORAL | 0 refills | Status: DC
Start: 2016-12-15 — End: 2017-01-05

## 2016-12-15 NOTE — Progress Notes (Signed)
Pre visit review using our clinic review tool, if applicable. No additional management support is needed unless otherwise documented below in the visit note. 

## 2016-12-15 NOTE — Assessment & Plan Note (Addendum)
Left leg injury: Has a hematoma and now seems to be developing cellulitis. Recommend leg elevation, doxycycline, recheck in 2-3 weeks. Call anytime if he is not improving gradually, has fever, chills or increased swelling. Cellulitis: As above URI: At this point he has only sore throat, no fever or chills. Recommend conservative treatment. RTC 2 or 3 weeks for a checkup

## 2016-12-15 NOTE — Progress Notes (Signed)
Subjective:    Patient ID: Ryan Zhang, male    DOB: 12/12/1945, 71 y.o.   MRN: [id number]  DOS:  12/15/2016 Type of visit - description : Acute visit Interval history:  Had a fall 2 weeks ago, he was clearing  snow. There was no syncope or other major injuries, he scraped his left leg. The area was quite swollen and tender. The swelling started to go down (has a hematoma on the mid pretibial area) however the pretibial area also  started to get slightly warm and red ~ 6 days ago. He is concerned about cellulitis. Also, 2 days ago developed a sore throat, thinks he is coming down with a cold.   Review of Systems Denies fever or chills The calf per se is not swollen or tender Denies cough, nausea, vomiting. No myalgias.(no flu sx)   Past Medical History:  Diagnosis Date  . Allergy    allergy shots Dr. Velora Heckler  . Asthma    moderate persistant  . Bipolar affective ([place])   . Diabetes mellitus   . DJD (degenerative joint disease)   . Dysphagia   . Gallstones    hx of, s/p cholecystectomy  . GERD (gastroesophageal reflux disease)   . Hepatitis A    as teenager 65's  . Hollenhorst plaque    right eye  . Hyperlipidemia   . Hypertension   . Kidney stones    hx of  . Meniere's disease   . Motility disorder, esophageal   . Obesity   . Pancreatitis   . Personal history of colonic polyps 11/2004   hyperplastic.  . Stroke [facility])    per MRI    Past Surgical History:  Procedure Laterality Date  . CATARACT EXTRACTION Bilateral 09/2015 and 10/2015  . CHOLECYSTECTOMY    . TOTAL KNEE ARTHROPLASTY Bilateral    both knees    Social History   Social History  . Marital status: Married    Spouse name: N/A  . Number of children: 1  . Years of education: N/A   Occupational History  . disabled Disabled    laboratory computing/programming   Social History Main Topics  . Smoking status: Former Smoker    Quit date: 11/15/1964  . Smokeless tobacco: Never Used  . Alcohol use  No  . Drug use: No  . Sexual activity: Not on file   Other Topics Concern  . Not on file   Social History Narrative   Household-- pt , wife, adopted  daughter (h/o substance abuse,on a methadone program, doing better )      Allergies as of 12/15/2016      Reactions   Citalopram Hydrobromide    insomnia   Divalproex Sodium    Pancreatitis    Methocarbamol    hallucinations   Other    "Symbrax" blood sugar   Paroxetine    insomnia   Percocet [oxycodone-acetaminophen]    Reversed response   Risperidone    REACTION: hyper   Smz-tmp Ds [sulfamethoxazole-trimethoprim] Nausea And Vomiting   Wellbutrin [bupropion]    Hot flashes      Medication List       Accurate as of 12/15/16  6:51 PM. Always use your most recent med list.          benztropine 2 MG tablet Commonly known as:  COGENTIN Take 2 mg by mouth every evening.   budesonide-formoterol 160-4.5 MCG/ACT inhaler Commonly known as:  SYMBICORT Inhale 2 puffs into the lungs 2 (two)  times daily.   clonazePAM 0.5 MG tablet Commonly known as:  KLONOPIN Take 0.5 mg by mouth 3 (three) times daily.   clopidogrel 75 MG tablet Commonly known as:  PLAVIX Take 1 tablet (75 mg total) by mouth daily.   CYMBALTA 60 MG capsule Generic drug:  DULoxetine Take 60 mg by mouth every morning.   doxycycline 100 MG tablet Commonly known as:  VIBRA-TABS Take 1 tablet (100 mg total) by mouth 2 (two) times daily.   EPIPEN 2-PAK 0.3 mg/0.3 mL Soaj injection Generic drug:  EPINEPHrine Reported on 04/29/2016   esomeprazole 40 MG capsule Commonly known as:  NEXIUM Take 40 mg by mouth every evening.   fexofenadine 180 MG tablet Commonly known as:  ALLEGRA Take 180 mg by mouth every morning.   gabapentin 100 MG capsule Commonly known as:  NEURONTIN Take 100 mg by mouth 3 (three) times daily.   gabapentin 400 MG capsule Commonly known as:  NEURONTIN Take 400 mg by mouth 3 (three) times daily.   glucose blood test  strip Commonly known as:  ONETOUCH VERIO Check blood sugars no more than twice daily   HYDROcodone-acetaminophen 5-325 MG tablet Commonly known as:  NORCO/VICODIN Take 1 tablet by mouth every 4 (four) hours as needed for moderate pain or severe pain.   lamoTRIgine 25 MG tablet Commonly known as:  LAMICTAL Take 50 mg by mouth at bedtime.   losartan 25 MG tablet Commonly known as:  COZAAR Take 1 tablet (25 mg total) by mouth daily.   multivitamin tablet Take 1 tablet by mouth daily. Senior complex   nystatin 100000 UNIT/ML suspension Commonly known as:  MYCOSTATIN Take 5 mLs (500,000 Units total) by mouth 4 (four) times daily.   onetouch ultrasoft lancets Check blood sugars no more than twice daily   pioglitazone-metformin 15-850 MG tablet Commonly known as:  ACTOPLUS MET Take 1 tablet by mouth 2 (two) times daily with a meal.   SAPHRIS 5 MG Subl 24 hr tablet Generic drug:  asenapine Place 5 mg under the tongue at bedtime.   simvastatin 40 MG tablet Commonly known as:  ZOCOR Take 1 tablet (40 mg total) by mouth at bedtime.   traZODone 50 MG tablet Commonly known as:  DESYREL Take 150 mg by mouth.   VERAMYST 27.5 MCG/SPRAY nasal spray Generic drug:  fluticasone Place 2 sprays into the nose daily.          Objective:   Physical Exam BP 122/74 (BP Location: Left Arm, Patient Position: Sitting, Cuff Size: Normal)   Pulse 76   Temp 97.6 F (36.4 C) (Oral)   Resp 14   Ht 5' 10"  (1.778 m)   Wt 253 lb 2 oz (114.8 kg)   SpO2 97%   BMI 36.32 kg/m  General:   Well developed, well nourished . NAD.  HEENT:  Normocephalic . Face symmetric, atraumatic no smoke congested, throat without redness. Symmetric Lungs:  CTA B Normal respiratory effort, no intercostal retractions, no accessory muscle use. Heart: RRR,  no murmur.  No pretibial edema bilaterally  Lower extremities Right leg normal Left leg: at the mid pretibial area has a hematoma, some redness consistent  with cellulitis. See picture. Good pedal pulses.  Calves are measured, they are symmetric in circumference. Neurologic:  alert & oriented X3.  Speech normal, gait appropriate for age and unassisted Psych--  Cognition and judgment appear intact.  Cooperative with normal attention span and concentration.  Behavior appropriate. No anxious or depressed appearing.  Assessment & Plan:   Assessment DM HTN Hyperlipidemia Morbid obesity (BMI 36 plus DM) Bipolar, Depression ------ see psychiatry, Pauline Good NP DJD-- hydrocodone rarely rx by [place] ortho Asthma-Allergies ------------ Dr [name]  GERD, h/o dysphagia (chronic, neurogenic-transfer dysphagia? See GI note 12-2011) Neuro: --? stroke : saw neuro 2013 d/t B transient visual loss, ASA changed to plavix.  --See CTA, MRIs of head-neck report  --Saw neuro 11-2013: fall, syncope,parkinsonism d/t sapharis? CV: Enlarged ascending Ao per CT 12-2015,CT chest 08-2016 stable recheck q year  Nl LE arterial dopplers 2015 CT chest  07-2014----RML  73m 01-13-16  RML nodule >>> stable , benign; LLL linear scar >> f/u 6 months ; Enlarged Ascending Ao 3.9 cm: f/u yearly  08/19/2016: CT chest: Stable. Aorta about the same. HOH H/o Mnire's disease   PLAN: Left leg injury: Has a hematoma and now seems to be developing cellulitis. Recommend leg elevation, doxycycline, recheck in 2-3 weeks. Call anytime if he is not improving gradually, has fever, chills or increased swelling. Cellulitis: As above URI: At this point he has only sore throat, no fever or chills. Recommend conservative treatment. RTC 2 or 3 weeks for a checkup

## 2016-12-15 NOTE — Patient Instructions (Signed)
Please come back in 2 or 3 weeks for a checkup   Keep the leg elevated as much as possible  Take the antibiotic for 10 days  Call anytime if you have fever, chills, increasing swelling in your leg. Or if you are not improving gradually  ==== For the cold, rest, drink plenty of fluids, use Tylenol as needed and get Robitussin-DM. Call if you have severe symptoms

## 2016-12-17 DIAGNOSIS — J4540 Moderate persistent asthma, uncomplicated: Secondary | ICD-10-CM | POA: Diagnosis not present

## 2016-12-17 DIAGNOSIS — J3089 Other allergic rhinitis: Secondary | ICD-10-CM | POA: Diagnosis not present

## 2016-12-21 ENCOUNTER — Telehealth: Payer: Self-pay | Admitting: Internal Medicine

## 2016-12-21 NOTE — Telephone Encounter (Signed)
Attempted to call patient to schedule awv. Patient did not answer. Will attempt to call patient at a later date.

## 2016-12-23 ENCOUNTER — Other Ambulatory Visit: Payer: Self-pay | Admitting: Allergy and Immunology

## 2016-12-23 ENCOUNTER — Ambulatory Visit
Admission: RE | Admit: 2016-12-23 | Discharge: 2016-12-23 | Disposition: A | Discharge status: Home / Self Care | Payer: PPO | Source: Ambulatory Visit | Attending: Allergy and Immunology | Admitting: Allergy and Immunology

## 2016-12-23 DIAGNOSIS — J4540 Moderate persistent asthma, uncomplicated: Secondary | ICD-10-CM

## 2016-12-23 DIAGNOSIS — R05 Cough: Secondary | ICD-10-CM | POA: Diagnosis not present

## 2016-12-28 DIAGNOSIS — F3175 Bipolar disorder, in partial remission, most recent episode depressed: Secondary | ICD-10-CM | POA: Diagnosis not present

## 2017-01-05 ENCOUNTER — Encounter: Payer: Self-pay | Admitting: Internal Medicine

## 2017-01-05 ENCOUNTER — Ambulatory Visit (INDEPENDENT_AMBULATORY_CARE_PROVIDER_SITE_OTHER): Payer: PPO | Admitting: Internal Medicine

## 2017-01-05 VITALS — BP 128/72 | HR 95 | Temp 97.8°F | Resp 14 | Ht 70.0 in | Wt 245.1 lb

## 2017-01-05 DIAGNOSIS — L03116 Cellulitis of left lower limb: Secondary | ICD-10-CM

## 2017-01-05 DIAGNOSIS — J3089 Other allergic rhinitis: Secondary | ICD-10-CM | POA: Diagnosis not present

## 2017-01-05 DIAGNOSIS — J301 Allergic rhinitis due to pollen: Secondary | ICD-10-CM | POA: Diagnosis not present

## 2017-01-05 NOTE — Progress Notes (Signed)
Subjective:    Patient ID: Ryan Zhang, male    DOB: Oct 20, 1946, 71 y.o.   MRN: [id number]  DOS:  01/05/2017 Type of visit - description : Follow-up from previous visit Interval history:  cellulitis, improving, status post antibiotics Had a URI, wheezing, his allergist prescribed prednisone, a chest x-ray was done, no pneumonia. Currently better, cough back to baseline, still some hoarseness.  Review of Systems No fever chills  Past Medical History:  Diagnosis Date  . Allergy    allergy shots Dr. Velora Heckler  . Asthma    moderate persistant  . Bipolar affective ([place])   . Diabetes mellitus   . DJD (degenerative joint disease)   . Dysphagia   . Gallstones    hx of, s/p cholecystectomy  . GERD (gastroesophageal reflux disease)   . Hepatitis A    as teenager 54's  . Hollenhorst plaque    right eye  . Hyperlipidemia   . Hypertension   . Kidney stones    hx of  . Meniere's disease   . Motility disorder, esophageal   . Obesity   . Pancreatitis   . Personal history of colonic polyps 11/2004   hyperplastic.  . Stroke [facility])    Ryan MRI    Past Surgical History:  Procedure Laterality Date  . CATARACT EXTRACTION Bilateral 09/2015 and 10/2015  . CHOLECYSTECTOMY    . TOTAL KNEE ARTHROPLASTY Bilateral    both knees    Social History   Social History  . Marital status: Married    Spouse name: N/A  . Number of children: 1  . Years of education: N/A   Occupational History  . disabled Disabled    laboratory computing/programming   Social History Main Topics  . Smoking status: Former Smoker    Quit date: 11/15/1964  . Smokeless tobacco: Never Used  . Alcohol use No  . Drug use: No  . Sexual activity: Not on file   Other Topics Concern  . Not on file   Social History Narrative   Household-- pt , wife, adopted  daughter (h/o substance abuse,on a methadone program, doing better )      Allergies as of 01/05/2017      Reactions   Citalopram Hydrobromide    insomnia   Divalproex Sodium    Pancreatitis    Methocarbamol    hallucinations   Other    "Symbrax" blood sugar   Paroxetine    insomnia   Percocet [oxycodone-acetaminophen]    Reversed response   Risperidone    REACTION: hyper   Smz-tmp Ds [sulfamethoxazole-trimethoprim] Nausea And Vomiting   Wellbutrin [bupropion]    Hot flashes      Medication List       Accurate as of 01/05/17 11:59 PM. Always use your most recent med list.          benztropine 2 MG tablet Commonly known as:  COGENTIN Take 2 mg by mouth every evening.   budesonide-formoterol 160-4.5 MCG/ACT inhaler Commonly known as:  SYMBICORT Inhale 2 puffs into the lungs 2 (two) times daily.   clonazePAM 0.5 MG tablet Commonly known as:  KLONOPIN Take 0.5 mg by mouth 3 (three) times daily.   clopidogrel 75 MG tablet Commonly known as:  PLAVIX Take 1 tablet (75 mg total) by mouth daily.   CYMBALTA 60 MG capsule Generic drug:  DULoxetine Take 60 mg by mouth every morning.   EPIPEN 2-PAK 0.3 mg/0.3 mL Soaj injection Generic drug:  EPINEPHrine Reported on  04/29/2016   esomeprazole 40 MG capsule Commonly known as:  NEXIUM Take 40 mg by mouth every evening.   fexofenadine 180 MG tablet Commonly known as:  ALLEGRA Take 180 mg by mouth every morning.   gabapentin 100 MG capsule Commonly known as:  NEURONTIN Take 100 mg by mouth 3 (three) times daily.   gabapentin 400 MG capsule Commonly known as:  NEURONTIN Take 400 mg by mouth 3 (three) times daily.   glucose blood test strip Commonly known as:  ONETOUCH VERIO Check blood sugars no more than twice daily   HYDROcodone-acetaminophen 5-325 MG tablet Commonly known as:  NORCO/VICODIN Take 1 tablet by mouth every 4 (four) hours as needed for moderate pain or severe pain.   lamoTRIgine 25 MG tablet Commonly known as:  LAMICTAL Take 50 mg by mouth at bedtime.   losartan 25 MG tablet Commonly known as:  COZAAR Take 1 tablet (25 mg total) by mouth  daily.   multivitamin tablet Take 1 tablet by mouth daily. Senior complex   nystatin 100000 UNIT/ML suspension Commonly known as:  MYCOSTATIN Take 5 mLs (500,000 Units total) by mouth 4 (four) times daily.   onetouch ultrasoft lancets Check blood sugars no more than twice daily   pioglitazone-metformin 15-850 MG tablet Commonly known as:  ACTOPLUS MET Take 1 tablet by mouth 2 (two) times daily with a meal.   SAPHRIS 5 MG Subl 24 hr tablet Generic drug:  asenapine Place 5 mg under the tongue at bedtime.   simvastatin 40 MG tablet Commonly known as:  ZOCOR Take 1 tablet (40 mg total) by mouth at bedtime.   traZODone 50 MG tablet Commonly known as:  DESYREL Take 150 mg by mouth.   VERAMYST 27.5 MCG/SPRAY nasal spray Generic drug:  fluticasone Place 2 sprays into the nose daily.          Objective:   Physical Exam BP 128/72 (BP Location: Left Arm, Patient Position: Sitting, Cuff Size: Normal)   Pulse 95   Temp 97.8 F (36.6 C) (Oral)   Resp 14   Ht 5' 10"  (1.778 m)   Wt 245 lb 2 oz (111.2 kg)   SpO2 94%   BMI 35.17 kg/m  General:   Well developed, well nourished . NAD.  HEENT:  Normocephalic . Face symmetric, atraumatic Lower extremities: Calves symmetric and not tender. excoriations and redness at the left pretibial area: Better, now has some hyperpigmentation but no redness or warmness. No TTP. Excoriations seems to be healing well. Neurologic:  alert & oriented X3.  Speech normal, gait appropriate for age and unassisted Psych--  Cognition and judgment appear intact.  Cooperative with normal attention span and concentration.  Behavior appropriate. No anxious or depressed appearing.      Assessment & Plan:   Assessment DM HTN Hyperlipidemia Morbid obesity (BMI 36 plus DM) Bipolar, Depression ------ see psychiatry, Pauline Good NP DJD-- hydrocodone rarely rx by [place] ortho Asthma-Allergies ------------ Dr [name]  GERD, h/o dysphagia (chronic,  neurogenic-transfer dysphagia? See GI note 12-2011) Neuro: --? stroke : saw neuro 2013 d/t B transient visual loss, ASA changed to plavix.  --See CTA, MRIs of head-neck report  --Saw neuro 11-2013: fall, syncope,parkinsonism d/t sapharis? CV: Enlarged ascending Ao Ryan CT 12-2015,CT chest 08-2016 stable recheck q year  Nl LE arterial dopplers 2015 CT chest  07-2014----RML  61m 01-13-16  RML nodule >>> stable , benign; LLL linear scar >> f/u 6 months ; Enlarged Ascending Ao 3.9 cm: f/u yearly  08/19/2016: CT chest: Stable. Aorta about the same. HOH H/o Mnire's disease   PLAN: Left leg injury-cellulitis: Improving, status post antibiotic, recommend observation  URI: Since the last visit, he got worse, his allergist treat him with prednisone and had a chest x-ray which was negative, back to baseline

## 2017-01-05 NOTE — Patient Instructions (Signed)
Please call if you don't continue improving  Please come back in may as planned

## 2017-01-05 NOTE — Progress Notes (Signed)
Pre visit review using our clinic review tool, if applicable. No additional management support is needed unless otherwise documented below in the visit note. 

## 2017-01-06 NOTE — Assessment & Plan Note (Signed)
Left leg injury-cellulitis: Improving, status post antibiotic, recommend observation  URI: Since the last visit, he got worse, his allergist treat him with prednisone and had a chest x-ray which was negative, back to baseline

## 2017-01-11 DIAGNOSIS — F3131 Bipolar disorder, current episode depressed, mild: Secondary | ICD-10-CM | POA: Diagnosis not present

## 2017-01-12 DIAGNOSIS — J3089 Other allergic rhinitis: Secondary | ICD-10-CM | POA: Diagnosis not present

## 2017-01-12 DIAGNOSIS — J301 Allergic rhinitis due to pollen: Secondary | ICD-10-CM | POA: Diagnosis not present

## 2017-01-19 DIAGNOSIS — J301 Allergic rhinitis due to pollen: Secondary | ICD-10-CM | POA: Diagnosis not present

## 2017-01-19 DIAGNOSIS — J3089 Other allergic rhinitis: Secondary | ICD-10-CM | POA: Diagnosis not present

## 2017-01-21 DIAGNOSIS — J3089 Other allergic rhinitis: Secondary | ICD-10-CM | POA: Diagnosis not present

## 2017-01-21 DIAGNOSIS — J4540 Moderate persistent asthma, uncomplicated: Secondary | ICD-10-CM | POA: Diagnosis not present

## 2017-01-25 DIAGNOSIS — F3175 Bipolar disorder, in partial remission, most recent episode depressed: Secondary | ICD-10-CM | POA: Diagnosis not present

## 2017-01-26 DIAGNOSIS — J301 Allergic rhinitis due to pollen: Secondary | ICD-10-CM | POA: Diagnosis not present

## 2017-01-26 DIAGNOSIS — J3089 Other allergic rhinitis: Secondary | ICD-10-CM | POA: Diagnosis not present

## 2017-02-02 DIAGNOSIS — J301 Allergic rhinitis due to pollen: Secondary | ICD-10-CM | POA: Diagnosis not present

## 2017-02-02 DIAGNOSIS — J3089 Other allergic rhinitis: Secondary | ICD-10-CM | POA: Diagnosis not present

## 2017-02-08 NOTE — Progress Notes (Signed)
Pre visit review using our clinic review tool, if applicable. No additional management support is needed unless otherwise documented below in the visit note. 

## 2017-02-08 NOTE — Progress Notes (Addendum)
Subjective:   Ryan Zhang is a 71 y.o. male who presents for Medicare Annual/Subsequent preventive examination.  Diabetes-he is checking CBG at home a few times weekly-averages 120-130.  He reports he has 'very severe' memory problems due to ECT in the 1990s. MMSE today is 30/30. He recently got the first Shingrix vaccine at his pharmacy.  Review of Systems:  No ROS.  Medicare Wellness Visit.  Cardiac Risk Factors include: advanced age (>42mn, >>39women);male gender;hypertension;diabetes mellitus;dyslipidemia;sedentary lifestyle;obesity (BMI >30kg/m2)  Sleep patterns: feels rested on waking, does not get up to void and sleeps 7 hours nightly. Uses trazodone rarely-feels it gives him a 'trazodone hangover' the next day.   Home Safety/Smoke Alarms: Feels safe in home. Smoke alarms in place. Carbon monoxide detectors in place.   Living environment; residence and Firearm Safety: Lives w/ wife and daughter. 2-story house, no firearms. Has a cane and walker at home, but does not use them. They are hoping to move into an apartment soon.  Counseling:   Eye Exam- Dr. DBing Plumeyearly. Next appointment in October. Dental- Dr[place].  Male:   CCS- last 09/30/10. Scattered diverticula, otherwise normal. 10 year recall.   PSA-  Lab Results  Component Value Date   PSA 0.96 09/25/2015   PSA 0.52 04/04/2014   PSA 0.92 02/01/2013        Objective:    Vitals: BP 134/84   Pulse 77   Ht 5' 10"  (1.778 m)   Wt 252 lb 12.8 oz (114.7 kg)   SpO2 97%   BMI 36.27 kg/m   Body mass index is 36.27 kg/m.  Tobacco History  Smoking Status  . Former Smoker  . Quit date: 11/15/1964  Smokeless Tobacco  . Never Used     Counseling given: Not Answered   Past Medical History:  Diagnosis Date  . Allergy    allergy shots Dr. LVelora Heckler . Asthma    moderate persistant  . Bipolar affective ([place]   . Diabetes mellitus   . DJD  (degenerative joint disease)   . Dysphagia   . Gallstones    hx of, s/p cholecystectomy  . GERD (gastroesophageal reflux disease)   . Hepatitis A    as teenager 89's . Hollenhorst plaque    right eye  . Hyperlipidemia   . Hypertension   . Kidney stones    hx of  . Meniere's disease   . Motility disorder, esophageal   . Obesity   . Pancreatitis   . Personal history of colonic polyps 11/2004   hyperplastic.  . Stroke ([facility]    per MRI   Past Surgical History:  Procedure Laterality Date  . CATARACT EXTRACTION Bilateral 09/2015 and 10/2015  . CHOLECYSTECTOMY    . TOTAL KNEE ARTHROPLASTY Bilateral    both knees   Family History  Problem Relation Age of Onset  . Diabetes Father   . Heart disease Brother   . Heart disease Maternal Grandmother   . Cancer Sister     unknown type  . Colon cancer Neg Hx   . Prostate cancer Neg Hx    History  Sexual Activity  . Sexual activity: No    Outpatient Encounter Prescriptions as of 02/09/2017  Medication Sig  . asenapine (SAPHRIS) 5 MG SUBL Place 5 mg under the tongue at bedtime.   . benztropine (COGENTIN) 2 MG tablet Take 2 mg by mouth every evening.   . budesonide-formoterol (  SYMBICORT) 160-4.5 MCG/ACT inhaler Inhale 2 puffs into the lungs 2 (two) times daily.   . clonazePAM (KLONOPIN) 0.5 MG tablet Take 0.5 mg by mouth 3 (three) times daily. 1 mg in the morning and 0.5 mg in the evening.  . clopidogrel (PLAVIX) 75 MG tablet Take 1 tablet (75 mg total) by mouth daily.  . DULoxetine (CYMBALTA) 60 MG capsule Take 60 mg by mouth every morning.   [name] EPIPEN 2-PAK 0.3 MG/0.3ML SOAJ injection Reported on 04/29/2016  . esomeprazole (NEXIUM) 40 MG capsule Take 40 mg by mouth daily.   . fexofenadine (ALLEGRA) 180 MG tablet Take 180 mg by mouth every morning.   . fluticasone (VERAMYST) 27.5 MCG/SPRAY nasal spray Place 2 sprays into the nose daily as needed.   . gabapentin (NEURONTIN) 100 MG capsule Take 100 mg by mouth 3 (three) times daily.     [name] gabapentin (NEURONTIN) 400 MG capsule Take 400 mg by mouth 3 (three) times daily.   [name] glucose blood (ONETOUCH VERIO) test strip Check blood sugars no more than twice daily  . lamoTRIgine (LAMICTAL) 25 MG tablet Take 50 mg by mouth at bedtime.  . Lancets (ONETOUCH ULTRASOFT) lancets Check blood sugars no more than twice daily  . losartan (COZAAR) 25 MG tablet Take 1 tablet (25 mg total) by mouth daily.  . Multiple Vitamin (MULTIVITAMIN) tablet Take 1 tablet by mouth daily. Senior complex  . pioglitazone-metformin (ACTOPLUS MET) 15-850 MG tablet Take 1 tablet by mouth 2 (two) times daily with a meal.  . simvastatin (ZOCOR) 40 MG tablet Take 1 tablet (40 mg total) by mouth at bedtime.  . traZODone (DESYREL) 50 MG tablet Take 150 mg by mouth at bedtime as needed for sleep.   [name] HYDROcodone-acetaminophen (NORCO/VICODIN) 5-325 MG per tablet Take 1 tablet by mouth every 4 (four) hours as needed for moderate pain or severe pain. (Patient not taking: Reported on 02/09/2017)  . [DISCONTINUED] nystatin (MYCOSTATIN) 100000 UNIT/ML suspension Take 5 mLs (500,000 Units total) by mouth 4 (four) times daily.   No facility-administered encounter medications on file as of 02/09/2017.     Activities of Daily Living In your present state of health, do you have any difficulty performing the following activities: 02/09/2017 09/29/2016  Hearing? Y N  Vision? N N  Difficulty concentrating or making decisions? N N  Walking or climbing stairs? Y N  Dressing or bathing? N N  Doing errands, shopping? N N  Preparing Food and eating ? N -  Using the Toilet? N -  In the past six months, have you accidently leaked urine? N -  Do you have problems with loss of bowel control? Y -  Managing your Medications? N -  Managing your Finances? N -  Housekeeping or managing your Housekeeping? N -  Some recent data might be hidden    Patient Care Team: Colon Branch, MD as PCP - General (Internal Medicine) Ulyses Southward,  MD as Consulting Physician (Allergy and Immunology) Norma Fredrickson, MD as Consulting Physician (Psychiatry) Calvert Cantor, MD as Consulting Physician (Ophthalmology) Gean Birchwood, DPM as Consulting Physician (Podiatry)   Assessment:    Physical assessment deferred to PCP.  Exercise Activities and Dietary recommendations Current Exercise Habits: Structured exercise class, Type of exercise: Other - see comments (Silver Sneakers; stationary bike), Frequency (Times/Week): 2  Diet (meal preparation, eat out, water intake, caffeinated beverages, dairy products, fruits and vegetables): in general, an "unhealthy" diet, on average, 1-2 meals per day. Tends to  eat once daily in the evenings. Meals vary from salads to prepared microwavable meals. Drinks diet soda throughout the day. Patient reports he "grazes" at night but does not snack during the day. Does not like the taste of water, so prefers soda.  Goals    . Increase physical activity          Decrease the number of breaks needed when mowing the lawn.    . Prevent Falls      Fall Risk Fall Risk  02/09/2017 09/29/2016 04/29/2016 09/25/2015 05/27/2015  Falls in the past year? Yes No No No Yes  Number falls in past yr: 2 or more - - - 2 or more  Injury with Fall? - - - - No  Risk Factor Category  - - - - -  Risk for fall due to : Impaired balance/gait;History of fall(s) - - - Other (Comment)  Risk for fall due to (comments): - - - - falling while going upstairs  Follow up Falls prevention discussed - - - -   Depression Screen PHQ 2/9 Scores 02/09/2017 09/29/2016 04/29/2016 09/25/2015  PHQ - 2 Score 1 - 0 0  Exception Documentation - Other- indicate reason in comment box - -  Not completed - Pt seen by psychiatry - -    Cognitive Function MMSE - Mini Mental State Exam 02/09/2017  Orientation to time 5  Orientation to Place 5  Registration 3  Attention/ Calculation 5  Recall 3  Language- name 2 objects 2  Language- repeat 1    Language- follow 3 step command 3  Language- read & follow direction 1  Write a sentence 1  Copy design 1  Total score 30        Immunization History  Administered Date(s) Administered  . H1N1 11/14/2008  . Influenza Split 08/12/2011, 08/03/2012  . Influenza Whole 08/22/2009, 08/17/2010  . Influenza,inj,Quad PF,36+ Mos 09/25/2013, 10/14/2014, 09/25/2015  . Influenza-Unspecified 08/04/2016  . Pneumococcal Conjugate-13 02/12/2015  . Pneumococcal Polysaccharide-23 11/15/2008, 12/27/2013  . Td 08/22/2009, 07/20/2010, 12/15/2016  . Zoster 04/20/2012   Screening Tests Health Maintenance  Topic Date Due  . OPHTHALMOLOGY EXAM  06/24/2016  . HEMOGLOBIN A1C  03/29/2017  . FOOT EXAM  04/29/2017  . COLONOSCOPY  09/30/2020  . TETANUS/TDAP  12/15/2026  . INFLUENZA VACCINE  Completed  . Hepatitis C Screening  Completed  . PNA vac Low Risk Adult  Completed      Plan:   Follow-up w/ PCP as scheduled.   Bring a copy of your advance directives to your next office visit.  Encouraged use of cane to prevent falls and increased water intake-patient states he will think about it.  During the course of the visit the patient was educated and counseled about the following appropriate screening and preventive services:   Vaccines to include Pneumoccal, Influenza, Td, HCV  Cardiovascular Disease  Colorectal cancer screening  Diabetes screening  Prostate Cancer Screening  Glaucoma screening  Nutrition counseling   Patient Instructions (the written plan) was given to the patient.    Dorrene German, RN  02/09/2017 Kathlene November, MD

## 2017-02-09 ENCOUNTER — Ambulatory Visit (INDEPENDENT_AMBULATORY_CARE_PROVIDER_SITE_OTHER): Payer: PPO | Admitting: *Deleted

## 2017-02-09 ENCOUNTER — Encounter: Payer: Self-pay | Admitting: *Deleted

## 2017-02-09 VITALS — BP 134/84 | HR 77 | Ht 70.0 in | Wt 252.8 lb

## 2017-02-09 DIAGNOSIS — Z0000 Encounter for general adult medical examination without abnormal findings: Secondary | ICD-10-CM | POA: Diagnosis not present

## 2017-02-09 NOTE — Patient Instructions (Addendum)
Mr. Ryan Zhang , Thank you for taking time to come for your Medicare Wellness Visit. I appreciate your ongoing commitment to your health goals. Please review the following plan we discussed and let me know if I can assist you in the future.   Bring a copy of your advance directives to your next office visit. Please consider using your cane to help your balance and prevent falls.  These are the goals we discussed: Goals    . Increase physical activity          Decrease the number of breaks needed when mowing the lawn.    . Prevent Falls       This is a list of the screening recommended for you and due dates:  Health Maintenance  Topic Date Due  . Eye exam for diabetics  06/24/2016  . Hemoglobin A1C  03/29/2017  . Complete foot exam   04/29/2017  . Colon Cancer Screening  09/30/2020  . Tetanus Vaccine  12/15/2026  . Flu Shot  Completed  .  Hepatitis C: One time screening is recommended by Center for Disease Control  (CDC) for  adults born from [birth year] through 1963.   Completed  . Pneumonia vaccines  Completed

## 2017-02-10 ENCOUNTER — Encounter: Payer: Self-pay | Admitting: *Deleted

## 2017-02-10 ENCOUNTER — Other Ambulatory Visit: Payer: Self-pay | Admitting: *Deleted

## 2017-02-10 DIAGNOSIS — I739 Peripheral vascular disease, unspecified: Principal | ICD-10-CM

## 2017-02-10 DIAGNOSIS — I779 Disorder of arteries and arterioles, unspecified: Secondary | ICD-10-CM

## 2017-02-12 ENCOUNTER — Ambulatory Visit (HOSPITAL_BASED_OUTPATIENT_CLINIC_OR_DEPARTMENT_OTHER)
Admission: RE | Admit: 2017-02-12 | Discharge: 2017-02-12 | Disposition: A | Discharge status: Home / Self Care | Payer: PPO | Source: Ambulatory Visit | Attending: Internal Medicine | Admitting: Internal Medicine

## 2017-02-12 DIAGNOSIS — I6523 Occlusion and stenosis of bilateral carotid arteries: Secondary | ICD-10-CM | POA: Insufficient documentation

## 2017-02-12 DIAGNOSIS — I779 Disorder of arteries and arterioles, unspecified: Secondary | ICD-10-CM | POA: Diagnosis not present

## 2017-02-12 DIAGNOSIS — I739 Peripheral vascular disease, unspecified: Secondary | ICD-10-CM

## 2017-02-16 DIAGNOSIS — J3089 Other allergic rhinitis: Secondary | ICD-10-CM | POA: Diagnosis not present

## 2017-02-16 DIAGNOSIS — J301 Allergic rhinitis due to pollen: Secondary | ICD-10-CM | POA: Diagnosis not present

## 2017-02-22 DIAGNOSIS — F3175 Bipolar disorder, in partial remission, most recent episode depressed: Secondary | ICD-10-CM | POA: Diagnosis not present

## 2017-02-22 DIAGNOSIS — F3131 Bipolar disorder, current episode depressed, mild: Secondary | ICD-10-CM | POA: Diagnosis not present

## 2017-02-23 DIAGNOSIS — J3089 Other allergic rhinitis: Secondary | ICD-10-CM | POA: Diagnosis not present

## 2017-02-23 DIAGNOSIS — J301 Allergic rhinitis due to pollen: Secondary | ICD-10-CM | POA: Diagnosis not present

## 2017-03-02 DIAGNOSIS — J301 Allergic rhinitis due to pollen: Secondary | ICD-10-CM | POA: Diagnosis not present

## 2017-03-02 DIAGNOSIS — J3089 Other allergic rhinitis: Secondary | ICD-10-CM | POA: Diagnosis not present

## 2017-03-16 DIAGNOSIS — J301 Allergic rhinitis due to pollen: Secondary | ICD-10-CM | POA: Diagnosis not present

## 2017-03-16 DIAGNOSIS — J3089 Other allergic rhinitis: Secondary | ICD-10-CM | POA: Diagnosis not present

## 2017-03-20 ENCOUNTER — Other Ambulatory Visit: Payer: Self-pay | Admitting: Internal Medicine

## 2017-03-23 DIAGNOSIS — J3089 Other allergic rhinitis: Secondary | ICD-10-CM | POA: Diagnosis not present

## 2017-03-23 DIAGNOSIS — J301 Allergic rhinitis due to pollen: Secondary | ICD-10-CM | POA: Diagnosis not present

## 2017-03-26 ENCOUNTER — Other Ambulatory Visit: Payer: Self-pay | Admitting: Internal Medicine

## 2017-03-29 ENCOUNTER — Ambulatory Visit: Payer: Medicare Other | Admitting: Internal Medicine

## 2017-03-30 DIAGNOSIS — J301 Allergic rhinitis due to pollen: Secondary | ICD-10-CM | POA: Diagnosis not present

## 2017-03-30 DIAGNOSIS — F3175 Bipolar disorder, in partial remission, most recent episode depressed: Secondary | ICD-10-CM | POA: Diagnosis not present

## 2017-03-30 DIAGNOSIS — J3089 Other allergic rhinitis: Secondary | ICD-10-CM | POA: Diagnosis not present

## 2017-03-31 DIAGNOSIS — J301 Allergic rhinitis due to pollen: Secondary | ICD-10-CM | POA: Diagnosis not present

## 2017-03-31 DIAGNOSIS — J3089 Other allergic rhinitis: Secondary | ICD-10-CM | POA: Diagnosis not present

## 2017-04-04 DIAGNOSIS — F3131 Bipolar disorder, current episode depressed, mild: Secondary | ICD-10-CM | POA: Diagnosis not present

## 2017-04-08 ENCOUNTER — Encounter: Payer: Self-pay | Admitting: Internal Medicine

## 2017-04-08 ENCOUNTER — Ambulatory Visit (INDEPENDENT_AMBULATORY_CARE_PROVIDER_SITE_OTHER): Payer: PPO | Admitting: Internal Medicine

## 2017-04-08 VITALS — BP 122/76 | HR 92 | Temp 98.2°F | Resp 14 | Ht 70.0 in | Wt 244.1 lb

## 2017-04-08 DIAGNOSIS — E1142 Type 2 diabetes mellitus with diabetic polyneuropathy: Secondary | ICD-10-CM

## 2017-04-08 DIAGNOSIS — I10 Essential (primary) hypertension: Secondary | ICD-10-CM | POA: Diagnosis not present

## 2017-04-08 LAB — BASIC METABOLIC PANEL
BUN: 24 mg/dL — ABNORMAL HIGH (ref 6–23)
CO2: 26 mEq/L (ref 19–32)
Calcium: 9.1 mg/dL (ref 8.4–10.5)
Chloride: 104 mEq/L (ref 96–112)
Creatinine, Ser: 1.01 mg/dL (ref 0.40–1.50)
GFR: 77.88 mL/min (ref >60.00)
Glucose, Bld: 126 mg/dL — ABNORMAL HIGH (ref 70–99)
Potassium: 3.9 mEq/L (ref 3.5–5.1)
Sodium: 141 mEq/L (ref 135–145)

## 2017-04-08 LAB — HEMOGLOBIN A1C: Hgb A1c MFr Bld: 6.8 % — ABNORMAL HIGH (ref 4.6–6.5)

## 2017-04-08 NOTE — Progress Notes (Signed)
Subjective:    Patient ID: Ryan Zhang, male    DOB: 18-Dec-1945, 71 y.o.   MRN: [id number]  DOS:  04/08/2017 Type of visit - description : rov Interval history: In general feeling well except that 6 days ago developed achiness, nausea, vomiting and diarrhea. The following days he only had nausea and mild diarrhea. Overall today for the first time is asymptomatic. Did not have fever, chills, abdominal pain or blood in the stools. DM: Good medication compliance, CBGs ranged from 120 to 130. HTN: Well-controlled today, could not tell me any readings but  When BP is checked is normal   Review of Systems   Past Medical History:  Diagnosis Date  . Allergy    allergy shots Dr. Velora Heckler  . Asthma    moderate persistant  . Bipolar affective ([place])   . Diabetes mellitus   . DJD (degenerative joint disease)   . Dysphagia   . Gallstones    hx of, s/p cholecystectomy  . GERD (gastroesophageal reflux disease)   . Hepatitis A    as teenager 38's  . Hollenhorst plaque    right eye  . Hyperlipidemia   . Hypertension   . Kidney stones    hx of  . Meniere's disease   . Motility disorder, esophageal   . Obesity   . Pancreatitis   . Personal history of colonic polyps 11/2004   hyperplastic.  . Stroke [facility])    per MRI    Past Surgical History:  Procedure Laterality Date  . CATARACT EXTRACTION Bilateral 09/2015 and 10/2015  . CHOLECYSTECTOMY    . TOTAL KNEE ARTHROPLASTY Bilateral    both knees    Social History   Social History  . Marital status: Married    Spouse name: N/A  . Number of children: 1  . Years of education: N/A   Occupational History  . disabled Disabled    laboratory computing/programming   Social History Main Topics  . Smoking status: Former Smoker    Quit date: 11/15/1964  . Smokeless tobacco: Never Used  . Alcohol use No  . Drug use: No  . Sexual activity: No   Other Topics Concern  . Not on file   Social History Narrative   Household-- pt ,  wife, adopted  daughter (h/o substance abuse,on a methadone program, doing better )      Allergies as of 04/08/2017      Reactions   Smz-tmp Ds [sulfamethoxazole-trimethoprim] Nausea And Vomiting   Divalproex Sodium Other (See Comments)   Pancreatitis    Methocarbamol Other (See Comments)   hallucinations   Other    "Symbrax" alteration in blood sugar, pt unsure if low or high blood sugar   Paroxetine Other (See Comments)   Insomnia   Percocet [oxycodone-acetaminophen] Other (See Comments)   Hyperactivity   Risperidone Other (See Comments)   REACTION: hyper   Wellbutrin [bupropion] Other (See Comments)   Hot flashes   Citalopram Hydrobromide Other (See Comments)   Insomnia      Medication List       Accurate as of 04/08/17 11:59 PM. Always use your most recent med list.          budesonide-formoterol 160-4.5 MCG/ACT inhaler Commonly known as:  SYMBICORT Inhale 2 puffs into the lungs 2 (two) times daily.   clonazePAM 0.5 MG tablet Commonly known as:  KLONOPIN Take 0.5 mg by mouth 3 (three) times daily. 1 mg in the morning and 0.5 mg in the  evening.   clopidogrel 75 MG tablet Commonly known as:  PLAVIX Take 1 tablet (75 mg total) by mouth daily.   CYMBALTA 60 MG capsule Generic drug:  DULoxetine Take 60 mg by mouth every morning.   EPIPEN 2-PAK 0.3 mg/0.3 mL Soaj injection Generic drug:  EPINEPHrine Reported on 04/29/2016   esomeprazole 40 MG capsule Commonly known as:  NEXIUM Take 40 mg by mouth daily.   fexofenadine 180 MG tablet Commonly known as:  ALLEGRA Take 180 mg by mouth every morning.   gabapentin 300 MG capsule Commonly known as:  NEURONTIN Take 300 mg by mouth at bedtime.   glucose blood test strip Commonly known as:  ONETOUCH VERIO Check blood sugars no more than twice daily   HYDROcodone-acetaminophen 5-325 MG tablet Commonly known as:  NORCO/VICODIN Take 1 tablet by mouth every 4 (four) hours as needed for moderate pain or severe  pain.   lamoTRIgine 25 MG tablet Commonly known as:  LAMICTAL Take 50 mg by mouth at bedtime.   losartan 25 MG tablet Commonly known as:  COZAAR Take 1 tablet (25 mg total) by mouth daily.   multivitamin tablet Take 1 tablet by mouth daily. Senior complex   Conservation officer, nature Check blood sugars no more than twice daily   pioglitazone-metformin 15-850 MG tablet Commonly known as:  ACTOPLUS MET Take 1 tablet by mouth 2 (two) times daily with a meal.   SAPHRIS 5 MG Subl 24 hr tablet Generic drug:  asenapine Place 5 mg under the tongue at bedtime.   simvastatin 40 MG tablet Commonly known as:  ZOCOR Take 1 tablet (40 mg total) by mouth at bedtime.   traZODone 50 MG tablet Commonly known as:  DESYREL Take 150 mg by mouth at bedtime as needed for sleep.   VERAMYST 27.5 MCG/SPRAY nasal spray Generic drug:  fluticasone Place 2 sprays into the nose daily as needed.          Objective:   Physical Exam BP 122/76 (BP Location: Left Arm, Patient Position: Sitting, Cuff Size: Normal)   Pulse 92   Temp 98.2 F (36.8 C) (Oral)   Resp 14   Ht _0  (1.778 m)   Wt 244 lb 2 oz (110.7 kg)   SpO2 97%   BMI 35.03 kg/m  General:   Well developed, well nourished . NAD.  HEENT:  Normocephalic . Face symmetric, atraumatic Lungs:  CTA B Normal respiratory effort, no intercostal retractions, no accessory muscle use. Heart: RRR,  no murmur.  No pretibial edema bilaterally  Skin: Not pale. Not jaundice Neurologic:  alert & oriented X3.  Speech normal, gait appropriate for age and unassisted Psych--  Cognition and judgment appear intact.  Cooperative with normal attention span and concentration.  Behavior appropriate. No anxious or depressed appearing.      Assessment & Plan:   Assessment DM HTN Hyperlipidemia Morbid obesity (BMI 36 plus DM) Bipolar, Depression ------ see psychiatry, Dr Casimiro Needle DJD-- hydrocodone rarely rx by [place] ortho Asthma-Allergies  ------------ Dr [name]  GERD, h/o dysphagia (chronic, neurogenic-transfer dysphagia? See GI note 12-2011) Neuro: --? stroke : saw neuro 2013 d/t B transient visual loss, ASA changed to plavix.  --See CTA, MRIs of head-neck report  --Saw neuro 11-2013: fall, syncope,parkinsonism d/t sapharis? CV: Enlarged ascending Ao per CT 12-2015,CT chest 08-2016 stable recheck q year  Nl LE arterial dopplers 2015 CT chest  07-2014----RML  28m 01-13-16  RML nodule >>> stable , benign; LLL linear scar >> f/u  6 months ; Enlarged Ascending Ao 3.9 cm: f/u yearly  08/19/2016: CT chest: Stable. Aorta about the same. HOH H/o Mnire's disease   PLAN: Gastroenteritis: See above,  now resolved. DM: Continue Actos plus met, check A1c HTN: Continue losartan, check a BMP. Bipolar: Now under the care of Dr. Casimiro Needle, medications have changed slightly, list  adjusted. Primary care: S/P shingles shot #1  03/2017 @ pharmacy RTC 09-2017, CPX

## 2017-04-08 NOTE — Patient Instructions (Signed)
GO TO THE LAB : Get the blood work     GO TO THE FRONT DESK Schedule your next appointment for a physical exam 09-2017, fasting

## 2017-04-08 NOTE — Progress Notes (Signed)
Pre visit review using our clinic review tool, if applicable. No additional management support is needed unless otherwise documented below in the visit note. 

## 2017-04-10 NOTE — Assessment & Plan Note (Signed)
Gastroenteritis: See above,  now resolved. DM: Continue Actos plus met, check A1c HTN: Continue losartan, check a BMP. Bipolar: Now under the care of Dr. Casimiro Needle, medications have changed slightly, list  adjusted. Primary care: S/P shingles shot #1  03/2017 @ pharmacy RTC 09-2017, CPX

## 2017-04-13 DIAGNOSIS — J3089 Other allergic rhinitis: Secondary | ICD-10-CM | POA: Diagnosis not present

## 2017-04-13 DIAGNOSIS — J301 Allergic rhinitis due to pollen: Secondary | ICD-10-CM | POA: Diagnosis not present

## 2017-04-17 ENCOUNTER — Other Ambulatory Visit: Payer: Self-pay | Admitting: Internal Medicine

## 2017-04-20 DIAGNOSIS — J301 Allergic rhinitis due to pollen: Secondary | ICD-10-CM | POA: Diagnosis not present

## 2017-04-20 DIAGNOSIS — J3089 Other allergic rhinitis: Secondary | ICD-10-CM | POA: Diagnosis not present

## 2017-04-25 DIAGNOSIS — J3089 Other allergic rhinitis: Secondary | ICD-10-CM | POA: Diagnosis not present

## 2017-04-25 DIAGNOSIS — J301 Allergic rhinitis due to pollen: Secondary | ICD-10-CM | POA: Diagnosis not present

## 2017-04-28 DIAGNOSIS — F3175 Bipolar disorder, in partial remission, most recent episode depressed: Secondary | ICD-10-CM | POA: Diagnosis not present

## 2017-04-29 DIAGNOSIS — J301 Allergic rhinitis due to pollen: Secondary | ICD-10-CM | POA: Diagnosis not present

## 2017-04-29 DIAGNOSIS — J3089 Other allergic rhinitis: Secondary | ICD-10-CM | POA: Diagnosis not present

## 2017-05-03 DIAGNOSIS — J3089 Other allergic rhinitis: Secondary | ICD-10-CM | POA: Diagnosis not present

## 2017-05-03 DIAGNOSIS — J301 Allergic rhinitis due to pollen: Secondary | ICD-10-CM | POA: Diagnosis not present

## 2017-05-05 DIAGNOSIS — J3089 Other allergic rhinitis: Secondary | ICD-10-CM | POA: Diagnosis not present

## 2017-05-05 DIAGNOSIS — J301 Allergic rhinitis due to pollen: Secondary | ICD-10-CM | POA: Diagnosis not present

## 2017-05-09 ENCOUNTER — Encounter: Payer: Self-pay | Admitting: Internal Medicine

## 2017-05-09 ENCOUNTER — Ambulatory Visit (INDEPENDENT_AMBULATORY_CARE_PROVIDER_SITE_OTHER): Payer: PPO | Admitting: Internal Medicine

## 2017-05-09 VITALS — BP 128/78 | HR 79 | Temp 97.6°F | Resp 14 | Ht 70.0 in | Wt 237.2 lb

## 2017-05-09 DIAGNOSIS — J029 Acute pharyngitis, unspecified: Secondary | ICD-10-CM

## 2017-05-09 DIAGNOSIS — R197 Diarrhea, unspecified: Secondary | ICD-10-CM

## 2017-05-09 DIAGNOSIS — R399 Unspecified symptoms and signs involving the genitourinary system: Secondary | ICD-10-CM

## 2017-05-09 LAB — URINALYSIS, ROUTINE W REFLEX MICROSCOPIC
Bilirubin Urine: NEGATIVE
Hgb urine dipstick: NEGATIVE
Ketones, ur: NEGATIVE
Nitrite: NEGATIVE
Specific Gravity, Urine: 1.02 (ref 1.000–1.030)
Total Protein, Urine: NEGATIVE
Urine Glucose: NEGATIVE
Urobilinogen, UA: 0.2 (ref 0.0–1.0)
pH: 5.5 (ref 5.0–8.0)

## 2017-05-09 LAB — CBC WITH DIFFERENTIAL/PLATELET
Basophils Absolute: 0.1 10*3/uL (ref 0.0–0.1)
Basophils Relative: 0.6 % (ref 0.0–3.0)
Eosinophils Absolute: 0.3 10*3/uL (ref 0.0–0.7)
Eosinophils Relative: 3 % (ref 0.0–5.0)
HCT: 41.1 % (ref 39.0–52.0)
Hemoglobin: 13.3 g/dL (ref 13.0–17.0)
Lymphocytes Relative: 34.1 % (ref 12.0–46.0)
Lymphs Abs: 2.9 10*3/uL (ref 0.7–4.0)
MCHC: 32.4 g/dL (ref 30.0–36.0)
MCV: 81.5 fl (ref 78.0–100.0)
Monocytes Absolute: 0.6 10*3/uL (ref 0.1–1.0)
Monocytes Relative: 7.1 % (ref 3.0–12.0)
Neutro Abs: 4.8 10*3/uL (ref 1.4–7.7)
Neutrophils Relative %: 55.2 % (ref 43.0–77.0)
Platelets: 276 10*3/uL (ref 150.0–400.0)
RBC: 5.04 Mil/uL (ref 4.22–5.81)
RDW: 17 % — ABNORMAL HIGH (ref 11.5–15.5)
WBC: 8.6 10*3/uL (ref 4.0–10.5)

## 2017-05-09 LAB — HEPATIC FUNCTION PANEL
ALT: 15 U/L (ref 0–53)
AST: 23 U/L (ref 0–37)
Albumin: 4.1 g/dL (ref 3.5–5.2)
Alkaline Phosphatase: 68 U/L (ref 39–117)
Bilirubin, Direct: 0.2 mg/dL (ref 0.0–0.3)
Total Bilirubin: 1.1 mg/dL (ref 0.2–1.2)
Total Protein: 7 g/dL (ref 6.0–8.3)

## 2017-05-09 LAB — TSH: TSH: 1.09 u[IU]/mL (ref 0.35–4.50)

## 2017-05-09 LAB — PSA: PSA: 1 ng/mL (ref 0.10–4.00)

## 2017-05-09 NOTE — Progress Notes (Signed)
Subjective:    Patient ID: Ryan Zhang, male    DOB: 10-26-1946, 71 y.o.   MRN: [id number]  DOS:  05/09/2017 Type of visit - description : acute Interval history: Multiple symptoms  Reports episodes of diarrhea, once per week, for the last 3-4 weeks. Stools are watery, nonbloody, not associated with nausea or vomiting. No abdominal pain. + Decrease appetite.  Sore throat, all the time for the last several weeks, increase with swallowing water. Sometimes causing cough. Admits to postnasal dripping but other allergy symptoms (itchy eyes, sneezing) are well-controlled. No classic heartburn type of symptoms.  Increase urination: He c/o similar symptoms during the physical exam few months ago, DRE was okay, urine culture negative. Symptoms are gradually worse. Reports frequent urination with abundant urine, normal in color. Denies hematuria, difficulty urinating or dysuria. CBGs and in the 120s. Took a OTC prostate medication with no help.   Review of Systems No fever chills. Feels cold sometimes. Depression is okay No chest pain, difficulty breathing or leg swelling.  Past Medical History:  Diagnosis Date  . Allergy    allergy shots Dr. Velora Heckler  . Asthma    moderate persistant  . Bipolar affective ([place])   . Diabetes mellitus   . DJD (degenerative joint disease)   . Dysphagia   . Gallstones    hx of, s/p cholecystectomy  . GERD (gastroesophageal reflux disease)   . Hepatitis A    as teenager 46's  . Hollenhorst plaque    right eye  . Hyperlipidemia   . Hypertension   . Kidney stones    hx of  . Meniere's disease   . Motility disorder, esophageal   . Obesity   . Pancreatitis   . Personal history of colonic polyps 11/2004   hyperplastic.  . Stroke [facility])    per MRI    Past Surgical History:  Procedure Laterality Date  . CATARACT EXTRACTION Bilateral 09/2015 and 10/2015  . CHOLECYSTECTOMY    . TOTAL KNEE ARTHROPLASTY Bilateral    both knees    Social History    Social History  . Marital status: Married    Spouse name: N/A  . Number of children: 1  . Years of education: N/A   Occupational History  . disabled Disabled    laboratory computing/programming   Social History Main Topics  . Smoking status: Former Smoker    Quit date: 11/15/1964  . Smokeless tobacco: Never Used  . Alcohol use No  . Drug use: No  . Sexual activity: No   Other Topics Concern  . Not on file   Social History Narrative   Household-- pt , wife, adopted  daughter (h/o substance abuse,on a methadone program, doing better )      Allergies as of 05/09/2017      Reactions   Smz-tmp Ds [sulfamethoxazole-trimethoprim] Nausea And Vomiting   Divalproex Sodium Other (See Comments)   Pancreatitis    Methocarbamol Other (See Comments)   hallucinations   Other    "Symbrax" alteration in blood sugar, pt unsure if low or high blood sugar   Paroxetine Other (See Comments)   Insomnia   Percocet [oxycodone-acetaminophen] Other (See Comments)   Hyperactivity   Risperidone Other (See Comments)   REACTION: hyper   Wellbutrin [bupropion] Other (See Comments)   Hot flashes   Citalopram Hydrobromide Other (See Comments)   Insomnia      Medication List       Accurate as of 05/09/17  4:51 PM.  Always use your most recent med list.          budesonide-formoterol 160-4.5 MCG/ACT inhaler Commonly known as:  SYMBICORT Inhale 2 puffs into the lungs 2 (two) times daily.   clonazePAM 0.5 MG tablet Commonly known as:  KLONOPIN Take 0.5 mg by mouth 3 (three) times daily. 1 mg in the morning and 0.5 mg in the evening.   clopidogrel 75 MG tablet Commonly known as:  PLAVIX Take 1 tablet (75 mg total) by mouth daily.   CYMBALTA 60 MG capsule Generic drug:  DULoxetine Take 60 mg by mouth every morning.   EPIPEN 2-PAK 0.3 mg/0.3 mL Soaj injection Generic drug:  EPINEPHrine Reported on 04/29/2016   esomeprazole 40 MG capsule Commonly known as:  NEXIUM Take 40 mg by mouth  daily.   fexofenadine 180 MG tablet Commonly known as:  ALLEGRA Take 180 mg by mouth every morning.   gabapentin 300 MG capsule Commonly known as:  NEURONTIN Take 300 mg by mouth at bedtime.   glucose blood test strip Commonly known as:  ONETOUCH VERIO Check blood sugars no more than twice daily   HYDROcodone-acetaminophen 5-325 MG tablet Commonly known as:  NORCO/VICODIN Take 1 tablet by mouth every 4 (four) hours as needed for moderate pain or severe pain.   lamoTRIgine 25 MG tablet Commonly known as:  LAMICTAL Take 50 mg by mouth at bedtime.   losartan 25 MG tablet Commonly known as:  COZAAR Take 1 tablet (25 mg total) by mouth daily.   multivitamin tablet Take 1 tablet by mouth daily. Senior complex   Conservation officer, nature Check blood sugars no more than twice daily   pioglitazone-metformin 15-850 MG tablet Commonly known as:  ACTOPLUS MET Take 1 tablet by mouth 2 (two) times daily with a meal.   SAPHRIS 5 MG Subl 24 hr tablet Generic drug:  asenapine Place 5 mg under the tongue at bedtime.   simvastatin 40 MG tablet Commonly known as:  ZOCOR Take 1 tablet (40 mg total) by mouth at bedtime.   traZODone 50 MG tablet Commonly known as:  DESYREL Take 150 mg by mouth at bedtime as needed for sleep.   VERAMYST 27.5 MCG/SPRAY nasal spray Generic drug:  fluticasone Place 2 sprays into the nose daily as needed.          Objective:   Physical Exam BP 128/78 (BP Location: Left Arm, Patient Position: Sitting, Cuff Size: Normal)   Pulse 79   Temp 97.6 F (36.4 C) (Oral)   Resp 14   Ht _0  (1.778 m)   Wt 237 lb 4 oz (107.6 kg)   SpO2 97%   BMI 34.04 kg/m  General:   Well developed, well nourished . NAD.  HEENT:  Normocephalic . Face symmetric, atraumatic. Throat: Symmetric, crowded, no red, no discharge. Nose is slightly congested Lungs:  CTA B Normal respiratory effort, no intercostal retractions, no accessory muscle use. Heart: RRR,  no  murmur.  no pretibial edema bilaterally  Abdomen:  Not distended, soft, non-tender. No rebound or rigidity.  DRE: Normal size prostate, not tender or nodular. Brown stools Skin: Not pale. Not jaundice Neurologic:  alert & oriented X3.  Speech normal, gait appropriate for age and unassisted Psych--  Cognition and judgment appear intact.  Cooperative with normal attention span and concentration.  Behavior appropriate. No anxious or depressed appearing.    Assessment & Plan:   Assessment DM HTN Hyperlipidemia Morbid obesity (BMI 36 plus DM) Bipolar, Depression ------ see  psychiatry, Dr Casimiro Needle DJD-- hydrocodone rarely rx by [place] ortho Asthma-Allergies ------------ Dr [name]  GERD, h/o dysphagia (chronic, neurogenic-transfer dysphagia? See GI note 12-2011) Neuro: --? stroke : saw neuro 2013 d/t B transient visual loss, ASA changed to plavix.  --See CTA, MRIs of head-neck report  --Saw neuro 11-2013: fall, syncope,parkinsonism d/t sapharis? CV: Enlarged ascending Ao per CT 12-2015,CT chest 08-2016 stable recheck q year  Nl LE arterial dopplers 2015 CT chest  07-2014----RML  58m 01-13-16  RML nodule >>> stable , benign; LLL linear scar >> f/u 6 months ; Enlarged Ascending Ao 3.9 cm: f/u yearly  08/19/2016: CT chest: Stable. Aorta about the same. HOH H/o Mnire's disease   PLAN: Multiple symptoms, unclear if all related to a single etiology. Diarrhea: Episodes of watery diarrhea weekly, last time he took antibiotics was around 12-2016. Etiology unclear. Will check TSH, LFTs, CBC, stools for culture, C. difficile and WBCs. Sore throat: Reports no fever, exam is benign. Related to postnasal dripping?  Uncontrolled GERD?.Marland KitchenFor now continue PPIs and Veramyst, reassess on RTC. LUTS: DRE  Benign today, check UA, urine culture and PSA. Lethargic: Reports lack of energy, depression "ok" per  patient RTC 2 weeks to reassess symptoms and discuss labs.

## 2017-05-09 NOTE — Assessment & Plan Note (Signed)
Multiple symptoms, unclear if all related to a single etiology. Diarrhea: Episodes of watery diarrhea weekly, last time he took antibiotics was around 12-2016. Etiology unclear. Will check TSH, LFTs, CBC, stools for culture, C. difficile and WBCs. Sore throat: Reports no fever, exam is benign. Related to postnasal dripping?  Uncontrolled GERD?[name]. For now continue PPIs and Veramyst, reassess on RTC. LUTS: DRE  Benign today, check UA, urine culture and PSA. Lethargic: Reports lack of energy, depression "ok" per  patient RTC 2 weeks to reassess symptoms and discuss labs.

## 2017-05-09 NOTE — Progress Notes (Signed)
Pre visit review using our clinic review tool, if applicable. No additional management support is needed unless otherwise documented below in the visit note. 

## 2017-05-09 NOTE — Patient Instructions (Addendum)
GO TO THE LAB :  Get the blood work   Also please provide a urine sample.  GO TO THE FRONT DESK Schedule your next appointment for a  checkup in 2 weeks

## 2017-05-10 ENCOUNTER — Ambulatory Visit (INDEPENDENT_AMBULATORY_CARE_PROVIDER_SITE_OTHER): Payer: PPO | Admitting: Podiatry

## 2017-05-10 ENCOUNTER — Other Ambulatory Visit: Payer: PPO

## 2017-05-10 ENCOUNTER — Encounter: Payer: Self-pay | Admitting: Podiatry

## 2017-05-10 VITALS — BP 152/88 | HR 78

## 2017-05-10 DIAGNOSIS — R197 Diarrhea, unspecified: Secondary | ICD-10-CM | POA: Diagnosis not present

## 2017-05-10 DIAGNOSIS — E1142 Type 2 diabetes mellitus with diabetic polyneuropathy: Secondary | ICD-10-CM

## 2017-05-10 DIAGNOSIS — L608 Other nail disorders: Secondary | ICD-10-CM | POA: Diagnosis not present

## 2017-05-10 LAB — URINE CULTURE

## 2017-05-10 NOTE — Progress Notes (Signed)
   Subjective:    Patient ID: Ryan Zhang, male    DOB: [birth year], 71 y.o.   MRN: [id number]  HPI This patient presents today with his wife present in his treatment room requesting a diabetic foot examination as well as complain of occasional discomfort in the medial margin of the right hallux toenail. Patient has history of temporary surgical treatment to the right hallux toenail by another podiatrist approximately year ago. Patient is diabetic and denies history of ulceration, claudication or amputation Patient denies history of burning, tingling, numbness in his feet Patient is a former smoker discontinued 50 years prior   Review of Systems  All other systems reviewed and are negative.      Objective:   Physical Exam  Orientated 3  Vascular: DP pulses 2/4 bilaterally PT pulses 1/4 bilaterally Capillary reflex immediate bilaterally  Neurological: Sensation to 10 g monofilament wire intact 9/9 bilaterally Vibratory sensation nonreactive bilaterally Ankle reflex reactive bilaterally  Dermatological: No open skin lesions bilaterally Atrophic skin with absent hair growth bilaterally The medial margin the right hallux toenail is mildly incurvated with mild hypertrophy and a callused nail groove. There is no surrounding erythema, edema or drainage  Musculoskeletal: Limited range of motion first MPJ bilaterally Manual motor testing dorsi flexion, plantar flexion, inversion, eversion 5/5 bilaterally       Assessment & Plan:   Assessment: Diabetic peripheral neuropathy Decreased pedal pulses bilaterally Incurvated medial margin right hallux toenail  Plan: I reviewed the results exam with patient and patient's wife. I recommended debridement of the nail. The right hallux nail was debrided mechanically and left without any bleeding  Reappoint at patient's request or yearly

## 2017-05-10 NOTE — Patient Instructions (Signed)

## 2017-05-10 NOTE — Addendum Note (Signed)
Addended by: Harley AltoPRICE, Kahliyah Dick M on: 05/10/2017 10:02 AM   Modules accepted: Orders

## 2017-05-11 LAB — CLOSTRIDIUM DIFFICILE BY PCR: Toxigenic C. Difficile by PCR: NOT DETECTED

## 2017-05-12 DIAGNOSIS — J301 Allergic rhinitis due to pollen: Secondary | ICD-10-CM | POA: Diagnosis not present

## 2017-05-12 DIAGNOSIS — J3089 Other allergic rhinitis: Secondary | ICD-10-CM | POA: Diagnosis not present

## 2017-05-12 LAB — FECAL LACTOFERRIN, QUANT: Lactoferrin: POSITIVE

## 2017-05-14 LAB — STOOL CULTURE

## 2017-05-16 DIAGNOSIS — J3089 Other allergic rhinitis: Secondary | ICD-10-CM | POA: Diagnosis not present

## 2017-05-16 DIAGNOSIS — J301 Allergic rhinitis due to pollen: Secondary | ICD-10-CM | POA: Diagnosis not present

## 2017-05-18 ENCOUNTER — Other Ambulatory Visit: Payer: Self-pay | Admitting: Internal Medicine

## 2017-05-23 ENCOUNTER — Encounter: Payer: Self-pay | Admitting: Internal Medicine

## 2017-05-23 ENCOUNTER — Other Ambulatory Visit: Payer: Self-pay | Admitting: Internal Medicine

## 2017-05-23 ENCOUNTER — Ambulatory Visit (INDEPENDENT_AMBULATORY_CARE_PROVIDER_SITE_OTHER): Payer: PPO | Admitting: Internal Medicine

## 2017-05-23 VITALS — BP 132/80 | HR 78 | Temp 98.0°F | Resp 14 | Ht 70.0 in | Wt 237.5 lb

## 2017-05-23 DIAGNOSIS — R197 Diarrhea, unspecified: Secondary | ICD-10-CM | POA: Diagnosis not present

## 2017-05-23 DIAGNOSIS — J029 Acute pharyngitis, unspecified: Secondary | ICD-10-CM | POA: Diagnosis not present

## 2017-05-23 DIAGNOSIS — R399 Unspecified symptoms and signs involving the genitourinary system: Secondary | ICD-10-CM

## 2017-05-23 MED ORDER — CLOTRIMAZOLE-BETAMETHASONE 1-0.05 % EX CREA: 1.0000 "application " | TOPICAL_CREAM | Freq: Two times a day (BID) | CUTANEOUS | 2 refills | Status: DC

## 2017-05-23 NOTE — Progress Notes (Signed)
Subjective:    Patient ID: Ryan Zhang, male    DOB: [birth year], 71 y.o.   MRN: [id number]  DOS:  05/23/2017 Type of visit - description : Follow-up Interval history: Was seen recently with multiple symptoms. Workup came back essentially negative. GI symptoms decrease, back to normal, occasional loose stools but denies nausea, vomiting, blood in the stools. Appetite is normal. He realizes that he is urinating more because he is drinking more fluids due to the weather. Request a refill on a cream he uses as needed   Review of Systems   Past Medical History:  Diagnosis Date  . Allergy    allergy shots Dr. Velora Heckler  . Asthma    moderate persistant  . Bipolar affective ([place])   . Diabetes mellitus   . DJD (degenerative joint disease)   . Dysphagia   . Gallstones    hx of, s/p cholecystectomy  . GERD (gastroesophageal reflux disease)   . Hepatitis A    as teenager 65's  . Hollenhorst plaque    right eye  . Hyperlipidemia   . Hypertension   . Kidney stones    hx of  . Meniere's disease   . Motility disorder, esophageal   . Obesity   . Pancreatitis   . Personal history of colonic polyps 11/2004   hyperplastic.  . Stroke [facility])    per MRI    Past Surgical History:  Procedure Laterality Date  . CATARACT EXTRACTION Bilateral 09/2015 and 10/2015  . CHOLECYSTECTOMY    . TOTAL KNEE ARTHROPLASTY Bilateral    both knees    Social History   Social History  . Marital status: Married    Spouse name: N/A  . Number of children: 1  . Years of education: N/A   Occupational History  . disabled Disabled    laboratory computing/programming   Social History Main Topics  . Smoking status: Former Smoker    Quit date: 11/15/1964  . Smokeless tobacco: Never Used  . Alcohol use No  . Drug use: No  . Sexual activity: No   Other Topics Concern  . Not on file   Social History Narrative   Household-- pt , wife, adopted  daughter (h/o substance abuse,on a methadone program,  doing better )      Allergies as of 05/23/2017      Reactions   Smz-tmp Ds [sulfamethoxazole-trimethoprim] Nausea And Vomiting   Divalproex Sodium Other (See Comments)   Pancreatitis    Methocarbamol Other (See Comments)   hallucinations   Other    "Symbrax" alteration in blood sugar, pt unsure if low or high blood sugar   Paroxetine Other (See Comments)   Insomnia   Percocet [oxycodone-acetaminophen] Other (See Comments)   Hyperactivity   Risperidone Other (See Comments)   REACTION: hyper   Wellbutrin [bupropion] Other (See Comments)   Hot flashes   Citalopram Hydrobromide Other (See Comments)   Insomnia      Medication List       Accurate as of 05/23/17 11:59 PM. Always use your most recent med list.          budesonide-formoterol 160-4.5 MCG/ACT inhaler Commonly known as:  SYMBICORT Inhale 2 puffs into the lungs 2 (two) times daily.   clonazePAM 0.5 MG tablet Commonly known as:  KLONOPIN Take 0.5 mg by mouth 3 (three) times daily. 1 mg in the morning and 0.5 mg in the evening.   clopidogrel 75 MG tablet Commonly known as:  PLAVIX Take 1  tablet (75 mg total) by mouth daily.   clotrimazole-betamethasone cream Commonly known as:  LOTRISONE Apply 1 application topically 2 (two) times daily.   CYMBALTA 60 MG capsule Generic drug:  DULoxetine Take 60 mg by mouth every morning.   EPIPEN 2-PAK 0.3 mg/0.3 mL Soaj injection Generic drug:  EPINEPHrine Reported on 04/29/2016   esomeprazole 40 MG capsule Commonly known as:  NEXIUM Take 40 mg by mouth daily.   fexofenadine 180 MG tablet Commonly known as:  ALLEGRA Take 180 mg by mouth every morning.   gabapentin 300 MG capsule Commonly known as:  NEURONTIN Take 300 mg by mouth at bedtime.   glucose blood test strip Commonly known as:  ONETOUCH VERIO Check blood sugars no more than twice daily   HYDROcodone-acetaminophen 5-325 MG tablet Commonly known as:  NORCO/VICODIN Take 1 tablet by mouth every 4 (four)  hours as needed for moderate pain or severe pain.   lamoTRIgine 25 MG tablet Commonly known as:  LAMICTAL Take 50 mg by mouth at bedtime.   losartan 25 MG tablet Commonly known as:  COZAAR Take 1 tablet (25 mg total) by mouth daily.   multivitamin tablet Take 1 tablet by mouth daily. Senior complex   Conservation officer, nature Check blood sugars no more than twice daily   pioglitazone-metformin 15-850 MG tablet Commonly known as:  ACTOPLUS MET Take 1 tablet by mouth 2 (two) times daily with a meal.   SAPHRIS 5 MG Subl 24 hr tablet Generic drug:  asenapine Place 5 mg under the tongue at bedtime.   simvastatin 40 MG tablet Commonly known as:  ZOCOR Take 1 tablet (40 mg total) by mouth at bedtime.   traZODone 50 MG tablet Commonly known as:  DESYREL Take 150 mg by mouth at bedtime as needed for sleep.   VERAMYST 27.5 MCG/SPRAY nasal spray Generic drug:  fluticasone Place 2 sprays into the nose daily as needed.          Objective:   Physical Exam BP 132/80 (BP Location: Left Arm, Patient Position: Sitting, Cuff Size: Normal)   Pulse 78   Temp 98 F (36.7 C) (Oral)   Resp 14   Ht _0  (1.778 m)   Wt 237 lb 8 oz (107.7 kg)   SpO2 96%   BMI 34.08 kg/m  General:   Well developed, well nourished . NAD.  HEENT:  Normocephalic . Face symmetric, atraumatic Lungs:  CTA B Normal respiratory effort, no intercostal retractions, no accessory muscle use. Heart: RRR,  no murmur.  no pretibial edema bilaterally  Abdomen:  Not distended, soft, non-tender. No rebound or rigidity.  Skin: Not pale. Not jaundice Neurologic:  alert & oriented X3.  Speech normal, gait appropriate for age and unassisted Psych--  Cognition and judgment appear intact.  Cooperative with normal attention span and concentration.  Behavior appropriate. No anxious or depressed appearing.    Assessment & Plan:    Assessment DM HTN Hyperlipidemia Morbid obesity (BMI 36 plus DM) Bipolar,  Depression ------ see psychiatry, Dr Casimiro Needle DJD-- hydrocodone rarely rx by [place] ortho Asthma-Allergies ------------ Dr [name]  GERD, h/o dysphagia (chronic, neurogenic-transfer dysphagia? See GI note 12-2011) Neuro: --? stroke : saw neuro 2013 d/t B transient visual loss, ASA changed to plavix.  --See CTA, MRIs of head-neck report  --Saw neuro 11-2013: fall, syncope,parkinsonism d/t sapharis? CV: Enlarged ascending Ao per CT 12-2015,CT chest 08-2016 stable recheck q year  Nl LE arterial dopplers 2015 CT chest  07-2014----RML  47m  01-13-16  RML nodule >>> stable , benign; LLL linear scar >> f/u 6 months ; Enlarged Ascending Ao 3.9 cm: f/u yearly  08/19/2016: CT chest: Stable. Aorta about the same. HOH H/o Mnire's disease   PLAN: Diarrhea:  W/u (-)  except for some WBCs in the stools. Patient is back to normal with occasional loose stools ( "if I eat too much salads"). Recommend observation, if he has symptoms he will let me know, we'll recheck stool studies. Sore throat: improved L UTS: Essentially back to normal. PSA normal. Phymosis, recurrent balanitis Request a refill on clotrimazole betamethasone that he uses as needed for fungal infection in the penis. RTC 09-2017

## 2017-05-23 NOTE — Patient Instructions (Addendum)
  See you in November  

## 2017-05-23 NOTE — Progress Notes (Signed)
Pre visit review using our clinic review tool, if applicable. No additional management support is needed unless otherwise documented below in the visit note. 

## 2017-05-24 ENCOUNTER — Encounter: Payer: Self-pay | Admitting: Internal Medicine

## 2017-05-24 NOTE — Assessment & Plan Note (Signed)
Diarrhea:  W/u (-)  except for some WBCs in the stools. Patient is back to normal with occasional loose stools ( "if I eat too much salads"). Recommend observation, if he has symptoms he will let me know, we'll recheck stool studies. Sore throat: improved L UTS: Essentially back to normal. PSA normal. Phymosis, recurrent balanitis Request a refill on clotrimazole betamethasone that he uses as needed for fungal infection in the penis. RTC 09-2017

## 2017-05-25 DIAGNOSIS — J3089 Other allergic rhinitis: Secondary | ICD-10-CM | POA: Diagnosis not present

## 2017-05-25 DIAGNOSIS — J301 Allergic rhinitis due to pollen: Secondary | ICD-10-CM | POA: Diagnosis not present

## 2017-05-26 DIAGNOSIS — F3175 Bipolar disorder, in partial remission, most recent episode depressed: Secondary | ICD-10-CM | POA: Diagnosis not present

## 2017-06-01 DIAGNOSIS — J301 Allergic rhinitis due to pollen: Secondary | ICD-10-CM | POA: Diagnosis not present

## 2017-06-01 DIAGNOSIS — J3089 Other allergic rhinitis: Secondary | ICD-10-CM | POA: Diagnosis not present

## 2017-06-07 DIAGNOSIS — F3131 Bipolar disorder, current episode depressed, mild: Secondary | ICD-10-CM | POA: Diagnosis not present

## 2017-06-08 DIAGNOSIS — J3089 Other allergic rhinitis: Secondary | ICD-10-CM | POA: Diagnosis not present

## 2017-06-08 DIAGNOSIS — J301 Allergic rhinitis due to pollen: Secondary | ICD-10-CM | POA: Diagnosis not present

## 2017-06-15 DIAGNOSIS — J3089 Other allergic rhinitis: Secondary | ICD-10-CM | POA: Diagnosis not present

## 2017-06-15 DIAGNOSIS — J301 Allergic rhinitis due to pollen: Secondary | ICD-10-CM | POA: Diagnosis not present

## 2017-06-16 DIAGNOSIS — H26491 Other secondary cataract, right eye: Secondary | ICD-10-CM | POA: Diagnosis not present

## 2017-06-16 DIAGNOSIS — Z961 Presence of intraocular lens: Secondary | ICD-10-CM | POA: Diagnosis not present

## 2017-06-16 DIAGNOSIS — E119 Type 2 diabetes mellitus without complications: Secondary | ICD-10-CM | POA: Diagnosis not present

## 2017-06-16 DIAGNOSIS — H35033 Hypertensive retinopathy, bilateral: Secondary | ICD-10-CM | POA: Diagnosis not present

## 2017-06-16 DIAGNOSIS — H5211 Myopia, right eye: Secondary | ICD-10-CM | POA: Diagnosis not present

## 2017-06-16 DIAGNOSIS — H524 Presbyopia: Secondary | ICD-10-CM | POA: Diagnosis not present

## 2017-06-16 DIAGNOSIS — H52222 Regular astigmatism, left eye: Secondary | ICD-10-CM | POA: Diagnosis not present

## 2017-06-16 LAB — HM DIABETES EYE EXAM

## 2017-06-17 ENCOUNTER — Encounter: Payer: Self-pay | Admitting: Internal Medicine

## 2017-06-19 ENCOUNTER — Other Ambulatory Visit: Payer: Self-pay | Admitting: Internal Medicine

## 2017-06-22 DIAGNOSIS — J3089 Other allergic rhinitis: Secondary | ICD-10-CM | POA: Diagnosis not present

## 2017-06-22 DIAGNOSIS — J301 Allergic rhinitis due to pollen: Secondary | ICD-10-CM | POA: Diagnosis not present

## 2017-06-29 DIAGNOSIS — J3089 Other allergic rhinitis: Secondary | ICD-10-CM | POA: Diagnosis not present

## 2017-06-29 DIAGNOSIS — J301 Allergic rhinitis due to pollen: Secondary | ICD-10-CM | POA: Diagnosis not present

## 2017-06-30 DIAGNOSIS — F3175 Bipolar disorder, in partial remission, most recent episode depressed: Secondary | ICD-10-CM | POA: Diagnosis not present

## 2017-07-06 DIAGNOSIS — J301 Allergic rhinitis due to pollen: Secondary | ICD-10-CM | POA: Diagnosis not present

## 2017-07-06 DIAGNOSIS — J3089 Other allergic rhinitis: Secondary | ICD-10-CM | POA: Diagnosis not present

## 2017-07-13 DIAGNOSIS — J301 Allergic rhinitis due to pollen: Secondary | ICD-10-CM | POA: Diagnosis not present

## 2017-07-13 DIAGNOSIS — J3089 Other allergic rhinitis: Secondary | ICD-10-CM | POA: Diagnosis not present

## 2017-07-20 DIAGNOSIS — J3089 Other allergic rhinitis: Secondary | ICD-10-CM | POA: Diagnosis not present

## 2017-07-20 DIAGNOSIS — J301 Allergic rhinitis due to pollen: Secondary | ICD-10-CM | POA: Diagnosis not present

## 2017-07-22 ENCOUNTER — Encounter (HOSPITAL_BASED_OUTPATIENT_CLINIC_OR_DEPARTMENT_OTHER): Payer: Self-pay

## 2017-07-22 ENCOUNTER — Emergency Department (HOSPITAL_BASED_OUTPATIENT_CLINIC_OR_DEPARTMENT_OTHER)
Admission: EM | Admit: 2017-07-22 | Discharge: 2017-07-23 | Disposition: A | Discharge status: Home / Self Care | Payer: PPO | Attending: Emergency Medicine | Admitting: Emergency Medicine

## 2017-07-22 DIAGNOSIS — Z79899 Other long term (current) drug therapy: Secondary | ICD-10-CM | POA: Insufficient documentation

## 2017-07-22 DIAGNOSIS — Z7902 Long term (current) use of antithrombotics/antiplatelets: Secondary | ICD-10-CM | POA: Diagnosis not present

## 2017-07-22 DIAGNOSIS — J45909 Unspecified asthma, uncomplicated: Secondary | ICD-10-CM | POA: Diagnosis not present

## 2017-07-22 DIAGNOSIS — R05 Cough: Secondary | ICD-10-CM | POA: Diagnosis not present

## 2017-07-22 DIAGNOSIS — Z87891 Personal history of nicotine dependence: Secondary | ICD-10-CM | POA: Insufficient documentation

## 2017-07-22 DIAGNOSIS — Z8673 Personal history of transient ischemic attack (TIA), and cerebral infarction without residual deficits: Secondary | ICD-10-CM | POA: Insufficient documentation

## 2017-07-22 DIAGNOSIS — I10 Essential (primary) hypertension: Secondary | ICD-10-CM | POA: Diagnosis not present

## 2017-07-22 DIAGNOSIS — J181 Lobar pneumonia, unspecified organism: Secondary | ICD-10-CM | POA: Insufficient documentation

## 2017-07-22 DIAGNOSIS — E1140 Type 2 diabetes mellitus with diabetic neuropathy, unspecified: Secondary | ICD-10-CM | POA: Diagnosis not present

## 2017-07-22 DIAGNOSIS — R0602 Shortness of breath: Secondary | ICD-10-CM | POA: Diagnosis not present

## 2017-07-22 DIAGNOSIS — J189 Pneumonia, unspecified organism: Secondary | ICD-10-CM

## 2017-07-22 NOTE — ED Triage Notes (Signed)
Pt received a flu shot today, came home and tonight had an episode of vomiting, was too weak to get off floor, now feels like he has fluid in his lungs, coarse sounds on left side, c/o lateral chest pressure as well.  He tried using his rescue inhaler without relief

## 2017-07-23 ENCOUNTER — Emergency Department (HOSPITAL_BASED_OUTPATIENT_CLINIC_OR_DEPARTMENT_OTHER): Payer: PPO

## 2017-07-23 DIAGNOSIS — R0602 Shortness of breath: Secondary | ICD-10-CM | POA: Diagnosis not present

## 2017-07-23 DIAGNOSIS — R05 Cough: Secondary | ICD-10-CM | POA: Diagnosis not present

## 2017-07-23 LAB — BASIC METABOLIC PANEL
Anion gap: 10 (ref 5–15)
BUN: 27 mg/dL — ABNORMAL HIGH (ref 6–20)
CO2: 24 mmol/L (ref 22–32)
Calcium: 9 mg/dL (ref 8.9–10.3)
Chloride: 104 mmol/L (ref 101–111)
Creatinine, Ser: 0.97 mg/dL (ref 0.61–1.24)
GFR calc Af Amer: >60 mL/min (ref >60)
GFR calc non Af Amer: >60 mL/min (ref >60)
Glucose, Bld: 156 mg/dL — ABNORMAL HIGH (ref 65–99)
Potassium: 3.6 mmol/L (ref 3.5–5.1)
Sodium: 138 mmol/L (ref 135–145)

## 2017-07-23 LAB — CBC WITH DIFFERENTIAL/PLATELET
Basophils Absolute: 0 10*3/uL (ref 0.0–0.1)
Basophils Relative: 0 %
Eosinophils Absolute: 0 10*3/uL (ref 0.0–0.7)
Eosinophils Relative: 0 %
HCT: 35.9 % — ABNORMAL LOW (ref 39.0–52.0)
Hemoglobin: 11.6 g/dL — ABNORMAL LOW (ref 13.0–17.0)
Lymphocytes Relative: 19 %
Lymphs Abs: 2.2 10*3/uL (ref 0.7–4.0)
MCH: 26.3 pg (ref 26.0–34.0)
MCHC: 32.3 g/dL (ref 30.0–36.0)
MCV: 81.4 fL (ref 78.0–100.0)
Monocytes Absolute: 0.7 10*3/uL (ref 0.1–1.0)
Monocytes Relative: 6 %
Neutro Abs: 8.7 10*3/uL — ABNORMAL HIGH (ref 1.7–7.7)
Neutrophils Relative %: 75 %
Platelets: 241 10*3/uL (ref 150–400)
RBC: 4.41 MIL/uL (ref 4.22–5.81)
RDW: 16.2 % — ABNORMAL HIGH (ref 11.5–15.5)
WBC: 11.7 10*3/uL — ABNORMAL HIGH (ref 4.0–10.5)

## 2017-07-23 LAB — BRAIN NATRIURETIC PEPTIDE: B Natriuretic Peptide: 62.1 pg/mL (ref 0.0–100.0)

## 2017-07-23 LAB — TROPONIN I: Troponin I: <0.03 ng/mL (ref <0.03)

## 2017-07-23 MED ORDER — AZITHROMYCIN 250 MG PO TABS
ORAL_TABLET | ORAL | Status: AC
  Filled 2017-07-23: qty 2

## 2017-07-23 MED ORDER — ALBUTEROL SULFATE HFA 108 (90 BASE) MCG/ACT IN AERS
2.0000 | INHALATION_SPRAY | Freq: Once | RESPIRATORY_TRACT | Status: AC
  Administered 2017-07-23: 2 via RESPIRATORY_TRACT
  Filled 2017-07-23: qty 6.7

## 2017-07-23 MED ORDER — DEXTROSE 5 % IV SOLN
1.0000 g | Freq: Once | INTRAVENOUS | Status: AC
  Administered 2017-07-23: 1 g via INTRAVENOUS

## 2017-07-23 MED ORDER — AZITHROMYCIN 250 MG PO TABS
500.0000 mg | ORAL_TABLET | Freq: Once | ORAL | Status: AC
  Administered 2017-07-23: 500 mg via ORAL

## 2017-07-23 MED ORDER — DOXYCYCLINE HYCLATE 100 MG PO CAPS: 100.0000 mg | ORAL_CAPSULE | Freq: Two times a day (BID) | ORAL | 0 refills | Status: DC

## 2017-07-23 MED ORDER — AMOXICILLIN-POT CLAVULANATE 875-125 MG PO TABS: 1.0000 | ORAL_TABLET | Freq: Two times a day (BID) | ORAL | 0 refills | Status: DC

## 2017-07-23 MED ORDER — CEFTRIAXONE SODIUM 1 G IJ SOLR
INTRAMUSCULAR | Status: AC
  Filled 2017-07-23: qty 10

## 2017-07-23 MED ORDER — ALBUTEROL SULFATE (2.5 MG/3ML) 0.083% IN NEBU
5.0000 mg | INHALATION_SOLUTION | Freq: Once | RESPIRATORY_TRACT | Status: AC
  Administered 2017-07-23: 5 mg via RESPIRATORY_TRACT
  Filled 2017-07-23: qty 6

## 2017-07-23 MED ORDER — ONDANSETRON HCL 4 MG/2ML IJ SOLN
4.0000 mg | Freq: Once | INTRAMUSCULAR | Status: AC
  Administered 2017-07-23: 4 mg via INTRAVENOUS
  Filled 2017-07-23: qty 2

## 2017-07-23 MED ORDER — IPRATROPIUM-ALBUTEROL 0.5-2.5 (3) MG/3ML IN SOLN
3.0000 mL | Freq: Four times a day (QID) | RESPIRATORY_TRACT | Status: DC
  Administered 2017-07-23: 3 mL via RESPIRATORY_TRACT
  Filled 2017-07-23: qty 3

## 2017-07-23 NOTE — ED Provider Notes (Signed)
[facility] Provider Note   CSN: [id number] Arrival date & time: 07/22/17  2344     History   Chief Complaint Chief Complaint  Patient presents with  . Shortness of Breath    HPI Ryan Zhang is a 71 y.o. male.  The history is provided by the patient and the spouse.  Shortness of Breath  This is a new problem. The problem occurs frequently.The current episode started 3 to 5 hours ago. The problem has been gradually worsening. Associated symptoms include cough, wheezing, chest pain and vomiting. Pertinent negatives include no fever, no hemoptysis and no syncope. He has tried beta-agonist inhalers for the symptoms. The treatment provided no relief. Associated medical issues include asthma.  Patient with h/o asthma presents with acute onset of nausea/vomiting/cough and shortness of breath He also reports pleuritic chest pain He reports he had his flu shot today at [employer] He felt well afterwards and walked his dog However later in the evening he became ill No fever is reported   Past Medical History:  Diagnosis Date  . Allergy    allergy shots Dr. Corinda Gubler  . Asthma    moderate persistant  . Bipolar affective (HCC)   . Diabetes mellitus   . DJD (degenerative joint disease)   . Dysphagia   . Gallstones    hx of, s/p cholecystectomy  . GERD (gastroesophageal reflux disease)   . Hepatitis A    as teenager 60's  . Hollenhorst plaque    right eye  . Hyperlipidemia   . Hypertension   . Kidney stones    hx of  . Meniere's disease   . Motility disorder, esophageal   . Obesity   . Pancreatitis   . Personal history of colonic polyps 11/2004   hyperplastic.  . Stroke [facility])    per MRI    Patient Active Problem List   Diagnosis Date Noted  . PCP NOTES >>>>>>>>>>>>>>>>>>>>>>>>>>>>>>> 01/12/2016  . Dry skin 05/09/2015  . Folliculitis 05/09/2015  . Hearing loss in right ear 08/05/2014  . Lumbar back pain 08/05/2014  . Pre-ulcerative corn or callous 12/27/2013   . Panic attack as reaction to stress 04/20/2012  . Dysphagia, neurologic 01/07/2012  . Morbid obesity (HCC) 01/07/2012  . Chest pain, musculoskeletal 12/07/2011  . Black-out (not amnesia) 12/07/2011  . Candidiasis of skin 08/12/2011  . Annual physical exam 06/07/2011  . Dysphagia 03/09/2011  . OLECRANON BURSITIS 01/29/2011  . GASTROENTERITIS 12/24/2010  . PARTIAL ARTERIAL OCCLUSION OF RETINA 05/27/2010  . ESSENTIAL HYPERTENSION, BENIGN 01/16/2010  . ABNORMAL ELECTROCARDIOGRAM 01/14/2010  . ASTHMA, MILD, INTERMITTENT 10/14/2009  . GERD 10/14/2009  . LACERATION OF FINGER 05/14/2009  . Bipolar disorder (HCC) 02/20/2009  . PHIMOSIS 02/20/2009  . KNEE PAIN, CHRONIC 11/20/2008  . HIP PAIN, RIGHT 08/19/2008  . DM type 2 with diabetic peripheral neuropathy (HCC) 02/22/2007  . Hyperlipidemia associated with type 2 diabetes mellitus (HCC) 02/22/2007  . ALLERGIC RHINITIS, SEASONAL 02/22/2007  . LOW BACK PAIN, CHRONIC 02/22/2007  . MENIERE'S DISEASE, HX OF 02/22/2007    Past Surgical History:  Procedure Laterality Date  . CATARACT EXTRACTION Bilateral 09/2015 and 10/2015  . CHOLECYSTECTOMY    . TOTAL KNEE ARTHROPLASTY Bilateral    both knees       Home Medications    Prior to Admission medications   Medication Sig Start Date End Date Taking? Authorizing Provider  asenapine (SAPHRIS) 5 MG SUBL Place 5 mg under the tongue at bedtime.     [provider]  budesonide-formoterol (SYMBICORT) 160-4.5 MCG/ACT inhaler Inhale 2 puffs into the lungs 2 (two) times daily.     [provider]  clonazePAM (KLONOPIN) 0.5 MG tablet Take 0.5 mg by mouth 3 (three) times daily. 1 mg in the morning and 0.5 mg in the evening.    [provider]  clopidogrel (PLAVIX) 75 MG tablet Take 1 tablet (75 mg total) by mouth daily. 03/28/17   Colon Branch, MD  clotrimazole-betamethasone (LOTRISONE) cream Apply 1 application topically 2 (two) times daily. 05/23/17   Colon Branch, MD    DULoxetine (CYMBALTA) 60 MG capsule Take 60 mg by mouth every morning.     [provider]  EPIPEN 2-PAK 0.3 MG/0.3ML SOAJ injection Reported on 04/29/2016 07/04/14   [provider]  esomeprazole (NEXIUM) 40 MG capsule Take 40 mg by mouth daily.     [provider]  fexofenadine (ALLEGRA) 180 MG tablet Take 180 mg by mouth every morning.     [provider]  fluticasone (VERAMYST) 27.5 MCG/SPRAY nasal spray Place 2 sprays into the nose daily as needed.     [provider]  gabapentin (NEURONTIN) 300 MG capsule Take 300 mg by mouth at bedtime.    [provider]  glucose blood (ONETOUCH VERIO) test strip Check blood sugars no more than twice daily 12/09/16   Colon Branch, MD  HYDROcodone-acetaminophen (NORCO/VICODIN) 5-325 MG per tablet Take 1 tablet by mouth every 4 (four) hours as needed for moderate pain or severe pain. 07/31/14   Pattricia Boss, MD  lamoTRIgine (LAMICTAL) 25 MG tablet Take 50 mg by mouth at bedtime.    [provider]  Lancets [name] ULTRASOFT) lancets Check blood sugars no more than twice daily 12/09/16   Colon Branch, MD  losartan (COZAAR) 25 MG tablet Take 1 tablet (25 mg total) by mouth daily. 06/20/17   Colon Branch, MD  Multiple Vitamin (MULTIVITAMIN) tablet Take 1 tablet by mouth daily. Senior complex    [provider]  pioglitazone-metformin (ACTOPLUS MET) 15-850 MG tablet Take 1 tablet by mouth 2 (two) times daily with a meal. 05/19/17   Colon Branch, MD  simvastatin (ZOCOR) 40 MG tablet Take 1 tablet (40 mg total) by mouth at bedtime. 04/18/17   Colon Branch, MD  traZODone (DESYREL) 50 MG tablet Take 150 mg by mouth at bedtime as needed for sleep.  07/09/14   Midge Minium, MD    Family History Family History  Problem Relation Age of Onset  . Diabetes Father   . Heart disease Brother   . Heart disease Maternal Grandmother   . Cancer Sister        unknown type  . Colon cancer Neg Hx   . Prostate  cancer Neg Hx     Social History Social History  Substance Use Topics  . Smoking status: Former Smoker    Quit date: 11/15/1964  . Smokeless tobacco: Never Used  . Alcohol use No     Allergies   Smz-tmp ds [sulfamethoxazole-trimethoprim]; Divalproex sodium; Methocarbamol; Other; Paroxetine; Percocet [oxycodone-acetaminophen]; Risperidone; Wellbutrin [bupropion]; and Citalopram hydrobromide   Review of Systems Review of Systems  Constitutional: Negative for fever.  Respiratory: Positive for cough, shortness of breath and wheezing. Negative for hemoptysis.   Cardiovascular: Positive for chest pain. Negative for syncope.  Gastrointestinal: Positive for vomiting.  Neurological: Negative for syncope.  All other systems reviewed and are negative.    Physical Exam Updated Vital Signs  BP 133/82 (BP Location: Right Arm)   Pulse 97   Temp 98.1 F (36.7 C) (Oral)   Resp 20   Ht 1.803 m (_0 )   Wt 102.1 kg (225 lb)   SpO2 96%   BMI 31.38 kg/m   Physical Exam CONSTITUTIONAL: Well developed/well nourished HEAD: Normocephalic/atraumatic EYES: EOMI ENMT: Mucous membranes moist NECK: supple no meningeal signs SPINE/BACK:entire spine nontender CV: S1/S2 noted, no murmurs/rubs/gallops noted LUNGS: scattered wheeze noted.  Crackles in bilateral bases ABDOMEN: soft, nontender GU:no cva tenderness NEURO: Pt is awake/alert/appropriate, moves all extremitiesx4.  No facial droop.   EXTREMITIES: pulses normal/equal, full ROM SKIN: warm, color normal PSYCH: no abnormalities of mood noted, alert and oriented to situation   ED Treatments / Results  Labs (all labs ordered are listed, but only abnormal results are displayed) Labs Reviewed  BASIC METABOLIC PANEL - Abnormal; Notable for the following:       Result Value   Glucose, Bld 156 (*)    BUN 27 (*)    All other components within normal limits  CBC WITH DIFFERENTIAL/PLATELET - Abnormal; Notable for the following:    WBC  11.7 (*)    Hemoglobin 11.6 (*)    HCT 35.9 (*)    RDW 16.2 (*)    Neutro Abs 8.7 (*)    All other components within normal limits  TROPONIN I  BRAIN NATRIURETIC PEPTIDE    EKG  EKG Interpretation  Date/Time:  Friday July 22 2017 23:50:49 EDT Ventricular Rate:  99 PR Interval:  150 QRS Duration: 92 QT Interval:  360 QTC Calculation: 462 R Axis:   54 Text Interpretation:  Normal sinus rhythm Low voltage QRS Incomplete right bundle branch block T wave abnormality, consider inferior ischemia T wave abnormality, consider anterolateral ischemia Prolonged QT Abnormal ECG Confirmed by Ripley Fraise [phone]) on 07/23/2017 12:05:42 AM       Radiology Dg Chest 2 View  Result Date: 07/23/2017 CLINICAL DATA:  Acute onset of shortness of breath, cough and congestion. EXAM: CHEST  2 VIEW COMPARISON:  Radiograph 01/02/2017 FINDINGS: Unchanged heart size and mediastinal contours. Posterior retrocardiac consolidation localizing to the left lower lobe. Minimal fluid in the right minor fissure versus pleural thickening. Right lung is otherwise clear. No definite pleural fluid. No pulmonary edema. Mild chronic bronchial thickening. No pneumothorax. No acute osseous abnormalities. IMPRESSION: Left lower lobe consolidation consistent with pneumonia in the setting of cough and congestion. Followup PA and lateral chest X-ray is recommended in 3-4 weeks following trial of antibiotic therapy to ensure resolution and exclude underlying malignancy. Electronically Signed   By: Jeb Levering M.D.   On: 07/23/2017 00:23    Procedures Procedures  Medications Ordered in ED Medications  ipratropium-albuterol (DUONEB) 0.5-2.5 (3) MG/3ML nebulizer solution 3 mL (3 mLs Nebulization Given 07/23/17 0032)  azithromycin (ZITHROMAX) 250 MG tablet (not administered)  cefTRIAXone (ROCEPHIN) 1 g injection (not administered)  cefTRIAXone (ROCEPHIN) 1 g in dextrose 5 % 50 mL IVPB (0 g Intravenous Stopped 07/23/17 0210)    azithromycin (ZITHROMAX) tablet 500 mg (500 mg Oral Given 07/23/17 0117)  albuterol (PROVENTIL) (2.5 MG/3ML) 0.083% nebulizer solution 5 mg (5 mg Nebulization Given 07/23/17 0143)  ondansetron (ZOFRAN) injection 4 mg (4 mg Intravenous Given 07/23/17 0208)  albuterol (PROVENTIL HFA;VENTOLIN HFA) 108 (90 Base) MCG/ACT inhaler 2 puff (2 puffs Inhalation Given 07/23/17 0345)     Initial Impression / Assessment and Plan / ED Course  I have reviewed the triage vital signs and the nursing  notes.  Pertinent labs & imaging results that were available during my care of the patient were reviewed by me and considered in my medical decision making (see chart for details).     1:06 AM Pt with pneumonia Will treat and reassess 3:57 AM Overall pt is improved He is awake/alert Work of breathing improved Lung sounds improved, wheeze resolved and only has crackles in left base He ambulated and he denies dyspnea While on RA pulse ox >93% on my eval He would like to be discharged He feels comfortable taking antibiotics at home Will give dual ABX therapy He will use albuterol frequently PCP f/u in 2 weeks for repeat CXR  We discussed strict ER return precautions  Final Clinical Impressions(s) / ED Diagnoses   Final diagnoses:  Community acquired pneumonia of left lower lobe of lung (HCC)    New Prescriptions New Prescriptions   AMOXICILLIN-CLAVULANATE (AUGMENTIN) 875-125 MG TABLET    Take 1 tablet by mouth 2 (two) times daily. One po bid x 7 days   DOXYCYCLINE (VIBRAMYCIN) 100 MG CAPSULE    Take 1 capsule (100 mg total) by mouth 2 (two) times daily. One po bid x 7 days     Ripley Fraise, MD 07/23/17 [phone]

## 2017-07-23 NOTE — ED Notes (Signed)
Pt ambulated around the department. Pt SpO2 remained at 92-90%. Pt HR was 112-115. Pt had a steady gate. Pt did not report any SOB.

## 2017-07-26 ENCOUNTER — Telehealth: Payer: Self-pay

## 2017-07-26 NOTE — Telephone Encounter (Signed)
Okay, we'll see him 08/08/2017

## 2017-07-26 NOTE — Telephone Encounter (Signed)
Open a phone note, needs some follow-up in about 10 days for pneumonia. Seen at the ER  Received: 3 days ago  Message Contents  Wanda PlumpPaz, Jose E, MD  Conrad Burlingtonanter, Jaire Pinkham, CMA     Pt called-earliest appt he could do is 08/08/2017 for follow-up. Appt scheduled.

## 2017-07-28 DIAGNOSIS — F3175 Bipolar disorder, in partial remission, most recent episode depressed: Secondary | ICD-10-CM | POA: Diagnosis not present

## 2017-08-01 ENCOUNTER — Ambulatory Visit (INDEPENDENT_AMBULATORY_CARE_PROVIDER_SITE_OTHER): Payer: PPO | Admitting: Family Medicine

## 2017-08-01 ENCOUNTER — Encounter: Payer: Self-pay | Admitting: Family Medicine

## 2017-08-01 VITALS — BP 110/68 | HR 88 | Temp 98.1°F | Ht 70.0 in | Wt 228.1 lb

## 2017-08-01 DIAGNOSIS — J4541 Moderate persistent asthma with (acute) exacerbation: Secondary | ICD-10-CM

## 2017-08-01 MED ORDER — PREDNISONE 20 MG PO TABS: 20.0000 mg | ORAL_TABLET | Freq: Every day | ORAL | 0 refills | Status: DC

## 2017-08-01 NOTE — Patient Instructions (Addendum)
Continue to push fluids, practice good hand hygiene, and cover your mouth if you cough.  If you start having fevers, shaking or worsening shortness of breath, seek immediate care.  Keep appointment with Dr. Drue Novel. Ask him about the follow up chest X-ray.

## 2017-08-01 NOTE — Progress Notes (Signed)
Pre visit review using our clinic review tool, if applicable. No additional management support is needed unless otherwise documented below in the visit note. 

## 2017-08-01 NOTE — Progress Notes (Signed)
Chief Complaint  Patient presents with  . Cough    Ryan Zhang here for URI complaints.  Duration: 8 days  Associated symptoms: chest pain, cough, shortness of breath, wheezing +Hx of asthma, has been using albuterol inhaler q 4 hrs with some relief, last use was 15 min ago Denies: sinus congestion, sinus pain, itchy watery eyes, ear pain, ear drainage, sore throat, myalgia and fevers/rigors Treatment to date: Doxy and Augmentin; Robitussin Dx'd w LLL lobar PNA on 9/8 Sick contacts: No  ROS:  Const: Denies fevers HEENT: As noted in HPI Lungs: +SOB  Past Medical History:  Diagnosis Date  . Allergy    allergy shots Dr. Corinda Gubler  . Asthma    moderate persistant  . Bipolar affective (HCC)   . Diabetes mellitus   . DJD (degenerative joint disease)   . Dysphagia   . Gallstones    hx of, s/p cholecystectomy  . GERD (gastroesophageal reflux disease)   . Hepatitis A    as teenager 60's  . Hollenhorst plaque    right eye  . Hyperlipidemia   . Hypertension   . Kidney stones    hx of  . Meniere's disease   . Motility disorder, esophageal   . Obesity   . Pancreatitis   . Personal history of colonic polyps 11/2004   hyperplastic.  . Stroke [facility])    per MRI   Family History  Problem Relation Age of Onset  . Diabetes Father   . Heart disease Brother   . Heart disease Maternal Grandmother   . Cancer Sister        unknown type  . Colon cancer Neg Hx   . Prostate cancer Neg Hx     BP 110/68 (BP Location: Left Arm, Patient Position: Sitting, Cuff Size: Large)   Pulse 88   Temp 98.1 F (36.7 C) (Oral)   Ht  (1.778 m)   Wt 228 lb 2 oz (103.5 kg)   SpO2 93%   BMI 32.73 kg/m  General: Awake, alert, appears stated age HEENT: AT, [place], ears patent b/l and TM's neg, no sinus TTP, nares patent w/o discharge, pharynx pink and without exudates, MMM Neck: No masses or asymmetry Heart: RRR, no murmurs, no bruits Lungs: +expiratory wheezing diffusely, +transmitted upper  airway noises, mild wheezes heard is LL lung field, no accessory muscle use Psych: Age appropriate judgment and insight, normal mood and affect  Moderate persistent asthma with acute exacerbation - Plan: predniSONE (DELTASONE) 20 MG tablet  Orders as above.  Cont albuterol and Symbicort. Pred burst, f/u with Dr Drue Novel in 1 week, can coordinate f/u CXR for infiltrate. Continue to push fluids, practice good hand hygiene, cover mouth when coughing. F/u prn. If starting to experience fevers, shaking, or shortness of breath, seek immediate care. Pt voiced understanding and agreement to the plan.  Jilda Roche [place], DO 08/01/17 1:41 PM

## 2017-08-08 ENCOUNTER — Ambulatory Visit: Payer: PPO | Admitting: Internal Medicine

## 2017-08-08 DIAGNOSIS — Z0289 Encounter for other administrative examinations: Secondary | ICD-10-CM

## 2017-08-10 ENCOUNTER — Encounter: Payer: Self-pay | Admitting: Internal Medicine

## 2017-08-10 ENCOUNTER — Ambulatory Visit (INDEPENDENT_AMBULATORY_CARE_PROVIDER_SITE_OTHER): Payer: PPO | Admitting: Internal Medicine

## 2017-08-10 VITALS — BP 142/76 | HR 94 | Temp 97.7°F | Resp 14 | Ht 70.0 in | Wt 231.2 lb

## 2017-08-10 DIAGNOSIS — J189 Pneumonia, unspecified organism: Secondary | ICD-10-CM | POA: Diagnosis not present

## 2017-08-10 NOTE — Progress Notes (Signed)
Subjective:    Patient ID: Ryan Zhang, male    DOB: 1945/11/19, 71 y.o.   MRN: [id number]  DOS:  08/10/2017 Type of visit - description : ed f/u Interval history: Patient went to the ER 07/22/2017 w/ respiratory symptoms and eventually diagnosed with pneumonia. BMP was okay, white count slightly elevated, mild anemia,BNP and troponin negative, chest x-ray show LLL pnm. Receive an albuterol name, Rocephin and was discharged on Augmentin and doxycycline Was seen at this office 08/01/2017, was still symptomatic, he finished the abx and is better although not 100%. Still has some cough, worse at night, dry. Taking Robitussin   Review of Systems Since the last visit, no low-grade fever and overall feels he is improving Sinus pressure decreased No nausea, vomiting, diarrhea. No wheezing or chest pain.  Past Medical History:  Diagnosis Date  . Allergy    allergy shots Dr. Velora Heckler  . Asthma    moderate persistant  . Bipolar affective ([place])   . Diabetes mellitus   . DJD (degenerative joint disease)   . Dysphagia   . Gallstones    hx of, s/p cholecystectomy  . GERD (gastroesophageal reflux disease)   . Hepatitis A    as teenager 36's  . Hollenhorst plaque    right eye  . Hyperlipidemia   . Hypertension   . Kidney stones    hx of  . Meniere's disease   . Motility disorder, esophageal   . Obesity   . Pancreatitis   . Personal history of colonic polyps 11/2004   hyperplastic.  . Stroke [facility])    per MRI    Past Surgical History:  Procedure Laterality Date  . CATARACT EXTRACTION Bilateral 09/2015 and 10/2015  . CHOLECYSTECTOMY    . TOTAL KNEE ARTHROPLASTY Bilateral    both knees    Social History   Social History  . Marital status: Married    Spouse name: N/A  . Number of children: 1  . Years of education: N/A   Occupational History  . disabled Disabled    laboratory computing/programming   Social History Main Topics  . Smoking status: Former Smoker    Quit  date: 11/15/1964  . Smokeless tobacco: Never Used  . Alcohol use No  . Drug use: No  . Sexual activity: No   Other Topics Concern  . Not on file   Social History Narrative   Household-- pt , wife, adopted  daughter (h/o substance abuse,on a methadone program, doing better )      Allergies as of 08/10/2017      Reactions   Smz-tmp Ds [sulfamethoxazole-trimethoprim] Nausea And Vomiting   Divalproex Sodium Other (See Comments)   Pancreatitis    Methocarbamol Other (See Comments)   hallucinations   Other    "Symbrax" alteration in blood sugar, pt unsure if low or high blood sugar   Paroxetine Other (See Comments)   Insomnia   Percocet [oxycodone-acetaminophen] Other (See Comments)   Hyperactivity   Risperidone Other (See Comments)   REACTION: hyper   Wellbutrin [bupropion] Other (See Comments)   Hot flashes   Citalopram Hydrobromide Other (See Comments)   Insomnia      Medication List       Accurate as of 08/10/17  2:50 PM. Always use your most recent med list.          budesonide-formoterol 160-4.5 MCG/ACT inhaler Commonly known as:  SYMBICORT Inhale 2 puffs into the lungs 2 (two) times daily.   clonazePAM 0.5  MG tablet Commonly known as:  KLONOPIN Take 0.5 mg by mouth 3 (three) times daily. 1 mg in the morning and 0.5 mg in the evening.   clopidogrel 75 MG tablet Commonly known as:  PLAVIX Take 1 tablet (75 mg total) by mouth daily.   clotrimazole-betamethasone cream Commonly known as:  LOTRISONE Apply 1 application topically 2 (two) times daily.   CYMBALTA 60 MG capsule Generic drug:  DULoxetine Take 60 mg by mouth every morning.   EPIPEN 2-PAK 0.3 mg/0.3 mL Soaj injection Generic drug:  EPINEPHrine Reported on 04/29/2016   esomeprazole 40 MG capsule Commonly known as:  NEXIUM Take 40 mg by mouth daily.   fexofenadine 180 MG tablet Commonly known as:  ALLEGRA Take 180 mg by mouth every morning.   gabapentin 300 MG capsule Commonly known as:   NEURONTIN Take 300 mg by mouth at bedtime.   glucose blood test strip Commonly known as:  ONETOUCH VERIO Check blood sugars no more than twice daily   lamoTRIgine 25 MG tablet Commonly known as:  LAMICTAL Take 50 mg by mouth at bedtime.   losartan 25 MG tablet Commonly known as:  COZAAR Take 1 tablet (25 mg total) by mouth daily.   multivitamin tablet Take 1 tablet by mouth daily. Senior complex   Conservation officer, nature Check blood sugars no more than twice daily   pioglitazone-metformin 15-850 MG tablet Commonly known as:  ACTOPLUS MET Take 1 tablet by mouth 2 (two) times daily with a meal.   predniSONE 20 MG tablet Commonly known as:  DELTASONE Take 1 tablet (20 mg total) by mouth daily with breakfast.   SAPHRIS 5 MG Subl 24 hr tablet Generic drug:  asenapine Place 5 mg under the tongue at bedtime.   simvastatin 40 MG tablet Commonly known as:  ZOCOR Take 1 tablet (40 mg total) by mouth at bedtime.   traZODone 50 MG tablet Commonly known as:  DESYREL Take 150 mg by mouth at bedtime as needed for sleep.   VERAMYST 27.5 MCG/SPRAY nasal spray Generic drug:  fluticasone Place 2 sprays into the nose daily as needed.          Objective:   Physical Exam BP (!) 142/76 (BP Location: Left Arm, Patient Position: Sitting, Cuff Size: Small)   Pulse 94   Temp 97.7 F (36.5 C) (Oral)   Resp 14   Ht _0  (1.778 m)   Wt 231 lb 4 oz (104.9 kg)   SpO2 94%   BMI 33.18 kg/m  General:   Well developed, well nourished . NAD.  HEENT:  Normocephalic . Face symmetric, atraumatic Lungs:  Few rhonchi,L>R bases , no crackles Normal respiratory effort, no intercostal retractions, no accessory muscle use. Heart: RRR,  no murmur.  No pretibial edema bilaterally  Skin: Not pale. Not jaundice Neurologic:  alert & oriented X3.  Speech normal, gait appropriate for age and unassisted Psych--  Cognition and judgment appear intact.  Cooperative with normal attention span  and concentration.  Behavior appropriate. No anxious or depressed appearing.      Assessment & Plan:   Assessment DM HTN Hyperlipidemia Morbid obesity (BMI 36 plus DM) Bipolar, Depression ------ see psychiatry, Dr Casimiro Needle DJD-- hydrocodone rarely rx by [place] ortho Asthma-Allergies ------------ Dr [name]  GERD, h/o dysphagia (chronic, neurogenic-transfer dysphagia? See GI note 12-2011) Neuro: --? stroke : saw neuro 2013 d/t B transient visual loss, ASA changed to plavix.  --See CTA, MRIs of head-neck report  --Saw neuro 11-2013:  fall, syncope,parkinsonism d/t sapharis? CV: Enlarged ascending Ao per CT 12-2015,CT chest 08-2016 stable recheck q year  Nl LE arterial dopplers 2015 CT chest  07-2014----RML  64m 01-13-16  RML nodule >>> stable , benign; LLL linear scar >> f/u 6 months ; Enlarged Ascending Ao 3.9 cm: f/u yearly  08/19/2016: CT chest: Stable. Aorta about the same. HOH H/o Mnire's disease   PLAN: Pneumonia,LLL: DX at the ER 07/22/2017, status post Rocephin, prednisone, albuterol, Augmentin and doxycycline. Improving, exam is reassuring. Will get a CXR ~08/21/2017, call if improvement does not continue. See AVS.  RTC November

## 2017-08-10 NOTE — Patient Instructions (Signed)
Come  back to the first floor around 08-21-2017, get your chest XR  Call if not continue improving or if you have fever, chills , shortness of breath  Next visit in November

## 2017-08-10 NOTE — Progress Notes (Signed)
Pre visit review using our clinic review tool, if applicable. No additional management support is needed unless otherwise documented below in the visit note. 

## 2017-08-11 NOTE — Assessment & Plan Note (Signed)
Pneumonia,LLL: DX at the ER 07/22/2017, status post Rocephin, prednisone, albuterol, Augmentin and doxycycline. Improving, exam is reassuring. Will get a CXR ~08/21/2017, call if improvement does not continue. See AVS.  RTC November

## 2017-08-17 DIAGNOSIS — J301 Allergic rhinitis due to pollen: Secondary | ICD-10-CM | POA: Diagnosis not present

## 2017-08-17 DIAGNOSIS — J3089 Other allergic rhinitis: Secondary | ICD-10-CM | POA: Diagnosis not present

## 2017-08-22 ENCOUNTER — Ambulatory Visit (HOSPITAL_BASED_OUTPATIENT_CLINIC_OR_DEPARTMENT_OTHER)
Admission: RE | Admit: 2017-08-22 | Discharge: 2017-08-22 | Disposition: A | Discharge status: Home / Self Care | Payer: PPO | Source: Ambulatory Visit | Attending: Internal Medicine | Admitting: Internal Medicine

## 2017-08-22 DIAGNOSIS — J189 Pneumonia, unspecified organism: Secondary | ICD-10-CM | POA: Insufficient documentation

## 2017-08-22 DIAGNOSIS — R918 Other nonspecific abnormal finding of lung field: Secondary | ICD-10-CM | POA: Diagnosis not present

## 2017-08-24 DIAGNOSIS — J300 Vasomotor rhinitis: Secondary | ICD-10-CM | POA: Diagnosis not present

## 2017-08-24 DIAGNOSIS — J301 Allergic rhinitis due to pollen: Secondary | ICD-10-CM | POA: Diagnosis not present

## 2017-08-24 DIAGNOSIS — J4540 Moderate persistent asthma, uncomplicated: Secondary | ICD-10-CM | POA: Diagnosis not present

## 2017-08-24 DIAGNOSIS — J3089 Other allergic rhinitis: Secondary | ICD-10-CM | POA: Diagnosis not present

## 2017-08-25 DIAGNOSIS — F3175 Bipolar disorder, in partial remission, most recent episode depressed: Secondary | ICD-10-CM | POA: Diagnosis not present

## 2017-08-31 DIAGNOSIS — J301 Allergic rhinitis due to pollen: Secondary | ICD-10-CM | POA: Diagnosis not present

## 2017-08-31 DIAGNOSIS — J3089 Other allergic rhinitis: Secondary | ICD-10-CM | POA: Diagnosis not present

## 2017-09-01 DIAGNOSIS — H903 Sensorineural hearing loss, bilateral: Secondary | ICD-10-CM | POA: Diagnosis not present

## 2017-09-05 DIAGNOSIS — F3131 Bipolar disorder, current episode depressed, mild: Secondary | ICD-10-CM | POA: Diagnosis not present

## 2017-09-07 DIAGNOSIS — J301 Allergic rhinitis due to pollen: Secondary | ICD-10-CM | POA: Diagnosis not present

## 2017-09-07 DIAGNOSIS — J3089 Other allergic rhinitis: Secondary | ICD-10-CM | POA: Diagnosis not present

## 2017-09-14 DIAGNOSIS — J3089 Other allergic rhinitis: Secondary | ICD-10-CM | POA: Diagnosis not present

## 2017-09-14 DIAGNOSIS — J301 Allergic rhinitis due to pollen: Secondary | ICD-10-CM | POA: Diagnosis not present

## 2017-09-17 ENCOUNTER — Other Ambulatory Visit: Payer: Self-pay | Admitting: Internal Medicine

## 2017-09-19 ENCOUNTER — Other Ambulatory Visit: Payer: Self-pay | Admitting: Internal Medicine

## 2017-09-19 DIAGNOSIS — J301 Allergic rhinitis due to pollen: Secondary | ICD-10-CM | POA: Diagnosis not present

## 2017-09-19 DIAGNOSIS — J3089 Other allergic rhinitis: Secondary | ICD-10-CM | POA: Diagnosis not present

## 2017-09-21 DIAGNOSIS — J301 Allergic rhinitis due to pollen: Secondary | ICD-10-CM | POA: Diagnosis not present

## 2017-09-21 DIAGNOSIS — J3089 Other allergic rhinitis: Secondary | ICD-10-CM | POA: Diagnosis not present

## 2017-09-22 DIAGNOSIS — F3175 Bipolar disorder, in partial remission, most recent episode depressed: Secondary | ICD-10-CM | POA: Diagnosis not present

## 2017-09-28 DIAGNOSIS — J3089 Other allergic rhinitis: Secondary | ICD-10-CM | POA: Diagnosis not present

## 2017-09-28 DIAGNOSIS — J301 Allergic rhinitis due to pollen: Secondary | ICD-10-CM | POA: Diagnosis not present

## 2017-09-30 DIAGNOSIS — J301 Allergic rhinitis due to pollen: Secondary | ICD-10-CM | POA: Diagnosis not present

## 2017-09-30 DIAGNOSIS — J3089 Other allergic rhinitis: Secondary | ICD-10-CM | POA: Diagnosis not present

## 2017-10-01 ENCOUNTER — Other Ambulatory Visit: Payer: Self-pay | Admitting: Internal Medicine

## 2017-10-05 DIAGNOSIS — J3089 Other allergic rhinitis: Secondary | ICD-10-CM | POA: Diagnosis not present

## 2017-10-05 DIAGNOSIS — J301 Allergic rhinitis due to pollen: Secondary | ICD-10-CM | POA: Diagnosis not present

## 2017-10-10 ENCOUNTER — Encounter: Payer: Self-pay | Admitting: Internal Medicine

## 2017-10-10 ENCOUNTER — Ambulatory Visit (INDEPENDENT_AMBULATORY_CARE_PROVIDER_SITE_OTHER): Payer: PPO | Admitting: Internal Medicine

## 2017-10-10 VITALS — BP 128/68 | HR 75 | Temp 98.1°F | Resp 14 | Ht 69.5 in | Wt 220.1 lb

## 2017-10-10 DIAGNOSIS — Z0000 Encounter for general adult medical examination without abnormal findings: Secondary | ICD-10-CM | POA: Diagnosis not present

## 2017-10-10 DIAGNOSIS — E1142 Type 2 diabetes mellitus with diabetic polyneuropathy: Secondary | ICD-10-CM | POA: Diagnosis not present

## 2017-10-10 DIAGNOSIS — I779 Disorder of arteries and arterioles, unspecified: Secondary | ICD-10-CM

## 2017-10-10 DIAGNOSIS — Z09 Encounter for follow-up examination after completed treatment for conditions other than malignant neoplasm: Secondary | ICD-10-CM | POA: Diagnosis not present

## 2017-10-10 DIAGNOSIS — J3089 Other allergic rhinitis: Secondary | ICD-10-CM | POA: Diagnosis not present

## 2017-10-10 DIAGNOSIS — J301 Allergic rhinitis due to pollen: Secondary | ICD-10-CM | POA: Diagnosis not present

## 2017-10-10 LAB — CBC WITH DIFFERENTIAL/PLATELET
Basophils Absolute: 0.1 10*3/uL (ref 0.0–0.1)
Basophils Relative: 0.6 % (ref 0.0–3.0)
Eosinophils Absolute: 0.3 10*3/uL (ref 0.0–0.7)
Eosinophils Relative: 3.2 % (ref 0.0–5.0)
HCT: 39.1 % (ref 39.0–52.0)
Hemoglobin: 12.4 g/dL — ABNORMAL LOW (ref 13.0–17.0)
Lymphocytes Relative: 31.9 % (ref 12.0–46.0)
Lymphs Abs: 3 10*3/uL (ref 0.7–4.0)
MCHC: 31.8 g/dL (ref 30.0–36.0)
MCV: 81.8 fl (ref 78.0–100.0)
Monocytes Absolute: 0.6 10*3/uL (ref 0.1–1.0)
Monocytes Relative: 6.7 % (ref 3.0–12.0)
Neutro Abs: 5.3 10*3/uL (ref 1.4–7.7)
Neutrophils Relative %: 57.6 % (ref 43.0–77.0)
Platelets: 297 10*3/uL (ref 150.0–400.0)
RBC: 4.78 Mil/uL (ref 4.22–5.81)
RDW: 16.8 % — ABNORMAL HIGH (ref 11.5–15.5)
WBC: 9.3 10*3/uL (ref 4.0–10.5)

## 2017-10-10 LAB — LIPID PANEL
Cholesterol: 138 mg/dL (ref 0–200)
HDL: 48.9 mg/dL (ref >39.00)
LDL Cholesterol: 73 mg/dL (ref 0–99)
NonHDL: 88.86
Total CHOL/HDL Ratio: 3
Triglycerides: 77 mg/dL (ref 0.0–149.0)
VLDL: 15.4 mg/dL (ref 0.0–40.0)

## 2017-10-10 LAB — HEMOGLOBIN A1C: Hgb A1c MFr Bld: 6.3 % (ref 4.6–6.5)

## 2017-10-10 NOTE — Patient Instructions (Signed)
GO TO THE LAB : Get the blood work     GO TO THE FRONT DESK Schedule your next appointment for a  checkup in 4-5 months   

## 2017-10-10 NOTE — Assessment & Plan Note (Addendum)
DM with neuropathy: On Actos plus met, has changed his diet and lost weight. Check A1c.  Needs new diabetic shoes, will send me a request High cholesterol: On simvastatin, check FLP HTN: Seems well controlled on losartan.  Last BMP satisfactory Pneumonia.  See last visit, f/u CXR showed clearing of infiltrate.  Now has chronic cough most likely due to dysphagia which is a chronic issue. Weight loss: Most likely due to change in diet, the patient feels well.  Last TSH satisfactory. Enlarged mid ascending aorta (3.9 cm) per CT, CTA yearly is recommended.  Will discuss with cards: would a echo be ok (to avoid yearly contrast) . Addendum: Echo note recommended, could do a cardiac MRI w/o contrast alternate with CTs as an option.  Will do the cardiac MRI. RTC in 4-5 months

## 2017-10-10 NOTE — Progress Notes (Signed)
Pre visit review using our clinic review tool, if applicable. No additional management support is needed unless otherwise documented below in the visit note. 

## 2017-10-10 NOTE — Assessment & Plan Note (Addendum)
-  Td 2011; pnm shot 2015; prevnar 2016; zostavax 2013; s/p shingrex x 2 per pt;  had a flu shot  -CCS: Normal  Cscope 2011 -Prostate ca screening: DRE wnl 2017, PSA 04/2017 wnl -Diet-exercise : discussed he is doing very well with diet. - labs: FLP, CBC, A1c

## 2017-10-10 NOTE — Progress Notes (Addendum)
Subjective:    Patient ID: Ryan Zhang, male    DOB: Jan 27, 1946, 71 y.o.   MRN: [id number]  DOS:  10/10/2017 Type of visit - description : cpx Interval history: Has few concerns, see below  Wt Readings from Last 3 Encounters:  10/10/17 220 lb 2 oz (99.8 kg)  08/10/17 231 lb 4 oz (104.9 kg)  08/01/17 228 lb 2 oz (103.5 kg)    Review of Systems  Has on and off cough with fluid intake, a chronic issue, slightly above baseline?. Has lost some weight, reports that he has changed his diet that is not "eating junk". Occasionally heavy sweats but mostly with physical activity, no nocturnal symptoms.  No fever or chills.   Other than above, a 14 point review of systems is negative     Past Medical History:  Diagnosis Date  . Allergy    allergy shots Dr. Velora Heckler  . Asthma    moderate persistant  . Bipolar affective ([place])   . Diabetes mellitus   . DJD (degenerative joint disease)   . Dysphagia   . Gallstones    hx of, s/p cholecystectomy  . GERD (gastroesophageal reflux disease)   . Hepatitis A    as teenager 46's  . Hollenhorst plaque    right eye  . Hyperlipidemia   . Hypertension   . Kidney stones    hx of  . Meniere's disease   . Motility disorder, esophageal   . Obesity   . Pancreatitis   . Personal history of colonic polyps 11/2004   hyperplastic.  . Stroke [facility])    per MRI    Past Surgical History:  Procedure Laterality Date  . CATARACT EXTRACTION Bilateral 09/2015 and 10/2015  . CHOLECYSTECTOMY    . TOTAL KNEE ARTHROPLASTY Bilateral    both knees    Social History   Socioeconomic History  . Marital status: Married    Spouse name: Not on file  . Number of children: 1  . Years of education: Not on file  . Highest education level: Not on file  Social Needs  . Financial resource strain: Not on file  . Food insecurity - worry: Not on file  . Food insecurity - inability: Not on file  . Transportation needs - medical: Not on file  . Transportation  needs - non-medical: Not on file  Occupational History  . Occupation: disabled    Employer: DISABLED    Comment: laboratory computing/programming  Tobacco Use  . Smoking status: Former Smoker    Last attempt to quit: 11/15/1964    Years since quitting: 52.9  . Smokeless tobacco: Never Used  Substance and Sexual Activity  . Alcohol use: No  . Drug use: No  . Sexual activity: No  Other Topics Concern  . Not on file  Social History Narrative   Household-- pt , wife, adopted  daughter (h/o substance abuse,on a methadone program, doing better )     Family History  Problem Relation Age of Onset  . Diabetes Father   . Heart disease Brother   . Heart disease Maternal Grandmother   . Cancer Sister        unknown type  . Colon cancer Neg Hx   . Prostate cancer Neg Hx      Allergies as of 10/10/2017      Reactions   Smz-tmp Ds [sulfamethoxazole-trimethoprim] Nausea And Vomiting   Divalproex Sodium Other (See Comments)   Pancreatitis    Methocarbamol Other (See Comments)  hallucinations   Other    "Symbrax" alteration in blood sugar, pt unsure if low or high blood sugar   Paroxetine Other (See Comments)   Insomnia   Percocet [oxycodone-acetaminophen] Other (See Comments)   Hyperactivity   Risperidone Other (See Comments)   REACTION: hyper   Wellbutrin [bupropion] Other (See Comments)   Hot flashes   Citalopram Hydrobromide Other (See Comments)   Insomnia      Medication List        Accurate as of 10/10/17  5:33 PM. Always use your most recent med list.          budesonide-formoterol 160-4.5 MCG/ACT inhaler Commonly known as:  SYMBICORT Inhale 2 puffs into the lungs 2 (two) times daily.   clonazePAM 0.5 MG tablet Commonly known as:  KLONOPIN Take 0.5 mg by mouth 3 (three) times daily. 1 mg in the morning and 0.5 mg in the evening.   clopidogrel 75 MG tablet Commonly known as:  PLAVIX Take 1 tablet (75 mg total) daily by mouth.   clotrimazole-betamethasone  cream Commonly known as:  LOTRISONE Apply 1 application topically 2 (two) times daily.   CYMBALTA 60 MG capsule Generic drug:  DULoxetine Take 60 mg by mouth every morning.   EPIPEN 2-PAK 0.3 mg/0.3 mL Soaj injection Generic drug:  EPINEPHrine Reported on 04/29/2016   esomeprazole 40 MG capsule Commonly known as:  NEXIUM Take 40 mg by mouth daily.   fexofenadine 180 MG tablet Commonly known as:  ALLEGRA Take 180 mg by mouth every morning.   glucose blood test strip Commonly known as:  ONETOUCH VERIO Check blood sugars no more than twice daily   lamoTRIgine 25 MG tablet Commonly known as:  LAMICTAL Take 50 mg by mouth at bedtime.   losartan 25 MG tablet Commonly known as:  COZAAR Take 1 tablet (25 mg total) by mouth daily.   multivitamin tablet Take 1 tablet by mouth daily. Senior complex   Conservation officer, nature Check blood sugars no more than twice daily   pioglitazone-metformin 15-850 MG tablet Commonly known as:  ACTOPLUS MET Take 1 tablet 2 (two) times daily with a meal by mouth.   SAPHRIS 5 MG Subl 24 hr tablet Generic drug:  asenapine Place 5 mg under the tongue at bedtime.   simvastatin 40 MG tablet Commonly known as:  ZOCOR Take 1 tablet (40 mg total) at bedtime by mouth.   traZODone 50 MG tablet Commonly known as:  DESYREL Take 150 mg by mouth at bedtime as needed for sleep.   VERAMYST 27.5 MCG/SPRAY nasal spray Generic drug:  fluticasone Place 2 sprays into the nose daily as needed.          Objective:   Physical Exam BP 128/68 (BP Location: Left Arm, Patient Position: Sitting, Cuff Size: Normal)   Pulse 75   Temp 98.1 F (36.7 C) (Oral)   Resp 14   Ht 5' 9.5" (1.765 m) Comment: w/ shoes  Wt 220 lb 2 oz (99.8 kg)   SpO2 93%   BMI 32.04 kg/m  General:   Well developed, well nourished . NAD.  Neck: No  thyromegaly  HEENT:  Normocephalic . Face symmetric, atraumatic Lungs:  CTA B Normal respiratory effort, no intercostal  retractions, no accessory muscle use. Heart: RRR,  no murmur.  No pretibial edema bilaterally  Abdomen:  Not distended, soft, non-tender. No rebound or rigidity.   Skin: Exposed areas without rash. Not pale. Not jaundice Neurologic:  alert & oriented X3.  Speech normal, gait appropriate for age and unassisted Strength symmetric and appropriate for age.  Psych: Cognition and judgment appear intact.  Cooperative with normal attention span and concentration.  Behavior appropriate. No anxious or depressed appearing.     Assessment & Plan:   Assessment DM HTN Hyperlipidemia Morbid obesity (BMI 36 plus DM) Bipolar, Depression ------ see psychiatry, Dr Casimiro Needle DJD-- hydrocodone rarely rx by [place] ortho Asthma-Allergies ------------ Dr [name]  GERD, h/o dysphagia (chronic, neurogenic-transfer dysphagia? See GI note 12-2011) Neuro: --? stroke : saw neuro 2013 d/t B transient visual loss, ASA changed to plavix.  --See CTA, MRIs of head-neck report  --Saw neuro 11-2013: fall, syncope,parkinsonism d/t sapharis? CV: Enlarged ascending Ao per CT 12-2015,CT chest 08-2016 stable recheck q year  Nl LE arterial dopplers 2015 Carotid artery disease: Per ultrasound 2011, last [place] 2018: <50% B, rx medical treatment, recheck 2 years  -Ultrasound 2011 CT chest  RML  2m: 07-2014, CT 12-2015, CT 08-2016: Stable, no further eval. Enlarged Ascending Ao 3.9 cm: f/u yearly.  Last CT 08-2016.  Stable  HOH H/o Mnire's disease   PLAN:  DM with neuropathy: On Actos plus met, has changed his diet and lost weight. Check A1c.  Needs new diabetic shoes, will send me a request High cholesterol: On simvastatin, check FLP HTN: Seems well controlled on losartan.  Last BMP satisfactory Pneumonia.  See last visit, f/u CXR showed clearing of infiltrate.  Now has chronic cough most likely due to dysphagia which is a chronic issue. Weight loss: Most likely due to change in diet, the patient feels well.  Last TSH  satisfactory. Enlarged mid ascending aorta (3.9 cm) per CT, CTA yearly is recommended.  Will discuss with cards: would a echo be ok (to avoid yearly contrast) . Addendum: Echo note recommended, could do a cardiac MRI w/o contrast alternate with CTs as an option.  Will do the cardiac MRI. RTC in 4-5 months

## 2017-10-12 DIAGNOSIS — J3089 Other allergic rhinitis: Secondary | ICD-10-CM | POA: Diagnosis not present

## 2017-10-12 DIAGNOSIS — J301 Allergic rhinitis due to pollen: Secondary | ICD-10-CM | POA: Diagnosis not present

## 2017-10-19 DIAGNOSIS — J3081 Allergic rhinitis due to animal (cat) (dog) hair and dander: Secondary | ICD-10-CM | POA: Diagnosis not present

## 2017-10-19 DIAGNOSIS — J3089 Other allergic rhinitis: Secondary | ICD-10-CM | POA: Diagnosis not present

## 2017-10-19 DIAGNOSIS — F3175 Bipolar disorder, in partial remission, most recent episode depressed: Secondary | ICD-10-CM | POA: Diagnosis not present

## 2017-10-19 DIAGNOSIS — J301 Allergic rhinitis due to pollen: Secondary | ICD-10-CM | POA: Diagnosis not present

## 2017-10-21 ENCOUNTER — Other Ambulatory Visit: Payer: Self-pay | Admitting: *Deleted

## 2017-10-21 DIAGNOSIS — I779 Disorder of arteries and arterioles, unspecified: Secondary | ICD-10-CM

## 2017-10-21 NOTE — Addendum Note (Signed)
Addended by: Willow OraPAZ, Daundre Biel E on: 10/21/2017 09:24 AM   Modules accepted: Orders

## 2017-10-26 DIAGNOSIS — J3089 Other allergic rhinitis: Secondary | ICD-10-CM | POA: Diagnosis not present

## 2017-10-26 DIAGNOSIS — J301 Allergic rhinitis due to pollen: Secondary | ICD-10-CM | POA: Diagnosis not present

## 2017-10-28 ENCOUNTER — Telehealth: Payer: Self-pay | Admitting: Internal Medicine

## 2017-10-28 NOTE — Telephone Encounter (Signed)
Spoke with the patient and the wife in reference to his enlarge aorta and the need to follow-up.  They agreed to proceed with a MRI of the chest. [name]:  please verify that the pt's insurance is okay with the location where he is going to have the MRI, the patient's wife is somewhat concerned about it

## 2017-10-28 NOTE — Telephone Encounter (Signed)
FYI

## 2017-10-28 NOTE — Telephone Encounter (Signed)
Copied from CRM [phone]. Topic: Quick Communication - See Telephone Encounter >> Oct 28, 2017  3:11 PM Cipriano BunkerLambe, Annette S wrote: CRM for notification. See Telephone encounter for:  Wife, Jasmine DecemberSharon, has a lot of questions regarding this Aorta finding and MRI etc. Please have doctor to call and clarify.  10/28/17.

## 2017-10-31 ENCOUNTER — Telehealth: Payer: Self-pay | Admitting: Internal Medicine

## 2017-10-31 NOTE — Telephone Encounter (Signed)
Copied from CRM #[id number]. Topic: Quick Communication - See Telephone Encounter >> Oct 31, 2017  8:58 AM Ryan Zhang, Ryan Zhang wrote: CRM for notification. See Telephone encounter for:  [facility] pt is scheduled for an MRI w/o chest and it needs to be an MRA w/ and w/o chest. Pt is scheduled for Friday. [phone] ([facility]  10/31/17.

## 2017-10-31 NOTE — Telephone Encounter (Signed)
FYI

## 2017-11-01 NOTE — Telephone Encounter (Signed)
Noted, unfortunately I cannot do anything about his co-pay.

## 2017-11-01 NOTE — Telephone Encounter (Signed)
Patients insurance ok, however there is a $200 co-pay for the patient

## 2017-11-01 NOTE — Telephone Encounter (Signed)
Advise radiology: I already discussed this issue with cardiology, will proceed with MRI of the chest without contrast.

## 2017-11-01 NOTE — Telephone Encounter (Signed)
Tried calling callback number given below- "call could not be completed as dialed."

## 2017-11-02 DIAGNOSIS — J301 Allergic rhinitis due to pollen: Secondary | ICD-10-CM | POA: Diagnosis not present

## 2017-11-02 DIAGNOSIS — J3089 Other allergic rhinitis: Secondary | ICD-10-CM | POA: Diagnosis not present

## 2017-11-05 ENCOUNTER — Ambulatory Visit
Admission: RE | Admit: 2017-11-05 | Discharge: 2017-11-05 | Disposition: A | Discharge status: Home / Self Care | Payer: PPO | Source: Ambulatory Visit | Attending: Internal Medicine | Admitting: Internal Medicine

## 2017-11-05 ENCOUNTER — Other Ambulatory Visit (HOSPITAL_COMMUNITY): Payer: Self-pay | Admitting: Psychiatry

## 2017-11-05 DIAGNOSIS — I779 Disorder of arteries and arterioles, unspecified: Secondary | ICD-10-CM

## 2017-11-05 DIAGNOSIS — I712 Thoracic aortic aneurysm, without rupture: Secondary | ICD-10-CM | POA: Diagnosis not present

## 2017-11-09 DIAGNOSIS — J3089 Other allergic rhinitis: Secondary | ICD-10-CM | POA: Diagnosis not present

## 2017-11-09 DIAGNOSIS — J301 Allergic rhinitis due to pollen: Secondary | ICD-10-CM | POA: Diagnosis not present

## 2017-11-16 DIAGNOSIS — J3081 Allergic rhinitis due to animal (cat) (dog) hair and dander: Secondary | ICD-10-CM | POA: Diagnosis not present

## 2017-11-16 DIAGNOSIS — J301 Allergic rhinitis due to pollen: Secondary | ICD-10-CM | POA: Diagnosis not present

## 2017-11-16 DIAGNOSIS — J3089 Other allergic rhinitis: Secondary | ICD-10-CM | POA: Diagnosis not present

## 2017-11-23 DIAGNOSIS — J301 Allergic rhinitis due to pollen: Secondary | ICD-10-CM | POA: Diagnosis not present

## 2017-11-23 DIAGNOSIS — J3089 Other allergic rhinitis: Secondary | ICD-10-CM | POA: Diagnosis not present

## 2017-11-30 DIAGNOSIS — J301 Allergic rhinitis due to pollen: Secondary | ICD-10-CM | POA: Diagnosis not present

## 2017-11-30 DIAGNOSIS — J3089 Other allergic rhinitis: Secondary | ICD-10-CM | POA: Diagnosis not present

## 2017-12-07 DIAGNOSIS — J3089 Other allergic rhinitis: Secondary | ICD-10-CM | POA: Diagnosis not present

## 2017-12-07 DIAGNOSIS — J301 Allergic rhinitis due to pollen: Secondary | ICD-10-CM | POA: Diagnosis not present

## 2017-12-07 DIAGNOSIS — J3081 Allergic rhinitis due to animal (cat) (dog) hair and dander: Secondary | ICD-10-CM | POA: Diagnosis not present

## 2017-12-11 ENCOUNTER — Other Ambulatory Visit: Payer: Self-pay | Admitting: Internal Medicine

## 2017-12-14 DIAGNOSIS — J301 Allergic rhinitis due to pollen: Secondary | ICD-10-CM | POA: Diagnosis not present

## 2017-12-14 DIAGNOSIS — J3089 Other allergic rhinitis: Secondary | ICD-10-CM | POA: Diagnosis not present

## 2017-12-21 DIAGNOSIS — J301 Allergic rhinitis due to pollen: Secondary | ICD-10-CM | POA: Diagnosis not present

## 2017-12-21 DIAGNOSIS — J3089 Other allergic rhinitis: Secondary | ICD-10-CM | POA: Diagnosis not present

## 2017-12-28 DIAGNOSIS — J301 Allergic rhinitis due to pollen: Secondary | ICD-10-CM | POA: Diagnosis not present

## 2017-12-28 DIAGNOSIS — J3089 Other allergic rhinitis: Secondary | ICD-10-CM | POA: Diagnosis not present

## 2018-01-04 DIAGNOSIS — J3089 Other allergic rhinitis: Secondary | ICD-10-CM | POA: Diagnosis not present

## 2018-01-04 DIAGNOSIS — J301 Allergic rhinitis due to pollen: Secondary | ICD-10-CM | POA: Diagnosis not present

## 2018-01-15 ENCOUNTER — Other Ambulatory Visit: Payer: Self-pay | Admitting: Internal Medicine

## 2018-01-17 DIAGNOSIS — J4540 Moderate persistent asthma, uncomplicated: Secondary | ICD-10-CM | POA: Diagnosis not present

## 2018-01-17 DIAGNOSIS — J3089 Other allergic rhinitis: Secondary | ICD-10-CM | POA: Diagnosis not present

## 2018-01-17 DIAGNOSIS — J209 Acute bronchitis, unspecified: Secondary | ICD-10-CM | POA: Diagnosis not present

## 2018-01-17 DIAGNOSIS — J300 Vasomotor rhinitis: Secondary | ICD-10-CM | POA: Diagnosis not present

## 2018-01-24 ENCOUNTER — Telehealth: Payer: Self-pay | Admitting: Internal Medicine

## 2018-01-24 ENCOUNTER — Encounter: Payer: Self-pay | Admitting: Internal Medicine

## 2018-01-24 ENCOUNTER — Ambulatory Visit (INDEPENDENT_AMBULATORY_CARE_PROVIDER_SITE_OTHER): Payer: Medicare Other | Admitting: Internal Medicine

## 2018-01-24 ENCOUNTER — Ambulatory Visit (HOSPITAL_BASED_OUTPATIENT_CLINIC_OR_DEPARTMENT_OTHER)
Admission: RE | Admit: 2018-01-24 | Discharge: 2018-01-24 | Disposition: A | Discharge status: Home / Self Care | Payer: Medicare Other | Source: Ambulatory Visit | Attending: Internal Medicine | Admitting: Internal Medicine

## 2018-01-24 VITALS — BP 136/84 | HR 93 | Temp 98.2°F | Resp 18 | Ht 69.5 in | Wt 217.1 lb

## 2018-01-24 DIAGNOSIS — J984 Other disorders of lung: Secondary | ICD-10-CM | POA: Insufficient documentation

## 2018-01-24 DIAGNOSIS — R062 Wheezing: Secondary | ICD-10-CM

## 2018-01-24 DIAGNOSIS — R05 Cough: Secondary | ICD-10-CM

## 2018-01-24 DIAGNOSIS — R0602 Shortness of breath: Secondary | ICD-10-CM | POA: Diagnosis not present

## 2018-01-24 DIAGNOSIS — I517 Cardiomegaly: Secondary | ICD-10-CM | POA: Diagnosis not present

## 2018-01-24 DIAGNOSIS — R059 Cough, unspecified: Secondary | ICD-10-CM

## 2018-01-24 DIAGNOSIS — J45901 Unspecified asthma with (acute) exacerbation: Secondary | ICD-10-CM | POA: Diagnosis not present

## 2018-01-24 DIAGNOSIS — J40 Bronchitis, not specified as acute or chronic: Secondary | ICD-10-CM

## 2018-01-24 MED ORDER — ALBUTEROL SULFATE (2.5 MG/3ML) 0.083% IN NEBU: 2.5000 mg | INHALATION_SOLUTION | Freq: Four times a day (QID) | RESPIRATORY_TRACT | 1 refills | Status: DC | PRN

## 2018-01-24 MED ORDER — DOXYCYCLINE HYCLATE 100 MG PO TABS: 100.0000 mg | ORAL_TABLET | Freq: Two times a day (BID) | ORAL | 0 refills | Status: DC

## 2018-01-24 MED ORDER — PREDNISONE 10 MG PO TABS
ORAL_TABLET | ORAL | 0 refills | Status: DC
Start: 2018-01-24 — End: 2018-02-09

## 2018-01-24 MED ORDER — ALBUTEROL SULFATE HFA 108 (90 BASE) MCG/ACT IN AERS: 2.0000 | INHALATION_SPRAY | Freq: Four times a day (QID) | RESPIRATORY_TRACT | 5 refills | Status: DC | PRN

## 2018-01-24 NOTE — Progress Notes (Signed)
Pre visit review using our clinic review tool, if applicable. No additional management support is needed unless otherwise documented below in the visit note. 

## 2018-01-24 NOTE — Patient Instructions (Addendum)
Please get a chest x-ray  Rest, fluids , tylenol  For cough:  Take Mucinex DM twice a day as needed until better  Avoid decongestants such as  Pseudoephedrine or phenylephrine   Take prednisone as prescribed  Take the antibiotic as prescribed, doxycycline  For wheezing: Use albuterol nebulizations every 6 hours, or albuterol inhaler.  Call if not gradually better over the next  10 days  Call anytime if the symptoms are severe

## 2018-01-24 NOTE — Telephone Encounter (Signed)
Copied from CRM [phone]. Topic: Quick Communication - See Telephone Encounter >> Jan 24, 2018  2:12 PM Everardo PacificMoton, Kilian Schwartz, [place] wrote: CRM for notification. Judithann Saugerorsha is calling from the pharmacy because she would like to know if the patients Albuterol quantity can be changed from 150 to 180 due to the fact that they distribute the Albuterol in a 180 quantity. If someone could give her a call back about this at [phone] 01/24/18.

## 2018-01-24 NOTE — Telephone Encounter (Signed)
PLAN:  Bronchitis with acute asthma exacerbation: The patient reports shortness of breath, vital signs are stable, nontoxic-appearing. Recommend to get a chest x-ray. His allergist already provided a round of prednisone, will prescribe a second round. Also doxycycline, Mucinex DM, albuterol every 6 hours by nebulization or inhaler.  He is already on Symbicort. Call if not gradually better. See AVS     Albuterol nebulizer and inhaler sent to [facility].

## 2018-01-24 NOTE — Progress Notes (Signed)
Subjective:    Patient ID: Ryan Zhang, male    DOB: 04-Jun-1946, 72 y.o.   MRN: [id number]  DOS:  01/24/2018 Type of visit - description : Acute visit Interval history: Symptoms started 3 weeks ago with cough, runny nose, malaise.  About a week ago went to see his allergist, receive cortisone IM injection on prednisone taper. While on prednisone by mouth he felt better but now symptoms are coming back: Cough, wheezing, malaise, shortness of breath. He has been using his medications including albuterol as prescribed   Review of Systems Denies any fevers Occasionally brings clear sputum. No nausea or vomiting, had some diarrhea. Denies chest pain other than anterior discomfort associated with cough No sinus pain, mild runny nose present.   Past Medical History:  Diagnosis Date  . Allergy    allergy shots Dr. Velora Heckler  . Asthma    moderate persistant  . Bipolar affective ([place])   . Diabetes mellitus   . DJD (degenerative joint disease)   . Dysphagia   . Gallstones    hx of, s/p cholecystectomy  . GERD (gastroesophageal reflux disease)   . Hepatitis A    as teenager 16's  . Hollenhorst plaque    right eye  . Hyperlipidemia   . Hypertension   . Kidney stones    hx of  . Meniere's disease   . Motility disorder, esophageal   . Obesity   . Pancreatitis   . Personal history of colonic polyps 11/2004   hyperplastic.  . Stroke [facility])    per MRI    Past Surgical History:  Procedure Laterality Date  . CATARACT EXTRACTION Bilateral 09/2015 and 10/2015  . CHOLECYSTECTOMY    . TOTAL KNEE ARTHROPLASTY Bilateral    both knees    Social History   Socioeconomic History  . Marital status: Married    Spouse name: Not on file  . Number of children: 1  . Years of education: Not on file  . Highest education level: Not on file  Social Needs  . Financial resource strain: Not on file  . Food insecurity - worry: Not on file  . Food insecurity - inability: Not on file  .  Transportation needs - medical: Not on file  . Transportation needs - non-medical: Not on file  Occupational History  . Occupation: disabled    Employer: DISABLED    Comment: laboratory computing/programming  Tobacco Use  . Smoking status: Former Smoker    Last attempt to quit: 11/15/1964    Years since quitting: 53.2  . Smokeless tobacco: Never Used  Substance and Sexual Activity  . Alcohol use: No  . Drug use: No  . Sexual activity: No  Other Topics Concern  . Not on file  Social History Narrative   Household-- pt , wife, adopted  daughter (h/o substance abuse,on a methadone program, doing better )      Allergies as of 01/24/2018      Reactions   Smz-tmp Ds [sulfamethoxazole-trimethoprim] Nausea And Vomiting   Divalproex Sodium Other (See Comments)   Pancreatitis    Methocarbamol Other (See Comments)   hallucinations   Other    "Symbrax" alteration in blood sugar, pt unsure if low or high blood sugar   Paroxetine Other (See Comments)   Insomnia   Percocet [oxycodone-acetaminophen] Other (See Comments)   Hyperactivity   Risperidone Other (See Comments)   REACTION: hyper   Wellbutrin [bupropion] Other (See Comments)   Hot flashes   Citalopram Hydrobromide  Other (See Comments)   Insomnia      Medication List        Accurate as of 01/24/18 11:59 PM. Always use your most recent med list.          albuterol 108 (90 Base) MCG/ACT inhaler Commonly known as:  PROVENTIL HFA;VENTOLIN HFA Inhale 2 puffs into the lungs every 6 (six) hours as needed for wheezing or shortness of breath.   albuterol (2.5 MG/3ML) 0.083% nebulizer solution Commonly known as:  PROVENTIL Take 3 mLs (2.5 mg total) by nebulization every 6 (six) hours as needed for wheezing or shortness of breath.   budesonide-formoterol 160-4.5 MCG/ACT inhaler Commonly known as:  SYMBICORT Inhale 2 puffs into the lungs 2 (two) times daily.   clonazePAM 0.5 MG tablet Commonly known as:  KLONOPIN Take 0.5 mg by  mouth 3 (three) times daily. 1 mg in the morning and 0.5 mg in the evening.   clopidogrel 75 MG tablet Commonly known as:  PLAVIX Take 1 tablet (75 mg total) daily by mouth.   clotrimazole-betamethasone cream Commonly known as:  LOTRISONE Apply 1 application topically 2 (two) times daily.   CYMBALTA 60 MG capsule Generic drug:  DULoxetine Take 60 mg by mouth every morning.   doxycycline 100 MG tablet Commonly known as:  VIBRA-TABS Take 1 tablet (100 mg total) by mouth 2 (two) times daily.   EPIPEN 2-PAK 0.3 mg/0.3 mL Soaj injection Generic drug:  EPINEPHrine Reported on 04/29/2016   esomeprazole 40 MG capsule Commonly known as:  NEXIUM Take 40 mg by mouth daily.   fexofenadine 180 MG tablet Commonly known as:  ALLEGRA Take 180 mg by mouth every morning.   glucose blood test strip Commonly known as:  ONETOUCH VERIO Check blood sugar no more than twice daily   lamoTRIgine 25 MG tablet Commonly known as:  LAMICTAL Take 50 mg by mouth at bedtime.   losartan 25 MG tablet Commonly known as:  COZAAR Take 1 tablet (25 mg total) by mouth daily.   multivitamin tablet Take 1 tablet by mouth daily. Senior complex   Conservation officer, nature Check blood sugars no more than twice daily   pioglitazone-metformin 15-850 MG tablet Commonly known as:  ACTOPLUS MET Take 1 tablet by mouth 2 (two) times daily with a meal.   predniSONE 10 MG tablet Commonly known as:  DELTASONE 4 tablets x 2 days, 3 tabs x 2 days, 2 tabs x 2 days, 1 tab x 2 days   SAPHRIS 5 MG Subl 24 hr tablet Generic drug:  asenapine Place 5 mg under the tongue at bedtime.   simvastatin 40 MG tablet Commonly known as:  ZOCOR Take 1 tablet (40 mg total) at bedtime by mouth.   traZODone 50 MG tablet Commonly known as:  DESYREL Take 150 mg by mouth at bedtime as needed for sleep.   VERAMYST 27.5 MCG/SPRAY nasal spray Generic drug:  fluticasone Place 2 sprays into the nose daily as needed.            Objective:   Physical Exam BP 136/84 (BP Location: Left Arm, Patient Position: Sitting, Cuff Size: Normal)   Pulse 93   Temp 98.2 F (36.8 C) (Oral)   Resp 18   Ht 5' 9.5" (1.765 m)   Wt 217 lb 2 oz (98.5 kg)   SpO2 97%   BMI 31.60 kg/m  General:   Well developed, well nourished.  Nontoxic appearing, frequent cough noted, looks tired but not in acute distress HEENT:  Normocephalic . Face symmetric, atraumatic.  TMs normal, throat symmetrical not red, nose not congested, sinuses non-TTP. Lungs:  Frequent cough noted, few rhonchi, increased expiratory time, no actual wheezing or crackles Normal respiratory effort, no intercostal retractions, no accessory muscle use. Heart: RRR,  no murmur.  No pretibial edema bilaterally  Skin: Not pale. Not jaundice Neurologic:  alert & oriented X3.  Speech normal, gait appropriate for age and unassisted Psych--  Cognition and judgment appear intact.  Cooperative with normal attention span and concentration.  Behavior appropriate. No anxious or depressed appearing.      Assessment & Plan:    Assessment DM HTN Hyperlipidemia Morbid obesity (BMI 36 plus DM) Bipolar, Depression ------ see psychiatry, Dr Casimiro Needle DJD-- hydrocodone rarely rx by [place] ortho Asthma-Allergies ------------ Dr [name]  GERD, h/o dysphagia (chronic, neurogenic-transfer dysphagia? See GI note 12-2011) Neuro: --? stroke : saw neuro 2013 d/t B transient visual loss, ASA changed to plavix.  --See CTA, MRIs of head-neck report  --Saw neuro 11-2013: fall, syncope,parkinsonism d/t sapharis? CV: Enlarged ascending Ao per CT 12-2015,CT chest 08-2016 stable recheck q year  Nl LE arterial dopplers 2015 Carotid artery disease: Per ultrasound 2011, last [place] 2018: <50% B, rx medical treatment, recheck 2 years  -Ultrasound 2011 CT chest  RML  54m: 07-2014, CT 12-2015, CT 08-2016: Stable, no further eval. Enlarged Ascending Ao 3.9 cm: f/u yearly.  Last CT 08-2016.  Stable   MRI chest w/o 10/2017: stable aorta, no further routine x-rays recommended.  RML  nodule is stable as well HOH H/o Mnire's disease   PLAN:  Bronchitis with acute asthma exacerbation: The patient reports SOB, VSS, nontoxic-appearing. Recommend to get a chest x-ray. His allergist already provided a round of prednisone, will prescribe a second round. Also doxycycline, Mucinex DM, albuterol every 6 hours by nebulization or inhaler.  He is already on Symbicort. Call if not gradually better. See AVS

## 2018-01-24 NOTE — Telephone Encounter (Signed)
Rx resent for quantity of 180 and 1 refill.

## 2018-01-24 NOTE — Telephone Encounter (Signed)
Copied from CRM [id number]. Topic: Quick Communication - See Telephone Encounter >> Jan 24, 2018  1:06 PM Eston Mouldavis, Claire Dolores B wrote: CRM for notification. See Telephone encounter for:  PT called to say [facility]  has not received albuterol rx  [facility] [id number] - , [place] - [address] [phone] (Phone) [phone] (Fax)    01/24/18.

## 2018-01-25 NOTE — Assessment & Plan Note (Signed)
Bronchitis with acute asthma exacerbation: The patient reports SOB, VSS, nontoxic-appearing. Recommend to get a chest x-ray. His allergist already provided a round of prednisone, will prescribe a second round. Also doxycycline, Mucinex DM, albuterol every 6 hours by nebulization or inhaler.  He is already on Symbicort. Call if not gradually better. See AVS

## 2018-02-06 DIAGNOSIS — J301 Allergic rhinitis due to pollen: Secondary | ICD-10-CM | POA: Diagnosis not present

## 2018-02-06 DIAGNOSIS — J3089 Other allergic rhinitis: Secondary | ICD-10-CM | POA: Diagnosis not present

## 2018-02-09 ENCOUNTER — Ambulatory Visit: Payer: Medicare Other | Admitting: Internal Medicine

## 2018-02-09 ENCOUNTER — Encounter: Payer: Self-pay | Admitting: Internal Medicine

## 2018-02-09 VITALS — BP 128/78 | HR 70 | Temp 98.2°F | Resp 16 | Ht 70.0 in | Wt 228.0 lb

## 2018-02-09 DIAGNOSIS — I10 Essential (primary) hypertension: Secondary | ICD-10-CM | POA: Diagnosis not present

## 2018-02-09 DIAGNOSIS — E1142 Type 2 diabetes mellitus with diabetic polyneuropathy: Secondary | ICD-10-CM | POA: Diagnosis not present

## 2018-02-09 DIAGNOSIS — D649 Anemia, unspecified: Secondary | ICD-10-CM

## 2018-02-09 LAB — BASIC METABOLIC PANEL
BUN: 23 mg/dL (ref 6–23)
CO2: 28 mEq/L (ref 19–32)
Calcium: 9 mg/dL (ref 8.4–10.5)
Chloride: 106 mEq/L (ref 96–112)
Creatinine, Ser: 1.02 mg/dL (ref 0.40–1.50)
GFR: 76.81 mL/min (ref >60.00)
Glucose, Bld: 136 mg/dL — ABNORMAL HIGH (ref 70–99)
Potassium: 4.5 mEq/L (ref 3.5–5.1)
Sodium: 141 mEq/L (ref 135–145)

## 2018-02-09 LAB — IRON: Iron: 49 ug/dL (ref 42–165)

## 2018-02-09 LAB — FERRITIN: Ferritin: 18.2 ng/mL — ABNORMAL LOW (ref 22.0–322.0)

## 2018-02-09 LAB — B12 AND FOLATE PANEL
Folate: >23.6 ng/mL (ref >5.9)
Vitamin B-12: 450 pg/mL (ref 211–911)

## 2018-02-09 LAB — HEMOGLOBIN A1C: Hgb A1c MFr Bld: 6.7 % — ABNORMAL HIGH (ref 4.6–6.5)

## 2018-02-09 NOTE — Progress Notes (Signed)
Pre visit review using our clinic review tool, if applicable. No additional management support is needed unless otherwise documented below in the visit note. 

## 2018-02-09 NOTE — Patient Instructions (Signed)
GO TO THE LAB : Get the blood work     GO TO THE FRONT DESK Schedule your next appointment for a  routine checkup in 6 months  

## 2018-02-09 NOTE — Progress Notes (Signed)
Subjective:    Patient ID: Ryan Zhang, male    DOB: [birth year], 72 y.o.   MRN: [id number]  DOS:  02/09/2018 Type of visit - description : Routine visit Interval history: General feeling well DM,  ambulatory CBGs in the 130s. HTN: Good ambulatory BPs. Had a chest MRI, results reviewed with the patient. Noted mild anemia on chart review, had a previous normal colonoscopy.   Review of Systems Denies fever chills.  No cough, difficulty breathing or weight loss No nausea, vomiting, diarrhea.  No blood in the stools. No GERD symptoms   Past Medical History:  Diagnosis Date  . Allergy    allergy shots Dr. Velora Heckler  . Asthma    moderate persistant  . Bipolar affective ([place])   . Diabetes mellitus   . DJD (degenerative joint disease)   . Dysphagia   . Gallstones    hx of, s/p cholecystectomy  . GERD (gastroesophageal reflux disease)   . Hepatitis A    as teenager 61's  . Hollenhorst plaque    right eye  . Hyperlipidemia   . Hypertension   . Kidney stones    hx of  . Meniere's disease   . Motility disorder, esophageal   . Obesity   . Pancreatitis   . Personal history of colonic polyps 11/2004   hyperplastic.  . Stroke [facility])    per MRI    Past Surgical History:  Procedure Laterality Date  . CATARACT EXTRACTION Bilateral 09/2015 and 10/2015  . CHOLECYSTECTOMY    . TOTAL KNEE ARTHROPLASTY Bilateral    both knees    Social History   Socioeconomic History  . Marital status: Married    Spouse name: Not on file  . Number of children: 1  . Years of education: Not on file  . Highest education level: Not on file  Occupational History  . Occupation: disabled    Employer: DISABLED    Comment: laboratory computing/programming  Social Needs  . Financial resource strain: Not on file  . Food insecurity:    Worry: Not on file    Inability: Not on file  . Transportation needs:    Medical: Not on file    Non-medical: Not on file  Tobacco Use  . Smoking status:  Former Smoker    Last attempt to quit: 11/15/1964    Years since quitting: 53.2  . Smokeless tobacco: Never Used  Substance and Sexual Activity  . Alcohol use: No  . Drug use: No  . Sexual activity: Never  Lifestyle  . Physical activity:    Days per week: Not on file    Minutes per session: Not on file  . Stress: Not on file  Relationships  . Social connections:    Talks on phone: Not on file    Gets together: Not on file    Attends religious service: Not on file    Active member of club or organization: Not on file    Attends meetings of clubs or organizations: Not on file    Relationship status: Not on file  . Intimate partner violence:    Fear of current or ex partner: Not on file    Emotionally abused: Not on file    Physically abused: Not on file    Forced sexual activity: Not on file  Other Topics Concern  . Not on file  Social History Narrative   Household-- pt , wife, adopted  daughter (h/o substance abuse,on a methadone program, doing better )  Allergies as of 02/09/2018      Reactions   Smz-tmp Ds [sulfamethoxazole-trimethoprim] Nausea And Vomiting   Divalproex Sodium Other (See Comments)   Pancreatitis    Methocarbamol Other (See Comments)   hallucinations   Other    "Symbrax" alteration in blood sugar, pt unsure if low or high blood sugar   Paroxetine Other (See Comments)   Insomnia   Percocet [oxycodone-acetaminophen] Other (See Comments)   Hyperactivity   Risperidone Other (See Comments)   REACTION: hyper   Wellbutrin [bupropion] Other (See Comments)   Hot flashes   Citalopram Hydrobromide Other (See Comments)   Insomnia      Medication List        Accurate as of 02/09/18 11:59 PM. Always use your most recent med list.          albuterol 108 (90 Base) MCG/ACT inhaler Commonly known as:  PROVENTIL HFA;VENTOLIN HFA Inhale 2 puffs into the lungs every 6 (six) hours as needed for wheezing or shortness of breath.   albuterol (2.5 MG/3ML)  0.083% nebulizer solution Commonly known as:  PROVENTIL Take 3 mLs (2.5 mg total) by nebulization every 6 (six) hours as needed for wheezing or shortness of breath.   budesonide-formoterol 160-4.5 MCG/ACT inhaler Commonly known as:  SYMBICORT Inhale 2 puffs into the lungs 2 (two) times daily.   clonazePAM 0.5 MG tablet Commonly known as:  KLONOPIN Take 0.5 mg by mouth 3 (three) times daily. 1 mg in the morning and 0.5 mg in the evening.   clopidogrel 75 MG tablet Commonly known as:  PLAVIX Take 1 tablet (75 mg total) daily by mouth.   clotrimazole-betamethasone cream Commonly known as:  LOTRISONE Apply 1 application topically 2 (two) times daily.   CYMBALTA 60 MG capsule Generic drug:  DULoxetine Take 60 mg by mouth every morning.   EPIPEN 2-PAK 0.3 mg/0.3 mL Soaj injection Generic drug:  EPINEPHrine Reported on 04/29/2016   esomeprazole 40 MG capsule Commonly known as:  NEXIUM Take 40 mg by mouth daily.   fexofenadine 180 MG tablet Commonly known as:  ALLEGRA Take 180 mg by mouth every morning.   glucose blood test strip Commonly known as:  ONETOUCH VERIO Check blood sugar no more than twice daily   lamoTRIgine 25 MG tablet Commonly known as:  LAMICTAL Take 50 mg by mouth at bedtime.   losartan 25 MG tablet Commonly known as:  COZAAR Take 1 tablet (25 mg total) by mouth daily.   multivitamin tablet Take 1 tablet by mouth daily. Senior complex   Conservation officer, nature Check blood sugars no more than twice daily   pioglitazone-metformin 15-850 MG tablet Commonly known as:  ACTOPLUS MET Take 1 tablet by mouth 2 (two) times daily with a meal.   SAPHRIS 5 MG Subl 24 hr tablet Generic drug:  asenapine Place 5 mg under the tongue at bedtime.   simvastatin 40 MG tablet Commonly known as:  ZOCOR Take 1 tablet (40 mg total) at bedtime by mouth.   traZODone 50 MG tablet Commonly known as:  DESYREL Take 150 mg by mouth at bedtime as needed for sleep.     VERAMYST 27.5 MCG/SPRAY nasal spray Generic drug:  fluticasone Place 2 sprays into the nose daily as needed.          Objective:   Physical Exam BP 128/78 (BP Location: Left Arm, Patient Position: Sitting, Cuff Size: Normal)   Pulse 70   Temp 98.2 F (36.8 C) (Oral)   Resp 16  Ht 5' 10"  (1.778 m)   Wt 228 lb (103.4 kg)   SpO2 97%   BMI 32.71 kg/m  General:   Well developed, well nourished . NAD.  HEENT:  Normocephalic . Face symmetric, atraumatic Lungs:  CTA B Normal respiratory effort, no intercostal retractions, no accessory muscle use. Heart: RRR,  no murmur.  No pretibial edema bilaterally  Skin: Not pale. Not jaundice Neurologic:  alert & oriented X3.  Speech normal, gait appropriate for age and unassisted Psych--  Cognition and judgment appear intact.  Cooperative with normal attention span and concentration.  Behavior appropriate. No anxious or depressed appearing.      Assessment & Plan:    Assessment DM HTN Hyperlipidemia Morbid obesity (BMI 36 plus DM) Bipolar, Depression ------ see psychiatry, Dr Casimiro Needle DJD-- hydrocodone rarely rx by [place] ortho Asthma-Allergies ------------ Dr [name]  GERD, h/o dysphagia (chronic, neurogenic-transfer dysphagia? See GI note 12-2011) Neuro: --? stroke : saw neuro 2013 d/t B transient visual loss, ASA changed to plavix.  --See CTA, MRIs of head-neck report  --Saw neuro 11-2013: fall, syncope,parkinsonism d/t sapharis? CV: Enlarged ascending Ao per CT 12-2015,CT chest 08-2016 stable recheck q year  Nl LE arterial dopplers 2015 Carotid artery disease: Per ultrasound 2011, last [place] 2018: <50% B, rx medical treatment, recheck 2 years  -Ultrasound 2011 CT chest  RML  89m: 07-2014, CT 12-2015, CT 08-2016: Stable, no further eval. Enlarged Ascending Ao 3.9 cm: f/u yearly.  Last CT 08-2016.  Stable  MRI chest w/o 10/2017: stable aorta, no further routine x-rays recommended.  RML  nodule is stable as well HOH H/o  Mnire's disease   PLAN:  DM: Continue actos-plusmet , encourage a healthy diet, check a BMP and A1c. Chest MRI results reviewed, assessment updated.  There was a question of ATX versus pneumonia, he had no symptoms of pneumonia at the time of the MRI and continue with no resp sxs. HTN: Losartan, controlled , checking labs Anemia per chart review: ROS negative, had a normal colonoscopy in 2011.  Previously, iron was normal, ferritin slightly low.  Recommend a CBC, iron, ferritin, BT24 folic acid, reticulocyte count, blood smear. RTC 6 months

## 2018-02-10 LAB — RETICULOCYTES
ABS Retic: 64500 cells/uL (ref 25000–9000)
Retic Ct Pct: 1.5 %

## 2018-02-10 LAB — PATHOLOGIST SMEAR REVIEW

## 2018-02-10 NOTE — Assessment & Plan Note (Signed)
DM: Continue actos-plusmet , encourage a healthy diet, check a BMP and A1c. Chest MRI results reviewed, assessment updated.  There was a question of ATX versus pneumonia, he had no symptoms of pneumonia at the time of the MRI and continue with no resp sxs. HTN: Losartan, controlled , checking labs Anemia per chart review: ROS negative, had a normal colonoscopy in 2011.  Previously, iron was normal, ferritin slightly low.  Recommend a CBC, iron, ferritin, B12, folic acid, reticulocyte count, blood smear. RTC 6 months

## 2018-02-15 DIAGNOSIS — J3089 Other allergic rhinitis: Secondary | ICD-10-CM | POA: Diagnosis not present

## 2018-02-15 DIAGNOSIS — J301 Allergic rhinitis due to pollen: Secondary | ICD-10-CM | POA: Diagnosis not present

## 2018-02-15 MED ORDER — FERROUS SULFATE 325 (65 FE) MG PO TABS: 325.0000 mg | ORAL_TABLET | Freq: Two times a day (BID) | ORAL | 6 refills | Status: DC

## 2018-02-15 NOTE — Addendum Note (Signed)
Addended byConrad [place]: Eithan Beagle D on: 02/15/2018 05:33 PM   Modules accepted: Orders

## 2018-02-16 ENCOUNTER — Encounter: Payer: Self-pay | Admitting: Gastroenterology

## 2018-02-22 DIAGNOSIS — J301 Allergic rhinitis due to pollen: Secondary | ICD-10-CM | POA: Diagnosis not present

## 2018-02-22 DIAGNOSIS — J3089 Other allergic rhinitis: Secondary | ICD-10-CM | POA: Diagnosis not present

## 2018-03-01 DIAGNOSIS — J301 Allergic rhinitis due to pollen: Secondary | ICD-10-CM | POA: Diagnosis not present

## 2018-03-01 DIAGNOSIS — J3089 Other allergic rhinitis: Secondary | ICD-10-CM | POA: Diagnosis not present

## 2018-03-10 DIAGNOSIS — J301 Allergic rhinitis due to pollen: Secondary | ICD-10-CM | POA: Diagnosis not present

## 2018-03-10 DIAGNOSIS — J3081 Allergic rhinitis due to animal (cat) (dog) hair and dander: Secondary | ICD-10-CM | POA: Diagnosis not present

## 2018-03-10 DIAGNOSIS — J3089 Other allergic rhinitis: Secondary | ICD-10-CM | POA: Diagnosis not present

## 2018-03-13 ENCOUNTER — Encounter: Payer: Self-pay | Admitting: Internal Medicine

## 2018-03-13 ENCOUNTER — Other Ambulatory Visit: Payer: Self-pay | Admitting: Internal Medicine

## 2018-03-14 DIAGNOSIS — J301 Allergic rhinitis due to pollen: Secondary | ICD-10-CM | POA: Diagnosis not present

## 2018-03-14 DIAGNOSIS — J300 Vasomotor rhinitis: Secondary | ICD-10-CM | POA: Diagnosis not present

## 2018-03-14 DIAGNOSIS — J3089 Other allergic rhinitis: Secondary | ICD-10-CM | POA: Diagnosis not present

## 2018-03-14 DIAGNOSIS — J4540 Moderate persistent asthma, uncomplicated: Secondary | ICD-10-CM | POA: Diagnosis not present

## 2018-03-17 ENCOUNTER — Encounter: Payer: Self-pay | Admitting: Internal Medicine

## 2018-03-17 ENCOUNTER — Ambulatory Visit (INDEPENDENT_AMBULATORY_CARE_PROVIDER_SITE_OTHER): Payer: Medicare Other | Admitting: Internal Medicine

## 2018-03-17 VITALS — BP 132/80 | HR 70 | Temp 98.0°F | Resp 16 | Ht 70.0 in | Wt 230.1 lb

## 2018-03-17 DIAGNOSIS — J45901 Unspecified asthma with (acute) exacerbation: Secondary | ICD-10-CM

## 2018-03-17 MED ORDER — PREDNISONE 10 MG PO TABS: ORAL_TABLET | ORAL | 0 refills | Status: DC

## 2018-03-17 MED ORDER — DOXYCYCLINE HYCLATE 100 MG PO CAPS: 100.0000 mg | ORAL_CAPSULE | Freq: Two times a day (BID) | ORAL | 0 refills | Status: DC

## 2018-03-17 NOTE — Progress Notes (Signed)
Subjective:    Patient ID: Ryan Zhang, male    DOB: 1946/08/15, 72 y.o.   MRN: [id number]  DOS:  03/17/2018 Type of visit - description : acute Interval history: Sx started 3 days ago after he visit his allergist. Increased wheezing, increased cough, no sputum production. Some sore throat. Has also developed watery diarrhea, 2-3 episodes since the onset of symptoms. Denies abdominal pain no blood in the stools.   Review of Systems  Denies fever, chills. Admits to malaise.  Past Medical History:  Diagnosis Date  . Allergy    allergy shots Dr. Velora Heckler  . Asthma    moderate persistant  . Bipolar affective ([place])   . Diabetes mellitus   . DJD (degenerative joint disease)   . Dysphagia   . Gallstones    hx of, s/p cholecystectomy  . GERD (gastroesophageal reflux disease)   . Hepatitis A    as teenager 80's  . Hollenhorst plaque    right eye  . Hyperlipidemia   . Hypertension   . Kidney stones    hx of  . Meniere's disease   . Motility disorder, esophageal   . Obesity   . Pancreatitis   . Personal history of colonic polyps 11/2004   hyperplastic.  . Stroke [facility])    per MRI    Past Surgical History:  Procedure Laterality Date  . CATARACT EXTRACTION Bilateral 09/2015 and 10/2015  . CHOLECYSTECTOMY    . TOTAL KNEE ARTHROPLASTY Bilateral    both knees    Social History   Socioeconomic History  . Marital status: Married    Spouse name: Not on file  . Number of children: 1  . Years of education: Not on file  . Highest education level: Not on file  Occupational History  . Occupation: disabled    Employer: DISABLED    Comment: laboratory computing/programming  Social Needs  . Financial resource strain: Not on file  . Food insecurity:    Worry: Not on file    Inability: Not on file  . Transportation needs:    Medical: Not on file    Non-medical: Not on file  Tobacco Use  . Smoking status: Former Smoker    Last attempt to quit: 11/15/1964    Years since  quitting: 53.3  . Smokeless tobacco: Never Used  Substance and Sexual Activity  . Alcohol use: No  . Drug use: No  . Sexual activity: Never  Lifestyle  . Physical activity:    Days per week: Not on file    Minutes per session: Not on file  . Stress: Not on file  Relationships  . Social connections:    Talks on phone: Not on file    Gets together: Not on file    Attends religious service: Not on file    Active member of club or organization: Not on file    Attends meetings of clubs or organizations: Not on file    Relationship status: Not on file  . Intimate partner violence:    Fear of current or ex partner: Not on file    Emotionally abused: Not on file    Physically abused: Not on file    Forced sexual activity: Not on file  Other Topics Concern  . Not on file  Social History Narrative   Household-- pt , wife, adopted  daughter (h/o substance abuse,on a methadone program, doing better )      Allergies as of 03/17/2018  Reactions   Smz-tmp Ds [sulfamethoxazole-trimethoprim] Nausea And Vomiting   Divalproex Sodium Other (See Comments)   Pancreatitis    Methocarbamol Other (See Comments)   hallucinations   Other    "Symbrax" alteration in blood sugar, pt unsure if low or high blood sugar   Paroxetine Other (See Comments)   Insomnia   Percocet [oxycodone-acetaminophen] Other (See Comments)   Hyperactivity   Risperidone Other (See Comments)   REACTION: hyper   Wellbutrin [bupropion] Other (See Comments)   Hot flashes   Citalopram Hydrobromide Other (See Comments)   Insomnia      Medication List        Accurate as of 03/17/18 11:59 PM. Always use your most recent med list.          albuterol 108 (90 Base) MCG/ACT inhaler Commonly known as:  PROVENTIL HFA;VENTOLIN HFA Inhale 2 puffs into the lungs every 6 (six) hours as needed for wheezing or shortness of breath.   albuterol (2.5 MG/3ML) 0.083% nebulizer solution Commonly known as:  PROVENTIL Take 3 mLs  (2.5 mg total) by nebulization every 6 (six) hours as needed for wheezing or shortness of breath.   augmented betamethasone dipropionate 0.05 % ointment Commonly known as:  DIPROLENE-AF Apply topically 2 (two) times daily as needed.   budesonide-formoterol 160-4.5 MCG/ACT inhaler Commonly known as:  SYMBICORT Inhale 2 puffs into the lungs 2 (two) times daily.   clonazePAM 0.5 MG tablet Commonly known as:  KLONOPIN Take 0.5 mg by mouth 3 (three) times daily. 1 mg in the morning and 0.5 mg in the evening.   clopidogrel 75 MG tablet Commonly known as:  PLAVIX Take 1 tablet (75 mg total) daily by mouth.   clotrimazole-betamethasone cream Commonly known as:  LOTRISONE Apply 1 application topically 2 (two) times daily.   CYMBALTA 60 MG capsule Generic drug:  DULoxetine Take 60 mg by mouth every morning.   doxycycline 100 MG capsule Commonly known as:  VIBRAMYCIN Take 1 capsule (100 mg total) by mouth 2 (two) times daily.   EPIPEN 2-PAK 0.3 mg/0.3 mL Soaj injection Generic drug:  EPINEPHrine Reported on 04/29/2016   esomeprazole 40 MG capsule Commonly known as:  NEXIUM Take 40 mg by mouth daily.   ferrous sulfate 325 (65 FE) MG tablet Take 1 tablet (325 mg total) by mouth 2 (two) times daily with a meal.   fexofenadine 180 MG tablet Commonly known as:  ALLEGRA Take 180 mg by mouth every morning.   glucose blood test strip Commonly known as:  ONETOUCH VERIO Check blood sugar no more than twice daily   ipratropium 0.06 % nasal spray Commonly known as:  ATROVENT Place 1-2 sprays into both nostrils 4 (four) times daily as needed for rhinitis.   lamoTRIgine 25 MG tablet Commonly known as:  LAMICTAL Take 50 mg by mouth at bedtime.   losartan 25 MG tablet Commonly known as:  COZAAR Take 1 tablet (25 mg total) by mouth daily.   multivitamin tablet Take 1 tablet by mouth daily. Senior complex   Conservation officer, nature Check blood sugars no more than twice daily     pioglitazone-metformin 15-850 MG tablet Commonly known as:  ACTOPLUS MET Take 1 tablet by mouth 2 (two) times daily with a meal.   predniSONE 10 MG tablet Commonly known as:  DELTASONE 3 tabs x 2 days, 2 tabs x 2 days, 1 tab x 2 days   SAPHRIS 5 MG Subl 24 hr tablet Generic drug:  asenapine Place 5  mg under the tongue at bedtime.   simvastatin 40 MG tablet Commonly known as:  ZOCOR Take 1 tablet (40 mg total) at bedtime by mouth.   traZODone 50 MG tablet Commonly known as:  DESYREL Take 150 mg by mouth at bedtime as needed for sleep.   VERAMYST 27.5 MCG/SPRAY nasal spray Generic drug:  fluticasone Place 2 sprays into the nose daily as needed.          Objective:   Physical Exam BP 132/80 (BP Location: Left Arm, Patient Position: Sitting, Cuff Size: Normal)   Pulse 70   Temp 98 F (36.7 C) (Oral)   Resp 16   Ht 5' 10"  (1.778 m)   Wt 230 lb 2 oz (104.4 kg)   SpO2 96%   BMI 33.02 kg/m   General:   Well developed, well nourished . NAD.  HEENT:  Normocephalic . Face symmetric, atraumatic. Nose is slightly congested, throat symmetric, no red. Lungs:  Few rhonchi and increased expiratory time.  No crackles.  No increased work of breathing. Heart: RRR,  no murmur.  No pretibial edema bilaterally  Skin: Not pale. Not jaundice Neurologic:  alert & oriented X3.  Speech normal, gait appropriate for age and unassisted Psych--  Cognition and judgment appear intact.  Cooperative with normal attention span and concentration.  Behavior appropriate. No anxious or depressed appearing.      Assessment & Plan:   Assessment DM HTN Hyperlipidemia Morbid obesity (BMI 36 plus DM) Bipolar, Depression ------ see psychiatry, Dr Casimiro Needle DJD-- hydrocodone rarely rx by [place] ortho Asthma-Allergies ------------ Dr [name]  GERD, h/o dysphagia (chronic, neurogenic-transfer dysphagia? See GI note 12-2011) Neuro: --? stroke : saw neuro 2013 d/t B transient visual loss, ASA  changed to plavix.  --See CTA, MRIs of head-neck report  --Saw neuro 11-2013: fall, syncope,parkinsonism d/t sapharis? CV: Enlarged ascending Ao per CT 12-2015,CT chest 08-2016 stable recheck q year  Nl LE arterial dopplers 2015 Carotid artery disease: Per ultrasound 2011, last [place] 2018: <50% B, rx medical treatment, recheck 2 years  CT chest  RML  53m: 07-2014, CT 12-2015, CT 08-2016: Stable, no further eval. Enlarged Ascending Ao 3.9 cm: f/u yearly.  Last CT 08-2016.  Stable  MRI chest w/o 10/2017: stable aorta, no further routine x-rays recommended.  RML  nodule is stable as well HOH H/o Mnire's disease   PLAN:  Asthma exacerbation: Suspect asthma exacerbation due to probably a viral syndrome (having respiratory symptoms and diarrhea). For asthma recommend a more liberal use of nebulizations, doxycycline which helped before, prednisone, Mucinex, good hydration encouraged.  If not better he will let me know. DM.  See instructions.

## 2018-03-17 NOTE — Progress Notes (Signed)
Pre visit review using our clinic review tool, if applicable. No additional management support is needed unless otherwise documented below in the visit note. 

## 2018-03-17 NOTE — Patient Instructions (Signed)
Rest, fluids , tylenol  For cough:  Take Mucinex DM twice a day as needed until better  Use the nebulizer more frequently   Take prednisone as prescribed, stop it if the blood sugar is more than 200  Take the antibiotic as prescribed  (doxycyclne )  Call if not gradually better over the next  10 days  Call anytime if the symptoms are severe

## 2018-03-18 NOTE — Assessment & Plan Note (Signed)
Asthma exacerbation: Suspect asthma exacerbation due to probably a viral syndrome (having respiratory symptoms and diarrhea). For asthma recommend a more liberal use of nebulizations, doxycycline which helped before, prednisone, Mucinex, good hydration encouraged.  If not better he will let me know. DM.  See instructions.

## 2018-03-19 ENCOUNTER — Encounter: Payer: Self-pay | Admitting: Internal Medicine

## 2018-03-21 ENCOUNTER — Other Ambulatory Visit: Payer: Self-pay | Admitting: Internal Medicine

## 2018-03-26 ENCOUNTER — Other Ambulatory Visit: Payer: Self-pay | Admitting: Internal Medicine

## 2018-03-29 DIAGNOSIS — J301 Allergic rhinitis due to pollen: Secondary | ICD-10-CM | POA: Diagnosis not present

## 2018-03-29 DIAGNOSIS — J3089 Other allergic rhinitis: Secondary | ICD-10-CM | POA: Diagnosis not present

## 2018-03-31 ENCOUNTER — Ambulatory Visit: Payer: Medicare Other | Admitting: Gastroenterology

## 2018-03-31 ENCOUNTER — Other Ambulatory Visit (INDEPENDENT_AMBULATORY_CARE_PROVIDER_SITE_OTHER): Payer: Medicare Other

## 2018-03-31 ENCOUNTER — Encounter: Payer: Self-pay | Admitting: Gastroenterology

## 2018-03-31 VITALS — BP 108/70 | HR 92 | Ht 68.0 in | Wt 232.2 lb

## 2018-03-31 DIAGNOSIS — D509 Iron deficiency anemia, unspecified: Secondary | ICD-10-CM

## 2018-03-31 LAB — IBC PANEL
Iron: 76 ug/dL (ref 42–165)
Saturation Ratios: 17 % — ABNORMAL LOW (ref 20.0–50.0)
Transferrin: 319 mg/dL (ref 212.0–360.0)

## 2018-03-31 LAB — CBC WITH DIFFERENTIAL/PLATELET
Basophils Absolute: 0 10*3/uL (ref 0.0–0.1)
Basophils Relative: 0.5 % (ref 0.0–3.0)
Eosinophils Absolute: 0.2 10*3/uL (ref 0.0–0.7)
Eosinophils Relative: 2.3 % (ref 0.0–5.0)
HCT: 37.7 % — ABNORMAL LOW (ref 39.0–52.0)
Hemoglobin: 12 g/dL — ABNORMAL LOW (ref 13.0–17.0)
Lymphocytes Relative: 29.9 % (ref 12.0–46.0)
Lymphs Abs: 2.9 10*3/uL (ref 0.7–4.0)
MCHC: 31.8 g/dL (ref 30.0–36.0)
MCV: 83.8 fl (ref 78.0–100.0)
Monocytes Absolute: 0.7 10*3/uL (ref 0.1–1.0)
Monocytes Relative: 6.9 % (ref 3.0–12.0)
Neutro Abs: 5.8 10*3/uL (ref 1.4–7.7)
Neutrophils Relative %: 60.4 % (ref 43.0–77.0)
Platelets: 315 10*3/uL (ref 150.0–400.0)
RBC: 4.5 Mil/uL (ref 4.22–5.81)
RDW: 19.5 % — ABNORMAL HIGH (ref 11.5–15.5)
WBC: 9.6 10*3/uL (ref 4.0–10.5)

## 2018-03-31 LAB — FERRITIN: Ferritin: 29 ng/mL (ref 22.0–322.0)

## 2018-03-31 NOTE — Patient Instructions (Addendum)
If you are age 70 or older, your body mass index should be between 23-30. Your Body mass index is 35.31 kg/m. If this is out of the aforementioned range listed, please consider follow up with your Primary Care Provider.  If you are age 29 or younger, your body mass index should be between 19-25. Your Body mass index is 35.31 kg/m. If this is out of the aformentioned range listed, please consider follow up with your Primary Care Provider.   Your provider has requested that you go to the basement level for lab work before leaving today. Press "B" on the elevator. The lab is located at the first door on the left as you exit the elevator.  STOP IRON 1 WEEK PRIOR TO COMPLETING STOOL CARDS!!!!  Thank you for choosing [facility]  Dr Amada Jupiter III

## 2018-03-31 NOTE — Progress Notes (Signed)
[facility] Consult Note:  History: Ryan Zhang 03/31/2018  Referring physician: Colon Branch, MD  Reason for consult/chief complaint: Anemia and Fatigue (but better since starting iron)   Subjective  HPI:  This is a very pleasant 72 year old man referred by primary care and accompanied by his wife.  He was last seen by Dr. Sharlett Iles at the time of a routine colonoscopy in November 2011.  That procedure was normal, and 2 diminutive polyps were found on the colonoscopy in 2006. Pax was referred for iron deficiency anemia.  At his most recent primary care visit he was noted to have a history of anemia, which seems to refer to hemoglobin of 12.4 in November 2018 or hemoglobin of 11.6 in September 2018.  No iron studies were done at that time, so they were done during the recent primary care visit in March, no repeat CBC at that time.  Ferritin level was mildly decreased and he was referred to [place]. Jenna denies any ongoing digestive symptoms such as dysphagia, odynophagia, nausea, vomiting, early satiety, weight loss, change in bowel habits or passage of black or bloody stool.  He is on oral iron since his recent lab tests and believes it has given him more energy. He has had no hemoptysis, hematemesis or hematuria.  ROS:  Review of Systems  Constitutional: Positive for fatigue. Negative for appetite change and unexpected weight change.  HENT: Negative for mouth sores and voice change.   Eyes: Negative for pain and redness.  Respiratory: Positive for shortness of breath. Negative for cough.   Cardiovascular: Negative for chest pain and palpitations.  Genitourinary: Positive for frequency. Negative for dysuria and hematuria.  Musculoskeletal: Negative for arthralgias and myalgias.  Skin: Negative for pallor and rash.  Allergic/Immunologic: Positive for environmental allergies.  Neurological: Negative for weakness and headaches.  Hematological: Negative for  adenopathy.  Psychiatric/Behavioral:       Anxiety and depression     Past Medical History: Past Medical History:  Diagnosis Date  . Allergy    allergy shots Dr. Velora Heckler  . Anemia   . Anxiety   . Asthma    moderate persistant  . Bipolar affective ([place])   . Depression   . Diabetes mellitus   . DJD (degenerative joint disease)   . Dysphagia   . Gallstones    hx of, s/p cholecystectomy  . GERD (gastroesophageal reflux disease)   . Hepatitis A    as teenager 55's  . Hollenhorst plaque    right eye  . Hyperlipidemia   . Hypertension   . Kidney stones    hx of  . Meniere's disease   . Motility disorder, esophageal   . Obesity   . Pancreatitis   . Personal history of colonic polyps 11/2004   hyperplastic.  . Stroke [facility])    per MRI     Past Surgical History: Past Surgical History:  Procedure Laterality Date  . CATARACT EXTRACTION Bilateral 09/2015 and 10/2015  . CHOLECYSTECTOMY    . TOTAL KNEE ARTHROPLASTY Bilateral    both knees     Family History: Family History  Problem Relation Age of Onset  . Diabetes Father   . Heart disease Brother   . Heart disease Maternal Grandmother   . Cancer Sister        unknown type  . Colon cancer Neg Hx   . Prostate cancer Neg Hx     Social History: Social History   Socioeconomic History  . Marital  status: Married    Spouse name: Not on file  . Number of children: 1  . Years of education: Not on file  . Highest education level: Not on file  Occupational History  . Occupation: disabled    Employer: DISABLED    Comment: laboratory computing/programming  Social Needs  . Financial resource strain: Not on file  . Food insecurity:    Worry: Not on file    Inability: Not on file  . Transportation needs:    Medical: Not on file    Non-medical: Not on file  Tobacco Use  . Smoking status: Former Smoker    Last attempt to quit: 11/15/1964    Years since quitting: 53.4  . Smokeless tobacco: Never Used  Substance and  Sexual Activity  . Alcohol use: Not Currently  . Drug use: No  . Sexual activity: Never  Lifestyle  . Physical activity:    Days per week: Not on file    Minutes per session: Not on file  . Stress: Not on file  Relationships  . Social connections:    Talks on phone: Not on file    Gets together: Not on file    Attends religious service: Not on file    Active member of club or organization: Not on file    Attends meetings of clubs or organizations: Not on file    Relationship status: Not on file  Other Topics Concern  . Not on file  Social History Narrative   Household-- pt , wife, adopted  daughter (h/o substance abuse,on a methadone program, doing better )    Allergies: Allergies  Allergen Reactions  . Smz-Tmp Ds [Sulfamethoxazole-Trimethoprim] Nausea And Vomiting  . Divalproex Sodium Other (See Comments)    Pancreatitis   . Methocarbamol Other (See Comments)    hallucinations  . Other     "Symbrax" alteration in blood sugar, pt unsure if low or high blood sugar  . Paroxetine Other (See Comments)    Insomnia  . Percocet [Oxycodone-Acetaminophen] Other (See Comments)    Hyperactivity  . Risperidone Other (See Comments)    REACTION: hyper  . Wellbutrin [Bupropion] Other (See Comments)    Hot flashes  . Citalopram Hydrobromide Other (See Comments)    Insomnia    Outpatient Meds: Current Outpatient Medications  Medication Sig Dispense Refill  . albuterol (PROVENTIL HFA;VENTOLIN HFA) 108 (90 Base) MCG/ACT inhaler Inhale 2 puffs into the lungs every 6 (six) hours as needed for wheezing or shortness of breath. 18 g 5  . albuterol (PROVENTIL) (2.5 MG/3ML) 0.083% nebulizer solution Take 3 mLs (2.5 mg total) by nebulization every 6 (six) hours as needed for wheezing or shortness of breath. 180 mL 1  . asenapine (SAPHRIS) 5 MG SUBL Place 5 mg under the tongue at bedtime.     [name] augmented betamethasone dipropionate (DIPROLENE-AF) 0.05 % ointment Apply topically 2 (two) times  daily as needed. 45 g 2  . budesonide-formoterol (SYMBICORT) 160-4.5 MCG/ACT inhaler Inhale 2 puffs into the lungs 2 (two) times daily.     . clonazePAM (KLONOPIN) 0.5 MG tablet Take 0.5 mg by mouth 3 (three) times daily. 1 mg in the morning and 0.5 mg in the evening.    . clopidogrel (PLAVIX) 75 MG tablet Take 1 tablet (75 mg total) by mouth daily. 30 tablet 5  . clotrimazole-betamethasone (LOTRISONE) cream Apply 1 application topically 2 (two) times daily. 30 g 2  . DULoxetine (CYMBALTA) 60 MG capsule Take 60 mg by mouth every  morning.     [name] esomeprazole (NEXIUM) 40 MG capsule Take 40 mg by mouth daily.     . ferrous sulfate 325 (65 FE) MG tablet Take 1 tablet (325 mg total) by mouth 2 (two) times daily with a meal. 60 tablet 6  . fexofenadine (ALLEGRA) 180 MG tablet Take 180 mg by mouth every morning.     . fluticasone (VERAMYST) 27.5 MCG/SPRAY nasal spray Place 2 sprays into the nose daily as needed.     [name] glucose blood (ONETOUCH VERIO) test strip Check blood sugar no more than twice daily 100 each 11  . ipratropium (ATROVENT) 0.06 % nasal spray Place 1-2 sprays into both nostrils 4 (four) times daily as needed for rhinitis.     [name] lamoTRIgine (LAMICTAL) 25 MG tablet Take 50 mg by mouth at bedtime.    . Lancets (ONETOUCH ULTRASOFT) lancets Check blood sugars no more than twice daily 100 each 12  . losartan (COZAAR) 25 MG tablet Take 1 tablet (25 mg total) by mouth daily. 30 tablet 5  . Multiple Vitamin (MULTIVITAMIN) tablet Take 1 tablet by mouth daily. Senior complex    . pioglitazone-metformin (ACTOPLUS MET) 15-850 MG tablet Take 1 tablet by mouth 2 (two) times daily with a meal. 60 tablet 5  . simvastatin (ZOCOR) 40 MG tablet Take 1 tablet (40 mg total) at bedtime by mouth. 90 tablet 1  . traZODone (DESYREL) 50 MG tablet Take 150 mg by mouth at bedtime as needed for sleep.     [name] EPIPEN 2-PAK 0.3 MG/0.3ML SOAJ injection Reported on 04/29/2016     No current facility-administered medications  for this visit.       ___________________________________________________________________ Objective   Exam:  BP 108/70 (BP Location: Left Arm, Patient Position: Sitting, Cuff Size: Normal)   Pulse 92   Ht 5' 8"  (1.727 m) Comment: height measured without shoes  Wt 232 lb 4 oz (105.3 kg)   BMI 35.31 kg/m    General: this is a(n) well-appearing man  Eyes: sclera anicteric, no redness  ENT: oral mucosa moist without lesions, no cervical or supraclavicular lymphadenopathy, good dentition  CV: RRR without murmur, S1/S2, no JVD, no peripheral edema  Resp: clear to auscultation bilaterally, normal RR and effort noted  GI: soft, no tenderness, with active bowel sounds. No guarding or palpable organomegaly noted.  Skin; warm and dry, no rash or jaundice noted  Neuro: awake, alert and oriented x 3. Normal gross motor function and fluent speech He has a bruise on the right arm and right knee after a fall last week.   Scars from bilateral knee replacements Labs:  CBC Latest Ref Rng & Units 03/31/2018 10/10/2017 07/23/2017  WBC 4.0 - 10.5 K/uL 9.6 9.3 11.7(H)  Hemoglobin 13.0 - 17.0 g/dL 12.0(L) 12.4(L) 11.6(L)  Hematocrit 39.0 - 52.0 % 37.7(L) 39.1 35.9(L)  Platelets 150.0 - 400.0 K/uL 315.0 297.0 241  nml MCV each time   02/09/18  Iron 49 Ferritin 18 Folate and B12 normal  Assessment: Encounter Diagnosis  Name Primary?  . Iron deficiency anemia, unspecified iron deficiency anemia type Yes    Mildly decreased iron level with a near normal hemoglobin, normal MCV and no localizing GI symptoms. It is not clear that this iron deficiency is on the basis of chronic GI blood loss.  Plan:  I sent in for CBC and iron studies today.  I asked him to stop the oral iron and after a week, do 3 stool cards on successive days,  mailed to [place].  If his iron deficiency has resolved and he is heme-negative, he would not appear to need an endoscopic work-up.  We will contact him with results and  proceed accordingly.  Thank you for the courtesy of this consult.  Please call me with any questions or concerns.  Nelida Meuse III  CC: Colon Branch, MD

## 2018-04-03 DIAGNOSIS — J3089 Other allergic rhinitis: Secondary | ICD-10-CM | POA: Diagnosis not present

## 2018-04-03 DIAGNOSIS — J301 Allergic rhinitis due to pollen: Secondary | ICD-10-CM | POA: Diagnosis not present

## 2018-04-06 DIAGNOSIS — J3089 Other allergic rhinitis: Secondary | ICD-10-CM | POA: Diagnosis not present

## 2018-04-06 DIAGNOSIS — J301 Allergic rhinitis due to pollen: Secondary | ICD-10-CM | POA: Diagnosis not present

## 2018-04-12 DIAGNOSIS — J3089 Other allergic rhinitis: Secondary | ICD-10-CM | POA: Diagnosis not present

## 2018-04-12 DIAGNOSIS — J301 Allergic rhinitis due to pollen: Secondary | ICD-10-CM | POA: Diagnosis not present

## 2018-04-12 LAB — HM DIABETES EYE EXAM

## 2018-04-13 LAB — FECAL OCCULT BLOOD, GUAIAC: Fecal Occult Blood: POSITIVE

## 2018-04-14 DIAGNOSIS — J301 Allergic rhinitis due to pollen: Secondary | ICD-10-CM | POA: Diagnosis not present

## 2018-04-14 DIAGNOSIS — J3089 Other allergic rhinitis: Secondary | ICD-10-CM | POA: Diagnosis not present

## 2018-04-17 ENCOUNTER — Other Ambulatory Visit: Payer: Self-pay | Admitting: Internal Medicine

## 2018-04-19 DIAGNOSIS — J3089 Other allergic rhinitis: Secondary | ICD-10-CM | POA: Diagnosis not present

## 2018-04-19 DIAGNOSIS — J301 Allergic rhinitis due to pollen: Secondary | ICD-10-CM | POA: Diagnosis not present

## 2018-04-24 ENCOUNTER — Other Ambulatory Visit (INDEPENDENT_AMBULATORY_CARE_PROVIDER_SITE_OTHER): Payer: Medicare Other

## 2018-04-24 DIAGNOSIS — D509 Iron deficiency anemia, unspecified: Secondary | ICD-10-CM | POA: Diagnosis not present

## 2018-04-24 LAB — HEMOCCULT SLIDES (X 3 CARDS)
Fecal Occult Blood: NEGATIVE
OCCULT 1: NEGATIVE
OCCULT 2: NEGATIVE
OCCULT 3: NEGATIVE
OCCULT 4: NEGATIVE
OCCULT 5: NEGATIVE

## 2018-04-26 DIAGNOSIS — J3089 Other allergic rhinitis: Secondary | ICD-10-CM | POA: Diagnosis not present

## 2018-04-26 DIAGNOSIS — J301 Allergic rhinitis due to pollen: Secondary | ICD-10-CM | POA: Diagnosis not present

## 2018-04-28 ENCOUNTER — Encounter: Payer: Self-pay | Admitting: Internal Medicine

## 2018-05-01 ENCOUNTER — Encounter: Payer: Self-pay | Admitting: Internal Medicine

## 2018-05-03 ENCOUNTER — Telehealth: Payer: Self-pay | Admitting: Gastroenterology

## 2018-05-03 ENCOUNTER — Encounter (INDEPENDENT_AMBULATORY_CARE_PROVIDER_SITE_OTHER): Payer: Self-pay

## 2018-05-03 DIAGNOSIS — J3089 Other allergic rhinitis: Secondary | ICD-10-CM | POA: Diagnosis not present

## 2018-05-03 DIAGNOSIS — J301 Allergic rhinitis due to pollen: Secondary | ICD-10-CM | POA: Diagnosis not present

## 2018-05-03 NOTE — Telephone Encounter (Signed)
Patient on Plavix due to question of a stroke back in 2013, has been stable.  Okay to hold Plavix per GI protocol. Thank you for your help

## 2018-05-03 NOTE — Telephone Encounter (Signed)
Please see note from PCP Dr. Drue NovelPaz that it is OK to hold plavix 5 days prior to procedures.  - HD

## 2018-05-03 NOTE — Telephone Encounter (Signed)
Please see recent email from patient to Dr. Drue NovelPaz and then from me to patient regarding a heme positive stool test done by home health.  He needs to be scheduled for EGD and colon[facility] for Heme positive stool and iron deficiency anemia. Must be off plavix 5 days prior, and we needs clearance from Dr. Drue NovelPaz for that 9I believe Mr. Ryan Zhang takes it for history of CVA).  Please contact patient and make arrangements as well as communication with PCP Dr. Drue NovelPaz regarding plavix.

## 2018-05-03 NOTE — Telephone Encounter (Signed)
Neysa BonitoChristy, can you please contact this patient to schedule [facility]. He will also need a pre-visit appointment. Please put in PV appt note to see today's phone note re: Plavix. Thank you. Raynelle FanningJulie

## 2018-05-08 ENCOUNTER — Encounter: Payer: Self-pay | Admitting: Internal Medicine

## 2018-05-08 MED ORDER — PIOGLITAZONE HCL-METFORMIN HCL 15-850 MG PO TABS: 1.0000 | ORAL_TABLET | Freq: Two times a day (BID) | ORAL | 1 refills | Status: DC

## 2018-05-08 MED ORDER — LOSARTAN POTASSIUM 25 MG PO TABS: 25.0000 mg | ORAL_TABLET | Freq: Every day | ORAL | 1 refills | Status: DC

## 2018-05-08 NOTE — Telephone Encounter (Signed)
Called pt to schedule and he is diabetic and needing an AM appt.  Will call back once the September calender comes available

## 2018-05-09 ENCOUNTER — Encounter: Payer: Self-pay | Admitting: Internal Medicine

## 2018-05-10 DIAGNOSIS — J3089 Other allergic rhinitis: Secondary | ICD-10-CM | POA: Diagnosis not present

## 2018-05-10 DIAGNOSIS — J301 Allergic rhinitis due to pollen: Secondary | ICD-10-CM | POA: Diagnosis not present

## 2018-05-16 ENCOUNTER — Encounter: Payer: Self-pay | Admitting: Gastroenterology

## 2018-05-17 DIAGNOSIS — J3089 Other allergic rhinitis: Secondary | ICD-10-CM | POA: Diagnosis not present

## 2018-05-17 DIAGNOSIS — J301 Allergic rhinitis due to pollen: Secondary | ICD-10-CM | POA: Diagnosis not present

## 2018-05-24 DIAGNOSIS — J3089 Other allergic rhinitis: Secondary | ICD-10-CM | POA: Diagnosis not present

## 2018-05-24 DIAGNOSIS — J301 Allergic rhinitis due to pollen: Secondary | ICD-10-CM | POA: Diagnosis not present

## 2018-05-31 DIAGNOSIS — J3089 Other allergic rhinitis: Secondary | ICD-10-CM | POA: Diagnosis not present

## 2018-05-31 DIAGNOSIS — J301 Allergic rhinitis due to pollen: Secondary | ICD-10-CM | POA: Diagnosis not present

## 2018-06-02 DIAGNOSIS — H26491 Other secondary cataract, right eye: Secondary | ICD-10-CM | POA: Diagnosis not present

## 2018-06-02 DIAGNOSIS — E119 Type 2 diabetes mellitus without complications: Secondary | ICD-10-CM | POA: Diagnosis not present

## 2018-06-02 DIAGNOSIS — Z961 Presence of intraocular lens: Secondary | ICD-10-CM | POA: Diagnosis not present

## 2018-06-02 DIAGNOSIS — H35033 Hypertensive retinopathy, bilateral: Secondary | ICD-10-CM | POA: Diagnosis not present

## 2018-06-02 DIAGNOSIS — H532 Diplopia: Secondary | ICD-10-CM | POA: Diagnosis not present

## 2018-06-07 DIAGNOSIS — J3089 Other allergic rhinitis: Secondary | ICD-10-CM | POA: Diagnosis not present

## 2018-06-07 DIAGNOSIS — J301 Allergic rhinitis due to pollen: Secondary | ICD-10-CM | POA: Diagnosis not present

## 2018-06-14 DIAGNOSIS — J3089 Other allergic rhinitis: Secondary | ICD-10-CM | POA: Diagnosis not present

## 2018-06-14 DIAGNOSIS — J301 Allergic rhinitis due to pollen: Secondary | ICD-10-CM | POA: Diagnosis not present

## 2018-06-23 DIAGNOSIS — J3089 Other allergic rhinitis: Secondary | ICD-10-CM | POA: Diagnosis not present

## 2018-06-23 DIAGNOSIS — J301 Allergic rhinitis due to pollen: Secondary | ICD-10-CM | POA: Diagnosis not present

## 2018-06-28 DIAGNOSIS — J301 Allergic rhinitis due to pollen: Secondary | ICD-10-CM | POA: Diagnosis not present

## 2018-06-28 DIAGNOSIS — J3089 Other allergic rhinitis: Secondary | ICD-10-CM | POA: Diagnosis not present

## 2018-07-05 DIAGNOSIS — J301 Allergic rhinitis due to pollen: Secondary | ICD-10-CM | POA: Diagnosis not present

## 2018-07-05 DIAGNOSIS — J3089 Other allergic rhinitis: Secondary | ICD-10-CM | POA: Diagnosis not present

## 2018-07-12 DIAGNOSIS — J3089 Other allergic rhinitis: Secondary | ICD-10-CM | POA: Diagnosis not present

## 2018-07-12 DIAGNOSIS — J301 Allergic rhinitis due to pollen: Secondary | ICD-10-CM | POA: Diagnosis not present

## 2018-07-14 ENCOUNTER — Ambulatory Visit (AMBULATORY_SURGERY_CENTER): Payer: Self-pay | Admitting: *Deleted

## 2018-07-14 VITALS — Ht 69.0 in | Wt 236.0 lb

## 2018-07-14 DIAGNOSIS — D509 Iron deficiency anemia, unspecified: Secondary | ICD-10-CM

## 2018-07-14 DIAGNOSIS — R195 Other fecal abnormalities: Secondary | ICD-10-CM

## 2018-07-14 MED ORDER — NA SULFATE-K SULFATE-MG SULF 17.5-3.13-1.6 GM/177ML PO SOLN: 1.0000 | Freq: Once | ORAL | 0 refills | Status: AC

## 2018-07-14 NOTE — Progress Notes (Signed)
No egg or soy allergy known to patient  No issues with past sedation with any surgeries  or procedures, no intubation problems  No diet pills per patient No home 02 use per patient  Pt is on  blood thinners - pt on Plavix- ok to hold x 5 days per Dr Drue NovelPaz- See TE- last dose 9-7- Saturday  Pt denies issues with constipation  No A fib or A flutter  EMMI video sent to pt's e mail sent Egd and colon  Wife in [place] with pt today

## 2018-07-18 ENCOUNTER — Telehealth: Payer: Self-pay

## 2018-07-18 ENCOUNTER — Other Ambulatory Visit: Payer: Self-pay

## 2018-07-18 ENCOUNTER — Inpatient Hospital Stay (HOSPITAL_COMMUNITY): Payer: Medicare Other

## 2018-07-18 ENCOUNTER — Encounter (HOSPITAL_BASED_OUTPATIENT_CLINIC_OR_DEPARTMENT_OTHER): Payer: Self-pay | Admitting: Emergency Medicine

## 2018-07-18 ENCOUNTER — Emergency Department (HOSPITAL_BASED_OUTPATIENT_CLINIC_OR_DEPARTMENT_OTHER): Payer: Medicare Other

## 2018-07-18 ENCOUNTER — Inpatient Hospital Stay (HOSPITAL_BASED_OUTPATIENT_CLINIC_OR_DEPARTMENT_OTHER)
Admission: EM | Admit: 2018-07-18 | Discharge: 2018-07-20 | DRG: 193 | Disposition: A | Discharge status: Home / Self Care | Payer: Medicare Other | Attending: Internal Medicine | Admitting: Internal Medicine

## 2018-07-18 DIAGNOSIS — Z885 Allergy status to narcotic agent status: Secondary | ICD-10-CM | POA: Diagnosis not present

## 2018-07-18 DIAGNOSIS — J9601 Acute respiratory failure with hypoxia: Secondary | ICD-10-CM | POA: Diagnosis present

## 2018-07-18 DIAGNOSIS — Z86010 Personal history of colonic polyps: Secondary | ICD-10-CM | POA: Diagnosis not present

## 2018-07-18 DIAGNOSIS — Z7951 Long term (current) use of inhaled steroids: Secondary | ICD-10-CM | POA: Diagnosis not present

## 2018-07-18 DIAGNOSIS — E785 Hyperlipidemia, unspecified: Secondary | ICD-10-CM | POA: Diagnosis present

## 2018-07-18 DIAGNOSIS — Z7902 Long term (current) use of antithrombotics/antiplatelets: Secondary | ICD-10-CM

## 2018-07-18 DIAGNOSIS — J9600 Acute respiratory failure, unspecified whether with hypoxia or hypercapnia: Secondary | ICD-10-CM | POA: Diagnosis present

## 2018-07-18 DIAGNOSIS — Z79899 Other long term (current) drug therapy: Secondary | ICD-10-CM | POA: Diagnosis not present

## 2018-07-18 DIAGNOSIS — J440 Chronic obstructive pulmonary disease with acute lower respiratory infection: Secondary | ICD-10-CM | POA: Diagnosis not present

## 2018-07-18 DIAGNOSIS — H8109 Meniere's disease, unspecified ear: Secondary | ICD-10-CM | POA: Diagnosis present

## 2018-07-18 DIAGNOSIS — I10 Essential (primary) hypertension: Secondary | ICD-10-CM | POA: Diagnosis not present

## 2018-07-18 DIAGNOSIS — J4541 Moderate persistent asthma with (acute) exacerbation: Secondary | ICD-10-CM | POA: Diagnosis not present

## 2018-07-18 DIAGNOSIS — J9811 Atelectasis: Secondary | ICD-10-CM | POA: Diagnosis not present

## 2018-07-18 DIAGNOSIS — J181 Lobar pneumonia, unspecified organism: Secondary | ICD-10-CM | POA: Diagnosis not present

## 2018-07-18 DIAGNOSIS — Z8673 Personal history of transient ischemic attack (TIA), and cerebral infarction without residual deficits: Secondary | ICD-10-CM

## 2018-07-18 DIAGNOSIS — K219 Gastro-esophageal reflux disease without esophagitis: Secondary | ICD-10-CM | POA: Diagnosis not present

## 2018-07-18 DIAGNOSIS — Z96653 Presence of artificial knee joint, bilateral: Secondary | ICD-10-CM | POA: Diagnosis present

## 2018-07-18 DIAGNOSIS — Z888 Allergy status to other drugs, medicaments and biological substances status: Secondary | ICD-10-CM

## 2018-07-18 DIAGNOSIS — J189 Pneumonia, unspecified organism: Secondary | ICD-10-CM | POA: Diagnosis not present

## 2018-07-18 DIAGNOSIS — J45901 Unspecified asthma with (acute) exacerbation: Secondary | ICD-10-CM

## 2018-07-18 DIAGNOSIS — J441 Chronic obstructive pulmonary disease with (acute) exacerbation: Secondary | ICD-10-CM | POA: Diagnosis not present

## 2018-07-18 DIAGNOSIS — B359 Dermatophytosis, unspecified: Secondary | ICD-10-CM | POA: Diagnosis present

## 2018-07-18 DIAGNOSIS — F3131 Bipolar disorder, current episode depressed, mild: Secondary | ICD-10-CM | POA: Diagnosis not present

## 2018-07-18 DIAGNOSIS — Z87891 Personal history of nicotine dependence: Secondary | ICD-10-CM

## 2018-07-18 DIAGNOSIS — J4521 Mild intermittent asthma with (acute) exacerbation: Secondary | ICD-10-CM | POA: Diagnosis not present

## 2018-07-18 DIAGNOSIS — E1142 Type 2 diabetes mellitus with diabetic polyneuropathy: Secondary | ICD-10-CM | POA: Diagnosis not present

## 2018-07-18 DIAGNOSIS — F319 Bipolar disorder, unspecified: Secondary | ICD-10-CM | POA: Diagnosis present

## 2018-07-18 DIAGNOSIS — R0602 Shortness of breath: Secondary | ICD-10-CM | POA: Diagnosis not present

## 2018-07-18 LAB — BASIC METABOLIC PANEL
Anion gap: 12 (ref 5–15)
BUN: 26 mg/dL — ABNORMAL HIGH (ref 8–23)
CO2: 24 mmol/L (ref 22–32)
Calcium: 8.7 mg/dL — ABNORMAL LOW (ref 8.9–10.3)
Chloride: 103 mmol/L (ref 98–111)
Creatinine, Ser: 0.91 mg/dL (ref 0.61–1.24)
GFR calc Af Amer: >60 mL/min (ref >60)
GFR calc non Af Amer: >60 mL/min (ref >60)
Glucose, Bld: 275 mg/dL — ABNORMAL HIGH (ref 70–99)
Potassium: 4.2 mmol/L (ref 3.5–5.1)
Sodium: 139 mmol/L (ref 135–145)

## 2018-07-18 LAB — D-DIMER, QUANTITATIVE (NOT AT ARMC): D-Dimer, Quant: 0.66 ug/mL-FEU — ABNORMAL HIGH (ref 0.00–0.50)

## 2018-07-18 LAB — CBC
HCT: 36.2 % — ABNORMAL LOW (ref 39.0–52.0)
Hemoglobin: 11.7 g/dL — ABNORMAL LOW (ref 13.0–17.0)
MCH: 27.7 pg (ref 26.0–34.0)
MCHC: 32.3 g/dL (ref 30.0–36.0)
MCV: 85.8 fL (ref 78.0–100.0)
Platelets: 198 10*3/uL (ref 150–400)
RBC: 4.22 MIL/uL (ref 4.22–5.81)
RDW: 16 % — ABNORMAL HIGH (ref 11.5–15.5)
WBC: 12.2 10*3/uL — ABNORMAL HIGH (ref 4.0–10.5)

## 2018-07-18 LAB — CBC WITH DIFFERENTIAL/PLATELET
Basophils Absolute: 0 10*3/uL (ref 0.0–0.1)
Basophils Relative: 0 %
Eosinophils Absolute: 0.1 10*3/uL (ref 0.0–0.7)
Eosinophils Relative: 1 %
HCT: 39.4 % (ref 39.0–52.0)
Hemoglobin: 12.9 g/dL — ABNORMAL LOW (ref 13.0–17.0)
Lymphocytes Relative: 21 %
Lymphs Abs: 2.2 10*3/uL (ref 0.7–4.0)
MCH: 27.9 pg (ref 26.0–34.0)
MCHC: 32.7 g/dL (ref 30.0–36.0)
MCV: 85.1 fL (ref 78.0–100.0)
Monocytes Absolute: 0.6 10*3/uL (ref 0.1–1.0)
Monocytes Relative: 5 %
Neutro Abs: 7.7 10*3/uL (ref 1.7–7.7)
Neutrophils Relative %: 73 %
Platelets: 239 10*3/uL (ref 150–400)
RBC: 4.63 MIL/uL (ref 4.22–5.81)
RDW: 16.2 % — ABNORMAL HIGH (ref 11.5–15.5)
WBC: 10.5 10*3/uL (ref 4.0–10.5)

## 2018-07-18 LAB — I-STAT CG4 LACTIC ACID, ED: Lactic Acid, Venous: 2.3 mmol/L (ref 0.5–1.9)

## 2018-07-18 LAB — HEMOGLOBIN A1C
Hgb A1c MFr Bld: 6.4 % — ABNORMAL HIGH (ref 4.8–5.6)
Mean Plasma Glucose: 136.98 mg/dL

## 2018-07-18 LAB — CREATININE, SERUM
Creatinine, Ser: 1.01 mg/dL (ref 0.61–1.24)
GFR calc Af Amer: >60 mL/min (ref >60)
GFR calc non Af Amer: >60 mL/min (ref >60)

## 2018-07-18 LAB — TROPONIN I: Troponin I: <0.03 ng/mL (ref <0.03)

## 2018-07-18 LAB — GLUCOSE, CAPILLARY
Glucose-Capillary: 288 mg/dL — ABNORMAL HIGH (ref 70–99)
Glucose-Capillary: 319 mg/dL — ABNORMAL HIGH (ref 70–99)
Glucose-Capillary: 180 mg/dL — ABNORMAL HIGH (ref 70–99)

## 2018-07-18 MED ORDER — IPRATROPIUM BROMIDE 0.02 % IN SOLN: 0.5000 mg | Freq: Once | RESPIRATORY_TRACT | Status: DC

## 2018-07-18 MED ORDER — SIMVASTATIN 40 MG PO TABS
40.0000 mg | ORAL_TABLET | Freq: Every day | ORAL | Status: DC
  Administered 2018-07-18 – 2018-07-19 (×2): 40 mg via ORAL
  Filled 2018-07-18 (×2): qty 1

## 2018-07-18 MED ORDER — BUDESONIDE 0.25 MG/2ML IN SUSP
0.2500 mg | Freq: Two times a day (BID) | RESPIRATORY_TRACT | Status: DC
  Administered 2018-07-18 – 2018-07-20 (×4): 0.25 mg via RESPIRATORY_TRACT
  Filled 2018-07-18 (×4): qty 2

## 2018-07-18 MED ORDER — SODIUM CHLORIDE 0.9 % IV SOLN
500.0000 mg | Freq: Once | INTRAVENOUS | Status: AC
  Administered 2018-07-18: 500 mg via INTRAVENOUS
  Filled 2018-07-18: qty 500

## 2018-07-18 MED ORDER — ONE-DAILY MULTI VITAMINS PO TABS: 1.0000 | ORAL_TABLET | Freq: Every day | ORAL | Status: DC

## 2018-07-18 MED ORDER — IPRATROPIUM BROMIDE 0.02 % IN SOLN
0.5000 mg | Freq: Three times a day (TID) | RESPIRATORY_TRACT | Status: DC
  Administered 2018-07-19 – 2018-07-20 (×5): 0.5 mg via RESPIRATORY_TRACT
  Filled 2018-07-18 (×5): qty 2.5

## 2018-07-18 MED ORDER — ADULT MULTIVITAMIN W/MINERALS CH
1.0000 | ORAL_TABLET | Freq: Every day | ORAL | Status: DC
  Administered 2018-07-18 – 2018-07-20 (×3): 1 via ORAL
  Filled 2018-07-18 (×3): qty 1

## 2018-07-18 MED ORDER — SODIUM CHLORIDE 0.9 % IV SOLN
INTRAVENOUS | Status: DC
  Administered 2018-07-18 – 2018-07-19 (×2): via INTRAVENOUS

## 2018-07-18 MED ORDER — IPRATROPIUM BROMIDE 0.02 % IN SOLN
0.5000 mg | RESPIRATORY_TRACT | Status: DC
  Administered 2018-07-18 (×2): 0.5 mg via RESPIRATORY_TRACT
  Filled 2018-07-18 (×2): qty 2.5

## 2018-07-18 MED ORDER — PANTOPRAZOLE SODIUM 40 MG PO TBEC
40.0000 mg | DELAYED_RELEASE_TABLET | Freq: Every day | ORAL | Status: DC
  Administered 2018-07-18 – 2018-07-20 (×3): 40 mg via ORAL
  Filled 2018-07-18 (×3): qty 1

## 2018-07-18 MED ORDER — ARFORMOTEROL TARTRATE 15 MCG/2ML IN NEBU
15.0000 ug | INHALATION_SOLUTION | Freq: Two times a day (BID) | RESPIRATORY_TRACT | Status: DC
  Administered 2018-07-18 – 2018-07-20 (×4): 15 ug via RESPIRATORY_TRACT
  Filled 2018-07-18 (×4): qty 2

## 2018-07-18 MED ORDER — LEVALBUTEROL HCL 0.63 MG/3ML IN NEBU: 0.6300 mg | INHALATION_SOLUTION | Freq: Four times a day (QID) | RESPIRATORY_TRACT | Status: DC | PRN

## 2018-07-18 MED ORDER — SODIUM CHLORIDE 0.9 % IV BOLUS
1000.0000 mL | Freq: Once | INTRAVENOUS | Status: AC
  Administered 2018-07-18: 1000 mL via INTRAVENOUS

## 2018-07-18 MED ORDER — ASENAPINE MALEATE 5 MG SL SUBL
5.0000 mg | SUBLINGUAL_TABLET | Freq: Every day | SUBLINGUAL | Status: DC
  Administered 2018-07-18 – 2018-07-19 (×2): 5 mg via SUBLINGUAL
  Filled 2018-07-18 (×2): qty 1

## 2018-07-18 MED ORDER — TRAZODONE HCL 50 MG PO TABS: 150.0000 mg | ORAL_TABLET | Freq: Every evening | ORAL | Status: DC | PRN

## 2018-07-18 MED ORDER — LEVALBUTEROL HCL 0.63 MG/3ML IN NEBU
0.6300 mg | INHALATION_SOLUTION | Freq: Three times a day (TID) | RESPIRATORY_TRACT | Status: DC
  Administered 2018-07-19 – 2018-07-20 (×5): 0.63 mg via RESPIRATORY_TRACT
  Filled 2018-07-18 (×5): qty 3

## 2018-07-18 MED ORDER — CLONAZEPAM 1 MG PO TABS
1.0000 mg | ORAL_TABLET | Freq: Every day | ORAL | Status: DC
  Administered 2018-07-19 – 2018-07-20 (×2): 1 mg via ORAL
  Filled 2018-07-18 (×2): qty 1

## 2018-07-18 MED ORDER — ALBUTEROL SULFATE (2.5 MG/3ML) 0.083% IN NEBU: 5.0000 mg | INHALATION_SOLUTION | Freq: Once | RESPIRATORY_TRACT | Status: DC

## 2018-07-18 MED ORDER — INSULIN ASPART 100 UNIT/ML ~~LOC~~ SOLN
0.0000 [IU] | Freq: Every day | SUBCUTANEOUS | Status: DC
  Administered 2018-07-19: 2 [IU] via SUBCUTANEOUS

## 2018-07-18 MED ORDER — SODIUM CHLORIDE 0.9 % IV SOLN
1.0000 g | Freq: Once | INTRAVENOUS | Status: AC
  Administered 2018-07-18: 1 g via INTRAVENOUS
  Filled 2018-07-18: qty 10

## 2018-07-18 MED ORDER — ALBUTEROL SULFATE (2.5 MG/3ML) 0.083% IN NEBU
5.0000 mg | INHALATION_SOLUTION | Freq: Once | RESPIRATORY_TRACT | Status: AC
  Administered 2018-07-18: 5 mg via RESPIRATORY_TRACT
  Filled 2018-07-18: qty 6

## 2018-07-18 MED ORDER — AZITHROMYCIN 500 MG IV SOLR
INTRAVENOUS | Status: AC
  Filled 2018-07-18: qty 500

## 2018-07-18 MED ORDER — FENTANYL CITRATE (PF) 100 MCG/2ML IJ SOLN: 25.0000 ug | INTRAMUSCULAR | Status: DC | PRN

## 2018-07-18 MED ORDER — IOPAMIDOL (ISOVUE-370) INJECTION 76%
INTRAVENOUS | Status: AC
  Filled 2018-07-18: qty 100

## 2018-07-18 MED ORDER — HEPARIN SODIUM (PORCINE) 5000 UNIT/ML IJ SOLN
5000.0000 [IU] | Freq: Three times a day (TID) | INTRAMUSCULAR | Status: DC
  Administered 2018-07-18 – 2018-07-20 (×6): 5000 [IU] via SUBCUTANEOUS
  Filled 2018-07-18 (×6): qty 1

## 2018-07-18 MED ORDER — SODIUM CHLORIDE 0.9 % IV SOLN
500.0000 mg | INTRAVENOUS | Status: DC
  Administered 2018-07-19: 500 mg via INTRAVENOUS
  Filled 2018-07-18: qty 500

## 2018-07-18 MED ORDER — TRAMADOL HCL 50 MG PO TABS
50.0000 mg | ORAL_TABLET | Freq: Four times a day (QID) | ORAL | Status: DC | PRN
  Administered 2018-07-18: 50 mg via ORAL
  Filled 2018-07-18: qty 1

## 2018-07-18 MED ORDER — CLOPIDOGREL BISULFATE 75 MG PO TABS
75.0000 mg | ORAL_TABLET | Freq: Every day | ORAL | Status: DC
  Administered 2018-07-18 – 2018-07-20 (×3): 75 mg via ORAL
  Filled 2018-07-18 (×3): qty 1

## 2018-07-18 MED ORDER — DULOXETINE HCL 60 MG PO CPEP
60.0000 mg | ORAL_CAPSULE | Freq: Every day | ORAL | Status: DC
  Administered 2018-07-18 – 2018-07-20 (×3): 60 mg via ORAL
  Filled 2018-07-18 (×3): qty 1

## 2018-07-18 MED ORDER — LORATADINE 10 MG PO TABS
10.0000 mg | ORAL_TABLET | Freq: Every day | ORAL | Status: DC
  Administered 2018-07-18 – 2018-07-20 (×3): 10 mg via ORAL
  Filled 2018-07-18 (×3): qty 1

## 2018-07-18 MED ORDER — FLUTICASONE FUROATE 27.5 MCG/SPRAY NA SUSP: 2.0000 | Freq: Every day | NASAL | Status: DC

## 2018-07-18 MED ORDER — IPRATROPIUM-ALBUTEROL 0.5-2.5 (3) MG/3ML IN SOLN
3.0000 mL | Freq: Four times a day (QID) | RESPIRATORY_TRACT | Status: DC
  Administered 2018-07-18: 3 mL via RESPIRATORY_TRACT

## 2018-07-18 MED ORDER — SODIUM CHLORIDE 0.9 % IV SOLN
1.0000 g | INTRAVENOUS | Status: DC
  Administered 2018-07-19 – 2018-07-20 (×2): 1 g via INTRAVENOUS
  Filled 2018-07-18 (×2): qty 1

## 2018-07-18 MED ORDER — IPRATROPIUM-ALBUTEROL 0.5-2.5 (3) MG/3ML IN SOLN
3.0000 mL | Freq: Four times a day (QID) | RESPIRATORY_TRACT | Status: DC
  Administered 2018-07-18: 3 mL via RESPIRATORY_TRACT
  Filled 2018-07-18 (×2): qty 3

## 2018-07-18 MED ORDER — PREDNISONE 50 MG PO TABS
60.0000 mg | ORAL_TABLET | Freq: Once | ORAL | Status: AC
  Administered 2018-07-18: 60 mg via ORAL
  Filled 2018-07-18: qty 1

## 2018-07-18 MED ORDER — IOPAMIDOL (ISOVUE-370) INJECTION 76%
100.0000 mL | Freq: Once | INTRAVENOUS | Status: AC | PRN
  Administered 2018-07-18: 100 mL via INTRAVENOUS

## 2018-07-18 MED ORDER — FLUTICASONE PROPIONATE 50 MCG/ACT NA SUSP
2.0000 | Freq: Every day | NASAL | Status: DC
  Filled 2018-07-18: qty 16

## 2018-07-18 MED ORDER — ALBUTEROL SULFATE (2.5 MG/3ML) 0.083% IN NEBU
2.5000 mg | INHALATION_SOLUTION | Freq: Once | RESPIRATORY_TRACT | Status: AC
  Administered 2018-07-18: 2.5 mg via RESPIRATORY_TRACT
  Filled 2018-07-18: qty 3

## 2018-07-18 MED ORDER — CLONAZEPAM 0.5 MG PO TABS: 0.5000 mg | ORAL_TABLET | Freq: Two times a day (BID) | ORAL | Status: DC

## 2018-07-18 MED ORDER — LEVALBUTEROL HCL 0.63 MG/3ML IN NEBU
0.6300 mg | INHALATION_SOLUTION | RESPIRATORY_TRACT | Status: DC
  Administered 2018-07-18 (×2): 0.63 mg via RESPIRATORY_TRACT
  Filled 2018-07-18 (×2): qty 3

## 2018-07-18 MED ORDER — CLONAZEPAM 0.5 MG PO TABS
0.5000 mg | ORAL_TABLET | Freq: Every evening | ORAL | Status: DC
  Administered 2018-07-18: 0.5 mg via ORAL
  Filled 2018-07-18: qty 1

## 2018-07-18 MED ORDER — METHYLPREDNISOLONE SODIUM SUCC 125 MG IJ SOLR
60.0000 mg | Freq: Three times a day (TID) | INTRAMUSCULAR | Status: DC
  Administered 2018-07-18 – 2018-07-19 (×4): 60 mg via INTRAVENOUS
  Filled 2018-07-18 (×4): qty 2

## 2018-07-18 MED ORDER — INSULIN ASPART 100 UNIT/ML ~~LOC~~ SOLN
0.0000 [IU] | Freq: Three times a day (TID) | SUBCUTANEOUS | Status: DC
  Administered 2018-07-18: 11 [IU] via SUBCUTANEOUS
  Administered 2018-07-19 (×2): 5 [IU] via SUBCUTANEOUS
  Administered 2018-07-19: 3 [IU] via SUBCUTANEOUS
  Administered 2018-07-20 (×2): 5 [IU] via SUBCUTANEOUS

## 2018-07-18 MED ORDER — LOSARTAN POTASSIUM 25 MG PO TABS
25.0000 mg | ORAL_TABLET | Freq: Every day | ORAL | Status: DC
  Administered 2018-07-18 – 2018-07-20 (×3): 25 mg via ORAL
  Filled 2018-07-18 (×3): qty 1

## 2018-07-18 MED ORDER — FERROUS SULFATE 325 (65 FE) MG PO TABS
325.0000 mg | ORAL_TABLET | Freq: Two times a day (BID) | ORAL | Status: DC
  Administered 2018-07-18 – 2018-07-20 (×3): 325 mg via ORAL
  Filled 2018-07-18 (×3): qty 1

## 2018-07-18 MED ORDER — IPRATROPIUM-ALBUTEROL 0.5-2.5 (3) MG/3ML IN SOLN
3.0000 mL | Freq: Once | RESPIRATORY_TRACT | Status: AC
  Administered 2018-07-18: 3 mL via RESPIRATORY_TRACT
  Filled 2018-07-18: qty 3

## 2018-07-18 NOTE — Plan of Care (Signed)
Called for admission from [facility] by Dr. Dalene Seltzer  Briefly 72 year old male with history of asthma, chronic anemia, presenting with shortness of breath, coughing and wheezing for last 3 days.  Patient had tried inhalers at home with no significant relief. Currently no fevers.  BP 125/74   Pulse (!) 104   Temp 98 F (36.7 C) (Oral)   Resp 17   Ht 5\' 9"  (1.753 m)   Wt 106.6 kg   SpO2 97%   BMI 34.70 kg/m    CXR showed left lower lobe opacity question atelectasis versus pneumonia  Accepted to inpatient telemetry at [facility] M[facility]D. [facility] 07/18/2018, 10:29 AM  Pager: [phone]

## 2018-07-18 NOTE — ED Notes (Signed)
Reports has been called to 4 west at [facility], spoke to [place], [place]

## 2018-07-18 NOTE — Telephone Encounter (Signed)
Thanks for letting me know.  Admitting history and physical indicates he has pneumonia.  So his ECL should be postponed.    Cancel 9/13 procedures, let his family know by phone, and then set an office visit with me or APP in 4-5 weeks to re-evaluate his condition.

## 2018-07-18 NOTE — Telephone Encounter (Signed)
Spoke to patient's wife to let her know that we are cancelling his upcoming procedure to give him time to recover from pneumonia. Dr. Myrtie Neither wants him to be seen in a few weeks to re-evaluate his condition, reschedule endo colon at that time.

## 2018-07-18 NOTE — ED Provider Notes (Signed)
Received care of patient from Dr. Bebe Shaggy.  Please see his note for prior history and physical.  Briefly this is a 72-year-old male who presents with concern for cough and shortness of breath.  Reports he has had generalized weakness over the last several days, with cough and shortness of breath beginning last night.  Chest x-ray shows concern for pneumonia.  Have low suspicion for pulmonary embolus given patient without recent travel, no recent surgeries, no history of DVT or PE, no asymmetric leg swelling, and feel more generalized symptoms leading up to cough and shortness of breath are more consistent with pneumonia.  Patient with likely combination of COPD exacerbation and pneumonia.  He was given prednisone 60 mg by Dr. Bebe Shaggy in addition to nebulizer treatments, and I initiated Rocephin and azithromycin for treatment of community-acquired pneumonia.  Will reevaluate patient's response to treatment and if no improvement in HR or WOB will admit for treatment of pneumonia.  Patient with continued tachypnea, dyspnea, mild tachycardia.  Blood cultures and lactate sent, patient admitted to [facility] long for further care.  CRITICAL CARE:sepsis, tachycardia, tachypnea, pneumonia, 2 abx Performed by: Lynnea Ferrier   Total critical care time: 30 minutes  Critical care time was exclusive of separately billable procedures and treating other patients.  Critical care was necessary to treat or prevent imminent or life-threatening deterioration.  Critical care was time spent personally by me on the following activities: development of treatment plan with patient and/or surrogate as well as nursing, discussions with consultants, evaluation of patient's response to treatment, examination of patient, obtaining history from patient or surrogate, ordering and performing treatments and interventions, ordering and review of laboratory studies, ordering and review of radiographic studies, pulse oximetry and  re-evaluation of patient's condition.   Alvira Monday, MD 07/19/18 [phone]

## 2018-07-18 NOTE — Telephone Encounter (Signed)
[facility] chart prep noticed that patient was admitted today, he has endocolon scheduled for 9/13. Do you want to reschedule patient?

## 2018-07-18 NOTE — ED Triage Notes (Signed)
Pt c/o SOB for the past 3 days not getting better with neb treatments at home. Last neb treatment today at 5 am.

## 2018-07-18 NOTE — ED Notes (Signed)
Reports given to [facility] , en route to transport the pt.

## 2018-07-18 NOTE — ED Notes (Signed)
Ambulated to RR without O2.  RR 26, HR 118, SpO2 96% upon return to room.  Increased WOB, + DOE,  Placed back on 2l/m in room.

## 2018-07-18 NOTE — H&P (Signed)
History and Physical        Hospital Admission Note Date: 07/18/2018  Patient name: Ryan Zhang Medical record number: [id number] Date of birth: 01/19/1946 Age: 71 y.o. Gender: male  PCP: Colon Branch, MD    Patient coming from: Home  I have reviewed all records in the [facility].    Chief Complaint:  Shortness of breath with wheezing since 2 AM in the morning  HPI: Patient is a 72 year old male with history of moderate persistent asthma, bipolar disorder, depression, diabetes, GERD, hypertension, hyperlipidemia, history of stroke presented to ED with acute shortness of breath with wheezing since morning.  Patient reported that he was not feeling well in the last 2 days however early this morning around 2 AM, he woke up, short of breath with wheezing.  He also had left-sided chest heaviness with wheezing, worse on deep breathing, 6/10, with no radiation.  At the time of my examination, chest pain had resolved.  Patient tried albuterol inhalers at home with no significant relief.  He also reported dry cough but no fevers or chills.  He denied any recent sick contacts or antibiotic use.  No recent long distance car rides or air flights or leg swelling.  No orthopnea or PND.   ED work-up/course:  Temp 98.3, pulse 120, BP 142/76, initially at the time of triage O2 sats were 90%, patient was placed on 2 L Sodium 139, potassium 4.2, BUN 26, creatinine 0.91, troponin less than 0.03, lactic acid 2.3 WBC is 10.5, hemoglobin 12.9 EKG showed rate 115, sinus tachycardia  Review of Systems: Positives marked in 'bold' Constitutional: Denies fever, chills, diaphoresis, + poor appetite and fatigue.  HEENT: Denies photophobia, eye pain, redness, hearing loss, ear pain, congestion, sore throat, rhinorrhea, sneezing, mouth sores, trouble swallowing, neck pain, neck stiffness and tinnitus.    Respiratory: Please see HPI Cardiovascular: No palpitations and leg swelling.  Gastrointestinal: Denies nausea, vomiting, abdominal pain, diarrhea, constipation, blood in stool and abdominal distention.  Genitourinary: Denies dysuria, urgency, frequency, hematuria, flank pain and difficulty urinating.  Musculoskeletal: Denies myalgias, back pain, joint swelling, arthralgias and gait problem.  Skin: Denies pallor, rash and wound.  Neurological: Denies dizziness, seizures, syncope, weakness, light-headedness, numbness and headaches.  Hematological: Denies adenopathy. Easy bruising, personal or family bleeding history  Psychiatric/Behavioral: Denies suicidal ideation, mood changes, confusion, nervousness, sleep disturbance and agitation  Past Medical History: Past Medical History:  Diagnosis Date  . Allergy    allergy shots Dr. Velora Heckler  . Anemia   . Anxiety   . Asthma    moderate persistant  . Bipolar affective ([place])   . Depression   . Diabetes mellitus   . DJD (degenerative joint disease)   . Dysphagia   . Gallstones    hx of, s/p cholecystectomy  . GERD (gastroesophageal reflux disease)   . Hepatitis A    as teenager 60's  . Hollenhorst plaque    right eye  . Hyperlipidemia   . Hypertension   . Kidney stones    hx of  . Meniere's disease   . Motility disorder, esophageal   . Obesity   . Pancreatitis   . Personal  history of colonic polyps 11/2004   hyperplastic.  . Stroke [facility])    per MRI    Past Surgical History:  Procedure Laterality Date  . CATARACT EXTRACTION Bilateral 09/2015 and 10/2015  . CHOLECYSTECTOMY    . COLONOSCOPY    . TOTAL KNEE ARTHROPLASTY Bilateral    both knees    Medications: Prior to Admission medications   Medication Sig Start Date End Date Taking? Authorizing Provider  albuterol (PROVENTIL HFA;VENTOLIN HFA) 108 (90 Base) MCG/ACT inhaler Inhale 2 puffs into the lungs every 6 (six) hours as needed for wheezing or shortness of breath.  01/24/18   Colon Branch, MD  albuterol (PROVENTIL) (2.5 MG/3ML) 0.083% nebulizer solution Take 3 mLs (2.5 mg total) by nebulization every 6 (six) hours as needed for wheezing or shortness of breath. 01/24/18   Colon Branch, MD  asenapine (SAPHRIS) 5 MG SUBL Place 5 mg under the tongue at bedtime.     [provider]  augmented betamethasone dipropionate (DIPROLENE-AF) 0.05 % ointment Apply topically 2 (two) times daily as needed. 03/13/18   Colon Branch, MD  budesonide-formoterol [facility]) 160-4.5 MCG/ACT inhaler Inhale 2 puffs into the lungs 2 (two) times daily.     [provider]  clonazePAM (KLONOPIN) 0.5 MG tablet Take 0.5 mg by mouth 3 (three) times daily. 1 mg in the morning and 0.5 mg in the evening.    [provider]  clopidogrel (PLAVIX) 75 MG tablet Take 1 tablet (75 mg total) by mouth daily. 03/27/18   Colon Branch, MD  clotrimazole-betamethasone (LOTRISONE) cream Apply 1 application topically 2 (two) times daily. 05/23/17   Colon Branch, MD  DULoxetine (CYMBALTA) 60 MG capsule Take 60 mg by mouth every morning.     [provider]  EPIPEN 2-PAK 0.3 MG/0.3ML SOAJ injection Reported on 04/29/2016 07/04/14   [provider]  esomeprazole (NEXIUM) 40 MG capsule Take 40 mg by mouth daily.     [provider]  ferrous sulfate 325 (65 FE) MG tablet Take 1 tablet (325 mg total) by mouth 2 (two) times daily with a meal. 02/15/18   Colon Branch, MD  fexofenadine (ALLEGRA) 180 MG tablet Take 180 mg by mouth every morning.     [provider]  fluticasone (VERAMYST) 27.5 MCG/SPRAY nasal spray Place 2 sprays into the nose daily as needed.     [provider]  glucose blood (ONETOUCH VERIO) test strip Check blood sugar no more than twice daily 12/12/17   Colon Branch, MD  ipratropium (ATROVENT) 0.06 % nasal spray Place 1-2 sprays into both nostrils 4 (four) times daily as needed for rhinitis.     [provider]  lamoTRIgine (LAMICTAL)  25 MG tablet Take 50 mg by mouth at bedtime.    [provider]  Lancets [name] ULTRASOFT) lancets Check blood sugars no more than twice daily 12/09/16   Colon Branch, MD  losartan (COZAAR) 25 MG tablet Take 1 tablet (25 mg total) by mouth daily. 05/08/18   Colon Branch, MD  Multiple Vitamin (MULTIVITAMIN) tablet Take 1 tablet by mouth daily. Publishing copy, Historical, MD  [place] complex Supplement daily over the counter    [provider]  pioglitazone-metformin (ACTOPLUS MET) 15-850 MG tablet Take 1 tablet by mouth 2 (two) times daily with a meal. 05/08/18   Colon Branch, MD  simvastatin (ZOCOR) 40 MG tablet Take 1 tablet (40  mg total) by mouth at bedtime. 04/17/18   Colon Branch, MD  traZODone (DESYREL) 50 MG tablet Take 150 mg by mouth at bedtime as needed for sleep.  07/09/14   Midge Minium, MD    Allergies:   Allergies  Allergen Reactions  . Smz-Tmp Ds [Sulfamethoxazole-Trimethoprim] Nausea And Vomiting  . Divalproex Sodium Other (See Comments)    Pancreatitis   . Methocarbamol Other (See Comments)    hallucinations  . Other     "Symbrax" alteration in blood sugar, pt unsure if low or high blood sugar  . Paroxetine Other (See Comments)    Insomnia  . Percocet [Oxycodone-Acetaminophen] Other (See Comments)    Hyperactivity  . Risperidone Other (See Comments)    REACTION: hyper  . Wellbutrin [Bupropion] Other (See Comments)    Hot flashes  . Citalopram Hydrobromide Other (See Comments)    Insomnia    Social History:  reports that he quit smoking about 53 years ago. He has never used smokeless tobacco. He reports that he drank alcohol. He reports that he does not use drugs.  Family History: Family History  Problem Relation Age of Onset  . Diabetes Father   . Heart disease Brother   . Heart disease Maternal Grandmother   . Cancer Sister        unknown type  . Colon cancer Neg Hx   . Prostate cancer Neg  Hx   . Colon polyps Neg Hx   . Esophageal cancer Neg Hx   . Rectal cancer Neg Hx   . Stomach cancer Neg Hx     Physical Exam: Blood pressure (!) 141/75, pulse (!) 105, temperature 98 F (36.7 C), temperature source Oral, resp. rate (!) 24, height 5' 9"  (1.753 m), weight 106.6 kg, SpO2 99 %. General: Alert, awake, oriented x3, in no acute distress. Eyes: pink conjunctiva,anicteric sclera, pupils equal and reactive to light and accomodation, HEENT: normocephalic, atraumatic, oropharynx clear Neck: supple, no masses or lymphadenopathy, no goiter, no bruits, no JVD CVS: Regular rate and rhythm, without murmurs, rubs or gallops. No lower extremity edema Resp : Diffuse expiratory wheezing bilaterally GI : Soft, nontender, nondistended, positive bowel sounds, no masses. No hepatomegaly. No hernia.  Musculoskeletal: No clubbing or cyanosis, positive pedal pulses. No contracture. ROM intact  Neuro: Grossly intact, no focal neurological deficits, strength 5/5 upper and lower extremities bilaterally Psych: alert and oriented x 3, normal mood and affect Skin: no rashes or lesions, warm and dry   LABS on Admission: I have personally reviewed all the labs and imagings below    Basic Metabolic Panel: Recent Labs  Lab 07/18/18 0619  NA 139  K 4.2  CL 103  CO2 24  GLUCOSE 275*  BUN 26*  CREATININE 0.91  CALCIUM 8.7*   Liver Function Tests: No results for input(s): AST, ALT, ALKPHOS, BILITOT, PROT, ALBUMIN in the last 168 hours. No results for input(s): LIPASE, AMYLASE in the last 168 hours. No results for input(s): AMMONIA in the last 168 hours. CBC: Recent Labs  Lab 07/18/18 0619  WBC 10.5  NEUTROABS 7.7  HGB 12.9*  HCT 39.4  MCV 85.1  PLT 239   Cardiac Enzymes: Recent Labs  Lab 07/18/18 0802  TROPONINI <0.03   BNP: Invalid input(s): POCBNP CBG: No results for input(s): GLUCAP in the last 168 hours.  Radiological Exams on Admission:  Dg Chest 2 View  Result Date:  07/18/2018 CLINICAL DATA:  Increased shortness of breath, COPD, asthma, diabetes mellitus, hypertension,  former smoker EXAM: CHEST - 2 VIEW COMPARISON:  01/24/2018 FINDINGS: Minimal enlargement cardiac silhouette. Mediastinal contours and pulmonary vascularity normal. Bronchitic changes with mild RIGHT basilar atelectasis. Increased LEFT lower lobe opacity question atelectasis versus consolidation. Upper lungs clear. No pleural effusion or pneumothorax. Bones demineralized. Surgical clips RIGHT upper quadrant. IMPRESSION: LEFT lower lobe opacity question atelectasis versus pneumonia. Electronically Signed   By: Lavonia Dana M.D.   On: 07/18/2018 07:08      EKG: Independently reviewed.  Rate 115, sinus tachycardia   Assessment/Plan Principal Problem:   Acute respiratory failure (HCC) with hypoxia, secondary to asthma exacerbation, community acquired pneumonia -Currently wheezing, placed on scheduled nebs with Xopenex and Atrovent due to tachycardia, continue O2 supplementation -Placed on Pulmicort, Brovana, flutter valve, Solu-Medrol 60 mg IV every 8 hours -Continue IV Rocephin, Zithromax, -Obtain blood cultures, d-dimer, sputum cultures, urine Legionella antigen, urine strep antigen  Active Problems:   DM type 2 with diabetic peripheral neuropathy (HCC) -Hold oral hypoglycemics -Placed on sliding scale insulin, moderate, obtain hemoglobin A1c    Bipolar disorder (HCC) -Currently stable, will continue psych meds, pharmacy consulted to reconcile meds for accuracy    Essential hypertension, benign -BP currently stable -Continue losartan    GERD -Continue Protonix daily  History of prior stroke -Tinea Plavix   DVT prophylaxis: Heparin subcu  CODE STATUS: Full CODE STATUS  Consults called: None  Family Communication: Admission, patients condition and plan of care including tests being ordered have been discussed with the patient who indicates understanding and agree with the plan  and Code Status  Admission status: Inpatient, telemetry  Disposition plan: Further plan will depend as patient's clinical course evolves and further radiologic and laboratory data become available.    At the time of admission, it appears that the appropriate admission status for this patient is INPATIENT . This is judged to be reasonable and necessary in order to provide the required intensity of service to ensure the patient's safety given the presenting symptoms acute shortness of breath with diffuse wheezing, left-sided infiltrates suggesting pneumonia, physical exam findings, and initial radiographic and laboratory data in the context of their chronic comorbidities.  The medical decision making on this patient was of high complexity and the patient is at high risk for clinical deterioration, therefore this is a level 3 visit.    Time Spent on Admission: 48mns     Azaliah Carrero M.D. [facility] 07/18/2018, 12:59 PM Pager: [phone] If 7PM-7AM, please contact night-coverage [email or web address] Password [email or web address]

## 2018-07-18 NOTE — ED Provider Notes (Signed)
[facility] EMERGENCY DEPARTMENT Provider Note   CSN: [id number] Arrival date & time: 07/18/18  0601     History   Chief Complaint Chief Complaint  Patient presents with  . Shortness of Breath    HPI Ryan Zhang is a 72 y.o. male.  The history is provided by the patient.  Shortness of Breath  This is a new problem. The average episode lasts 3 days. The problem occurs frequently.The problem has been gradually worsening. Associated symptoms include cough and wheezing. Pertinent negatives include no fever, no hemoptysis and no leg swelling. He has tried beta-agonist inhalers for the symptoms. The treatment provided no relief. Associated medical issues include asthma.   Patient with history of asthma, bipolar presents with shortness of breath and chest pain.  He reports not feeling well for the past several days, and feeling short of breath.  Tonight he woke up feeling more short of breath with chest pain cough.  No hemoptysis.  He reports chest pain is worse with breathing and coughing.  He is not a current smoker. Past Medical History:  Diagnosis Date  . Allergy    allergy shots Dr. Velora Heckler  . Anemia   . Anxiety   . Asthma    moderate persistant  . Bipolar affective ([place])   . Depression   . Diabetes mellitus   . DJD (degenerative joint disease)   . Dysphagia   . Gallstones    hx of, s/p cholecystectomy  . GERD (gastroesophageal reflux disease)   . Hepatitis A    as teenager 65's  . Hollenhorst plaque    right eye  . Hyperlipidemia   . Hypertension   . Kidney stones    hx of  . Meniere's disease   . Motility disorder, esophageal   . Obesity   . Pancreatitis   . Personal history of colonic polyps 11/2004   hyperplastic.  . Stroke [facility])    per MRI    Patient Active Problem List   Diagnosis Date Noted  . PCP NOTES >>>>>>>>>>>>>>>>>>>>>>>>>>>>>>> 01/12/2016  . Dry skin 05/09/2015  . Folliculitis 90/93/1121  . Hearing loss in right ear 08/05/2014  .  Pre-ulcerative corn or callous 12/27/2013  . Panic attack as reaction to stress 04/20/2012  . Dysphagia, neurologic 01/07/2012  . Morbid obesity ([place]) 01/07/2012  . Chest pain, musculoskeletal 12/07/2011  . Black-out (not amnesia) 12/07/2011  . Candidiasis of skin 08/12/2011  . Annual physical exam 06/07/2011  . OLECRANON BURSITIS 01/29/2011  . PARTIAL ARTERIAL OCCLUSION OF RETINA 05/27/2010  . ESSENTIAL HYPERTENSION, BENIGN 01/16/2010  . ABNORMAL ELECTROCARDIOGRAM 01/14/2010  . ASTHMA, MILD, INTERMITTENT 10/14/2009  . GERD 10/14/2009  . LACERATION OF FINGER 05/14/2009  . Bipolar disorder ([place]) 02/20/2009  . PHIMOSIS 02/20/2009  . KNEE PAIN, CHRONIC 11/20/2008  . HIP PAIN, RIGHT 08/19/2008  . DM type 2 with diabetic peripheral neuropathy ([place]) 02/22/2007  . Hyperlipidemia associated with type 2 diabetes mellitus ([place]) 02/22/2007  . ALLERGIC RHINITIS, SEASONAL 02/22/2007  . LOW BACK PAIN, CHRONIC 02/22/2007  . MENIERE'S DISEASE, HX OF 02/22/2007    Past Surgical History:  Procedure Laterality Date  . CATARACT EXTRACTION Bilateral 09/2015 and 10/2015  . CHOLECYSTECTOMY    . COLONOSCOPY    . TOTAL KNEE ARTHROPLASTY Bilateral    both knees        Home Medications    Prior to Admission medications   Medication Sig Start Date End Date Taking? Authorizing Provider  albuterol (PROVENTIL HFA;VENTOLIN HFA) 108 (90 Base)  MCG/ACT inhaler Inhale 2 puffs into the lungs every 6 (six) hours as needed for wheezing or shortness of breath. 01/24/18   Colon Branch, MD  albuterol (PROVENTIL) (2.5 MG/3ML) 0.083% nebulizer solution Take 3 mLs (2.5 mg total) by nebulization every 6 (six) hours as needed for wheezing or shortness of breath. 01/24/18   Colon Branch, MD  asenapine (SAPHRIS) 5 MG SUBL Place 5 mg under the tongue at bedtime.     [provider]  augmented betamethasone dipropionate (DIPROLENE-AF) 0.05 % ointment Apply topically 2 (two) times daily as needed. 03/13/18   Colon Branch, MD  budesonide-formoterol [facility]) 160-4.5 MCG/ACT inhaler Inhale 2 puffs into the lungs 2 (two) times daily.     [provider]  clonazePAM (KLONOPIN) 0.5 MG tablet Take 0.5 mg by mouth 3 (three) times daily. 1 mg in the morning and 0.5 mg in the evening.    [provider]  clopidogrel (PLAVIX) 75 MG tablet Take 1 tablet (75 mg total) by mouth daily. 03/27/18   Colon Branch, MD  clotrimazole-betamethasone (LOTRISONE) cream Apply 1 application topically 2 (two) times daily. 05/23/17   Colon Branch, MD  DULoxetine (CYMBALTA) 60 MG capsule Take 60 mg by mouth every morning.     [provider]  EPIPEN 2-PAK 0.3 MG/0.3ML SOAJ injection Reported on 04/29/2016 07/04/14   [provider]  esomeprazole (NEXIUM) 40 MG capsule Take 40 mg by mouth daily.     [provider]  ferrous sulfate 325 (65 FE) MG tablet Take 1 tablet (325 mg total) by mouth 2 (two) times daily with a meal. 02/15/18   Colon Branch, MD  fexofenadine (ALLEGRA) 180 MG tablet Take 180 mg by mouth every morning.     [provider]  fluticasone (VERAMYST) 27.5 MCG/SPRAY nasal spray Place 2 sprays into the nose daily as needed.     [provider]  glucose blood (ONETOUCH VERIO) test strip Check blood sugar no more than twice daily 12/12/17   Colon Branch, MD  ipratropium (ATROVENT) 0.06 % nasal spray Place 1-2 sprays into both nostrils 4 (four) times daily as needed for rhinitis.     [provider]  lamoTRIgine (LAMICTAL) 25 MG tablet Take 50 mg by mouth at bedtime.    [provider]  Lancets [name] ULTRASOFT) lancets Check blood sugars no more than twice daily 12/09/16   Colon Branch, MD  losartan (COZAAR) 25 MG tablet Take 1 tablet (25 mg total) by mouth daily. 05/08/18   Colon Branch, MD  Multiple Vitamin (MULTIVITAMIN) tablet Take 1 tablet by mouth daily. Publishing copy, Historical, MD  [place] complex  Supplement daily over the counter    [provider]  pioglitazone-metformin (ACTOPLUS MET) 15-850 MG tablet Take 1 tablet by mouth 2 (two) times daily with a meal. 05/08/18   Colon Branch, MD  simvastatin (ZOCOR) 40 MG tablet Take 1 tablet (40 mg total) by mouth at bedtime. 04/17/18   Colon Branch, MD  traZODone (DESYREL) 50 MG tablet Take 150 mg by mouth at bedtime as needed for sleep.  07/09/14   Midge Minium, MD    Family History Family History  Problem Relation Age of Onset  . Diabetes Father   . Heart disease Brother   . Heart disease Maternal Grandmother   . Cancer Sister        unknown type  .  Colon cancer Neg Hx   . Prostate cancer Neg Hx   . Colon polyps Neg Hx   . Esophageal cancer Neg Hx   . Rectal cancer Neg Hx   . Stomach cancer Neg Hx     Social History Social History   Tobacco Use  . Smoking status: Former Smoker    Last attempt to quit: 11/15/1964    Years since quitting: 53.7  . Smokeless tobacco: Never Used  . Tobacco comment: for 1 year only   Substance Use Topics  . Alcohol use: Not Currently  . Drug use: No     Allergies   Smz-tmp ds [sulfamethoxazole-trimethoprim]; Divalproex sodium; Methocarbamol; Other; Paroxetine; Percocet [oxycodone-acetaminophen]; Risperidone; Wellbutrin [bupropion]; and Citalopram hydrobromide   Review of Systems Review of Systems  Constitutional: Negative for fever.  Respiratory: Positive for cough, shortness of breath and wheezing. Negative for hemoptysis.   Cardiovascular: Negative for leg swelling.  All other systems reviewed and are negative.    Physical Exam Updated Vital Signs BP (!) 142/76 (BP Location: Left Arm)   Pulse (!) 120   Temp 98 F (36.7 C) (Oral)   Resp (!) 26   Ht 1.753 m (5' 9" )   Wt 106.6 kg   SpO2 93%   BMI 34.70 kg/m   Physical Exam  CONSTITUTIONAL: Elderly, mild distress noted HEAD: Normocephalic/atraumatic EYES: EOMI/PERRL ENMT: Mucous membranes moist NECK: supple no  meningeal signs SPINE/BACK:entire spine nontender, kyphotic spine CV: S1/S2 noted, no murmurs/rubs/gallops noted LUNGS: Wheezing noted bilaterally, faint crackles noted, mild distress noted ABDOMEN: soft, nontender, no rebound or guarding, bowel sounds noted throughout abdomen NEURO: Pt is awake/alert/appropriate, moves all extremitiesx4.  No facial droop.   EXTREMITIES: pulses normal/equal, full ROM, no significant lower extremity edema SKIN: warm, color normal PSYCH: no abnormalities of mood noted, alert and oriented to situation  ED Treatments / Results  Labs (all labs ordered are listed, but only abnormal results are displayed) Labs Reviewed  CBC WITH DIFFERENTIAL/PLATELET - Abnormal; Notable for the following components:      Result Value   Hemoglobin 12.9 (*)    RDW 16.2 (*)    All other components within normal limits  BASIC METABOLIC PANEL    EKG EKG Interpretation  Date/Time:  Tuesday July 18 2018 06:10:09 EDT Ventricular Rate:  115 PR Interval:    QRS Duration: 100 QT Interval:  329 QTC Calculation: 455 R Axis:   162 Text Interpretation:  Sinus tachycardia Low voltage, precordial leads Probable RVH w/ secondary repol abnormality Confirmed by Ripley Fraise [phone]) on 07/18/2018 6:16:45 AM   Radiology No results found.  Procedures Procedures  CRITICAL CARE Performed by: Sharyon Cable Total critical care time: 33 minutes Critical care time was exclusive of separately billable procedures and treating other patients. Critical care was necessary to treat or prevent imminent or life-threatening deterioration. Critical care was time spent personally by me on the following activities: development of treatment plan with patient and/or surrogate as well as nursing, discussions with consultants, evaluation of patient's response to treatment, examination of patient, obtaining history from patient or surrogate, ordering and performing treatments and interventions,  ordering and review of laboratory studies, ordering and review of radiographic studies, pulse oximetry and re-evaluation of patient's condition. Patient with tachypnea, tachycardia and hypoxia requiring multiple nebulized treatments Medications Ordered in ED Medications  predniSONE (DELTASONE) tablet 60 mg (60 mg Oral Given 07/18/18 0613)  albuterol (PROVENTIL) (2.5 MG/3ML) 0.083% nebulizer solution 2.5 mg (2.5 mg Nebulization Given 07/18/18 0616)  ipratropium-albuterol (DUONEB) 0.5-2.5 (3) MG/3ML nebulizer solution 3 mL (3 mLs Nebulization Given 07/18/18 0616)  albuterol (PROVENTIL) (2.5 MG/3ML) 0.083% nebulizer solution 5 mg (5 mg Nebulization Given 07/18/18 0705)     Initial Impression / Assessment and Plan / ED Course  I have reviewed the triage vital signs and the nursing notes.  Pertinent imaging results that were available during my care of the patient were reviewed by me and considered in my medical decision making (see chart for details).    7:08 AM Pt presenting with wheezing, shortness of breath.  He is tachycardic and is mildly hypoxic.  He will require multiple nebulized therapies due to his asthma exacerbation.  He may also be developing pneumonia. Lower suspicion for PE at this time.  However if initial work-up is unrevealing he may need further testing Signed out to Dr Billy Fischer at sign out to f/u on imaging/labs I anticipate patient will need to be admitted  Final Clinical Impressions(s) / ED Diagnoses   Final diagnoses:  None    ED Discharge Orders    None       Ripley Fraise, MD 07/18/18 [phone]

## 2018-07-19 DIAGNOSIS — F3131 Bipolar disorder, current episode depressed, mild: Secondary | ICD-10-CM

## 2018-07-19 DIAGNOSIS — J181 Lobar pneumonia, unspecified organism: Principal | ICD-10-CM

## 2018-07-19 DIAGNOSIS — J441 Chronic obstructive pulmonary disease with (acute) exacerbation: Secondary | ICD-10-CM

## 2018-07-19 DIAGNOSIS — J4521 Mild intermittent asthma with (acute) exacerbation: Secondary | ICD-10-CM

## 2018-07-19 DIAGNOSIS — I10 Essential (primary) hypertension: Secondary | ICD-10-CM

## 2018-07-19 DIAGNOSIS — J9601 Acute respiratory failure with hypoxia: Secondary | ICD-10-CM

## 2018-07-19 DIAGNOSIS — E1142 Type 2 diabetes mellitus with diabetic polyneuropathy: Secondary | ICD-10-CM

## 2018-07-19 LAB — BASIC METABOLIC PANEL
Anion gap: 7 (ref 5–15)
BUN: 20 mg/dL (ref 8–23)
CO2: 25 mmol/L (ref 22–32)
Calcium: 8.5 mg/dL — ABNORMAL LOW (ref 8.9–10.3)
Chloride: 107 mmol/L (ref 98–111)
Creatinine, Ser: 0.94 mg/dL (ref 0.61–1.24)
GFR calc Af Amer: >60 mL/min (ref >60)
GFR calc non Af Amer: >60 mL/min (ref >60)
Glucose, Bld: 223 mg/dL — ABNORMAL HIGH (ref 70–99)
Potassium: 4.8 mmol/L (ref 3.5–5.1)
Sodium: 139 mmol/L (ref 135–145)

## 2018-07-19 LAB — CBC
HCT: 33.3 % — ABNORMAL LOW (ref 39.0–52.0)
Hemoglobin: 10.8 g/dL — ABNORMAL LOW (ref 13.0–17.0)
MCH: 27.8 pg (ref 26.0–34.0)
MCHC: 32.4 g/dL (ref 30.0–36.0)
MCV: 85.6 fL (ref 78.0–100.0)
Platelets: 186 10*3/uL (ref 150–400)
RBC: 3.89 MIL/uL — ABNORMAL LOW (ref 4.22–5.81)
RDW: 16.3 % — ABNORMAL HIGH (ref 11.5–15.5)
WBC: 10.7 10*3/uL — ABNORMAL HIGH (ref 4.0–10.5)

## 2018-07-19 LAB — BLOOD CULTURE ID PANEL (REFLEXED)

## 2018-07-19 LAB — GLUCOSE, CAPILLARY
Glucose-Capillary: 190 mg/dL — ABNORMAL HIGH (ref 70–99)
Glucose-Capillary: 221 mg/dL — ABNORMAL HIGH (ref 70–99)
Glucose-Capillary: 237 mg/dL — ABNORMAL HIGH (ref 70–99)
Glucose-Capillary: 248 mg/dL — ABNORMAL HIGH (ref 70–99)

## 2018-07-19 LAB — LACTIC ACID, PLASMA: Lactic Acid, Venous: 0.8 mmol/L (ref 0.5–1.9)

## 2018-07-19 LAB — STREP PNEUMONIAE URINARY ANTIGEN: Strep Pneumo Urinary Antigen: NEGATIVE

## 2018-07-19 LAB — HIV ANTIBODY (ROUTINE TESTING W REFLEX): HIV Screen 4th Generation wRfx: NONREACTIVE

## 2018-07-19 MED ORDER — AZITHROMYCIN 250 MG PO TABS
500.0000 mg | ORAL_TABLET | ORAL | Status: DC
  Administered 2018-07-20: 500 mg via ORAL
  Filled 2018-07-19: qty 2

## 2018-07-19 MED ORDER — METHYLPREDNISOLONE SODIUM SUCC 125 MG IJ SOLR
60.0000 mg | Freq: Two times a day (BID) | INTRAMUSCULAR | Status: DC
  Administered 2018-07-19 – 2018-07-20 (×2): 60 mg via INTRAVENOUS
  Filled 2018-07-19 (×2): qty 2

## 2018-07-19 NOTE — Progress Notes (Signed)
PHARMACIST - PHYSICIAN COMMUNICATION CONCERNING: Antibiotic IV to Oral Route Change Policy  RECOMMENDATION: This patient is receiving azithromycin by the intravenous route.  Based on criteria approved by the Pharmacy and Therapeutics Committee, the antibiotic(s) is/are being converted to the equivalent oral dose form(s).   DESCRIPTION: These criteria include:  Patient being treated for a respiratory tract infection, urinary tract infection, cellulitis or clostridium difficile associated diarrhea if on metronidazole  The patient is not neutropenic and does not exhibit a GI malabsorption state  The patient is eating (either orally or via tube) and/or has been taking other orally administered medications for a least 24 hours  The patient is improving clinically and has a Tmax < 100.5  If you have questions about this conversion, please contact the Pharmacy Department  []   [phone] )  [facility] []   [phone] )  [facility]  []   [phone] )  [facility] [x]   [phone] )  [facility]

## 2018-07-19 NOTE — Progress Notes (Signed)
PHARMACY - PHYSICIAN COMMUNICATION CRITICAL VALUE ALERT - BLOOD CULTURE IDENTIFICATION (BCID)  Ryan Zhang is an 72 y.o. male who presented to [facility] on 07/18/2018 with a chief complaint of SOB.  Assessment:  Suspect contaminant - no treatment needed  Name of physician (or Provider) Contacted: Dr. Sharolyn Douglas  Current antibiotics: azithromycin and ceftriaxone for CAP  Changes to prescribed antibiotics recommended:  Patient is on recommended antibiotics - No changes needed  Results for orders placed or performed during the hospital encounter of 07/18/18  Blood Culture ID Panel (Reflexed) (Collected: 07/18/2018  8:02 AM)  Result Value Ref Range   Enterococcus species NOT DETECTED NOT DETECTED   Listeria monocytogenes NOT DETECTED NOT DETECTED   Staphylococcus species DETECTED (A) NOT DETECTED   Staphylococcus aureus NOT DETECTED NOT DETECTED   Methicillin resistance NOT DETECTED NOT DETECTED   Streptococcus species NOT DETECTED NOT DETECTED   Streptococcus agalactiae NOT DETECTED NOT DETECTED   Streptococcus pneumoniae NOT DETECTED NOT DETECTED   Streptococcus pyogenes NOT DETECTED NOT DETECTED   Acinetobacter baumannii NOT DETECTED NOT DETECTED   Enterobacteriaceae species NOT DETECTED NOT DETECTED   Enterobacter cloacae complex NOT DETECTED NOT DETECTED   Escherichia coli NOT DETECTED NOT DETECTED   Klebsiella oxytoca NOT DETECTED NOT DETECTED   Klebsiella pneumoniae NOT DETECTED NOT DETECTED   Proteus species NOT DETECTED NOT DETECTED   Serratia marcescens NOT DETECTED NOT DETECTED   Haemophilus influenzae NOT DETECTED NOT DETECTED   Neisseria meningitidis NOT DETECTED NOT DETECTED   Pseudomonas aeruginosa NOT DETECTED NOT DETECTED   Candida albicans NOT DETECTED NOT DETECTED   Candida glabrata NOT DETECTED NOT DETECTED   Candida krusei NOT DETECTED NOT DETECTED   Candida parapsilosis NOT DETECTED NOT DETECTED   Candida tropicalis NOT DETECTED NOT DETECTED    Ivery Quale 07/19/2018  10:48 AM

## 2018-07-19 NOTE — Evaluation (Signed)
Physical Therapy Evaluation-1x Patient Details Name: Ryan Zhang MRN: [id number] DOB: 08-12-1946 Today's Date: 07/19/2018   History of Present Illness  72 yo male admitted with acute respiratory failure, Pna, asthma exac. Hx of bipolar d/o, depression, DM, CVA, bil knee replacements  Clinical Impression  On eval, pt was Sup-Mod I for mobility. He walked ~175 feet. Slight unsteadiness intermittently but no LOB. O2 sat 92-94% on RA during session. Do not anticipate any further PT needs. Recommend daily ambulation in hallway with  nursing supervision. 1x eval. Will sign off.     Follow Up Recommendations No PT follow up    Equipment Recommendations  None recommended by PT    Recommendations for Other Services       Precautions / Restrictions Precautions Precautions: Fall Precaution Comments: monitor O2  Restrictions Weight Bearing Restrictions: No      Mobility  Bed Mobility Overal bed mobility: Modified Independent                Transfers Overall transfer level: Modified independent                  Ambulation/Gait Ambulation/Gait assistance: Supervision Gait Distance (Feet): 175 Feet Assistive device: None Gait Pattern/deviations: Step-through pattern;Decreased stride length     General Gait Details: slow gait speed. slight unsteadiness intermittently. No LOB.   Stairs            Wheelchair Mobility    Modified Rankin (Stroke Patients Only)       Balance Overall balance assessment: Mild deficits observed, not formally tested                                           Pertinent Vitals/Pain Pain Assessment: No/denies pain    Home Living Family/patient expects to be discharged to:: Private residence Living Arrangements: Spouse/significant other   Type of Home: House       Home Layout: Bed/bath upstairs Home Equipment: Environmental consultant - 2 wheels;Cane - single point      Prior Function Level of Independence: Independent                Hand Dominance        Extremity/Trunk Assessment   Upper Extremity Assessment Upper Extremity Assessment: Overall WFL for tasks assessed    Lower Extremity Assessment Lower Extremity Assessment: Generalized weakness    Cervical / Trunk Assessment Cervical / Trunk Assessment: Normal  Communication   Communication: No difficulties  Cognition Arousal/Alertness: Awake/Zhang Behavior During Therapy: WFL for tasks assessed/performed Overall Cognitive Status: Within Functional Limits for tasks assessed                                        General Comments      Exercises     Assessment/Plan    PT Assessment Patent does not need any further PT services  PT Problem List         PT Treatment Interventions      PT Goals (Current goals can be found in the Care Plan section)  Acute Rehab PT Goals Patient Stated Goal: home soon PT Goal Formulation: All assessment and education complete, DC therapy    Frequency     Barriers to discharge        Co-evaluation  AM-PAC PT "6 Clicks" Daily Activity  Outcome Measure Difficulty turning over in bed (including adjusting bedclothes, sheets and blankets)?: A Little Difficulty moving from lying on back to sitting on the side of the bed? : A Little Difficulty sitting down on and standing up from a chair with arms (e.g., wheelchair, bedside commode, etc,.)?: A Little Help needed moving to and from a bed to chair (including a wheelchair)?: A Little Help needed walking in hospital room?: A Little Help needed climbing 3-5 steps with a railing? : A Little 6 Click Score: 18    End of Session   Activity Tolerance: Patient tolerated treatment well Patient left: in bed;with call bell/phone within reach        Time: 1435-1445 PT Time Calculation (min) (ACUTE ONLY): 10 min   Charges:   PT Evaluation $PT Eval Moderate Complexity: 1 Mod            Ryan Zhang,  MPT Pager: [phone]

## 2018-07-19 NOTE — Progress Notes (Signed)
PROGRESS NOTE  Ryan Zhang AVW:[id number] DOB: [birth year] DOA: 07/18/2018 PCP: Wanda Plump, MD  HPI/Recap of past 24 hours: Patient is a 72 year old male with history of moderate persistent asthma, bipolar disorder, depression, diabetes, GERD, hypertension, hyperlipidemia, history of stroke presented to ED with acute shortness of breath with wheezing and dry cough X 1 day. Patient also had left-sided chest heaviness with wheezing, worse on deep breathing, 6/10, with no radiation, no longer present in ED. Pt denied any recent sick contacts or antibiotic use.  No recent long distance car rides or air flights or leg swelling. Pt admitted for further management.  Today, pt reported feeling better, although still SOB with cough. Pt denies any chest pain, abdominal pain, fever/chills, N/V/D.   Assessment/Plan: Principal Problem:   Acute respiratory failure (HCC) Active Problems:   DM type 2 with diabetic peripheral neuropathy (HCC)   Bipolar disorder (HCC)   Essential hypertension, benign   Asthma exacerbation   GERD   CAP (community acquired pneumonia)   Acute respiratory failure with hypoxia (HCC)  Acute hypoxic respiratory failure 2/2 CAP Afebrile, with leukocytosis (on steroids) BC X 2 NGTD, initial BC in [facility] showed gram + cocci in clusters likely contaminant Urine strep antigen negative, legionella pending LA resolved 2.3-->0.8 D-dimer elevated, CTA chest showed: Evaluation for pulmonary emboli in the lung bases is limited due to respiratory motion. No central emboli identified. Left lower lobe infiltrate most likely pneumonia Continue IV rocephin, zithromax Continue solu-medrol 60 mg Q8H, nebs, pulmicort, brovana, flutter valve Continue O2 supplementation prn  DM type 2 with peripheral neuropathy Last A1c 6.4 SSI, accuchecks, hypoglycemic protocol Hold oral hypoglycemics  Bipolar disorder Continue cymbalta, klonopin  Essential hypertension Stable Continue  losartan  GERD Continue Protonix  History of prior CVA Continue Plavix    Code Status: Full  Family Communication: None at bedside  Disposition Plan: Likely home in a few days   Consultants:  None  Procedures:  None  Antimicrobials:  Rocephin  Azithromycin  DVT prophylaxis:  Heparin   Objective: Vitals:   07/19/18 0459 07/19/18 0837 07/19/18 0839 07/19/18 1347  BP: 111/63   122/68  Pulse: 69   79  Resp:    16  Temp: 98.6 F (37 C)   98 F (36.7 C)  TempSrc: Oral   Oral  SpO2: 98% 94% 94% 96%  Weight:      Height:        Intake/Output Summary (Last 24 hours) at 07/19/2018 1424 Last data filed at 07/19/2018 1300 Gross per 24 hour  Intake 2026.12 ml  Output -  Net 2026.12 ml   Filed Weights   07/18/18 0605  Weight: 106.6 kg    Exam:   General: NAD   Cardiovascular: S1, S2 present  Respiratory: Diminished BS bilaterally   Abdomen: Soft, NT, ND, BS present  Musculoskeletal: No pedal edema bilaterally  Skin: Normal  Psychiatry: Normal mood   Data Reviewed: CBC: Recent Labs  Lab 07/18/18 0619 07/18/18 1316 07/19/18 0420  WBC 10.5 12.2* 10.7*  NEUTROABS 7.7  --   --   HGB 12.9* 11.7* 10.8*  HCT 39.4 36.2* 33.3*  MCV 85.1 85.8 85.6  PLT 239 198 186   Basic Metabolic Panel: Recent Labs  Lab 07/18/18 0619 07/18/18 1316 07/19/18 0420  NA 139  --  139  K 4.2  --  4.8  CL 103  --  107  CO2 24  --  25  GLUCOSE 275*  --  223*  BUN 26*  --  20  CREATININE 0.91 1.01 0.94  CALCIUM 8.7*  --  8.5*   GFR: Estimated Creatinine Clearance: 88 mL/min (by C-G formula based on SCr of 0.94 mg/dL). Liver Function Tests: No results for input(s): AST, ALT, ALKPHOS, BILITOT, PROT, ALBUMIN in the last 168 hours. No results for input(s): LIPASE, AMYLASE in the last 168 hours. No results for input(s): AMMONIA in the last 168 hours. Coagulation Profile: No results for input(s): INR, PROTIME in the last 168 hours. Cardiac Enzymes: Recent  Labs  Lab 07/18/18 0802  TROPONINI <0.03   BNP (last 3 results) No results for input(s): PROBNP in the last 8760 hours. HbA1C: Recent Labs    07/18/18 1316  HGBA1C 6.4*   CBG: Recent Labs  Lab 07/18/18 1305 07/18/18 1623 07/18/18 2126 07/19/18 0735 07/19/18 1203  GLUCAP 288* 319* 180* 190* 221*   Lipid Profile: No results for input(s): CHOL, HDL, LDLCALC, TRIG, CHOLHDL, LDLDIRECT in the last 72 hours. Thyroid Function Tests: No results for input(s): TSH, T4TOTAL, FREET4, T3FREE, THYROIDAB in the last 72 hours. Anemia Panel: No results for input(s): VITAMINB12, FOLATE, FERRITIN, TIBC, IRON, RETICCTPCT in the last 72 hours. Urine analysis:    Component Value Date/Time   COLORURINE YELLOW 05/09/2017 1109   APPEARANCEUR CLEAR 05/09/2017 1109   LABSPEC 1.020 05/09/2017 1109   PHURINE 5.5 05/09/2017 1109   GLUCOSEU NEGATIVE 05/09/2017 1109   HGBUR NEGATIVE 05/09/2017 1109   BILIRUBINUR NEGATIVE 05/09/2017 1109   KETONESUR NEGATIVE 05/09/2017 1109   PROTEINUR NEGATIVE 06/02/2010 0817   UROBILINOGEN 0.2 05/09/2017 1109   NITRITE NEGATIVE 05/09/2017 1109   LEUKOCYTESUR MODERATE (A) 05/09/2017 1109   Sepsis Labs: @LABRCNTIP (procalcitonin:4,lacticidven:4)  ) Recent Results (from the past 240 hour(s))  Blood culture (routine x 2)     Status: None (Preliminary result)   Collection Time: 07/18/18  8:02 AM  Result Value Ref Range Status   Specimen Description   Final    BLOOD LEFT ANTECUBITAL Performed at [facility], [address] [place], [place] [address]    Special Requests   Final    BOTTLES DRAWN AEROBIC AND ANAEROBIC Blood Culture adequate volume Performed at [facility], [address] [place], [place] [address]    Culture  Setup Time   Final    GRAM POSITIVE COCCI IN CLUSTERS IN BOTH AEROBIC AND ANAEROBIC BOTTLES CRITICAL RESULT CALLED TO, READ BACK BY AND VERIFIED WITH: Fenton Foy PharmD 9:40 07/19/18 ([email or web address]) Performed at [facility], [address], [place], [place] [address]    Culture GRAM POSITIVE COCCI  Final   Report Status PENDING  Incomplete  Blood Culture ID Panel (Reflexed)     Status: Abnormal   Collection Time: 07/18/18  8:02 AM  Result Value Ref Range Status   Enterococcus species NOT DETECTED NOT DETECTED Final   Listeria monocytogenes NOT DETECTED NOT DETECTED Final   Staphylococcus species DETECTED (A) NOT DETECTED Final    Comment: Methicillin (oxacillin) susceptible coagulase negative staphylococcus. Possible blood culture contaminant (unless isolated from more than one blood culture draw or clinical case suggests pathogenicity). No antibiotic treatment is indicated for blood  culture contaminants. CRITICAL RESULT CALLED TO, READ BACK BY AND VERIFIED WITH: Fenton Foy PharmD 9:40 07/19/18 ([email or web address])    Staphylococcus aureus NOT DETECTED NOT DETECTED Final   Methicillin resistance NOT DETECTED NOT DETECTED Final   Streptococcus species NOT DETECTED NOT DETECTED Final   Streptococcus agalactiae NOT DETECTED NOT DETECTED  Final   Streptococcus pneumoniae NOT DETECTED NOT DETECTED Final   Streptococcus pyogenes NOT DETECTED NOT DETECTED Final   Acinetobacter baumannii NOT DETECTED NOT DETECTED Final   Enterobacteriaceae species NOT DETECTED NOT DETECTED Final   Enterobacter cloacae complex NOT DETECTED NOT DETECTED Final   Escherichia coli NOT DETECTED NOT DETECTED Final   Klebsiella oxytoca NOT DETECTED NOT DETECTED Final   Klebsiella pneumoniae NOT DETECTED NOT DETECTED Final   Proteus species NOT DETECTED NOT DETECTED Final   Serratia marcescens NOT DETECTED NOT DETECTED Final   Haemophilus influenzae NOT DETECTED NOT DETECTED Final   Neisseria meningitidis NOT DETECTED NOT DETECTED Final   Pseudomonas aeruginosa NOT DETECTED NOT DETECTED Final   Candida albicans NOT DETECTED NOT DETECTED Final   Candida glabrata NOT DETECTED NOT DETECTED Final   Candida krusei NOT DETECTED NOT DETECTED Final    Candida parapsilosis NOT DETECTED NOT DETECTED Final   Candida tropicalis NOT DETECTED NOT DETECTED Final    Comment: Performed at [facility], [address], [place], [place] [address]  Culture, blood (routine x 2) Call MD if unable to obtain prior to antibiotics being given     Status: None (Preliminary result)   Collection Time: 07/18/18  1:16 PM  Result Value Ref Range Status   Specimen Description   Final    BLOOD RIGHT ANTECUBITAL Performed at [facility], [address] [place] [address] [place], [place] [address]    Special Requests   Final    BOTTLES DRAWN AEROBIC AND ANAEROBIC Blood Culture adequate volume Performed at [facility], [address], [place], [place] [address]    Culture   Final    NO GROWTH < 24 HOURS Performed at [facility], [address] [place], [place] [address]    Report Status PENDING  Incomplete  Culture, blood (routine x 2) Call MD if unable to obtain prior to antibiotics being given     Status: None (Preliminary result)   Collection Time: 07/18/18  1:22 PM  Result Value Ref Range Status   Specimen Description   Final    BLOOD RIGHT HAND Performed at [facility], [address], [place], [place] [address]    Special Requests   Final    BOTTLES DRAWN AEROBIC ONLY Blood Culture results may not be optimal due to an inadequate volume of blood received in culture bottles Performed at [facility], [address], [place], [place] [address]    Culture   Final    NO GROWTH < 24 HOURS Performed at [facility], [address], [place], [place] [address]    Report Status PENDING  Incomplete      Studies: Ct Angio Chest Pe W Or Wo Contrast  Result Date: 07/18/2018 CLINICAL DATA:  Acute shortness of breath. Wheezing. Cough. No fever or chills. EXAM: CT ANGIOGRAPHY CHEST WITH CONTRAST TECHNIQUE: Multidetector CT imaging of the chest was performed using the standard  protocol during bolus administration of intravenous contrast. Multiplanar CT image reconstructions and MIPs were obtained to evaluate the vascular anatomy. CONTRAST:  ISOVUE-370 IOPAMIDOL (ISOVUE-370) INJECTION 76% COMPARISON:  Chest x-ray July 18, 2018. Chest CT August 19, 2016. FINDINGS: Cardiovascular: Cardiomegaly. Coronary artery calcifications. The thoracic aorta demonstrates atherosclerotic change without aneurysm or dissection. Evaluation of the pulmonary arteries in the lung bases is limited due to respiratory motion. No definite pulmonary emboli identified. Mediastinum/Nodes: The thyroid and esophagus are unremarkable. There is a tiny  left pleural effusion which is new. No right pleural effusion or pericardial effusion noted. A prevascular node on series 6, image 29 measures 10.5 mm in short axis versus 10 mm in October 2017, probably reactive. A few other shotty nodes are probably reactive. Lungs/Pleura: Central airways are normal. No pneumothorax. There is an infiltrate centered in the left lower lobe. Mild infiltrate seen in the lingula posteriorly as well. There is volume loss in the left lower lobe. Mild opacity in the medial right lung base is probably atelectasis. No other infiltrate identified. A nodule in the right upper lobe on series 8, image 50 measures 5 x 5 mm today versus 6 by 4 mm in October of 2017, 5 x 6 mm in February of 2017, and 6 by 4.5 mm in September of 2015. The slight differences in size may be due to difference in slice selection. A nodule in the right lung on series 8, image 62 measures 9 by 6 mm today versus 8 x 6 mm in September 2015 without significant change. No other nodules or masses. Upper Abdomen: Previous cholecystectomy. No acute abnormalities noted. Musculoskeletal: No chest wall abnormality. No acute or significant osseous findings. Review of the MIP images confirms the above findings. IMPRESSION: 1. Evaluation for pulmonary emboli in the lung bases is  limited due to respiratory motion. No central emboli identified. 2. Left lower lobe infiltrate most likely pneumonia. Recommend short-term follow-up after treatment to ensure resolution. 3. Two nodules in the right lungs. Given difference in slice selection, no significant change since 2015. Recommend attention on follow-up. 4. Mildly prominent mediastinal nodes are likely reactive. Recommend attention on follow-up. 5. Tiny left pleural effusion, likely reactive. 6. Atherosclerotic changes in the thoracic aorta. Coronary artery calcifications. Aortic Atherosclerosis (ICD10-I70.0). Electronically Signed   By: Gerome Sam III M.D   On: 07/18/2018 17:10    Scheduled Meds: . arformoterol  15 mcg Nebulization BID  . asenapine  5 mg Sublingual QHS  . [START ON 07/20/2018] azithromycin  500 mg Oral Q24H  . budesonide (PULMICORT) nebulizer solution  0.25 mg Nebulization BID  . clonazePAM  0.5 mg Oral QPM  . clonazePAM  1 mg Oral Daily  . clopidogrel  75 mg Oral Daily  . DULoxetine  60 mg Oral Daily  . ferrous sulfate  325 mg Oral BID WC  . fluticasone  2 spray Each Nare Daily  . heparin  5,000 Units Subcutaneous Q8H  . insulin aspart  0-15 Units Subcutaneous TID WC  . insulin aspart  0-5 Units Subcutaneous QHS  . levalbuterol  0.63 mg Nebulization TID   And  . ipratropium  0.5 mg Nebulization TID  . loratadine  10 mg Oral Daily  . losartan  25 mg Oral Daily  . methylPREDNISolone (SOLU-MEDROL) injection  60 mg Intravenous Q8H  . multivitamin with minerals  1 tablet Oral Daily  . pantoprazole  40 mg Oral Daily  . simvastatin  40 mg Oral QHS    Continuous Infusions: . cefTRIAXone (ROCEPHIN)  IV 1 g (07/19/18 5366)     LOS: 1 day     Briant Cedar, MD [facility]   If 7PM-7AM, please contact night-coverage [email or web address] Password [email or web address] 07/19/2018, 2:24 PM

## 2018-07-20 DIAGNOSIS — K219 Gastro-esophageal reflux disease without esophagitis: Secondary | ICD-10-CM

## 2018-07-20 LAB — GLUCOSE, CAPILLARY
Glucose-Capillary: 246 mg/dL — ABNORMAL HIGH (ref 70–99)
Glucose-Capillary: 200 mg/dL — ABNORMAL HIGH (ref 70–99)

## 2018-07-20 LAB — BASIC METABOLIC PANEL
Anion gap: 9 (ref 5–15)
BUN: 31 mg/dL — ABNORMAL HIGH (ref 8–23)
CO2: 24 mmol/L (ref 22–32)
Calcium: 8.7 mg/dL — ABNORMAL LOW (ref 8.9–10.3)
Chloride: 105 mmol/L (ref 98–111)
Creatinine, Ser: 0.87 mg/dL (ref 0.61–1.24)
GFR calc Af Amer: >60 mL/min (ref >60)
GFR calc non Af Amer: >60 mL/min (ref >60)
Glucose, Bld: 302 mg/dL — ABNORMAL HIGH (ref 70–99)
Potassium: 3.9 mmol/L (ref 3.5–5.1)
Sodium: 138 mmol/L (ref 135–145)

## 2018-07-20 LAB — LEGIONELLA PNEUMOPHILA SEROGP 1 UR AG: L. pneumophila Serogp 1 Ur Ag: NEGATIVE

## 2018-07-20 LAB — CBC WITH DIFFERENTIAL/PLATELET
Basophils Absolute: 0 10*3/uL (ref 0.0–0.1)
Basophils Relative: 0 %
Eosinophils Absolute: 0 10*3/uL (ref 0.0–0.7)
Eosinophils Relative: 0 %
HCT: 32.1 % — ABNORMAL LOW (ref 39.0–52.0)
Hemoglobin: 10.4 g/dL — ABNORMAL LOW (ref 13.0–17.0)
Lymphocytes Relative: 14 %
Lymphs Abs: 1.5 10*3/uL (ref 0.7–4.0)
MCH: 27.7 pg (ref 26.0–34.0)
MCHC: 32.4 g/dL (ref 30.0–36.0)
MCV: 85.4 fL (ref 78.0–100.0)
Monocytes Absolute: 0.6 10*3/uL (ref 0.1–1.0)
Monocytes Relative: 5 %
Neutro Abs: 9 10*3/uL — ABNORMAL HIGH (ref 1.7–7.7)
Neutrophils Relative %: 81 %
Platelets: 215 10*3/uL (ref 150–400)
RBC: 3.76 MIL/uL — ABNORMAL LOW (ref 4.22–5.81)
RDW: 16.1 % — ABNORMAL HIGH (ref 11.5–15.5)
WBC: 11.1 10*3/uL — ABNORMAL HIGH (ref 4.0–10.5)

## 2018-07-20 MED ORDER — AMOXICILLIN-POT CLAVULANATE 875-125 MG PO TABS: 1.0000 | ORAL_TABLET | Freq: Two times a day (BID) | ORAL | 0 refills | Status: AC

## 2018-07-20 MED ORDER — AMOXICILLIN-POT CLAVULANATE 875-125 MG PO TABS: 1.0000 | ORAL_TABLET | Freq: Two times a day (BID) | ORAL | Status: DC

## 2018-07-20 MED ORDER — PREDNISONE 10 MG PO TABS: ORAL_TABLET | ORAL | 0 refills | Status: DC

## 2018-07-20 MED ORDER — AZITHROMYCIN 500 MG PO TABS: 500.0000 mg | ORAL_TABLET | Freq: Every day | ORAL | 0 refills | Status: AC

## 2018-07-20 NOTE — Discharge Planning (Deleted)
Discharge Summary  Ryan Zhang WLN:[id number] DOB: 01-Oct-1946  PCP: Colon Branch, MD  Admit date: 07/18/2018 Discharge date: 07/20/2018  Time spent: 40 mins   Recommendations for Outpatient Follow-up:  1. PCP   Discharge Diagnoses:  Active Hospital Problems   Diagnosis Date Noted  . Acute respiratory failure ([place]) 07/18/2018  . CAP (community acquired pneumonia) 07/18/2018  . Acute respiratory failure with hypoxia ([place]) 07/18/2018  . Essential hypertension, benign 01/16/2010  . Asthma exacerbation 11/26/2009  . GERD 10/14/2009  . Bipolar disorder ([place]) 02/20/2009  . DM type 2 with diabetic peripheral neuropathy ([place]) 02/22/2007    Resolved Hospital Problems   Diagnosis Date Noted Date Resolved  . COPD with acute exacerbation ([place]) 07/18/2018 07/18/2018    Discharge Condition: Stable  Diet recommendation: Heart healthy/mod carb  Vitals:   07/20/18 0802 07/20/18 1000  BP:  126/65  Pulse:  75  Resp:  17  Temp:  97.8 F (36.6 C)  SpO2: 94%     History of present illness:  Patient is a 72 year old male with history of moderate persistent asthma, bipolar disorder, depression, diabetes, GERD, hypertension, hyperlipidemia, history of stroke presented to ED with acute shortness of breath with wheezing and dry cough X 1 day. Patient also had left-sided chest heaviness with wheezing, worse on deep breathing, 6/10, with no radiation, no longer present in ED. Pt denied any recent sick contacts or antibiotic use. No recent long distance car rides or air flights or leg swelling. Pt admitted for further management.  Today, pt reported feeling much better. Pt denies any chest pain, abdominal pain, fever/chills, N/V/D. Stable for discharge  Hospital Course:  Principal Problem:   Acute respiratory failure ([place]) Active Problems:   DM type 2 with diabetic peripheral neuropathy ([facility])   Bipolar disorder ([place])   Essential hypertension, benign   Asthma exacerbation   GERD   CAP  (community acquired pneumonia)   Acute respiratory failure with hypoxia ([place])  Acute hypoxic respiratory failure 2/2 CAP Afebrile, with mild leukocytosis (on steroids) BC X 2 NGTD, initial BC in [facility] showed gram + cocci in clusters likely contaminant Urine strep antigen negative, legionella pending LA resolved 2.3-->0.8 D-dimer elevated, CTA chest showed: Evaluation for pulmonary emboli in the lung bases is limited due to respiratory motion. No central emboli identified. Left lower lobe infiltrate most likely pneumonia S/P IV rocephin, zithromax, will d/c pt on PO Augmentin for a total of 7 days of AB and PO Azithromycin for a total of 5 days. S/P solu-medrol, will d/c on a tapered dose of PO prednisone Continue home nebs, inhaler Follow up with PCP in 1 week  DM type 2 with peripheral neuropathy Last A1c 6.4 Continue home oral hypoglycemics, pt advised to closely monitor his CBGs as he is currently on steroids  Bipolar disorder Continue cymbalta, klonopin  Essential hypertension Stable Continue losartan  GERD Continue Protonix  History of prior CVA Continue Plavix   Procedures:  None   Consultations:  None  Discharge Exam: BP 126/65 (BP Location: Right Arm)   Pulse 75   Temp 97.8 F (36.6 C) (Oral)   Resp 17   Ht 5' 9" (1.753 m)   Wt 106.6 kg   SpO2 94%   BMI 34.70 kg/m   General: NAD Cardiovascular: S1, S2 present Respiratory: Diminished BS bilaterally. No wheezing noted  Discharge Instructions You were cared for by a hospitalist during your hospital stay. If you have any questions about your discharge medications or the care  you received while you were in the hospital after you are discharged, you can call the unit and asked to speak with the hospitalist on call if the hospitalist that took care of you is not available. Once you are discharged, your primary care physician will handle any further medical issues. Please note that NO REFILLS for any  discharge medications will be authorized once you are discharged, as it is imperative that you return to your primary care physician (or establish a relationship with a primary care physician if you do not have one) for your aftercare needs so that they can reassess your need for medications and monitor your lab values.   Allergies as of 07/20/2018      Reactions   Smz-tmp Ds [sulfamethoxazole-trimethoprim] Nausea And Vomiting   Divalproex Sodium Other (See Comments)   Pancreatitis    Methocarbamol Other (See Comments)   hallucinations   Other    "Symbrax" alteration in blood sugar, pt unsure if low or high blood sugar   Paroxetine Other (See Comments)   Insomnia   Percocet [oxycodone-acetaminophen] Other (See Comments)   Hyperactivity   Risperidone Other (See Comments)   REACTION: hyper   Wellbutrin [bupropion] Other (See Comments)   Hot flashes   Citalopram Hydrobromide Other (See Comments)   Insomnia      Medication List    TAKE these medications   albuterol 108 (90 Base) MCG/ACT inhaler Commonly known as:  PROVENTIL HFA;VENTOLIN HFA Inhale 2 puffs into the lungs every 6 (six) hours as needed for wheezing or shortness of breath.   albuterol (2.5 MG/3ML) 0.083% nebulizer solution Commonly known as:  PROVENTIL Take 3 mLs (2.5 mg total) by nebulization every 6 (six) hours as needed for wheezing or shortness of breath.   amoxicillin-clavulanate 875-125 MG tablet Commonly known as:  AUGMENTIN Take 1 tablet by mouth every 12 (twelve) hours for 4 days. Start taking on:  07/21/2018   augmented betamethasone dipropionate 0.05 % ointment Commonly known as:  DIPROLENE-AF Apply topically 2 (two) times daily as needed. What changed:    how much to take  reasons to take this   azithromycin 500 MG tablet Commonly known as:  ZITHROMAX Take 1 tablet (500 mg total) by mouth daily for 2 days. Start taking on:  07/21/2018   budesonide-formoterol 160-4.5 MCG/ACT inhaler Commonly known  as:  SYMBICORT Inhale 2 puffs into the lungs 2 (two) times daily.   clonazePAM 0.5 MG tablet Commonly known as:  KLONOPIN Take 0.5 mg by mouth 2 (two) times daily. 1 mg in the morning and 0.5 mg in the evening.   clopidogrel 75 MG tablet Commonly known as:  PLAVIX Take 1 tablet (75 mg total) by mouth daily. What changed:  when to take this   clotrimazole-betamethasone cream Commonly known as:  LOTRISONE Apply 1 application topically 2 (two) times daily.   COMPLETE PROSTATE HEALTH Tabs Take 1 tablet by mouth every morning.   CYMBALTA 60 MG capsule Generic drug:  DULoxetine Take 60 mg by mouth every morning.   EPIPEN 2-PAK 0.3 mg/0.3 mL Soaj injection Generic drug:  EPINEPHrine Reported on 04/29/2016   esomeprazole 40 MG capsule Commonly known as:  NEXIUM Take 80 mg by mouth every morning.   ferrous sulfate 325 (65 FE) MG tablet Take 1 tablet (325 mg total) by mouth 2 (two) times daily with a meal.   fexofenadine 180 MG tablet Commonly known as:  ALLEGRA Take 180 mg by mouth every morning.   glucose blood test  strip Check blood sugar no more than twice daily   lamoTRIgine 25 MG tablet Commonly known as:  LAMICTAL Take 50 mg by mouth at bedtime.   losartan 25 MG tablet Commonly known as:  COZAAR Take 1 tablet (25 mg total) by mouth daily. What changed:  when to take this   multivitamin tablet Take 1 tablet by mouth daily.   onetouch ultrasoft lancets Check blood sugars no more than twice daily   pioglitazone-metformin 15-850 MG tablet Commonly known as:  ACTOPLUS MET Take 1 tablet by mouth 2 (two) times daily with a meal.   predniSONE 10 MG tablet Commonly known as:  DELTASONE Take 4 tabs daily for 2 days, then 3 tabs daily for 2 days, then 2 tabs daily for 2 days and 1 tab daily for 2 days and finish   SAPHRIS 5 MG Subl 24 hr tablet Generic drug:  asenapine Place 5 mg under the tongue at bedtime.   simvastatin 40 MG tablet Commonly known as:   ZOCOR Take 1 tablet (40 mg total) by mouth at bedtime.   SUPREP BOWEL PREP KIT 17.5-3.13-1.6 GM/177ML Soln Generic drug:  Na Sulfate-K Sulfate-Mg Sulf Take 1 Bottle by mouth as directed.   traZODone 50 MG tablet Commonly known as:  DESYREL Take 50 mg by mouth at bedtime as needed for sleep.   VERAMYST 27.5 MCG/SPRAY nasal spray Generic drug:  fluticasone Place 2 sprays into the nose daily as needed for rhinitis or allergies.      Allergies  Allergen Reactions  . Smz-Tmp Ds [Sulfamethoxazole-Trimethoprim] Nausea And Vomiting  . Divalproex Sodium Other (See Comments)    Pancreatitis   . Methocarbamol Other (See Comments)    hallucinations  . Other     "Symbrax" alteration in blood sugar, pt unsure if low or high blood sugar  . Paroxetine Other (See Comments)    Insomnia  . Percocet [Oxycodone-Acetaminophen] Other (See Comments)    Hyperactivity  . Risperidone Other (See Comments)    REACTION: hyper  . Wellbutrin [Bupropion] Other (See Comments)    Hot flashes  . Citalopram Hydrobromide Other (See Comments)    Insomnia   Follow-up Information    Colon Branch, MD. Schedule an appointment as soon as possible for a visit in 1 week(s).   Specialty:  Internal Medicine Contact information: [address] [place] [address] [phone]            The results of significant diagnostics from this hospitalization (including imaging, microbiology, ancillary and laboratory) are listed below for reference.    Significant Diagnostic Studies: Dg Chest 2 View  Result Date: 07/18/2018 CLINICAL DATA:  Increased shortness of breath, COPD, asthma, diabetes mellitus, hypertension, former smoker EXAM: CHEST - 2 VIEW COMPARISON:  01/24/2018 FINDINGS: Minimal enlargement cardiac silhouette. Mediastinal contours and pulmonary vascularity normal. Bronchitic changes with mild RIGHT basilar atelectasis. Increased LEFT lower lobe opacity question atelectasis versus  consolidation. Upper lungs clear. No pleural effusion or pneumothorax. Bones demineralized. Surgical clips RIGHT upper quadrant. IMPRESSION: LEFT lower lobe opacity question atelectasis versus pneumonia. Electronically Signed   By: Lavonia Dana M.D.   On: 07/18/2018 07:08   Ct Angio Chest Pe W Or Wo Contrast  Result Date: 07/18/2018 CLINICAL DATA:  Acute shortness of breath. Wheezing. Cough. No fever or chills. EXAM: CT ANGIOGRAPHY CHEST WITH CONTRAST TECHNIQUE: Multidetector CT imaging of the chest was performed using the standard protocol during bolus administration of intravenous contrast. Multiplanar CT image reconstructions and  MIPs were obtained to evaluate the vascular anatomy. CONTRAST:  142m ISOVUE-370 IOPAMIDOL (ISOVUE-370) INJECTION 76% COMPARISON:  Chest x-ray July 18, 2018. Chest CT August 19, 2016. FINDINGS: Cardiovascular: Cardiomegaly. Coronary artery calcifications. The thoracic aorta demonstrates atherosclerotic change without aneurysm or dissection. Evaluation of the pulmonary arteries in the lung bases is limited due to respiratory motion. No definite pulmonary emboli identified. Mediastinum/Nodes: The thyroid and esophagus are unremarkable. There is a tiny left pleural effusion which is new. No right pleural effusion or pericardial effusion noted. A prevascular node on series 6, image 29 measures 10.5 mm in short axis versus 10 mm in October 2017, probably reactive. A few other shotty nodes are probably reactive. Lungs/Pleura: Central airways are normal. No pneumothorax. There is an infiltrate centered in the left lower lobe. Mild infiltrate seen in the lingula posteriorly as well. There is volume loss in the left lower lobe. Mild opacity in the medial right lung base is probably atelectasis. No other infiltrate identified. A nodule in the right upper lobe on series 8, image 50 measures 5 x 5 mm today versus 6 by 4 mm in October of 2017, 5 x 6 mm in February of 2017, and 6 by 4.5 mm in  September of 2015. The slight differences in size may be due to difference in slice selection. A nodule in the right lung on series 8, image 62 measures 9 by 6 mm today versus 8 x 6 mm in September 2015 without significant change. No other nodules or masses. Upper Abdomen: Previous cholecystectomy. No acute abnormalities noted. Musculoskeletal: No chest wall abnormality. No acute or significant osseous findings. Review of the MIP images confirms the above findings. IMPRESSION: 1. Evaluation for pulmonary emboli in the lung bases is limited due to respiratory motion. No central emboli identified. 2. Left lower lobe infiltrate most likely pneumonia. Recommend short-term follow-up after treatment to ensure resolution. 3. Two nodules in the right lungs. Given difference in slice selection, no significant change since 2015. Recommend attention on follow-up. 4. Mildly prominent mediastinal nodes are likely reactive. Recommend attention on follow-up. 5. Tiny left pleural effusion, likely reactive. 6. Atherosclerotic changes in the thoracic aorta. Coronary artery calcifications. Aortic Atherosclerosis (ICD10-I70.0). Electronically Signed   By: DDorise BullionIII M.D   On: 07/18/2018 17:10    Microbiology: Recent Results (from the past 240 hour(s))  Blood culture (routine x 2)     Status: None (Preliminary result)   Collection Time: 07/18/18  8:02 AM  Result Value Ref Range Status   Specimen Description   Final    BLOOD LEFT ANTECUBITAL Performed at [facility] [address], [place] [address]   Special Requests   Final    BOTTLES DRAWN AEROBIC AND ANAEROBIC Blood Culture adequate volume Performed at [facility] [address], [id number]   Culture  Setup Time   Final    GRAM POSITIVE COCCI IN CLUSTERS IN BOTH AEROBIC AND ANAEROBIC BOTTLES CRITICAL RESULT CALLED TO, READ BACK BY AND VERIFIED WITH: AKarie KirksPharmD 9:40 07/19/18 ([email or web address]) Performed at  [facility] [address], [place]  [address]   [facility]  Final   Report Status PENDING  Incomplete  Blood Culture ID Panel (Reflexed)     Status: Abnormal   Collection Time: 07/18/18  8:02 AM  Result Value Ref Range Status   Enterococcus species NOT DETECTED NOT DETECTED Final   Listeria  monocytogenes NOT DETECTED NOT DETECTED Final   Staphylococcus species DETECTED (A) NOT DETECTED Final    Comment: Methicillin (oxacillin) susceptible coagulase negative staphylococcus. Possible blood culture contaminant (unless isolated from more than one blood culture draw or clinical case suggests pathogenicity). No antibiotic treatment is indicated for blood  culture contaminants. CRITICAL RESULT CALLED TO, READ BACK BY AND VERIFIED WITH: Karie Kirks PharmD 9:40 07/19/18 ([email or web address])    Staphylococcus aureus NOT DETECTED NOT DETECTED Final   Methicillin resistance NOT DETECTED NOT DETECTED Final   Streptococcus species NOT DETECTED NOT DETECTED Final   Streptococcus agalactiae NOT DETECTED NOT DETECTED Final   Streptococcus pneumoniae NOT DETECTED NOT DETECTED Final   Streptococcus pyogenes NOT DETECTED NOT DETECTED Final   Acinetobacter baumannii NOT DETECTED NOT DETECTED Final   Enterobacteriaceae species NOT DETECTED NOT DETECTED Final   Enterobacter cloacae complex NOT DETECTED NOT DETECTED Final   Escherichia coli NOT DETECTED NOT DETECTED Final   Klebsiella oxytoca NOT DETECTED NOT DETECTED Final   Klebsiella pneumoniae NOT DETECTED NOT DETECTED Final   Proteus species NOT DETECTED NOT DETECTED Final   Serratia marcescens NOT DETECTED NOT DETECTED Final   Haemophilus influenzae NOT DETECTED NOT DETECTED Final   Neisseria meningitidis NOT DETECTED NOT DETECTED Final   Pseudomonas aeruginosa NOT DETECTED NOT DETECTED Final   Candida albicans NOT DETECTED NOT DETECTED Final   Candida glabrata NOT DETECTED NOT DETECTED Final   Candida krusei NOT DETECTED NOT  DETECTED Final   Candida parapsilosis NOT DETECTED NOT DETECTED Final   Candida tropicalis NOT DETECTED NOT DETECTED Final    Comment: Performed at [facility], [address], [place], [place] [address]  Culture, blood (routine x 2) Call MD if unable to obtain prior to antibiotics being given     Status: None (Preliminary result)   Collection Time: 07/18/18  1:16 PM  Result Value Ref Range Status   Specimen Description   Final    BLOOD RIGHT ANTECUBITAL Performed at [place] [address], [place], [place] [address]    Special Requests   Final    BOTTLES DRAWN AEROBIC AND ANAEROBIC Blood Culture adequate volume Performed at [facility] [address], [place], [place] [address]    Culture   Final    NO GROWTH 2 DAYS Performed at [facility] [address], [place], [place] [address]    Report Status PENDING  Incomplete  Culture, blood (routine x 2) Call MD if unable to obtain prior to antibiotics being given     Status: None (Preliminary result)   Collection Time: 07/18/18  1:22 PM  Result Value Ref Range Status   Specimen Description   Final    BLOOD RIGHT HAND Performed at [facility] [address] [place], [place] [address]    Special Requests   Final    BOTTLES DRAWN AEROBIC ONLY Blood Culture results may not be optimal due to an inadequate volume of blood received in culture bottles Performed at [facility] [address] [place], [place] [address]    Culture   Final    NO GROWTH 2 DAYS Performed at [facility] [address] [place], [place] [address]    Report Status PENDING  Incomplete     Labs: Basic Metabolic Panel: Recent Labs  Lab 07/18/18 0619 07/18/18 1316 07/19/18 0420 07/20/18 0408  NA 139  --  139 138  K 4.2  --  4.8 3.9  CL 103  --  107 105  CO2 24  --  25 24  GLUCOSE 275*  --  223* 302*  BUN 26*  --  20 31*  CREATININE 0.91 1.01  0.94 0.87  CALCIUM 8.7*  --  8.5* 8.7*   Liver Function Tests: No results for input(s): AST, ALT, ALKPHOS, BILITOT, PROT, ALBUMIN in the last 168 hours. No results for input(s): LIPASE, AMYLASE in the last 168 hours. No results for input(s): AMMONIA in the last 168 hours. CBC: Recent Labs  Lab 07/18/18 0619 07/18/18 1316 07/19/18 0420 07/20/18 0408  WBC 10.5 12.2* 10.7* 11.1*  NEUTROABS 7.7  --   --  9.0*  HGB 12.9* 11.7* 10.8* 10.4*  HCT 39.4 36.2* 33.3* 32.1*  MCV 85.1 85.8 85.6 85.4  PLT 239 198 186 215   Cardiac Enzymes: Recent Labs  Lab 07/18/18 0802  TROPONINI <0.03   BNP: BNP (last 3 results) Recent Labs    07/23/17 0013  BNP 62.1    ProBNP (last 3 results) No results for input(s): PROBNP in the last 8760 hours.  CBG: Recent Labs  Lab 07/19/18 0735 07/19/18 1203 07/19/18 1620 07/19/18 2101 07/20/18 0737  GLUCAP 190* 221* 237* 248* 246*       Signed:  Alma Friendly, MD [facility] 07/20/2018, 10:45 AM

## 2018-07-20 NOTE — Discharge Summary (Signed)
Discharge Summary  Ryan MCCRYSTAL WLN:[id number] DOB: August 31, 1946  PCP: Colon Branch, MD  Admit date: 07/18/2018 Discharge date: 07/20/2018  Time spent: 40 mins   Recommendations for Outpatient Follow-up:  1. PCP   Discharge Diagnoses:  Active Hospital Problems   Diagnosis Date Noted  . Acute respiratory failure ([place]) 07/18/2018  . CAP (community acquired pneumonia) 07/18/2018  . Acute respiratory failure with hypoxia ([place]) 07/18/2018  . Essential hypertension, benign 01/16/2010  . Asthma exacerbation 11/26/2009  . GERD 10/14/2009  . Bipolar disorder ([place]) 02/20/2009  . DM type 2 with diabetic peripheral neuropathy ([place]) 02/22/2007    Resolved Hospital Problems   Diagnosis Date Noted Date Resolved  . COPD with acute exacerbation ([place]) 07/18/2018 07/18/2018    Discharge Condition: Stable  Diet recommendation: Heart healthy/mod carb  Vitals:   07/20/18 0802 07/20/18 1000  BP:  126/65  Pulse:  75  Resp:  17  Temp:  97.8 F (36.6 C)  SpO2: 94%     History of present illness:  Patient is a 72 year old male with history of moderate persistent asthma, bipolar disorder, depression, diabetes, GERD, hypertension, hyperlipidemia, history of stroke presented to ED with acute shortness of breath with wheezing and dry cough X 1 day. Patient also had left-sided chest heaviness with wheezing, worse on deep breathing, 6/10, with no radiation, no longer present in ED. Pt denied any recent sick contacts or antibiotic use. No recent long distance car rides or air flights or leg swelling. Pt admitted for further management.  Today, pt reported feeling much better. Pt denies any chest pain, abdominal pain, fever/chills, N/V/D. Stable for discharge  Hospital Course:  Principal Problem:   Acute respiratory failure ([place]) Active Problems:   DM type 2 with diabetic peripheral neuropathy ([facility])   Bipolar disorder ([place])   Essential hypertension, benign   Asthma exacerbation   GERD   CAP  (community acquired pneumonia)   Acute respiratory failure with hypoxia ([place])  Acute hypoxic respiratory failure 2/2 CAP Afebrile, with mild leukocytosis (on steroids) BC X 2 NGTD, initial BC in [facility] showed gram + cocci in clusters likely contaminant Urine strep antigen negative, legionella pending LA resolved 2.3-->0.8 D-dimer elevated, CTA chest showed: Evaluation for pulmonary emboli in the lung bases is limited due to respiratory motion. No central emboli identified. Left lower lobe infiltrate most likely pneumonia S/P IV rocephin, zithromax, will d/c pt on PO Augmentin for a total of 7 days of AB and PO Azithromycin for a total of 5 days. S/P solu-medrol, will d/c on a tapered dose of PO prednisone Continue home nebs, inhaler Follow up with PCP in 1 week  DM type 2 with peripheral neuropathy Last A1c 6.4 Continue home oral hypoglycemics, pt advised to closely monitor his CBGs as he is currently on steroids  Bipolar disorder Continue cymbalta, klonopin  Essential hypertension Stable Continue losartan  GERD Continue Protonix  History of prior CVA Continue Plavix   Procedures:  None   Consultations:  None  Discharge Exam: BP 126/65 (BP Location: Right Arm)   Pulse 75   Temp 97.8 F (36.6 C) (Oral)   Resp 17   Ht 5' 9" (1.753 m)   Wt 106.6 kg   SpO2 94%   BMI 34.70 kg/m   General: NAD Cardiovascular: S1, S2 present Respiratory: Diminished BS bilaterally. No wheezing noted  Discharge Instructions You were cared for by a hospitalist during your hospital stay. If you have any questions about your discharge medications or the care  you received while you were in the hospital after you are discharged, you can call the unit and asked to speak with the hospitalist on call if the hospitalist that took care of you is not available. Once you are discharged, your primary care physician will handle any further medical issues. Please note that NO REFILLS for any  discharge medications will be authorized once you are discharged, as it is imperative that you return to your primary care physician (or establish a relationship with a primary care physician if you do not have one) for your aftercare needs so that they can reassess your need for medications and monitor your lab values.   Allergies as of 07/20/2018      Reactions   Smz-tmp Ds [sulfamethoxazole-trimethoprim] Nausea And Vomiting   Divalproex Sodium Other (See Comments)   Pancreatitis    Methocarbamol Other (See Comments)   hallucinations   Other    "Symbrax" alteration in blood sugar, pt unsure if low or high blood sugar   Paroxetine Other (See Comments)   Insomnia   Percocet [oxycodone-acetaminophen] Other (See Comments)   Hyperactivity   Risperidone Other (See Comments)   REACTION: hyper   Wellbutrin [bupropion] Other (See Comments)   Hot flashes   Citalopram Hydrobromide Other (See Comments)   Insomnia      Medication List    TAKE these medications   albuterol 108 (90 Base) MCG/ACT inhaler Commonly known as:  PROVENTIL HFA;VENTOLIN HFA Inhale 2 puffs into the lungs every 6 (six) hours as needed for wheezing or shortness of breath.   albuterol (2.5 MG/3ML) 0.083% nebulizer solution Commonly known as:  PROVENTIL Take 3 mLs (2.5 mg total) by nebulization every 6 (six) hours as needed for wheezing or shortness of breath.   amoxicillin-clavulanate 875-125 MG tablet Commonly known as:  AUGMENTIN Take 1 tablet by mouth every 12 (twelve) hours for 4 days. Start taking on:  07/21/2018   augmented betamethasone dipropionate 0.05 % ointment Commonly known as:  DIPROLENE-AF Apply topically 2 (two) times daily as needed. What changed:    how much to take  reasons to take this   azithromycin 500 MG tablet Commonly known as:  ZITHROMAX Take 1 tablet (500 mg total) by mouth daily for 2 days. Start taking on:  07/21/2018   budesonide-formoterol 160-4.5 MCG/ACT inhaler Commonly known  as:  SYMBICORT Inhale 2 puffs into the lungs 2 (two) times daily.   clonazePAM 0.5 MG tablet Commonly known as:  KLONOPIN Take 0.5 mg by mouth 2 (two) times daily. 1 mg in the morning and 0.5 mg in the evening.   clopidogrel 75 MG tablet Commonly known as:  PLAVIX Take 1 tablet (75 mg total) by mouth daily. What changed:  when to take this   clotrimazole-betamethasone cream Commonly known as:  LOTRISONE Apply 1 application topically 2 (two) times daily.   COMPLETE PROSTATE HEALTH Tabs Take 1 tablet by mouth every morning.   CYMBALTA 60 MG capsule Generic drug:  DULoxetine Take 60 mg by mouth every morning.   EPIPEN 2-PAK 0.3 mg/0.3 mL Soaj injection Generic drug:  EPINEPHrine Reported on 04/29/2016   esomeprazole 40 MG capsule Commonly known as:  NEXIUM Take 80 mg by mouth every morning.   ferrous sulfate 325 (65 FE) MG tablet Take 1 tablet (325 mg total) by mouth 2 (two) times daily with a meal.   fexofenadine 180 MG tablet Commonly known as:  ALLEGRA Take 180 mg by mouth every morning.   glucose blood test  strip Check blood sugar no more than twice daily   lamoTRIgine 25 MG tablet Commonly known as:  LAMICTAL Take 50 mg by mouth at bedtime.   losartan 25 MG tablet Commonly known as:  COZAAR Take 1 tablet (25 mg total) by mouth daily. What changed:  when to take this   multivitamin tablet Take 1 tablet by mouth daily.   onetouch ultrasoft lancets Check blood sugars no more than twice daily   pioglitazone-metformin 15-850 MG tablet Commonly known as:  ACTOPLUS MET Take 1 tablet by mouth 2 (two) times daily with a meal.   predniSONE 10 MG tablet Commonly known as:  DELTASONE Take 4 tabs daily for 2 days, then 3 tabs daily for 2 days, then 2 tabs daily for 2 days and 1 tab daily for 2 days and finish   SAPHRIS 5 MG Subl 24 hr tablet Generic drug:  asenapine Place 5 mg under the tongue at bedtime.   simvastatin 40 MG tablet Commonly known as:   ZOCOR Take 1 tablet (40 mg total) by mouth at bedtime.   SUPREP BOWEL PREP KIT 17.5-3.13-1.6 GM/177ML Soln Generic drug:  Na Sulfate-K Sulfate-Mg Sulf Take 1 Bottle by mouth as directed.   traZODone 50 MG tablet Commonly known as:  DESYREL Take 50 mg by mouth at bedtime as needed for sleep.   VERAMYST 27.5 MCG/SPRAY nasal spray Generic drug:  fluticasone Place 2 sprays into the nose daily as needed for rhinitis or allergies.      Allergies  Allergen Reactions  . Smz-Tmp Ds [Sulfamethoxazole-Trimethoprim] Nausea And Vomiting  . Divalproex Sodium Other (See Comments)    Pancreatitis   . Methocarbamol Other (See Comments)    hallucinations  . Other     "Symbrax" alteration in blood sugar, pt unsure if low or high blood sugar  . Paroxetine Other (See Comments)    Insomnia  . Percocet [Oxycodone-Acetaminophen] Other (See Comments)    Hyperactivity  . Risperidone Other (See Comments)    REACTION: hyper  . Wellbutrin [Bupropion] Other (See Comments)    Hot flashes  . Citalopram Hydrobromide Other (See Comments)    Insomnia   Follow-up Information    Colon Branch, MD. Schedule an appointment as soon as possible for a visit in 1 week(s).   Specialty:  Internal Medicine Contact information: [address] [place] [address] [phone]            The results of significant diagnostics from this hospitalization (including imaging, microbiology, ancillary and laboratory) are listed below for reference.    Significant Diagnostic Studies: Dg Chest 2 View  Result Date: 07/18/2018 CLINICAL DATA:  Increased shortness of breath, COPD, asthma, diabetes mellitus, hypertension, former smoker EXAM: CHEST - 2 VIEW COMPARISON:  01/24/2018 FINDINGS: Minimal enlargement cardiac silhouette. Mediastinal contours and pulmonary vascularity normal. Bronchitic changes with mild RIGHT basilar atelectasis. Increased LEFT lower lobe opacity question atelectasis versus  consolidation. Upper lungs clear. No pleural effusion or pneumothorax. Bones demineralized. Surgical clips RIGHT upper quadrant. IMPRESSION: LEFT lower lobe opacity question atelectasis versus pneumonia. Electronically Signed   By: Lavonia Dana M.D.   On: 07/18/2018 07:08   Ct Angio Chest Pe W Or Wo Contrast  Result Date: 07/18/2018 CLINICAL DATA:  Acute shortness of breath. Wheezing. Cough. No fever or chills. EXAM: CT ANGIOGRAPHY CHEST WITH CONTRAST TECHNIQUE: Multidetector CT imaging of the chest was performed using the standard protocol during bolus administration of intravenous contrast. Multiplanar CT image reconstructions and  MIPs were obtained to evaluate the vascular anatomy. CONTRAST:  142m ISOVUE-370 IOPAMIDOL (ISOVUE-370) INJECTION 76% COMPARISON:  Chest x-ray July 18, 2018. Chest CT August 19, 2016. FINDINGS: Cardiovascular: Cardiomegaly. Coronary artery calcifications. The thoracic aorta demonstrates atherosclerotic change without aneurysm or dissection. Evaluation of the pulmonary arteries in the lung bases is limited due to respiratory motion. No definite pulmonary emboli identified. Mediastinum/Nodes: The thyroid and esophagus are unremarkable. There is a tiny left pleural effusion which is new. No right pleural effusion or pericardial effusion noted. A prevascular node on series 6, image 29 measures 10.5 mm in short axis versus 10 mm in October 2017, probably reactive. A few other shotty nodes are probably reactive. Lungs/Pleura: Central airways are normal. No pneumothorax. There is an infiltrate centered in the left lower lobe. Mild infiltrate seen in the lingula posteriorly as well. There is volume loss in the left lower lobe. Mild opacity in the medial right lung base is probably atelectasis. No other infiltrate identified. A nodule in the right upper lobe on series 8, image 50 measures 5 x 5 mm today versus 6 by 4 mm in October of 2017, 5 x 6 mm in February of 2017, and 6 by 4.5 mm in  September of 2015. The slight differences in size may be due to difference in slice selection. A nodule in the right lung on series 8, image 62 measures 9 by 6 mm today versus 8 x 6 mm in September 2015 without significant change. No other nodules or masses. Upper Abdomen: Previous cholecystectomy. No acute abnormalities noted. Musculoskeletal: No chest wall abnormality. No acute or significant osseous findings. Review of the MIP images confirms the above findings. IMPRESSION: 1. Evaluation for pulmonary emboli in the lung bases is limited due to respiratory motion. No central emboli identified. 2. Left lower lobe infiltrate most likely pneumonia. Recommend short-term follow-up after treatment to ensure resolution. 3. Two nodules in the right lungs. Given difference in slice selection, no significant change since 2015. Recommend attention on follow-up. 4. Mildly prominent mediastinal nodes are likely reactive. Recommend attention on follow-up. 5. Tiny left pleural effusion, likely reactive. 6. Atherosclerotic changes in the thoracic aorta. Coronary artery calcifications. Aortic Atherosclerosis (ICD10-I70.0). Electronically Signed   By: DDorise BullionIII M.D   On: 07/18/2018 17:10    Microbiology: Recent Results (from the past 240 hour(s))  Blood culture (routine x 2)     Status: None (Preliminary result)   Collection Time: 07/18/18  8:02 AM  Result Value Ref Range Status   Specimen Description   Final    BLOOD LEFT ANTECUBITAL Performed at [facility] [address], [place] [address]   Special Requests   Final    BOTTLES DRAWN AEROBIC AND ANAEROBIC Blood Culture adequate volume Performed at [facility] [address], [id number]   Culture  Setup Time   Final    GRAM POSITIVE COCCI IN CLUSTERS IN BOTH AEROBIC AND ANAEROBIC BOTTLES CRITICAL RESULT CALLED TO, READ BACK BY AND VERIFIED WITH: AKarie KirksPharmD 9:40 07/19/18 ([email or web address]) Performed at  [facility] [address], [place] [address]   [facility]  Final   Report Status PENDING  Incomplete  Blood Culture ID Panel (Reflexed)     Status: Abnormal   Collection Time: 07/18/18  8:02 AM  Result Value Ref Range Status   Enterococcus species NOT DETECTED NOT DETECTED Final   Listeria  monocytogenes NOT DETECTED NOT DETECTED Final   Staphylococcus species DETECTED (A) NOT DETECTED Final    Comment: Methicillin (oxacillin) susceptible coagulase negative staphylococcus. Possible blood culture contaminant (unless isolated from more than one blood culture draw or clinical case suggests pathogenicity). No antibiotic treatment is indicated for blood  culture contaminants. CRITICAL RESULT CALLED TO, READ BACK BY AND VERIFIED WITH: Karie Kirks PharmD 9:40 07/19/18 ([email or web address])    Staphylococcus aureus NOT DETECTED NOT DETECTED Final   Methicillin resistance NOT DETECTED NOT DETECTED Final   Streptococcus species NOT DETECTED NOT DETECTED Final   Streptococcus agalactiae NOT DETECTED NOT DETECTED Final   Streptococcus pneumoniae NOT DETECTED NOT DETECTED Final   Streptococcus pyogenes NOT DETECTED NOT DETECTED Final   Acinetobacter baumannii NOT DETECTED NOT DETECTED Final   Enterobacteriaceae species NOT DETECTED NOT DETECTED Final   Enterobacter cloacae complex NOT DETECTED NOT DETECTED Final   Escherichia coli NOT DETECTED NOT DETECTED Final   Klebsiella oxytoca NOT DETECTED NOT DETECTED Final   Klebsiella pneumoniae NOT DETECTED NOT DETECTED Final   Proteus species NOT DETECTED NOT DETECTED Final   Serratia marcescens NOT DETECTED NOT DETECTED Final   Haemophilus influenzae NOT DETECTED NOT DETECTED Final   Neisseria meningitidis NOT DETECTED NOT DETECTED Final   Pseudomonas aeruginosa NOT DETECTED NOT DETECTED Final   Candida albicans NOT DETECTED NOT DETECTED Final   Candida glabrata NOT DETECTED NOT DETECTED Final   Candida krusei NOT DETECTED NOT  DETECTED Final   Candida parapsilosis NOT DETECTED NOT DETECTED Final   Candida tropicalis NOT DETECTED NOT DETECTED Final    Comment: Performed at [facility], [address], [place], [place] [address]  Culture, blood (routine x 2) Call MD if unable to obtain prior to antibiotics being given     Status: None (Preliminary result)   Collection Time: 07/18/18  1:16 PM  Result Value Ref Range Status   Specimen Description   Final    BLOOD RIGHT ANTECUBITAL Performed at [place] [address], [place], [place] [address]    Special Requests   Final    BOTTLES DRAWN AEROBIC AND ANAEROBIC Blood Culture adequate volume Performed at [facility] [facility] [address], [place], [place] [address]    Culture   Final    NO GROWTH 2 DAYS Performed at [facility] [address], [place], [place] [address]    Report Status PENDING  Incomplete  Culture, blood (routine x 2) Call MD if unable to obtain prior to antibiotics being given     Status: None (Preliminary result)   Collection Time: 07/18/18  1:22 PM  Result Value Ref Range Status   Specimen Description   Final    BLOOD RIGHT HAND Performed at [facility] [address] [place], [place] [address]    Special Requests   Final    BOTTLES DRAWN AEROBIC ONLY Blood Culture results may not be optimal due to an inadequate volume of blood received in culture bottles Performed at [facility] [address] [place], [place] [address]    Culture   Final    NO GROWTH 2 DAYS Performed at [facility] [address] [place], [place] [address]    Report Status PENDING  Incomplete     Labs: Basic Metabolic Panel: Recent Labs  Lab 07/18/18 0619 07/18/18 1316 07/19/18 0420 07/20/18 0408  NA 139  --  139 138  K 4.2  --  4.8 3.9  CL 103  --  107 105  CO2 24  --  25 24  GLUCOSE 275*  --  223* 302*  BUN 26*  --  20 31*  CREATININE 0.91 1.01  0.94 0.87  CALCIUM 8.7*  --  8.5* 8.7*   Liver Function Tests: No results for input(s): AST, ALT, ALKPHOS, BILITOT, PROT, ALBUMIN in the last 168 hours. No results for input(s): LIPASE, AMYLASE in the last 168 hours. No results for input(s): AMMONIA in the last 168 hours. CBC: Recent Labs  Lab 07/18/18 0619 07/18/18 1316 07/19/18 0420 07/20/18 0408  WBC 10.5 12.2* 10.7* 11.1*  NEUTROABS 7.7  --   --  9.0*  HGB 12.9* 11.7* 10.8* 10.4*  HCT 39.4 36.2* 33.3* 32.1*  MCV 85.1 85.8 85.6 85.4  PLT 239 198 186 215   Cardiac Enzymes: Recent Labs  Lab 07/18/18 0802  TROPONINI <0.03   BNP: BNP (last 3 results) Recent Labs    07/23/17 0013  BNP 62.1    ProBNP (last 3 results) No results for input(s): PROBNP in the last 8760 hours.  CBG: Recent Labs  Lab 07/19/18 0735 07/19/18 1203 07/19/18 1620 07/19/18 2101 07/20/18 0737  GLUCAP 190* 221* 237* 248* 246*       Signed:  Alma Friendly, MD [facility] 07/20/2018, 10:57 AM

## 2018-07-21 ENCOUNTER — Telehealth: Payer: Self-pay

## 2018-07-21 LAB — CULTURE, BLOOD (ROUTINE X 2): Special Requests: ADEQUATE

## 2018-07-21 NOTE — Telephone Encounter (Signed)
07/21/18  Transition Care Management Follow-up Telephone Call  ADMISSION DATE: 07/18/2018  DISCHARGE DATE: 07/20/2018   How have you been since you were released from the hospital? Feeling pretty good so far per patient.   Do you understand why you were in the hospital? Yes   Do you understand the discharge instrcutions? Yes    Items Reviewed:  Medications reviewed: Yes   Allergies reviewed: Yes no new allergies  Dietary changes reviewed:Heart healthy Modified Carbohydrates   Referrals reviewed: Appointments scheduled   Functional Questionnaire:   Activities of Daily Living (ADLs): Patient states he is able to perform all ADL'S independently.  Any patient concerns? Would like to know if he needs to be referred to a Pulmonologist due to recurring Pneumonia.  Confirmed importance and date/time of follow-up visits scheduled:Yes  Confirmed with patient f condition begins to worsen call PCP or go to the ER. Yes    Patient was given the office number and encouragred to call back with questions or concerns. Yes

## 2018-07-21 NOTE — Telephone Encounter (Signed)
noted 

## 2018-07-23 LAB — CULTURE, BLOOD (ROUTINE X 2)
Culture: NO GROWTH
Culture: NO GROWTH
Special Requests: ADEQUATE

## 2018-07-28 ENCOUNTER — Encounter: Payer: Medicare Other | Admitting: Gastroenterology

## 2018-07-31 ENCOUNTER — Ambulatory Visit (INDEPENDENT_AMBULATORY_CARE_PROVIDER_SITE_OTHER): Payer: Medicare Other | Admitting: Internal Medicine

## 2018-07-31 ENCOUNTER — Encounter: Payer: Self-pay | Admitting: Internal Medicine

## 2018-07-31 VITALS — BP 118/68 | HR 63 | Temp 97.8°F | Resp 16 | Ht 69.0 in | Wt 241.4 lb

## 2018-07-31 DIAGNOSIS — E1142 Type 2 diabetes mellitus with diabetic polyneuropathy: Secondary | ICD-10-CM | POA: Diagnosis not present

## 2018-07-31 DIAGNOSIS — J181 Lobar pneumonia, unspecified organism: Secondary | ICD-10-CM

## 2018-07-31 DIAGNOSIS — J189 Pneumonia, unspecified organism: Secondary | ICD-10-CM

## 2018-07-31 LAB — CBC WITH DIFFERENTIAL/PLATELET
Basophils Absolute: 0 10*3/uL (ref 0.0–0.1)
Basophils Relative: 0.6 % (ref 0.0–3.0)
Eosinophils Absolute: 0.2 10*3/uL (ref 0.0–0.7)
Eosinophils Relative: 2.1 % (ref 0.0–5.0)
HCT: 38.1 % — ABNORMAL LOW (ref 39.0–52.0)
Hemoglobin: 12.5 g/dL — ABNORMAL LOW (ref 13.0–17.0)
Lymphocytes Relative: 27.7 % (ref 12.0–46.0)
Lymphs Abs: 2.3 10*3/uL (ref 0.7–4.0)
MCHC: 32.8 g/dL (ref 30.0–36.0)
MCV: 84.5 fl (ref 78.0–100.0)
Monocytes Absolute: 0.6 10*3/uL (ref 0.1–1.0)
Monocytes Relative: 6.7 % (ref 3.0–12.0)
Neutro Abs: 5.2 10*3/uL (ref 1.4–7.7)
Neutrophils Relative %: 62.9 % (ref 43.0–77.0)
Platelets: 243 10*3/uL (ref 150.0–400.0)
RBC: 4.51 Mil/uL (ref 4.22–5.81)
RDW: 16.8 % — ABNORMAL HIGH (ref 11.5–15.5)
WBC: 8.3 10*3/uL (ref 4.0–10.5)

## 2018-07-31 LAB — BASIC METABOLIC PANEL
BUN: 24 mg/dL — ABNORMAL HIGH (ref 6–23)
CO2: 31 mEq/L (ref 19–32)
Calcium: 8.8 mg/dL (ref 8.4–10.5)
Chloride: 102 mEq/L (ref 96–112)
Creatinine, Ser: 0.91 mg/dL (ref 0.40–1.50)
GFR: 87.5 mL/min (ref >60.00)
Glucose, Bld: 133 mg/dL — ABNORMAL HIGH (ref 70–99)
Potassium: 5 mEq/L (ref 3.5–5.1)
Sodium: 138 mEq/L (ref 135–145)

## 2018-07-31 NOTE — Progress Notes (Signed)
Pre visit review using our clinic review tool, if applicable. No additional management support is needed unless otherwise documented below in the visit note. 

## 2018-07-31 NOTE — Patient Instructions (Signed)
GO TO THE LAB : Get the blood work     GO TO THE FRONT DESK Schedule your next appointment for a   Physical in 4 weeks form now

## 2018-07-31 NOTE — Progress Notes (Signed)
Subjective:    Patient ID: Ryan Zhang, male    DOB: Mar 21, 1946, 72 y.o.   MRN: [id number]  DOS:  07/31/2018 Type of visit - description : TCM 14 Interval history: Patient was admitted to the hospital and discharged on 07/20/2018. Initially had shortness of breath, wheezing and cough. He was diagnosed with acute respiratory failure, chest x-ray show a left lower lobe infiltrate. Was treated with antibiotics and discharged on Augmentin as well as steroids by mouth.  Was recommended nebulizers and inhalers.. D-dimer was elevated, CTA chest show no pulmonary emboli.  Review of Systems Since he left the hospital, he took the medications as prescribed. He already finished prednisone and antibiotics. CBGs went in the 300s while on prednisone, they are back down to day 100, 120 range.  Still occasionally 160. He noted polyuria while on prednisone but that is getting better. No fever chills No cough at this point.  Stopped using the nebulizer yesterday, does not feel he needs it. No nausea, vomiting, diarrhea.   Past Medical History:  Diagnosis Date  . Allergy    allergy shots Dr. Velora Heckler  . Anemia   . Anxiety   . Asthma    moderate persistant  . Bipolar affective ([place])   . Depression   . Diabetes mellitus   . DJD (degenerative joint disease)   . Dysphagia   . Gallstones    hx of, s/p cholecystectomy  . GERD (gastroesophageal reflux disease)   . Hepatitis A    as teenager 57's  . Hollenhorst plaque    right eye  . Hyperlipidemia   . Hypertension   . Kidney stones    hx of  . Meniere's disease   . Motility disorder, esophageal   . Obesity   . Pancreatitis   . Personal history of colonic polyps 11/2004   hyperplastic.  . Stroke [facility])    per MRI    Past Surgical History:  Procedure Laterality Date  . CATARACT EXTRACTION Bilateral 09/2015 and 10/2015  . CHOLECYSTECTOMY    . COLONOSCOPY    . TOTAL KNEE ARTHROPLASTY Bilateral    both knees    Social History    Socioeconomic History  . Marital status: Married    Spouse name: Not on file  . Number of children: 1  . Years of education: 65  . Highest education level: Bachelor's degree (e.g., BA, AB, BS)  Occupational History  . Occupation: disabled    Employer: DISABLED    Comment: laboratory computing/programming  Social Needs  . Financial resource strain: Not hard at all  . Food insecurity:    Worry: Never true    Inability: Never true  . Transportation needs:    Medical: No    Non-medical: No  Tobacco Use  . Smoking status: Former Smoker    Last attempt to quit: 11/15/1964    Years since quitting: 53.7  . Smokeless tobacco: Never Used  . Tobacco comment: for 1 year only   Substance and Sexual Activity  . Alcohol use: Not Currently  . Drug use: No  . Sexual activity: Never  Lifestyle  . Physical activity:    Days per week: 0 days    Minutes per session: 0 min  . Stress: Not at all  Relationships  . Social connections:    Talks on phone: More than three times a week    Gets together: More than three times a week    Attends religious service: More than 4 times per  year    Active member of club or organization: Yes    Attends meetings of clubs or organizations: 1 to 4 times per year    Relationship status: Married  . Intimate partner violence:    Fear of current or ex partner: No    Emotionally abused: No    Physically abused: No    Forced sexual activity: No  Other Topics Concern  . Not on file  Social History Narrative   Household-- pt , wife, adopted  daughter (h/o substance abuse,on a methadone program, doing better )      Allergies as of 07/31/2018      Reactions   Smz-tmp Ds [sulfamethoxazole-trimethoprim] Nausea And Vomiting   Divalproex Sodium Other (See Comments)   Pancreatitis    Methocarbamol Other (See Comments)   hallucinations   Other    "Symbrax" alteration in blood sugar, pt unsure if low or high blood sugar   Paroxetine Other (See Comments)    Insomnia   Percocet [oxycodone-acetaminophen] Other (See Comments)   Hyperactivity   Risperidone Other (See Comments)   REACTION: hyper   Wellbutrin [bupropion] Other (See Comments)   Hot flashes   Citalopram Hydrobromide Other (See Comments)   Insomnia      Medication List        Accurate as of 07/31/18 11:47 AM. Always use your most recent med list.          albuterol 108 (90 Base) MCG/ACT inhaler Commonly known as:  PROVENTIL HFA;VENTOLIN HFA Inhale 2 puffs into the lungs every 6 (six) hours as needed for wheezing or shortness of breath.   albuterol (2.5 MG/3ML) 0.083% nebulizer solution Commonly known as:  PROVENTIL Take 3 mLs (2.5 mg total) by nebulization every 6 (six) hours as needed for wheezing or shortness of breath.   augmented betamethasone dipropionate 0.05 % ointment Commonly known as:  DIPROLENE-AF Apply topically 2 (two) times daily as needed.   budesonide-formoterol 160-4.5 MCG/ACT inhaler Commonly known as:  SYMBICORT Inhale 2 puffs into the lungs 2 (two) times daily.   clonazePAM 0.5 MG tablet Commonly known as:  KLONOPIN Take 0.5 mg by mouth 2 (two) times daily. 1 mg in the morning and 0.5 mg in the evening.   clopidogrel 75 MG tablet Commonly known as:  PLAVIX Take 1 tablet (75 mg total) by mouth daily.   clotrimazole-betamethasone cream Commonly known as:  LOTRISONE Apply 1 application topically 2 (two) times daily.   COMPLETE PROSTATE HEALTH Tabs Take 1 tablet by mouth every morning.   CYMBALTA 60 MG capsule Generic drug:  DULoxetine Take 60 mg by mouth every morning.   EPIPEN 2-PAK 0.3 mg/0.3 mL Soaj injection Generic drug:  EPINEPHrine Reported on 04/29/2016   esomeprazole 40 MG capsule Commonly known as:  NEXIUM Take 80 mg by mouth every morning.   ferrous sulfate 325 (65 FE) MG tablet Take 1 tablet (325 mg total) by mouth 2 (two) times daily with a meal.   fexofenadine 180 MG tablet Commonly known as:  ALLEGRA Take 180 mg by  mouth every morning.   glucose blood test strip Check blood sugar no more than twice daily   lamoTRIgine 25 MG tablet Commonly known as:  LAMICTAL Take 50 mg by mouth at bedtime.   losartan 25 MG tablet Commonly known as:  COZAAR Take 1 tablet (25 mg total) by mouth daily.   multivitamin tablet Take 1 tablet by mouth daily.   onetouch ultrasoft lancets Check blood sugars no more than  twice daily   pioglitazone-metformin 15-850 MG tablet Commonly known as:  ACTOPLUS MET Take 1 tablet by mouth 2 (two) times daily with a meal.   predniSONE 10 MG tablet Commonly known as:  DELTASONE Take 4 tabs daily for 2 days, then 3 tabs daily for 2 days, then 2 tabs daily for 2 days and 1 tab daily for 2 days and finish   SAPHRIS 5 MG Subl 24 hr tablet Generic drug:  asenapine Place 5 mg under the tongue at bedtime.   simvastatin 40 MG tablet Commonly known as:  ZOCOR Take 1 tablet (40 mg total) by mouth at bedtime.   SUPREP BOWEL PREP KIT 17.5-3.13-1.6 GM/177ML Soln Generic drug:  Na Sulfate-K Sulfate-Mg Sulf Take 1 Bottle by mouth as directed.   traZODone 50 MG tablet Commonly known as:  DESYREL Take 50 mg by mouth at bedtime as needed for sleep.   VERAMYST 27.5 MCG/SPRAY nasal spray Generic drug:  fluticasone Place 2 sprays into the nose daily as needed for rhinitis or allergies.          Objective:   Physical Exam BP 118/68 (BP Location: Left Arm, Patient Position: Sitting, Cuff Size: Small)   Pulse 63   Temp 97.8 F (36.6 C) (Oral)   Resp 16   Ht _0  (1.753 m)   Wt 241 lb 6 oz (109.5 kg)   SpO2 93%   BMI 35.64 kg/m   General: Well developed, NAD, see BMI.  Neck: No  thyromegaly  HEENT:  Normocephalic . Face symmetric, atraumatic Lungs:  CTA B Normal respiratory effort, no intercostal retractions, no accessory muscle use. Heart: RRR,  no murmur.  No pretibial edema bilaterally  Abdomen:  Not distended, soft, non-tender. No rebound or rigidity.   Skin:  Exposed areas without rash. Not pale. Not jaundice Neurologic:  alert & oriented X3.  Speech normal, gait appropriate for age and unassisted Strength symmetric and appropriate for age.  Psych: Cognition and judgment appear intact.  Cooperative with normal attention span and concentration.  Behavior appropriate. No anxious or depressed appearing.     Assessment & Plan:   Assessment DM HTN Hyperlipidemia Morbid obesity (BMI 36 plus DM) Bipolar, Depression ------ see psychiatry, Dr Casimiro Needle DJD-- hydrocodone rarely rx by [place] ortho Asthma-Allergies ------------ Dr [name]  GERD, h/o dysphagia (chronic, neurogenic-transfer dysphagia? See GI note 12-2011) Neuro: --? stroke : saw neuro 2013 d/t B transient visual loss, ASA changed to plavix.  --See CTA, MRIs of head-neck report  --Saw neuro 11-2013: fall, syncope,parkinsonism d/t sapharis? CV: Enlarged ascending Ao per CT 12-2015,CT chest 08-2016 stable recheck q year  Nl LE arterial dopplers 2015 Carotid artery disease: Per ultrasound 2011, last [place] 2018: <50% B, rx medical treatment, recheck 2 years  CT chest  RML  57m: 07-2014, CT 12-2015, CT 08-2016: Stable, no further eval. Enlarged Ascending Ao 3.9 cm: f/u yearly.  Last CT 08-2016.  Stable  MRI chest w/o 10/2017: stable aorta, no further routine x-rays recommended.  RML  nodule is stable as well HOH H/o Mnire's disease   PLAN:  Acute respiratory failure, community-acquired pneumonia: Status post admission, doing better, see HPI.  Check a CBC, BMP. Needs a chest x-ray on RTC. DM: Last A1c 07/18/2018 was 6.4.  CBGs went into the 300 with prednisone but they are coming down.   Continue with Actos plus met. RTC CPX 1 month

## 2018-08-02 NOTE — Assessment & Plan Note (Signed)
Acute respiratory failure, community-acquired pneumonia: Status post admission, doing better, see HPI.  Check a CBC, BMP. Needs a chest x-ray on RTC. DM: Last A1c 07/18/2018 was 6.4.  CBGs went into the 300 with prednisone but they are coming down.   Continue with Actos plus met. RTC CPX 1 month

## 2018-08-14 ENCOUNTER — Ambulatory Visit: Payer: Medicare Other | Admitting: Internal Medicine

## 2018-08-23 DIAGNOSIS — J3089 Other allergic rhinitis: Secondary | ICD-10-CM | POA: Diagnosis not present

## 2018-08-23 DIAGNOSIS — J301 Allergic rhinitis due to pollen: Secondary | ICD-10-CM | POA: Diagnosis not present

## 2018-08-25 ENCOUNTER — Ambulatory Visit: Payer: Medicare Other | Admitting: Gastroenterology

## 2018-08-25 ENCOUNTER — Encounter: Payer: Self-pay | Admitting: Gastroenterology

## 2018-08-25 VITALS — BP 128/70 | HR 92 | Ht 68.0 in | Wt 241.4 lb

## 2018-08-25 DIAGNOSIS — D509 Iron deficiency anemia, unspecified: Secondary | ICD-10-CM | POA: Diagnosis not present

## 2018-08-25 DIAGNOSIS — R195 Other fecal abnormalities: Secondary | ICD-10-CM

## 2018-08-25 NOTE — Progress Notes (Signed)
[facility] Progress Note  Chief Complaint: Iron deficiency anemia  Subjective  History:  Ryan Zhang saw me several months ago for mild iron deficiency anemia, and subsequent stool testing was positive for Hemoccult.  The plan was then for an upper endoscopy and colonoscopy, but he was admitted around Labor Day for pneumonia.  He has recovered from that, and back here along with his wife today to discuss endoscopic work-up.  His hemoglobin decreased some during the acute illness of pneumonia, but is back up to 12.5 when recently checked at primary care hospital follow-up.  As before, he denies visible GI bleeding, chronic abdominal pain or change in bowel habits.  He denies dysphagia, odynophagia, nausea, vomiting or early satiety or weight loss.  ROS: Cardiovascular:  no chest pain Respiratory: no dyspnea is recovering from pneumonia Still has chronic balance problems, and fell last week while trying to adjust his sprinklers.  The patient's Past Medical, Family and Social History were reviewed and are on file in the EMR.  Objective:  Med list reviewed  Current Outpatient Medications:  .  albuterol (PROVENTIL HFA;VENTOLIN HFA) 108 (90 Base) MCG/ACT inhaler, Inhale 2 puffs into the lungs every 6 (six) hours as needed for wheezing or shortness of breath., Disp: 18 g, Rfl: 5 .  albuterol (PROVENTIL) (2.5 MG/3ML) 0.083% nebulizer solution, Take 3 mLs (2.5 mg total) by nebulization every 6 (six) hours as needed for wheezing or shortness of breath., Disp: 180 mL, Rfl: 1 .  asenapine (SAPHRIS) 5 MG SUBL, Place 5 mg under the tongue at bedtime. , Disp: , Rfl:  .  augmented betamethasone dipropionate (DIPROLENE-AF) 0.05 % ointment, Apply topically 2 (two) times daily as needed. (Patient taking differently: Apply 1 application topically 2 (two) times daily as needed (yeast infection). ), Disp: 45 g, Rfl: 2 .  budesonide-formoterol (SYMBICORT) 160-4.5 MCG/ACT inhaler, Inhale 2 puffs into the  lungs 2 (two) times daily. , Disp: , Rfl:  .  clonazePAM (KLONOPIN) 0.5 MG tablet, Take 0.5 mg by mouth 2 (two) times daily. 1 mg in the morning and 0.5 mg in the evening., Disp: , Rfl:  .  clopidogrel (PLAVIX) 75 MG tablet, Take 1 tablet (75 mg total) by mouth daily. (Patient taking differently: Take 75 mg by mouth every evening. ), Disp: 30 tablet, Rfl: 5 .  clotrimazole-betamethasone (LOTRISONE) cream, Apply 1 application topically 2 (two) times daily., Disp: 30 g, Rfl: 2 .  DULoxetine (CYMBALTA) 60 MG capsule, Take 60 mg by mouth every morning. , Disp: , Rfl:  .  EPIPEN 2-PAK 0.3 MG/0.3ML SOAJ injection, Reported on 04/29/2016, Disp: , Rfl:  .  esomeprazole (NEXIUM) 40 MG capsule, Take 80 mg by mouth every morning. , Disp: , Rfl:  .  ferrous sulfate 325 (65 FE) MG tablet, Take 1 tablet (325 mg total) by mouth 2 (two) times daily with a meal., Disp: 60 tablet, Rfl: 6 .  fexofenadine (ALLEGRA) 180 MG tablet, Take 180 mg by mouth every morning. , Disp: , Rfl:  .  fluticasone (VERAMYST) 27.5 MCG/SPRAY nasal spray, Place 2 sprays into the nose daily as needed for rhinitis or allergies. , Disp: , Rfl:  .  glucose blood (ONETOUCH VERIO) test strip, Check blood sugar no more than twice daily, Disp: 100 each, Rfl: 11 .  lamoTRIgine (LAMICTAL) 25 MG tablet, Take 50 mg by mouth at bedtime., Disp: , Rfl:  .  Lancets (ONETOUCH ULTRASOFT) lancets, Check blood sugars no more than twice daily, Disp: 100 each,  Rfl: 12 .  losartan (COZAAR) 25 MG tablet, Take 1 tablet (25 mg total) by mouth daily. (Patient taking differently: Take 25 mg by mouth every evening. ), Disp: 90 tablet, Rfl: 1 .  Misc Natural Products (COMPLETE PROSTATE HEALTH) TABS, Take 1 tablet by mouth every morning., Disp: , Rfl:  .  Multiple Vitamin (MULTIVITAMIN) tablet, Take 1 tablet by mouth daily. , Disp: , Rfl:  .  pioglitazone-metformin (ACTOPLUS MET) 15-850 MG tablet, Take 1 tablet by mouth 2 (two) times daily with a meal., Disp: 180 tablet,  Rfl: 1 .  simvastatin (ZOCOR) 40 MG tablet, Take 1 tablet (40 mg total) by mouth at bedtime., Disp: 90 tablet, Rfl: 1 .  traZODone (DESYREL) 50 MG tablet, Take 50 mg by mouth at bedtime as needed for sleep. , Disp: , Rfl:  .  SUPREP BOWEL PREP KIT 17.5-3.13-1.6 GM/177ML SOLN, Take 1 Bottle by mouth as directed., Disp: , Rfl:    Vital signs in last 24 hrs: Vitals:   08/25/18 1130  BP: 128/70  Pulse: 92    Physical Exam   HEENT: sclera anicteric, oral mucosa moist without lesions.  He has partially healed abrasions right side of his forehead  Neck: supple, no thyromegaly, JVD or lymphadenopathy  Cardiac: RRR without murmurs, S1S2 heard, no peripheral edema  Pulm: clear to auscultation bilaterally, normal RR and effort noted  Abdomen: soft, no tenderness, with active bowel sounds. No guarding or palpable hepatosplenomegaly, limited by body habitus   skin; warm and dry, no jaundice or rash  Recent Labs:  CBC Latest Ref Rng & Units 07/31/2018 07/20/2018 07/19/2018  WBC 4.0 - 10.5 K/uL 8.3 11.1(H) 10.7(H)  Hemoglobin 13.0 - 17.0 g/dL 12.5(L) 10.4(L) 10.8(L)  Hematocrit 39.0 - 52.0 % 38.1(L) 32.1(L) 33.3(L)  Platelets 150.0 - 400.0 K/uL 243.0 215 186   CMP Latest Ref Rng & Units 07/31/2018 07/20/2018 07/19/2018  Glucose 70 - 99 mg/dL 133(H) 302(H) 223(H)  BUN 6 - 23 mg/dL 24(H) 31(H) 20  Creatinine 0.40 - 1.50 mg/dL 0.91 0.87 0.94  Sodium 135 - 145 mEq/L 138 138 139  Potassium 3.5 - 5.1 mEq/L 5.0 3.9 4.8  Chloride 96 - 112 mEq/L 102 105 107  CO2 19 - 32 mEq/L 31 24 25   Calcium 8.4 - 10.5 mg/dL 8.8 8.7(L) 8.5(L)  Total Protein 6.0 - 8.3 g/dL - - -  Total Bilirubin 0.2 - 1.2 mg/dL - - -  Alkaline Phos 39 - 117 U/L - - -  AST 0 - 37 U/L - - -  ALT 0 - 53 U/L - - -     @ASSESSMENTPLANBEGIN @ Assessment: Encounter Diagnoses  Name Primary?  . Iron deficiency anemia, unspecified iron deficiency anemia type Yes  . Heme positive stool    It is still unclear if he has a significant  source of GI blood loss.  1 out of 2 stool tests were positive for occult blood.  Nevertheless, he should have endoscopic work-up to be certain. We again discussed EGD and colonoscopy, and he is agreeable.  The benefits and risks of the planned procedure were described in detail with the patient or (when appropriate) their health care proxy.  Risks were outlined as including, but not limited to, bleeding, infection, perforation, adverse medication reaction leading to cardiac or pulmonary decompensation, or pancreatitis (if ERCP).  The limitation of incomplete mucosal visualization was also discussed.  No guarantees or warranties were given.  Primary care had previously felt it was okay for Roniel to be  off Plavix 5 days before, so we have given him those same instructions.  He will stay on once daily iron tablets at least until his endoscopic testing is complete.  Total time 25 minutes, over half spent face-to-face with patient in counseling and coordination of care.   Nelida Meuse III

## 2018-08-25 NOTE — Patient Instructions (Signed)
If you are age 70 or older, your body mass index should be between 23-30. Your Body mass index is 36.7 kg/m. If this is out of the aforementioned range listed, please consider follow up with your Primary Care Provider.  If you are age 63 or younger, your body mass index should be between 19-25. Your Body mass index is 36.7 kg/m. If this is out of the aformentioned range listed, please consider follow up with your Primary Care Provider.   You have been scheduled for an endoscopy and colonoscopy. Please follow the written instructions given to you at your visit today. Please pick up your prep supplies at the pharmacy within the next 1-3 days. If you use inhalers (even only as needed), please bring them with you on the day of your procedure. Your physician has requested that you go to www.[email or web address].[email or web address] and enter the access code given to you at your visit today. This web site gives a general overview about your procedure. However, you should still follow specific instructions given to you by our office regarding your preparation for the procedure.  It was a pleasure to see you today!  Dr. Myrtie Neither

## 2018-08-28 ENCOUNTER — Encounter: Payer: Self-pay | Admitting: Internal Medicine

## 2018-08-28 ENCOUNTER — Ambulatory Visit (HOSPITAL_BASED_OUTPATIENT_CLINIC_OR_DEPARTMENT_OTHER)
Admission: RE | Admit: 2018-08-28 | Discharge: 2018-08-28 | Disposition: A | Discharge status: Home / Self Care | Payer: Medicare Other | Source: Ambulatory Visit | Attending: Internal Medicine | Admitting: Internal Medicine

## 2018-08-28 ENCOUNTER — Ambulatory Visit (INDEPENDENT_AMBULATORY_CARE_PROVIDER_SITE_OTHER): Payer: Medicare Other | Admitting: Internal Medicine

## 2018-08-28 VITALS — BP 136/74 | HR 87 | Temp 97.6°F | Resp 16 | Ht 69.0 in | Wt 247.0 lb

## 2018-08-28 DIAGNOSIS — J189 Pneumonia, unspecified organism: Secondary | ICD-10-CM | POA: Insufficient documentation

## 2018-08-28 DIAGNOSIS — E785 Hyperlipidemia, unspecified: Secondary | ICD-10-CM | POA: Diagnosis not present

## 2018-08-28 DIAGNOSIS — J301 Allergic rhinitis due to pollen: Secondary | ICD-10-CM | POA: Diagnosis not present

## 2018-08-28 DIAGNOSIS — Z23 Encounter for immunization: Secondary | ICD-10-CM

## 2018-08-28 DIAGNOSIS — J3089 Other allergic rhinitis: Secondary | ICD-10-CM | POA: Diagnosis not present

## 2018-08-28 DIAGNOSIS — E1169 Type 2 diabetes mellitus with other specified complication: Secondary | ICD-10-CM

## 2018-08-28 DIAGNOSIS — Z0000 Encounter for general adult medical examination without abnormal findings: Secondary | ICD-10-CM

## 2018-08-28 DIAGNOSIS — E1142 Type 2 diabetes mellitus with diabetic polyneuropathy: Secondary | ICD-10-CM

## 2018-08-28 NOTE — Assessment & Plan Note (Signed)
-  Td 2011; pnm shot 2015; prevnar 2016; zostavax 2013; s/p shingrex x 2 per pt;   flu shot today -CCS: Normal  Cscope 2011; saw GI , to have a cscope soon -Prostate ca screening: DRE wnl , check a PSA -Diet-exercise : discussed -Fall prevention discussed -lab: FLP, AST, ALT, TSH, PSA

## 2018-08-28 NOTE — Progress Notes (Signed)
Subjective:    Patient ID: Ryan Zhang, male    DOB: January 11, 1946, 72 y.o.   MRN: [id number]  DOS:  08/28/2018 Type of visit - description : cpx Interval history:  Feels well, here for a CPX  Review of Systems Since the last office visit, he had a mechanical fall while doing yard work, no major injuries.  Other than above, a 14 point review of systems is negative     Past Medical History:  Diagnosis Date  . Allergy    allergy shots Dr. Velora Heckler  . Anemia   . Anxiety   . Asthma    moderate persistant  . Bipolar affective ([place])   . Depression   . Diabetes mellitus   . DJD (degenerative joint disease)   . Dysphagia   . Gallstones    hx of, s/p cholecystectomy  . GERD (gastroesophageal reflux disease)   . Hepatitis A    as teenager 78's  . Hollenhorst plaque    right eye  . Hyperlipidemia   . Hypertension   . Kidney stones    hx of  . Meniere's disease   . Motility disorder, esophageal   . Obesity   . Pancreatitis   . Personal history of colonic polyps 11/2004   hyperplastic.  . Stroke [facility])    per MRI    Past Surgical History:  Procedure Laterality Date  . CATARACT EXTRACTION Bilateral 09/2015 and 10/2015  . CHOLECYSTECTOMY    . COLONOSCOPY    . TOTAL KNEE ARTHROPLASTY Bilateral    both knees    Social History   Socioeconomic History  . Marital status: Married    Spouse name: Not on file  . Number of children: 1  . Years of education: 74  . Highest education level: Bachelor's degree (e.g., BA, AB, BS)  Occupational History  . Occupation: disabled    Employer: DISABLED    Comment: laboratory computing/programming  Social Needs  . Financial resource strain: Not hard at all  . Food insecurity:    Worry: Never true    Inability: Never true  . Transportation needs:    Medical: No    Non-medical: No  Tobacco Use  . Smoking status: Former Smoker    Last attempt to quit: 11/15/1964    Years since quitting: 53.8  . Smokeless tobacco: Never Used    Substance and Sexual Activity  . Alcohol use: Not Currently  . Drug use: No  . Sexual activity: Not Currently  Lifestyle  . Physical activity:    Days per week: 0 days    Minutes per session: 0 min  . Stress: Not at all  Relationships  . Social connections:    Talks on phone: More than three times a week    Gets together: More than three times a week    Attends religious service: More than 4 times per year    Active member of club or organization: Yes    Attends meetings of clubs or organizations: 1 to 4 times per year    Relationship status: Married  . Intimate partner violence:    Fear of current or ex partner: No    Emotionally abused: No    Physically abused: No    Forced sexual activity: No  Other Topics Concern  . Not on file  Social History Narrative   Household-- pt , wife, adopted  daughter (h/o substance abuse,on a methadone program, doing better )     Family History  Problem  Relation Age of Onset  . Diabetes Father   . Heart disease Brother   . Heart disease Maternal Grandmother   . Cancer Sister        unknown type  . Colon cancer Neg Hx   . Prostate cancer Neg Hx   . Colon polyps Neg Hx   . Esophageal cancer Neg Hx   . Rectal cancer Neg Hx   . Stomach cancer Neg Hx      Allergies as of 08/28/2018      Reactions   Smz-tmp Ds [sulfamethoxazole-trimethoprim] Nausea And Vomiting   Divalproex Sodium Other (See Comments)   Pancreatitis    Methocarbamol Other (See Comments)   hallucinations   Other    "Symbrax" alteration in blood sugar, pt unsure if low or high blood sugar   Paroxetine Other (See Comments)   Insomnia   Percocet [oxycodone-acetaminophen] Other (See Comments)   Hyperactivity   Risperidone Other (See Comments)   REACTION: hyper   Wellbutrin [bupropion] Other (See Comments)   Hot flashes   Citalopram Hydrobromide Other (See Comments)   Insomnia      Medication List        Accurate as of 08/28/18  8:51 PM. Always use your most  recent med list.          albuterol 108 (90 Base) MCG/ACT inhaler Commonly known as:  PROVENTIL HFA;VENTOLIN HFA Inhale 2 puffs into the lungs every 6 (six) hours as needed for wheezing or shortness of breath.   albuterol (2.5 MG/3ML) 0.083% nebulizer solution Commonly known as:  PROVENTIL Take 3 mLs (2.5 mg total) by nebulization every 6 (six) hours as needed for wheezing or shortness of breath.   augmented betamethasone dipropionate 0.05 % ointment Commonly known as:  DIPROLENE-AF Apply topically 2 (two) times daily as needed.   budesonide-formoterol 160-4.5 MCG/ACT inhaler Commonly known as:  SYMBICORT Inhale 2 puffs into the lungs 2 (two) times daily.   clonazePAM 0.5 MG tablet Commonly known as:  KLONOPIN Take 0.5 mg by mouth 2 (two) times daily. 1 mg in the morning and 0.5 mg in the evening.   clopidogrel 75 MG tablet Commonly known as:  PLAVIX Take 1 tablet (75 mg total) by mouth daily.   clotrimazole-betamethasone cream Commonly known as:  LOTRISONE Apply 1 application topically 2 (two) times daily.   COMPLETE PROSTATE HEALTH Tabs Take 1 tablet by mouth every morning.   CYMBALTA 60 MG capsule Generic drug:  DULoxetine Take 60 mg by mouth every morning.   EPIPEN 2-PAK 0.3 mg/0.3 mL Soaj injection Generic drug:  EPINEPHrine Reported on 04/29/2016   esomeprazole 40 MG capsule Commonly known as:  NEXIUM Take 80 mg by mouth every morning.   ferrous sulfate 325 (65 FE) MG tablet Take 1 tablet (325 mg total) by mouth 2 (two) times daily with a meal.   fexofenadine 180 MG tablet Commonly known as:  ALLEGRA Take 180 mg by mouth every morning.   glucose blood test strip Check blood sugar no more than twice daily   lamoTRIgine 25 MG tablet Commonly known as:  LAMICTAL Take 50 mg by mouth at bedtime.   losartan 25 MG tablet Commonly known as:  COZAAR Take 1 tablet (25 mg total) by mouth daily.   multivitamin tablet Take 1 tablet by mouth daily.     onetouch ultrasoft lancets Check blood sugars no more than twice daily   pioglitazone-metformin 15-850 MG tablet Commonly known as:  ACTOPLUS MET Take 1 tablet by mouth  2 (two) times daily with a meal.   SAPHRIS 5 MG Subl 24 hr tablet Generic drug:  asenapine Place 5 mg under the tongue at bedtime.   simvastatin 40 MG tablet Commonly known as:  ZOCOR Take 1 tablet (40 mg total) by mouth at bedtime.   SUPREP BOWEL PREP KIT 17.5-3.13-1.6 GM/177ML Soln Generic drug:  Na Sulfate-K Sulfate-Mg Sulf Take 1 Bottle by mouth as directed.   traZODone 50 MG tablet Commonly known as:  DESYREL Take 50 mg by mouth at bedtime as needed for sleep.   VERAMYST 27.5 MCG/SPRAY nasal spray Generic drug:  fluticasone Place 2 sprays into the nose daily as needed for rhinitis or allergies.          Objective:   Physical Exam BP 136/74 (BP Location: Left Arm, Patient Position: Sitting, Cuff Size: Small)   Pulse 87   Temp 97.6 F (36.4 C) (Oral)   Resp 16   Ht 5' 9"  (1.753 m)   Wt 247 lb (112 kg)   SpO2 93%   BMI 36.48 kg/m  General: Well developed, NAD, see BMI.  Neck: No  thyromegaly  HEENT:  Normocephalic . Face symmetric, atraumatic Lungs:  CTA B Normal respiratory effort, no intercostal retractions, no accessory muscle use. Heart: RRR,  no murmur.  Trace pretibial edema bilaterally  Abdomen:  Not distended, soft, non-tender. No rebound or rigidity.   Skin: Exposed areas without rash. Not pale. Not jaundice Rectal: External abnormalities: none. Normal sphincter tone. No rectal masses or tenderness.  Brown stools Prostate: Prostate gland firm and smooth, no enlargement, nodularity, tenderness, mass, asymmetry or induration Neurologic:  alert & oriented X3.  Speech normal, gait appropriate for age and unassisted Strength symmetric and appropriate for age.  Psych: Cognition and judgment appear intact.  Cooperative with normal attention span and concentration.  Behavior  appropriate. No anxious or depressed appearing.     Assessment & Plan:   Assessment DM HTN Hyperlipidemia Morbid obesity (BMI 36 plus DM) Bipolar, Depression ------ see psychiatry, Dr Casimiro Needle DJD-- hydrocodone rarely rx by [place] ortho Asthma-Allergies ------------ Dr [name]  GERD, h/o dysphagia (chronic, neurogenic-transfer dysphagia? See GI note 12-2011) Neuro: --? stroke : saw neuro 2013 d/t B transient visual loss, ASA changed to plavix.  --See CTA, MRIs of head-neck report  --Saw neuro 11-2013: fall, syncope,parkinsonism d/t sapharis? CV: Enlarged ascending Ao per CT 12-2015,CT chest 08-2016 stable recheck q year  Nl LE arterial dopplers 2015 Carotid artery disease: Per ultrasound 2011, last [place] 2018: <50% B, rx medical treatment, recheck 2 years  CT chest  RML  20m: 07-2014, CT 12-2015, CT 08-2016: Stable, no further eval. Enlarged Ascending Ao 3.9 cm: f/u yearly.  Last CT 08-2016.  Stable  MRI chest w/o 10/2017: stable aorta, no further routine x-rays recommended.  RML  nodule is stable as well HOH H/o Mnire's disease   PLAN: Here for CPX DM: Controlled on Actos plus met. HTN: Seems well controlled, last BMP satisfactory, continue losartan. High cholesterol: On simvastatin, not fasting, will check a AST, ALT and lipid panel. Pneumonia: Check a follow-up chest x-ray rtc 4 months

## 2018-08-28 NOTE — Patient Instructions (Addendum)
GO TO THE LAB : Get the blood work     GO TO THE FRONT DESK Schedule your next appointment for a  Check up in 4-5 months   Consider a Medicare wellness with one of our nurses   STOP BY THE FIRST FLOOR:  get the XR    Fall Prevention in the Home Falls can cause injuries and can affect people from all age groups. There are many simple things that you can do to make your home safe and to help prevent falls. What can I do on the outside of my home?  Regularly repair the edges of walkways and driveways and fix any cracks.  Remove high doorway thresholds.  Trim any shrubbery on the main path into your home.  Use bright outdoor lighting.  Clear walkways of debris and clutter, including tools and rocks.  Regularly check that handrails are securely fastened and in good repair. Both sides of any steps should have handrails.  Install guardrails along the edges of any raised decks or porches.  Have leaves, snow, and ice cleared regularly.  Use sand or salt on walkways during winter months.  In the garage, clean up any spills right away, including grease or oil spills. What can I do in the bathroom?  Use night lights.  Install grab bars by the toilet and in the tub and shower. Do not use towel bars as grab bars.  Use non-skid mats or decals on the floor of the tub or shower.  If you need to sit down while you are in the shower, use a plastic, non-slip stool.  Keep the floor dry. Immediately clean up any water that spills on the floor.  Remove soap buildup in the tub or shower on a regular basis.  Attach bath mats securely with double-sided non-slip rug tape.  Remove throw rugs and other tripping hazards from the floor. What can I do in the bedroom?  Use night lights.  Make sure that a bedside light is easy to reach.  Do not use oversized bedding that drapes onto the floor.  Have a firm chair that has side arms to use for getting dressed.  Remove throw rugs and other  tripping hazards from the floor. What can I do in the kitchen?  Clean up any spills right away.  Avoid walking on wet floors.  Place frequently used items in easy-to-reach places.  If you need to reach for something above you, use a sturdy step stool that has a grab bar.  Keep electrical cables out of the way.  Do not use floor polish or wax that makes floors slippery. If you have to use wax, make sure that it is non-skid floor wax.  Remove throw rugs and other tripping hazards from the floor. What can I do in the stairways?  Do not leave any items on the stairs.  Make sure that there are handrails on both sides of the stairs. Fix handrails that are broken or loose. Make sure that handrails are as long as the stairways.  Check any carpeting to make sure that it is firmly attached to the stairs. Fix any carpet that is loose or worn.  Avoid having throw rugs at the top or bottom of stairways, or secure the rugs with carpet tape to prevent them from moving.  Make sure that you have a light switch at the top of the stairs and the bottom of the stairs. If you do not have them, have them installed. What  are some other fall prevention tips?  Wear closed-toe shoes that fit well and support your feet. Wear shoes that have rubber soles or low heels.  When you use a stepladder, make sure that it is completely opened and that the sides are firmly locked. Have someone hold the ladder while you are using it. Do not climb a closed stepladder.  Add color or contrast paint or tape to grab bars and handrails in your home. Place contrasting color strips on the first and last steps.  Use mobility aids as needed, such as canes, walkers, scooters, and crutches.  Turn on lights if it is dark. Replace any light bulbs that burn out.  Set up furniture so that there are clear paths. Keep the furniture in the same spot.  Fix any uneven floor surfaces.  Choose a carpet design that does not hide the  edge of steps of a stairway.  Be aware of any and all pets.  Review your medicines with your healthcare provider. Some medicines can cause dizziness or changes in blood pressure, which increase your risk of falling. Talk with your health care provider about other ways that you can decrease your risk of falls. This may include working with a physical therapist or trainer to improve your strength, balance, and endurance. This information is not intended to replace advice given to you by your health care provider. Make sure you discuss any questions you have with your health care provider. Document Released: 10/22/2002 Document Revised: 03/30/2016 Document Reviewed: 12/06/2014 Elsevier Interactive Patient Education  [employer].

## 2018-08-28 NOTE — Assessment & Plan Note (Addendum)
Here for CPX DM: Controlled on Actos plus met. HTN: Seems well controlled, last BMP satisfactory, continue losartan. High cholesterol: On simvastatin, not fasting, will check a AST, ALT and lipid panel. Pneumonia: Check a follow-up chest x-ray rtc 4 months

## 2018-08-28 NOTE — Progress Notes (Signed)
Pre visit review using our clinic review tool, if applicable. No additional management support is needed unless otherwise documented below in the visit note. 

## 2018-08-29 LAB — LIPID PANEL
Cholesterol: 115 mg/dL (ref 0–200)
HDL: 49.6 mg/dL (ref >39.00)
LDL Cholesterol: 52 mg/dL (ref 0–99)
NonHDL: 64.96
Total CHOL/HDL Ratio: 2
Triglycerides: 66 mg/dL (ref 0.0–149.0)
VLDL: 13.2 mg/dL (ref 0.0–40.0)

## 2018-08-29 LAB — AST: AST: 11 U/L (ref 0–37)

## 2018-08-29 LAB — PSA: PSA: 0.58 ng/mL (ref 0.10–4.00)

## 2018-08-29 LAB — ALT: ALT: 10 U/L (ref 0–53)

## 2018-08-29 LAB — TSH: TSH: 0.55 u[IU]/mL (ref 0.35–4.50)

## 2018-08-30 DIAGNOSIS — J301 Allergic rhinitis due to pollen: Secondary | ICD-10-CM | POA: Diagnosis not present

## 2018-08-30 DIAGNOSIS — J3089 Other allergic rhinitis: Secondary | ICD-10-CM | POA: Diagnosis not present

## 2018-09-06 DIAGNOSIS — J301 Allergic rhinitis due to pollen: Secondary | ICD-10-CM | POA: Diagnosis not present

## 2018-09-06 DIAGNOSIS — J3089 Other allergic rhinitis: Secondary | ICD-10-CM | POA: Diagnosis not present

## 2018-09-13 DIAGNOSIS — J3089 Other allergic rhinitis: Secondary | ICD-10-CM | POA: Diagnosis not present

## 2018-09-13 DIAGNOSIS — J301 Allergic rhinitis due to pollen: Secondary | ICD-10-CM | POA: Diagnosis not present

## 2018-09-20 DIAGNOSIS — J301 Allergic rhinitis due to pollen: Secondary | ICD-10-CM | POA: Diagnosis not present

## 2018-09-20 DIAGNOSIS — J3089 Other allergic rhinitis: Secondary | ICD-10-CM | POA: Diagnosis not present

## 2018-09-22 ENCOUNTER — Encounter: Payer: Self-pay | Admitting: Gastroenterology

## 2018-09-24 ENCOUNTER — Other Ambulatory Visit: Payer: Self-pay | Admitting: Internal Medicine

## 2018-09-27 DIAGNOSIS — J3089 Other allergic rhinitis: Secondary | ICD-10-CM | POA: Diagnosis not present

## 2018-09-27 DIAGNOSIS — J301 Allergic rhinitis due to pollen: Secondary | ICD-10-CM | POA: Diagnosis not present

## 2018-09-27 DIAGNOSIS — J4540 Moderate persistent asthma, uncomplicated: Secondary | ICD-10-CM | POA: Diagnosis not present

## 2018-09-27 DIAGNOSIS — J300 Vasomotor rhinitis: Secondary | ICD-10-CM | POA: Diagnosis not present

## 2018-10-04 ENCOUNTER — Encounter: Payer: Medicare Other | Admitting: Gastroenterology

## 2018-10-04 DIAGNOSIS — J301 Allergic rhinitis due to pollen: Secondary | ICD-10-CM | POA: Diagnosis not present

## 2018-10-04 DIAGNOSIS — J3089 Other allergic rhinitis: Secondary | ICD-10-CM | POA: Diagnosis not present

## 2018-10-06 ENCOUNTER — Encounter: Payer: Self-pay | Admitting: Gastroenterology

## 2018-10-06 ENCOUNTER — Ambulatory Visit (AMBULATORY_SURGERY_CENTER): Payer: Medicare Other | Admitting: Gastroenterology

## 2018-10-06 VITALS — BP 138/68 | HR 66 | Temp 97.3°F | Resp 19 | Ht 68.0 in | Wt 241.0 lb

## 2018-10-06 DIAGNOSIS — R195 Other fecal abnormalities: Secondary | ICD-10-CM | POA: Diagnosis not present

## 2018-10-06 DIAGNOSIS — D509 Iron deficiency anemia, unspecified: Secondary | ICD-10-CM | POA: Diagnosis not present

## 2018-10-06 MED ORDER — SODIUM CHLORIDE 0.9 % IV SOLN: 500.0000 mL | Freq: Once | INTRAVENOUS | Status: DC

## 2018-10-06 NOTE — Op Note (Signed)
[facility] Patient Name: Ryan AsalJohn Zhang Procedure Date: 10/06/2018 1:29 PM MRN: [id number] Endoscopist: Ryan Zhang , MD Age: 72 Referring MD:  Date of Birth: [birth year] Gender: Male Account #: [id number] Procedure:                Upper GI endoscopy Indications:              Iron deficiency anemia (hemoglobin 12, MCV normal,                            iron level mildly decreased; Heme positive stool) Medicines:                Monitored Anesthesia Care Procedure:                Pre-Anesthesia Assessment:                           - Prior to the procedure, a History and Physical                            was performed, and patient medications and                            allergies were reviewed. The patient's tolerance of                            previous anesthesia was also reviewed. The risks                            and benefits of the procedure and the sedation                            options and risks were discussed with the patient.                            All questions were answered, and informed consent                            was obtained. Prior Anticoagulants: The patient has                            taken Plavix (clopidogrel), last dose was 5 days                            prior to procedure. ASA Grade Assessment: III - A                            patient with severe systemic disease. After                            reviewing the risks and benefits, the patient was                            deemed in satisfactory condition to undergo the  procedure.                           After obtaining informed consent, the endoscope was                            passed under direct vision. Throughout the                            procedure, the patient's blood pressure, pulse, and                            oxygen saturations were monitored continuously. The                            Endoscope was introduced through the  mouth, and                            advanced to the second part of duodenum. The upper                            GI endoscopy was accomplished without difficulty.                            The patient tolerated the procedure well. Scope In: Scope Out: Findings:                 The esophagus was normal.                           The stomach was normal.                           The cardia and gastric fundus were normal on                            retroflexion.                           The examined duodenum was normal. Complications:            No immediate complications. Estimated Blood Loss:     Estimated blood loss: none. Impression:               - Normal esophagus.                           - Normal stomach.                           - Normal examined duodenum.                           - No specimens collected.                           No cause for heem positive stool seen. Recommendation:           - Patient has a  contact number available for                            emergencies. The signs and symptoms of potential                            delayed complications were discussed with the                            patient. Return to normal activities tomorrow.                            Written discharge instructions were provided to the                            patient.                           - Resume previous diet.                           - Resume Plavix (clopidogrel) at prior dose today.                           - See the other procedure note for documentation of                            additional recommendations. Fleeta Kunde L. Myrtie Neither, MD 10/06/2018 2:07:51 PM This report has been signed electronically.

## 2018-10-06 NOTE — Progress Notes (Signed)
PT taken to PACU. Monitors in place. VSS. Report given to RN. 

## 2018-10-06 NOTE — Op Note (Signed)
[facility] Patient Name: Ryan Zhang Procedure Date: 10/06/2018 1:29 PM MRN: [id number] Endoscopist: Sherilyn Cooter L. Myrtie Neither , MD Age: 72 Referring MD:  Date of Birth: 1946/08/27 Gender: Male Account #: [id number] Procedure:                Colonoscopy Indications:              Heme positive stool, Iron deficiency anemia                            (Hemoglobin 12, normal MCV, iron level mildly                            decreased) Medicines:                Monitored Anesthesia Care Procedure:                Pre-Anesthesia Assessment:                           - Prior to the procedure, a History and Physical                            was performed, and patient medications and                            allergies were reviewed. The patient's tolerance of                            previous anesthesia was also reviewed. The risks                            and benefits of the procedure and the sedation                            options and risks were discussed with the patient.                            All questions were answered, and informed consent                            was obtained. Prior Anticoagulants: The patient has                            taken Plavix (clopidogrel), last dose was 5 days                            prior to procedure. ASA Grade Assessment: III - A                            patient with severe systemic disease. After                            reviewing the risks and benefits, the patient was  deemed in satisfactory condition to undergo the                            procedure.                           After obtaining informed consent, the colonoscope                            was passed under direct vision. Throughout the                            procedure, the patient's blood pressure, pulse, and                            oxygen saturations were monitored continuously. The                            Colonoscope was  introduced through the anus and                            advanced to the the cecum, identified by                            appendiceal orifice and ileocecal valve. The                            colonoscopy was performed without difficulty. The                            patient tolerated the procedure well. The quality                            of the bowel preparation was good. The ileocecal                            valve, appendiceal orifice, and rectum were                            photographed. Scope In: 1:47:03 PM Scope Out: 1:58:49 PM Scope Withdrawal Time: 0 hours 9 minutes 59 seconds  Total Procedure Duration: 0 hours 11 minutes 46 seconds  Findings:                 The perianal and digital rectal examinations were                            normal.                           The entire examined colon appeared normal on direct                            and retroflexion views. Complications:            No immediate complications. Estimated Blood Loss:     Estimated  blood loss: none. Impression:               - The entire examined colon is normal on direct and                            retroflexion views.                           - No specimens collected.                           No cause for heme positive stool seen. It is not                            clear that was related to the mild anemia that has                            now resolved. Recommendation:           - Patient has a contact number available for                            emergencies. The signs and symptoms of potential                            delayed complications were discussed with the                            patient. Return to normal activities tomorrow.                            Written discharge instructions were provided to the                            patient.                           - Resume previous diet.                           - Continue present medications.                            - No recommendation at this time regarding repeat                            colonoscopy due to age and no polyps on today's                            exam.                           - Return to primary care physician to follow blood                            counts.                           -  No need for further iron supplementation. Carlo Guevarra L. Myrtie Neitheranis, MD 10/06/2018 2:10:42 PM This report has been signed electronically.

## 2018-10-06 NOTE — Patient Instructions (Signed)
Impression/Recommendations:  Resume previous diet. Continue present medications.  Resume Plavix (clopidogrel) at prior dose today.  Return to primary care physician to follow blood counts.  YOU HAD AN ENDOSCOPIC PROCEDURE TODAY AT THE [facility]:   Refer to the procedure report that was given to you for any specific questions about what was found during the examination.  If the procedure report does not answer your questions, please call your gastroenterologist to clarify.  If you requested that your care partner not be given the details of your procedure findings, then the procedure report has been included in a sealed envelope for you to review at your convenience later.  YOU SHOULD EXPECT: Some feelings of bloating in the abdomen. Passage of more gas than usual.  Walking can help get rid of the air that was put into your GI tract during the procedure and reduce the bloating. If you had a lower endoscopy (such as a colonoscopy or flexible sigmoidoscopy) you may notice spotting of blood in your stool or on the toilet paper. If you underwent a bowel prep for your procedure, you may not have a normal bowel movement for a few days.  Please Note:  You might notice some irritation and congestion in your nose or some drainage.  This is from the oxygen used during your procedure.  There is no need for concern and it should clear up in a day or so.  SYMPTOMS TO REPORT IMMEDIATELY:   Following lower endoscopy (colonoscopy or flexible sigmoidoscopy):  Excessive amounts of blood in the stool  Significant tenderness or worsening of abdominal pains  Swelling of the abdomen that is new, acute  Fever of 100F or higher   Following upper endoscopy (EGD)  Vomiting of blood or coffee ground material  New chest pain or pain under the shoulder blades  Painful or persistently difficult swallowing  New shortness of breath  Fever of 100F or higher  Black, tarry-looking stools  For urgent or  emergent issues, a gastroenterologist can be reached at any hour by calling [phone].   DIET:  We do recommend a small meal at first, but then you may proceed to your regular diet.  Drink plenty of fluids but you should avoid alcoholic beverages for 24 hours.  ACTIVITY:  You should plan to take it easy for the rest of today and you should NOT DRIVE or use heavy machinery until tomorrow (because of the sedation medicines used during the test).    FOLLOW UP: Our staff will call the number listed on your records the next business day following your procedure to check on you and address any questions or concerns that you may have regarding the information given to you following your procedure. If we do not reach you, we will leave a message.  However, if you are feeling well and you are not experiencing any problems, there is no need to return our call.  We will assume that you have returned to your regular daily activities without incident.  If any biopsies were taken you will be contacted by phone or by letter within the next 1-3 weeks.  Please call us at [phone] if you have not heard about the biopsies in 3 weeks.    SIGNATURES/CONFIDENTIALITY: You and/or your care partner have signed paperwork which will be entered into your electronic medical record.  These signatures attest to the fact that that the information above on your After Visit Summary has been reviewed and is understood.  Full responsibility of the confidentiality of this discharge information lies with you and/or your care-partner.

## 2018-10-09 ENCOUNTER — Telehealth: Payer: Self-pay | Admitting: *Deleted

## 2018-10-09 DIAGNOSIS — J301 Allergic rhinitis due to pollen: Secondary | ICD-10-CM | POA: Diagnosis not present

## 2018-10-09 DIAGNOSIS — J3089 Other allergic rhinitis: Secondary | ICD-10-CM | POA: Diagnosis not present

## 2018-10-09 NOTE — Telephone Encounter (Signed)
  Follow up Call-  Call back number 10/06/2018  Post procedure Call Back phone  # [phone]  Permission to leave phone message Yes  Some recent data might be hidden     Patient questions:  Do you have a fever, pain , or abdominal swelling? No. Pain Score  0 *  Have you tolerated food without any problems? Yes.    Have you been able to return to your normal activities? Yes.    Do you have any questions about your discharge instructions: Diet   No. Medications  No. Follow up visit  No.  Do you have questions or concerns about your Care? No.  Actions: * If pain score is 4 or above: No action needed, pain <4.

## 2018-10-11 DIAGNOSIS — J3089 Other allergic rhinitis: Secondary | ICD-10-CM | POA: Diagnosis not present

## 2018-10-11 DIAGNOSIS — J301 Allergic rhinitis due to pollen: Secondary | ICD-10-CM | POA: Diagnosis not present

## 2018-10-16 ENCOUNTER — Other Ambulatory Visit: Payer: Self-pay | Admitting: Internal Medicine

## 2018-10-16 DIAGNOSIS — J3089 Other allergic rhinitis: Secondary | ICD-10-CM | POA: Diagnosis not present

## 2018-10-16 DIAGNOSIS — J301 Allergic rhinitis due to pollen: Secondary | ICD-10-CM | POA: Diagnosis not present

## 2018-10-18 DIAGNOSIS — J301 Allergic rhinitis due to pollen: Secondary | ICD-10-CM | POA: Diagnosis not present

## 2018-10-18 DIAGNOSIS — J3089 Other allergic rhinitis: Secondary | ICD-10-CM | POA: Diagnosis not present

## 2018-10-19 DIAGNOSIS — K1370 Unspecified lesions of oral mucosa: Secondary | ICD-10-CM | POA: Diagnosis not present

## 2018-10-25 DIAGNOSIS — J3089 Other allergic rhinitis: Secondary | ICD-10-CM | POA: Diagnosis not present

## 2018-10-25 DIAGNOSIS — J301 Allergic rhinitis due to pollen: Secondary | ICD-10-CM | POA: Diagnosis not present

## 2018-10-28 ENCOUNTER — Other Ambulatory Visit: Payer: Self-pay | Admitting: Internal Medicine

## 2018-11-01 DIAGNOSIS — J301 Allergic rhinitis due to pollen: Secondary | ICD-10-CM | POA: Diagnosis not present

## 2018-11-01 DIAGNOSIS — J3089 Other allergic rhinitis: Secondary | ICD-10-CM | POA: Diagnosis not present

## 2018-11-07 DIAGNOSIS — J301 Allergic rhinitis due to pollen: Secondary | ICD-10-CM | POA: Diagnosis not present

## 2018-11-07 DIAGNOSIS — J3089 Other allergic rhinitis: Secondary | ICD-10-CM | POA: Diagnosis not present

## 2018-11-14 DIAGNOSIS — J301 Allergic rhinitis due to pollen: Secondary | ICD-10-CM | POA: Diagnosis not present

## 2018-11-19 ENCOUNTER — Other Ambulatory Visit: Payer: Self-pay | Admitting: Internal Medicine

## 2018-11-22 DIAGNOSIS — J301 Allergic rhinitis due to pollen: Secondary | ICD-10-CM | POA: Diagnosis not present

## 2018-11-22 DIAGNOSIS — J3089 Other allergic rhinitis: Secondary | ICD-10-CM | POA: Diagnosis not present

## 2018-11-29 DIAGNOSIS — J301 Allergic rhinitis due to pollen: Secondary | ICD-10-CM | POA: Diagnosis not present

## 2018-11-29 DIAGNOSIS — J3089 Other allergic rhinitis: Secondary | ICD-10-CM | POA: Diagnosis not present

## 2018-12-06 DIAGNOSIS — J3089 Other allergic rhinitis: Secondary | ICD-10-CM | POA: Diagnosis not present

## 2018-12-06 DIAGNOSIS — J301 Allergic rhinitis due to pollen: Secondary | ICD-10-CM | POA: Diagnosis not present

## 2018-12-07 IMAGING — DX DG CHEST 2V
2 series · 2 of 2 positions shown · non-contrast
Comparison: 07/18/2018

CLINICAL DATA: Followup community acquired pneumonia.

EXAM:
CHEST - 2 VIEW

[chest pa]
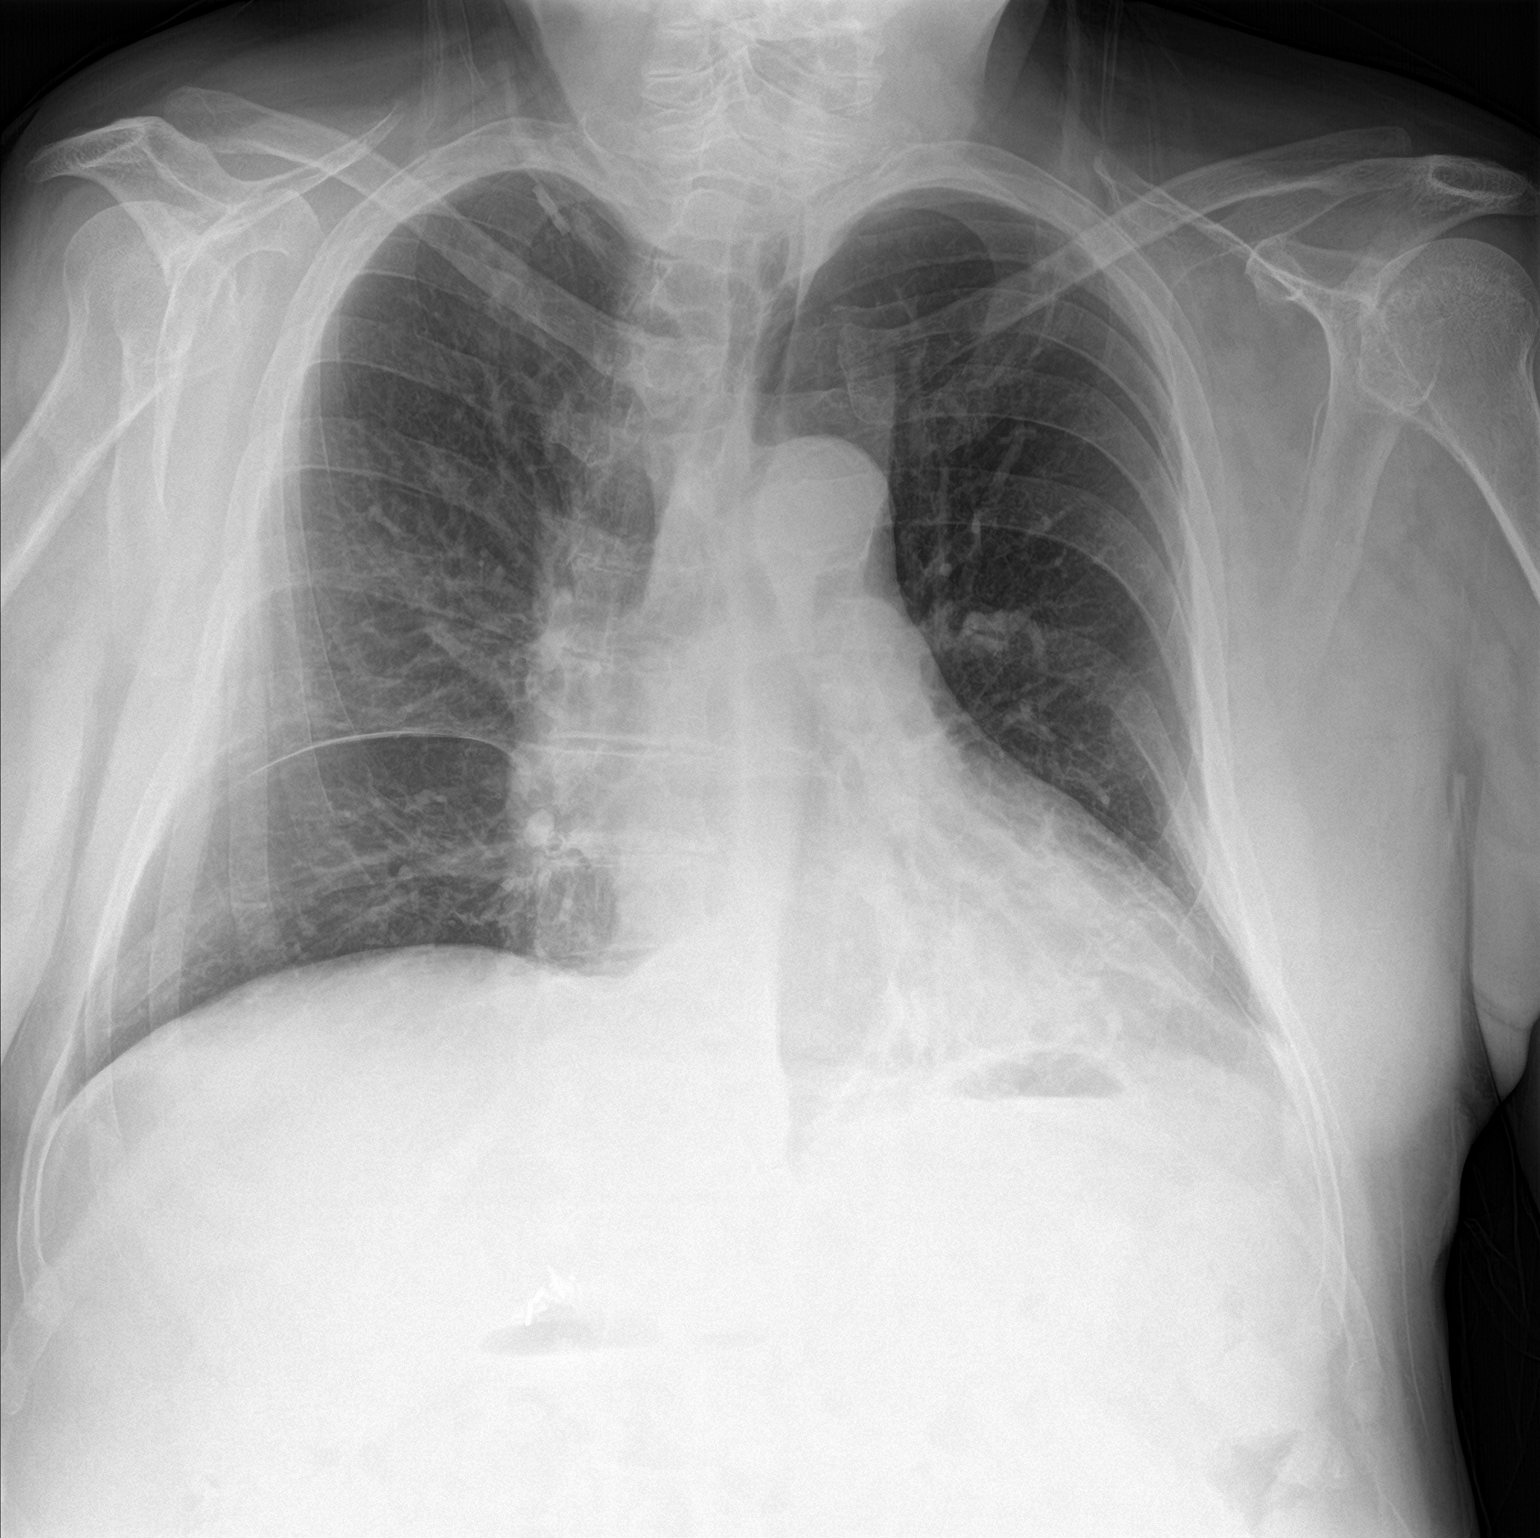

[chest lat]
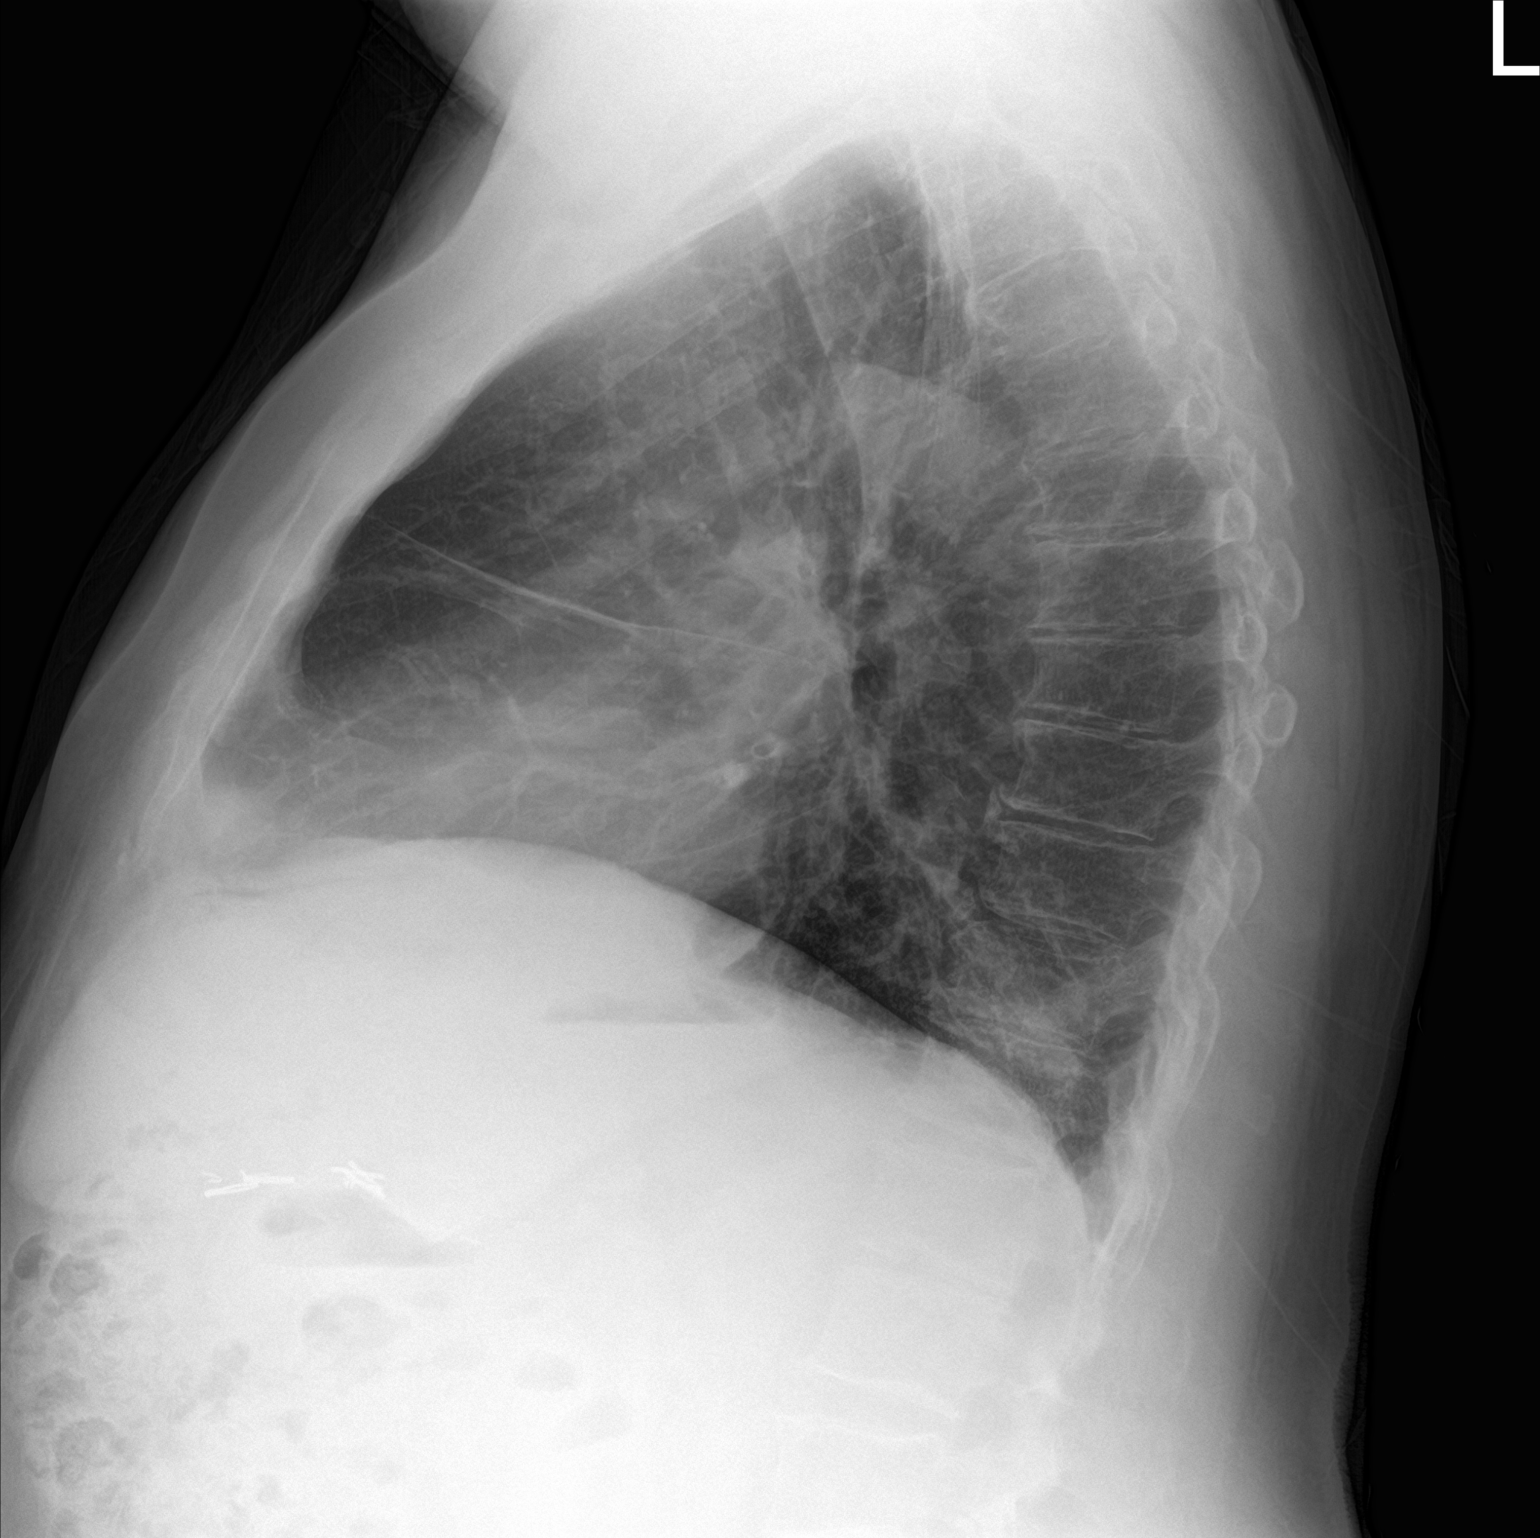

[2 of 2 positions shown; findings below may reference images not displayed]

FINDINGS: The heart size and mediastinal contours are within normal limits.
Aortic atherosclerosis. Patient is rotated to the left. There has
been interval improvement in left lower lobe infiltrate since
previous study. Right lung remains clear. No evidence of pleural
effusion.
IMPRESSION: Interval improvement in left lower lobe infiltrate since previous
study. No new or worsening disease.

## 2018-12-15 DIAGNOSIS — J300 Vasomotor rhinitis: Secondary | ICD-10-CM | POA: Diagnosis not present

## 2018-12-15 DIAGNOSIS — J4540 Moderate persistent asthma, uncomplicated: Secondary | ICD-10-CM | POA: Diagnosis not present

## 2018-12-15 DIAGNOSIS — J3089 Other allergic rhinitis: Secondary | ICD-10-CM | POA: Diagnosis not present

## 2018-12-20 DIAGNOSIS — J3089 Other allergic rhinitis: Secondary | ICD-10-CM | POA: Diagnosis not present

## 2018-12-20 DIAGNOSIS — J301 Allergic rhinitis due to pollen: Secondary | ICD-10-CM | POA: Diagnosis not present

## 2018-12-27 DIAGNOSIS — J3089 Other allergic rhinitis: Secondary | ICD-10-CM | POA: Diagnosis not present

## 2018-12-27 DIAGNOSIS — J301 Allergic rhinitis due to pollen: Secondary | ICD-10-CM | POA: Diagnosis not present

## 2019-01-01 ENCOUNTER — Ambulatory Visit (HOSPITAL_BASED_OUTPATIENT_CLINIC_OR_DEPARTMENT_OTHER)
Admission: RE | Admit: 2019-01-01 | Discharge: 2019-01-01 | Disposition: A | Discharge status: Home / Self Care | Payer: Medicare Other | Source: Ambulatory Visit | Attending: Internal Medicine | Admitting: Internal Medicine

## 2019-01-01 ENCOUNTER — Ambulatory Visit (INDEPENDENT_AMBULATORY_CARE_PROVIDER_SITE_OTHER): Payer: Medicare Other | Admitting: Internal Medicine

## 2019-01-01 ENCOUNTER — Encounter: Payer: Self-pay | Admitting: Internal Medicine

## 2019-01-01 VITALS — BP 132/78 | HR 79 | Temp 97.8°F | Resp 18 | Ht 68.0 in | Wt 260.0 lb

## 2019-01-01 DIAGNOSIS — R0602 Shortness of breath: Secondary | ICD-10-CM | POA: Diagnosis not present

## 2019-01-01 DIAGNOSIS — I10 Essential (primary) hypertension: Secondary | ICD-10-CM | POA: Diagnosis not present

## 2019-01-01 DIAGNOSIS — E1142 Type 2 diabetes mellitus with diabetic polyneuropathy: Secondary | ICD-10-CM

## 2019-01-01 DIAGNOSIS — R918 Other nonspecific abnormal finding of lung field: Secondary | ICD-10-CM | POA: Diagnosis not present

## 2019-01-01 LAB — CBC WITH DIFFERENTIAL/PLATELET
Basophils Absolute: 0.1 10*3/uL (ref 0.0–0.1)
Basophils Relative: 0.7 % (ref 0.0–3.0)
Eosinophils Absolute: 0.4 10*3/uL (ref 0.0–0.7)
Eosinophils Relative: 5.1 % — ABNORMAL HIGH (ref 0.0–5.0)
HCT: 38.8 % — ABNORMAL LOW (ref 39.0–52.0)
Hemoglobin: 12.4 g/dL — ABNORMAL LOW (ref 13.0–17.0)
Lymphocytes Relative: 28.3 % (ref 12.0–46.0)
Lymphs Abs: 2.3 10*3/uL (ref 0.7–4.0)
MCHC: 32 g/dL (ref 30.0–36.0)
MCV: 85.4 fl (ref 78.0–100.0)
Monocytes Absolute: 0.7 10*3/uL (ref 0.1–1.0)
Monocytes Relative: 8.1 % (ref 3.0–12.0)
Neutro Abs: 4.7 10*3/uL (ref 1.4–7.7)
Neutrophils Relative %: 57.8 % (ref 43.0–77.0)
Platelets: 281 10*3/uL (ref 150.0–400.0)
RBC: 4.54 Mil/uL (ref 4.22–5.81)
RDW: 15.4 % (ref 11.5–15.5)
WBC: 8.2 10*3/uL (ref 4.0–10.5)

## 2019-01-01 LAB — HEMOGLOBIN A1C: Hgb A1c MFr Bld: 6.5 % (ref 4.6–6.5)

## 2019-01-01 LAB — BASIC METABOLIC PANEL
BUN: 35 mg/dL — ABNORMAL HIGH (ref 6–23)
CO2: 27 mEq/L (ref 19–32)
Calcium: 9.1 mg/dL (ref 8.4–10.5)
Chloride: 103 mEq/L (ref 96–112)
Creatinine, Ser: 0.95 mg/dL (ref 0.40–1.50)
GFR: 78.24 mL/min (ref >60.00)
Glucose, Bld: 153 mg/dL — ABNORMAL HIGH (ref 70–99)
Potassium: 4.7 mEq/L (ref 3.5–5.1)
Sodium: 138 mEq/L (ref 135–145)

## 2019-01-01 MED ORDER — DICLOFENAC SODIUM 1 % TD GEL: 4.0000 g | Freq: Four times a day (QID) | TRANSDERMAL | 3 refills | Status: DC | PRN

## 2019-01-01 NOTE — Progress Notes (Signed)
Subjective:    Patient ID: Ryan Zhang, male    DOB: 1945-12-06, 73 y.o.   MRN: [id number]  DOS:  01/01/2019 Type of visit - description: rov In general feeling well, he does have a couple concerns DJD: Complains of pain at the R>L base of the great toes.  Is worse at night, not described as burning, denies paresthesias.  Is simply a sharp pain.  Not necessarily worse with ambulation. He tried naproxen and it caused excessive gum bleeding and is afraid to try CBD oil. He also hurts at the shoulders, worse on the right. DM: He "fell off the wagon", not eating healthy, he is getting back into a more healthy lifestyle. Also concerned about DOE: Started after he had pneumonia and was in the hospital, has not gotten back to baseline.   Wt Readings from Last 3 Encounters:  01/01/19 260 lb (117.9 kg)  10/06/18 241 lb (109.3 kg)  08/28/18 247 lb (112 kg)      Review of Systems Denies chest pain, occasionally has lower extremity edema. Occasional wheezing, chronic cough at baseline.   Past Medical History:  Diagnosis Date  . Allergy    allergy shots Dr. Velora Heckler  . Anemia   . Anxiety   . Asthma    moderate persistant  . Bipolar affective ([place])   . Cataract    both eyes  . COPD (chronic obstructive pulmonary disease) ([place])   . Depression   . Diabetes mellitus   . DJD (degenerative joint disease)   . Dysphagia   . Gallstones    hx of, s/p cholecystectomy  . GERD (gastroesophageal reflux disease)   . Hepatitis A    as teenager 70's  . Hollenhorst plaque    right eye  . Hyperlipidemia   . Hypertension   . Kidney stones    hx of  . Meniere's disease   . Motility disorder, esophageal   . Obesity   . Pancreatitis   . Personal history of colonic polyps 11/2004   hyperplastic.  . Stroke [facility])    per MRI    Past Surgical History:  Procedure Laterality Date  . CATARACT EXTRACTION Bilateral 09/2015 and 10/2015  . CHOLECYSTECTOMY    . COLONOSCOPY    . TOTAL KNEE  ARTHROPLASTY Bilateral    both knees    Social History   Socioeconomic History  . Marital status: Married    Spouse name: Not on file  . Number of children: 1  . Years of education: 59  . Highest education level: Bachelor's degree (e.g., BA, AB, BS)  Occupational History  . Occupation: disabled    Employer: DISABLED    Comment: laboratory computing/programming  Social Needs  . Financial resource strain: Not hard at all  . Food insecurity:    Worry: Never true    Inability: Never true  . Transportation needs:    Medical: No    Non-medical: No  Tobacco Use  . Smoking status: Former Smoker    Last attempt to quit: 11/15/1964    Years since quitting: 54.1  . Smokeless tobacco: Never Used  Substance and Sexual Activity  . Alcohol use: Not Currently  . Drug use: No  . Sexual activity: Not Currently  Lifestyle  . Physical activity:    Days per week: 0 days    Minutes per session: 0 min  . Stress: Not at all  Relationships  . Social connections:    Talks on phone: More than three times a  week    Gets together: More than three times a week    Attends religious service: More than 4 times per year    Active member of club or organization: Yes    Attends meetings of clubs or organizations: 1 to 4 times per year    Relationship status: Married  . Intimate partner violence:    Fear of current or ex partner: No    Emotionally abused: No    Physically abused: No    Forced sexual activity: No  Other Topics Concern  . Not on file  Social History Narrative   Household-- pt , wife, adopted  daughter (h/o substance abuse,on a methadone program, doing better )      Allergies as of 01/01/2019      Reactions   Smz-tmp Ds [sulfamethoxazole-trimethoprim] Nausea And Vomiting   Divalproex Sodium Other (See Comments)   Pancreatitis    Methocarbamol Other (See Comments)   hallucinations   Other    "Symbrax" alteration in blood sugar, pt unsure if low or high blood sugar   Paroxetine  Other (See Comments)   Insomnia   Percocet [oxycodone-acetaminophen] Other (See Comments)   Hyperactivity   Risperidone Other (See Comments)   REACTION: hyper   Wellbutrin [bupropion] Other (See Comments)   Hot flashes   Citalopram Hydrobromide Other (See Comments)   Insomnia      Medication List       Accurate as of January 01, 2019 11:59 PM. Always use your most recent med list.        albuterol 108 (90 Base) MCG/ACT inhaler Commonly known as:  PROVENTIL HFA;VENTOLIN HFA Inhale 2 puffs into the lungs every 6 (six) hours as needed for wheezing or shortness of breath.   albuterol (2.5 MG/3ML) 0.083% nebulizer solution Commonly known as:  PROVENTIL Take 3 mLs (2.5 mg total) by nebulization every 6 (six) hours as needed for wheezing or shortness of breath.   augmented betamethasone dipropionate 0.05 % ointment Commonly known as:  DIPROLENE-AF Apply topically 2 (two) times daily as needed.   budesonide-formoterol 160-4.5 MCG/ACT inhaler Commonly known as:  SYMBICORT Inhale 2 puffs into the lungs 2 (two) times daily.   clonazePAM 0.5 MG tablet Commonly known as:  KLONOPIN Take 0.5 mg by mouth 2 (two) times daily. 1 mg in the morning and 0.5 mg in the evening.   clopidogrel 75 MG tablet Commonly known as:  PLAVIX Take 1 tablet (75 mg total) by mouth daily.   clotrimazole-betamethasone cream Commonly known as:  LOTRISONE Apply 1 application topically 2 (two) times daily.   CYMBALTA 60 MG capsule Generic drug:  DULoxetine Take 60 mg by mouth every morning.   diclofenac sodium 1 % Gel Commonly known as:  VOLTAREN Apply 4 g topically 4 (four) times daily as needed.   EPIPEN 2-PAK 0.3 mg/0.3 mL Soaj injection Generic drug:  EPINEPHrine Reported on 04/29/2016   esomeprazole 40 MG capsule Commonly known as:  NEXIUM Take 80 mg by mouth every morning.   fexofenadine 180 MG tablet Commonly known as:  ALLEGRA Take 180 mg by mouth every morning.   glucose blood test  strip Commonly known as:  ONETOUCH VERIO Check blood sugar no more than twice daily   lamoTRIgine 25 MG tablet Commonly known as:  LAMICTAL Take 50 mg by mouth at bedtime.   losartan 25 MG tablet Commonly known as:  COZAAR Take 1 tablet (25 mg total) by mouth every evening.   multivitamin tablet Take 1 tablet  by mouth daily.   onetouch ultrasoft lancets Check blood sugars no more than twice daily   pioglitazone-metformin 15-850 MG tablet Commonly known as:  ACTOPLUS MET Take 1 tablet by mouth 2 (two) times daily with a meal.   SAPHRIS 5 MG Subl 24 hr tablet Generic drug:  asenapine Place 5 mg under the tongue at bedtime.   simvastatin 40 MG tablet Commonly known as:  ZOCOR Take 1 tablet (40 mg total) by mouth at bedtime.   traZODone 50 MG tablet Commonly known as:  DESYREL Take 50 mg by mouth at bedtime as needed for sleep.   VERAMYST 27.5 MCG/SPRAY nasal spray Generic drug:  fluticasone Place 2 sprays into the nose daily as needed for rhinitis or allergies.           Objective:   Physical Exam BP 132/78 (BP Location: Left Arm, Patient Position: Sitting, Cuff Size: Normal)   Pulse 79   Temp 97.8 F (36.6 C) (Oral)   Resp 18   Ht 5' 8"  (1.727 m)   Wt 260 lb (117.9 kg)   SpO2 96%   BMI 39.53 kg/m  General:   Well developed, NAD, BMI noted. HEENT:  Normocephalic . Face symmetric, atraumatic Neck: No DJD at 45 degrees Lungs:  CTA B Normal respiratory effort, no intercostal retractions, no accessory muscle use. Heart: RRR,  no murmur.  No pretibial edema bilaterally  Diabetic foot exam: Good pedal pulses, skin is dry, no edema. Pinprick examination is very good Toes w/ findings consistent with DJD. MSK great toes:  DJD type of changes noted but no redness, swelling.  Mildly TTP. Neurologic:  alert & oriented X3.  Speech normal, gait appropriate for age and unassisted Psych--  Cognition and judgment appear intact.  Cooperative with normal attention  span and concentration.  Behavior appropriate. No anxious or depressed appearing.      Assessment     Assessment DM HTN Hyperlipidemia Morbid obesity (BMI 39 plus DM) Bipolar, Depression ------ see psychiatry, Dr Casimiro Needle DJD-- hydrocodone rarely rx by [place] ortho Asthma-Allergies ------------ Dr [name]  GERD, h/o dysphagia (chronic, neurogenic-transfer dysphagia? See GI note 12-2011) Neuro: --? stroke : saw neuro 2013 d/t B transient visual loss, ASA changed to plavix.  --See CTA, MRIs of head-neck report  --Saw neuro 11-2013: fall, syncope,parkinsonism d/t sapharis? CV: Enlarged ascending Ao per CT 12-2015,CT chest 08-2016 stable recheck q year  Nl LE arterial dopplers 2015 Carotid artery disease: Per ultrasound 2011, last [place] 2018: <50% B, rx medical treatment, recheck 2 years  CT chest  RML  83m: 07-2014, CT 12-2015, CT 08-2016: Stable, no further eval. Enlarged Ascending Ao 3.9 cm: f/u yearly.  Last CT 08-2016.  Stable  MRI chest w/o 10/2017: stable aorta, no further routine x-rays recommended.  RML  nodule is stable as well HOH H/o Mnire's disease   PLAN: DM: Currently on Actos plus met, last A1c satisfactory, has gained a significant amount of weight.  Admits to poor diet lately but is making some corrections.  Will check a A1c.  If it is a slightly elevated, he would prefer to work on diet before adding a medication. HTN: Currently on losartan, BP seems to be very good. Check BMP. DOE: sxs started after pneumonia 07-2018.  Denies chest pain.  Asthma is relatively well controlled with rare wheezing.  No volume overload on exam.  EKG today: Incomplete RBBB at baseline.  We will get a CBC and chest x-ray.   Deconditioning?  Recommend gradual  exercise offered PT referral. Consider cards/echo DJD: Most of the pain is at the base of the toes, clinically no gout; rec topical Voltaren judicious use of  Tylenol.  Im not opposed to CBD oil. RTC 3 months

## 2019-01-01 NOTE — Progress Notes (Signed)
Pre visit review using our clinic review tool, if applicable. No additional management support is needed unless otherwise documented below in the visit note. 

## 2019-01-01 NOTE — Patient Instructions (Addendum)
Please schedule Medicare Wellness with Lawanna Kobus.   GO TO THE LAB : Get the blood work     GO TO THE FRONT DESK Schedule your next appointment for a checkup in 3 months  For pain: Voltaren gel as needed Tylenol  500 mg OTC 2 tabs a day every 8 hours as needed for pain   Go back to a healthier diet, start a exercise program

## 2019-01-02 NOTE — Assessment & Plan Note (Signed)
DM: Currently on Actos plus met, last A1c satisfactory, has gained a significant amount of weight.  Admits to poor diet lately but is making some corrections.  Will check a A1c.  If it is a slightly elevated, he would prefer to work on diet before adding a medication. HTN: Currently on losartan, BP seems to be very good. Check BMP. DOE: sxs started after pneumonia 07-2018.  Denies chest pain.  Asthma is relatively well controlled with rare wheezing.  No volume overload on exam.  EKG today: Incomplete RBBB at baseline.  We will get a CBC and chest x-ray.   Deconditioning?  Recommend gradual exercise offered PT referral. Consider cards/echo DJD: Most of the pain is at the base of the toes, clinically no gout; rec topical Voltaren judicious use of  Tylenol.  Im not opposed to CBD oil. RTC 3 months

## 2019-01-03 DIAGNOSIS — J301 Allergic rhinitis due to pollen: Secondary | ICD-10-CM | POA: Diagnosis not present

## 2019-01-03 DIAGNOSIS — J3089 Other allergic rhinitis: Secondary | ICD-10-CM | POA: Diagnosis not present

## 2019-01-10 DIAGNOSIS — J301 Allergic rhinitis due to pollen: Secondary | ICD-10-CM | POA: Diagnosis not present

## 2019-01-10 DIAGNOSIS — J3089 Other allergic rhinitis: Secondary | ICD-10-CM | POA: Diagnosis not present

## 2019-01-17 DIAGNOSIS — J301 Allergic rhinitis due to pollen: Secondary | ICD-10-CM | POA: Diagnosis not present

## 2019-01-17 DIAGNOSIS — J3089 Other allergic rhinitis: Secondary | ICD-10-CM | POA: Diagnosis not present

## 2019-01-24 DIAGNOSIS — J3089 Other allergic rhinitis: Secondary | ICD-10-CM | POA: Diagnosis not present

## 2019-01-24 DIAGNOSIS — J301 Allergic rhinitis due to pollen: Secondary | ICD-10-CM | POA: Diagnosis not present

## 2019-01-31 DIAGNOSIS — J301 Allergic rhinitis due to pollen: Secondary | ICD-10-CM | POA: Diagnosis not present

## 2019-01-31 DIAGNOSIS — J3089 Other allergic rhinitis: Secondary | ICD-10-CM | POA: Diagnosis not present

## 2019-02-06 DIAGNOSIS — J301 Allergic rhinitis due to pollen: Secondary | ICD-10-CM | POA: Diagnosis not present

## 2019-02-06 DIAGNOSIS — J3089 Other allergic rhinitis: Secondary | ICD-10-CM | POA: Diagnosis not present

## 2019-02-07 DIAGNOSIS — J3089 Other allergic rhinitis: Secondary | ICD-10-CM | POA: Diagnosis not present

## 2019-02-07 DIAGNOSIS — J301 Allergic rhinitis due to pollen: Secondary | ICD-10-CM | POA: Diagnosis not present

## 2019-02-12 DIAGNOSIS — Z96653 Presence of artificial knee joint, bilateral: Secondary | ICD-10-CM | POA: Diagnosis not present

## 2019-02-12 DIAGNOSIS — Z471 Aftercare following joint replacement surgery: Secondary | ICD-10-CM | POA: Diagnosis not present

## 2019-02-13 ENCOUNTER — Encounter: Payer: Self-pay | Admitting: *Deleted

## 2019-02-13 ENCOUNTER — Ambulatory Visit (INDEPENDENT_AMBULATORY_CARE_PROVIDER_SITE_OTHER): Payer: Medicare Other | Admitting: *Deleted

## 2019-02-13 ENCOUNTER — Other Ambulatory Visit: Payer: Self-pay

## 2019-02-13 DIAGNOSIS — Z0000 Encounter for general adult medical examination without abnormal findings: Secondary | ICD-10-CM | POA: Diagnosis not present

## 2019-02-13 NOTE — Patient Instructions (Signed)
Please schedule your next medicare wellness visit with me in 1 yr.  Eat heart healthy diet (full of fruits, vegetables, whole grains, lean protein, water--limit salt, fat, and sugar intake) and increase physical activity as tolerated.  Continue doing brain stimulating activities (puzzles, reading, adult coloring books, staying active) to keep memory sharp.    Ryan Zhang , Thank you for taking time to come for your Medicare Wellness Visit. I appreciate your ongoing commitment to your health goals. Please review the following plan we discussed and let me know if I can assist you in the future.   These are the goals we discussed: Goals    . Increase physical activity     Decrease the number of breaks needed when mowing the lawn.    . Prevent Falls       This is a list of the screening recommended for you and due dates:  Health Maintenance  Topic Date Due  . Eye exam for diabetics  04/13/2019  . Hemoglobin A1C  07/02/2019  . Complete foot exam   01/02/2020  . Tetanus Vaccine  12/15/2026  . Flu Shot  Completed  .  Hepatitis C: One time screening is recommended by Center for Disease Control  (CDC) for  adults born from [birth year] through 1963.   Completed  . Pneumonia vaccines  Completed    Health Maintenance After Age 73 After age 32, you are at a higher risk for certain long-term diseases and infections as well as injuries from falls. Falls are a major cause of broken bones and head injuries in people who are older than age 36. Getting regular preventive care can help to keep you healthy and well. Preventive care includes getting regular testing and making lifestyle changes as recommended by your health care provider. Talk with your health care provider about:  Which screenings and tests you should have. A screening is a test that checks for a disease when you have no symptoms.  A diet and exercise plan that is right for you. What should I know about screenings and tests to prevent falls?  Screening and testing are the best ways to find a health problem early. Early diagnosis and treatment give you the best chance of managing medical conditions that are common after age 28. Certain conditions and lifestyle choices may make you more likely to have a fall. Your health care provider may recommend:  Regular vision checks. Poor vision and conditions such as cataracts can make you more likely to have a fall. If you wear glasses, make sure to get your prescription updated if your vision changes.  Medicine review. Work with your health care provider to regularly review all of the medicines you are taking, including over-the-counter medicines. Ask your health care provider about any side effects that may make you more likely to have a fall. Tell your health care provider if any medicines that you take make you feel dizzy or sleepy.  Osteoporosis screening. Osteoporosis is a condition that causes the bones to get weaker. This can make the bones weak and cause them to break more easily.  Blood pressure screening. Blood pressure changes and medicines to control blood pressure can make you feel dizzy.  Strength and balance checks. Your health care provider may recommend certain tests to check your strength and balance while standing, walking, or changing positions.  Foot health exam. Foot pain and numbness, as well as not wearing proper footwear, can make you more likely to have a fall.  Depression screening. You may be more likely to have a fall if you have a fear of falling, feel emotionally low, or feel unable to do activities that you used to do.  Alcohol use screening. Using too much alcohol can affect your balance and may make you more likely to have a fall. What actions can I take to lower my risk of falls? General instructions  Talk with your health care provider about your risks for falling. Tell your health care provider if: ? You fall. Be sure to tell your health care provider  about all falls, even ones that seem minor. ? You feel dizzy, sleepy, or off-balance.  Take over-the-counter and prescription medicines only as told by your health care provider. These include any supplements.  Eat a healthy diet and maintain a healthy weight. A healthy diet includes low-fat dairy products, low-fat (lean) meats, and fiber from whole grains, beans, and lots of fruits and vegetables. Home safety  Remove any tripping hazards, such as rugs, cords, and clutter.  Install safety equipment such as grab bars in bathrooms and safety rails on stairs.  Keep rooms and walkways well-lit. Activity   Follow a regular exercise program to stay fit. This will help you maintain your balance. Ask your health care provider what types of exercise are appropriate for you.  If you need a cane or walker, use it as recommended by your health care provider.  Wear supportive shoes that have nonskid soles. Lifestyle  Do not drink alcohol if your health care provider tells you not to drink.  If you drink alcohol, limit how much you have: ? 0-1 drink a day for women. ? 0-2 drinks a day for men.  Be aware of how much alcohol is in your drink. In the [place] one drink equals one typical bottle of beer (12 oz), one-half glass of wine (5 oz), or one shot of hard liquor (1 oz).  Do not use any products that contain nicotine or tobacco, such as cigarettes and e-cigarettes. If you need help quitting, ask your health care provider. Summary  Having a healthy lifestyle and getting preventive care can help to protect your health and wellness after age 84.  Screening and testing are the best way to find a health problem early and help you avoid having a fall. Early diagnosis and treatment give you the best chance for managing medical conditions that are more common for people who are older than age 38.  Falls are a major cause of broken bones and head injuries in people who are older than age 44. Take  precautions to prevent a fall at home.  Work with your health care provider to learn what changes you can make to improve your health and wellness and to prevent falls. This information is not intended to replace advice given to you by your health care provider. Make sure you discuss any questions you have with your health care provider. Document Released: 09/14/2017 Document Revised: 09/14/2017 Document Reviewed: 09/14/2017 Elsevier Interactive Patient Education  2019 [employer].

## 2019-02-13 NOTE — Progress Notes (Addendum)
I connected with Blaise on 02/13/19 at  3:00 PM EDT by a video enabled telemedicine application and verified that I am speaking with the correct person using two identifiers.   Subjective:   Ryan Zhang is a 73 y.o. male who presents for Medicare Annual/Subsequent preventive examination.  Review of Systems: No ROS.  Medicare Wellness Visit. Additional risk factors are reflected in the social history. Cardiac Risk Factors include: advanced age (>100mn, >>57women);diabetes mellitus;dyslipidemia;male gender;hypertension;obesity (BMI >30kg/m2)(gym closed- covid 19) Sleep patterns: Takes Trazodone as needed. 6-7 hrs.no naps Home Safety/Smoke Alarms: Feels safe in home. Smoke alarms in place.  Lives with wife and daughter in 2 story home. Master on 2nd. Walk in shower.   Male:   CCS-  Last 10/06/18. No recall at time of report due to age.   PSA-  Lab Results  Component Value Date   PSA 0.58 08/28/2018   PSA 1.00 05/09/2017   PSA 0.96 09/25/2015       Objective:    Vitals: uta. Virtual visit/ covid 19 PT REPORTS DOING HIS VITALS DIRECTLY PRIOR TO VIDEO: BP=140/80   HR=80   T=97.5   CBG=125   O2 SAT= 96%  Advanced Directives 02/13/2019 07/18/2018 07/18/2018 07/22/2017 02/09/2017 03/15/2015 07/30/2014  Does Patient Have a Medical Advance Directive? Yes No No Yes Yes No No  Type of [place] - - - [place] - -  Does patient want to make changes to medical advance directive? No - Patient declined - - - - - -  Copy of [place] Chart? Yes - validated most recent copy scanned in chart (See row information) - - - No - copy requested - -  Would patient like information on creating a medical advance directive? - No - Patient declined - - - No - patient declined information No - patient declined information    Tobacco Social History   Tobacco Use  Smoking Status Former Smoker  . Last attempt to  quit: 11/15/1964  . Years since quitting: 54.2  Smokeless Tobacco Never Used     Counseling given: Not Answered   Clinical Intake:     Pain : No/denies pain                 Past Medical History:  Diagnosis Date  . Allergy    allergy shots Dr. LVelora Heckler . Anemia   . Anxiety   . Asthma    moderate persistant  . Bipolar affective ([place]   . Cataract    both eyes  . COPD [place] disease[place]   . Depression   . Diabetes mellitus   . DJD (degenerative joint disease)   . Dysphagia   . Gallstones    hx of, s/p cholecystectomy  . GERD (gastroesophageal reflux disease)   . Hepatitis A    as teenager 89's . Hollenhorst plaque    right eye  . Hyperlipidemia   . Hypertension   . Kidney stones    hx of  . Meniere's disease   . Motility disorder, esophageal   . Obesity   . Pancreatitis   . Personal history of colonic polyps 11/2004   hyperplastic.  . Stroke ([facility]    per MRI   Past Surgical History:  Procedure Laterality Date  . CATARACT EXTRACTION Bilateral 09/2015 and 10/2015  . CHOLECYSTECTOMY    . COLONOSCOPY    . TOTAL KNEE ARTHROPLASTY Bilateral  both knees   Family History  Problem Relation Age of Onset  . Diabetes Father   . Heart disease Brother   . Heart disease Maternal Grandmother   . Cancer Sister        unknown type  . Colon cancer Neg Hx   . Prostate cancer Neg Hx   . Colon polyps Neg Hx   . Esophageal cancer Neg Hx   . Rectal cancer Neg Hx   . Stomach cancer Neg Hx    Social History   Socioeconomic History  . Marital status: Married    Spouse name: Not on file  . Number of children: 1  . Years of education: 23  . Highest education level: Bachelor's degree (e.g., BA, AB, BS)  Occupational History  . Occupation: disabled    Employer: DISABLED    Comment: laboratory computing/programming  Social Needs  . Financial resource strain: Not hard at all  . Food insecurity:    Worry: Never true    Inability:  Never true  . Transportation needs:    Medical: No    Non-medical: No  Tobacco Use  . Smoking status: Former Smoker    Last attempt to quit: 11/15/1964    Years since quitting: 54.2  . Smokeless tobacco: Never Used  Substance and Sexual Activity  . Alcohol use: Not Currently  . Drug use: No  . Sexual activity: Not Currently  Lifestyle  . Physical activity:    Days per week: 0 days    Minutes per session: 0 min  . Stress: Not at all  Relationships  . Social connections:    Talks on phone: More than three times a week    Gets together: More than three times a week    Attends religious service: More than 4 times per year    Active member of club or organization: Yes    Attends meetings of clubs or organizations: 1 to 4 times per year    Relationship status: Married  Other Topics Concern  . Not on file  Social History Narrative   Household-- pt , wife, adopted  daughter (h/o substance abuse,on a methadone program, doing better )    Outpatient Encounter Medications as of 02/13/2019  Medication Sig  . albuterol (PROVENTIL HFA;VENTOLIN HFA) 108 (90 Base) MCG/ACT inhaler Inhale 2 puffs into the lungs every 6 (six) hours as needed for wheezing or shortness of breath.  [name] albuterol (PROVENTIL) (2.5 MG/3ML) 0.083% nebulizer solution Take 3 mLs (2.5 mg total) by nebulization every 6 (six) hours as needed for wheezing or shortness of breath.  [name] asenapine (SAPHRIS) 5 MG SUBL Place 5 mg under the tongue at bedtime.   [name] augmented betamethasone dipropionate (DIPROLENE-AF) 0.05 % ointment Apply topically 2 (two) times daily as needed. (Patient taking differently: Apply 1 application topically 2 (two) times daily as needed (yeast infection). )  . budesonide-formoterol (SYMBICORT) 160-4.5 MCG/ACT inhaler Inhale 2 puffs into the lungs 2 (two) times daily.   . clonazePAM (KLONOPIN) 0.5 MG tablet Take 0.5 mg by mouth 2 (two) times daily. 1 mg in the morning and 0.5 mg in the evening.  . clopidogrel  (PLAVIX) 75 MG tablet Take 1 tablet (75 mg total) by mouth daily.  . clotrimazole-betamethasone (LOTRISONE) cream Apply 1 application topically 2 (two) times daily.  . diclofenac sodium (VOLTAREN) 1 % GEL Apply 4 g topically 4 (four) times daily as needed.  . DULoxetine (CYMBALTA) 60 MG capsule Take 60 mg by mouth every morning.   [name]  EPIPEN 2-PAK 0.3 MG/0.3ML SOAJ injection Reported on 04/29/2016  . esomeprazole (NEXIUM) 40 MG capsule Take 80 mg by mouth every morning.   . fexofenadine (ALLEGRA) 180 MG tablet Take 180 mg by mouth every morning.   . fluticasone (VERAMYST) 27.5 MCG/SPRAY nasal spray Place 2 sprays into the nose daily as needed for rhinitis or allergies.   [name] glucose blood (ONETOUCH VERIO) test strip Check blood sugar no more than twice daily  . lamoTRIgine (LAMICTAL) 25 MG tablet Take 50 mg by mouth at bedtime.  . Lancets (ONETOUCH ULTRASOFT) lancets Check blood sugars no more than twice daily  . losartan (COZAAR) 25 MG tablet Take 1 tablet (25 mg total) by mouth every evening.  . Multiple Vitamin (MULTIVITAMIN) tablet Take 1 tablet by mouth daily.   . pioglitazone-metformin (ACTOPLUS MET) 15-850 MG tablet Take 1 tablet by mouth 2 (two) times daily with a meal.  . simvastatin (ZOCOR) 40 MG tablet Take 1 tablet (40 mg total) by mouth at bedtime.  . traZODone (DESYREL) 50 MG tablet Take 50 mg by mouth at bedtime as needed for sleep.    No facility-administered encounter medications on file as of 02/13/2019.     Activities of Daily Living In your present state of health, do you have any difficulty performing the following activities: 02/13/2019 07/18/2018  Hearing? 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 N N  Difficulty concentrating or making decisions? Y N  Walking or climbing stairs? N Y  Comment - secondary to shortness of breath  Dressing or bathing? N N  Doing errands, shopping? N N  Preparing Food and eating ? N -  Using the  Toilet? N -  In the past six months, have you accidently leaked urine? N -  Do you have problems with loss of bowel control? N -  Managing your Medications? N -  Managing your Finances? N -  Housekeeping or managing your Housekeeping? N -  Some recent data might be hidden    Patient Care Team: Colon Branch, MD as PCP - General (Internal Medicine) [name], Darrick Grinder, MD as Consulting Physician (Allergy and Immunology) Norma Fredrickson, MD as Consulting Physician (Psychiatry) Calvert Cantor, MD as Consulting Physician (Ophthalmology) Gean Birchwood, DPM as Consulting Physician (Podiatry)   Assessment:   This is a routine wellness examination for Ryan Zhang. Physical assessment deferred to PCP.  Exercise Activities and Dietary recommendations Current Exercise Habits: The patient does not participate in regular exercise at present, Exercise limited by: None identified Diet (meal preparation, eat out, water intake, caffeinated beverages, dairy products, fruits and vegetables): in general, an "unhealthy" diet, on average, 2 meals per day   Goals    . Increase physical activity     Decrease the number of breaks needed when mowing the lawn.    . Prevent Falls       Fall Risk Fall Risk  02/13/2019 07/31/2018 02/09/2017 09/29/2016 04/29/2016  Falls in the past year? 1 No Yes No No  Number falls in past yr: 1 - 2 or more - -  Injury with Fall? 0 - - - -  Risk Factor Category  - - - - -  Risk for fall due to : Impaired balance/gait - Impaired balance/gait;History of fall(s) - -  Risk for fall due to: Comment - - - - -  Follow up - - Falls prevention discussed - -    Depression Screen [facility] 2/9  Scores 02/13/2019 07/31/2018 07/18/2018 02/09/2017  PHQ - 2 Score 0 0 0 1  Exception Documentation - - - -  Not completed - - - -    Cognitive Function  MMSE - Mini Mental State Exam 02/13/2019 02/09/2017  Not completed: Unable to complete -  Orientation to time - 5  Orientation to Place - 5   Registration - 3  Attention/ Calculation - 5  Recall - 3  Language- name 2 objects - 2  Language- repeat - 1  Language- follow 3 step command - 3  Language- read & follow direction - 1  Write a sentence - 1  Copy design - 1  Total score - 30        Immunization History  Administered Date(s) Administered  . H1N1 11/14/2008  . Influenza Split 08/12/2011, 08/03/2012  . Influenza Whole 08/22/2009, 08/17/2010  . Influenza, High Dose Seasonal PF 08/28/2018  . Influenza,inj,Quad PF,6+ Mos 09/25/2013, 10/14/2014, 09/25/2015  . Influenza-Unspecified 08/04/2016, 07/22/2017  . Pneumococcal Conjugate-13 02/12/2015  . Pneumococcal Polysaccharide-23 11/15/2008, 12/27/2013  . Td 08/22/2009, 07/20/2010, 12/15/2016  . Zoster 04/20/2012  . Zoster Recombinat (Shingrix) 03/16/2017, 09/15/2017   Screening Tests Health Maintenance  Topic Date Due  . OPHTHALMOLOGY EXAM  04/13/2019  . HEMOGLOBIN A1C  07/02/2019  . FOOT EXAM  01/02/2020  . TETANUS/TDAP  12/15/2026  . INFLUENZA VACCINE  Completed  . Hepatitis C Screening  Completed  . PNA vac Low Risk Adult  Completed       Plan:    Please schedule your next medicare wellness visit with me in 1 yr.  Eat heart healthy diet (full of fruits, vegetables, whole grains, lean protein, water--limit salt, fat, and sugar intake) and increase physical activity as tolerated.  Continue doing brain stimulating activities (puzzles, reading, adult coloring books, staying active) to keep memory sharp.    I have personally reviewed and noted the following in the patient's chart:   . Medical and social history . Use of alcohol, tobacco or illicit drugs  . Current medications and supplements . Functional ability and status . Nutritional status . Physical activity . Advanced directives . List of other physicians . Hospitalizations, surgeries, and ER visits in previous 12 months . Vitals . Screenings to include cognitive, depression, and falls .  Referrals and appointments  In addition, I have reviewed and discussed with patient certain preventive protocols, quality metrics, and best practice recommendations. A written personalized care plan for preventive services as well as general preventive health recommendations were provided to patient.     Naaman Plummer Moy[place], [place]  02/13/2019  Kathlene November, MD

## 2019-02-14 DIAGNOSIS — J301 Allergic rhinitis due to pollen: Secondary | ICD-10-CM | POA: Diagnosis not present

## 2019-02-14 DIAGNOSIS — J3089 Other allergic rhinitis: Secondary | ICD-10-CM | POA: Diagnosis not present

## 2019-02-21 DIAGNOSIS — J3089 Other allergic rhinitis: Secondary | ICD-10-CM | POA: Diagnosis not present

## 2019-02-21 DIAGNOSIS — J301 Allergic rhinitis due to pollen: Secondary | ICD-10-CM | POA: Diagnosis not present

## 2019-02-28 DIAGNOSIS — J3089 Other allergic rhinitis: Secondary | ICD-10-CM | POA: Diagnosis not present

## 2019-02-28 DIAGNOSIS — J301 Allergic rhinitis due to pollen: Secondary | ICD-10-CM | POA: Diagnosis not present

## 2019-03-07 DIAGNOSIS — J301 Allergic rhinitis due to pollen: Secondary | ICD-10-CM | POA: Diagnosis not present

## 2019-03-07 DIAGNOSIS — J3089 Other allergic rhinitis: Secondary | ICD-10-CM | POA: Diagnosis not present

## 2019-03-09 DIAGNOSIS — J301 Allergic rhinitis due to pollen: Secondary | ICD-10-CM | POA: Diagnosis not present

## 2019-03-09 DIAGNOSIS — J3089 Other allergic rhinitis: Secondary | ICD-10-CM | POA: Diagnosis not present

## 2019-03-14 DIAGNOSIS — J301 Allergic rhinitis due to pollen: Secondary | ICD-10-CM | POA: Diagnosis not present

## 2019-03-14 DIAGNOSIS — J3089 Other allergic rhinitis: Secondary | ICD-10-CM | POA: Diagnosis not present

## 2019-03-16 DIAGNOSIS — J3089 Other allergic rhinitis: Secondary | ICD-10-CM | POA: Diagnosis not present

## 2019-03-16 DIAGNOSIS — J301 Allergic rhinitis due to pollen: Secondary | ICD-10-CM | POA: Diagnosis not present

## 2019-03-21 DIAGNOSIS — J3089 Other allergic rhinitis: Secondary | ICD-10-CM | POA: Diagnosis not present

## 2019-03-21 DIAGNOSIS — J301 Allergic rhinitis due to pollen: Secondary | ICD-10-CM | POA: Diagnosis not present

## 2019-03-27 ENCOUNTER — Other Ambulatory Visit: Payer: Self-pay

## 2019-03-27 NOTE — Patient Outreach (Signed)
[facility]) Care Management  03/27/2019  Ryan Zhang [birth year] [id number]   Medication Adherence call to Mr. Ryan Zhang spoke with patient he is due on Losartan 25 mg and Simvastatin 40 mg patient did not want to engage he said he takes care of his medication. Ryan Zhang is showing past due under [employer].   Lillia Abed CPhT Pharmacy Technician [facility] Management Direct Dial [phone]  Fax [phone] [email or web address]

## 2019-03-28 DIAGNOSIS — J3089 Other allergic rhinitis: Secondary | ICD-10-CM | POA: Diagnosis not present

## 2019-03-28 DIAGNOSIS — J301 Allergic rhinitis due to pollen: Secondary | ICD-10-CM | POA: Diagnosis not present

## 2019-04-04 DIAGNOSIS — J301 Allergic rhinitis due to pollen: Secondary | ICD-10-CM | POA: Diagnosis not present

## 2019-04-04 DIAGNOSIS — J3089 Other allergic rhinitis: Secondary | ICD-10-CM | POA: Diagnosis not present

## 2019-04-10 ENCOUNTER — Encounter: Payer: Self-pay | Admitting: Internal Medicine

## 2019-04-10 ENCOUNTER — Ambulatory Visit (INDEPENDENT_AMBULATORY_CARE_PROVIDER_SITE_OTHER): Payer: Medicare Other | Admitting: Internal Medicine

## 2019-04-10 ENCOUNTER — Other Ambulatory Visit: Payer: Self-pay

## 2019-04-10 VITALS — BP 158/83 | HR 85 | Temp 97.3°F | Ht 68.0 in | Wt 259.3 lb

## 2019-04-10 DIAGNOSIS — I10 Essential (primary) hypertension: Secondary | ICD-10-CM | POA: Diagnosis not present

## 2019-04-10 DIAGNOSIS — E1169 Type 2 diabetes mellitus with other specified complication: Secondary | ICD-10-CM | POA: Diagnosis not present

## 2019-04-10 DIAGNOSIS — E1142 Type 2 diabetes mellitus with diabetic polyneuropathy: Secondary | ICD-10-CM

## 2019-04-10 DIAGNOSIS — R0602 Shortness of breath: Secondary | ICD-10-CM

## 2019-04-10 DIAGNOSIS — E785 Hyperlipidemia, unspecified: Secondary | ICD-10-CM

## 2019-04-10 DIAGNOSIS — F3131 Bipolar disorder, current episode depressed, mild: Secondary | ICD-10-CM

## 2019-04-10 NOTE — Progress Notes (Signed)
Subjective:    Patient ID: Ryan Zhang, male    DOB: [birth year], 73 y.o.   MRN: [id number]  DOS:  04/10/2019 Type of visit - description: Virtual Visit via Video Note  I connected with@ on 04/11/19 at  8:40 AM EDT by a video enabled telemedicine application and verified that I am speaking with the correct person using two identifiers.   THIS ENCOUNTER IS A VIRTUAL VISIT DUE TO COVID-19 - PATIENT WAS NOT SEEN IN THE OFFICE. PATIENT HAS CONSENTED TO VIRTUAL VISIT / TELEMEDICINE VISIT   Location of patient: home  Location of provider: office  I discussed the limitations of evaluation and management by telemedicine and the availability of in person appointments. The patient expressed understanding and agreed to proceed.  History of Present Illness: Routine office visit Asthma: Well-controlled, has not been using albuterol DM, check CBGs very rarely, last one he recalls 120 Ambulatory BP is typically good, today's is slightly elevated. We reviewed together his previous blood work and medication list.  Good compliance without apparent side effects Coronavirus: He is following all the precautions and sheltering at home.   Review of Systems Denies fever chills No chest pain, mild DOE at baseline. No abdominal pain, GERD symptoms or blood in the stools. He has some mild chronic cough, at baseline.  No sputum production.  Past Medical History:  Diagnosis Date  . Allergy    allergy shots Dr. Velora Heckler  . Anemia   . Anxiety   . Asthma    moderate persistant  . Bipolar affective ([place])   . Cataract    both eyes  . COPD (chronic obstructive pulmonary disease) ([place])   . Depression   . Diabetes mellitus   . DJD (degenerative joint disease)   . Dysphagia   . Gallstones    hx of, s/p cholecystectomy  . GERD (gastroesophageal reflux disease)   . Hepatitis A    as teenager 38's  . Hollenhorst plaque    right eye  . Hyperlipidemia   . Hypertension   . Kidney stones    hx of  .  Meniere's disease   . Motility disorder, esophageal   . Obesity   . Pancreatitis   . Personal history of colonic polyps 11/2004   hyperplastic.  . Stroke [facility])    per MRI    Past Surgical History:  Procedure Laterality Date  . CATARACT EXTRACTION Bilateral 09/2015 and 10/2015  . CHOLECYSTECTOMY    . COLONOSCOPY    . TOTAL KNEE ARTHROPLASTY Bilateral    both knees    Social History   Socioeconomic History  . Marital status: Married    Spouse name: Not on file  . Number of children: 1  . Years of education: 40  . Highest education level: Bachelor's degree (e.g., BA, AB, BS)  Occupational History  . Occupation: disabled    Employer: DISABLED    Comment: laboratory computing/programming  Social Needs  . Financial resource strain: Not hard at all  . Food insecurity:    Worry: Never true    Inability: Never true  . Transportation needs:    Medical: No    Non-medical: No  Tobacco Use  . Smoking status: Former Smoker    Last attempt to quit: 11/15/1964    Years since quitting: 54.4  . Smokeless tobacco: Never Used  Substance and Sexual Activity  . Alcohol use: Not Currently  . Drug use: No  . Sexual activity: Not Currently  Lifestyle  . Physical  activity:    Days per week: 0 days    Minutes per session: 0 min  . Stress: Not at all  Relationships  . Social connections:    Talks on phone: More than three times a week    Gets together: More than three times a week    Attends religious service: More than 4 times per year    Active member of club or organization: Yes    Attends meetings of clubs or organizations: 1 to 4 times per year    Relationship status: Married  . Intimate partner violence:    Fear of current or ex partner: No    Emotionally abused: No    Physically abused: No    Forced sexual activity: No  Other Topics Concern  . Not on file  Social History Narrative   Household-- pt , wife, adopted  daughter (h/o substance abuse,on a methadone program,  doing better )      Allergies as of 04/10/2019      Reactions   Celexa  [citalopram Hydrobromide] Other (See Comments)   Depakote  [divalproex Sodium] Other (See Comments)   Methocarbamol    Other reaction(s): Hallucinations   Paroxetine Hcl    Other reaction(s): Insomnia   Smz-tmp Ds [sulfamethoxazole-trimethoprim] Nausea And Vomiting   Divalproex Sodium Other (See Comments)   Pancreatitis    Methocarbamol Other (See Comments)   hallucinations   Other    "Symbrax" alteration in blood sugar, pt unsure if low or high blood sugar   Paroxetine Other (See Comments)   Insomnia   Percocet [oxycodone-acetaminophen] Other (See Comments)   Hyperactivity   Risperidone Other (See Comments)   REACTION: hyper   Wellbutrin [bupropion] Other (See Comments)   Hot flashes   Citalopram Hydrobromide Other (See Comments)   Insomnia      Medication List       Accurate as of Apr 10, 2019 11:59 PM. If you have any questions, ask your nurse or doctor.        albuterol 108 (90 Base) MCG/ACT inhaler Commonly known as:  VENTOLIN HFA Inhale 2 puffs into the lungs every 6 (six) hours as needed for wheezing or shortness of breath.   albuterol (2.5 MG/3ML) 0.083% nebulizer solution Commonly known as:  PROVENTIL Take 3 mLs (2.5 mg total) by nebulization every 6 (six) hours as needed for wheezing or shortness of breath.   augmented betamethasone dipropionate 0.05 % ointment Commonly known as:  DIPROLENE-AF Apply topically 2 (two) times daily as needed. What changed:    how much to take  reasons to take this   budesonide-formoterol 160-4.5 MCG/ACT inhaler Commonly known as:  SYMBICORT Inhale 2 puffs into the lungs 2 (two) times daily.   clonazePAM 0.5 MG tablet Commonly known as:  KLONOPIN Take 0.5 mg by mouth 2 (two) times daily. 1 mg in the morning and 0.5 mg in the evening.   clopidogrel 75 MG tablet Commonly known as:  PLAVIX Take 1 tablet (75 mg total) by mouth daily.    clotrimazole-betamethasone cream Commonly known as:  LOTRISONE Apply 1 application topically 2 (two) times daily.   Cymbalta 60 MG capsule Generic drug:  DULoxetine Take 60 mg by mouth every morning.   diclofenac sodium 1 % Gel Commonly known as:  VOLTAREN Apply 4 g topically 4 (four) times daily as needed.   EpiPen 2-Pak 0.3 mg/0.3 mL Soaj injection Generic drug:  EPINEPHrine Reported on 04/29/2016   esomeprazole 40 MG capsule Commonly known as:  NEXIUM Take 80 mg by mouth every morning.   fexofenadine 180 MG tablet Commonly known as:  ALLEGRA Take 180 mg by mouth every morning.   glucose blood test strip Commonly known as:  OneTouch Verio Check blood sugar no more than twice daily   lamoTRIgine 25 MG tablet Commonly known as:  LAMICTAL Take 50 mg by mouth at bedtime.   losartan 25 MG tablet Commonly known as:  COZAAR Take 1 tablet (25 mg total) by mouth every evening.   multivitamin tablet Take 1 tablet by mouth daily.   onetouch ultrasoft lancets Check blood sugars no more than twice daily   pioglitazone-metformin 15-850 MG tablet Commonly known as:  ACTOPLUS MET Take 1 tablet by mouth 2 (two) times daily with a meal.   Saphris 5 MG Subl 24 hr tablet Generic drug:  asenapine Place 5 mg under the tongue at bedtime.   simvastatin 40 MG tablet Commonly known as:  ZOCOR Take 1 tablet (40 mg total) by mouth at bedtime.   traZODone 50 MG tablet Commonly known as:  DESYREL Take 50 mg by mouth at bedtime as needed for sleep.   Veramyst 27.5 MCG/SPRAY nasal spray Generic drug:  fluticasone Place 2 sprays into the nose daily as needed for rhinitis or allergies.           Objective:   Physical Exam BP (!) 158/83   Pulse 85   Temp (!) 97.3 F (36.3 C)   Ht 5' 8"  (1.727 m)   Wt 259 lb 5 oz (117.6 kg)   SpO2 94%   BMI 39.43 kg/m  Is a virtual video visit.  Patient is alert oriented x3, no apparent distress. Vital signs at home today: 158/83, pulse  85.  O2 sat 94%.  Temperature 97.3.  Weight 259 pounds    Assessment     Assessment DM HTN Hyperlipidemia Morbid obesity (BMI 39 plus DM) Bipolar, Depression ------ see psychiatry, Dr Casimiro Needle DJD-- hydrocodone rarely rx by [place] ortho Asthma-Allergies ------------ Dr [name]  GERD, h/o dysphagia (chronic, neurogenic-transfer dysphagia? See GI note 12-2011) Neuro: --? stroke : saw neuro 2013 d/t B transient visual loss, ASA changed to plavix.  --See CTA, MRIs of head-neck report  --Saw neuro 11-2013: fall, syncope,parkinsonism d/t sapharis? CV: Enlarged ascending Ao per CT 12-2015,CT chest 08-2016 stable.  Again is stable per MRI chest 10/2017, no further routine x-rays recommended Nl LE arterial dopplers 2015 Carotid artery disease: Per ultrasound 2011, last [place] 2018: <50% B, rx medical treatment, recheck 2 years  CT chest  RML  32m: 07-2014, CT 12-2015, CT 08-2016: Stable, no further eval. RML is stable again per MRI chest 10/2017 Enlarged Ascending Ao 3.9 cm: f/u yearly.  Last CT 08-2016.  Stable  HOH H/o Mnire's disease  Iron deficiency anemia: Noted 01/2018, + Hemoccult, 09-2018: Colonoscopy and EGD were completely normal   PLAN: DM: Well-controlled per last A1c, continue Actos plus met HTN: Ambulatory BPs in the 130s, today slightly elevated, for now continue monitoring and taking losartan DOE: At baseline since last visit Hyperlipidemia: Controlled on simvastatin. Morbid obesity: Has been unable to exercise much, gym is close, recommend to stay active walking around his neighborhood. Bipolar, depression: On multiple meds, doing well. Iron deficiency anemia: On no iron supplements, stable per last hemoglobin, recheck on RTC DJD: Currently on Tylenol. Coronavirus: Sheltering well at home, no symptoms RTC 3 months     I discussed the assessment and treatment plan with the patient. The patient was  provided an opportunity to ask questions and all were answered. The patient agreed  with the plan and demonstrated an understanding of the instructions.   The patient was advised to call back or seek an in-person evaluation if the symptoms worsen or if the condition fails to improve as anticipated.

## 2019-04-11 DIAGNOSIS — J301 Allergic rhinitis due to pollen: Secondary | ICD-10-CM | POA: Diagnosis not present

## 2019-04-11 DIAGNOSIS — J3089 Other allergic rhinitis: Secondary | ICD-10-CM | POA: Diagnosis not present

## 2019-04-11 NOTE — Assessment & Plan Note (Signed)
DM: Well-controlled per last A1c, continue Actos plus met HTN: Ambulatory BPs in the 130s, today slightly elevated, for now continue monitoring and taking losartan DOE: At baseline since last visit Hyperlipidemia: Controlled on simvastatin. Morbid obesity: Has been unable to exercise much, gym is close, recommend to stay active walking around his neighborhood. Bipolar, depression: On multiple meds, doing well. Iron deficiency anemia: On no iron supplements, stable per last hemoglobin, recheck on RTC DJD: Currently on Tylenol. Coronavirus: Sheltering well at home, no symptoms RTC 3 months

## 2019-04-15 ENCOUNTER — Other Ambulatory Visit: Payer: Self-pay | Admitting: Internal Medicine

## 2019-04-18 DIAGNOSIS — J3089 Other allergic rhinitis: Secondary | ICD-10-CM | POA: Diagnosis not present

## 2019-04-18 DIAGNOSIS — J301 Allergic rhinitis due to pollen: Secondary | ICD-10-CM | POA: Diagnosis not present

## 2019-04-23 ENCOUNTER — Ambulatory Visit: Payer: Self-pay

## 2019-04-23 NOTE — Telephone Encounter (Signed)
Pt. called to report an injury to left rib cage, approx. 10 days ago.  Stated he was reaching into his trash can, outside, and slipped and the trash can hit his upper left rib cage.  Stated that he was taking Tylenol, and it seemed better, but now it is getting worse.  Rated left upper rib cage tenderness at "7/10."  Denied any bruising or swelling at the area of tenderness, approx. 4 inches below left axilla, on left upper rib cage.  Denied any difficulty with getting breath; denied resp. distress.  Stated it does hurt to take a deep breath.  Denied any chest pain, with exception of the tenderness on left upper rib area.  Denied cough.  Care advice given per protocol.  Advised the office is closed and will need to send Triage note to office to request appt. for tomorrow.  Encouraged to go to ER if breathing becomes more difficult, or pain becomes unmanageable.  Verb. Understanding.  Agreed with plan.   Reason for Disposition . [1] Chest wall swelling or pain AND [2] present > 7 days  Answer Assessment - Initial Assessment Questions 1. MECHANISM: "How did the injury happen?"     He was adjusting outdoor trash can and slipped causing the trash can hit left rib cage 2. ONSET: "When did the injury happen?" (Minutes or hours ago)    10 days ago  3. LOCATION: "Where on the chest is the injury located?"     Upper left rib cage,  about 4-5 " below the axilla 4. APPEARANCE: "What does the injury look like?"     No change that he can tell; denied bruising or swelling  5. BLEEDING: "Is there any bleeding now? If so, ask: How long has it been bleeding?"    Denied 6. SEVERITY: "Any difficulty with breathing?"   Denied shortness of breath 7. SIZE: For cuts, bruises, or swelling, ask: "How large is it?" (e.g., inches or centimeters)     Denied any cuts or bruising or swelling 8. PAIN: "Is there pain?" If so, ask: "How bad is the pain?"   (e.g., Scale 1-10; or mild, moderate, severe)     Tenderness in the left  rib cage 7/10 9. TETANUS: For any breaks in the skin, ask: "When was the last tetanus booster?"     N/a  10. PREGNANCY: "Is there any chance you are pregnant?" "When was your last menstrual period?"       N/a  Protocols used: CHEST INJURY-A-AH

## 2019-04-24 ENCOUNTER — Encounter: Payer: Self-pay | Admitting: Internal Medicine

## 2019-04-24 ENCOUNTER — Other Ambulatory Visit: Payer: Self-pay

## 2019-04-24 ENCOUNTER — Ambulatory Visit (INDEPENDENT_AMBULATORY_CARE_PROVIDER_SITE_OTHER): Payer: Medicare Other | Admitting: Internal Medicine

## 2019-04-24 VITALS — BP 149/79 | HR 80 | Ht 68.0 in | Wt 259.0 lb

## 2019-04-24 DIAGNOSIS — S299XXA Unspecified injury of thorax, initial encounter: Secondary | ICD-10-CM | POA: Diagnosis not present

## 2019-04-24 DIAGNOSIS — R0789 Other chest pain: Secondary | ICD-10-CM

## 2019-04-24 NOTE — Telephone Encounter (Signed)
Virtual visit scheduled.  

## 2019-04-24 NOTE — Progress Notes (Signed)
Subjective:    Patient ID: Ryan Zhang, male    DOB: August 22, 1946, 73 y.o.   MRN: [id number]  DOS:  04/24/2019 Type of visit - description: Virtual Visit via Video Note  I connected with@ on 04/24/19 at  4:00 PM EDT by a video enabled telemedicine application and verified that I am speaking with the correct person using two identifiers.   THIS ENCOUNTER IS A VIRTUAL VISIT DUE TO COVID-19 - PATIENT WAS NOT SEEN IN THE OFFICE. PATIENT HAS CONSENTED TO VIRTUAL VISIT / TELEMEDICINE VISIT   Location of patient: home  Location of provider: office  I discussed the limitations of evaluation and management by telemedicine and the availability of in person appointments. The patient expressed understanding and agreed to proceed.  History of Present Illness: Acute 10 days ago, he slipped and fell, hurting the left chest laterally w/ the edge of the trash can. Immediately developed pain, Tylenol helped but subsequently he stopped Tylenol and the pain resurface. Is 10 days already since the accident and he thinks he should be improving.  The area is TTP, no swelling, no discoloration. Pain is worse with certain movements of the torso.  He spoke with a nurse prior to this visit, was recommended deep breathing exercises which he is doing, the exercises do hurt slightly at the area of the impact but also @ the anterior chest.  Review of Systems Denies fever chills Mild shortness of breath, essentially at baseline No edema   Past Medical History:  Diagnosis Date  . Allergy    allergy shots Dr. Velora Heckler  . Anemia   . Anxiety   . Asthma    moderate persistant  . Bipolar affective ([place])   . Cataract    both eyes  . COPD (chronic obstructive pulmonary disease) ([place])   . Depression   . Diabetes mellitus   . DJD (degenerative joint disease)   . Dysphagia   . Gallstones    hx of, s/p cholecystectomy  . GERD (gastroesophageal reflux disease)   . Hepatitis A    as teenager 40's  .  Hollenhorst plaque    right eye  . Hyperlipidemia   . Hypertension   . Kidney stones    hx of  . Meniere's disease   . Motility disorder, esophageal   . Obesity   . Pancreatitis   . Personal history of colonic polyps 11/2004   hyperplastic.  . Stroke [facility])    per MRI    Past Surgical History:  Procedure Laterality Date  . CATARACT EXTRACTION Bilateral 09/2015 and 10/2015  . CHOLECYSTECTOMY    . COLONOSCOPY    . TOTAL KNEE ARTHROPLASTY Bilateral    both knees    Social History   Socioeconomic History  . Marital status: Married    Spouse name: Not on file  . Number of children: 1  . Years of education: 35  . Highest education level: Bachelor's degree (e.g., BA, AB, BS)  Occupational History  . Occupation: disabled    Employer: DISABLED    Comment: laboratory computing/programming  Social Needs  . Financial resource strain: Not hard at all  . Food insecurity:    Worry: Never true    Inability: Never true  . Transportation needs:    Medical: No    Non-medical: No  Tobacco Use  . Smoking status: Former Smoker    Last attempt to quit: 11/15/1964    Years since quitting: 54.4  . Smokeless tobacco: Never Used  Substance  and Sexual Activity  . Alcohol use: Not Currently  . Drug use: No  . Sexual activity: Not Currently  Lifestyle  . Physical activity:    Days per week: 0 days    Minutes per session: 0 min  . Stress: Not at all  Relationships  . Social connections:    Talks on phone: More than three times a week    Gets together: More than three times a week    Attends religious service: More than 4 times per year    Active member of club or organization: Yes    Attends meetings of clubs or organizations: 1 to 4 times per year    Relationship status: Married  . Intimate partner violence:    Fear of current or ex partner: No    Emotionally abused: No    Physically abused: No    Forced sexual activity: No  Other Topics Concern  . Not on file  Social History  Narrative   Household-- pt , wife, adopted  daughter (h/o substance abuse,on a methadone program, doing better )      Allergies as of 04/24/2019      Reactions   Celexa  [citalopram Hydrobromide] Other (See Comments)   Depakote  [divalproex Sodium] Other (See Comments)   Methocarbamol    Other reaction(s): Hallucinations   Paroxetine Hcl    Other reaction(s): Insomnia   Smz-tmp Ds [sulfamethoxazole-trimethoprim] Nausea And Vomiting   Divalproex Sodium Other (See Comments)   Pancreatitis    Methocarbamol Other (See Comments)   hallucinations   Other    "Symbrax" alteration in blood sugar, pt unsure if low or high blood sugar   Paroxetine Other (See Comments)   Insomnia   Percocet [oxycodone-acetaminophen] Other (See Comments)   Hyperactivity   Risperidone Other (See Comments)   REACTION: hyper   Wellbutrin [bupropion] Other (See Comments)   Hot flashes   Citalopram Hydrobromide Other (See Comments)   Insomnia      Medication List       Accurate as of April 24, 2019  4:39 PM. If you have any questions, ask your nurse or doctor.        albuterol 108 (90 Base) MCG/ACT inhaler Commonly known as:  VENTOLIN HFA Inhale 2 puffs into the lungs every 6 (six) hours as needed for wheezing or shortness of breath.   albuterol (2.5 MG/3ML) 0.083% nebulizer solution Commonly known as:  PROVENTIL Take 3 mLs (2.5 mg total) by nebulization every 6 (six) hours as needed for wheezing or shortness of breath.   augmented betamethasone dipropionate 0.05 % ointment Commonly known as:  DIPROLENE-AF Apply topically 2 (two) times daily as needed. What changed:    how much to take  reasons to take this   budesonide-formoterol 160-4.5 MCG/ACT inhaler Commonly known as:  SYMBICORT Inhale 2 puffs into the lungs 2 (two) times daily.   clonazePAM 0.5 MG tablet Commonly known as:  KLONOPIN Take 0.5 mg by mouth 2 (two) times daily. 1 mg in the morning and 0.5 mg in the evening.   clopidogrel  75 MG tablet Commonly known as:  PLAVIX Take 1 tablet (75 mg total) by mouth daily.   clotrimazole-betamethasone cream Commonly known as:  LOTRISONE Apply 1 application topically 2 (two) times daily.   Cymbalta 60 MG capsule Generic drug:  DULoxetine Take 60 mg by mouth every morning.   diclofenac sodium 1 % Gel Commonly known as:  VOLTAREN Apply 4 g topically 4 (four) times daily as  needed.   EpiPen 2-Pak 0.3 mg/0.3 mL Soaj injection Generic drug:  EPINEPHrine Reported on 04/29/2016   esomeprazole 40 MG capsule Commonly known as:  NEXIUM Take 80 mg by mouth every morning.   fexofenadine 180 MG tablet Commonly known as:  ALLEGRA Take 180 mg by mouth every morning.   glucose blood test strip Commonly known as:  OneTouch Verio Check blood sugar no more than twice daily   lamoTRIgine 25 MG tablet Commonly known as:  LAMICTAL Take 50 mg by mouth at bedtime.   losartan 25 MG tablet Commonly known as:  COZAAR Take 1 tablet (25 mg total) by mouth every evening.   multivitamin tablet Take 1 tablet by mouth daily.   onetouch ultrasoft lancets Check blood sugars no more than twice daily   pioglitazone-metformin 15-850 MG tablet Commonly known as:  ACTOPLUS MET Take 1 tablet by mouth 2 (two) times daily with a meal.   Saphris 5 MG Subl 24 hr tablet Generic drug:  asenapine Place 5 mg under the tongue at bedtime.   simvastatin 40 MG tablet Commonly known as:  ZOCOR TAKE ONE TABLET BY MOUTH AT BEDTIME   traZODone 50 MG tablet Commonly known as:  DESYREL Take 50 mg by mouth at bedtime as needed for sleep.   Veramyst 27.5 MCG/SPRAY nasal spray Generic drug:  fluticasone Place 2 sprays into the nose daily as needed for rhinitis or allergies.           Objective:   Physical Exam BP (!) 149/79   Pulse 80   Ht 5' 8"  (1.727 m)   Wt 259 lb (117.5 kg)   SpO2 95%   BMI 39.38 kg/m  This is a virtual video visit.  He is alert oriented x3, no apparent distress     Assessment      Assessment DM HTN Hyperlipidemia Morbid obesity (BMI 39 plus DM) Bipolar, Depression ------ see psychiatry, Dr Casimiro Needle DJD-- hydrocodone rarely rx by [place] ortho Asthma-Allergies ------------ Dr [name]  GERD, h/o dysphagia (chronic, neurogenic-transfer dysphagia? See GI note 12-2011) Neuro: --? stroke : saw neuro 2013 d/t B transient visual loss, ASA changed to plavix.  --See CTA, MRIs of head-neck report  --Saw neuro 11-2013: fall, syncope,parkinsonism d/t sapharis? CV: Enlarged ascending Ao  --per CT 12-2015,CT chest 08-2016 stable.    --MRI chest 10/2017 stable, no further routine x-rays recommended --CT chest 07/2018: Atherosclerotic changes in the thoracic aorta.  Nl LE arterial dopplers 2015 Carotid artery disease: Per ultrasound 2011, last [place] 2018: <50% B, rx medical treatment, recheck 2 years  CT chest  ---RML  40m: 07-2014, CT 12-2015, CT 08-2016, MRI chest 12,2018, CT chest 07/2018:Stable  HOH H/o Mnire's disease  Iron deficiency anemia: Noted 01/2018, + Hemoccult, 09-2018: Colonoscopy and EGD were completely normal   PLAN: Chest injury: As described above, recommend good fall precautions, restart Tylenol, will get a left rib series tomorrow, if there is no fracture, anticipate he should get better in 7-10 additional days. He is recommended to call immediately if he has any severe or different chest pain.    I discussed the assessment and treatment plan with the patient. The patient was provided an opportunity to ask questions and all were answered. The patient agreed with the plan and demonstrated an understanding of the instructions.   The patient was advised to call back or seek an in-person evaluation if the symptoms worsen or if the condition fails to improve as anticipated.

## 2019-04-25 ENCOUNTER — Ambulatory Visit (HOSPITAL_BASED_OUTPATIENT_CLINIC_OR_DEPARTMENT_OTHER)
Admission: RE | Admit: 2019-04-25 | Discharge: 2019-04-25 | Disposition: A | Discharge status: Home / Self Care | Payer: Medicare Other | Source: Ambulatory Visit | Attending: Internal Medicine | Admitting: Internal Medicine

## 2019-04-25 ENCOUNTER — Other Ambulatory Visit: Payer: Self-pay

## 2019-04-25 DIAGNOSIS — J301 Allergic rhinitis due to pollen: Secondary | ICD-10-CM | POA: Diagnosis not present

## 2019-04-25 DIAGNOSIS — R0781 Pleurodynia: Secondary | ICD-10-CM | POA: Diagnosis not present

## 2019-04-25 DIAGNOSIS — R0789 Other chest pain: Secondary | ICD-10-CM | POA: Insufficient documentation

## 2019-04-25 DIAGNOSIS — J3089 Other allergic rhinitis: Secondary | ICD-10-CM | POA: Diagnosis not present

## 2019-04-25 NOTE — Assessment & Plan Note (Signed)
Chest injury: As described above, recommend good fall precautions, restart Tylenol, will get a left rib series tomorrow, if there is no fracture, anticipate he should get better in 7-10 additional days. He is recommended to call immediately if he has any severe or different chest pain.

## 2019-05-02 DIAGNOSIS — J3089 Other allergic rhinitis: Secondary | ICD-10-CM | POA: Diagnosis not present

## 2019-05-02 DIAGNOSIS — J301 Allergic rhinitis due to pollen: Secondary | ICD-10-CM | POA: Diagnosis not present

## 2019-05-09 DIAGNOSIS — J3089 Other allergic rhinitis: Secondary | ICD-10-CM | POA: Diagnosis not present

## 2019-05-09 DIAGNOSIS — J301 Allergic rhinitis due to pollen: Secondary | ICD-10-CM | POA: Diagnosis not present

## 2019-05-16 DIAGNOSIS — J301 Allergic rhinitis due to pollen: Secondary | ICD-10-CM | POA: Diagnosis not present

## 2019-05-16 DIAGNOSIS — J3089 Other allergic rhinitis: Secondary | ICD-10-CM | POA: Diagnosis not present

## 2019-05-19 ENCOUNTER — Other Ambulatory Visit: Payer: Self-pay | Admitting: Internal Medicine

## 2019-05-23 DIAGNOSIS — J3089 Other allergic rhinitis: Secondary | ICD-10-CM | POA: Diagnosis not present

## 2019-05-23 DIAGNOSIS — J301 Allergic rhinitis due to pollen: Secondary | ICD-10-CM | POA: Diagnosis not present

## 2019-05-30 ENCOUNTER — Other Ambulatory Visit: Payer: Self-pay | Admitting: Internal Medicine

## 2019-05-30 DIAGNOSIS — J3089 Other allergic rhinitis: Secondary | ICD-10-CM | POA: Diagnosis not present

## 2019-05-30 DIAGNOSIS — J301 Allergic rhinitis due to pollen: Secondary | ICD-10-CM | POA: Diagnosis not present

## 2019-06-06 DIAGNOSIS — J301 Allergic rhinitis due to pollen: Secondary | ICD-10-CM | POA: Diagnosis not present

## 2019-06-06 DIAGNOSIS — J3089 Other allergic rhinitis: Secondary | ICD-10-CM | POA: Diagnosis not present

## 2019-06-08 DIAGNOSIS — H35371 Puckering of macula, right eye: Secondary | ICD-10-CM | POA: Diagnosis not present

## 2019-06-08 DIAGNOSIS — H35033 Hypertensive retinopathy, bilateral: Secondary | ICD-10-CM | POA: Diagnosis not present

## 2019-06-08 DIAGNOSIS — H26491 Other secondary cataract, right eye: Secondary | ICD-10-CM | POA: Diagnosis not present

## 2019-06-08 DIAGNOSIS — E119 Type 2 diabetes mellitus without complications: Secondary | ICD-10-CM | POA: Diagnosis not present

## 2019-06-08 DIAGNOSIS — Z961 Presence of intraocular lens: Secondary | ICD-10-CM | POA: Diagnosis not present

## 2019-06-08 LAB — HM DIABETES EYE EXAM

## 2019-06-13 DIAGNOSIS — J301 Allergic rhinitis due to pollen: Secondary | ICD-10-CM | POA: Diagnosis not present

## 2019-06-13 DIAGNOSIS — J3089 Other allergic rhinitis: Secondary | ICD-10-CM | POA: Diagnosis not present

## 2019-06-14 ENCOUNTER — Encounter: Payer: Self-pay | Admitting: Internal Medicine

## 2019-06-20 DIAGNOSIS — J301 Allergic rhinitis due to pollen: Secondary | ICD-10-CM | POA: Diagnosis not present

## 2019-06-20 DIAGNOSIS — J3089 Other allergic rhinitis: Secondary | ICD-10-CM | POA: Diagnosis not present

## 2019-06-27 DIAGNOSIS — J3089 Other allergic rhinitis: Secondary | ICD-10-CM | POA: Diagnosis not present

## 2019-06-27 DIAGNOSIS — J301 Allergic rhinitis due to pollen: Secondary | ICD-10-CM | POA: Diagnosis not present

## 2019-07-02 ENCOUNTER — Encounter: Payer: Self-pay | Admitting: Internal Medicine

## 2019-07-02 ENCOUNTER — Ambulatory Visit: Payer: Self-pay

## 2019-07-02 ENCOUNTER — Other Ambulatory Visit: Payer: Self-pay

## 2019-07-02 ENCOUNTER — Ambulatory Visit (INDEPENDENT_AMBULATORY_CARE_PROVIDER_SITE_OTHER): Payer: Medicare Other | Admitting: Internal Medicine

## 2019-07-02 VITALS — BP 129/65 | HR 102 | Temp 97.1°F | Resp 16 | Ht 68.0 in | Wt 263.4 lb

## 2019-07-02 DIAGNOSIS — R000 Tachycardia, unspecified: Secondary | ICD-10-CM

## 2019-07-02 DIAGNOSIS — E1142 Type 2 diabetes mellitus with diabetic polyneuropathy: Secondary | ICD-10-CM

## 2019-07-02 DIAGNOSIS — I10 Essential (primary) hypertension: Secondary | ICD-10-CM

## 2019-07-02 DIAGNOSIS — R310 Gross hematuria: Secondary | ICD-10-CM

## 2019-07-02 LAB — POC URINALSYSI DIPSTICK (AUTOMATED)
Glucose, UA: NEGATIVE
Nitrite, UA: POSITIVE
Protein, UA: POSITIVE — AB
Spec Grav, UA: 1.02 (ref 1.010–1.025)
Urobilinogen, UA: 2 E.U./dL — AB
pH, UA: 6.5 (ref 5.0–8.0)

## 2019-07-02 MED ORDER — AMOXICILLIN-POT CLAVULANATE 875-125 MG PO TABS: 1.0000 | ORAL_TABLET | Freq: Two times a day (BID) | ORAL | 0 refills | Status: DC

## 2019-07-02 MED FILL — AMOX-CLAV 875-125 MG TABLET: 875-125 | 10 days supply | Qty: 20 | Fill #0

## 2019-07-02 NOTE — Progress Notes (Signed)
Subjective:    Patient ID: Ryan Zhang, male    DOB: [birth year], 73 y.o.   MRN: [id number]  DOS:  07/02/2019 Type of visit - description: Acute visit Symptoms started this morning when he noted some blood in the urine described as a "tan" color Subsequently he urinated again and saw clots. The last time he urinated, it was "pure blood". Last voiding was 20 minutes ago, denies any suprapubic pressure.  Yesterday started to take a medication called ProstaGenix, OTC, I reviewed the bottle, it has multiple chemicals on it. So far has taken 4 tablets.  Review of Systems Denies fever chills No nausea, vomiting, diarrhea. No abdominal or flank pain No blood in the stools, BMs normal. Has L UTS, they are at baseline.  If anything mild dysuria the last time he went to the bathroom.   Past Medical History:  Diagnosis Date   Allergy    allergy shots Dr. Velora Heckler   Anemia    Anxiety    Asthma    moderate persistant   Bipolar affective ([facility])    Cataract    both eyes   COPD (chronic obstructive pulmonary disease) ([facility])    Depression    Diabetes mellitus    DJD (degenerative joint disease)    Dysphagia    Gallstones    hx of, s/p cholecystectomy   GERD (gastroesophageal reflux disease)    Hepatitis A    as teenager 60's   Hollenhorst plaque    right eye   Hyperlipidemia    Hypertension    Kidney stones    hx of   Meniere's disease    Motility disorder, esophageal    Obesity    Pancreatitis    Personal history of colonic polyps 11/2004   hyperplastic.   Stroke [facility])    per MRI    Past Surgical History:  Procedure Laterality Date   CATARACT EXTRACTION Bilateral 09/2015 and 10/2015   CHOLECYSTECTOMY     COLONOSCOPY     TOTAL KNEE ARTHROPLASTY Bilateral    both knees    Social History   Socioeconomic History   Marital status: Married    Spouse name: Not on file   Number of children: 1   Years of education: 17   Highest education  level: Bachelor's degree (e.g., BA, AB, BS)  Occupational History   Occupation: disabled    Employer: DISABLED    Comment: laboratory computing/programming  Social Designer, fashion/clothing strain: Not hard at all   Food insecurity    Worry: Never true    Inability: Never true   Transportation needs    Medical: No    Non-medical: No  Tobacco Use   Smoking status: Former Smoker    Quit date: 11/15/1964    Years since quitting: 59.6   Smokeless tobacco: Never Used  Substance and Sexual Activity   Alcohol use: Not Currently   Drug use: No   Sexual activity: Not Currently  Lifestyle   Physical activity    Days per week: 0 days    Minutes per session: 0 min   Stress: Not at all  Relationships   Social connections    Talks on phone: More than three times a week    Gets together: More than three times a week    Attends religious service: More than 4 times per year    Active member of club or organization: Yes    Attends meetings of clubs or organizations: 1 to 4  times per year    Relationship status: Married   Intimate partner violence    Fear of current or ex partner: No    Emotionally abused: No    Physically abused: No    Forced sexual activity: No  Other Topics Concern   Not on file  Social History Narrative   Household-- pt , wife, adopted  daughter (h/o substance abuse,on a methadone program, doing better )      Allergies as of 07/02/2019      Reactions   Celexa  [citalopram Hydrobromide] Other (See Comments)   Depakote  [divalproex Sodium] Other (See Comments)   Methocarbamol    Other reaction(s): Hallucinations   Paroxetine Hcl    Other reaction(s): Insomnia   Smz-tmp Ds [sulfamethoxazole-trimethoprim] Nausea And Vomiting   Divalproex Sodium Other (See Comments)   Pancreatitis    Methocarbamol Other (See Comments)   hallucinations   Other    "Symbrax" alteration in blood sugar, pt unsure if low or high blood sugar   Paroxetine Other (See  Comments)   Insomnia   Percocet [oxycodone-acetaminophen] Other (See Comments)   Hyperactivity   Risperidone Other (See Comments)   REACTION: hyper   Wellbutrin [bupropion] Other (See Comments)   Hot flashes   Citalopram Hydrobromide Other (See Comments)   Insomnia      Medication List       Accurate as of July 02, 2019 11:59 PM. If you have any questions, ask your nurse or doctor.        albuterol 108 (90 Base) MCG/ACT inhaler Commonly known as: VENTOLIN HFA Inhale 2 puffs into the lungs every 6 (six) hours as needed for wheezing or shortness of breath.   albuterol (2.5 MG/3ML) 0.083% nebulizer solution Commonly known as: PROVENTIL Take 3 mLs (2.5 mg total) by nebulization every 6 (six) hours as needed for wheezing or shortness of breath.   amoxicillin-clavulanate 875-125 MG tablet Commonly known as: Augmentin Take 1 tablet by mouth 2 (two) times daily. Started by: Kathlene November, MD   augmented betamethasone dipropionate 0.05 % ointment Commonly known as: DIPROLENE-AF Apply topically 2 (two) times daily as needed. What changed:   how much to take  reasons to take this   budesonide-formoterol 160-4.5 MCG/ACT inhaler Commonly known as: SYMBICORT Inhale 2 puffs into the lungs 2 (two) times daily.   clonazePAM 0.5 MG tablet Commonly known as: KLONOPIN Take 0.5 mg by mouth 2 (two) times daily. 1 mg in the morning and 0.5 mg in the evening.   clopidogrel 75 MG tablet Commonly known as: PLAVIX TAKE ONE TABLET BY MOUTH ONE TIME DAILY   clotrimazole-betamethasone cream Commonly known as: LOTRISONE Apply 1 application topically 2 (two) times daily.   Cymbalta 60 MG capsule Generic drug: DULoxetine Take 60 mg by mouth every morning.   diclofenac sodium 1 % Gel Commonly known as: VOLTAREN Apply 4 g topically 4 (four) times daily as needed.   EpiPen 2-Pak 0.3 mg/0.3 mL Soaj injection Generic drug: EPINEPHrine Reported on 04/29/2016   esomeprazole 40 MG  capsule Commonly known as: NEXIUM Take 80 mg by mouth every morning.   fexofenadine 180 MG tablet Commonly known as: ALLEGRA Take 180 mg by mouth every morning.   glucose blood test strip Commonly known as: OneTouch Verio Check blood sugar no more than twice daily   lamoTRIgine 25 MG tablet Commonly known as: LAMICTAL Take 50 mg by mouth at bedtime.   losartan 25 MG tablet Commonly known as: COZAAR Take 1 tablet (  25 mg total) by mouth every evening.   multivitamin tablet Take 1 tablet by mouth daily.   onetouch ultrasoft lancets Check blood sugars no more than twice daily   pioglitazone-metformin 15-850 MG tablet Commonly known as: ACTOPLUS MET Take 1 tablet by mouth 2 (two) times daily with a meal.   Saphris 5 MG Subl 24 hr tablet Generic drug: asenapine Place 5 mg under the tongue at bedtime.   simvastatin 40 MG tablet Commonly known as: ZOCOR TAKE ONE TABLET BY MOUTH AT BEDTIME   traZODone 50 MG tablet Commonly known as: DESYREL Take 50 mg by mouth at bedtime as needed for sleep.   Veramyst 27.5 MCG/SPRAY nasal spray Generic drug: fluticasone Place 2 sprays into the nose daily as needed for rhinitis or allergies.           Objective:   Physical Exam BP 129/65 (BP Location: Left Arm, Patient Position: Sitting, Cuff Size: Normal)    Pulse (!) 102    Temp (!) 97.1 F (36.2 C) (Temporal)    Resp 16    Ht '5\' 8"'$  (1.727 m)    Wt 263 lb 6 oz (119.5 kg)    SpO2 98%    BMI 40.05 kg/m  General:   Well developed, NAD, BMI noted.  HEENT:  Normocephalic . Face symmetric, atraumatic Lungs:  CTA B Normal respiratory effort, no intercostal retractions, no accessory muscle use. Heart: RRR,  no murmur.  no pretibial edema bilaterally  Abdomen:  Not distended, soft, non-tender. No rebound or rigidity.  No CVA tenderness.  No suprapubic mass or tenderness. Skin: Not pale. Not jaundice Neurologic:  alert & oriented X3.  Speech normal, gait appropriate for age and  unassisted Psych--  Cognition and judgment appear intact.  Cooperative with normal attention span and concentration.  Behavior appropriate. No anxious or depressed appearing.     Assessment       Assessment DM HTN Hyperlipidemia Morbid obesity (BMI 39 plus DM) Bipolar, Depression ------ see psychiatry, Dr Casimiro Needle DJD-- hydrocodone rarely rx by [place] orth[facility] Asthma-Allergies ------------ Dr [name]  GERD, h/o dysphagia (chronic, neurogenic-transfer dysphagia? See GI note 12-2011) Neuro: --? stroke : saw neuro 2013 d/t B transient visual loss, ASA changed to plavix.  --See CTA, MRIs of head-neck report  --Saw neuro 11-2013: fall, syncope,parkinsonism d/t sapharis? CV: Enlarged ascending Ao  --per CT 12-2015,CT chest 08-2016 stable.    --MRI chest 10/2017 stable, no further routine x-rays recommended --CT chest 07/2018: Atherosclerotic changes in the thoracic aorta.  Nl LE arterial dopplers 2015 Carotid artery disease: Per ultrasound 2011, last [place] 2018: <50% B, rx medical treatment, recheck 2 years  CT chest  ---RML  4m: 07-2014, CT 12-2015, CT 08-2016, MRI chest 12,2018, CT chest 07/2018:Stable  HOH H/o Mnire's disease  Iron deficiency anemia: Noted 01/2018, + Hemoccult, 09-2018: Colonoscopy and EGD were completely normal   PLAN: Gross hematuria: Painless gross hematuria for less than 24 hours, has a history of urolithiasis, on Plavix.  Started a new prostate supplement yesterday and plans to stop it. Urine looks red, no clots.  Dip: + Bilirubin ketones leukocytes nitrate Plan: Drink plenty fluids, BMP, CBC, UA, urine culture, PSA ER if unable to urinate or suprapubic pressure indicating possible obstruction. Start empiric antibiotics with Augmentin (allergic to sulfa, noted enlarged ascending aorta in PMH, avoid Cipro if possible). Consider stop Plavix temporarily if not better Tachycardia: Mild tachycardia without fever, MS changes or hypotension, unlikely to be septic. RTC 10  days.

## 2019-07-02 NOTE — Telephone Encounter (Signed)
Pt called stating that he has had brown urine today and has been passing clots. He states that he has been taking a OTC medication for prostate and he feels that this may be the reason for the bleeding. He denies fever and pain. Call placed to office per patients wife request. Original triage suggested clots should be seen in ER.   Reason for Disposition . Taking Coumadin (warfarin) or other strong blood thinner, or known bleeding disorder (e.g., thrombocytopenia)  Answer Assessment - Initial Assessment Questions 1. COLOR of URINE: "Describe the color of the urine."  (e.g., tea-colored, pink, red, blood clots, bloody)     Tea color 2. ONSET: "When did the bleeding start?"      Just in the last few hours 3. EPISODES: "How many times has there been blood in the urine?" or "How many times today?"     frequent 10 times today 4. PAIN with URINATION: "Is there any pain with passing your urine?" If so, ask: "How bad is the pain?"  (Scale 1-10; or mild, moderate, severe)    - MILD - complains slightly about urination hurting    - MODERATE - interferes with normal activities      - SEVERE - excruciating, unwilling or unable to urinate because of the pain     no 5. FEVER: "Do you have a fever?" If so, ask: "What is your temperature, how was it measured, and when did it start?"     no 6. ASSOCIATED SYMPTOMS: "Are you passing urine more frequently than usual?"     Frequency clots 7. OTHER SYMPTOMS: "Do you have any other symptoms?" (e.g., back/flank pain, abdominal pain, vomiting)    no 8. PREGNANCY: "Is there any chance you are pregnant?" "When was your last menstrual period?"    N/A  Protocols used: URINE - BLOOD IN-A-AH

## 2019-07-02 NOTE — Patient Instructions (Addendum)
GO TO THE LAB : Get the blood work     GO TO THE FRONT DESK Schedule your next appointment   for checkup in 10 days  Drink plenty of fluids  Start the antibiotic call Augmentin.  ER if fever, chills, pain on the sides or at your abdomen, unable to urinate.  Call if not gradually better in the next 2 to 3 days

## 2019-07-02 NOTE — Progress Notes (Signed)
Pre visit review using our clinic review tool, if applicable. No additional management support is needed unless otherwise documented below in the visit note. 

## 2019-07-03 LAB — URINALYSIS, ROUTINE W REFLEX MICROSCOPIC
Ketones, ur: 15 — AB
Nitrite: POSITIVE — AB
Specific Gravity, Urine: 1.025 (ref 1.000–1.030)
Total Protein, Urine: >=300 — AB
Urine Glucose: NEGATIVE
Urobilinogen, UA: 4 — AB (ref 0.0–1.0)
pH: 6.5 (ref 5.0–8.0)

## 2019-07-03 LAB — BASIC METABOLIC PANEL
BUN: 25 mg/dL — ABNORMAL HIGH (ref 6–23)
CO2: 26 mEq/L (ref 19–32)
Calcium: 9.2 mg/dL (ref 8.4–10.5)
Chloride: 100 mEq/L (ref 96–112)
Creatinine, Ser: 0.96 mg/dL (ref 0.40–1.50)
GFR: 77.19 mL/min (ref >60.00)
Glucose, Bld: 175 mg/dL — ABNORMAL HIGH (ref 70–99)
Potassium: 4.5 mEq/L (ref 3.5–5.1)
Sodium: 136 mEq/L (ref 135–145)

## 2019-07-03 LAB — CBC WITH DIFFERENTIAL/PLATELET
Basophils Absolute: 0.1 10*3/uL (ref 0.0–0.1)
Basophils Relative: 0.5 % (ref 0.0–3.0)
Eosinophils Absolute: 0 10*3/uL (ref 0.0–0.7)
Eosinophils Relative: 0.3 % (ref 0.0–5.0)
HCT: 38.8 % — ABNORMAL LOW (ref 39.0–52.0)
Hemoglobin: 12.5 g/dL — ABNORMAL LOW (ref 13.0–17.0)
Lymphocytes Relative: 12.5 % (ref 12.0–46.0)
Lymphs Abs: 1.7 10*3/uL (ref 0.7–4.0)
MCHC: 32.3 g/dL (ref 30.0–36.0)
MCV: 85.6 fl (ref 78.0–100.0)
Monocytes Absolute: 0.8 10*3/uL (ref 0.1–1.0)
Monocytes Relative: 6.2 % (ref 3.0–12.0)
Neutro Abs: 10.8 10*3/uL — ABNORMAL HIGH (ref 1.4–7.7)
Neutrophils Relative %: 80.5 % — ABNORMAL HIGH (ref 43.0–77.0)
Platelets: 276 10*3/uL (ref 150.0–400.0)
RBC: 4.53 Mil/uL (ref 4.22–5.81)
RDW: 15.2 % (ref 11.5–15.5)
WBC: 13.4 10*3/uL — ABNORMAL HIGH (ref 4.0–10.5)

## 2019-07-03 LAB — PSA: PSA: 0.96 ng/mL (ref 0.10–4.00)

## 2019-07-03 NOTE — Assessment & Plan Note (Signed)
Gross hematuria: Painless gross hematuria for less than 24 hours, has a history of urolithiasis, on Plavix.  Started a new prostate supplement yesterday and plans to stop it. Urine looks red, no clots.  Dip: + Bilirubin ketones leukocytes nitrate Plan: Drink plenty fluids, BMP, CBC, UA, urine culture, PSA ER if unable to urinate or suprapubic pressure indicating possible obstruction. Start empiric antibiotics with Augmentin (allergic to sulfa, noted enlarged ascending aorta in PMH, avoid Cipro if possible). Consider stop Plavix temporarily if not better Tachycardia: Mild tachycardia without fever, MS changes or hypotension, unlikely to be septic. RTC 10 days.

## 2019-07-04 LAB — URINE CULTURE
MICRO NUMBER:: 778758
SPECIMEN QUALITY:: ADEQUATE

## 2019-07-07 ENCOUNTER — Other Ambulatory Visit: Payer: Self-pay | Admitting: Internal Medicine

## 2019-07-11 ENCOUNTER — Other Ambulatory Visit: Payer: Self-pay

## 2019-07-11 DIAGNOSIS — J3089 Other allergic rhinitis: Secondary | ICD-10-CM | POA: Diagnosis not present

## 2019-07-11 DIAGNOSIS — J301 Allergic rhinitis due to pollen: Secondary | ICD-10-CM | POA: Diagnosis not present

## 2019-07-12 ENCOUNTER — Ambulatory Visit (INDEPENDENT_AMBULATORY_CARE_PROVIDER_SITE_OTHER): Payer: Medicare Other | Admitting: Internal Medicine

## 2019-07-12 ENCOUNTER — Encounter: Payer: Self-pay | Admitting: Internal Medicine

## 2019-07-12 VITALS — BP 124/58 | HR 78 | Temp 96.8°F | Resp 16 | Ht 68.0 in | Wt 264.4 lb

## 2019-07-12 DIAGNOSIS — Z23 Encounter for immunization: Secondary | ICD-10-CM

## 2019-07-12 DIAGNOSIS — R319 Hematuria, unspecified: Secondary | ICD-10-CM | POA: Diagnosis not present

## 2019-07-12 DIAGNOSIS — N390 Urinary tract infection, site not specified: Secondary | ICD-10-CM | POA: Diagnosis not present

## 2019-07-12 DIAGNOSIS — E1142 Type 2 diabetes mellitus with diabetic polyneuropathy: Secondary | ICD-10-CM | POA: Diagnosis not present

## 2019-07-12 DIAGNOSIS — R399 Unspecified symptoms and signs involving the genitourinary system: Secondary | ICD-10-CM | POA: Diagnosis not present

## 2019-07-12 LAB — HEMOGLOBIN A1C: Hgb A1c MFr Bld: 7 % — ABNORMAL HIGH (ref 4.6–6.5)

## 2019-07-12 MED ORDER — AMOXICILLIN-POT CLAVULANATE 875-125 MG PO TABS: 1.0000 | ORAL_TABLET | Freq: Two times a day (BID) | ORAL | 0 refills | Status: DC

## 2019-07-12 MED ORDER — TAMSULOSIN HCL 0.4 MG PO CAPS: 0.4000 mg | ORAL_CAPSULE | Freq: Every day | ORAL | 3 refills | Status: DC

## 2019-07-12 MED FILL — AMOX-CLAV 875-125 MG TABLET: 875-125 | 4 days supply | Qty: 8 | Fill #0

## 2019-07-12 MED FILL — TAMSULOSIN HCL 0.4 MG CAP: 0.4 | 30 days supply | Qty: 30 | Fill #0

## 2019-07-12 NOTE — Assessment & Plan Note (Signed)
DM: Check A1c UTI: Was seen with gross hematuria, urine culture +, status post Augmentin for 10 days, will complete a 2-week treatment with 4 additional days. Hematuria quickly resolved. L UTS: He now has back to baseline symptoms, urinary frequency bothers him.  Has not seen urology in long time.  Had a  normal DRE 08-2018 . He likes further evaluation, plan: Start tamsulosin empirically, refer to urology. DJD: Patient reports has been off for left shoulder replacement due to persistent pain Preventive care: Flu shot today RTC 3 months CPX

## 2019-07-12 NOTE — Patient Instructions (Addendum)
GO TO THE LAB : Get the blood work     GO TO THE FRONT DESK Schedule your next appointment   for a physical exam in 3 months   Take 4 additional days of antibiotics, prescription sent  Start tamsulosin, and medication for your bladder

## 2019-07-12 NOTE — Progress Notes (Signed)
Subjective:    Patient ID: Ryan Zhang, male    DOB: 08/29/1946, 73 y.o.   MRN: [id number]  DOS:  07/12/2019 Type of visit - description: Follow-up UTI: Since the last visit he was treated with antibiotics, hematuria stop essentially the next day after the visit. Now LUTS symptoms are at baseline, still having urinary frequency every 90 minutes.   Review of Systems No fever chills No nausea, vomiting, diarrhea Urinary flow is back to normal but again he still have urinary frequency.  Past Medical History:  Diagnosis Date  . Allergy    allergy shots Dr. Velora Heckler  . Anemia   . Anxiety   . Asthma    moderate persistant  . Bipolar affective ([place])   . Cataract    both eyes  . COPD (chronic obstructive pulmonary disease) ([place])   . Depression   . Diabetes mellitus   . DJD (degenerative joint disease)   . Dysphagia   . Gallstones    hx of, s/p cholecystectomy  . GERD (gastroesophageal reflux disease)   . Hepatitis A    as teenager 43's  . Hollenhorst plaque    right eye  . Hyperlipidemia   . Hypertension   . Kidney stones    hx of  . Meniere's disease   . Motility disorder, esophageal   . Obesity   . Pancreatitis   . Personal history of colonic polyps 11/2004   hyperplastic.  . Stroke [facility])    per MRI    Past Surgical History:  Procedure Laterality Date  . CATARACT EXTRACTION Bilateral 09/2015 and 10/2015  . CHOLECYSTECTOMY    . COLONOSCOPY    . TOTAL KNEE ARTHROPLASTY Bilateral    both knees    Social History   Socioeconomic History  . Marital status: Married    Spouse name: Not on file  . Number of children: 1  . Years of education: 30  . Highest education level: Bachelor's degree (e.g., BA, AB, BS)  Occupational History  . Occupation: disabled    Employer: DISABLED    Comment: laboratory computing/programming  Social Needs  . Financial resource strain: Not hard at all  . Food insecurity    Worry: Never true    Inability: Never true  .  Transportation needs    Medical: No    Non-medical: No  Tobacco Use  . Smoking status: Former Smoker    Quit date: 11/15/1964    Years since quitting: 54.6  . Smokeless tobacco: Never Used  Substance and Sexual Activity  . Alcohol use: Not Currently  . Drug use: No  . Sexual activity: Not Currently  Lifestyle  . Physical activity    Days per week: 0 days    Minutes per session: 0 min  . Stress: Not at all  Relationships  . Social connections    Talks on phone: More than three times a week    Gets together: More than three times a week    Attends religious service: More than 4 times per year    Active member of club or organization: Yes    Attends meetings of clubs or organizations: 1 to 4 times per year    Relationship status: Married  . Intimate partner violence    Fear of current or ex partner: No    Emotionally abused: No    Physically abused: No    Forced sexual activity: No  Other Topics Concern  . Not on file  Social History Narrative  Household-- pt , wife, adopted  daughter (h/o substance abuse,on a methadone program, doing better )      Allergies as of 07/12/2019      Reactions   Celexa  [citalopram Hydrobromide] Other (See Comments)   Depakote  [divalproex Sodium] Other (See Comments)   Methocarbamol    Other reaction(s): Hallucinations   Paroxetine Hcl    Other reaction(s): Insomnia   Smz-tmp Ds [sulfamethoxazole-trimethoprim] Nausea And Vomiting   Divalproex Sodium Other (See Comments)   Pancreatitis    Methocarbamol Other (See Comments)   hallucinations   Other    "Symbrax" alteration in blood sugar, pt unsure if low or high blood sugar   Paroxetine Other (See Comments)   Insomnia   Percocet [oxycodone-acetaminophen] Other (See Comments)   Hyperactivity   Risperidone Other (See Comments)   REACTION: hyper   Wellbutrin [bupropion] Other (See Comments)   Hot flashes   Citalopram Hydrobromide Other (See Comments)   Insomnia      Medication List        Accurate as of July 12, 2019  9:49 AM. If you have any questions, ask your nurse or doctor.        albuterol 108 (90 Base) MCG/ACT inhaler Commonly known as: VENTOLIN HFA Inhale 2 puffs into the lungs every 6 (six) hours as needed for wheezing or shortness of breath.   albuterol (2.5 MG/3ML) 0.083% nebulizer solution Commonly known as: PROVENTIL Take 3 mLs (2.5 mg total) by nebulization every 6 (six) hours as needed for wheezing or shortness of breath.   amoxicillin-clavulanate 875-125 MG tablet Commonly known as: Augmentin Take 1 tablet by mouth 2 (two) times daily.   augmented betamethasone dipropionate 0.05 % ointment Commonly known as: DIPROLENE-AF Apply topically 2 (two) times daily as needed. What changed:   how much to take  reasons to take this   budesonide-formoterol 160-4.5 MCG/ACT inhaler Commonly known as: SYMBICORT Inhale 2 puffs into the lungs 2 (two) times daily.   clonazePAM 0.5 MG tablet Commonly known as: KLONOPIN Take 0.5 mg by mouth 2 (two) times daily. 1 mg in the morning and 0.5 mg in the evening.   clopidogrel 75 MG tablet Commonly known as: PLAVIX TAKE ONE TABLET BY MOUTH ONE TIME DAILY   clotrimazole-betamethasone cream Commonly known as: LOTRISONE Apply 1 application topically 2 (two) times daily.   Cymbalta 60 MG capsule Generic drug: DULoxetine Take 60 mg by mouth every morning.   diclofenac sodium 1 % Gel Commonly known as: VOLTAREN Apply 4 g topically 4 (four) times daily as needed.   EpiPen 2-Pak 0.3 mg/0.3 mL Soaj injection Generic drug: EPINEPHrine Reported on 04/29/2016   esomeprazole 40 MG capsule Commonly known as: NEXIUM Take 80 mg by mouth every morning.   fexofenadine 180 MG tablet Commonly known as: ALLEGRA Take 180 mg by mouth every morning.   glucose blood test strip Commonly known as: OneTouch Verio Check blood sugar no more than twice daily   lamoTRIgine 25 MG tablet Commonly known as: LAMICTAL  Take 50 mg by mouth at bedtime.   losartan 25 MG tablet Commonly known as: COZAAR Take 1 tablet (25 mg total) by mouth every evening.   multivitamin tablet Take 1 tablet by mouth daily.   onetouch ultrasoft lancets Check blood sugars no more than twice daily   pioglitazone-metformin 15-850 MG tablet Commonly known as: ACTOPLUS MET Take 1 tablet by mouth 2 (two) times daily with a meal.   Saphris 5 MG Subl 24 hr tablet  Generic drug: asenapine Place 5 mg under the tongue at bedtime.   simvastatin 40 MG tablet Commonly known as: ZOCOR TAKE ONE TABLET BY MOUTH AT BEDTIME   traZODone 50 MG tablet Commonly known as: DESYREL Take 50 mg by mouth at bedtime as needed for sleep.   Veramyst 27.5 MCG/SPRAY nasal spray Generic drug: fluticasone Place 2 sprays into the nose daily as needed for rhinitis or allergies.           Objective:   Physical Exam BP (!) 124/58 (BP Location: Left Arm, Patient Position: Sitting, Cuff Size: Normal)   Pulse 78   Temp (!) 96.8 F (36 C) (Temporal)   Resp 16   Ht 5' 8"  (1.727 m)   Wt 264 lb 6 oz (119.9 kg)   SpO2 97%   BMI 40.20 kg/m  General:   Well developed, NAD, BMI noted.  HEENT:  Normocephalic . Face symmetric, atraumatic Lungs:  CTA B Normal respiratory effort, no intercostal retractions, no accessory muscle use. Heart: RRR,  no murmur.  no pretibial edema bilaterally  Abdomen:  Not distended, soft, non-tender. No rebound or rigidity.   Skin: Not pale. Not jaundice Neurologic:  alert & oriented X3.  Speech normal, gait appropriate for age and unassisted Psych--  Cognition and judgment appear intact.  Cooperative with normal attention span and concentration.  Behavior appropriate. No anxious or depressed appearing.     Assessment      Assessment DM HTN Hyperlipidemia Morbid obesity (BMI 39 plus DM) Bipolar, Depression ------ see psychiatry, Dr Casimiro Needle DJD-- hydrocodone rarely rx by [place] ortho Asthma-Allergies  ------------ Dr [name]  GERD, h/o dysphagia (chronic, neurogenic-transfer dysphagia? See GI note 12-2011) Neuro: --? stroke : saw neuro 2013 d/t B transient visual loss, ASA changed to plavix.  --See CTA, MRIs of head-neck report  --Saw neuro 11-2013: fall, syncope,parkinsonism d/t sapharis? CV: Enlarged ascending Ao  --per CT 12-2015,CT chest 08-2016 stable.    --MRI chest 10/2017 stable, no further routine x-rays recommended --CT chest 07/2018: Atherosclerotic changes in the thoracic aorta.  Nl LE arterial dopplers 2015 Carotid artery disease: Per ultrasound 2011, last [place] 2018: <50% B, rx medical treatment, recheck 2 years  CT chest  ---RML  15m: 07-2014, CT 12-2015, CT 08-2016, MRI chest 12,2018, CT chest 07/2018:Stable  HOH H/o Mnire's disease  Iron deficiency anemia: Noted 01/2018, + Hemoccult, 09-2018: Colonoscopy and EGD were completely normal   PLAN: DM: Check A1c UTI: Was seen with gross hematuria, urine culture +, status post Augmentin for 10 days, will complete a 2-week treatment with 4 additional days. Hematuria quickly resolved. L UTS: He now has back to baseline symptoms, urinary frequency bothers him.  Has not seen urology in long time.  Had a  normal DRE 08-2018 . He likes further evaluation, plan: Start tamsulosin empirically, refer to urology. DJD: Patient reports has been off for left shoulder replacement due to persistent pain Preventive care: Flu shot today RTC 3 months CPX

## 2019-07-12 NOTE — Progress Notes (Signed)
Pre visit review using our clinic review tool, if applicable. No additional management support is needed unless otherwise documented below in the visit note. 

## 2019-07-14 ENCOUNTER — Other Ambulatory Visit: Payer: Self-pay | Admitting: Internal Medicine

## 2019-07-16 ENCOUNTER — Encounter: Payer: Self-pay | Admitting: Internal Medicine

## 2019-07-17 DIAGNOSIS — J301 Allergic rhinitis due to pollen: Secondary | ICD-10-CM | POA: Diagnosis not present

## 2019-07-17 DIAGNOSIS — J3089 Other allergic rhinitis: Secondary | ICD-10-CM | POA: Diagnosis not present

## 2019-07-18 DIAGNOSIS — J4540 Moderate persistent asthma, uncomplicated: Secondary | ICD-10-CM | POA: Diagnosis not present

## 2019-07-18 DIAGNOSIS — J3089 Other allergic rhinitis: Secondary | ICD-10-CM | POA: Diagnosis not present

## 2019-07-18 DIAGNOSIS — J300 Vasomotor rhinitis: Secondary | ICD-10-CM | POA: Diagnosis not present

## 2019-07-18 DIAGNOSIS — J301 Allergic rhinitis due to pollen: Secondary | ICD-10-CM | POA: Diagnosis not present

## 2019-07-25 DIAGNOSIS — J3089 Other allergic rhinitis: Secondary | ICD-10-CM | POA: Diagnosis not present

## 2019-07-25 DIAGNOSIS — J301 Allergic rhinitis due to pollen: Secondary | ICD-10-CM | POA: Diagnosis not present

## 2019-07-27 ENCOUNTER — Encounter: Payer: Self-pay | Admitting: Internal Medicine

## 2019-07-28 ENCOUNTER — Encounter: Payer: Self-pay | Admitting: Internal Medicine

## 2019-07-30 MED ORDER — TAMSULOSIN HCL 0.4 MG PO CAPS: 0.4000 mg | ORAL_CAPSULE | Freq: Every day | ORAL | 1 refills | Status: DC

## 2019-08-01 DIAGNOSIS — J301 Allergic rhinitis due to pollen: Secondary | ICD-10-CM | POA: Diagnosis not present

## 2019-08-01 DIAGNOSIS — J3089 Other allergic rhinitis: Secondary | ICD-10-CM | POA: Diagnosis not present

## 2019-08-03 DIAGNOSIS — J301 Allergic rhinitis due to pollen: Secondary | ICD-10-CM | POA: Diagnosis not present

## 2019-08-03 DIAGNOSIS — J3089 Other allergic rhinitis: Secondary | ICD-10-CM | POA: Diagnosis not present

## 2019-08-08 DIAGNOSIS — J3089 Other allergic rhinitis: Secondary | ICD-10-CM | POA: Diagnosis not present

## 2019-08-08 DIAGNOSIS — J301 Allergic rhinitis due to pollen: Secondary | ICD-10-CM | POA: Diagnosis not present

## 2019-08-10 DIAGNOSIS — J3089 Other allergic rhinitis: Secondary | ICD-10-CM | POA: Diagnosis not present

## 2019-08-10 DIAGNOSIS — J301 Allergic rhinitis due to pollen: Secondary | ICD-10-CM | POA: Diagnosis not present

## 2019-08-15 DIAGNOSIS — J301 Allergic rhinitis due to pollen: Secondary | ICD-10-CM | POA: Diagnosis not present

## 2019-08-15 DIAGNOSIS — J3089 Other allergic rhinitis: Secondary | ICD-10-CM | POA: Diagnosis not present

## 2019-08-22 DIAGNOSIS — J301 Allergic rhinitis due to pollen: Secondary | ICD-10-CM | POA: Diagnosis not present

## 2019-08-22 DIAGNOSIS — J3089 Other allergic rhinitis: Secondary | ICD-10-CM | POA: Diagnosis not present

## 2019-08-26 ENCOUNTER — Telehealth: Payer: Self-pay | Admitting: Internal Medicine

## 2019-08-26 DIAGNOSIS — I779 Disorder of arteries and arterioles, unspecified: Secondary | ICD-10-CM

## 2019-08-26 NOTE — Telephone Encounter (Signed)
Please arrange for a carotid ultrasound, DX carotid artery disease  (test was delayed by covid- 19)

## 2019-08-27 NOTE — Telephone Encounter (Signed)
Order placed

## 2019-08-28 ENCOUNTER — Encounter: Payer: Self-pay | Admitting: Internal Medicine

## 2019-08-29 DIAGNOSIS — J3089 Other allergic rhinitis: Secondary | ICD-10-CM | POA: Diagnosis not present

## 2019-08-29 DIAGNOSIS — J301 Allergic rhinitis due to pollen: Secondary | ICD-10-CM | POA: Diagnosis not present

## 2019-08-31 ENCOUNTER — Encounter (HOSPITAL_COMMUNITY): Payer: Medicare Other

## 2019-09-03 ENCOUNTER — Other Ambulatory Visit: Payer: Self-pay

## 2019-09-03 ENCOUNTER — Ambulatory Visit (HOSPITAL_COMMUNITY)
Admission: RE | Admit: 2019-09-03 | Discharge: 2019-09-03 | Disposition: A | Discharge status: Home / Self Care | Payer: Medicare Other | Source: Ambulatory Visit | Attending: Cardiology | Admitting: Cardiology

## 2019-09-03 DIAGNOSIS — I779 Disorder of arteries and arterioles, unspecified: Secondary | ICD-10-CM | POA: Insufficient documentation

## 2019-09-05 DIAGNOSIS — J3089 Other allergic rhinitis: Secondary | ICD-10-CM | POA: Diagnosis not present

## 2019-09-05 DIAGNOSIS — J301 Allergic rhinitis due to pollen: Secondary | ICD-10-CM | POA: Diagnosis not present

## 2019-09-12 DIAGNOSIS — J3089 Other allergic rhinitis: Secondary | ICD-10-CM | POA: Diagnosis not present

## 2019-09-12 DIAGNOSIS — J301 Allergic rhinitis due to pollen: Secondary | ICD-10-CM | POA: Diagnosis not present

## 2019-09-19 DIAGNOSIS — J3089 Other allergic rhinitis: Secondary | ICD-10-CM | POA: Diagnosis not present

## 2019-09-19 DIAGNOSIS — J301 Allergic rhinitis due to pollen: Secondary | ICD-10-CM | POA: Diagnosis not present

## 2019-09-20 ENCOUNTER — Other Ambulatory Visit: Payer: Self-pay

## 2019-09-20 ENCOUNTER — Encounter (INDEPENDENT_AMBULATORY_CARE_PROVIDER_SITE_OTHER): Payer: Self-pay | Admitting: Otolaryngology

## 2019-09-20 ENCOUNTER — Ambulatory Visit (INDEPENDENT_AMBULATORY_CARE_PROVIDER_SITE_OTHER): Payer: Medicare Other | Admitting: Otolaryngology

## 2019-09-20 VITALS — Temp 97.6°F

## 2019-09-20 DIAGNOSIS — H6121 Impacted cerumen, right ear: Secondary | ICD-10-CM | POA: Diagnosis not present

## 2019-09-20 NOTE — Progress Notes (Signed)
HPI: Ryan Zhang is a 73 y.o. male who presents for evaluation of wax impaction in his right ear. Patient wears bilateral hearing aids and has noticed decreased hearing in the past couple weeks. He states that he only hears out of his right ear so when this ear has something in it, he is unable to hear at all. He denies any ear pain, ear drainage  Past Medical History:  Diagnosis Date  . Allergy    allergy shots Dr. Velora Heckler  . Anemia   . Anxiety   . Asthma    moderate persistant  . Bipolar affective ([place])   . Cataract    both eyes  . COPD (chronic obstructive pulmonary disease) ([place])   . Depression   . Diabetes mellitus   . DJD (degenerative joint disease)   . Dysphagia   . Gallstones    hx of, s/p cholecystectomy  . GERD (gastroesophageal reflux disease)   . Hepatitis A    as teenager 56's  . Hollenhorst plaque    right eye  . Hyperlipidemia   . Hypertension   . Kidney stones    hx of  . Meniere's disease   . Motility disorder, esophageal   . Obesity   . Pancreatitis   . Personal history of colonic polyps 11/2004   hyperplastic.  . Stroke [facility])    per MRI   Past Surgical History:  Procedure Laterality Date  . CATARACT EXTRACTION Bilateral 09/2015 and 10/2015  . CHOLECYSTECTOMY    . COLONOSCOPY    . TOTAL KNEE ARTHROPLASTY Bilateral    both knees   Social History   Socioeconomic History  . Marital status: Married    Spouse name: Not on file  . Number of children: 1  . Years of education: 77  . Highest education level: Bachelor's degree (e.g., BA, AB, BS)  Occupational History  . Occupation: disabled    Employer: DISABLED    Comment: laboratory computing/programming  Social Needs  . Financial resource strain: Not hard at all  . Food insecurity    Worry: Never true    Inability: Never true  . Transportation needs    Medical: No    Non-medical: No  Tobacco Use  . Smoking status: Never Smoker  . Smokeless tobacco: Never Used  Substance and Sexual  Activity  . Alcohol use: Not Currently  . Drug use: No  . Sexual activity: Not Currently  Lifestyle  . Physical activity    Days per week: 0 days    Minutes per session: 0 min  . Stress: Not at all  Relationships  . Social connections    Talks on phone: More than three times a week    Gets together: More than three times a week    Attends religious service: More than 4 times per year    Active member of club or organization: Yes    Attends meetings of clubs or organizations: 1 to 4 times per year    Relationship status: Married  Other Topics Concern  . Not on file  Social History Narrative   Household-- pt , wife, adopted  daughter (h/o substance abuse,on a methadone program, doing better )   Family History  Problem Relation Age of Onset  . Diabetes Father   . Heart disease Brother   . Heart disease Maternal Grandmother   . Cancer Sister        unknown type  . Colon cancer Neg Hx   . Prostate cancer Neg  Hx   . Colon polyps Neg Hx   . Esophageal cancer Neg Hx   . Rectal cancer Neg Hx   . Stomach cancer Neg Hx    Allergies  Allergen Reactions  . Celexa  [Citalopram Hydrobromide] Other (See Comments)  . Depakote  [Divalproex Sodium] Other (See Comments)  . Methocarbamol     Other reaction(s): Hallucinations  . Paroxetine Hcl     Other reaction(s): Insomnia  . Smz-Tmp Ds [Sulfamethoxazole-Trimethoprim] Nausea And Vomiting  . Divalproex Sodium Other (See Comments)    Pancreatitis   . Methocarbamol Other (See Comments)    hallucinations  . Other     "Symbrax" alteration in blood sugar, pt unsure if low or high blood sugar  . Paroxetine Other (See Comments)    Insomnia  . Percocet [Oxycodone-Acetaminophen] Other (See Comments)    Hyperactivity  . Risperidone Other (See Comments)    REACTION: hyper  . Wellbutrin [Bupropion] Other (See Comments)    Hot flashes  . Citalopram Hydrobromide Other (See Comments)    Insomnia   Prior to Admission medications    Medication Sig Start Date End Date Taking? Authorizing Provider  albuterol (PROVENTIL HFA;VENTOLIN HFA) 108 (90 Base) MCG/ACT inhaler Inhale 2 puffs into the lungs every 6 (six) hours as needed for wheezing or shortness of breath. 01/24/18   Colon Branch, MD  albuterol (PROVENTIL) (2.5 MG/3ML) 0.083% nebulizer solution Take 3 mLs (2.5 mg total) by nebulization every 6 (six) hours as needed for wheezing or shortness of breath. 01/24/18   Colon Branch, MD  amoxicillin-clavulanate (AUGMENTIN) 875-125 MG tablet Take 1 tablet by mouth 2 (two) times daily. 07/12/19   Colon Branch, MD  asenapine (SAPHRIS) 5 MG SUBL Place 5 mg under the tongue at bedtime.     [provider]  augmented betamethasone dipropionate (DIPROLENE-AF) 0.05 % ointment Apply topically 2 (two) times daily as needed. Patient taking differently: Apply 1 application topically 2 (two) times daily as needed (yeast infection).  03/13/18   Colon Branch, MD  budesonide-formoterol [facility]) 160-4.5 MCG/ACT inhaler Inhale 2 puffs into the lungs 2 (two) times daily.     [provider]  clonazePAM (KLONOPIN) 0.5 MG tablet Take 0.5 mg by mouth 2 (two) times daily. 1 mg in the morning and 0.5 mg in the evening.    [provider]  clopidogrel (PLAVIX) 75 MG tablet TAKE ONE TABLET BY MOUTH ONE TIME DAILY  05/30/19   Colon Branch, MD  clotrimazole-betamethasone (LOTRISONE) cream Apply 1 application topically 2 (two) times daily. 05/23/17   Colon Branch, MD  diclofenac sodium (VOLTAREN) 1 % GEL Apply 4 g topically 4 (four) times daily as needed. Patient not taking: Reported on 07/12/2019 01/01/19   Colon Branch, MD  DULoxetine (CYMBALTA) 60 MG capsule Take 60 mg by mouth every morning.     [provider]  EPIPEN 2-PAK 0.3 MG/0.3ML SOAJ injection Reported on 04/29/2016 07/04/14   [provider]  esomeprazole (NEXIUM) 40 MG capsule Take 80 mg by mouth every morning.     [provider]  fexofenadine (ALLEGRA)  180 MG tablet Take 180 mg by mouth every morning.     [provider]  fluticasone (VERAMYST) 27.5 MCG/SPRAY nasal spray Place 2 sprays into the nose daily as needed for rhinitis or allergies.     [provider]  glucose blood (ONETOUCH VERIO) test strip Check blood sugar no more than twice daily 12/12/17   Kathlene November  E, MD  lamoTRIgine (LAMICTAL) 25 MG tablet Take 50 mg by mouth at bedtime.    [provider]  Lancets [name] ULTRASOFT) lancets Check blood sugars no more than twice daily 12/09/16   Colon Branch, MD  losartan (COZAAR) 25 MG tablet Take 1 tablet (25 mg total) by mouth every evening. 07/09/19   Colon Branch, MD  Multiple Vitamin (MULTIVITAMIN) tablet Take 1 tablet by mouth daily.     [provider]  pioglitazone-metformin (ACTOPLUS MET) 15-850 MG tablet Take 1 tablet by mouth 2 (two) times daily with a meal. 05/21/19   Colon Branch, MD  simvastatin (ZOCOR) 40 MG tablet Take 1 tablet (40 mg total) by mouth at bedtime. 07/16/19   Colon Branch, MD  tamsulosin (FLOMAX) 0.4 MG CAPS capsule Take 1 capsule (0.4 mg total) by mouth daily after supper. 07/30/19   Colon Branch, MD  traZODone (DESYREL) 50 MG tablet Take 50 mg by mouth at bedtime as needed for sleep.  07/09/14   Midge Minium, MD     Positive ROS: positive for hearing loss; negative for ear pain, ear drainage  All other systems have been reviewed and were otherwise negative with the exception of those mentioned in the HPI and as above.  Physical Exam: General: Alert, no acute distress Ears: Ear canals are completely obstructed with wax bilaterally. After cleaning, EACs are clear with clear, intact TMs bilaterally Neck: No palpable adenopathy or masses  Cerumen impaction removal  Date/Time: 09/20/2019 3:38 PM Performed by: Lucilla Edin, PA-C Authorized by: Rozetta Nunnery, MD   Consent:    Consent obtained:  Verbal   Consent given by:  Patient   Risks discussed:  Pain    Alternatives discussed:  No treatment Procedure details:    Location:  R ear   Procedure type: suction and forceps   Post-procedure details:    Inspection:  TM intact and canal normal   Hearing quality:  Improved   Patient tolerance of procedure:  Tolerated well, no immediate complications    Assessment: Cerumen impaction, right ear   Plan: Cerumen removed in office with subjective improvement in symptoms. He will return as needed.  Christin Hoffstadt, PA-C   I agree with the assessment and plan as outlined above. Radene Journey, MD

## 2019-09-24 ENCOUNTER — Ambulatory Visit (INDEPENDENT_AMBULATORY_CARE_PROVIDER_SITE_OTHER): Payer: Medicare Other | Admitting: Otolaryngology

## 2019-09-28 DIAGNOSIS — J3089 Other allergic rhinitis: Secondary | ICD-10-CM | POA: Diagnosis not present

## 2019-09-28 DIAGNOSIS — J301 Allergic rhinitis due to pollen: Secondary | ICD-10-CM | POA: Diagnosis not present

## 2019-10-03 DIAGNOSIS — J301 Allergic rhinitis due to pollen: Secondary | ICD-10-CM | POA: Diagnosis not present

## 2019-10-03 DIAGNOSIS — J3089 Other allergic rhinitis: Secondary | ICD-10-CM | POA: Diagnosis not present

## 2019-10-10 DIAGNOSIS — J301 Allergic rhinitis due to pollen: Secondary | ICD-10-CM | POA: Diagnosis not present

## 2019-10-10 DIAGNOSIS — J3089 Other allergic rhinitis: Secondary | ICD-10-CM | POA: Diagnosis not present

## 2019-10-17 DIAGNOSIS — J301 Allergic rhinitis due to pollen: Secondary | ICD-10-CM | POA: Diagnosis not present

## 2019-10-17 DIAGNOSIS — J3089 Other allergic rhinitis: Secondary | ICD-10-CM | POA: Diagnosis not present

## 2019-10-24 DIAGNOSIS — J3089 Other allergic rhinitis: Secondary | ICD-10-CM | POA: Diagnosis not present

## 2019-10-24 DIAGNOSIS — J301 Allergic rhinitis due to pollen: Secondary | ICD-10-CM | POA: Diagnosis not present

## 2019-11-02 DIAGNOSIS — J3089 Other allergic rhinitis: Secondary | ICD-10-CM | POA: Diagnosis not present

## 2019-11-02 DIAGNOSIS — J301 Allergic rhinitis due to pollen: Secondary | ICD-10-CM | POA: Diagnosis not present

## 2019-11-03 ENCOUNTER — Other Ambulatory Visit: Payer: Self-pay | Admitting: Internal Medicine

## 2019-11-07 ENCOUNTER — Ambulatory Visit (HOSPITAL_BASED_OUTPATIENT_CLINIC_OR_DEPARTMENT_OTHER)
Admission: RE | Admit: 2019-11-07 | Discharge: 2019-11-07 | Disposition: A | Discharge status: Home / Self Care | Payer: Medicare Other | Source: Ambulatory Visit | Attending: Internal Medicine | Admitting: Internal Medicine

## 2019-11-07 ENCOUNTER — Ambulatory Visit (INDEPENDENT_AMBULATORY_CARE_PROVIDER_SITE_OTHER): Payer: Medicare Other | Admitting: Internal Medicine

## 2019-11-07 ENCOUNTER — Other Ambulatory Visit: Payer: Self-pay

## 2019-11-07 ENCOUNTER — Encounter: Payer: Self-pay | Admitting: Internal Medicine

## 2019-11-07 VITALS — BP 145/78 | HR 102 | Temp 97.5°F | Ht 68.0 in | Wt 257.4 lb

## 2019-11-07 DIAGNOSIS — R05 Cough: Secondary | ICD-10-CM

## 2019-11-07 DIAGNOSIS — J189 Pneumonia, unspecified organism: Secondary | ICD-10-CM | POA: Diagnosis not present

## 2019-11-07 DIAGNOSIS — R059 Cough, unspecified: Secondary | ICD-10-CM

## 2019-11-07 MED ORDER — PREDNISONE 20 MG PO TABS: 20.0000 mg | ORAL_TABLET | Freq: Every day | ORAL | 0 refills | Status: DC

## 2019-11-07 MED ORDER — AZITHROMYCIN 250 MG PO TABS: ORAL_TABLET | ORAL | 0 refills | Status: DC

## 2019-11-07 NOTE — Progress Notes (Signed)
Pre visit review using our clinic review tool, if applicable. No additional management support is needed unless otherwise documented below in the visit note. 

## 2019-11-07 NOTE — Progress Notes (Signed)
Subjective:    Patient ID: Ryan Zhang, male    DOB: [birth year], 73 y.o.   MRN: [id number]  DOS:  11/07/2019 Type of visit - description: Virtual Visit via Video Note  I connected with the above patient  by a video enabled telemedicine application and verified that I am speaking with the correct person using two identifiers.   THIS ENCOUNTER IS A VIRTUAL VISIT DUE TO COVID-19 - PATIENT WAS NOT SEEN IN THE OFFICE. PATIENT HAS CONSENTED TO VIRTUAL VISIT / TELEMEDICINE VISIT   Location of patient: home  Location of provider: office  I discussed the limitations of evaluation and management by telemedicine and the availability of in person appointments. The patient expressed understanding and agreed to proceed.  History of Present Illness: Acute Symptoms started 2-1/2 weeks ago with nasal congestion, nasal discharge with mucus and some blood. He gradually got better however 3 days ago he developed a dry cough. He heard some wheezing a couple of times Denies chest pain no difficulty breathing O2 sat was 91% as soon as he finished taking the trash out however now that he is resting and talking to me is 97%    Review of Systems  No fever chills No edema of the lower extremities or palpitations No nausea, vomiting, diarrhea No myalgias Admits to mild headache.  Past Medical History:  Diagnosis Date  . Allergy    allergy shots Dr. Velora Heckler  . Anemia   . Anxiety   . Asthma    moderate persistant  . Bipolar affective ([place])   . Cataract    both eyes  . COPD (chronic obstructive pulmonary disease) ([place])   . Depression   . Diabetes mellitus   . DJD (degenerative joint disease)   . Dysphagia   . Gallstones    hx of, s/p cholecystectomy  . GERD (gastroesophageal reflux disease)   . Hepatitis A    as teenager 45's  . Hollenhorst plaque    right eye  . Hyperlipidemia   . Hypertension   . Kidney stones    hx of  . Meniere's disease   . Motility disorder, esophageal   .  Obesity   . Pancreatitis   . Personal history of colonic polyps 11/2004   hyperplastic.  . Stroke [facility])    per MRI    Past Surgical History:  Procedure Laterality Date  . CATARACT EXTRACTION Bilateral 09/2015 and 10/2015  . CHOLECYSTECTOMY    . COLONOSCOPY    . TOTAL KNEE ARTHROPLASTY Bilateral    both knees    Social History   Socioeconomic History  . Marital status: Married    Spouse name: Not on file  . Number of children: 1  . Years of education: 60  . Highest education level: Bachelor's degree (e.g., BA, AB, BS)  Occupational History  . Occupation: disabled    Employer: DISABLED    Comment: laboratory computing/programming  Tobacco Use  . Smoking status: Never Smoker  . Smokeless tobacco: Never Used  Substance and Sexual Activity  . Alcohol use: Not Currently  . Drug use: No  . Sexual activity: Not Currently  Other Topics Concern  . Not on file  Social History Narrative   Household-- pt , wife, adopted  daughter (h/o substance abuse,on a methadone program, doing better )   Social Determinants of Health   Financial Resource Strain:   . Difficulty of Paying Living Expenses: Not on file  Food Insecurity:   . Worried About Estate manager/land agent  of Food in the Last Year: Not on file  . Ran Out of Food in the Last Year: Not on file  Transportation Needs:   . Lack of Transportation (Medical): Not on file  . Lack of Transportation (Non-Medical): Not on file  Physical Activity:   . Days of Exercise per Week: Not on file  . Minutes of Exercise per Session: Not on file  Stress:   . Feeling of Stress : Not on file  Social Connections:   . Frequency of Communication with Friends and Family: Not on file  . Frequency of Social Gatherings with Friends and Family: Not on file  . Attends Religious Services: Not on file  . Active Member of Clubs or Organizations: Not on file  . Attends Archivist Meetings: Not on file  . Marital Status: Not on file  Intimate Partner  Violence:   . Fear of Current or Ex-Partner: Not on file  . Emotionally Abused: Not on file  . Physically Abused: Not on file  . Sexually Abused: Not on file      Allergies as of 11/07/2019      Reactions   Celexa  [citalopram Hydrobromide] Other (See Comments)   Depakote  [divalproex Sodium] Other (See Comments)   Methocarbamol    Other reaction(s): Hallucinations   Paroxetine Hcl    Other reaction(s): Insomnia   Smz-tmp Ds [sulfamethoxazole-trimethoprim] Nausea And Vomiting   Divalproex Sodium Other (See Comments)   Pancreatitis    Methocarbamol Other (See Comments)   hallucinations   Other    "Symbrax" alteration in blood sugar, pt unsure if low or high blood sugar   Paroxetine Other (See Comments)   Insomnia   Percocet [oxycodone-acetaminophen] Other (See Comments)   Hyperactivity   Risperidone Other (See Comments)   REACTION: hyper   Wellbutrin [bupropion] Other (See Comments)   Hot flashes   Citalopram Hydrobromide Other (See Comments)   Insomnia      Medication List       Accurate as of November 07, 2019  8:43 AM. If you have any questions, ask your nurse or doctor.        albuterol 108 (90 Base) MCG/ACT inhaler Commonly known as: VENTOLIN HFA Inhale 2 puffs into the lungs every 6 (six) hours as needed for wheezing or shortness of breath.   albuterol (2.5 MG/3ML) 0.083% nebulizer solution Commonly known as: PROVENTIL Take 3 mLs (2.5 mg total) by nebulization every 6 (six) hours as needed for wheezing or shortness of breath.   amoxicillin-clavulanate 875-125 MG tablet Commonly known as: Augmentin Take 1 tablet by mouth 2 (two) times daily.   augmented betamethasone dipropionate 0.05 % ointment Commonly known as: DIPROLENE-AF Apply topically 2 (two) times daily as needed. What changed:   how much to take  reasons to take this   budesonide-formoterol 160-4.5 MCG/ACT inhaler Commonly known as: SYMBICORT Inhale 2 puffs into the lungs 2 (two) times  daily.   clonazePAM 0.5 MG tablet Commonly known as: KLONOPIN Take 0.5 mg by mouth 2 (two) times daily. 1 mg in the morning and 0.5 mg in the evening.   clopidogrel 75 MG tablet Commonly known as: PLAVIX Take 1 tablet (75 mg total) by mouth daily.   clotrimazole-betamethasone cream Commonly known as: LOTRISONE Apply 1 application topically 2 (two) times daily.   Cymbalta 60 MG capsule Generic drug: DULoxetine Take 60 mg by mouth every morning.   diclofenac sodium 1 % Gel Commonly known as: VOLTAREN Apply 4 g topically  4 (four) times daily as needed.   EpiPen 2-Pak 0.3 mg/0.3 mL Soaj injection Generic drug: EPINEPHrine Reported on 04/29/2016   esomeprazole 40 MG capsule Commonly known as: NEXIUM Take 80 mg by mouth every morning.   fexofenadine 180 MG tablet Commonly known as: ALLEGRA Take 180 mg by mouth every morning.   glucose blood test strip Commonly known as: OneTouch Verio Check blood sugar no more than twice daily   lamoTRIgine 25 MG tablet Commonly known as: LAMICTAL Take 50 mg by mouth at bedtime.   losartan 25 MG tablet Commonly known as: COZAAR Take 1 tablet (25 mg total) by mouth every evening.   multivitamin tablet Take 1 tablet by mouth daily.   onetouch ultrasoft lancets Check blood sugars no more than twice daily   pioglitazone-metformin 15-850 MG tablet Commonly known as: ACTOPLUS MET Take 1 tablet by mouth 2 (two) times daily with a meal.   Saphris 5 MG Subl 24 hr tablet Generic drug: asenapine Place 5 mg under the tongue at bedtime.   simvastatin 40 MG tablet Commonly known as: ZOCOR Take 1 tablet (40 mg total) by mouth at bedtime.   solifenacin 10 MG tablet Commonly known as: VESICARE Take 10 mg by mouth daily.   tamsulosin 0.4 MG Caps capsule Commonly known as: FLOMAX Take 1 capsule (0.4 mg total) by mouth daily after supper.   traZODone 50 MG tablet Commonly known as: DESYREL Take 50 mg by mouth at bedtime as needed for  sleep.   Veramyst 27.5 MCG/SPRAY nasal spray Generic drug: fluticasone Place 2 sprays into the nose daily as needed for rhinitis or allergies.           Objective:   Physical Exam BP (!) 145/78   Pulse (!) 102   Temp (!) 97.5 F (36.4 C) (Oral)   Ht 5' 8"  (1.727 m)   Wt 257 lb 6 oz (116.7 kg)   SpO2 91%   BMI 39.13 kg/m  This is a virtual video visit, he is alert oriented x3, in no apparent distress, speaking in complete sentences    Assessment      Assessment DM HTN Hyperlipidemia Morbid obesity (BMI 39 plus DM) Bipolar, Depression ------ see psychiatry, Dr Casimiro Needle DJD-- hydrocodone rarely rx by [place] orth[facility] Asthma-Allergies ------------ Dr [name]  GERD, h/o dysphagia (chronic, neurogenic-transfer dysphagia? See GI note 12-2011) Neuro: --? stroke : saw neuro 2013 d/t B transient visual loss, ASA changed to plavix.  --See CTA, MRIs of head-neck report  --Saw neuro 11-2013: fall, syncope,parkinsonism d/t sapharis? CV: Enlarged ascending Ao  --per CT 12-2015,CT chest 08-2016 stable.    --MRI chest 10/2017 stable, no further routine x-rays recommended --CT chest 07/2018: Atherosclerotic changes in the thoracic aorta.  Nl LE arterial dopplers 2015 Carotid artery disease: Per ultrasound 2011, last [place] 2018: <50% B, rx medical treatment, recheck 2 years  CT chest  ---RML  29m: 07-2014, CT 12-2015, CT 08-2016, MRI chest 12,2018, CT chest 07/2018:Stable  HOH H/o Mnire's disease  Iron deficiency anemia: Noted 01/2018, + Hemoccult, 09-2018: Colonoscopy and EGD were completely normal   PLAN: Bronchitis: 73year old gentleman with history of asthma having respiratory symptoms lately, mostly cough, occasional wheezing, shortness of breath is chronic and at baseline. He did have a O2 sat immediately after exertion at 91%, recheck 97%. Plan: Chest x-ray, start Z-Pak, start prednisone in 2 to 3 days if not better. Continue monitoring O2 sats, if they are consistently less than 94%  recommend ER. Mucinex DM  Addendum: Chest x-ray early patchy bilateral opacities concerning for early pneumonia.  Patient aware, plan is the same, will also check for Covid. Reschedule CPX for next month (was a scheduled for today).     I discussed the assessment and treatment plan with the patient. The patient was provided an opportunity to ask questions and all were answered. The patient agreed with the plan and demonstrated an understanding of the instructions.   The patient was advised to call back or seek an in-person evaluation if the symptoms worsen or if the condition fails to improve as anticipated.

## 2019-11-08 ENCOUNTER — Ambulatory Visit: Payer: Medicare Other | Attending: Internal Medicine

## 2019-11-08 ENCOUNTER — Telehealth: Payer: Self-pay

## 2019-11-08 DIAGNOSIS — U071 COVID-19: Secondary | ICD-10-CM | POA: Diagnosis not present

## 2019-11-08 DIAGNOSIS — R238 Other skin changes: Secondary | ICD-10-CM | POA: Diagnosis not present

## 2019-11-08 NOTE — Assessment & Plan Note (Signed)
Bronchitis: 73 year old gentleman with history of asthma having respiratory symptoms lately, mostly cough, occasional wheezing, shortness of breath is chronic and at baseline. He did have a O2 sat immediately after exertion at 91%, recheck 97%. Plan: Chest x-ray, start Z-Pak, start prednisone in 2 to 3 days if not better. Continue monitoring O2 sats, if they are consistently less than 94% recommend ER. Mucinex DM Addendum: Chest x-ray early patchy bilateral opacities concerning for early pneumonia.  Patient aware, plan is the same, will also check for Covid. Reschedule CPX for next month (was a scheduled for today).

## 2019-11-08 NOTE — Telephone Encounter (Signed)
Spoke w/ Pt- further questions answered.

## 2019-11-09 LAB — NOVEL CORONAVIRUS, NAA: SARS-CoV-2, NAA: NOT DETECTED

## 2019-11-10 ENCOUNTER — Other Ambulatory Visit: Payer: Self-pay | Admitting: Internal Medicine

## 2019-11-11 ENCOUNTER — Encounter: Payer: Self-pay | Admitting: Internal Medicine

## 2019-11-12 ENCOUNTER — Encounter: Payer: Self-pay | Admitting: Internal Medicine

## 2019-11-12 ENCOUNTER — Ambulatory Visit (INDEPENDENT_AMBULATORY_CARE_PROVIDER_SITE_OTHER): Payer: Medicare Other | Admitting: Internal Medicine

## 2019-11-12 ENCOUNTER — Other Ambulatory Visit: Payer: Self-pay

## 2019-11-12 VITALS — HR 95 | Ht 69.0 in | Wt 260.0 lb

## 2019-11-12 DIAGNOSIS — B356 Tinea cruris: Secondary | ICD-10-CM | POA: Diagnosis not present

## 2019-11-12 MED ORDER — KETOCONAZOLE 2 % EX CREA: 1.0000 "application " | TOPICAL_CREAM | Freq: Every day | CUTANEOUS | 0 refills | Status: DC

## 2019-11-12 NOTE — Progress Notes (Signed)
Subjective:    Patient ID: Ryan Zhang, male    DOB: Apr 01, 1946, 73 y.o.   MRN: [id number]  DOS:  11/12/2019 Type of visit - description: Virtual Visit via Video Note  I connected with the above patient  by a video enabled telemedicine application and verified that I am speaking with the correct person using two identifiers.   THIS ENCOUNTER IS A VIRTUAL VISIT DUE TO COVID-19 - PATIENT WAS NOT SEEN IN THE OFFICE. PATIENT HAS CONSENTED TO VIRTUAL VISIT / TELEMEDICINE VISIT   Location of patient: home  Location of provider: office  I discussed the limitations of evaluation and management by telemedicine and the availability of in person appointments. The patient expressed understanding and agreed to proceed.  History of Present Illness: Acute Has bilateral groin rash for 1 week, is using betamethasone and clotrimazole without much success. The area is red and sore. No  discharge or pus, he is oozing a small amount of yellowish/transparent fluid.  Was recently seen with pneumonia. He just finished Z-Pak and is taking prednisone Cough is better, Still has some green sputum     Review of Systems No fever chills The rash of the groin does not involve the abdomen or the scrotum but there is some redness on the penis.  Past Medical History:  Diagnosis Date  . Allergy    allergy shots Dr. Velora Heckler  . Anemia   . Anxiety   . Asthma    moderate persistant  . Bipolar affective ([place])   . Cataract    both eyes  . COPD (chronic obstructive pulmonary disease) ([place])   . Depression   . Diabetes mellitus   . DJD (degenerative joint disease)   . Dysphagia   . Gallstones    hx of, s/p cholecystectomy  . GERD (gastroesophageal reflux disease)   . Hepatitis A    as teenager 48's  . Hollenhorst plaque    right eye  . Hyperlipidemia   . Hypertension   . Kidney stones    hx of  . Meniere's disease   . Motility disorder, esophageal   . Obesity   . Pancreatitis   . Personal  history of colonic polyps 11/2004   hyperplastic.  . Stroke [facility])    per MRI    Past Surgical History:  Procedure Laterality Date  . CATARACT EXTRACTION Bilateral 09/2015 and 10/2015  . CHOLECYSTECTOMY    . COLONOSCOPY    . TOTAL KNEE ARTHROPLASTY Bilateral    both knees    Social History   Socioeconomic History  . Marital status: Married    Spouse name: Not on file  . Number of children: 1  . Years of education: 40  . Highest education level: Bachelor's degree (e.g., BA, AB, BS)  Occupational History  . Occupation: disabled    Employer: DISABLED    Comment: laboratory computing/programming  Tobacco Use  . Smoking status: Never Smoker  . Smokeless tobacco: Never Used  Substance and Sexual Activity  . Alcohol use: Not Currently  . Drug use: No  . Sexual activity: Not Currently  Other Topics Concern  . Not on file  Social History Narrative   Household-- pt , wife, adopted  daughter (h/o substance abuse,on a methadone program, doing better )   Social Determinants of Health   Financial Resource Strain:   . Difficulty of Paying Living Expenses: Not on file  Food Insecurity:   . Worried About Charity fundraiser in the Last Year: Not  on file  . Ran Out of Food in the Last Year: Not on file  Transportation Needs:   . Lack of Transportation (Medical): Not on file  . Lack of Transportation (Non-Medical): Not on file  Physical Activity:   . Days of Exercise per Week: Not on file  . Minutes of Exercise per Session: Not on file  Stress:   . Feeling of Stress : Not on file  Social Connections:   . Frequency of Communication with Friends and Family: Not on file  . Frequency of Social Gatherings with Friends and Family: Not on file  . Attends Religious Services: Not on file  . Active Member of Clubs or Organizations: Not on file  . Attends Archivist Meetings: Not on file  . Marital Status: Not on file  Intimate Partner Violence:   . Fear of Current or  Ex-Partner: Not on file  . Emotionally Abused: Not on file  . Physically Abused: Not on file  . Sexually Abused: Not on file      Allergies as of 11/12/2019      Reactions   Celexa  [citalopram Hydrobromide] Other (See Comments)   Depakote  [divalproex Sodium] Other (See Comments)   Methocarbamol    Other reaction(s): Hallucinations   Paroxetine Hcl    Other reaction(s): Insomnia   Smz-tmp Ds [sulfamethoxazole-trimethoprim] Nausea And Vomiting   Divalproex Sodium Other (See Comments)   Pancreatitis    Methocarbamol Other (See Comments)   hallucinations   Other    "Symbrax" alteration in blood sugar, pt unsure if low or high blood sugar   Paroxetine Other (See Comments)   Insomnia   Percocet [oxycodone-acetaminophen] Other (See Comments)   Hyperactivity   Risperidone Other (See Comments)   REACTION: hyper   Wellbutrin [bupropion] Other (See Comments)   Hot flashes   Citalopram Hydrobromide Other (See Comments)   Insomnia      Medication List       Accurate as of November 12, 2019 11:59 PM. If you have any questions, ask your nurse or doctor.        STOP taking these medications   amoxicillin-clavulanate 875-125 MG tablet Commonly known as: Augmentin Stopped by: Kathlene November, MD   azithromycin 250 MG tablet Commonly known as: ZITHROMAX Stopped by: Kathlene November, MD   clotrimazole-betamethasone cream Commonly known as: LOTRISONE Stopped by: Kathlene November, MD   diclofenac sodium 1 % Gel Commonly known as: VOLTAREN Stopped by: Kathlene November, MD   predniSONE 20 MG tablet Commonly known as: DELTASONE Stopped by: Kathlene November, MD     TAKE these medications   albuterol (2.5 MG/3ML) 0.083% nebulizer solution Commonly known as: PROVENTIL Take 3 mLs (2.5 mg total) by nebulization every 6 (six) hours as needed for wheezing or shortness of breath. What changed: Another medication with the same name was removed. Continue taking this medication, and follow the directions you see  here. Changed by: Kathlene November, MD   augmented betamethasone dipropionate 0.05 % ointment Commonly known as: DIPROLENE-AF Apply topically 2 (two) times daily as needed. What changed:   how much to take  reasons to take this   budesonide-formoterol 160-4.5 MCG/ACT inhaler Commonly known as: SYMBICORT Inhale 2 puffs into the lungs 2 (two) times daily.   clonazePAM 0.5 MG tablet Commonly known as: KLONOPIN Take 0.5 mg by mouth 2 (two) times daily. 1 mg in the morning and 0.5 mg in the evening.   clopidogrel 75 MG tablet Commonly known as: PLAVIX Take  1 tablet (75 mg total) by mouth daily.   Cymbalta 60 MG capsule Generic drug: DULoxetine Take 60 mg by mouth every morning.   EpiPen 2-Pak 0.3 mg/0.3 mL Soaj injection Generic drug: EPINEPHrine Reported on 04/29/2016   esomeprazole 40 MG capsule Commonly known as: NEXIUM Take 80 mg by mouth every morning.   fexofenadine 180 MG tablet Commonly known as: ALLEGRA Take 180 mg by mouth every morning.   glucose blood test strip Commonly known as: OneTouch Verio Check blood sugar no more than twice daily   ketoconazole 2 % cream Commonly known as: NIZORAL Apply 1 application topically daily. Started by: Kathlene November, MD   lamoTRIgine 25 MG tablet Commonly known as: LAMICTAL Take 50 mg by mouth at bedtime.   losartan 25 MG tablet Commonly known as: COZAAR Take 1 tablet (25 mg total) by mouth every evening.   multivitamin tablet Take 1 tablet by mouth daily.   onetouch ultrasoft lancets Check blood sugars no more than twice daily   pioglitazone-metformin 15-850 MG tablet Commonly known as: ACTOPLUS MET Take 1 tablet by mouth 2 (two) times daily with a meal.   Saphris 5 MG Subl 24 hr tablet Generic drug: asenapine Place 5 mg under the tongue at bedtime.   simvastatin 40 MG tablet Commonly known as: ZOCOR Take 1 tablet (40 mg total) by mouth at bedtime.   solifenacin 10 MG tablet Commonly known as: VESICARE Take 10  mg by mouth daily.   tamsulosin 0.4 MG Caps capsule Commonly known as: FLOMAX Take 1 capsule (0.4 mg total) by mouth daily after supper.   traZODone 50 MG tablet Commonly known as: DESYREL Take 50 mg by mouth at bedtime as needed for sleep.   Veramyst 27.5 MCG/SPRAY nasal spray Generic drug: fluticasone Place 2 sprays into the nose daily as needed for rhinitis or allergies.           Objective:   Physical Exam Pulse 95   Ht 5' 9"  (1.753 m)   Wt 260 lb (117.9 kg)   SpO2 96%   BMI 38.40 kg/m  This is a virtual video visit. The patient is alert oriented x3, he is lying down in bed in no distress    Assessment     Assessment DM HTN Hyperlipidemia Morbid obesity (BMI 39 plus DM) Bipolar, Depression ------ see psychiatry, Dr Casimiro Needle DJD-- hydrocodone rarely rx by [place] [facility] Asthma-Allergies ------------ Dr [name]  GERD, h/o dysphagia (chronic, neurogenic-transfer dysphagia? See GI note 12-2011) Neuro: --? stroke : saw neuro 2013 d/t B transient visual loss, ASA changed to plavix.  --See CTA, MRIs of head-neck report  --Saw neuro 11-2013: fall, syncope,parkinsonism d/t sapharis? CV: Enlarged ascending Ao  --per CT 12-2015,CT chest 08-2016 stable.    --MRI chest 10/2017 stable, no further routine x-rays recommended --CT chest 07/2018: Atherosclerotic changes in the thoracic aorta.  Nl LE arterial dopplers 2015 Carotid artery disease: Per ultrasound 2011, last [place] 2018: <50% B, rx medical treatment, recheck 2 years  CT chest  ---RML  10m: 07-2014, CT 12-2015, CT 08-2016, MRI chest 12,2018, CT chest 07/2018:Stable  HOH H/o Mnire's disease  Iron deficiency anemia: Noted 01/2018, + Hemoccult, 09-2018: Colonoscopy and EGD were completely normal   PLAN:  Tinea cruris: Suspect tinea cruris as described by the patient rash is involving the penis as well. rx to stop betamethasone and clotrimazole Start Nizoral twice a day. He takes a daily shower, recommend to keep the area  clean and dry as much  as possible.  Use powder if needed. He is diabetic, strongly encouraged to call me if the area gets more red, swollen. Pneumonia: See last visit, chest x-ray show pneumonia, had a Z-Pak, self started prednisone as prescribed.  No fever chills, still have some mucus/sputum.  Overall I think he is recovering well.   I discussed the assessment and treatment plan with the patient. The patient was provided an opportunity to ask questions and all were answered. The patient agreed with the plan and demonstrated an understanding of the instructions.   The patient was advised to call back or seek an in-person evaluation if the symptoms worsen or if the condition fails to improve as anticipated.

## 2019-11-13 NOTE — Assessment & Plan Note (Signed)
Tinea cruris: Suspect tinea cruris as described by the patient rash is involving the penis as well. rx to stop betamethasone and clotrimazole Start Nizoral twice a day. He takes a daily shower, recommend to keep the area clean and dry as much as possible.  Use powder if needed. He is diabetic, strongly encouraged to call me if the area gets more red, swollen. Pneumonia: See last visit, chest x-ray show pneumonia, had a Z-Pak, self started prednisone as prescribed.  No fever chills, still have some mucus/sputum.  Overall I think he is recovering well.

## 2019-11-14 ENCOUNTER — Other Ambulatory Visit: Payer: Self-pay

## 2019-11-14 ENCOUNTER — Ambulatory Visit (INDEPENDENT_AMBULATORY_CARE_PROVIDER_SITE_OTHER): Payer: Medicare Other | Admitting: Internal Medicine

## 2019-11-14 ENCOUNTER — Encounter: Payer: Self-pay | Admitting: Internal Medicine

## 2019-11-14 ENCOUNTER — Telehealth: Payer: Self-pay | Admitting: Internal Medicine

## 2019-11-14 VITALS — BP 129/85 | HR 97 | Temp 97.5°F

## 2019-11-14 DIAGNOSIS — B356 Tinea cruris: Secondary | ICD-10-CM | POA: Diagnosis not present

## 2019-11-14 MED ORDER — TERBINAFINE HCL 250 MG PO TABS: 250.0000 mg | ORAL_TABLET | Freq: Every day | ORAL | 0 refills | Status: DC

## 2019-11-14 MED ORDER — CEPHALEXIN 500 MG PO CAPS: 500.0000 mg | ORAL_CAPSULE | Freq: Four times a day (QID) | ORAL | 0 refills | Status: DC

## 2019-11-14 NOTE — Progress Notes (Signed)
Subjective:    Patient ID: Ryan Zhang, male    DOB: 01/19/1946, 73 y.o.   MRN: [id number]  DOS:  11/14/2019 Type of visit - description: Virtual Visit via Video Note  I connected with the above patient  by a video enabled telemedicine application and verified that I am speaking with the correct person using two identifiers.   THIS ENCOUNTER IS A VIRTUAL VISIT DUE TO COVID-19 - PATIENT WAS NOT SEEN IN THE OFFICE. PATIENT HAS CONSENTED TO VIRTUAL VISIT / TELEMEDICINE VISIT   Location of patient: home  Location of provider: office  I discussed the limitations of evaluation and management by telemedicine and the availability of in person appointments. The patient expressed understanding and agreed to proceed.  History of Present Illness:   Acute The patient was seen recently with tinea cruris, a cream was prescribed, he reported that he is not getting better. The area is spreading on his very painful. When asked, admits that he does not have much control of his urine on sometimes it gets to the groins.  He send me pictures.  Review of Systems No fever chills No dysuria or gross hematuria  Past Medical History:  Diagnosis Date  . Allergy    allergy shots Dr. Velora Heckler  . Anemia   . Anxiety   . Asthma    moderate persistant  . Bipolar affective ([place])   . Cataract    both eyes  . COPD (chronic obstructive pulmonary disease) ([place])   . Depression   . Diabetes mellitus   . DJD (degenerative joint disease)   . Dysphagia   . Gallstones    hx of, s/p cholecystectomy  . GERD (gastroesophageal reflux disease)   . Hepatitis A    as teenager 81's  . Hollenhorst plaque    right eye  . Hyperlipidemia   . Hypertension   . Kidney stones    hx of  . Meniere's disease   . Motility disorder, esophageal   . Obesity   . Pancreatitis   . Personal history of colonic polyps 11/2004   hyperplastic.  . Stroke [facility])    per MRI    Past Surgical History:  Procedure Laterality Date   . CATARACT EXTRACTION Bilateral 09/2015 and 10/2015  . CHOLECYSTECTOMY    . COLONOSCOPY    . TOTAL KNEE ARTHROPLASTY Bilateral    both knees    Social History   Socioeconomic History  . Marital status: Married    Spouse name: Not on file  . Number of children: 1  . Years of education: 33  . Highest education level: Bachelor's degree (e.g., BA, AB, BS)  Occupational History  . Occupation: disabled    Employer: DISABLED    Comment: laboratory computing/programming  Tobacco Use  . Smoking status: Never Smoker  . Smokeless tobacco: Never Used  Substance and Sexual Activity  . Alcohol use: Not Currently  . Drug use: No  . Sexual activity: Not Currently  Other Topics Concern  . Not on file  Social History Narrative   Household-- pt , wife, adopted  daughter (h/o substance abuse,on a methadone program, doing better )   Social Determinants of Health   Financial Resource Strain:   . Difficulty of Paying Living Expenses: Not on file  Food Insecurity:   . Worried About Charity fundraiser in the Last Year: Not on file  . Ran Out of Food in the Last Year: Not on file  Transportation Needs:   .  Lack of Transportation (Medical): Not on file  . Lack of Transportation (Non-Medical): Not on file  Physical Activity:   . Days of Exercise per Week: Not on file  . Minutes of Exercise per Session: Not on file  Stress:   . Feeling of Stress : Not on file  Social Connections:   . Frequency of Communication with Friends and Family: Not on file  . Frequency of Social Gatherings with Friends and Family: Not on file  . Attends Religious Services: Not on file  . Active Member of Clubs or Organizations: Not on file  . Attends Archivist Meetings: Not on file  . Marital Status: Not on file  Intimate Partner Violence:   . Fear of Current or Ex-Partner: Not on file  . Emotionally Abused: Not on file  . Physically Abused: Not on file  . Sexually Abused: Not on file       Allergies as of 11/14/2019      Reactions   Celexa  [citalopram Hydrobromide] Other (See Comments)   Depakote  [divalproex Sodium] Other (See Comments)   Methocarbamol    Other reaction(s): Hallucinations   Paroxetine Hcl    Other reaction(s): Insomnia   Smz-tmp Ds [sulfamethoxazole-trimethoprim] Nausea And Vomiting   Divalproex Sodium Other (See Comments)   Pancreatitis    Methocarbamol Other (See Comments)   hallucinations   Other    "Symbrax" alteration in blood sugar, pt unsure if low or high blood sugar   Paroxetine Other (See Comments)   Insomnia   Percocet [oxycodone-acetaminophen] Other (See Comments)   Hyperactivity   Risperidone Other (See Comments)   REACTION: hyper   Wellbutrin [bupropion] Other (See Comments)   Hot flashes   Citalopram Hydrobromide Other (See Comments)   Insomnia      Medication List       Accurate as of November 14, 2019  2:59 PM. If you have any questions, ask your nurse or doctor.        albuterol (2.5 MG/3ML) 0.083% nebulizer solution Commonly known as: PROVENTIL Take 3 mLs (2.5 mg total) by nebulization every 6 (six) hours as needed for wheezing or shortness of breath.   augmented betamethasone dipropionate 0.05 % ointment Commonly known as: DIPROLENE-AF Apply topically 2 (two) times daily as needed. What changed:   how much to take  reasons to take this   budesonide-formoterol 160-4.5 MCG/ACT inhaler Commonly known as: SYMBICORT Inhale 2 puffs into the lungs 2 (two) times daily.   clonazePAM 0.5 MG tablet Commonly known as: KLONOPIN Take 0.5 mg by mouth 2 (two) times daily. 1 mg in the morning and 0.5 mg in the evening.   clopidogrel 75 MG tablet Commonly known as: PLAVIX Take 1 tablet (75 mg total) by mouth daily.   Cymbalta 60 MG capsule Generic drug: DULoxetine Take 60 mg by mouth every morning.   EpiPen 2-Pak 0.3 mg/0.3 mL Soaj injection Generic drug: EPINEPHrine Reported on 04/29/2016   esomeprazole 40 MG  capsule Commonly known as: NEXIUM Take 80 mg by mouth every morning.   fexofenadine 180 MG tablet Commonly known as: ALLEGRA Take 180 mg by mouth every morning.   glucose blood test strip Commonly known as: OneTouch Verio Check blood sugar no more than twice daily   ketoconazole 2 % cream Commonly known as: NIZORAL Apply 1 application topically daily.   lamoTRIgine 25 MG tablet Commonly known as: LAMICTAL Take 50 mg by mouth at bedtime.   losartan 25 MG tablet Commonly known as:  COZAAR Take 1 tablet (25 mg total) by mouth every evening.   multivitamin tablet Take 1 tablet by mouth daily.   onetouch ultrasoft lancets Check blood sugars no more than twice daily   pioglitazone-metformin 15-850 MG tablet Commonly known as: ACTOPLUS MET Take 1 tablet by mouth 2 (two) times daily with a meal.   Saphris 5 MG Subl 24 hr tablet Generic drug: asenapine Place 5 mg under the tongue at bedtime.   simvastatin 40 MG tablet Commonly known as: ZOCOR Take 1 tablet (40 mg total) by mouth at bedtime.   solifenacin 10 MG tablet Commonly known as: VESICARE Take 10 mg by mouth daily.   tamsulosin 0.4 MG Caps capsule Commonly known as: FLOMAX Take 1 capsule (0.4 mg total) by mouth daily after supper.   traZODone 50 MG tablet Commonly known as: DESYREL Take 50 mg by mouth at bedtime as needed for sleep.   Veramyst 27.5 MCG/SPRAY nasal spray Generic drug: fluticasone Place 2 sprays into the nose daily as needed for rhinitis or allergies.           Objective:   Physical Exam BP 129/85   Pulse 97   Temp (!) 97.5 F (36.4 C)  This is a virtual video visit.  He is alert oriented x3, in no apparent distress, seems to be resting well.       Assessment      Assessment DM HTN Hyperlipidemia Morbid obesity (BMI 39 plus DM) Bipolar, Depression ------ see psychiatry, Dr Casimiro Needle DJD-- hydrocodone rarely rx by [place] ortho Asthma-Allergies ------------ Dr [name]  GERD,  h/o dysphagia (chronic, neurogenic-transfer dysphagia? See GI note 12-2011) Neuro: --? stroke : saw neuro 2013 d/t B transient visual loss, ASA changed to plavix.  --See CTA, MRIs of head-neck report  --Saw neuro 11-2013: fall, syncope,parkinsonism d/t sapharis? CV: Enlarged ascending Ao  --per CT 12-2015,CT chest 08-2016 stable.    --MRI chest 10/2017 stable, no further routine x-rays recommended --CT chest 07/2018: Atherosclerotic changes in the thoracic aorta.  Nl LE arterial dopplers 2015 Carotid artery disease: Per ultrasound 2011, last [place] 2018: <50% B, rx medical treatment, recheck 2 years  CT chest  ---RML  51m: 07-2014, CT 12-2015, CT 08-2016, MRI chest 12,2018, CT chest 07/2018:Stable  HOH H/o Mnire's disease  Iron deficiency anemia: Noted 01/2018, + Hemoccult, 09-2018: Colonoscopy and EGD were completely normal   PLAN: Tinea cruris: Severe irritation without apparent swelling, likely due to tinea cruris, my concern is the is at high risk of develop cellulitis. Plan: Continue Nizoral twice a day Add Keflex for 1 week Add Lamisil for 1 week (no interactions with current medications noted) Use diapers to prevent urine in the area of irritation Most important, if he is not better needs to be seek further evaluation.  He verbalized understanding.    I discussed the assessment and treatment plan with the patient. The patient was provided an opportunity to ask questions and all were answered. The patient agreed with the plan and demonstrated an understanding of the instructions.   The patient was advised to call back or seek an in-person evaluation if the symptoms worsen or if the condition fails to improve as anticipated.

## 2019-11-14 NOTE — Telephone Encounter (Signed)
Addendum Unfortunately, due to Covid cases in the community were going virtual.  We will see him virtually this afternoon.  I called  him and ask him to send me a good quality picture of the rash if possible

## 2019-11-14 NOTE — Telephone Encounter (Signed)
I really need to see him if he is not better. Please make an appointment for today, I have some openings this afternoon.

## 2019-11-14 NOTE — Telephone Encounter (Signed)
Pt was prescribed ketoconazole (NIZORAL) 2 % cream To apply once a day but he states it doesn't help for the whole day, so he applies it twice a day. Pt stated that the rash is starting to look like a 2nd degree burn and is spread up both thighs. Pt wants to know if it is ok to apply twice a day and if he should shower between each application/ please advise

## 2019-11-17 NOTE — Assessment & Plan Note (Signed)
Tinea cruris: Severe irritation without apparent swelling, likely due to tinea cruris, my concern is the is at high risk of develop cellulitis. Plan: Continue Nizoral twice a day Add Keflex for 1 week Add Lamisil for 1 week (no interactions with current medications noted) Use diapers to prevent urine in the area of irritation Most important, if he is not better needs to be seek further evaluation.  He verbalized understanding.

## 2019-11-18 ENCOUNTER — Other Ambulatory Visit: Payer: Self-pay | Admitting: Internal Medicine

## 2019-11-19 ENCOUNTER — Other Ambulatory Visit: Payer: Self-pay | Admitting: Internal Medicine

## 2019-11-20 ENCOUNTER — Encounter: Payer: Self-pay | Admitting: Internal Medicine

## 2019-11-21 ENCOUNTER — Other Ambulatory Visit: Payer: Self-pay | Admitting: Internal Medicine

## 2019-11-21 DIAGNOSIS — J3089 Other allergic rhinitis: Secondary | ICD-10-CM | POA: Diagnosis not present

## 2019-11-21 DIAGNOSIS — J301 Allergic rhinitis due to pollen: Secondary | ICD-10-CM | POA: Diagnosis not present

## 2019-11-28 DIAGNOSIS — J3089 Other allergic rhinitis: Secondary | ICD-10-CM | POA: Diagnosis not present

## 2019-11-28 DIAGNOSIS — J301 Allergic rhinitis due to pollen: Secondary | ICD-10-CM | POA: Diagnosis not present

## 2019-12-01 ENCOUNTER — Other Ambulatory Visit: Payer: Self-pay | Admitting: Internal Medicine

## 2019-12-05 DIAGNOSIS — J3089 Other allergic rhinitis: Secondary | ICD-10-CM | POA: Diagnosis not present

## 2019-12-05 DIAGNOSIS — J301 Allergic rhinitis due to pollen: Secondary | ICD-10-CM | POA: Diagnosis not present

## 2019-12-12 DIAGNOSIS — J301 Allergic rhinitis due to pollen: Secondary | ICD-10-CM | POA: Diagnosis not present

## 2019-12-12 DIAGNOSIS — J3089 Other allergic rhinitis: Secondary | ICD-10-CM | POA: Diagnosis not present

## 2019-12-19 DIAGNOSIS — J3089 Other allergic rhinitis: Secondary | ICD-10-CM | POA: Diagnosis not present

## 2019-12-19 DIAGNOSIS — J301 Allergic rhinitis due to pollen: Secondary | ICD-10-CM | POA: Diagnosis not present

## 2019-12-20 ENCOUNTER — Encounter: Payer: Medicare Other | Admitting: Internal Medicine

## 2019-12-23 ENCOUNTER — Ambulatory Visit: Payer: Medicare Other | Attending: Internal Medicine

## 2019-12-23 DIAGNOSIS — Z23 Encounter for immunization: Secondary | ICD-10-CM | POA: Insufficient documentation

## 2019-12-23 NOTE — Progress Notes (Signed)
   Covid-19 Vaccination Clinic  Name:  Ryan Zhang    MRN: [id number] DOB: 03-09-1946  12/23/2019  Ryan Zhang was observed post Covid-19 immunization for 15 minutes without incidence. He was provided with Vaccine Information Sheet and instruction to access the V-Safe system.   Ryan Zhang was instructed to call 911 with any severe reactions post vaccine: [name] Difficulty breathing  . Swelling of your face and throat  . A fast heartbeat  . A bad rash all over your body  . Dizziness and weakness    Immunizations Administered    Name Date Dose VIS Date Route   Pfizer COVID-19 Vaccine 12/23/2019 11:19 AM 0.3 mL 10/26/2019 Intramuscular   Manufacturer: [employer], [employer]   Lot: [id number]   NDC: [address]-1

## 2019-12-26 DIAGNOSIS — J301 Allergic rhinitis due to pollen: Secondary | ICD-10-CM | POA: Diagnosis not present

## 2019-12-26 DIAGNOSIS — J3089 Other allergic rhinitis: Secondary | ICD-10-CM | POA: Diagnosis not present

## 2019-12-27 DIAGNOSIS — J3089 Other allergic rhinitis: Secondary | ICD-10-CM | POA: Diagnosis not present

## 2019-12-27 DIAGNOSIS — J301 Allergic rhinitis due to pollen: Secondary | ICD-10-CM | POA: Diagnosis not present

## 2020-01-02 DIAGNOSIS — J3089 Other allergic rhinitis: Secondary | ICD-10-CM | POA: Diagnosis not present

## 2020-01-02 DIAGNOSIS — J301 Allergic rhinitis due to pollen: Secondary | ICD-10-CM | POA: Diagnosis not present

## 2020-01-09 ENCOUNTER — Other Ambulatory Visit: Payer: Self-pay

## 2020-01-09 ENCOUNTER — Ambulatory Visit: Payer: Medicare Other

## 2020-01-09 DIAGNOSIS — J301 Allergic rhinitis due to pollen: Secondary | ICD-10-CM | POA: Diagnosis not present

## 2020-01-09 DIAGNOSIS — J3089 Other allergic rhinitis: Secondary | ICD-10-CM | POA: Diagnosis not present

## 2020-01-10 ENCOUNTER — Encounter: Payer: Self-pay | Admitting: Internal Medicine

## 2020-01-10 ENCOUNTER — Other Ambulatory Visit: Payer: Self-pay

## 2020-01-10 ENCOUNTER — Ambulatory Visit (INDEPENDENT_AMBULATORY_CARE_PROVIDER_SITE_OTHER): Payer: Medicare Other | Admitting: Internal Medicine

## 2020-01-10 VITALS — BP 137/68 | HR 77 | Temp 96.4°F | Resp 18 | Ht 69.0 in | Wt 255.2 lb

## 2020-01-10 DIAGNOSIS — E1142 Type 2 diabetes mellitus with diabetic polyneuropathy: Secondary | ICD-10-CM | POA: Diagnosis not present

## 2020-01-10 DIAGNOSIS — E1169 Type 2 diabetes mellitus with other specified complication: Secondary | ICD-10-CM

## 2020-01-10 DIAGNOSIS — Z0000 Encounter for general adult medical examination without abnormal findings: Secondary | ICD-10-CM

## 2020-01-10 DIAGNOSIS — E785 Hyperlipidemia, unspecified: Secondary | ICD-10-CM | POA: Diagnosis not present

## 2020-01-10 LAB — CBC WITH DIFFERENTIAL/PLATELET
Basophils Absolute: 0 10*3/uL (ref 0.0–0.1)
Basophils Relative: 0.6 % (ref 0.0–3.0)
Eosinophils Absolute: 0.2 10*3/uL (ref 0.0–0.7)
Eosinophils Relative: 2.2 % (ref 0.0–5.0)
HCT: 37.6 % — ABNORMAL LOW (ref 39.0–52.0)
Hemoglobin: 12.2 g/dL — ABNORMAL LOW (ref 13.0–17.0)
Lymphocytes Relative: 23.4 % (ref 12.0–46.0)
Lymphs Abs: 1.7 10*3/uL (ref 0.7–4.0)
MCHC: 32.4 g/dL (ref 30.0–36.0)
MCV: 83.2 fl (ref 78.0–100.0)
Monocytes Absolute: 0.6 10*3/uL (ref 0.1–1.0)
Monocytes Relative: 7.5 % (ref 3.0–12.0)
Neutro Abs: 4.9 10*3/uL (ref 1.4–7.7)
Neutrophils Relative %: 66.3 % (ref 43.0–77.0)
Platelets: 246 10*3/uL (ref 150.0–400.0)
RBC: 4.52 Mil/uL (ref 4.22–5.81)
RDW: 17.1 % — ABNORMAL HIGH (ref 11.5–15.5)
WBC: 7.5 10*3/uL (ref 4.0–10.5)

## 2020-01-10 LAB — COMPREHENSIVE METABOLIC PANEL
ALT: 12 U/L (ref 0–53)
AST: 16 U/L (ref 0–37)
Albumin: 3.8 g/dL (ref 3.5–5.2)
Alkaline Phosphatase: 75 U/L (ref 39–117)
BUN: 21 mg/dL (ref 6–23)
CO2: 27 mEq/L (ref 19–32)
Calcium: 9 mg/dL (ref 8.4–10.5)
Chloride: 105 mEq/L (ref 96–112)
Creatinine, Ser: 0.98 mg/dL (ref 0.40–1.50)
GFR: 75.27 mL/min (ref >60.00)
Glucose, Bld: 139 mg/dL — ABNORMAL HIGH (ref 70–99)
Potassium: 4.2 mEq/L (ref 3.5–5.1)
Sodium: 140 mEq/L (ref 135–145)
Total Bilirubin: 1 mg/dL (ref 0.2–1.2)
Total Protein: 6.6 g/dL (ref 6.0–8.3)

## 2020-01-10 LAB — LIPID PANEL
Cholesterol: 96 mg/dL (ref 0–200)
HDL: 36.8 mg/dL — ABNORMAL LOW (ref >39.00)
LDL Cholesterol: 38 mg/dL (ref 0–99)
NonHDL: 59.67
Total CHOL/HDL Ratio: 3
Triglycerides: 107 mg/dL (ref 0.0–149.0)
VLDL: 21.4 mg/dL (ref 0.0–40.0)

## 2020-01-10 NOTE — Progress Notes (Signed)
Pre visit review using our clinic review tool, if applicable. No additional management support is needed unless otherwise documented below in the visit note. 

## 2020-01-10 NOTE — Patient Instructions (Addendum)
GO TO THE LAB : Get the blood work     GO TO THE FRONT DESK Come back for a check up in 4 months  , please make an appointment      Advance care planning (ACP)  Thinking about ACP is very important even if you are healthy.  It ensures support of your values, goals, believes and preferences regarding future medical care. Once you think about it, put it in writing in a Advanced Directive (AD) document There are many types of ADs The AD document is in use only once the patient has lost the ability to make decisions  The AD can be revoked orally or in writing it at any time while the patient remains capable to makes decisions This is just a summary, there are many resources on time for instance, http://www.russell.info/  Types of AD  1. "Physician Orders for Life-Sustaining Treatment" or POLST:  -Names changed by state -Example MOST in [place] [email or web address]  2.  "DURABLE POWER OF ATTORNEY for healthcare"  Other names: " healthcare proxy designation" , "healthcare power of attorney" Essentially is signed legal document authorizing another person to make medical decisions on the patient's behalf in the event the patient loses decisional capacity.  3. "Living will" LW: Is a document summarizing a person's preference for future medical care.  Typically takes effect if the person is terminally ill without chance of recovery and outlined the desires to withhold heroic measures.  4.  Combined directives Has components of a LW, values history, instructional directives and designates a surrogate for decision making

## 2020-01-10 NOTE — Progress Notes (Signed)
Subjective:    Patient ID: Ryan Zhang, male    DOB: Apr 02, 1946, 74 y.o.   MRN: [id number]  DOS:  01/10/2020 Type of visit - description: cpx Here for CPX. We addressed other issues as well. For the last couple of months has noted that he gets lightheaded when he stands up after sitting for a while. No associated headache, chest pain, palpitations or strokelike symptoms. Symptoms are sporadic, no LOC. Have similar symptoms when he turns his head very quickly.  Review of Systems Had a UTI and gross hematuria, currently with no fever, chills or LUTS  Other than above, a 14 point review of systems is negative    Past Medical History:  Diagnosis Date  . Allergy    allergy shots Dr. Velora Heckler  . Anemia   . Anxiety   . Asthma    moderate persistant  . Bipolar affective ([place])   . Cataract    both eyes  . COPD (chronic obstructive pulmonary disease) ([place])   . Depression   . Diabetes mellitus   . DJD (degenerative joint disease)   . Dysphagia   . Gallstones    hx of, s/p cholecystectomy  . GERD (gastroesophageal reflux disease)   . Hepatitis A    as teenager 83's  . Hollenhorst plaque    right eye  . Hyperlipidemia   . Hypertension   . Kidney stones    hx of  . Meniere's disease   . Motility disorder, esophageal   . Obesity   . Pancreatitis   . Personal history of colonic polyps 11/2004   hyperplastic.  . Stroke [facility])    per MRI    Past Surgical History:  Procedure Laterality Date  . CATARACT EXTRACTION Bilateral 09/2015 and 10/2015  . CHOLECYSTECTOMY    . COLONOSCOPY    . TOTAL KNEE ARTHROPLASTY Bilateral    both knees   Family History  Problem Relation Age of Onset  . Diabetes Father   . Heart disease Brother   . Heart disease Maternal Grandmother   . Cancer Sister        unknown type  . Colon cancer Neg Hx   . Prostate cancer Neg Hx   . Colon polyps Neg Hx   . Esophageal cancer Neg Hx   . Rectal cancer Neg Hx   . Stomach cancer Neg Hx      Allergies as of 01/10/2020      Reactions   Celexa  [citalopram Hydrobromide] Other (See Comments)   Depakote  [divalproex Sodium] Other (See Comments)   Methocarbamol    Other reaction(s): Hallucinations   Paroxetine Hcl    Other reaction(s): Insomnia   Smz-tmp Ds [sulfamethoxazole-trimethoprim] Nausea And Vomiting   Divalproex Sodium Other (See Comments)   Pancreatitis    Methocarbamol Other (See Comments)   hallucinations   Other    "Symbrax" alteration in blood sugar, pt unsure if low or high blood sugar   Paroxetine Other (See Comments)   Insomnia   Percocet [oxycodone-acetaminophen] Other (See Comments)   Hyperactivity   Risperidone Other (See Comments)   REACTION: hyper   Wellbutrin [bupropion] Other (See Comments)   Hot flashes   Citalopram Hydrobromide Other (See Comments)   Insomnia      Medication List       Accurate as of January 10, 2020 11:59 PM. If you have any questions, ask your nurse or doctor.        STOP taking these medications   cephALEXin  500 MG capsule Commonly known as: KEFLEX Stopped by: Kathlene November, MD   terbinafine 250 MG tablet Commonly known as: LAMISIL Stopped by: Kathlene November, MD     TAKE these medications   albuterol (2.5 MG/3ML) 0.083% nebulizer solution Commonly known as: PROVENTIL Take 3 mLs (2.5 mg total) by nebulization every 6 (six) hours as needed for wheezing or shortness of breath.   augmented betamethasone dipropionate 0.05 % ointment Commonly known as: DIPROLENE-AF Apply topically 2 (two) times daily as needed. What changed:   how much to take  reasons to take this   budesonide-formoterol 160-4.5 MCG/ACT inhaler Commonly known as: SYMBICORT Inhale 2 puffs into the lungs 2 (two) times daily.   clonazePAM 0.5 MG tablet Commonly known as: KLONOPIN Take 0.5 mg by mouth 2 (two) times daily. 1 mg in the morning and 0.5 mg in the evening.   clopidogrel 75 MG tablet Commonly known as: PLAVIX Take 1 tablet (75 mg  total) by mouth daily.   Cymbalta 60 MG capsule Generic drug: DULoxetine Take 60 mg by mouth every morning.   EpiPen 2-Pak 0.3 mg/0.3 mL Soaj injection Generic drug: EPINEPHrine Reported on 04/29/2016   esomeprazole 40 MG capsule Commonly known as: NEXIUM Take 80 mg by mouth every morning.   fexofenadine 180 MG tablet Commonly known as: ALLEGRA Take 180 mg by mouth every morning.   ketoconazole 2 % cream Commonly known as: NIZORAL apply to the affected topical area once daily   lamoTRIgine 25 MG tablet Commonly known as: LAMICTAL Take 50 mg by mouth at bedtime.   losartan 25 MG tablet Commonly known as: COZAAR Take 1 tablet (25 mg total) by mouth every evening.   multivitamin tablet Take 1 tablet by mouth daily.   onetouch ultrasoft lancets Check blood sugars no more than twice daily   OneTouch Verio test strip Generic drug: glucose blood use to check blood sugar no more than twice daily   pioglitazone-metformin 15-850 MG tablet Commonly known as: ACTOPLUS MET TAKE ONE TABLET BY MOUTH TWICE DAILY WITH MEALS   Saphris 5 MG Subl 24 hr tablet Generic drug: asenapine Place 5 mg under the tongue at bedtime.   simvastatin 40 MG tablet Commonly known as: ZOCOR Take 1 tablet (40 mg total) by mouth at bedtime.   solifenacin 10 MG tablet Commonly known as: VESICARE Take 10 mg by mouth daily.   tamsulosin 0.4 MG Caps capsule Commonly known as: FLOMAX Take 1 capsule (0.4 mg total) by mouth daily after supper.   traZODone 50 MG tablet Commonly known as: DESYREL Take 50 mg by mouth at bedtime as needed for sleep.   Veramyst 27.5 MCG/SPRAY nasal spray Generic drug: fluticasone Place 2 sprays into the nose daily as needed for rhinitis or allergies.             Objective:   Physical Exam BP 137/68 (BP Location: Left Arm, Patient Position: Sitting, Cuff Size: Normal)   Pulse 77   Temp (!) 96.4 F (35.8 C) (Temporal)   Resp 18   Ht 5' 9"  (1.753 m)   Wt  255 lb 4 oz (115.8 kg)   SpO2 100%   BMI 37.69 kg/m  General: Well developed, NAD, BMI noted Neck: No  thyromegaly.  Normal carotid pulses HEENT:  Normocephalic . Face symmetric, atraumatic Lungs:  CTA B Normal respiratory effort, no intercostal retractions, no accessory muscle use. Heart: RRR,  no murmur.  Abdomen:  Not distended, soft, non-tender. No rebound or rigidity.  DM foot exam: Good pulses, no edema, pinprick normal. Skin: Exposed areas without rash. Not pale. Not jaundice Neurologic:  alert & oriented X3.  Speech normal, gait appropriate for age and unassisted Strength symmetric and appropriate for age.  Psych: Cognition and judgment appear intact.  Cooperative with normal attention span and concentration.  Behavior appropriate. No anxious or depressed appearing.     Assessment       Assessment DM HTN Hyperlipidemia Morbid obesity (BMI 39 plus DM) Bipolar, Depression ------ see psychiatry, Dr Casimiro Needle DJD-- hydrocodone rarely rx by [place] ortho Asthma-Allergies ------------ Dr [name]  GERD, h/o dysphagia (chronic, neurogenic-transfer dysphagia? See GI note 12-2011) Neuro: --? stroke : saw neuro 2013 d/t B transient visual loss, ASA changed to plavix.  --See CTA, MRIs of head-neck report  --Saw neuro 11-2013: fall, syncope,parkinsonism d/t sapharis? CV: Enlarged ascending Ao  --per CT 12-2015,CT chest 08-2016 stable.    --MRI chest 10/2017 stable, no further routine x-rays recommended --CT chest 07/2018: Atherosclerotic changes in the thoracic aorta.  Nl LE arterial dopplers 2015 Carotid artery disease: Per ultrasound 2011, last [place] 2018: <50% B, rx medical treatment, recheck 08/2019 < 1-39% B, stable 2 years  CT chest  ---RML  42m: 07-2014, CT 12-2015, CT 08-2016, MRI chest 12,2018, CT chest 07/2018:Stable  HOH H/o Mnire's disease  Iron deficiency anemia: Noted 01/2018, + Hemoccult, 09-2018: Colonoscopy and EGD were completely normal   PLAN: Here for  CPX DM: Continue Actos plus met, check A1c.   HTN: Ambulatory BPs typically in the 130s.  Continue losartan. High cholesterol: On simvastatin, checking labs Bipolar, depression: On multiple meds, PHQ-9 today was 9. Dizziness: As described above, likely postural decrease of BP, rec good hydration, watch for falls, get up slowly, call if symptoms increase or worsen. Last carotid [place] stable on 08-2019. UTI, gross hematuria: Saw urology, was prescribed Vesicare, no further problems. RTC 4 months       This visit occurred during the SARS-CoV-2 public health emergency.  Safety protocols were in place, including screening questions prior to the visit, additional usage of staff PPE, and extensive cleaning of exam room while observing appropriate contact time as indicated for disinfecting solutions.

## 2020-01-11 LAB — HEMOGLOBIN A1C: Hgb A1c MFr Bld: 6.6 % — ABNORMAL HIGH (ref 4.6–6.5)

## 2020-01-12 ENCOUNTER — Other Ambulatory Visit: Payer: Self-pay | Admitting: Internal Medicine

## 2020-01-12 NOTE — Assessment & Plan Note (Signed)
Here for CPX DM: Continue Actos plus met, check A1c.   HTN: Ambulatory BPs typically in the 130s.  Continue losartan. High cholesterol: On simvastatin, checking labs Bipolar, depression: On multiple meds, PHQ-9 today was 9. Dizziness: As described above, likely postural decrease of BP, rec good hydration, watch for falls, get up slowly, call if symptoms increase or worsen. Last carotid US: stable on 08-2019. UTI, gross hematuria: Saw urology, was prescribed Vesicare, no further problems. RTC 4 months

## 2020-01-12 NOTE — Assessment & Plan Note (Signed)
-  Td 2018 - pnm shot 2015; prevnar 2016 - zostavax 2013; s/p shingrex x 2 per pt -  Had a  flu shot  -CCS: Normal  Cscope 2011; cscope 09/2018, next per GI -Prostate ca screening: DRE PSA wnl 2019; PSA  Normal again  06/2019 (had a UTI) -Diet-exercise- Fall prevention : d/w pt  -Advance care planning d/w pt  -lab:  CMP, FLP, CBC, A1c

## 2020-01-16 ENCOUNTER — Ambulatory Visit: Payer: Medicare Other

## 2020-01-16 ENCOUNTER — Ambulatory Visit: Payer: Medicare Other | Attending: Internal Medicine

## 2020-01-16 DIAGNOSIS — Z23 Encounter for immunization: Secondary | ICD-10-CM

## 2020-01-16 NOTE — Progress Notes (Signed)
   Covid-19 Vaccination Clinic  Name:  MARQUIE ADERHOLD    MRN: [id number] DOB: [birth year]  01/16/2020  Mr. Mcclenny was observed post Covid-19 immunization for 30 minutes based on pre-vaccination screening without incident. He was provided with Vaccine Information Sheet and instruction to access the V-Safe system.   Mr. Rebert was instructed to call 911 with any severe reactions post vaccine: [name] Difficulty breathing  . Swelling of face and throat  . A fast heartbeat  . A bad rash all over body  . Dizziness and weakness   Immunizations Administered    Name Date Dose VIS Date Route   Pfizer COVID-19 Vaccine 01/16/2020 10:19 AM 0.3 mL 10/26/2019 Intramuscular   Manufacturer: [employer], [employer]   Lot: [id number]   NDC: [address]-7

## 2020-01-19 ENCOUNTER — Other Ambulatory Visit: Payer: Self-pay | Admitting: Internal Medicine

## 2020-01-23 DIAGNOSIS — J3089 Other allergic rhinitis: Secondary | ICD-10-CM | POA: Diagnosis not present

## 2020-01-23 DIAGNOSIS — J301 Allergic rhinitis due to pollen: Secondary | ICD-10-CM | POA: Diagnosis not present

## 2020-01-28 DIAGNOSIS — J3089 Other allergic rhinitis: Secondary | ICD-10-CM | POA: Diagnosis not present

## 2020-01-28 DIAGNOSIS — J301 Allergic rhinitis due to pollen: Secondary | ICD-10-CM | POA: Diagnosis not present

## 2020-01-30 DIAGNOSIS — J3089 Other allergic rhinitis: Secondary | ICD-10-CM | POA: Diagnosis not present

## 2020-01-30 DIAGNOSIS — J301 Allergic rhinitis due to pollen: Secondary | ICD-10-CM | POA: Diagnosis not present

## 2020-02-04 ENCOUNTER — Telehealth: Payer: Self-pay | Admitting: Internal Medicine

## 2020-02-04 DIAGNOSIS — J3089 Other allergic rhinitis: Secondary | ICD-10-CM | POA: Diagnosis not present

## 2020-02-04 DIAGNOSIS — J301 Allergic rhinitis due to pollen: Secondary | ICD-10-CM | POA: Diagnosis not present

## 2020-02-04 NOTE — Progress Notes (Signed)
  Chronic Care Management   Note  02/04/2020 Name: LAMONE FERRELLI MRN: [id number] DOB: 04-08-1946  [name] is a 73 y.o. year old male who is a primary care patient of Wanda Plump, MD. I reached out to [name] by phone today in response to a referral sent by Mr. Lenvil Swaim Morriss's PCP, Wanda Plump, MD.   Mr. Gracie was given information about Chronic Care Management services today including:  1. CCM service includes personalized support from designated clinical staff supervised by his physician, including individualized plan of care and coordination with other care providers 2. 24/7 contact phone numbers for assistance for urgent and routine care needs. 3. Service will only be billed when office clinical staff spend 20 minutes or more in a month to coordinate care. 4. Only one practitioner may furnish and bill the service in a calendar month. 5. The patient may stop CCM services at any time (effective at the end of the month) by phone call to the office staff.   Patient agreed to services and verbal consent obtained.   Follow up plan:   Raynicia Dukes [employer] Scheduler

## 2020-02-05 ENCOUNTER — Observation Stay (HOSPITAL_BASED_OUTPATIENT_CLINIC_OR_DEPARTMENT_OTHER)
Admission: EM | Admit: 2020-02-05 | Discharge: 2020-02-06 | Disposition: A | Discharge status: Home / Self Care | Payer: Medicare Other | Attending: Internal Medicine | Admitting: Internal Medicine

## 2020-02-05 ENCOUNTER — Other Ambulatory Visit: Payer: Self-pay

## 2020-02-05 ENCOUNTER — Encounter (HOSPITAL_BASED_OUTPATIENT_CLINIC_OR_DEPARTMENT_OTHER): Payer: Self-pay | Admitting: *Deleted

## 2020-02-05 ENCOUNTER — Emergency Department (HOSPITAL_BASED_OUTPATIENT_CLINIC_OR_DEPARTMENT_OTHER): Payer: Medicare Other

## 2020-02-05 DIAGNOSIS — R0602 Shortness of breath: Secondary | ICD-10-CM | POA: Insufficient documentation

## 2020-02-05 DIAGNOSIS — E785 Hyperlipidemia, unspecified: Secondary | ICD-10-CM | POA: Diagnosis not present

## 2020-02-05 DIAGNOSIS — R05 Cough: Secondary | ICD-10-CM | POA: Diagnosis not present

## 2020-02-05 DIAGNOSIS — Z7984 Long term (current) use of oral hypoglycemic drugs: Secondary | ICD-10-CM | POA: Diagnosis not present

## 2020-02-05 DIAGNOSIS — F410 Panic disorder [episodic paroxysmal anxiety] without agoraphobia: Secondary | ICD-10-CM | POA: Insufficient documentation

## 2020-02-05 DIAGNOSIS — K219 Gastro-esophageal reflux disease without esophagitis: Secondary | ICD-10-CM | POA: Insufficient documentation

## 2020-02-05 DIAGNOSIS — Z8673 Personal history of transient ischemic attack (TIA), and cerebral infarction without residual deficits: Secondary | ICD-10-CM | POA: Diagnosis not present

## 2020-02-05 DIAGNOSIS — J441 Chronic obstructive pulmonary disease with (acute) exacerbation: Secondary | ICD-10-CM | POA: Diagnosis not present

## 2020-02-05 DIAGNOSIS — I10 Essential (primary) hypertension: Secondary | ICD-10-CM | POA: Insufficient documentation

## 2020-02-05 DIAGNOSIS — Z7902 Long term (current) use of antithrombotics/antiplatelets: Secondary | ICD-10-CM | POA: Insufficient documentation

## 2020-02-05 DIAGNOSIS — J45901 Unspecified asthma with (acute) exacerbation: Secondary | ICD-10-CM | POA: Diagnosis present

## 2020-02-05 DIAGNOSIS — Z20822 Contact with and (suspected) exposure to covid-19: Secondary | ICD-10-CM | POA: Insufficient documentation

## 2020-02-05 DIAGNOSIS — F319 Bipolar disorder, unspecified: Secondary | ICD-10-CM | POA: Insufficient documentation

## 2020-02-05 DIAGNOSIS — E119 Type 2 diabetes mellitus without complications: Secondary | ICD-10-CM

## 2020-02-05 DIAGNOSIS — Z6838 Body mass index (BMI) 38.0-38.9, adult: Secondary | ICD-10-CM | POA: Diagnosis not present

## 2020-02-05 DIAGNOSIS — H9191 Unspecified hearing loss, right ear: Secondary | ICD-10-CM | POA: Diagnosis not present

## 2020-02-05 DIAGNOSIS — Z7951 Long term (current) use of inhaled steroids: Secondary | ICD-10-CM | POA: Insufficient documentation

## 2020-02-05 DIAGNOSIS — E1142 Type 2 diabetes mellitus with diabetic polyneuropathy: Secondary | ICD-10-CM | POA: Diagnosis not present

## 2020-02-05 DIAGNOSIS — J4521 Mild intermittent asthma with (acute) exacerbation: Principal | ICD-10-CM | POA: Insufficient documentation

## 2020-02-05 DIAGNOSIS — Z79899 Other long term (current) drug therapy: Secondary | ICD-10-CM | POA: Insufficient documentation

## 2020-02-05 LAB — CBC WITH DIFFERENTIAL/PLATELET
Abs Immature Granulocytes: 0.03 10*3/uL (ref 0.00–0.07)
Basophils Absolute: 0.1 10*3/uL (ref 0.0–0.1)
Basophils Relative: 1 %
Eosinophils Absolute: 0.1 10*3/uL (ref 0.0–0.5)
Eosinophils Relative: 2 %
HCT: 36.9 % — ABNORMAL LOW (ref 39.0–52.0)
Hemoglobin: 11.4 g/dL — ABNORMAL LOW (ref 13.0–17.0)
Immature Granulocytes: 0 %
Lymphocytes Relative: 23 %
Lymphs Abs: 1.6 10*3/uL (ref 0.7–4.0)
MCH: 26.7 pg (ref 26.0–34.0)
MCHC: 30.9 g/dL (ref 30.0–36.0)
MCV: 86.4 fL (ref 80.0–100.0)
Monocytes Absolute: 0.5 10*3/uL (ref 0.1–1.0)
Monocytes Relative: 8 %
Neutro Abs: 4.6 10*3/uL (ref 1.7–7.7)
Neutrophils Relative %: 66 %
Platelets: 240 10*3/uL (ref 150–400)
RBC: 4.27 MIL/uL (ref 4.22–5.81)
RDW: 16.3 % — ABNORMAL HIGH (ref 11.5–15.5)
WBC: 6.9 10*3/uL (ref 4.0–10.5)
nRBC: 0 % (ref 0.0–0.2)

## 2020-02-05 LAB — BASIC METABOLIC PANEL
Anion gap: 8 (ref 5–15)
BUN: 26 mg/dL — ABNORMAL HIGH (ref 8–23)
CO2: 25 mmol/L (ref 22–32)
Calcium: 8.6 mg/dL — ABNORMAL LOW (ref 8.9–10.3)
Chloride: 105 mmol/L (ref 98–111)
Creatinine, Ser: 1.14 mg/dL (ref 0.61–1.24)
GFR calc Af Amer: >60 mL/min (ref >60)
GFR calc non Af Amer: >60 mL/min (ref >60)
Glucose, Bld: 148 mg/dL — ABNORMAL HIGH (ref 70–99)
Potassium: 4.3 mmol/L (ref 3.5–5.1)
Sodium: 138 mmol/L (ref 135–145)

## 2020-02-05 LAB — BRAIN NATRIURETIC PEPTIDE: B Natriuretic Peptide: 52.7 pg/mL (ref 0.0–100.0)

## 2020-02-05 LAB — SARS CORONAVIRUS 2 AG (30 MIN TAT): SARS Coronavirus 2 Ag: NEGATIVE

## 2020-02-05 LAB — TROPONIN I (HIGH SENSITIVITY): Troponin I (High Sensitivity): 4 ng/L (ref <18)

## 2020-02-05 MED ORDER — ALBUTEROL (5 MG/ML) CONTINUOUS INHALATION SOLN
10.0000 mg/h | INHALATION_SOLUTION | RESPIRATORY_TRACT | Status: AC
  Administered 2020-02-05: 10 mg/h via RESPIRATORY_TRACT
  Filled 2020-02-05: qty 20

## 2020-02-05 MED ORDER — MAGNESIUM SULFATE 2 GM/50ML IV SOLN
2.0000 g | Freq: Once | INTRAVENOUS | Status: AC
  Administered 2020-02-05: 2 g via INTRAVENOUS
  Filled 2020-02-05: qty 50

## 2020-02-05 MED ORDER — IPRATROPIUM BROMIDE 0.02 % IN SOLN
0.5000 mg | Freq: Once | RESPIRATORY_TRACT | Status: AC
  Administered 2020-02-05: 0.5 mg via RESPIRATORY_TRACT
  Filled 2020-02-05: qty 2.5

## 2020-02-05 MED ORDER — METHYLPREDNISOLONE SODIUM SUCC 125 MG IJ SOLR
125.0000 mg | Freq: Once | INTRAMUSCULAR | Status: AC
  Administered 2020-02-05: 125 mg via INTRAVENOUS
  Filled 2020-02-05: qty 2

## 2020-02-05 NOTE — ED Provider Notes (Signed)
[facility] Provider Note   CSN: [id number] Arrival date & time: 02/05/20  2046     History Chief Complaint  Patient presents with  . Wheezing  . Cough    Ryan Zhang is a 74 y.o. male.  The history is provided by the patient.  Cough Cough characteristics:  Non-productive Sputum characteristics:  Nondescript Severity:  Mild Onset quality:  Gradual Timing:  Intermittent Progression:  Waxing and waning Chronicity:  New Smoker: no   Context: not sick contacts and not upper respiratory infection   Relieved by:  Ipratropium inhaler Worsened by:  Deep breathing and activity Associated symptoms: shortness of breath and wheezing   Associated symptoms: no chest pain, no chills, no diaphoresis, no ear fullness, no ear pain, no eye discharge, no fever, no headaches, no rash and no sore throat   Risk factors comment:  Hx of COPD      Past Medical History:  Diagnosis Date  . Allergy    allergy shots Dr. Velora Heckler  . Anemia   . Anxiety   . Asthma    moderate persistant  . Bipolar affective ([place])   . Cataract    both eyes  . COPD (chronic obstructive pulmonary disease) ([place])   . Depression   . Diabetes mellitus   . DJD (degenerative joint disease)   . Dysphagia   . Gallstones    hx of, s/p cholecystectomy  . GERD (gastroesophageal reflux disease)   . Hepatitis A    as teenager 75's  . Hollenhorst plaque    right eye  . Hyperlipidemia   . Hypertension   . Kidney stones    hx of  . Meniere's disease   . Motility disorder, esophageal   . Obesity   . Pancreatitis   . Personal history of colonic polyps 11/2004   hyperplastic.  . Stroke [facility])    per MRI    Patient Active Problem List   Diagnosis Date Noted  . CAP (community acquired pneumonia) 07/18/2018  . Acute respiratory failure ([place]) 07/18/2018  . Acute respiratory failure with hypoxia ([place]) 07/18/2018  . PCP NOTES >>>>>>>>>>>>>>>>>>>>>>>>>>>>>>> 01/12/2016  . Dry skin  05/09/2015  . Folliculitis 32/10/2481  . Hearing loss in right ear 08/05/2014  . Pre-ulcerative corn or callous 12/27/2013  . Panic attack as reaction to stress 04/20/2012  . Dysphagia, neurologic 01/07/2012  . Morbid obesity ([place]) 01/07/2012  . Chest pain, musculoskeletal 12/07/2011  . Black-out (not amnesia) 12/07/2011  . Candidiasis of skin 08/12/2011  . Annual physical exam 06/07/2011  . OLECRANON BURSITIS 01/29/2011  . PARTIAL ARTERIAL OCCLUSION OF RETINA 05/27/2010  . Essential hypertension, benign 01/16/2010  . ABNORMAL ELECTROCARDIOGRAM 01/14/2010  . Asthma exacerbation 11/26/2009  . ASTHMA, MILD, INTERMITTENT 10/14/2009  . GERD 10/14/2009  . LACERATION OF FINGER 05/14/2009  . Bipolar disorder ([place]) 02/20/2009  . PHIMOSIS 02/20/2009  . KNEE PAIN, CHRONIC 11/20/2008  . HIP PAIN, RIGHT 08/19/2008  . DM type 2 with diabetic peripheral neuropathy ([place]) 02/22/2007  . Hyperlipidemia associated with type 2 diabetes mellitus ([place]) 02/22/2007  . ALLERGIC RHINITIS, SEASONAL 02/22/2007  . LOW BACK PAIN, CHRONIC 02/22/2007  . MENIERE'S DISEASE, HX OF 02/22/2007    Past Surgical History:  Procedure Laterality Date  . CATARACT EXTRACTION Bilateral 09/2015 and 10/2015  . CHOLECYSTECTOMY    . COLONOSCOPY    . TOTAL KNEE ARTHROPLASTY Bilateral    both knees       Family History  Problem Relation Age of Onset  .  Diabetes Father   . Heart disease Brother   . Heart disease Maternal Grandmother   . Cancer Sister        unknown type  . Colon cancer Neg Hx   . Prostate cancer Neg Hx   . Colon polyps Neg Hx   . Esophageal cancer Neg Hx   . Rectal cancer Neg Hx   . Stomach cancer Neg Hx     Social History   Tobacco Use  . Smoking status: Never Smoker  . Smokeless tobacco: Never Used  Substance Use Topics  . Alcohol use: Not Currently  . Drug use: No    Home Medications Prior to Admission medications   Medication Sig Start Date End Date Taking? Authorizing Provider    albuterol (PROVENTIL) (2.5 MG/3ML) 0.083% nebulizer solution Take 3 mLs (2.5 mg total) by nebulization every 6 (six) hours as needed for wheezing or shortness of breath. 01/24/18   Colon Branch, MD  asenapine (SAPHRIS) 5 MG SUBL Place 5 mg under the tongue at bedtime.     [provider]  augmented betamethasone dipropionate (DIPROLENE-AF) 0.05 % ointment Apply topically 2 (two) times daily as needed. Patient taking differently: Apply 1 application topically 2 (two) times daily as needed (yeast infection).  03/13/18   Colon Branch, MD  budesonide-formoterol [facility]) 160-4.5 MCG/ACT inhaler Inhale 2 puffs into the lungs 2 (two) times daily.     [provider]  clonazePAM (KLONOPIN) 0.5 MG tablet Take 0.5 mg by mouth 2 (two) times daily. 1 mg in the morning and 0.5 mg in the evening.    [provider]  clopidogrel (PLAVIX) 75 MG tablet Take 1 tablet (75 mg total) by mouth daily. 11/05/19   Colon Branch, MD  DULoxetine (CYMBALTA) 60 MG capsule Take 60 mg by mouth every morning.     [provider]  EPIPEN 2-PAK 0.3 MG/0.3ML SOAJ injection Reported on 04/29/2016 07/04/14   [provider]  esomeprazole (NEXIUM) 40 MG capsule Take 80 mg by mouth every morning.     [provider]  fexofenadine (ALLEGRA) 180 MG tablet Take 180 mg by mouth every morning.     [provider]  fluticasone (VERAMYST) 27.5 MCG/SPRAY nasal spray Place 2 sprays into the nose daily as needed for rhinitis or allergies.     [provider]  ketoconazole (NIZORAL) 2 % cream apply to the affected topical area once daily 11/21/19   Colon Branch, MD  lamoTRIgine (LAMICTAL) 25 MG tablet Take 50 mg by mouth at bedtime.    [provider]  Lancets [name] ULTRASOFT) lancets Check blood sugars no more than twice daily 12/09/16   Colon Branch, MD  losartan (COZAAR) 25 MG tablet TAKE 1 TABLET BY MOUTH EVERY EVENING  01/21/20   Colon Branch, MD  Multiple Vitamin  (MULTIVITAMIN) tablet Take 1 tablet by mouth daily.     [provider]  [facility] VERIO test strip use to check blood sugar no more than twice daily 12/03/19   Colon Branch, MD  pioglitazone-metformin (ACTOPLUS MET) [phone] MG tablet TAKE ONE TABLET BY MOUTH TWICE DAILY WITH MEALS 11/20/19   Colon Branch, MD  simvastatin (ZOCOR) 40 MG tablet Take 1 tablet (40 mg total) by mouth at bedtime. 01/14/20   Colon Branch, MD  solifenacin (VESICARE) 10 MG tablet Take 10 mg by mouth daily. 10/11/19   [provider]  tamsulosin (FLOMAX) 0.4 MG CAPS capsule Take 1 capsule (  0.4 mg total) by mouth daily after supper. 07/30/19   Colon Branch, MD  traZODone (DESYREL) 50 MG tablet Take 50 mg by mouth at bedtime as needed for sleep.  07/09/14   Midge Minium, MD    Allergies    Celexa  [citalopram hydrobromide], Depakote  [divalproex sodium], Methocarbamol, Paroxetine hcl, Smz-tmp ds [sulfamethoxazole-trimethoprim], Divalproex sodium, Methocarbamol, Other, Paroxetine, Percocet [oxycodone-acetaminophen], Risperidone, Wellbutrin [bupropion], and Citalopram hydrobromide  Review of Systems   Review of Systems  Constitutional: Negative for chills, diaphoresis and fever.  HENT: Negative for ear pain and sore throat.   Eyes: Negative for pain, discharge and visual disturbance.  Respiratory: Positive for cough, shortness of breath and wheezing.   Cardiovascular: Negative for chest pain and palpitations.  Gastrointestinal: Negative for abdominal pain and vomiting.  Genitourinary: Negative for dysuria and hematuria.  Musculoskeletal: Negative for arthralgias and back pain.  Skin: Negative for color change and rash.  Neurological: Negative for seizures, syncope and headaches.  All other systems reviewed and are negative.   Physical Exam Updated Vital Signs  ED Triage Vitals  Enc Vitals Group     BP 02/05/20 2053 112/73     Pulse Rate 02/05/20 2053 91     Resp 02/05/20 2053 20     Temp 02/05/20  2053 97.8 F (36.6 C)     Temp Source 02/05/20 2053 Oral     SpO2 02/05/20 2053 95 %     Weight 02/05/20 2050 263 lb (119.3 kg)     Height 02/05/20 2050 5' 9.5" (1.765 m)     Head Circumference --      Peak Flow --      Pain Score 02/05/20 2050 0     Pain Loc --      Pain Edu? --      Excl. in [place]? --     Physical Exam Vitals and nursing note reviewed.  Constitutional:      General: He is not in acute distress.    Appearance: He is well-developed. He is not ill-appearing.  HENT:     Head: Normocephalic and atraumatic.     Nose: Nose normal.     Mouth/Throat:     Mouth: Mucous membranes are moist.  Eyes:     Extraocular Movements: Extraocular movements intact.     Conjunctiva/sclera: Conjunctivae normal.     Pupils: Pupils are equal, round, and reactive to light.  Cardiovascular:     Rate and Rhythm: Normal rate and regular rhythm.     Pulses: Normal pulses.     Heart sounds: Normal heart sounds. No murmur.  Pulmonary:     Effort: No respiratory distress.     Breath sounds: Wheezing present. No rhonchi.     Comments: Mild increased work of breathing Abdominal:     General: There is no distension.     Palpations: Abdomen is soft.     Tenderness: There is no abdominal tenderness.  Musculoskeletal:        General: No tenderness. Normal range of motion.     Cervical back: Normal range of motion and neck supple.     Right lower leg: Edema (trace) present.     Left lower leg: Edema (trace) present.  Skin:    General: Skin is warm and dry.     Capillary Refill: Capillary refill takes less than 2 seconds.  Neurological:     General: No focal deficit present.     Mental Status: He is alert.  Psychiatric:  Mood and Affect: Mood normal.     ED Results / Procedures / Treatments   Labs (all labs ordered are listed, but only abnormal results are displayed) Labs Reviewed  CBC WITH DIFFERENTIAL/PLATELET - Abnormal; Notable for the following components:      Result  Value   Hemoglobin 11.4 (*)    HCT 36.9 (*)    RDW 16.3 (*)    All other components within normal limits  BASIC METABOLIC PANEL - Abnormal; Notable for the following components:   Glucose, Bld 148 (*)    BUN 26 (*)    Calcium 8.6 (*)    All other components within normal limits  SARS CORONAVIRUS 2 AG (30 MIN TAT)  SARS CORONAVIRUS 2 (TAT 6-24 HRS)  BRAIN NATRIURETIC PEPTIDE  TROPONIN I (HIGH SENSITIVITY)    EKG None  Radiology DG Chest Portable 1 View  Result Date: 02/05/2020 CLINICAL DATA:  Wheezing, cough EXAM: PORTABLE CHEST 1 VIEW COMPARISON:  11/07/2019 FINDINGS: Cardiomegaly. No confluent airspace opacities or effusions. Areas of scarring in the mid and lower lungs. No acute bony abnormality. IMPRESSION: No acute cardiopulmonary disease. Electronically Signed   By: Rolm Baptise M.D.   On: 02/05/2020 21:26    Procedures .Critical Care Performed by: Lennice Sites, DO Authorized by: Lennice Sites, DO   Critical care provider statement:    Critical care time (minutes):  35   Critical care was necessary to treat or prevent imminent or life-threatening deterioration of the following conditions:  Respiratory failure   Critical care was time spent personally by me on the following activities:  Blood draw for specimens, development of treatment plan with patient or surrogate, discussions with primary provider, evaluation of patient's response to treatment, obtaining history from patient or surrogate, examination of patient, ordering and performing treatments and interventions, ordering and review of laboratory studies, ordering and review of radiographic studies, pulse oximetry, review of old charts and re-evaluation of patient's condition   I assumed direction of critical care for this patient from another provider in my specialty: no     (including critical care time)  Medications Ordered in ED Medications  albuterol (PROVENTIL,VENTOLIN) solution continuous neb (10 mg/hr  Nebulization New Bag/Given 02/05/20 2113)  magnesium sulfate IVPB 2 g 50 mL (has no administration in time range)  ipratropium (ATROVENT) nebulizer solution 0.5 mg (0.5 mg Nebulization Given 02/05/20 2113)  methylPREDNISolone sodium succinate (SOLU-MEDROL) 125 mg/2 mL injection 125 mg (125 mg Intravenous Given 02/05/20 2130)    ED Course  I have reviewed the triage vital signs and the nursing notes.  Pertinent labs & imaging results that were available during my care of the patient were reviewed by me and considered in my medical decision making (see chart for details).    MDM Rules/Calculators/A&P                      Ryan Zhang is a 74 year old male with history of COPD, hypertension, high cholesterol, diabetes who presents to the ED with cough, wheezing, shortness of breath.  Patient with unremarkable vitals except for increased work of breathing.  Normal room air oxygenation.  Patient has diminished breath sounds throughout with poor effort.  Has some trace edema in his legs.  No history of heart failure.  Suspect COPD exacerbation.  No chest pain.  Less likely ACS or heart failure.  Will get lab work including chest x-ray.  Will evaluate with troponin and BNP.  Patient started on a continuous nebulizer  of albuterol and Atrovent.  Patient has completed his Covid vaccination.  Point-of-care Covid test was negative.  Patient to be placed on 2-hour continuous nebulizer.  Chest x-ray shows no signs of infection.  No pulmonary edema.  Troponin and BNP are unremarkable.  Doubt ACS or CHF.  Suspect primary COPD exacerbation.  Patient has no significant anemia, leukocytosis, electrolyte abnormality.  Patient improving with albuterol.  Patient was given IV Solu-Medrol and IV magnesium as well.  However given his work of breathing will admit for observation.  Hemodynamically stable.  This chart was dictated using voice recognition software.  Despite best efforts to proofread,  errors can occur which can  change the documentation meaning.    Final Clinical Impression(s) / ED Diagnoses Final diagnoses:  COPD exacerbation [facility])    Rx / DC Orders ED Discharge Orders    None       Lennice Sites, DO 02/05/20 2252

## 2020-02-05 NOTE — ED Triage Notes (Signed)
Asthma COPD. Here tonight with wheezing and cough. Pale.

## 2020-02-06 DIAGNOSIS — J4521 Mild intermittent asthma with (acute) exacerbation: Secondary | ICD-10-CM

## 2020-02-06 DIAGNOSIS — E119 Type 2 diabetes mellitus without complications: Secondary | ICD-10-CM

## 2020-02-06 LAB — GLUCOSE, CAPILLARY
Glucose-Capillary: 236 mg/dL — ABNORMAL HIGH (ref 70–99)
Glucose-Capillary: 221 mg/dL — ABNORMAL HIGH (ref 70–99)

## 2020-02-06 LAB — HIV ANTIBODY (ROUTINE TESTING W REFLEX): HIV Screen 4th Generation wRfx: NONREACTIVE

## 2020-02-06 LAB — HEMOGLOBIN A1C
Hgb A1c MFr Bld: 6.6 % — ABNORMAL HIGH (ref 4.8–5.6)
Mean Plasma Glucose: 142.72 mg/dL

## 2020-02-06 LAB — SARS CORONAVIRUS 2 (TAT 6-24 HRS): SARS Coronavirus 2: NEGATIVE

## 2020-02-06 MED ORDER — ENOXAPARIN SODIUM 40 MG/0.4ML ~~LOC~~ SOLN
40.0000 mg | SUBCUTANEOUS | Status: DC
  Administered 2020-02-06: 40 mg via SUBCUTANEOUS
  Filled 2020-02-06: qty 0.4

## 2020-02-06 MED ORDER — BUDESONIDE 0.5 MG/2ML IN SUSP: 2.0000 mg | Freq: Two times a day (BID) | RESPIRATORY_TRACT | Status: DC

## 2020-02-06 MED ORDER — METHYLPREDNISOLONE SODIUM SUCC 125 MG IJ SOLR
60.0000 mg | Freq: Four times a day (QID) | INTRAMUSCULAR | Status: DC
  Administered 2020-02-06 (×2): 60 mg via INTRAVENOUS
  Filled 2020-02-06 (×2): qty 2

## 2020-02-06 MED ORDER — PREDNISONE 20 MG PO TABS: 40.0000 mg | ORAL_TABLET | Freq: Every day | ORAL | Status: DC

## 2020-02-06 MED ORDER — SODIUM CHLORIDE 0.9 % IV SOLN
1.0000 g | Freq: Every day | INTRAVENOUS | Status: DC
  Filled 2020-02-06: qty 10

## 2020-02-06 MED ORDER — INSULIN ASPART 100 UNIT/ML ~~LOC~~ SOLN
0.0000 [IU] | Freq: Three times a day (TID) | SUBCUTANEOUS | Status: DC
  Administered 2020-02-06 (×2): 3 [IU] via SUBCUTANEOUS

## 2020-02-06 MED ORDER — PREDNISONE 10 MG PO TABS: ORAL_TABLET | ORAL | 0 refills | Status: DC

## 2020-02-06 MED ORDER — ALBUTEROL SULFATE (2.5 MG/3ML) 0.083% IN NEBU: 2.5000 mg | INHALATION_SOLUTION | RESPIRATORY_TRACT | Status: DC | PRN

## 2020-02-06 MED ORDER — ACETAMINOPHEN 325 MG PO TABS
650.0000 mg | ORAL_TABLET | Freq: Four times a day (QID) | ORAL | Status: DC | PRN
  Administered 2020-02-06: 650 mg via ORAL
  Filled 2020-02-06: qty 2

## 2020-02-06 MED ORDER — IPRATROPIUM-ALBUTEROL 0.5-2.5 (3) MG/3ML IN SOLN: 3.0000 mL | Freq: Four times a day (QID) | RESPIRATORY_TRACT | Status: DC

## 2020-02-06 MED ORDER — ACETAMINOPHEN 325 MG PO TABS: 650.0000 mg | ORAL_TABLET | Freq: Four times a day (QID) | ORAL | Status: DC | PRN

## 2020-02-06 MED ORDER — INSULIN ASPART 100 UNIT/ML ~~LOC~~ SOLN: 0.0000 [IU] | Freq: Every day | SUBCUTANEOUS | Status: DC

## 2020-02-06 NOTE — Discharge Summary (Signed)
Physician Discharge Summary  Ryan Zhang RDE:[id number] DOB: 1946/03/18 DOA: 02/05/2020  PCP: Colon Branch, MD  Admit date: 02/05/2020 Discharge date: 02/06/2020  Admitted From home  disposition: Home Recommendations for Outpatient Follow-up:  1. Follow up with PCP in 1-2 weeks 2. Please obtain BMP/CBC in one week  Home Health: None  equipment/Devices none Discharge Condition: Stable and improved CODE STATUS: Full code Diet recommendation: Cardiac diet Brief/Interim Summary: 74 y.o. male with medical history significant of asthma, bipolar disorder, depression, anxiety, diabetes, GERD, hypertension, hyperlipidemia, CVA, obesity (BMI 38) presented to [facility] ED with complaints of wheezing, cough, and shortness of breath.  Patient states he was doing fine but since yesterday afternoon started having a lot of wheezing, cough, and shortness of breath.  States when he arrived the ED he could hardly breathe but now after receiving breathing treatments his symptoms have improved significantly.  Reports using Symbicort at home.  Denies fevers or chest pain.  States he has already received both of his Covid vaccines, second dose was 3/3.  ED Course: Patient had increased work of breathing, diminished breath sounds with poor effort.  He was given continuous albuterol nebulizer treatment and Atrovent.  He was given IV magnesium 2 g and Solu-Medrol 125 mg.  Afebrile.  Labs showing no leukocytosis.  SARS-CoV-2 rapid antigen test negative, PCR test pending.  High-sensitivity troponin negative.  EKG not suggestive of ACS.  BNP normal.   Chest x-ray showing no acute cardiopulmonary disease.  Discharge Diagnoses:  Principal Problem:   Asthma exacerbation Active Problems:   Essential hypertension, benign   Type 2 diabetes mellitus ([place])   #1 acute asthma exacerbation patient was treated with IV steroids and duo nebs and nebulizer and Pulmicort.  He was on room air when I saw him and he did  well walking with physical therapy without any desaturation.  We will plan to discharge him home today on prednisone taper and to continue all of his home medications.  #2 type 2 diabetes continue home meds  #3 hypertension continue home meds Estimated body mass index is 38.28 kg/m as calculated from the following:   Height as of this encounter: 5' 9.5" (1.765 m).   Weight as of this encounter: 119.3 kg.  Discharge Instructions  Discharge Instructions    Call MD for:  difficulty breathing, headache or visual disturbances   Complete by: As directed    Call MD for:  persistant nausea and vomiting   Complete by: As directed    Diet - low sodium heart healthy   Complete by: As directed    Increase activity slowly   Complete by: As directed      Allergies as of 02/06/2020      Reactions   Celexa  [citalopram Hydrobromide] Other (See Comments)   Depakote  [divalproex Sodium] Other (See Comments)   Methocarbamol    Other reaction(s): Hallucinations   Paroxetine Hcl    Other reaction(s): Insomnia   Smz-tmp Ds [sulfamethoxazole-trimethoprim] Nausea And Vomiting   Divalproex Sodium Other (See Comments)   Pancreatitis    Methocarbamol Other (See Comments)   hallucinations   Other    "Symbrax" alteration in blood sugar, pt unsure if low or high blood sugar   Paroxetine Other (See Comments)   Insomnia   Percocet [oxycodone-acetaminophen] Other (See Comments)   Hyperactivity   Risperidone Other (See Comments)   REACTION: hyper   Wellbutrin [bupropion] Other (See Comments)   Hot flashes   Citalopram Hydrobromide  Other (See Comments)   Insomnia      Medication List    TAKE these medications   acetaminophen 325 MG tablet Commonly known as: TYLENOL Take 2 tablets (650 mg total) by mouth every 6 (six) hours as needed (mild pain; headache).   albuterol (2.5 MG/3ML) 0.083% nebulizer solution Commonly known as: PROVENTIL Take 3 mLs (2.5 mg total) by nebulization every 6 (six)  hours as needed for wheezing or shortness of breath.   augmented betamethasone dipropionate 0.05 % ointment Commonly known as: DIPROLENE-AF Apply topically 2 (two) times daily as needed. What changed:   how much to take  reasons to take this   budesonide-formoterol 160-4.5 MCG/ACT inhaler Commonly known as: SYMBICORT Inhale 2 puffs into the lungs 2 (two) times daily.   clonazePAM 0.5 MG tablet Commonly known as: KLONOPIN Take 0.5 mg by mouth See admin instructions. 0.5 mg at 2 PM and 1 mg in the evening.   clopidogrel 75 MG tablet Commonly known as: PLAVIX Take 1 tablet (75 mg total) by mouth daily.   Cymbalta 60 MG capsule Generic drug: DULoxetine Take 60 mg by mouth every morning.   EpiPen 2-Pak 0.3 mg/0.3 mL Soaj injection Generic drug: EPINEPHrine Reported on 04/29/2016   esomeprazole 40 MG capsule Commonly known as: NEXIUM Take 40 mg by mouth every morning.   fexofenadine 180 MG tablet Commonly known as: ALLEGRA Take 180 mg by mouth every morning.   ketoconazole 2 % cream Commonly known as: NIZORAL apply to the affected topical area once daily What changed: See the new instructions.   lamoTRIgine 25 MG tablet Commonly known as: LAMICTAL Take 50 mg by mouth at bedtime.   losartan 25 MG tablet Commonly known as: COZAAR TAKE 1 TABLET BY MOUTH EVERY EVENING What changed: when to take this   multivitamin tablet Take 1 tablet by mouth daily.   onetouch ultrasoft lancets Check blood sugars no more than twice daily   OneTouch Verio test strip Generic drug: glucose blood use to check blood sugar no more than twice daily   pioglitazone-metformin 15-850 MG tablet Commonly known as: ACTOPLUS MET TAKE ONE TABLET BY MOUTH TWICE DAILY WITH MEALS   predniSONE 10 MG tablet Commonly known as: DELTASONE Take 3 tablets daily for 3 days then 2 tablets daily for 3 days then 1 tablet daily till all tablets are done.   Saphris 5 MG Subl 24 hr tablet Generic drug:  asenapine Place 5 mg under the tongue at bedtime.   simvastatin 40 MG tablet Commonly known as: ZOCOR Take 1 tablet (40 mg total) by mouth at bedtime.   solifenacin 10 MG tablet Commonly known as: VESICARE Take 10 mg by mouth daily.   tamsulosin 0.4 MG Caps capsule Commonly known as: FLOMAX Take 1 capsule (0.4 mg total) by mouth daily after supper.   traZODone 50 MG tablet Commonly known as: DESYREL Take 50 mg by mouth at bedtime as needed for sleep.   Veramyst 27.5 MCG/SPRAY nasal spray Generic drug: fluticasone Place 2 sprays into the nose daily as needed for rhinitis or allergies.      Follow-up [place], MD Follow up.   Specialty: Internal Medicine Contact information: [address] [place] [address] [phone]          Allergies  Allergen Reactions  . Celexa  [Citalopram Hydrobromide] Other (See Comments)  . Depakote  [Divalproex Sodium] Other (See Comments)  . Methocarbamol  Other reaction(s): Hallucinations  . Paroxetine Hcl     Other reaction(s): Insomnia  . Smz-Tmp Ds [Sulfamethoxazole-Trimethoprim] Nausea And Vomiting  . Divalproex Sodium Other (See Comments)    Pancreatitis   . Methocarbamol Other (See Comments)    hallucinations  . Other     "Symbrax" alteration in blood sugar, pt unsure if low or high blood sugar  . Paroxetine Other (See Comments)    Insomnia  . Percocet [Oxycodone-Acetaminophen] Other (See Comments)    Hyperactivity  . Risperidone Other (See Comments)    REACTION: hyper  . Wellbutrin [Bupropion] Other (See Comments)    Hot flashes  . Citalopram Hydrobromide Other (See Comments)    Insomnia    Consultations:  None   Procedures/Studies: DG Chest Portable 1 View  Result Date: 02/05/2020 CLINICAL DATA:  Wheezing, cough EXAM: PORTABLE CHEST 1 VIEW COMPARISON:  11/07/2019 FINDINGS: Cardiomegaly. No confluent airspace opacities or effusions. Areas of scarring in the mid and lower  lungs. No acute bony abnormality. IMPRESSION: No acute cardiopulmonary disease. Electronically Signed   By: Rolm Baptise M.D.   On: 02/05/2020 21:26    (Echo, Carotid, EGD, Colonoscopy, ERCP)    Subjective: Patient resting in bed awake alert asking for Tylenol for headache no nausea vomiting breathing better and feels it is back to baseline  Discharge Exam: Vitals:   02/06/20 0542 02/06/20 0811  BP: 135/69   Pulse: 88   Resp: 17   Temp: 98.3 F (36.8 C)   SpO2: 94% 95%   Vitals:   02/05/20 2345 02/06/20 0151 02/06/20 0542 02/06/20 0811  BP: 118/70 (!) 146/75 135/69   Pulse: 91 100 88   Resp: _0 Temp: 98.1 F (36.7 C) 98.9 F (37.2 C) 98.3 F (36.8 C)   TempSrc: Oral Oral Oral   SpO2: 95% 93% 94% 95%  Weight:      Height:        General: Pt is alert, awake, not in acute distress Cardiovascular: RRR, S1/S2 +, no rubs, no gallops Respiratory:whezzing few   bilaterally, no wheezing, no rhonchi Abdominal: Soft, NT, ND, bowel sounds + Extremities: no edema, no cyanosis    The results of significant diagnostics from this hospitalization (including imaging, microbiology, ancillary and laboratory) are listed below for reference.     Microbiology: Recent Results (from the past 240 hour(s))  SARS Coronavirus 2 Ag (30 min TAT) - Nasal Swab (BD Veritor Kit)     Status: None   Collection Time: 02/05/20  9:31 PM   Specimen: Nasal Swab (BD Veritor Kit)  Result Value Ref Range Status   SARS Coronavirus 2 Ag NEGATIVE NEGATIVE Final    Comment: (NOTE) SARS-CoV-2 antigen NOT DETECTED.  Negative results are presumptive.  Negative results do not preclude SARS-CoV-2 infection and should not be used as the sole basis for treatment or other patient management decisions, including infection  control decisions, particularly in the presence of clinical signs and  symptoms consistent with COVID-19, or in those who have been in contact with the virus.  Negative results must be  combined with clinical observations, patient history, and epidemiological information. The expected result is Negative. Fact Sheet for Patients: PodPark.tn Fact Sheet for Healthcare Providers: GiftContent.is This test is not yet approved or cleared by the [place] FDA and  has been authorized for detection and/or diagnosis of SARS-CoV-2 by FDA under an Emergency Use Authorization (EUA).  This EUA will remain in effect (meaning this test can be used) for  the duration of  the COVID-19 de claration under Section 564(b)(1) of the Act, 21 U.S.C. section 360bbb-3(b)(1), unless the authorization is terminated or revoked sooner. Performed at [facility], [address] [place], [place] [address]      Labs: BNP (last 3 results) Recent Labs    02/05/20 2131  BNP 95.1   Basic Metabolic Panel: Recent Labs  Lab 02/05/20 2131  NA 138  K 4.3  CL 105  CO2 25  GLUCOSE 148*  BUN 26*  CREATININE 1.14  CALCIUM 8.6*   Liver Function Tests: No results for input(s): AST, ALT, ALKPHOS, BILITOT, PROT, ALBUMIN in the last 168 hours. No results for input(s): LIPASE, AMYLASE in the last 168 hours. No results for input(s): AMMONIA in the last 168 hours. CBC: Recent Labs  Lab 02/05/20 2131  WBC 6.9  NEUTROABS 4.6  HGB 11.4*  HCT 36.9*  MCV 86.4  PLT 240   Cardiac Enzymes: No results for input(s): CKTOTAL, CKMB, CKMBINDEX, TROPONINI in the last 168 hours. BNP: Invalid input(s): POCBNP CBG: Recent Labs  Lab 02/06/20 0820  GLUCAP 236*   D-Dimer No results for input(s): DDIMER in the last 72 hours. Hgb A1c Recent Labs    02/06/20 0530  HGBA1C 6.6*   Lipid Profile No results for input(s): CHOL, HDL, LDLCALC, TRIG, CHOLHDL, LDLDIRECT in the last 72 hours. Thyroid function studies No results for input(s): TSH, T4TOTAL, T3FREE, THYROIDAB in the last 72 hours.  Invalid input(s): FREET3 Anemia work up No  results for input(s): VITAMINB12, FOLATE, FERRITIN, TIBC, IRON, RETICCTPCT in the last 72 hours. Urinalysis    Component Value Date/Time   COLORURINE dark Red (A) 07/02/2019 1611   APPEARANCEUR Cloudy (A) 07/02/2019 1611   LABSPEC 1.025 07/02/2019 1611   PHURINE 6.5 07/02/2019 1611   GLUCOSEU NEGATIVE 07/02/2019 1611   HGBUR LARGE (A) 07/02/2019 1611   BILIRUBINUR Moderate (2+) 07/02/2019 1613   BILIRUBINUR MODERATE (A) 07/02/2019 1611   KETONESUR 15 (A) 07/02/2019 1611   PROTEINUR Positive (A) 07/02/2019 1613   PROTEINUR NEGATIVE 06/02/2010 0817   UROBILINOGEN 2.0 (A) 07/02/2019 1613   UROBILINOGEN 4.0 (A) 07/02/2019 1611   NITRITE Positive 07/02/2019 1613   NITRITE POSITIVE (A) 07/02/2019 1611   LEUKOCYTESUR Large (3+) (A) 07/02/2019 1613   LEUKOCYTESUR MODERATE (A) 07/02/2019 1611   Sepsis Labs Invalid input(s): PROCALCITONIN,  WBC,  LACTICIDVEN Microbiology Recent Results (from the past 240 hour(s))  SARS Coronavirus 2 Ag (30 min TAT) - Nasal Swab (BD Veritor Kit)     Status: None   Collection Time: 02/05/20  9:31 PM   Specimen: Nasal Swab (BD Veritor Kit)  Result Value Ref Range Status   SARS Coronavirus 2 Ag NEGATIVE NEGATIVE Final    Comment: (NOTE) SARS-CoV-2 antigen NOT DETECTED.  Negative results are presumptive.  Negative results do not preclude SARS-CoV-2 infection and should not be used as the sole basis for treatment or other patient management decisions, including infection  control decisions, particularly in the presence of clinical signs and  symptoms consistent with COVID-19, or in those who have been in contact with the virus.  Negative results must be combined with clinical observations, patient history, and epidemiological information. The expected result is Negative. Fact Sheet for Patients: PodPark.tn Fact Sheet for Healthcare Providers: GiftContent.is This test is not yet approved or cleared  by the [place] FDA and  has been authorized for detection and/or diagnosis of SARS-CoV-2 by FDA under an Emergency Use Authorization (EUA).  This  EUA will remain in effect (meaning this test can be used) for the duration of  the COVID-19 de claration under Section 564(b)(1) of the Act, 21 U.S.C. section 360bbb-3(b)(1), unless the authorization is terminated or revoked sooner. Performed at [facility], [address], [place], [place] [address]      Time coordinating discharge:  37 minutes  SIGNED:   Georgette Shell, MD  [facility] 02/06/2020, 12:07 PM Pager   If 7PM-7AM, please contact night-coverage [email or web address] Password [email or web address]

## 2020-02-06 NOTE — H&P (Signed)
History and Physical    Ryan Zhang XHB:[id number] DOB: 02/20/1946 DOA: 02/05/2020  PCP: Colon Branch, MD Patient coming from: [facility] ED  Chief Complaint: Wheezing, cough, shortness of breath  HPI: Ryan Zhang is a 74 y.o. male with medical history significant of asthma, bipolar disorder, depression, anxiety, diabetes, GERD, hypertension, hyperlipidemia, CVA, obesity (BMI 38) presented to [facility] with complaints of wheezing, cough, and shortness of breath.  Patient states he was doing fine but since yesterday afternoon started having a lot of wheezing, cough, and shortness of breath.  States when he arrived the ED he could hardly breathe but now after receiving breathing treatments his symptoms have improved significantly.  Reports using Symbicort at home.  Denies fevers or chest pain.  States he has already received both of his Covid vaccines, second dose was 3/3.  ED Course: Patient had increased work of breathing, diminished breath sounds with poor effort.  He was given continuous albuterol nebulizer treatment and Atrovent.  He was given IV magnesium 2 g and Solu-Medrol 125 mg.  Afebrile.  Labs showing no leukocytosis.  SARS-CoV-2 rapid antigen test negative, PCR test pending.  High-sensitivity troponin negative.  EKG not suggestive of ACS.  BNP normal.   Chest x-ray showing no acute cardiopulmonary disease.  Review of Systems:  All systems reviewed and apart from history of presenting illness, are negative.  Past Medical History:  Diagnosis Date  . Allergy    allergy shots Dr. Velora Heckler  . Anemia   . Anxiety   . Asthma    moderate persistant  . Bipolar affective ([place])   . Cataract    both eyes  . COPD (chronic obstructive pulmonary disease) ([place])   . Depression   . Diabetes mellitus   . DJD (degenerative joint disease)   . Dysphagia   . Gallstones    hx of, s/p cholecystectomy  . GERD (gastroesophageal reflux disease)   . Hepatitis A    as  teenager 33's  . Hollenhorst plaque    right eye  . Hyperlipidemia   . Hypertension   . Kidney stones    hx of  . Meniere's disease   . Motility disorder, esophageal   . Obesity   . Pancreatitis   . Personal history of colonic polyps 11/2004   hyperplastic.  . Stroke [facility])    per MRI    Past Surgical History:  Procedure Laterality Date  . CATARACT EXTRACTION Bilateral 09/2015 and 10/2015  . CHOLECYSTECTOMY    . COLONOSCOPY    . TOTAL KNEE ARTHROPLASTY Bilateral    both knees     reports that he has never smoked. He has never used smokeless tobacco. He reports previous alcohol use. He reports that he does not use drugs.  Allergies  Allergen Reactions  . Celexa  [Citalopram Hydrobromide] Other (See Comments)  . Depakote  [Divalproex Sodium] Other (See Comments)  . Methocarbamol     Other reaction(s): Hallucinations  . Paroxetine Hcl     Other reaction(s): Insomnia  . Smz-Tmp Ds [Sulfamethoxazole-Trimethoprim] Nausea And Vomiting  . Divalproex Sodium Other (See Comments)    Pancreatitis   . Methocarbamol Other (See Comments)    hallucinations  . Other     "Symbrax" alteration in blood sugar, pt unsure if low or high blood sugar  . Paroxetine Other (See Comments)    Insomnia  . Percocet [Oxycodone-Acetaminophen] Other (See Comments)    Hyperactivity  . Risperidone Other (See  Comments)    REACTION: hyper  . Wellbutrin [Bupropion] Other (See Comments)    Hot flashes  . Citalopram Hydrobromide Other (See Comments)    Insomnia    Family History  Problem Relation Age of Onset  . Diabetes Father   . Heart disease Brother   . Heart disease Maternal Grandmother   . Cancer Sister        unknown type  . Colon cancer Neg Hx   . Prostate cancer Neg Hx   . Colon polyps Neg Hx   . Esophageal cancer Neg Hx   . Rectal cancer Neg Hx   . Stomach cancer Neg Hx     Prior to Admission medications   Medication Sig Start Date End Date Taking? Authorizing Provider    albuterol (PROVENTIL) (2.5 MG/3ML) 0.083% nebulizer solution Take 3 mLs (2.5 mg total) by nebulization every 6 (six) hours as needed for wheezing or shortness of breath. 01/24/18   Colon Branch, MD  asenapine (SAPHRIS) 5 MG SUBL Place 5 mg under the tongue at bedtime.     [provider]  augmented betamethasone dipropionate (DIPROLENE-AF) 0.05 % ointment Apply topically 2 (two) times daily as needed. Patient taking differently: Apply 1 application topically 2 (two) times daily as needed (yeast infection).  03/13/18   Colon Branch, MD  budesonide-formoterol [facility]) 160-4.5 MCG/ACT inhaler Inhale 2 puffs into the lungs 2 (two) times daily.     [provider]  clonazePAM (KLONOPIN) 0.5 MG tablet Take 0.5 mg by mouth 2 (two) times daily. 1 mg in the morning and 0.5 mg in the evening.    [provider]  clopidogrel (PLAVIX) 75 MG tablet Take 1 tablet (75 mg total) by mouth daily. 11/05/19   Colon Branch, MD  DULoxetine (CYMBALTA) 60 MG capsule Take 60 mg by mouth every morning.     [provider]  EPIPEN 2-PAK 0.3 MG/0.3ML SOAJ injection Reported on 04/29/2016 07/04/14   [provider]  esomeprazole (NEXIUM) 40 MG capsule Take 80 mg by mouth every morning.     [provider]  fexofenadine (ALLEGRA) 180 MG tablet Take 180 mg by mouth every morning.     [provider]  fluticasone (VERAMYST) 27.5 MCG/SPRAY nasal spray Place 2 sprays into the nose daily as needed for rhinitis or allergies.     [provider]  ketoconazole (NIZORAL) 2 % cream apply to the affected topical area once daily 11/21/19   Colon Branch, MD  lamoTRIgine (LAMICTAL) 25 MG tablet Take 50 mg by mouth at bedtime.    [provider]  Lancets [name] ULTRASOFT) lancets Check blood sugars no more than twice daily 12/09/16   Colon Branch, MD  losartan (COZAAR) 25 MG tablet TAKE 1 TABLET BY MOUTH EVERY EVENING  01/21/20   Colon Branch, MD  Multiple Vitamin  (MULTIVITAMIN) tablet Take 1 tablet by mouth daily.     [provider]  [facility] VERIO test strip use to check blood sugar no more than twice daily 12/03/19   Colon Branch, MD  pioglitazone-metformin (ACTOPLUS MET) [phone] MG tablet TAKE ONE TABLET BY MOUTH TWICE DAILY WITH MEALS 11/20/19   Colon Branch, MD  simvastatin (ZOCOR) 40 MG tablet Take 1 tablet (40 mg total) by mouth at bedtime. 01/14/20   Colon Branch, MD  solifenacin (VESICARE) 10 MG tablet Take 10 mg by mouth daily. 10/11/19   [provider]  tamsulosin (FLOMAX) 0.4 MG CAPS  capsule Take 1 capsule (0.4 mg total) by mouth daily after supper. 07/30/19   Colon Branch, MD  traZODone (DESYREL) 50 MG tablet Take 50 mg by mouth at bedtime as needed for sleep.  07/09/14   Midge Minium, MD    Physical Exam: Vitals:   02/05/20 2225 02/05/20 2321 02/05/20 2345 02/06/20 0151  BP:   118/70 (!) 146/75  Pulse: 85  91 100  Resp: (!) 22  19 18   Temp:   98.1 F (36.7 C) 98.9 F (37.2 C)  TempSrc:   Oral Oral  SpO2: 97% 94% 95% 93%  Weight:      Height:        Physical Exam  Constitutional: He is oriented to person, place, and time. He appears well-developed and well-nourished. No distress.  HENT:  Head: Normocephalic.  Eyes: Right eye exhibits no discharge. Left eye exhibits no discharge.  Cardiovascular: Normal rate, regular rhythm and intact distal pulses.  Pulmonary/Chest: Effort normal. He has wheezes. He has no rales.  Abdominal: Soft. Bowel sounds are normal. He exhibits no distension. There is no abdominal tenderness. There is no guarding.  Musculoskeletal:     Cervical back: Neck supple.     Comments: Trace pedal edema  Neurological: He is alert and oriented to person, place, and time.  Skin: Skin is warm and dry. He is not diaphoretic.     Labs on Admission: I have personally reviewed following labs and imaging studies  CBC: Recent Labs  Lab 02/05/20 2131  WBC 6.9  NEUTROABS 4.6  HGB 11.4*  HCT 36.9*   MCV 86.4  PLT 458   Basic Metabolic Panel: Recent Labs  Lab 02/05/20 2131  NA 138  K 4.3  CL 105  CO2 25  GLUCOSE 148*  BUN 26*  CREATININE 1.14  CALCIUM 8.6*   GFR: Estimated Creatinine Clearance: 76.4 mL/min (by C-G formula based on SCr of 1.14 mg/dL). Liver Function Tests: No results for input(s): AST, ALT, ALKPHOS, BILITOT, PROT, ALBUMIN in the last 168 hours. No results for input(s): LIPASE, AMYLASE in the last 168 hours. No results for input(s): AMMONIA in the last 168 hours. Coagulation Profile: No results for input(s): INR, PROTIME in the last 168 hours. Cardiac Enzymes: No results for input(s): CKTOTAL, CKMB, CKMBINDEX, TROPONINI in the last 168 hours. BNP (last 3 results) No results for input(s): PROBNP in the last 8760 hours. HbA1C: No results for input(s): HGBA1C in the last 72 hours. CBG: No results for input(s): GLUCAP in the last 168 hours. Lipid Profile: No results for input(s): CHOL, HDL, LDLCALC, TRIG, CHOLHDL, LDLDIRECT in the last 72 hours. Thyroid Function Tests: No results for input(s): TSH, T4TOTAL, FREET4, T3FREE, THYROIDAB in the last 72 hours. Anemia Panel: No results for input(s): VITAMINB12, FOLATE, FERRITIN, TIBC, IRON, RETICCTPCT in the last 72 hours. Urine analysis:    Component Value Date/Time   COLORURINE dark Red (A) 07/02/2019 1611   APPEARANCEUR Cloudy (A) 07/02/2019 1611   LABSPEC 1.025 07/02/2019 1611   PHURINE 6.5 07/02/2019 1611   GLUCOSEU NEGATIVE 07/02/2019 1611   HGBUR LARGE (A) 07/02/2019 1611   BILIRUBINUR Moderate (2+) 07/02/2019 1613   BILIRUBINUR MODERATE (A) 07/02/2019 1611   KETONESUR 15 (A) 07/02/2019 1611   PROTEINUR Positive (A) 07/02/2019 1613   PROTEINUR NEGATIVE 06/02/2010 0817   UROBILINOGEN 2.0 (A) 07/02/2019 1613   UROBILINOGEN 4.0 (A) 07/02/2019 1611   NITRITE Positive 07/02/2019 1613   NITRITE POSITIVE (A) 07/02/2019 1611   LEUKOCYTESUR Large (3+) (  A) 07/02/2019 1613   LEUKOCYTESUR MODERATE (A)  07/02/2019 1611    Radiological Exams on Admission: DG Chest Portable 1 View  Result Date: 02/05/2020 CLINICAL DATA:  Wheezing, cough EXAM: PORTABLE CHEST 1 VIEW COMPARISON:  11/07/2019 FINDINGS: Cardiomegaly. No confluent airspace opacities or effusions. Areas of scarring in the mid and lower lungs. No acute bony abnormality. IMPRESSION: No acute cardiopulmonary disease. Electronically Signed   By: Rolm Baptise M.D.   On: 02/05/2020 21:26    EKG: Independently reviewed.  Sinus rhythm, incomplete RBBB, nonspecific T wave abnormalities.  No significant change since prior tracing.  Assessment/Plan Principal Problem:   Asthma exacerbation Active Problems:   Essential hypertension, benign   Type 2 diabetes mellitus ([place])   Acute asthma exacerbation: Work of breathing improved after receiving treatments in the ED but continues to have wheezing.  Not hypoxic.  Give Solu-Medrol 60 mg every 6 hours, DuoNebs every 6 hours, albuterol nebulizer as needed, and Pulmicort twice daily.  Continuous pulse ox, supplemental oxygen as needed.  Non-insulin-dependent diabetes: Check A1c.  Sliding scale insulin sensitive ACHS insulin checks.  Hypertension: Stable  Pharmacy med rec pending.  DVT prophylaxis: Lovenox Code Status: Patient wishes to be full code. Family Communication: No family available at this time. Disposition Plan: Anticipate discharge in 1 to 2 days. Consults called: None Admission status: It is my clinical opinion that referral for OBSERVATION is reasonable and necessary in this patient based on the above information provided. The aforementioned taken together are felt to place the patient at high risk for further clinical deterioration. However it is anticipated that the patient may be medically stable for discharge from the hospital within 24 to 48 hours.  The medical decision making on this patient was of high complexity and the patient is at high risk for clinical deterioration,  therefore this is a level 3 visit.  Shela Leff MD [facility]  If 7PM-7AM, please contact night-coverage [email or web address]  02/06/2020, 2:43 AM

## 2020-02-06 NOTE — Evaluation (Signed)
Occupational Therapy Evaluation Patient Details Name: Ryan Zhang MRN: [id number] DOB: 10/21/1946 Today's Date: 02/06/2020    History of Present Illness 74 yo male admitted with asthma exacerbation. Hx of bipolar d/o, CVA, Hep A, obesity, DJD, DM, COPD, Meniere's disease   Clinical Impression   PTA, pt was living at home with his wife and adult children, pt reports he was independent with ADL/IADL and functional mobility with intermittent use of spc. Pt reports several falls in the past year and reports about 1 per month, reports he is able to get up by himself. Pt has hx of Meniere's disease causing him to loose his balance if he moves too quickly. Educated pt on fall prevention strategies.  Pt currently functioning close to baseline, requiring supervision for functional mobility with ADL completion at sink level. Due to decline in current level of function, pt would benefit from acute OT to address established goals to facilitate safe D/C to venue listed below. No follow-up OT recommended after d/c.     Follow Up Recommendations  No OT follow up;Supervision - Intermittent    Equipment Recommendations  None recommended by OT    Recommendations for Other Services       Precautions / Restrictions Precautions Precautions: Fall Restrictions Weight Bearing Restrictions: No      Mobility Bed Mobility Overal bed mobility: Independent                Transfers Overall transfer level: Needs assistance Equipment used: None Transfers: Sit to/from Stand Sit to Stand: Supervision         General transfer comment: supervision for safety;no dizziness reported this date    Balance Overall balance assessment: Mild deficits observed, not formally tested                                         ADL either performed or assessed with clinical judgement   ADL Overall ADL's : At baseline                                       General ADL  Comments: pt close to his baseline but reports more fatigue and increased SOB with activity;educated pt on activity progression, energy conservation strategies with provided handout;pt able to figure-4 bLE to don/doff LB dressing;he completed grooming at sink level with supervision for safety, no LOB noted with in room mobility;supervision for functional mobiltiy;educated pt on fall prevention strategies with provided handout  and moving slowly when mobilizing, also educated pt on use of reacher and bringing feet to him when dressing to decrease dizziness     Vision Baseline Vision/History: Cataracts;Wears glasses Wears Glasses: At all times Patient Visual Report: No change from baseline       Perception     Praxis      Pertinent Vitals/Pain Pain Assessment: No/denies pain     Hand Dominance Right   Extremity/Trunk Assessment Upper Extremity Assessment Upper Extremity Assessment: Defer to OT evaluation   Lower Extremity Assessment Lower Extremity Assessment: Generalized weakness   Cervical / Trunk Assessment Cervical / Trunk Assessment: Normal   Communication Communication Communication: No difficulties   Cognition Arousal/Alertness: Awake/alert Behavior During Therapy: WFL for tasks assessed/performed Overall Cognitive Status: Within Functional Limits for tasks assessed  General Comments: pt asking very good questions on increasing safety in his home and fall prevention strategies   General Comments  SpO2 92-98% RA throughout session and with activity;HR 90-108bpm     Exercises     Shoulder Instructions      Home Living Family/patient expects to be discharged to:: Private residence Living Arrangements: Spouse/significant other;Children;Non-relatives/Friends;Other relatives Available Help at Discharge: Family;Available 24 hours/day Type of Home: House Home Access: Stairs to enter           [employer] Shower/Tub:  Producer, television/film/video: Standard     Home Equipment: Environmental consultant - 2 wheels;Cane - single point          Prior Functioning/Environment Level of Independence: Independent        Comments: prior to COVID pt was going to the gym and using a bike and machines;pt reports hx of falls about 1 per month, he is able to get himself off the ground;he uses cane prn        OT Problem List: Decreased activity tolerance;Impaired balance (sitting and/or standing);Cardiopulmonary status limiting activity      OT Treatment/Interventions: Self-care/ADL training;Therapeutic exercise;Energy conservation;DME and/or AE instruction;Therapeutic activities;Patient/family education;Balance training    OT Goals(Current goals can be found in the care plan section) Acute Rehab OT Goals Patient Stated Goal: to be able to get back to the gym OT Goal Formulation: With patient Time For Goal Achievement: 02/20/20 Potential to Achieve Goals: Good ADL Goals Pt Will Perform Lower Body Dressing: Independently;sit to/from stand Pt Will Transfer to Toilet: Independently;ambulating Additional ADL Goal #1: Pt will demonstrate independence with 3 fall prevention strategies during ADL/IADL and functional mobility. Additional ADL Goal #2: Pt will demonstrate independence with 3 energy conservation strategies during ADL/IADL and functional mobility.  OT Frequency: Min 2X/week   Barriers to D/C:            Co-evaluation              AM-PAC OT "6 Clicks" Daily Activity     Outcome Measure Help from another person eating meals?: None Help from another person taking care of personal grooming?: A Little Help from another person toileting, which includes using toliet, bedpan, or urinal?: A Little Help from another person bathing (including washing, rinsing, drying)?: A Little Help from another person to put on and taking off regular upper body clothing?: A Little Help from another person to put on and taking  off regular lower body clothing?: A Little 6 Click Score: 19   End of Session Nurse Communication: Mobility status  Activity Tolerance: Patient tolerated treatment well Patient left: in chair;with call bell/phone within reach  OT Visit Diagnosis: Other abnormalities of gait and mobility (R26.89);History of falling (Z91.81)                Time: 0623-7628 OT Time Calculation (min): 32 min Charges:  OT General Charges $OT Visit: 1 Visit OT Evaluation $OT Eval Moderate Complexity: 1 Mod OT Treatments $Self Care/Home Management : 8-22 mins  Rosey Bath OTR/L Acute Rehabilitation Services Office: [phone]   Rebeca Alert 02/06/2020, 11:06 AM

## 2020-02-06 NOTE — Progress Notes (Signed)
  Pt is being discharged home today. Discharge instructions including medications and follow up appointments given. Pt had no further questions at this time. 

## 2020-02-06 NOTE — Evaluation (Signed)
Physical Therapy Evaluation-1x Patient Details Name: Ryan Zhang MRN: [id number] DOB: 10-22-1946 Today's Date: 02/06/2020   History of Present Illness  74 yo male admitted with asthma exacerbation. Hx of bipolar d/o, CVA, Hep A, obesity, DJD, DM, COPD, Meniere's disease  Clinical Impression  On eval, pt was Supv level for mobility. He walked ~250 feet around the unit without an AD. Pt tolerated activity well. O2 95% on RA during session. Do not anticipate any f/u PT needs. 1x eval. Recommend cane use PRN (pt reported he sometimes uses one).     Follow Up Recommendations No PT follow up    Equipment Recommendations  None recommended by PT    Recommendations for Other Services       Precautions / Restrictions Precautions Precautions: Fall Restrictions Weight Bearing Restrictions: No      Mobility  Bed Mobility Overal bed mobility: Independent                Transfers Overall transfer level: Modified independent Equipment used: None Transfers: Sit to/from Stand Sit to Stand: Modified independent (Device/Increase time)         General transfer comment: supervision for safety;no dizziness reported this date  Ambulation/Gait Ambulation/Gait assistance: Supervision Gait Distance (Feet): 250 Feet Assistive device: None Gait Pattern/deviations: Step-through pattern;Decreased stride length     General Gait Details: mildly unsteady at times but no overt LOB. O2 95% on RA  Stairs            Wheelchair Mobility    Modified Rankin (Stroke Patients Only)       Balance Overall balance assessment: Mild deficits observed, not formally tested                                           Pertinent Vitals/Pain Pain Assessment: No/denies pain    Home Living Family/patient expects to be discharged to:: Private residence Living Arrangements: Spouse/significant other;Children;Non-relatives/Friends;Other relatives Available Help at Discharge:  Family;Available 24 hours/day Type of Home: House Home Access: Stairs to enter       Home Equipment: Walker - 2 wheels;Cane - single point      Prior Function Level of Independence: Independent         Comments: prior to COVID pt was going to the gym and using a bike and machines;pt reports hx of falls about 1 per month, he is able to get himself off the ground;he uses cane prn     Hand Dominance   Dominant Hand: Right    Extremity/Trunk Assessment   Upper Extremity Assessment Upper Extremity Assessment: Defer to OT evaluation    Lower Extremity Assessment Lower Extremity Assessment: Generalized weakness    Cervical / Trunk Assessment Cervical / Trunk Assessment: Normal  Communication   Communication: No difficulties  Cognition Arousal/Alertness: Awake/alert Behavior During Therapy: WFL for tasks assessed/performed Overall Cognitive Status: Within Functional Limits for tasks assessed                                 General Comments: pt asking very good questions on increasing safety in his home and fall prevention strategies      General Comments General comments (skin integrity, edema, etc.): SpO2 92-98% RA throughout session and with activity;HR 90-108bpm     Exercises     Assessment/Plan    PT Assessment Patent  does not need any further PT services  PT Problem List         PT Treatment Interventions      PT Goals (Current goals can be found in the Care Plan section)  Acute Rehab PT Goals Patient Stated Goal: to be able to get back to the gym PT Goal Formulation: All assessment and education complete, DC therapy    Frequency     Barriers to discharge        Co-evaluation               AM-PAC PT "6 Clicks" Mobility  Outcome Measure Help needed turning from your back to your side while in a flat bed without using bedrails?: None Help needed moving from lying on your back to sitting on the side of a flat bed without using  bedrails?: None Help needed moving to and from a bed to a chair (including a wheelchair)?: None Help needed standing up from a chair using your arms (e.g., wheelchair or bedside chair)?: None Help needed to walk in hospital room?: A Little Help needed climbing 3-5 steps with a railing? : A Little 6 Click Score: 22    End of Session Equipment Utilized During Treatment: Gait belt Activity Tolerance: Patient tolerated treatment well Patient left: in bed;with call bell/phone within reach;with bed alarm set        Time: 3710-6269 PT Time Calculation (min) (ACUTE ONLY): 9 min   Charges:   PT Evaluation $PT Eval Low Complexity: 1 Low            Tyeisha Dinan P, PT Acute Rehabilitation

## 2020-02-06 NOTE — Progress Notes (Signed)
74 year old male history of asthma admitted [facility] this morning with asthma exacerbation.  He has history of type 2 diabetes hypertension stroke and obesity.  He is also has bipolar disorder.  Covid PCR is pending.  Continue IV steroids and taper as tolerated.

## 2020-02-07 ENCOUNTER — Telehealth: Payer: Self-pay | Admitting: *Deleted

## 2020-02-07 NOTE — Telephone Encounter (Signed)
1st attempt. Unable to reach patient.   

## 2020-02-08 ENCOUNTER — Other Ambulatory Visit: Payer: Self-pay

## 2020-02-08 NOTE — Telephone Encounter (Signed)
I have made two attempts and have been unable to reach patient. Pt has hospital follow up scheduled w/ PCP 02/11/20 @1120 .

## 2020-02-09 ENCOUNTER — Other Ambulatory Visit: Payer: Self-pay | Admitting: Internal Medicine

## 2020-02-11 ENCOUNTER — Other Ambulatory Visit: Payer: Self-pay

## 2020-02-11 ENCOUNTER — Ambulatory Visit (INDEPENDENT_AMBULATORY_CARE_PROVIDER_SITE_OTHER): Payer: Medicare Other | Admitting: Internal Medicine

## 2020-02-11 ENCOUNTER — Encounter: Payer: Self-pay | Admitting: Internal Medicine

## 2020-02-11 VITALS — BP 136/82 | HR 73 | Temp 96.0°F | Resp 18 | Ht 69.0 in | Wt 257.5 lb

## 2020-02-11 DIAGNOSIS — J3089 Other allergic rhinitis: Secondary | ICD-10-CM | POA: Diagnosis not present

## 2020-02-11 DIAGNOSIS — I10 Essential (primary) hypertension: Secondary | ICD-10-CM

## 2020-02-11 DIAGNOSIS — J300 Vasomotor rhinitis: Secondary | ICD-10-CM | POA: Diagnosis not present

## 2020-02-11 DIAGNOSIS — E1142 Type 2 diabetes mellitus with diabetic polyneuropathy: Secondary | ICD-10-CM

## 2020-02-11 DIAGNOSIS — J4540 Moderate persistent asthma, uncomplicated: Secondary | ICD-10-CM | POA: Diagnosis not present

## 2020-02-11 DIAGNOSIS — J45901 Unspecified asthma with (acute) exacerbation: Secondary | ICD-10-CM

## 2020-02-11 LAB — BASIC METABOLIC PANEL
BUN: 23 mg/dL (ref 6–23)
CO2: 29 mEq/L (ref 19–32)
Calcium: 8.9 mg/dL (ref 8.4–10.5)
Chloride: 101 mEq/L (ref 96–112)
Creatinine, Ser: 1.05 mg/dL (ref 0.40–1.50)
GFR: 69.49 mL/min (ref >60.00)
Glucose, Bld: 215 mg/dL — ABNORMAL HIGH (ref 70–99)
Potassium: 4.4 mEq/L (ref 3.5–5.1)
Sodium: 138 mEq/L (ref 135–145)

## 2020-02-11 MED ORDER — ALBUTEROL SULFATE HFA 108 (90 BASE) MCG/ACT IN AERS: 2.0000 | INHALATION_SPRAY | Freq: Four times a day (QID) | RESPIRATORY_TRACT | 6 refills | Status: AC | PRN

## 2020-02-11 NOTE — Patient Instructions (Signed)
Continue checking your blood sugars. I anticipate they will go back to where they used to be in the next couple of weeks, if that is not the case let me know  Please check your blood pressures at home once or twice a week BP GOAL is between 110/65 and  135/85. If it is consistently higher or lower, let me know  Your next appointment is set up for June, call sooner if needed   GO TO THE LAB : Get the blood work

## 2020-02-11 NOTE — Telephone Encounter (Signed)
Appt later today.
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
## 2020-02-11 NOTE — Progress Notes (Signed)
Pre visit review using our clinic review tool, if applicable. No additional management support is needed unless otherwise documented below in the visit note. 

## 2020-02-12 NOTE — Assessment & Plan Note (Signed)
Asthma exacerbation: Status post admission to the hospital, feeling much better, no O2 sats at home but here 99%. Still taking prednisone.   On Symbicort twice a day, has not needed albuterol nebs since Ryan Zhang left the hospital. Request a refill on albuterol inhaler: Sent. Call if symptoms resurface. DM: Good compliance with Actos plus met, CBGs higher than usual, around 230.  Anticipate CBGs will decrease as Ryan Zhang tapered down prednisone, if that is not the case Ryan Zhang will let me know HTN: BP upon arrival is slightly elevated, recheck: 136/82, check a BMP, continue losartan. RTC as a scheduled 05/09/2020.

## 2020-02-13 ENCOUNTER — Other Ambulatory Visit: Payer: Self-pay | Admitting: Internal Medicine

## 2020-02-13 ENCOUNTER — Other Ambulatory Visit: Payer: Self-pay

## 2020-02-13 MED ORDER — TAMSULOSIN HCL 0.4 MG PO CAPS: 0.4000 mg | ORAL_CAPSULE | Freq: Every day | ORAL | 3 refills | Status: DC

## 2020-02-16 IMAGING — DX DG CHEST 2V
2 series · 2 of 2 positions shown · non-contrast
Comparison: 04/25/2019

CLINICAL DATA: Cough

EXAM:
CHEST - 2 VIEW

[chest pa]
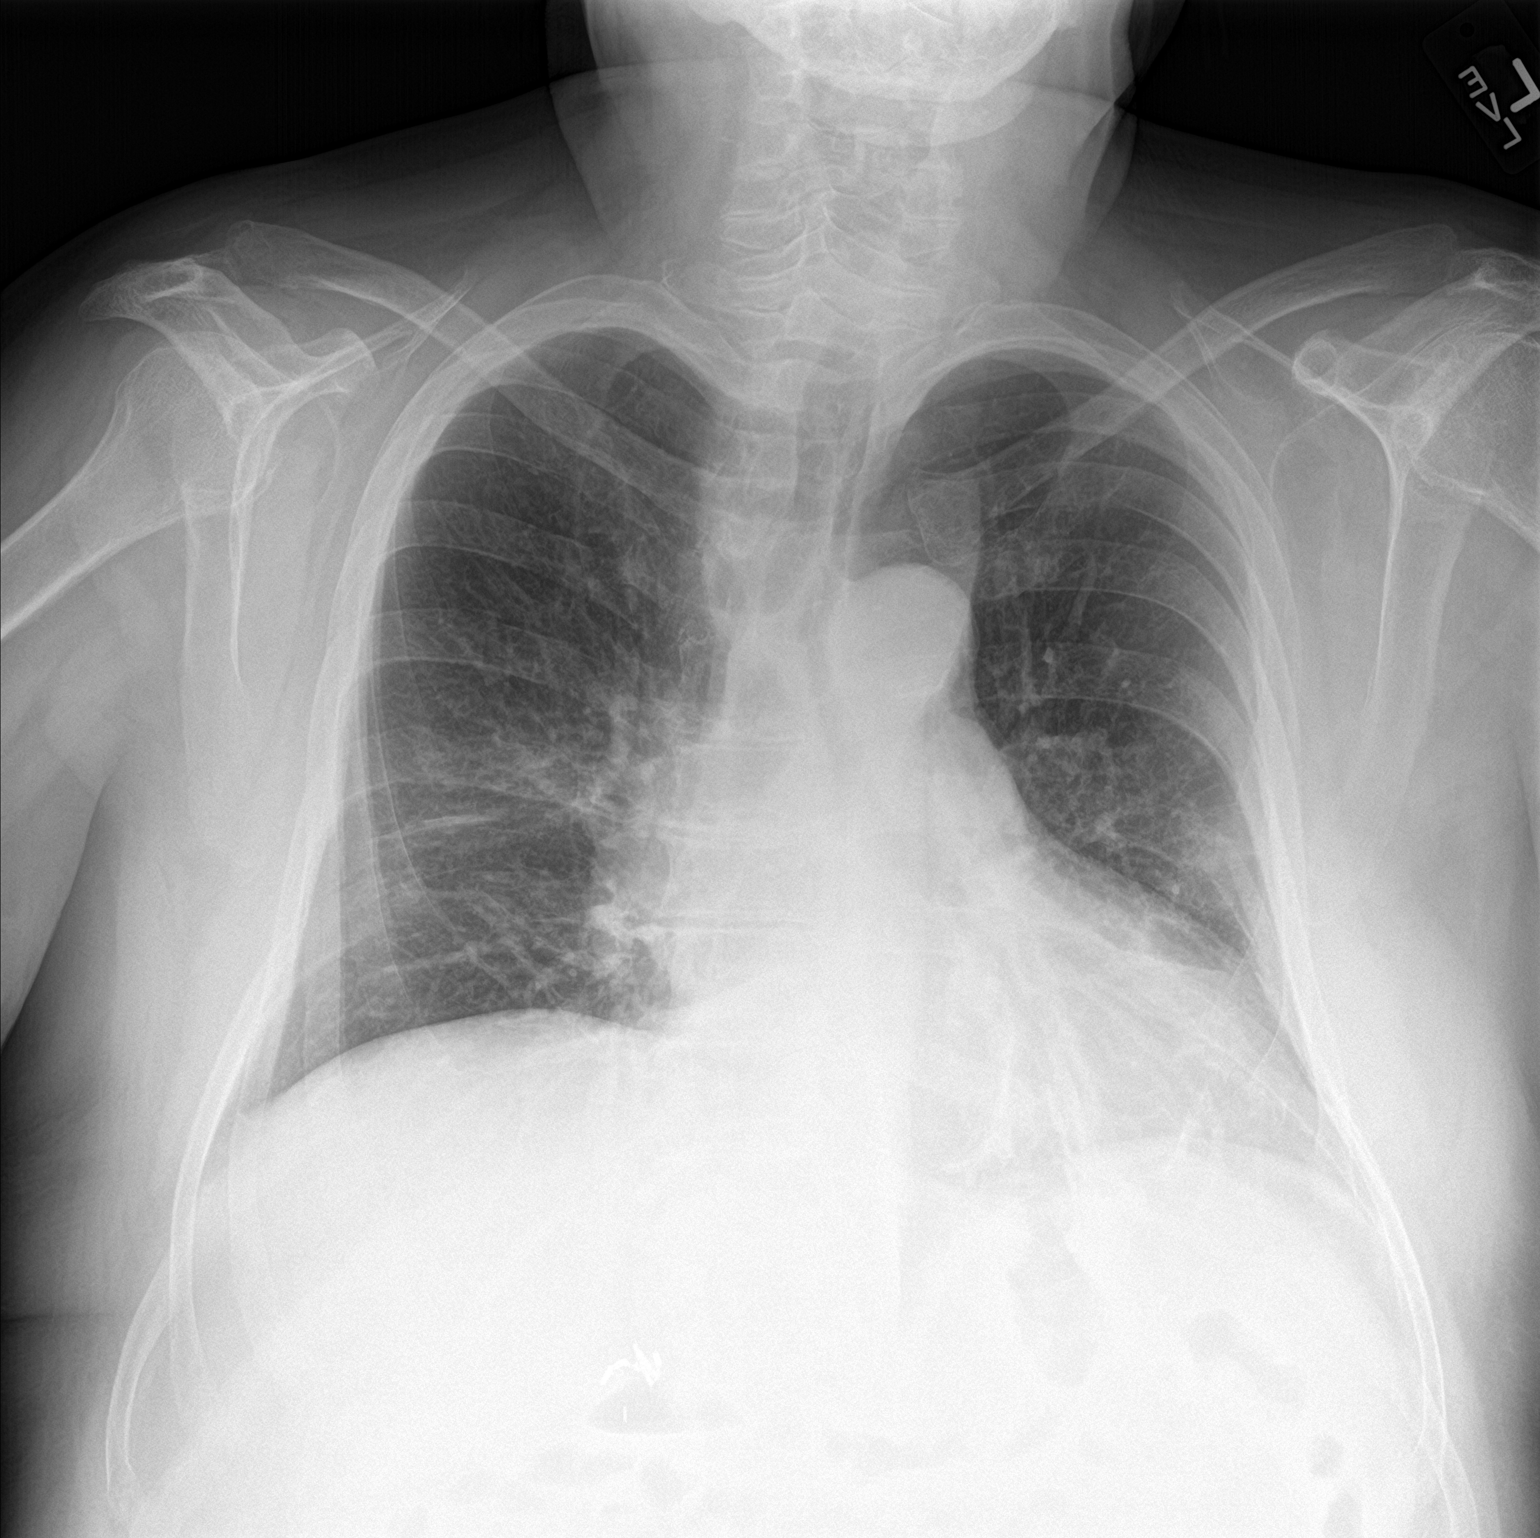

[chest lat]
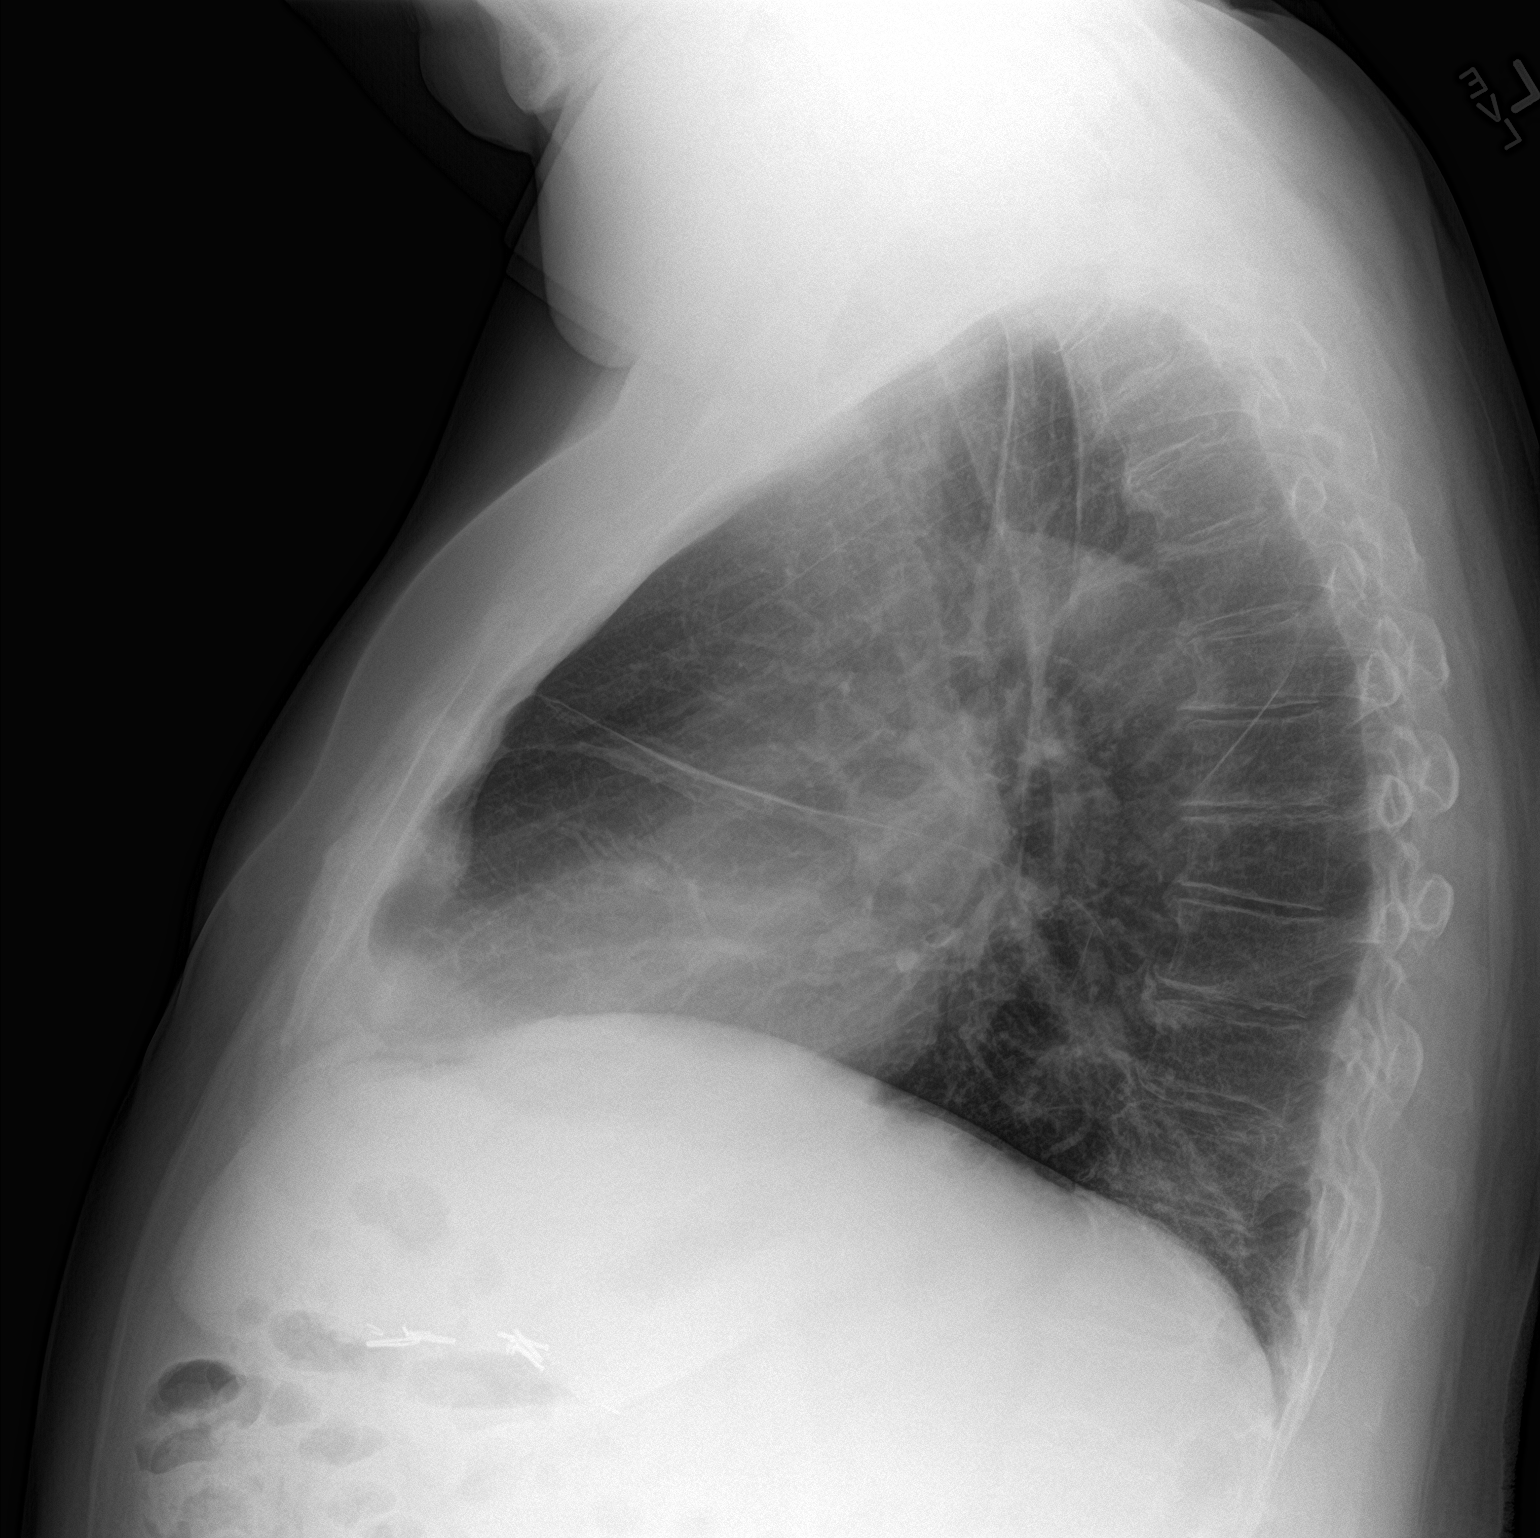

[2 of 2 positions shown; findings below may reference images not displayed]

FINDINGS: Heart is normal size. Concern for early patchy bilateral airspace
opacities in the mid lungs. No effusions. No acute bony abnormality.
IMPRESSION: Concern for early patchy bilateral mid lung airspace opacities.
Appearance is concerning for early pneumonia.

## 2020-02-20 DIAGNOSIS — J3089 Other allergic rhinitis: Secondary | ICD-10-CM | POA: Diagnosis not present

## 2020-02-20 DIAGNOSIS — J301 Allergic rhinitis due to pollen: Secondary | ICD-10-CM | POA: Diagnosis not present

## 2020-02-22 DIAGNOSIS — J301 Allergic rhinitis due to pollen: Secondary | ICD-10-CM | POA: Diagnosis not present

## 2020-02-22 DIAGNOSIS — J3089 Other allergic rhinitis: Secondary | ICD-10-CM | POA: Diagnosis not present

## 2020-02-26 ENCOUNTER — Ambulatory Visit: Payer: Medicare Other | Admitting: Pharmacist

## 2020-02-26 ENCOUNTER — Other Ambulatory Visit: Payer: Self-pay

## 2020-02-26 DIAGNOSIS — E785 Hyperlipidemia, unspecified: Secondary | ICD-10-CM

## 2020-02-26 DIAGNOSIS — J45909 Unspecified asthma, uncomplicated: Secondary | ICD-10-CM

## 2020-02-26 DIAGNOSIS — E1169 Type 2 diabetes mellitus with other specified complication: Secondary | ICD-10-CM

## 2020-02-26 DIAGNOSIS — I10 Essential (primary) hypertension: Secondary | ICD-10-CM

## 2020-02-26 DIAGNOSIS — E1142 Type 2 diabetes mellitus with diabetic polyneuropathy: Secondary | ICD-10-CM
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
## 2020-02-27 DIAGNOSIS — J3089 Other allergic rhinitis: Secondary | ICD-10-CM | POA: Diagnosis not present

## 2020-02-27 DIAGNOSIS — J301 Allergic rhinitis due to pollen: Secondary | ICD-10-CM | POA: Diagnosis not present

## 2020-03-04 ENCOUNTER — Other Ambulatory Visit: Payer: Self-pay

## 2020-03-04 DIAGNOSIS — I10 Essential (primary) hypertension: Secondary | ICD-10-CM

## 2020-03-04 DIAGNOSIS — E1169 Type 2 diabetes mellitus with other specified complication: Secondary | ICD-10-CM

## 2020-03-04 DIAGNOSIS — F3131 Bipolar disorder, current episode depressed, mild: Secondary | ICD-10-CM

## 2020-03-05 DIAGNOSIS — J301 Allergic rhinitis due to pollen: Secondary | ICD-10-CM | POA: Diagnosis not present

## 2020-03-05 DIAGNOSIS — J3089 Other allergic rhinitis: Secondary | ICD-10-CM | POA: Diagnosis not present

## 2020-03-12 DIAGNOSIS — J3089 Other allergic rhinitis: Secondary | ICD-10-CM | POA: Diagnosis not present

## 2020-03-12 DIAGNOSIS — J301 Allergic rhinitis due to pollen: Secondary | ICD-10-CM | POA: Diagnosis not present

## 2020-03-13 NOTE — Patient Instructions (Signed)
Visit Information  Goals Addressed            This Visit's Progress   . Blood pressure goal less than 140/90      . Blood sugar goal less than 7%      . Check blood pressure 2-3 times per week and record readings      . Check blood sugar when feeling dizzy to rule out low blood sugar as a cause      . Discuss possibility of Trelegy with Dr. [name]      . LDL goal less than 70      . Pharmacy Care Plan       CARE PLAN ENTRY  Current Barriers:  . Chronic Disease Management support, education, and care coordination needs related to Asthma, Diabetes, Hypertension, Hyperlipidemia, Bipolar Disorder, GERD, Allergic Rhinitis, Overactive Bladder, BPH, Insomnia  Pharmacist Clinical Goal(s):  [name] Asthma o Over the next 90 days patient will use inhalers as prescribed. o Over the next 90 days patient will have minimal to no asthma exacerbations . Hypertension o Over the next 90 days patient's blood pressure will be less than 140/90 . Diabetes o Over the next 90 days patient's a1c will be less than 7% . Hyperlipidemia o Over the next 90 days patient's LDL will be less than 70  Interventions: . Comprehensive medication review performed. . Asthma o Discussed the possibility of Trelegy and it's role to help control the patient's asthma. Will consider this titration if the patient continues to have difficulty . Hypertension o Check blood pressure 2-3 times per week and record readings  . Diabetes o Check blood sugar when feeling dizzy to assure low blood sugar is not the cause of dizziness Patient Self Care Activities:  . Patient verbalizes understanding of plan to follow as described above, Self administers medications as prescribed, Calls pharmacy for medication refills, and Calls provider office for new concerns or questions . Asthma o Over the next 90 days patient will take inhalers as prescribed.  . Hypertension o Over the next 90 days patient will check blood pressure 2-3 times per  week and record readings  . Diabetes o Over the next 90 days patient will check blood sugar when feeling dizzy to assure low blood sugar is not the cause of dizziness  Initial goal documentation        Ryan Zhang was given information about Chronic Care Management services today including:  1. CCM service includes personalized support from designated clinical staff supervised by his physician, including individualized plan of care and coordination with other care providers 2. 24/7 contact phone numbers for assistance for urgent and routine care needs. 3. Standard insurance, coinsurance, copays and deductibles apply for chronic care management only during months in which we provide at least 20 minutes of these services. Most insurances cover these services at 100%, however patients may be responsible for any copay, coinsurance and/or deductible if applicable. This service may help you avoid the need for more expensive face-to-face services. 4. Only one practitioner may furnish and bill the service in a calendar month. 5. The patient may stop CCM services at any time (effective at the end of the month) by phone call to the office staff.  Patient agreed to services and verbal consent obtained.   The patient verbalized understanding of instructions provided today and agreed to receive a mailed copy of patient instruction and/or educational materials. Telephone follow up appointment with pharmacy team member scheduled for: 03/27/2020  Dannielle Karvonen  Aliani Caccavale, PharmD Clinical Pharmacist [facility] [phone]    How to Take Your Blood Pressure You can take your blood pressure at home with a machine. You may need to check your blood pressure at home:  To check if you have high blood pressure (hypertension).  To check your blood pressure over time.  To make sure your blood pressure medicine is working. Supplies needed: You will need a blood pressure machine, or  monitor. You can buy one at a drugstore or online. When choosing one:  Choose one with an arm cuff.  Choose one that wraps around your upper arm. Only one finger should fit between your arm and the cuff.  Do not choose one that measures your blood pressure from your wrist or finger. Your doctor can suggest a monitor. How to prepare Avoid these things for 30 minutes before checking your blood pressure:  Drinking caffeine.  Drinking alcohol.  Eating.  Smoking.  Exercising. Five minutes before checking your blood pressure:  Pee.  Sit in a dining chair. Avoid sitting in a soft couch or armchair.  Be quiet. Do not talk. How to take your blood pressure Follow the instructions that came with your machine. If you have a digital blood pressure monitor, these may be the instructions: 1. Sit up straight. 2. Place your feet on the floor. Do not cross your ankles or legs. 3. Rest your left arm at the level of your heart. You may rest it on a table, desk, or chair. 4. Pull up your shirt sleeve. 5. Wrap the blood pressure cuff around the upper part of your left arm. The cuff should be 1 inch (2.5 cm) above your elbow. It is best to wrap the cuff around bare skin. 6. Fit the cuff snugly around your arm. You should be able to place only one finger between the cuff and your arm. 7. Put the cord inside the groove of your elbow. 8. Press the power button. 9. Sit quietly while the cuff fills with air and loses air. 10. Write down the numbers on the screen. 11. Wait 2-3 minutes and then repeat steps 1-10. What do the numbers mean? Two numbers make up your blood pressure. The first number is called systolic pressure. The second is called diastolic pressure. An example of a blood pressure reading is "120 over 80" (or 120/80). If you are an adult and do not have a medical condition, use this guide to find out if your blood pressure is normal: Normal  First number: below 120.  Second number:  below 80. Elevated  First number: 120-129.  Second number: below 80. Hypertension stage 1  First number: 130-139.  Second number: 80-89. Hypertension stage 2  First number: 140 or above.  Second number: 90 or above. Your blood pressure is above normal even if only the top or bottom number is above normal. Follow these instructions at home:  Check your blood pressure as often as your doctor tells you to.  Take your monitor to your next doctor's appointment. Your doctor will: ? Make sure you are using it correctly. ? Make sure it is working right.  Make sure you understand what your blood pressure numbers should be.  Tell your doctor if your medicines are causing side effects. Contact a doctor if:  Your blood pressure keeps being high. Get help right away if:  Your first blood pressure number is higher than 180.  Your second blood pressure number is higher  than 120. This information is not intended to replace advice given to you by your health care provider. Make sure you discuss any questions you have with your health care provider. Document Revised: 10/14/2017 Document Reviewed: 04/09/2016 Elsevier Patient Education  2020 [employer].  Blood Pressure Record Sheet To take your blood pressure, you will need a blood pressure machine. You can buy a blood pressure machine (blood pressure monitor) at your clinic, drug store, or online. When choosing one, consider:  An automatic monitor that has an arm cuff.  A cuff that wraps snugly around your upper arm. You should be able to fit only one finger between your arm and the cuff.  A device that stores blood pressure reading results.  Do not choose a monitor that measures your blood pressure from your wrist or finger. Follow your health care provider's instructions for how to take your blood pressure. To use this form:  Get one reading in the morning (a.m.) before you take any medicines.  Get one reading in the evening  (p.m.) before supper.  Take at least 2 readings with each blood pressure check. This makes sure the results are correct. Wait 1-2 minutes between measurements.  Write down the results in the spaces on this form.  Repeat this once a week, or as told by your health care provider.  Make a follow-up appointment with your health care provider to discuss the results. Blood pressure log Date: _______________________  a.m. _____________________(1st reading) _____________________(2nd reading)  p.m. _____________________(1st reading) _____________________(2nd reading) Date: _______________________  a.m. _____________________(1st reading) _____________________(2nd reading)  p.m. _____________________(1st reading) _____________________(2nd reading) Date: _______________________  a.m. _____________________(1st reading) _____________________(2nd reading)  p.m. _____________________(1st reading) _____________________(2nd reading) Date: _______________________  a.m. _____________________(1st reading) _____________________(2nd reading)  p.m. _____________________(1st reading) _____________________(2nd reading) Date: _______________________  a.m. _____________________(1st reading) _____________________(2nd reading)  p.m. _____________________(1st reading) _____________________(2nd reading) This information is not intended to replace advice given to you by your health care provider. Make sure you discuss any questions you have with your health care provider. Document Revised: 12/30/2017 Document Reviewed: 11/01/2017 Elsevier Patient Education  [employer].

## 2020-03-19 DIAGNOSIS — J3089 Other allergic rhinitis: Secondary | ICD-10-CM | POA: Diagnosis not present

## 2020-03-19 DIAGNOSIS — J301 Allergic rhinitis due to pollen: Secondary | ICD-10-CM | POA: Diagnosis not present

## 2020-03-26 DIAGNOSIS — J301 Allergic rhinitis due to pollen: Secondary | ICD-10-CM | POA: Diagnosis not present

## 2020-03-26 DIAGNOSIS — J3089 Other allergic rhinitis: Secondary | ICD-10-CM | POA: Diagnosis not present

## 2020-03-27 ENCOUNTER — Telehealth: Payer: Medicare Other

## 2020-04-02 DIAGNOSIS — J3089 Other allergic rhinitis: Secondary | ICD-10-CM | POA: Diagnosis not present

## 2020-04-02 DIAGNOSIS — J301 Allergic rhinitis due to pollen: Secondary | ICD-10-CM | POA: Diagnosis not present

## 2020-04-09 DIAGNOSIS — J301 Allergic rhinitis due to pollen: Secondary | ICD-10-CM | POA: Diagnosis not present

## 2020-04-09 DIAGNOSIS — J3089 Other allergic rhinitis: Secondary | ICD-10-CM | POA: Diagnosis not present

## 2020-04-10 NOTE — Progress Notes (Signed)
I connected with [place] today by telephone and verified that I am speaking with the correct person using two identifiers. Location patient: home Location provider: work Persons participating in the virtual visit: patient, RN   I discussed the limitations, risks, security and privacy concerns of performing an evaluation and management service by telephone and the availability of in person appointments. I also discussed with the patient that there may be a patient responsible charge related to this service. The patient expressed understanding and verbally consented to this telephonic visit.    Interactive audio and video telecommunications were attempted between RN and patient, however failed, due to patient having technical difficulties OR patient did not have access to video capability.  We continued and completed visit with audio only.  Some vital signs may be absent or patient reported.    Subjective:   Ryan Zhang is a 74 y.o. male who presents for Medicare Annual/Subsequent preventive examination.  Review of Systems:  Home Safety/Smoke Alarms: Feels safe in home. Smoke alarms in place.  Lives w/ wife and dtr in 2 story home. Master on 2nd floor. Plans to soon sell home and move to 1st floor apt.   Male:   CCS- 10/06/18.     PSA-  Lab Results  Component Value Date   PSA 0.96 07/02/2019   PSA 0.58 08/28/2018   PSA 1.00 05/09/2017       Objective:    Vitals: Unable to assess. This visit is enabled though telemedicine due to Covid 19.   Advanced Directives 04/11/2020 02/06/2020 02/05/2020 02/13/2019 07/18/2018 07/18/2018 07/22/2017  Does Patient Have a Medical Advance Directive? Yes Yes Yes Yes No No Yes  Type of Paramedic of [place];Living will [place];Living will [place];Living will [place];Living will - - -  Does patient want to make changes to medical advance directive? No - Patient declined  No - Patient declined - No - Patient declined - - -  Copy of [place] in Chart? Yes - validated most recent copy scanned in chart (See row information) - - Yes - validated most recent copy scanned in chart (See row information) - - -  Would patient like information on creating a medical advance directive? - - - - No - Patient declined - -    Tobacco Social History   Tobacco Use  Smoking Status Never Smoker  Smokeless Tobacco Never Used     Counseling given: Not Answered   Clinical Intake: Pain : No/denies pain     Past Medical History:  Diagnosis Date  . Allergy    allergy shots Dr. Velora Heckler  . Anemia   . Anxiety   . Asthma    moderate persistant  . Bipolar affective ([place])   . Cataract    both eyes  . COPD (chronic obstructive pulmonary disease) ([place])   . Depression   . Diabetes mellitus   . DJD (degenerative joint disease)   . Dysphagia   . Gallstones    hx of, s/p cholecystectomy  . GERD (gastroesophageal reflux disease)   . Hepatitis A    as teenager 25's  . Hollenhorst plaque    right eye  . Hyperlipidemia   . Hypertension   . Kidney stones    hx of  . Meniere's disease   . Motility disorder, esophageal   . Obesity   . Pancreatitis   . Personal history of colonic polyps 11/2004   hyperplastic.  [name]  Stroke [facility])    per MRI   Past Surgical History:  Procedure Laterality Date  . CATARACT EXTRACTION Bilateral 09/2015 and 10/2015  . CHOLECYSTECTOMY    . COLONOSCOPY    . TOTAL KNEE ARTHROPLASTY Bilateral    both knees   Family History  Problem Relation Age of Onset  . Diabetes Father   . Heart disease Brother   . Heart disease Maternal Grandmother   . Cancer Sister        unknown type  . Colon cancer Neg Hx   . Prostate cancer Neg Hx   . Colon polyps Neg Hx   . Esophageal cancer Neg Hx   . Rectal cancer Neg Hx   . Stomach cancer Neg Hx    Social History   Socioeconomic History  . Marital status: Married    Spouse name:  Not on file  . Number of children: 1  . Years of education: 8  . Highest education level: Bachelor's degree (e.g., BA, AB, BS)  Occupational History  . Occupation: disabled    Employer: DISABLED    Comment: laboratory computing/programming  Tobacco Use  . Smoking status: Never Smoker  . Smokeless tobacco: Never Used  Substance and Sexual Activity  . Alcohol use: Not Currently  . Drug use: No  . Sexual activity: Not Currently  Other Topics Concern  . Not on file  Social History Narrative   Household-- pt , wife, adopted  daughter (h/o substance abuse,on a methadone program, doing better )   Social Determinants of Health   Financial Resource Strain: Low Risk   . Difficulty of Paying Living Expenses: Not hard at all  Food Insecurity: No Food Insecurity  . Worried About Charity fundraiser in the Last Year: Never true  . Ran Out of Food in the Last Year: Never true  Transportation Needs: No Transportation Needs  . Lack of Transportation (Medical): No  . Lack of Transportation (Non-Medical): No  Physical Activity:   . Days of Exercise per Week:   . Minutes of Exercise per Session:   Stress:   . Feeling of Stress :   Social Connections:   . Frequency of Communication with Friends and Family:   . Frequency of Social Gatherings with Friends and Family:   . Attends Religious Services:   . Active Member of Clubs or Organizations:   . Attends Archivist Meetings:   [name] Marital Status:     Outpatient Encounter Medications as of 04/11/2020  Medication Sig  . acetaminophen (TYLENOL) 325 MG tablet Take 2 tablets (650 mg total) by mouth every 6 (six) hours as needed (mild pain; headache).  [name] albuterol (PROVENTIL) (2.5 MG/3ML) 0.083% nebulizer solution Take 3 mLs (2.5 mg total) by nebulization every 6 (six) hours as needed for wheezing or shortness of breath.  [name] albuterol (VENTOLIN HFA) 108 (90 Base) MCG/ACT inhaler Inhale 2 puffs into the lungs every 6 (six) hours as needed  for wheezing or shortness of breath.  [name] asenapine (SAPHRIS) 5 MG SUBL Place 5 mg under the tongue at bedtime.   [name] augmented betamethasone dipropionate (DIPROLENE-AF) 0.05 % ointment Apply topically 2 (two) times daily as needed.  [name] azelastine (ASTELIN) 0.1 % nasal spray Place 1 spray into both nostrils as needed for rhinitis. To replace Flonase  . budesonide-formoterol (SYMBICORT) 160-4.5 MCG/ACT inhaler Inhale 2 puffs into the lungs 2 (two) times daily.   . clonazePAM (KLONOPIN) 0.5 MG tablet Take 0.5 mg by mouth See admin instructions. #  2 at 8am, #1 at 2pm [facility])  . clopidogrel (PLAVIX) 75 MG tablet Take 1 tablet (75 mg total) by mouth daily.  . DULoxetine (CYMBALTA) 60 MG capsule Take 60 mg by mouth every morning.   [name] esomeprazole (NEXIUM) 40 MG capsule Take 40 mg by mouth every morning.   . fexofenadine (ALLEGRA) 180 MG tablet Take 180 mg by mouth every morning. 8am [name])  . fluticasone (VERAMYST) 27.5 MCG/SPRAY nasal spray Place 2 sprays into the nose daily as needed for rhinitis or allergies.   [name] ketoconazole (NIZORAL) 2 % cream apply to the affected topical area once daily (Patient taking differently: Apply 1 application topically daily. )  . lamoTRIgine (LAMICTAL) 25 MG tablet Take 50 mg by mouth at bedtime.  . Lancets (ONETOUCH ULTRASOFT) lancets Check blood sugars no more than twice daily  . losartan (COZAAR) 25 MG tablet TAKE 1 TABLET BY MOUTH EVERY EVENING  (Patient taking differently: Take 25 mg by mouth daily. )  . montelukast (SINGULAIR) 10 MG tablet Take 10 mg by mouth at bedtime. [name]  . Multiple Vitamin (MULTIVITAMIN) tablet Take 1 tablet by mouth daily.   [name] VERIO test strip use to check blood sugar no more than twice daily  . pioglitazone-metformin (ACTOPLUS MET) 15-850 MG tablet TAKE ONE TABLET BY MOUTH TWICE DAILY WITH MEALS (Patient taking differently: Take 1 tablet by mouth 2 (two) times daily with a meal. )  . simvastatin (ZOCOR) 40 MG tablet Take 1  tablet (40 mg total) by mouth at bedtime.  . solifenacin (VESICARE) 10 MG tablet Take 10 mg by mouth daily.  . tamsulosin (FLOMAX) 0.4 MG CAPS capsule Take 1 capsule (0.4 mg total) by mouth daily after supper.  . traZODone (DESYREL) 50 MG tablet Take 50 mg by mouth at bedtime as needed for sleep. [name]  . EPIPEN 2-PAK 0.3 MG/0.3ML SOAJ injection Reported on 04/29/2016  . [DISCONTINUED] predniSONE (DELTASONE) 10 MG tablet Take 3 tablets daily for 3 days then 2 tablets daily for 3 days then 1 tablet daily till all tablets are done.   No facility-administered encounter medications on file as of 04/11/2020.    Activities of Daily Living In your present state of health, do you have any difficulty performing the following activities: 04/11/2020 02/06/2020  Hearing? Y Y  Comment wears hearing aid -  Vision? N N  Difficulty concentrating or making decisions? N N  Walking or climbing stairs? N N  Dressing or bathing? N N  Doing errands, shopping? N N  Preparing Food and eating ? N -  Using the Toilet? N -  In the past six months, have you accidently leaked urine? N -  Do you have problems with loss of bowel control? N -  Managing your Medications? N -  Managing your Finances? N -  Housekeeping or managing your Housekeeping? N -  Some recent data might be hidden    Patient Care Team: Colon Branch, MD as PCP - General (Internal Medicine) [name], Darrick Grinder, MD as Consulting Physician (Allergy and Immunology) Norma Fredrickson, MD as Consulting Physician (Psychiatry) Calvert Cantor, MD as Consulting Physician (Ophthalmology) Gean Birchwood, DPM as Consulting Physician (Podiatry) Day, Melvenia Beam, [facility] as Pharmacist (Pharmacist)   Assessment:   This is a routine wellness examination for [place]. Physical assessment deferred to PCP.   Exercise Activities and Dietary recommendations Current Exercise Habits: The patient does not participate in regular exercise at present, Exercise limited by: None  identified Diet (  meal preparation, eat out, water intake, caffeinated beverages, dairy products, fruits and vegetables): in general, an "unhealthy" diet   Goals    . Blood pressure goal less than 140/90    . Blood sugar goal less than 7%    . Check blood pressure 2-3 times per week and record readings    . Check blood sugar when feeling dizzy to rule out low blood sugar as a cause    . Discuss possibility of Trelegy with Dr. [name]    . Increase physical activity     Decrease the number of breaks needed when mowing the lawn.    . LDL goal less than 70    . Pharmacy Care Plan     CARE PLAN ENTRY  Current Barriers:  . Chronic Disease Management support, education, and care coordination needs related to Asthma, Diabetes, Hypertension, Hyperlipidemia, Bipolar Disorder, GERD, Allergic Rhinitis, Overactive Bladder, BPH, Insomnia  Pharmacist Clinical Goal(s):  [name] Asthma o Over the next 90 days patient will use inhalers as prescribed. o Over the next 90 days patient will have minimal to no asthma exacerbations . Hypertension o Over the next 90 days patient's blood pressure will be less than 140/90 . Diabetes o Over the next 90 days patient's a1c will be less than 7% . Hyperlipidemia o Over the next 90 days patient's LDL will be less than 70  Interventions: . Comprehensive medication review performed. . Asthma o Discussed the possibility of Trelegy and it's role to help control the patient's asthma. Will consider this titration if the patient continues to have difficulty . Hypertension o Check blood pressure 2-3 times per week and record readings  . Diabetes o Check blood sugar when feeling dizzy to assure low blood sugar is not the cause of dizziness Patient Self Care Activities:  . Patient verbalizes understanding of plan to follow as described above, Self administers medications as prescribed, Calls pharmacy for medication refills, and Calls provider office for new concerns or  questions . Asthma o Over the next 90 days patient will take inhalers as prescribed.  . Hypertension o Over the next 90 days patient will check blood pressure 2-3 times per week and record readings  . Diabetes o Over the next 90 days patient will check blood sugar when feeling dizzy to assure low blood sugar is not the cause of dizziness  Initial goal documentation     . Prevent Falls       Fall Risk Fall Risk  04/11/2020 07/12/2019 02/13/2019 07/31/2018 02/09/2017  Falls in the past year? _0 No Yes  Number falls in past yr: 1 0 1 - 2 or more  Injury with Fall? 0 0 0 - -  Risk Factor Category  - - - - -  Risk for fall due to : - - Impaired balance/gait - Impaired balance/gait;History of fall(s)  Risk for fall due to: Comment - - - - -  Follow up Education provided;Falls prevention discussed Falls evaluation completed - - Falls prevention discussed   Depression Screen PHQ 2/9 Scores 04/11/2020 01/10/2020 02/13/2019 07/31/2018  PHQ - 2 Score 1 2 0 0  PHQ- 9 Score - 9 - -  Exception Documentation - - - -  Not completed - - - -    Cognitive Function Ad8 score reviewed for issues:  Issues making decisions:no  Less interest in hobbies / activities:no  Repeats questions, stories (family complaining):no  Trouble using ordinary gadgets (microwave, computer, phone):no  Forgets the month  or year: no  Mismanaging finances: no  Remembering appts:no Daily problems with thinking and/or memory:no Ad8 score is=0     MMSE - Mini Mental State Exam 02/13/2019 02/09/2017  Not completed: Unable to complete -  Orientation to time - 5  Orientation to Place - 5  Registration - 3  Attention/ Calculation - 5  Recall - 3  Language- name 2 objects - 2  Language- repeat - 1  Language- follow 3 step command - 3  Language- read & follow direction - 1  Write a sentence - 1  Copy design - 1  Total score - 30        Immunization History  Administered Date(s) Administered  . Fluad  Quad(high Dose 65+) 07/12/2019  . H1N1 11/14/2008  . Influenza Split 08/12/2011, 08/03/2012  . Influenza Whole 08/22/2009, 08/17/2010  . Influenza, High Dose Seasonal PF 08/28/2018  . Influenza,inj,Quad PF,6+ Mos 09/25/2013, 10/14/2014, 09/25/2015  . Influenza-Unspecified 08/04/2016, 07/22/2017  . PFIZER SARS-COV-2 Vaccination 12/23/2019, 01/16/2020  . Pneumococcal Conjugate-13 02/12/2015  . Pneumococcal Polysaccharide-23 11/15/2008, 12/27/2013  . Td 08/22/2009, 07/20/2010, 12/15/2016  . Zoster 04/20/2012  . Zoster Recombinat (Shingrix) 03/16/2017, 09/15/2017   Screening Tests Health Maintenance  Topic Date Due  . OPHTHALMOLOGY EXAM  06/07/2020  . INFLUENZA VACCINE  06/15/2020  . HEMOGLOBIN A1C  08/08/2020  . FOOT EXAM  01/09/2021  . TETANUS/TDAP  12/15/2026  . COVID-19 Vaccine  Completed  . Hepatitis C Screening  Completed  . PNA vac Low Risk Adult  Completed        Plan:    Please schedule your next medicare wellness visit with me in 1 yr.  Continue to eat heart healthy diet (full of fruits, vegetables, whole grains, lean protein, water--limit salt, fat, and sugar intake) and increase physical activity as tolerated.  Continue doing brain stimulating activities (puzzles, reading, adult coloring books, staying active) to keep memory sharp.    I have personally reviewed and noted the following in the patient's chart:   . Medical and social history . Use of alcohol, tobacco or illicit drugs  . Current medications and supplements . Functional ability and status . Nutritional status . Physical activity . Advanced directives . List of other physicians . Hospitalizations, surgeries, and ER visits in previous 12 months . Vitals . Screenings to include cognitive, depression, and falls . Referrals and appointments  In addition, I have reviewed and discussed with patient certain preventive protocols, quality metrics, and best practice recommendations. A written personalized  care plan for preventive services as well as general preventive health recommendations were provided to patient.     Naaman Plummer [place], [place]  04/11/2020

## 2020-04-11 ENCOUNTER — Encounter: Payer: Self-pay | Admitting: *Deleted

## 2020-04-11 ENCOUNTER — Ambulatory Visit (INDEPENDENT_AMBULATORY_CARE_PROVIDER_SITE_OTHER): Payer: Medicare Other | Admitting: *Deleted

## 2020-04-11 ENCOUNTER — Other Ambulatory Visit: Payer: Self-pay

## 2020-04-11 DIAGNOSIS — Z0000 Encounter for general adult medical examination without abnormal findings: Secondary | ICD-10-CM

## 2020-04-11 NOTE — Patient Instructions (Signed)
Please schedule your next medicare wellness visit with me in 1 yr.  Continue to eat heart healthy diet (full of fruits, vegetables, whole grains, lean protein, water--limit salt, fat, and sugar intake) and increase physical activity as tolerated.  Continue doing brain stimulating activities (puzzles, reading, adult coloring books, staying active) to keep memory sharp.    Ryan Zhang , Thank you for taking time to come for your Medicare Wellness Visit. I appreciate your ongoing commitment to your health goals. Please review the following plan we discussed and let me know if I can assist you in the future.   These are the goals we discussed: Goals    . Blood pressure goal less than 140/90    . Blood sugar goal less than 7%    . Check blood pressure 2-3 times per week and record readings    . Check blood sugar when feeling dizzy to rule out low blood sugar as a cause    . Discuss possibility of Trelegy with Dr. [name]    . Increase physical activity     Decrease the number of breaks needed when mowing the lawn.    . LDL goal less than 70    . Pharmacy Care Plan     CARE PLAN ENTRY  Current Barriers:  . Chronic Disease Management support, education, and care coordination needs related to Asthma, Diabetes, Hypertension, Hyperlipidemia, Bipolar Disorder, GERD, Allergic Rhinitis, Overactive Bladder, BPH, Insomnia  Pharmacist Clinical Goal(s):  [name] Asthma o Over the next 90 days patient will use inhalers as prescribed. o Over the next 90 days patient will have minimal to no asthma exacerbations . Hypertension o Over the next 90 days patient's blood pressure will be less than 140/90 . Diabetes o Over the next 90 days patient's a1c will be less than 7% . Hyperlipidemia o Over the next 90 days patient's LDL will be less than 70  Interventions: . Comprehensive medication review performed. . Asthma o Discussed the possibility of Trelegy and it's role to help control the patient's  asthma. Will consider this titration if the patient continues to have difficulty . Hypertension o Check blood pressure 2-3 times per week and record readings  . Diabetes o Check blood sugar when feeling dizzy to assure low blood sugar is not the cause of dizziness Patient Self Care Activities:  . Patient verbalizes understanding of plan to follow as described above, Self administers medications as prescribed, Calls pharmacy for medication refills, and Calls provider office for new concerns or questions . Asthma o Over the next 90 days patient will take inhalers as prescribed.  . Hypertension o Over the next 90 days patient will check blood pressure 2-3 times per week and record readings  . Diabetes o Over the next 90 days patient will check blood sugar when feeling dizzy to assure low blood sugar is not the cause of dizziness  Initial goal documentation     . Prevent Falls       This is a list of the screening recommended for you and due dates:  Health Maintenance  Topic Date Due  . Eye exam for diabetics  06/07/2020  . Flu Shot  06/15/2020  . Hemoglobin A1C  08/08/2020  . Complete foot exam   01/09/2021  . Tetanus Vaccine  12/15/2026  . COVID-19 Vaccine  Completed  .  Hepatitis C: One time screening is recommended by Center for Disease Control  (CDC) for  adults born from [birth year] through 1963.  Completed  . Pneumonia vaccines  Completed    Preventive Care 31 Years and Older, Male Preventive care refers to lifestyle choices and visits with your health care provider that can promote health and wellness. This includes:  A yearly physical exam. This is also called an annual well check.  Regular dental and eye exams.  Immunizations.  Screening for certain conditions.  Healthy lifestyle choices, such as diet and exercise. What can I expect for my preventive care visit? Physical exam Your health care provider will check:  Height and weight. These may be used to calculate body  mass index (BMI), which is a measurement that tells if you are at a healthy weight.  Heart rate and blood pressure.  Your skin for abnormal spots. Counseling Your health care provider may ask you questions about:  Alcohol, tobacco, and drug use.  Emotional well-being.  Home and relationship well-being.  Sexual activity.  Eating habits.  History of falls.  Memory and ability to understand (cognition).  Work and work Statistician. What immunizations do I need?  Influenza (flu) vaccine  This is recommended every year. Tetanus, diphtheria, and pertussis (Tdap) vaccine  You may need a Td booster every 10 years. Varicella (chickenpox) vaccine  You may need this vaccine if you have not already been vaccinated. Zoster (shingles) vaccine  You may need this after age 53. Pneumococcal conjugate (PCV13) vaccine  One dose is recommended after age 61. Pneumococcal polysaccharide (PPSV23) vaccine  One dose is recommended after age 32. Measles, mumps, and rubella (MMR) vaccine  You may need at least one dose of MMR if you were born in 1957 or later. You may also need a second dose. Meningococcal conjugate (MenACWY) vaccine  You may need this if you have certain conditions. Hepatitis A vaccine  You may need this if you have certain conditions or if you travel or work in places where you may be exposed to hepatitis A. Hepatitis B vaccine  You may need this if you have certain conditions or if you travel or work in places where you may be exposed to hepatitis B. Haemophilus influenzae type b (Hib) vaccine  You may need this if you have certain conditions. You may receive vaccines as individual doses or as more than one vaccine together in one shot (combination vaccines). Talk with your health care provider about the risks and benefits of combination vaccines. What tests do I need? Blood tests  Lipid and cholesterol levels. These may be checked every 5 years, or more  frequently depending on your overall health.  Hepatitis C test.  Hepatitis B test. Screening  Lung cancer screening. You may have this screening every year starting at age 42 if you have a 30-pack-year history of smoking and currently smoke or have quit within the past 15 years.  Colorectal cancer screening. All adults should have this screening starting at age 1 and continuing until age 81. Your health care provider may recommend screening at age 21 if you are at increased risk. You will have tests every 1-10 years, depending on your results and the type of screening test.  Prostate cancer screening. Recommendations will vary depending on your family history and other risks.  Diabetes screening. This is done by checking your blood sugar (glucose) after you have not eaten for a while (fasting). You may have this done every 1-3 years.  Abdominal aortic aneurysm (AAA) screening. You may need this if you are a current or former smoker.  Sexually transmitted disease (STD)  testing. Follow these instructions at home: Eating and drinking  Eat a diet that includes fresh fruits and vegetables, whole grains, lean protein, and low-fat dairy products. Limit your intake of foods with high amounts of sugar, saturated fats, and salt.  Take vitamin and mineral supplements as recommended by your health care provider.  Do not drink alcohol if your health care provider tells you not to drink.  If you drink alcohol: ? Limit how much you have to 0-2 drinks a day. ? Be aware of how much alcohol is in your drink. In the [place] one drink equals one 12 oz bottle of beer (355 mL), one 5 oz glass of wine (148 mL), or one 1 oz glass of hard liquor (44 mL). Lifestyle  Take daily care of your teeth and gums.  Stay active. Exercise for at least 30 minutes on 5 or more days each week.  Do not use any products that contain nicotine or tobacco, such as cigarettes, e-cigarettes, and chewing tobacco. If you need  help quitting, ask your health care provider.  If you are sexually active, practice safe sex. Use a condom or other form of protection to prevent STIs (sexually transmitted infections).  Talk with your health care provider about taking a low-dose aspirin or statin. What's next?  Visit your health care provider once a year for a well check visit.  Ask your health care provider how often you should have your eyes and teeth checked.  Stay up to date on all vaccines. This information is not intended to replace advice given to you by your health care provider. Make sure you discuss any questions you have with your health care provider. Document Revised: 10/26/2018 Document Reviewed: 10/26/2018 Elsevier Patient Education  2020 [employer].

## 2020-04-16 DIAGNOSIS — J301 Allergic rhinitis due to pollen: Secondary | ICD-10-CM | POA: Diagnosis not present

## 2020-04-16 DIAGNOSIS — J3089 Other allergic rhinitis: Secondary | ICD-10-CM | POA: Diagnosis not present

## 2020-04-23 DIAGNOSIS — J3089 Other allergic rhinitis: Secondary | ICD-10-CM | POA: Diagnosis not present

## 2020-04-23 DIAGNOSIS — J301 Allergic rhinitis due to pollen: Secondary | ICD-10-CM | POA: Diagnosis not present

## 2020-04-26 ENCOUNTER — Other Ambulatory Visit: Payer: Self-pay

## 2020-04-26 ENCOUNTER — Emergency Department (HOSPITAL_BASED_OUTPATIENT_CLINIC_OR_DEPARTMENT_OTHER): Payer: Medicare Other

## 2020-04-26 ENCOUNTER — Emergency Department (HOSPITAL_BASED_OUTPATIENT_CLINIC_OR_DEPARTMENT_OTHER)
Admission: EM | Admit: 2020-04-26 | Discharge: 2020-04-26 | Disposition: A | Discharge status: Home / Self Care | Payer: Medicare Other | Attending: Emergency Medicine | Admitting: Emergency Medicine

## 2020-04-26 ENCOUNTER — Encounter (HOSPITAL_BASED_OUTPATIENT_CLINIC_OR_DEPARTMENT_OTHER): Payer: Self-pay | Admitting: Emergency Medicine

## 2020-04-26 DIAGNOSIS — W010XXA Fall on same level from slipping, tripping and stumbling without subsequent striking against object, initial encounter: Secondary | ICD-10-CM | POA: Insufficient documentation

## 2020-04-26 DIAGNOSIS — Z79899 Other long term (current) drug therapy: Secondary | ICD-10-CM | POA: Diagnosis not present

## 2020-04-26 DIAGNOSIS — I517 Cardiomegaly: Secondary | ICD-10-CM | POA: Diagnosis not present

## 2020-04-26 DIAGNOSIS — S4991XA Unspecified injury of right shoulder and upper arm, initial encounter: Secondary | ICD-10-CM | POA: Diagnosis not present

## 2020-04-26 DIAGNOSIS — Z7984 Long term (current) use of oral hypoglycemic drugs: Secondary | ICD-10-CM | POA: Diagnosis not present

## 2020-04-26 DIAGNOSIS — Y999 Unspecified external cause status: Secondary | ICD-10-CM | POA: Insufficient documentation

## 2020-04-26 DIAGNOSIS — S6991XA Unspecified injury of right wrist, hand and finger(s), initial encounter: Secondary | ICD-10-CM | POA: Diagnosis not present

## 2020-04-26 DIAGNOSIS — J449 Chronic obstructive pulmonary disease, unspecified: Secondary | ICD-10-CM | POA: Insufficient documentation

## 2020-04-26 DIAGNOSIS — Z7901 Long term (current) use of anticoagulants: Secondary | ICD-10-CM | POA: Insufficient documentation

## 2020-04-26 DIAGNOSIS — W19XXXA Unspecified fall, initial encounter: Secondary | ICD-10-CM

## 2020-04-26 DIAGNOSIS — Y929 Unspecified place or not applicable: Secondary | ICD-10-CM | POA: Insufficient documentation

## 2020-04-26 DIAGNOSIS — I10 Essential (primary) hypertension: Secondary | ICD-10-CM | POA: Insufficient documentation

## 2020-04-26 DIAGNOSIS — Y939 Activity, unspecified: Secondary | ICD-10-CM | POA: Diagnosis not present

## 2020-04-26 DIAGNOSIS — S4992XA Unspecified injury of left shoulder and upper arm, initial encounter: Secondary | ICD-10-CM | POA: Diagnosis not present

## 2020-04-26 DIAGNOSIS — S199XXA Unspecified injury of neck, initial encounter: Secondary | ICD-10-CM | POA: Diagnosis not present

## 2020-04-26 DIAGNOSIS — M25511 Pain in right shoulder: Secondary | ICD-10-CM | POA: Diagnosis not present

## 2020-04-26 DIAGNOSIS — M79645 Pain in left finger(s): Secondary | ICD-10-CM | POA: Diagnosis not present

## 2020-04-26 DIAGNOSIS — E119 Type 2 diabetes mellitus without complications: Secondary | ICD-10-CM | POA: Insufficient documentation

## 2020-04-26 DIAGNOSIS — M25512 Pain in left shoulder: Secondary | ICD-10-CM | POA: Diagnosis not present

## 2020-04-26 DIAGNOSIS — S1093XA Contusion of unspecified part of neck, initial encounter: Secondary | ICD-10-CM | POA: Insufficient documentation

## 2020-04-26 DIAGNOSIS — S40011A Contusion of right shoulder, initial encounter: Secondary | ICD-10-CM | POA: Diagnosis not present

## 2020-04-26 DIAGNOSIS — T07XXXA Unspecified multiple injuries, initial encounter: Secondary | ICD-10-CM

## 2020-04-26 DIAGNOSIS — S40012A Contusion of left shoulder, initial encounter: Secondary | ICD-10-CM | POA: Diagnosis not present

## 2020-04-26 DIAGNOSIS — S0990XA Unspecified injury of head, initial encounter: Secondary | ICD-10-CM | POA: Diagnosis not present

## 2020-04-26 DIAGNOSIS — S0083XA Contusion of other part of head, initial encounter: Secondary | ICD-10-CM | POA: Diagnosis not present

## 2020-04-26 DIAGNOSIS — M79644 Pain in right finger(s): Secondary | ICD-10-CM | POA: Diagnosis not present

## 2020-04-26 DIAGNOSIS — S3993XA Unspecified injury of pelvis, initial encounter: Secondary | ICD-10-CM | POA: Diagnosis not present

## 2020-04-26 DIAGNOSIS — S6992XA Unspecified injury of left wrist, hand and finger(s), initial encounter: Secondary | ICD-10-CM | POA: Diagnosis not present

## 2020-04-26 DIAGNOSIS — S60011A Contusion of right thumb without damage to nail, initial encounter: Secondary | ICD-10-CM | POA: Diagnosis not present

## 2020-04-26 MED ORDER — ACETAMINOPHEN 500 MG PO TABS
1000.0000 mg | ORAL_TABLET | Freq: Once | ORAL | Status: AC
  Administered 2020-04-26: 1000 mg via ORAL
  Filled 2020-04-26: qty 2

## 2020-04-26 NOTE — ED Triage Notes (Signed)
Pt reports falling backwards down 5 steps after twisting left ankle. Pt c/o headache, neck pain, and body aches. Pt denies loc, denies dizziness, endorses blood thinners.  Pt ambulatory

## 2020-04-26 NOTE — ED Notes (Signed)
Patient transported to CT and XR 

## 2020-04-26 NOTE — ED Provider Notes (Signed)
[facility] EMERGENCY DEPARTMENT Provider Note   CSN: [id number] Arrival date & time: 04/26/20  1140     History Chief Complaint  Patient presents with  . Fall    Ryan Zhang is a 74 y.o. male.  Pt presents to the ED today with a fall.  He twisted his left ankle and fell backwards down 5 steps.  He did hit his head and neck.  No loc.  He is on plavix.  He c/o pain to both shoulders and thumbs and pain to his head and neck.  He is ambulatory and said his ankle does not hurt.  He falls frequently and walks with a cane.        Past Medical History:  Diagnosis Date  . Allergy    allergy shots Dr. Velora Heckler  . Anemia   . Anxiety   . Asthma    moderate persistant  . Bipolar affective ([place])   . Cataract    both eyes  . COPD (chronic obstructive pulmonary disease) ([place])   . Depression   . Diabetes mellitus   . DJD (degenerative joint disease)   . Dysphagia   . Gallstones    hx of, s/p cholecystectomy  . GERD (gastroesophageal reflux disease)   . Hepatitis A    as teenager 11's  . Hollenhorst plaque    right eye  . Hyperlipidemia   . Hypertension   . Kidney stones    hx of  . Meniere's disease   . Motility disorder, esophageal   . Obesity   . Pancreatitis   . Personal history of colonic polyps 11/2004   hyperplastic.  . Stroke [facility])    per MRI    Patient Active Problem List   Diagnosis Date Noted  . Type 2 diabetes mellitus ([place]) 02/06/2020  . CAP (community acquired pneumonia) 07/18/2018  . Acute respiratory failure ([place]) 07/18/2018  . Acute respiratory failure with hypoxia ([place]) 07/18/2018  . PCP NOTES >>>>>>>>>>>>>>>>>>>>>>>>>>>>>>> 01/12/2016  . Dry skin 05/09/2015  . Folliculitis 51/70/0174  . Hearing loss in right ear 08/05/2014  . Pre-ulcerative corn or callous 12/27/2013  . Panic attack as reaction to stress 04/20/2012  . Dysphagia, neurologic 01/07/2012  . Morbid obesity ([place]) 01/07/2012  . Chest pain, musculoskeletal 12/07/2011  .  Black-out (not amnesia) 12/07/2011  . Candidiasis of skin 08/12/2011  . Annual physical exam 06/07/2011  . OLECRANON BURSITIS 01/29/2011  . PARTIAL ARTERIAL OCCLUSION OF RETINA 05/27/2010  . Essential hypertension, benign 01/16/2010  . ABNORMAL ELECTROCARDIOGRAM 01/14/2010  . Asthma exacerbation 11/26/2009  . Asthma 10/14/2009  . GERD 10/14/2009  . LACERATION OF FINGER 05/14/2009  . Bipolar disorder ([place]) 02/20/2009  . PHIMOSIS 02/20/2009  . KNEE PAIN, CHRONIC 11/20/2008  . HIP PAIN, RIGHT 08/19/2008  . DM type 2 with diabetic peripheral neuropathy ([place]) 02/22/2007  . Hyperlipidemia associated with type 2 diabetes mellitus ([place]) 02/22/2007  . ALLERGIC RHINITIS, SEASONAL 02/22/2007  . LOW BACK PAIN, CHRONIC 02/22/2007  . MENIERE'S DISEASE, HX OF 02/22/2007    Past Surgical History:  Procedure Laterality Date  . CATARACT EXTRACTION Bilateral 09/2015 and 10/2015  . CHOLECYSTECTOMY    . COLONOSCOPY    . TOTAL KNEE ARTHROPLASTY Bilateral    both knees       Family History  Problem Relation Age of Onset  . Diabetes Father   . Heart disease Brother   . Heart disease Maternal Grandmother   . Cancer Sister        unknown type  .  Colon cancer Neg Hx   . Prostate cancer Neg Hx   . Colon polyps Neg Hx   . Esophageal cancer Neg Hx   . Rectal cancer Neg Hx   . Stomach cancer Neg Hx     Social History   Tobacco Use  . Smoking status: Never Smoker  . Smokeless tobacco: Never Used  Substance Use Topics  . Alcohol use: Not Currently  . Drug use: No    Home Medications Prior to Admission medications   Medication Sig Start Date End Date Taking? Authorizing Provider  acetaminophen (TYLENOL) 325 MG tablet Take 2 tablets (650 mg total) by mouth every 6 (six) hours as needed (mild pain; headache). 02/06/20   Georgette Shell, MD  albuterol (PROVENTIL) (2.5 MG/3ML) 0.083% nebulizer solution Take 3 mLs (2.5 mg total) by nebulization every 6 (six) hours as needed for wheezing or  shortness of breath. 01/24/18   Colon Branch, MD  albuterol (VENTOLIN HFA) 108 (90 Base) MCG/ACT inhaler Inhale 2 puffs into the lungs every 6 (six) hours as needed for wheezing or shortness of breath. 02/11/20   Colon Branch, MD  asenapine (SAPHRIS) 5 MG SUBL Place 5 mg under the tongue at bedtime.     [provider]  augmented betamethasone dipropionate (DIPROLENE-AF) 0.05 % ointment Apply topically 2 (two) times daily as needed. 03/13/18   Colon Branch, MD  azelastine (ASTELIN) 0.1 % nasal spray Place 1 spray into both nostrils as needed for rhinitis. To replace Flonase    [provider]  budesonide-formoterol (SYMBICORT) 160-4.5 MCG/ACT inhaler Inhale 2 puffs into the lungs 2 (two) times daily.     [provider]  clonazePAM (KLONOPIN) 0.5 MG tablet Take 0.5 mg by mouth See admin instructions. #2 at 8am, #1 at 2pm [facility])    [provider]  clopidogrel (PLAVIX) 75 MG tablet Take 1 tablet (75 mg total) by mouth daily. 11/05/19   Colon Branch, MD  DULoxetine (CYMBALTA) 60 MG capsule Take 60 mg by mouth every morning.     [provider]  EPIPEN 2-PAK 0.3 MG/0.3ML SOAJ injection Reported on 04/29/2016 07/04/14   [provider]  esomeprazole (NEXIUM) 40 MG capsule Take 40 mg by mouth every morning.     [provider]  fexofenadine (ALLEGRA) 180 MG tablet Take 180 mg by mouth every morning. 8am [name])    [provider]  fluticasone (VERAMYST) 27.5 MCG/SPRAY nasal spray Place 2 sprays into the nose daily as needed for rhinitis or allergies.     [provider]  ketoconazole (NIZORAL) 2 % cream apply to the affected topical area once daily Patient taking differently: Apply 1 application topically daily.  11/21/19   Colon Branch, MD  lamoTRIgine (LAMICTAL) 25 MG tablet Take 50 mg by mouth at bedtime.    [provider]  Lancets [name] ULTRASOFT) lancets Check blood sugars no more than twice daily 12/09/16    Colon Branch, MD  losartan (COZAAR) 25 MG tablet TAKE 1 TABLET BY MOUTH EVERY EVENING  Patient taking differently: Take 25 mg by mouth daily.  01/21/20   Colon Branch, MD  montelukast (SINGULAIR) 10 MG tablet Take 10 mg by mouth at bedtime. [name]    [provider]  Multiple Vitamin (MULTIVITAMIN) tablet Take 1 tablet by mouth daily.     [provider]  [facility] VERIO test strip use to check blood sugar no more than twice daily 12/03/19  Colon Branch, MD  pioglitazone-metformin (ACTOPLUS MET) [phone] MG tablet TAKE ONE TABLET BY MOUTH TWICE DAILY WITH MEALS Patient taking differently: Take 1 tablet by mouth 2 (two) times daily with a meal.  11/20/19   Colon Branch, MD  simvastatin (ZOCOR) 40 MG tablet Take 1 tablet (40 mg total) by mouth at bedtime. 01/14/20   Colon Branch, MD  solifenacin (VESICARE) 10 MG tablet Take 10 mg by mouth daily. 10/11/19   [provider]  tamsulosin (FLOMAX) 0.4 MG CAPS capsule Take 1 capsule (0.4 mg total) by mouth daily after supper. 02/13/20   Colon Branch, MD  traZODone (DESYREL) 50 MG tablet Take 50 mg by mouth at bedtime as needed for sleep. [name] 07/09/14   Midge Minium, MD    Allergies    Celexa  [citalopram hydrobromide], Depakote  [divalproex sodium], Methocarbamol, Paroxetine hcl, Smz-tmp ds [sulfamethoxazole-trimethoprim], Divalproex sodium, Methocarbamol, Other, Paroxetine, Percocet [oxycodone-acetaminophen], Risperidone, Wellbutrin [bupropion], and Citalopram hydrobromide  Review of Systems   Review of Systems  Musculoskeletal: Positive for neck pain.       Bilateral shoulder and thumb pain  Neurological: Positive for headaches.  All other systems reviewed and are negative.   Physical Exam Updated Vital Signs BP (!) 144/84 (BP Location: Left Arm)   Pulse 80   Temp 98 F (36.7 C)   Resp 18   Ht 5' 10"  (1.778 m)   Wt 113.4 kg   SpO2 100%   BMI 35.87 kg/m   Physical Exam Vitals and nursing note reviewed.    Constitutional:      Appearance: Normal appearance.  HENT:     Head: Normocephalic.      Right Ear: External ear normal.     Left Ear: External ear normal.     Nose: Nose normal.     Mouth/Throat:     Mouth: Mucous membranes are moist.     Pharynx: Oropharynx is clear.  Eyes:     Extraocular Movements: Extraocular movements intact.     Conjunctiva/sclera: Conjunctivae normal.     Pupils: Pupils are equal, round, and reactive to light.  Neck:   Cardiovascular:     Rate and Rhythm: Normal rate and regular rhythm.     Pulses: Normal pulses.     Heart sounds: Normal heart sounds.  Pulmonary:     Effort: Pulmonary effort is normal.     Breath sounds: Normal breath sounds.  Abdominal:     General: Abdomen is flat. Bowel sounds are normal.     Palpations: Abdomen is soft.  Musculoskeletal:     Right shoulder: Tenderness present.     Left shoulder: Tenderness present.     Cervical back: Normal range of motion and neck supple.     Comments: Bilateral thumb pain  Skin:    General: Skin is warm.     Capillary Refill: Capillary refill takes less than 2 seconds.  Neurological:     General: No focal deficit present.     Mental Status: He is alert and oriented to person, place, and time.  Psychiatric:        Mood and Affect: Mood normal.        Behavior: Behavior normal.     ED Results / Procedures / Treatments   Labs (all labs ordered are listed, but only abnormal results are displayed) Labs Reviewed - No data to display  EKG None  Radiology DG Chest 2 View  Result Date: 04/26/2020 CLINICAL DATA:  Fall  EXAM: CHEST - 2 VIEW COMPARISON:  02/05/2020 FINDINGS: Cardiomegaly. Bandlike scarring or atelectasis of the right midlung similar to prior exams. Disc degenerative disease of the thoracic spine. IMPRESSION: Cardiomegaly without acute abnormality of the lungs. Electronically Signed   By: Eddie Candle M.D.   On: 04/26/2020 12:50   DG Pelvis 1-2 Views  Result Date:  04/26/2020 CLINICAL DATA:  Fall down steps EXAM: PELVIS - 1-2 VIEW COMPARISON:  07/30/2014 pelvic radiograph FINDINGS: No pelvic fracture or diastasis. No evidence of hip dislocation on this single frontal view. No suspicious focal osseous lesions. IMPRESSION: No pelvic fracture. Electronically Signed   By: Ilona Sorrel M.D.   On: 04/26/2020 12:49   DG Shoulder Right  Result Date: 04/26/2020 CLINICAL DATA:  Fall with bilateral shoulder pain EXAM: RIGHT SHOULDER - 2+ VIEW COMPARISON:  None. FINDINGS: There is no evidence of fracture or dislocation. No notable degenerative spurring or evident bone lesion. IMPRESSION: Negative for fracture or subluxation. Electronically Signed   By: Monte Fantasia M.D.   On: 04/26/2020 13:42   CT Head Wo Contrast  Result Date: 04/26/2020 CLINICAL DATA:  Fall. EXAM: CT HEAD WITHOUT CONTRAST CT CERVICAL SPINE WITHOUT CONTRAST TECHNIQUE: Multidetector CT imaging of the head and cervical spine was performed following the standard protocol without intravenous contrast. Multiplanar CT image reconstructions of the cervical spine were also generated. COMPARISON:  CT cervical spine dated October 30, 2014. CT head dated March 10, 2014. FINDINGS: CT HEAD FINDINGS Brain: No evidence of acute infarction, hemorrhage, hydrocephalus, extra-axial collection or mass lesion/mass effect. Stable mild atrophy. Vascular: Calcified atherosclerosis at the skullbase. No hyperdense vessel. Skull: Normal. Negative for fracture or focal lesion. Sinuses/Orbits: No acute finding. Other: None. CT CERVICAL SPINE FINDINGS Alignment: Normal. Skull base and vertebrae: No acute fracture. No primary bone lesion or focal pathologic process. Soft tissues and spinal canal: No prevertebral fluid or swelling. No visible canal hematoma. Disc levels: Moderate multilevel disc height loss and uncovertebral hypertrophy, similar to prior study. Upper chest: Negative. Other: None. IMPRESSION: 1. No acute intracranial  abnormality. 2. No acute cervical spine fracture or traumatic listhesis. Electronically Signed   By: Titus Dubin M.D.   On: 04/26/2020 12:47   CT Cervical Spine Wo Contrast  Result Date: 04/26/2020 CLINICAL DATA:  Fall. EXAM: CT HEAD WITHOUT CONTRAST CT CERVICAL SPINE WITHOUT CONTRAST TECHNIQUE: Multidetector CT imaging of the head and cervical spine was performed following the standard protocol without intravenous contrast. Multiplanar CT image reconstructions of the cervical spine were also generated. COMPARISON:  CT cervical spine dated October 30, 2014. CT head dated March 10, 2014. FINDINGS: CT HEAD FINDINGS Brain: No evidence of acute infarction, hemorrhage, hydrocephalus, extra-axial collection or mass lesion/mass effect. Stable mild atrophy. Vascular: Calcified atherosclerosis at the skullbase. No hyperdense vessel. Skull: Normal. Negative for fracture or focal lesion. Sinuses/Orbits: No acute finding. Other: None. CT CERVICAL SPINE FINDINGS Alignment: Normal. Skull base and vertebrae: No acute fracture. No primary bone lesion or focal pathologic process. Soft tissues and spinal canal: No prevertebral fluid or swelling. No visible canal hematoma. Disc levels: Moderate multilevel disc height loss and uncovertebral hypertrophy, similar to prior study. Upper chest: Negative. Other: None. IMPRESSION: 1. No acute intracranial abnormality. 2. No acute cervical spine fracture or traumatic listhesis. Electronically Signed   By: Titus Dubin M.D.   On: 04/26/2020 12:47   DG Shoulder Left  Result Date: 04/26/2020 CLINICAL DATA:  Pain following fall EXAM: LEFT SHOULDER - 2+ VIEW COMPARISON:  None. FINDINGS: Oblique  and Y scapular images were obtained. No fracture or dislocation. There is generalized joint space narrowing. No erosive change or intra-articular calcification. Visualized left lung clear. IMPRESSION: Moderate generalized osteoarthritic change. No fracture or dislocation evident.  Electronically Signed   By: Lowella Grip III M.D.   On: 04/26/2020 13:56   DG Finger Thumb Left  Result Date: 04/26/2020 CLINICAL DATA:  Fall, pain EXAM: RIGHT THUMB 2+V; LEFT THUMB 2+V COMPARISON:  None. FINDINGS: There is no evidence of fracture or dislocation. Mild arthrosis of the thumb interphalangeal and metacarpophalangeal joints. Soft tissues are unremarkable. IMPRESSION: No fracture or dislocation of the bilateral thumbs. Electronically Signed   By: Eddie Candle M.D.   On: 04/26/2020 13:46   DG Finger Thumb Right  Result Date: 04/26/2020 CLINICAL DATA:  Fall, pain EXAM: RIGHT THUMB 2+V; LEFT THUMB 2+V COMPARISON:  None. FINDINGS: There is no evidence of fracture or dislocation. Mild arthrosis of the thumb interphalangeal and metacarpophalangeal joints. Soft tissues are unremarkable. IMPRESSION: No fracture or dislocation of the bilateral thumbs. Electronically Signed   By: Eddie Candle M.D.   On: 04/26/2020 13:46    Procedures Procedures (including critical care time)  Medications Ordered in ED Medications  acetaminophen (TYLENOL) tablet 1,000 mg (1,000 mg Oral Given 04/26/20 1309)    ED Course  I have reviewed the triage vital signs and the nursing notes.  Pertinent labs & imaging results that were available during my care of the patient were reviewed by me and considered in my medical decision making (see chart for details).    MDM Rules/Calculators/A&P                          Pt is ambulatory.  He is stable for d/c.  Return if worse.  Final Clinical Impression(s) / ED Diagnoses Final diagnoses:  Fall, initial encounter  Injury of head, initial encounter  Multiple contusions    Rx / DC Orders ED Discharge Orders    None       Isla Pence, MD 04/26/20 1405

## 2020-04-26 NOTE — ED Notes (Signed)
ED Provider at bedside. 

## 2020-04-29 ENCOUNTER — Ambulatory Visit: Payer: Self-pay | Admitting: Pharmacist

## 2020-04-29 NOTE — Chronic Care Management (AMB) (Signed)
Called patient to notify him that his current appointment time of 05/01/20 at 1pm conflicts with a scheduled meeting I have to attend. Tentatively moving patient's appointment to Monday, June 21 at 11am. If this time does not work, am willing to reschedule patient.   Left VM detailing the above.

## 2020-04-30 DIAGNOSIS — J3089 Other allergic rhinitis: Secondary | ICD-10-CM | POA: Diagnosis not present

## 2020-04-30 DIAGNOSIS — J301 Allergic rhinitis due to pollen: Secondary | ICD-10-CM | POA: Diagnosis not present

## 2020-05-01 ENCOUNTER — Telehealth: Payer: Medicare Other

## 2020-05-05 ENCOUNTER — Ambulatory Visit: Payer: Medicare Other | Admitting: Pharmacist

## 2020-05-05 ENCOUNTER — Other Ambulatory Visit: Payer: Self-pay

## 2020-05-05 DIAGNOSIS — J45901 Unspecified asthma with (acute) exacerbation: Secondary | ICD-10-CM

## 2020-05-05 DIAGNOSIS — I10 Essential (primary) hypertension: Secondary | ICD-10-CM
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
## 2020-05-05 NOTE — Patient Instructions (Signed)
Visit Information  Goals Addressed            This Visit's Progress   . Chronic Care Mangement Pharmacy Care Plan       CARE PLAN ENTRY (see longitudinal plan of care for additional care plan information)  Current Barriers:  . Chronic Disease Management support, education, and care coordination needs related to Asthma, Diabetes, Hypertension, Hyperlipidemia, Bipolar Disorder, GERD, Allergic Rhinitis, Overactive Bladder, BPH, Insomnia   Hypertension BP Readings from Last 3 Encounters:  04/26/20 118/70  02/11/20 136/82  02/06/20 (!) 153/78   . Pharmacist Clinical Goal(s): o Over the next 90 days, patient will work with PharmD and providers to maintain BP goal <140/90 . Current regimen:  o Losartan 25mg  daily . Interventions: o Requested that check his BP 2-3 times per week . Patient self care activities - Over the next 90 days, patient will: o Check BP 2-3 times per week, document, and provide at future appointments o Ensure daily salt intake < 2300 mg/Ryan Zhang  Hyperlipidemia Lab Results  Component Value Date/Time   LDLCALC 38 01/10/2020 11:03 AM   . Pharmacist Clinical Goal(s): o Over the next 90 days, patient will work with PharmD and providers to maintain LDL goal < 100 . Current regimen:  o Simvastatin 40mg  daily . Patient self care activities - Over the next 90 days, patient will: o Maintain cholesterol medication regimen.   Diabetes Lab Results  Component Value Date/Time   HGBA1C 6.6 (H) 02/06/2020 05:30 AM   HGBA1C 6.6 (H) 01/10/2020 11:03 AM   . Pharmacist Clinical Goal(s): o Over the next 90 days, patient will work with PharmD and providers to maintain A1c goal <7% . Current regimen:  o Pioglitazone-metformin 15-850mg  twice daily  . Interventions: o Discussed signs/symptoms of hypoglycemia and how to treat them . Patient self care activities - Over the next 90 days, patient will: o Maintain a1c less than 7%  Asthma . Pharmacist Clinical Goal(s) o Over  the next 180 days, patient will work with PharmD and providers to reduce symptoms of asthma . Current regimen:  o Symbicort 160-4.5 2 puffs BID, albuterol neb PRN, albuterol inhaler PRN . Interventions: o Discuss possibility of Trelegy replacing Symbicort to help with symptom improvement  . Patient self care activities - Over the next 180 days, patient will: o Discuss possibility of Trelegy with Dr. 02/08/2020  Medication management . Pharmacist Clinical Goal(s): o Over the next 90 days, patient will work with PharmD and providers to maintain optimal medication adherence . Current pharmacy: [facility] . Interventions o Comprehensive medication review performed. o Continue current medication management strategy . Patient self care activities - Over the next 90 days, patient will: o Focus on medication adherence by filling and taking medications appropriately o Take medications as prescribed o Report any questions or concerns to PharmD and/or provider(s)  Please see past updates related to this goal by clicking on the "Past Updates" button in the selected goal          The patient verbalized understanding of instructions provided today and agreed to receive a mailed copy of patient instruction and/or educational materials.  Telephone follow up appointment with pharmacy team member scheduled for: 10/27/2020  [name] Sherri Mcarthy, PharmD Clinical Pharmacist [facility] [phone]

## 2020-05-07 DIAGNOSIS — J301 Allergic rhinitis due to pollen: Secondary | ICD-10-CM | POA: Diagnosis not present

## 2020-05-07 DIAGNOSIS — J3089 Other allergic rhinitis: Secondary | ICD-10-CM | POA: Diagnosis not present

## 2020-05-09 ENCOUNTER — Other Ambulatory Visit: Payer: Self-pay

## 2020-05-09 ENCOUNTER — Ambulatory Visit (INDEPENDENT_AMBULATORY_CARE_PROVIDER_SITE_OTHER): Payer: Medicare Other | Admitting: Internal Medicine

## 2020-05-09 ENCOUNTER — Encounter: Payer: Self-pay | Admitting: Internal Medicine

## 2020-05-09 VITALS — BP 123/73 | HR 78 | Temp 96.3°F | Resp 18 | Ht 69.0 in | Wt 250.1 lb

## 2020-05-09 DIAGNOSIS — I10 Essential (primary) hypertension: Secondary | ICD-10-CM | POA: Diagnosis not present

## 2020-05-09 DIAGNOSIS — W19XXXD Unspecified fall, subsequent encounter: Secondary | ICD-10-CM

## 2020-05-09 NOTE — Patient Instructions (Addendum)
GO TO THE FRONT DESK, PLEASE SCHEDULE YOUR APPOINTMENTS Come back for a check up in 3 months    Fall Prevention in the Home, Adult Falls can cause injuries and can affect people from all age groups. There are many simple things that you can do to make your home safe and to help prevent falls. Ask for help when making these changes, if needed. What actions can I take to prevent falls? General instructions  Use good lighting in all rooms. Replace any light bulbs that burn out.  Turn on lights if it is dark. Use night-lights.  Place frequently used items in easy-to-reach places. Lower the shelves around your home if necessary.  Set up furniture so that there are clear paths around it. Avoid moving your furniture around.  Remove throw rugs and other tripping hazards from the floor.  Avoid walking on wet floors.  Fix any uneven floor surfaces.  Add color or contrast paint or tape to grab bars and handrails in your home. Place contrasting color strips on the first and last steps of stairways.  When you use a stepladder, make sure that it is completely opened and that the sides are firmly locked. Have someone hold the ladder while you are using it. Do not climb a closed stepladder.  Be aware of any and all pets. What can I do in the bathroom?      Keep the floor dry. Immediately clean up any water that spills onto the floor.  Remove soap buildup in the tub or shower on a regular basis.  Use non-skid mats or decals on the floor of the tub or shower.  Attach bath mats securely with double-sided, non-slip rug tape.  If you need to sit down while you are in the shower, use a plastic, non-slip stool.  Install grab bars by the toilet and in the tub and shower. Do not use towel bars as grab bars. What can I do in the bedroom?  Make sure that a bedside light is easy to reach.  Do not use oversized bedding that drapes onto the floor.  Have a firm chair that has side arms to use for  getting dressed. What can I do in the kitchen?  Clean up any spills right away.  If you need to reach for something above you, use a sturdy step stool that has a grab bar.  Keep electrical cables out of the way.  Do not use floor polish or wax that makes floors slippery. If you must use wax, make sure that it is non-skid floor wax. What can I do in the stairways?  Do not leave any items on the stairs.  Make sure that you have a light switch at the top of the stairs and the bottom of the stairs. Have them installed if you do not have them.  Make sure that there are handrails on both sides of the stairs. Fix handrails that are broken or loose. Make sure that handrails are as long as the stairways.  Install non-slip stair treads on all stairs in your home.  Avoid having throw rugs at the top or bottom of stairways, or secure the rugs with carpet tape to prevent them from moving.  Choose a carpet design that does not hide the edge of steps on the stairway.  Check any carpeting to make sure that it is firmly attached to the stairs. Fix any carpet that is loose or worn. What can I do on the outside of  my home?  Use bright outdoor lighting.  Regularly repair the edges of walkways and driveways and fix any cracks.  Remove high doorway thresholds.  Trim any shrubbery on the main path into your home.  Regularly check that handrails are securely fastened and in good repair. Both sides of any steps should have handrails.  Install guardrails along the edges of any raised decks or porches.  Clear walkways of debris and clutter, including tools and rocks.  Have leaves, snow, and ice cleared regularly.  Use sand or salt on walkways during winter months.  In the garage, clean up any spills right away, including grease or oil spills. What other actions can I take?  Wear closed-toe shoes that fit well and support your feet. Wear shoes that have rubber soles or low heels.  Use mobility  aids as needed, such as canes, walkers, scooters, and crutches.  Review your medicines with your health care provider. Some medicines can cause dizziness or changes in blood pressure, which increase your risk of falling. Talk with your health care provider about other ways that you can decrease your risk of falls. This may include working with a physical therapist or trainer to improve your strength, balance, and endurance. Where to find more information  Centers for Disease Control and Prevention, STEADI: WebmailGuide.co.za  [employer] on Aging: BrainJudge.co.uk Contact a health care provider if:  You are afraid of falling at home.  You feel weak, drowsy, or dizzy at home.  You fall at home. Summary  There are many simple things that you can do to make your home safe and to help prevent falls.  Ways to make your home safe include removing tripping hazards and installing grab bars in the bathroom.  Ask for help when making these changes in your home. This information is not intended to replace advice given to you by your health care provider. Make sure you discuss any questions you have with your health care provider. Document Revised: 10/14/2017 Document Reviewed: 06/16/2017 Elsevier Patient Education  2020 [employer].

## 2020-05-09 NOTE — Progress Notes (Signed)
Pre visit review using our clinic review tool, if applicable. No additional management support is needed unless otherwise documented below in the visit note. 

## 2020-05-09 NOTE — Progress Notes (Signed)
Subjective:    Patient ID: Ryan Zhang, male    DOB: 06/24/1946, 74 y.o.   MRN: [id number]  DOS:  05/09/2020 Type of visit - description: ER follow-up Went to the ER 04/26/2020 after a fall.  One of his ankles gave up, he fell backwards, hit his head. Work-up: CT head no acute CT cervical spine no acute X-ray of shoulders & thumbs: No acute Chest x-ray cardiomegaly  x-ray pelvis negative  Review of Systems Since the ER visit he is feeling fine. This was the first major fall, no further falls. Denies ankle pain, neck pain, hip pain. He takes multiple medications, denies excessive sedation No chest pain, palpitations No headache, nausea or vomiting  Past Medical History:  Diagnosis Date  . Allergy    allergy shots Dr. Velora Heckler  . Anemia   . Anxiety   . Asthma    moderate persistant  . Bipolar affective ([place])   . Cataract    both eyes  . COPD (chronic obstructive pulmonary disease) ([place])   . Depression   . Diabetes mellitus   . DJD (degenerative joint disease)   . Dysphagia   . Gallstones    hx of, s/p cholecystectomy  . GERD (gastroesophageal reflux disease)   . Hepatitis A    as teenager 53's  . Hollenhorst plaque    right eye  . Hyperlipidemia   . Hypertension   . Kidney stones    hx of  . Meniere's disease   . Motility disorder, esophageal   . Obesity   . Pancreatitis   . Personal history of colonic polyps 11/2004   hyperplastic.  . Stroke [facility])    per MRI    Past Surgical History:  Procedure Laterality Date  . CATARACT EXTRACTION Bilateral 09/2015 and 10/2015  . CHOLECYSTECTOMY    . COLONOSCOPY    . TOTAL KNEE ARTHROPLASTY Bilateral    both knees    Allergies as of 05/09/2020      Reactions   Celexa  [citalopram Hydrobromide] Other (See Comments)   Depakote  [divalproex Sodium] Other (See Comments)   Methocarbamol    Other reaction(s): Hallucinations   Paroxetine Hcl    Other reaction(s): Insomnia   Smz-tmp Ds  [sulfamethoxazole-trimethoprim] Nausea And Vomiting   Divalproex Sodium Other (See Comments)   Pancreatitis    Methocarbamol Other (See Comments)   hallucinations   Other    "Symbrax" alteration in blood sugar, pt unsure if low or high blood sugar   Paroxetine Other (See Comments)   Insomnia   Percocet [oxycodone-acetaminophen] Other (See Comments)   Hyperactivity   Risperidone Other (See Comments)   REACTION: hyper   Wellbutrin [bupropion] Other (See Comments)   Hot flashes   Citalopram Hydrobromide Other (See Comments)   Insomnia      Medication List       Accurate as of May 09, 2020 11:59 PM. If you have any questions, ask your nurse or doctor.        acetaminophen 325 MG tablet Commonly known as: TYLENOL Take 2 tablets (650 mg total) by mouth every 6 (six) hours as needed (mild pain; headache).   albuterol (2.5 MG/3ML) 0.083% nebulizer solution Commonly known as: PROVENTIL Take 3 mLs (2.5 mg total) by nebulization every 6 (six) hours as needed for wheezing or shortness of breath.   albuterol 108 (90 Base) MCG/ACT inhaler Commonly known as: VENTOLIN HFA Inhale 2 puffs into the lungs every 6 (six) hours as needed for wheezing or  shortness of breath.   augmented betamethasone dipropionate 0.05 % ointment Commonly known as: DIPROLENE-AF Apply topically 2 (two) times daily as needed.   azelastine 0.1 % nasal spray Commonly known as: ASTELIN Place 1 spray into both nostrils as needed for rhinitis. To replace Flonase   budesonide-formoterol 160-4.5 MCG/ACT inhaler Commonly known as: SYMBICORT Inhale 2 puffs into the lungs 2 (two) times daily.   clonazePAM 0.5 MG tablet Commonly known as: KLONOPIN Take 0.5 mg by mouth See admin instructions. #2 at 8am, #1 at 2pm [facility])   clopidogrel 75 MG tablet Commonly known as: PLAVIX Take 1 tablet (75 mg total) by mouth daily.   Cymbalta 60 MG capsule Generic drug: DULoxetine Take 60 mg by mouth every morning.     EpiPen 2-Pak 0.3 mg/0.3 mL Soaj injection Generic drug: EPINEPHrine Reported on 04/29/2016   esomeprazole 40 MG capsule Commonly known as: NEXIUM Take 40 mg by mouth every morning.   fexofenadine 180 MG tablet Commonly known as: ALLEGRA Take 180 mg by mouth every morning. 8am [name])   ketoconazole 2 % cream Commonly known as: NIZORAL apply to the affected topical area once daily What changed: See the new instructions.   lamoTRIgine 25 MG tablet Commonly known as: LAMICTAL Take 50 mg by mouth at bedtime.   losartan 25 MG tablet Commonly known as: COZAAR TAKE 1 TABLET BY MOUTH EVERY EVENING What changed: when to take this   montelukast 10 MG tablet Commonly known as: SINGULAIR Take 10 mg by mouth at bedtime. [name]   multivitamin tablet Take 1 tablet by mouth daily.   onetouch ultrasoft lancets Check blood sugars no more than twice daily   OneTouch Verio test strip Generic drug: glucose blood use to check blood sugar no more than twice daily   pioglitazone-metformin 15-850 MG tablet Commonly known as: ACTOPLUS MET TAKE ONE TABLET BY MOUTH TWICE DAILY WITH MEALS   Saphris 5 MG Subl 24 hr tablet Generic drug: asenapine Place 5 mg under the tongue at bedtime.   simvastatin 40 MG tablet Commonly known as: ZOCOR Take 1 tablet (40 mg total) by mouth at bedtime.   solifenacin 10 MG tablet Commonly known as: VESICARE Take 10 mg by mouth daily.   tamsulosin 0.4 MG Caps capsule Commonly known as: FLOMAX Take 1 capsule (0.4 mg total) by mouth daily after supper.   traZODone 50 MG tablet Commonly known as: DESYREL Take 50 mg by mouth at bedtime as needed for sleep. [name]   Veramyst 27.5 MCG/SPRAY nasal spray Generic drug: fluticasone Place 2 sprays into the nose daily as needed for rhinitis or allergies.          Objective:   Physical Exam BP 123/73 (BP Location: Left Arm, Patient Position: Sitting, Cuff Size: Normal)   Pulse 78   Temp (!)  96.3 F (35.7 C) (Temporal)   Resp 18   Ht 5' 9"  (1.753 m)   Wt 250 lb 2 oz (113.5 kg)   SpO2 96%   BMI 36.94 kg/m  General:   Well developed, NAD, BMI noted. HEENT:  Normocephalic . Face symmetric, atraumatic Neck: No TTP, range of motion essentially normal Lungs:  CTA B Normal respiratory effort, no intercostal retractions, no accessory muscle use. Heart: RRR,  no murmur.  Lower extremities: no pretibial edema bilaterally.  Ankles: Symmetric, range of motion normal Skin: Not pale. Not jaundice Neurologic:  alert & oriented X3.  Speech normal, gait appropriate for age and unassisted Psych--  Cognition and  judgment appear intact.  Cooperative with normal attention span and concentration.  Behavior appropriate. No anxious or depressed appearing.      Assessment      Assessment DM HTN Hyperlipidemia Morbid obesity (BMI 39 plus DM) Bipolar, Depression ------ see psychiatry, Dr Casimiro Needle DJD-- hydrocodone rarely rx by [place] ortho Asthma-Allergies ------------ Dr [name]  GERD, h/o dysphagia (chronic, neurogenic-transfer dysphagia? See GI note 12-2011) Neuro: --? stroke : saw neuro 2013 d/t B transient visual loss, ASA changed to plavix.  --See CTA, MRIs of head-neck report  --Saw neuro 11-2013: fall, syncope,parkinsonism d/t sapharis? CV: Enlarged ascending Ao  --per CT 12-2015,CT chest 08-2016 stable.    --MRI chest 10/2017 stable, no further routine x-rays recommended --CT chest 07/2018: Atherosclerotic changes in the thoracic aorta.  Nl LE arterial dopplers 2015 Carotid artery disease: Per ultrasound 2011, last [place] 2018: <50% B, rx medical treatment, recheck 08/2019 < 1-39% B, stable 2 years  CT chest  ---RML  77m: 07-2014, CT 12-2015, CT 08-2016, MRI chest 12,2018, CT chest 07/2018:Stable  HOH H/o Mnire's disease  Iron deficiency anemia: Noted 01/2018, + Hemoccult, 09-2018: Colonoscopy and EGD were completely normal   PLAN: Fall: Mechanical, as described above,  fortunately with no major consequences, multiple x-rays and CTs and emergency room negative, currently asymptomatic. Strongly recommend fall prevention, declined PT referral, has a cane that he uses from time to time. He is moving to new place, will be an apartment, first floor. HTN, DM, hyperlipidemia: Seems stable, same meds, RTC 3 months RTC 3 months  This visit occurred during the SARS-CoV-2 public health emergency.  Safety protocols were in place, including screening questions prior to the visit, additional usage of staff PPE, and extensive cleaning of exam room while observing appropriate contact time as indicated for disinfecting solutions.

## 2020-05-10 NOTE — Assessment & Plan Note (Signed)
Fall: Mechanical, as described above, fortunately with no major consequences, multiple x-rays and CTs and emergency room negative, currently asymptomatic. Strongly recommend fall prevention, declined PT referral, has a cane that he uses from time to time. He is moving to new place, will be an apartment, first floor. HTN, DM, hyperlipidemia: Seems stable, same meds, RTC 3 months RTC 3 months

## 2020-05-14 DIAGNOSIS — J3089 Other allergic rhinitis: Secondary | ICD-10-CM | POA: Diagnosis not present

## 2020-05-14 DIAGNOSIS — J301 Allergic rhinitis due to pollen: Secondary | ICD-10-CM | POA: Diagnosis not present

## 2020-05-15 ENCOUNTER — Ambulatory Visit: Payer: Medicare Other | Admitting: Podiatry

## 2020-05-15 ENCOUNTER — Encounter: Payer: Self-pay | Admitting: Podiatry

## 2020-05-15 ENCOUNTER — Ambulatory Visit (INDEPENDENT_AMBULATORY_CARE_PROVIDER_SITE_OTHER): Payer: Medicare Other

## 2020-05-15 ENCOUNTER — Other Ambulatory Visit: Payer: Self-pay

## 2020-05-15 DIAGNOSIS — S90852A Superficial foreign body, left foot, initial encounter: Secondary | ICD-10-CM

## 2020-05-15 NOTE — Progress Notes (Signed)
Subjective:  Patient ID: Ryan Zhang, male    DOB: 03/01/1946,  MRN: [id number] HPI Chief Complaint  Patient presents with  . Foot Injury    Plantar/lateral foot left - thinks he may have stepped on a piece of glass last Tues (9 days ago), started to get tender past couple days, in the process of moving   . New Patient (Initial Visit)    Est pt 04/2017    74 y.o. male presents with the above complaint.   ROS: Denies fever chills nausea vomiting muscle aches pains calf pain back pain chest pain shortness of breath.  Past Medical History:  Diagnosis Date  . Allergy    allergy shots Dr. Velora Heckler  . Anemia   . Anxiety   . Asthma    moderate persistant  . Bipolar affective ([place])   . Cataract    both eyes  . COPD (chronic obstructive pulmonary disease) ([place])   . Depression   . Diabetes mellitus   . DJD (degenerative joint disease)   . Dysphagia   . Gallstones    hx of, s/p cholecystectomy  . GERD (gastroesophageal reflux disease)   . Hepatitis A    as teenager 90's  . Hollenhorst plaque    right eye  . Hyperlipidemia   . Hypertension   . Kidney stones    hx of  . Meniere's disease   . Motility disorder, esophageal   . Obesity   . Pancreatitis   . Personal history of colonic polyps 11/2004   hyperplastic.  . Stroke [facility])    per MRI   Past Surgical History:  Procedure Laterality Date  . CATARACT EXTRACTION Bilateral 09/2015 and 10/2015  . CHOLECYSTECTOMY    . COLONOSCOPY    . TOTAL KNEE ARTHROPLASTY Bilateral    both knees    Current Outpatient Medications:  .  acetaminophen (TYLENOL) 325 MG tablet, Take 2 tablets (650 mg total) by mouth every 6 (six) hours as needed (mild pain; headache)., Disp:  , Rfl:  .  albuterol (PROVENTIL) (2.5 MG/3ML) 0.083% nebulizer solution, Take 3 mLs (2.5 mg total) by nebulization every 6 (six) hours as needed for wheezing or shortness of breath., Disp: 180 mL, Rfl: 1 .  albuterol (VENTOLIN HFA) 108 (90 Base) MCG/ACT inhaler, Inhale  2 puffs into the lungs every 6 (six) hours as needed for wheezing or shortness of breath., Disp: 18 g, Rfl: 6 .  asenapine (SAPHRIS) 5 MG SUBL, Place 5 mg under the tongue at bedtime. , Disp: , Rfl:  .  augmented betamethasone dipropionate (DIPROLENE-AF) 0.05 % ointment, Apply topically 2 (two) times daily as needed., Disp: 45 g, Rfl: 2 .  azelastine (ASTELIN) 0.1 % nasal spray, Place 1 spray into both nostrils as needed for rhinitis. To replace Flonase, Disp: , Rfl:  .  budesonide-formoterol (SYMBICORT) 160-4.5 MCG/ACT inhaler, Inhale 2 puffs into the lungs 2 (two) times daily. , Disp: , Rfl:  .  clonazePAM (KLONOPIN) 0.5 MG tablet, Take 0.5 mg by mouth See admin instructions. #2 at 8am, #1 at 2pm [facility]), Disp: , Rfl:  .  clopidogrel (PLAVIX) 75 MG tablet, Take 1 tablet (75 mg total) by mouth daily., Disp: 90 tablet, Rfl: 3 .  DULoxetine (CYMBALTA) 60 MG capsule, Take 60 mg by mouth every morning. , Disp: , Rfl:  .  EPIPEN 2-PAK 0.3 MG/0.3ML SOAJ injection, Reported on 04/29/2016, Disp: , Rfl:  .  esomeprazole (NEXIUM) 40 MG capsule, Take 40 mg by mouth every morning. ,  Disp: , Rfl:  .  fexofenadine (ALLEGRA) 180 MG tablet, Take 180 mg by mouth every morning. 8am [name]), Disp: , Rfl:  .  fluticasone (VERAMYST) 27.5 MCG/SPRAY nasal spray, Place 2 sprays into the nose daily as needed for rhinitis or allergies. , Disp: , Rfl:  .  ketoconazole (NIZORAL) 2 % cream, apply to the affected topical area once daily (Patient taking differently: Apply 1 application topically daily. ), Disp: 60 g, Rfl: 0 .  lamoTRIgine (LAMICTAL) 25 MG tablet, Take 50 mg by mouth at bedtime., Disp: , Rfl:  .  Lancets (ONETOUCH ULTRASOFT) lancets, Check blood sugars no more than twice daily, Disp: 100 each, Rfl: 12 .  losartan (COZAAR) 25 MG tablet, TAKE 1 TABLET BY MOUTH EVERY EVENING  (Patient taking differently: Take 25 mg by mouth daily. ), Disp: 90 tablet, Rfl: 1 .  montelukast (SINGULAIR) 10 MG tablet, Take 10 mg by  mouth at bedtime. [name], Disp: , Rfl:  .  Multiple Vitamin (MULTIVITAMIN) tablet, Take 1 tablet by mouth daily. , Disp: , Rfl:  .  ONETOUCH VERIO test strip, use to check blood sugar no more than twice daily, Disp: 100 each, Rfl: 12 .  pioglitazone-metformin (ACTOPLUS MET) 15-850 MG tablet, TAKE ONE TABLET BY MOUTH TWICE DAILY WITH MEALS (Patient taking differently: Take 1 tablet by mouth 2 (two) times daily with a meal. ), Disp: 180 tablet, Rfl: 1 .  simvastatin (ZOCOR) 40 MG tablet, Take 1 tablet (40 mg total) by mouth at bedtime., Disp: 90 tablet, Rfl: 3 .  solifenacin (VESICARE) 10 MG tablet, Take 10 mg by mouth daily., Disp: , Rfl:  .  tamsulosin (FLOMAX) 0.4 MG CAPS capsule, Take 1 capsule (0.4 mg total) by mouth daily after supper., Disp: 90 capsule, Rfl: 3 .  traZODone (DESYREL) 50 MG tablet, Take 50 mg by mouth at bedtime as needed for sleep. [name], Disp: , Rfl:   Allergies  Allergen Reactions  . Celexa  [Citalopram Hydrobromide] Other (See Comments)  . Depakote  [Divalproex Sodium] Other (See Comments)  . Methocarbamol     Other reaction(s): Hallucinations  . Paroxetine Hcl     Other reaction(s): Insomnia  . Smz-Tmp Ds [Sulfamethoxazole-Trimethoprim] Nausea And Vomiting  . Divalproex Sodium Other (See Comments)    Pancreatitis   . Methocarbamol Other (See Comments)    hallucinations  . Other     "Symbrax" alteration in blood sugar, pt unsure if low or high blood sugar  . Paroxetine Other (See Comments)    Insomnia  . Percocet [Oxycodone-Acetaminophen] Other (See Comments)    Hyperactivity  . Risperidone Other (See Comments)    REACTION: hyper  . Wellbutrin [Bupropion] Other (See Comments)    Hot flashes  . Citalopram Hydrobromide Other (See Comments)    Insomnia   Review of Systems Objective:  There were no vitals filed for this visit.  General: Well developed, nourished, in no acute distress, alert and oriented x3   Dermatological: Skin is warm, dry and  supple bilateral. Nails x 10 are well maintained; remaining integument appears unremarkable at this time. There are no open sores, no preulcerative lesions, no rash or signs of infection present.  Vascular: Dorsalis Pedis artery and Posterior Tibial artery pedal pulses are 2/4 bilateral with immedate capillary fill time. Pedal hair growth present. No varicosities and no lower extremity edema present bilateral.   Neruologic: Grossly intact via light touch bilateral. Vibratory intact via tuning fork bilateral. Protective threshold with Semmes Wienstein monofilament intact  to all pedal sites bilateral. Patellar and Achilles deep tendon reflexes 2+ bilateral. No Babinski or clonus noted bilateral.   Musculoskeletal: No gross boney pedal deformities bilateral. No pain, crepitus, or limitation noted with foot and ankle range of motion bilateral. Muscular strength 5/5 in all groups tested bilateral.  Gait: Unassisted, Nonantalgic.    Radiographs:  Radiographs taken today with a marker demonstrate a small foreign body appears to be glass or metallic.  Lateral aspect of the foot beneath the fifth metatarsal.  Assessment & Plan:   Assessment: Small spicule of glass foreign body no infection plantar aspect left foot.  Plan: Removal foreign body today without local anesthetic prepped it with Betadine debrided the skin remove the foreign body which was a small spicule of glass measuring half a millimeter in diameter and 1/2 mm in length.  No purulence no malodor redressed with a dry sterile dressing leave until tomorrow soak Epson salt and warm water to completely heal.  Signs and symptoms of infection notify us immediately.     Usha Slager T. [place], [place]

## 2020-05-17 ENCOUNTER — Other Ambulatory Visit: Payer: Self-pay | Admitting: Internal Medicine

## 2020-05-21 DIAGNOSIS — J301 Allergic rhinitis due to pollen: Secondary | ICD-10-CM | POA: Diagnosis not present

## 2020-05-21 DIAGNOSIS — J3089 Other allergic rhinitis: Secondary | ICD-10-CM | POA: Diagnosis not present

## 2020-05-28 DIAGNOSIS — J301 Allergic rhinitis due to pollen: Secondary | ICD-10-CM | POA: Diagnosis not present

## 2020-05-28 DIAGNOSIS — J3089 Other allergic rhinitis: Secondary | ICD-10-CM | POA: Diagnosis not present

## 2020-05-30 DIAGNOSIS — J301 Allergic rhinitis due to pollen: Secondary | ICD-10-CM | POA: Diagnosis not present

## 2020-05-30 DIAGNOSIS — J3089 Other allergic rhinitis: Secondary | ICD-10-CM | POA: Diagnosis not present

## 2020-06-04 ENCOUNTER — Other Ambulatory Visit: Payer: Self-pay

## 2020-06-04 ENCOUNTER — Encounter: Payer: Self-pay | Admitting: Internal Medicine

## 2020-06-04 ENCOUNTER — Ambulatory Visit (INDEPENDENT_AMBULATORY_CARE_PROVIDER_SITE_OTHER): Payer: Medicare Other | Admitting: Internal Medicine

## 2020-06-04 VITALS — BP 143/83 | HR 89 | Temp 98.4°F | Resp 18 | Ht 69.0 in | Wt 245.4 lb

## 2020-06-04 DIAGNOSIS — L03119 Cellulitis of unspecified part of limb: Secondary | ICD-10-CM | POA: Diagnosis not present

## 2020-06-04 MED ORDER — SANTYL 250 UNIT/GM EX OINT: 1.0000 "application " | TOPICAL_OINTMENT | Freq: Every day | CUTANEOUS | 0 refills | Status: DC

## 2020-06-04 MED ORDER — CEPHALEXIN 500 MG PO CAPS: 500.0000 mg | ORAL_CAPSULE | Freq: Four times a day (QID) | ORAL | 0 refills | Status: DC

## 2020-06-04 MED FILL — CEPHALEXIN 500 MG CAPSULE: 500 | 7 days supply | Qty: 28 | Fill #0

## 2020-06-04 NOTE — Progress Notes (Signed)
Pre visit review using our clinic review tool, if applicable. No additional management support is needed unless otherwise documented below in the visit note. 

## 2020-06-04 NOTE — Progress Notes (Signed)
Subjective:    Patient ID: Ryan Zhang, male    DOB: 10-28-1946, 74 y.o.   MRN: [id number]  DOS:  06/04/2020 Type of visit - description: Acute 2 days ago noted redness at the right hand, wife thinks is cellulitis. Denies any injury or insect bite that he can recall. No discharge noted.  Also 4 weeks ago injury the right pretibial area, has a wound that has not healed but has improved some by using peroxide.   Review of Systems See above   Past Medical History:  Diagnosis Date  . Allergy    allergy shots Dr. Velora Heckler  . Anemia   . Anxiety   . Asthma    moderate persistant  . Bipolar affective ([place])   . Cataract    both eyes  . COPD (chronic obstructive pulmonary disease) ([place])   . Depression   . Diabetes mellitus   . DJD (degenerative joint disease)   . Dysphagia   . Gallstones    hx of, s/p cholecystectomy  . GERD (gastroesophageal reflux disease)   . Hepatitis A    as teenager 37's  . Hollenhorst plaque    right eye  . Hyperlipidemia   . Hypertension   . Kidney stones    hx of  . Meniere's disease   . Motility disorder, esophageal   . Obesity   . Pancreatitis   . Personal history of colonic polyps 11/2004   hyperplastic.  . Stroke [facility])    per MRI    Past Surgical History:  Procedure Laterality Date  . CATARACT EXTRACTION Bilateral 09/2015 and 10/2015  . CHOLECYSTECTOMY    . COLONOSCOPY    . TOTAL KNEE ARTHROPLASTY Bilateral    both knees    Allergies as of 06/04/2020      Reactions   Celexa  [citalopram Hydrobromide] Other (See Comments)   Depakote  [divalproex Sodium] Other (See Comments)   Methocarbamol    Other reaction(s): Hallucinations   Paroxetine Hcl    Other reaction(s): Insomnia   Smz-tmp Ds [sulfamethoxazole-trimethoprim] Nausea And Vomiting   Other    "Symbrax" alteration in blood sugar, pt unsure if low or high blood sugar   Percocet [oxycodone-acetaminophen] Other (See Comments)   Hyperactivity   Risperidone Other (See  Comments)   REACTION: hyper   Wellbutrin [bupropion] Other (See Comments)   Hot flashes      Medication List       Accurate as of June 04, 2020 11:59 PM. If you have any questions, ask your nurse or doctor.        acetaminophen 325 MG tablet Commonly known as: TYLENOL Take 2 tablets (650 mg total) by mouth every 6 (six) hours as needed (mild pain; headache).   albuterol (2.5 MG/3ML) 0.083% nebulizer solution Commonly known as: PROVENTIL Take 3 mLs (2.5 mg total) by nebulization every 6 (six) hours as needed for wheezing or shortness of breath.   albuterol 108 (90 Base) MCG/ACT inhaler Commonly known as: VENTOLIN HFA Inhale 2 puffs into the lungs every 6 (six) hours as needed for wheezing or shortness of breath.   augmented betamethasone dipropionate 0.05 % ointment Commonly known as: DIPROLENE-AF Apply topically 2 (two) times daily as needed.   azelastine 0.1 % nasal spray Commonly known as: ASTELIN Place 1 spray into both nostrils as needed for rhinitis. To replace Flonase   budesonide-formoterol 160-4.5 MCG/ACT inhaler Commonly known as: SYMBICORT Inhale 2 puffs into the lungs 2 (two) times daily.   cephALEXin 500  MG capsule Commonly known as: KEFLEX Take 1 capsule (500 mg total) by mouth 4 (four) times daily. Started by: Kathlene November, MD   clonazePAM 0.5 MG tablet Commonly known as: KLONOPIN Take 0.5 mg by mouth See admin instructions. #2 at 8am, #1 at 2pm [facility])   clopidogrel 75 MG tablet Commonly known as: PLAVIX Take 1 tablet (75 mg total) by mouth daily.   Cymbalta 60 MG capsule Generic drug: DULoxetine Take 60 mg by mouth every morning.   EpiPen 2-Pak 0.3 mg/0.3 mL Soaj injection Generic drug: EPINEPHrine Reported on 04/29/2016   esomeprazole 40 MG capsule Commonly known as: NEXIUM Take 40 mg by mouth every morning.   fexofenadine 180 MG tablet Commonly known as: ALLEGRA Take 180 mg by mouth every morning. 8am [name])   ketoconazole 2 %  cream Commonly known as: NIZORAL apply to the affected topical area once daily What changed: See the new instructions.   lamoTRIgine 25 MG tablet Commonly known as: LAMICTAL Take 50 mg by mouth at bedtime.   losartan 25 MG tablet Commonly known as: COZAAR TAKE 1 TABLET BY MOUTH EVERY EVENING What changed: when to take this   montelukast 10 MG tablet Commonly known as: SINGULAIR Take 10 mg by mouth at bedtime. [name]   multivitamin tablet Take 1 tablet by mouth daily.   onetouch ultrasoft lancets Check blood sugars no more than twice daily   OneTouch Verio test strip Generic drug: glucose blood use to check blood sugar no more than twice daily   pioglitazone-metformin 15-850 MG tablet Commonly known as: ACTOPLUS MET Take 1 tablet by mouth 2 (two) times daily with a meal.   Santyl ointment Generic drug: collagenase Apply 1 application topically daily. Started by: Kathlene November, MD   Saphris 5 MG Subl 24 hr tablet Generic drug: asenapine Place 5 mg under the tongue at bedtime.   simvastatin 40 MG tablet Commonly known as: ZOCOR Take 1 tablet (40 mg total) by mouth at bedtime.   solifenacin 10 MG tablet Commonly known as: VESICARE Take 10 mg by mouth daily.   tamsulosin 0.4 MG Caps capsule Commonly known as: FLOMAX Take 1 capsule (0.4 mg total) by mouth daily after supper.   traZODone 50 MG tablet Commonly known as: DESYREL Take 50 mg by mouth at bedtime as needed for sleep. [name]   Veramyst 27.5 MCG/SPRAY nasal spray Generic drug: fluticasone Place 2 sprays into the nose daily as needed for rhinitis or allergies.          Objective:   Physical Exam Skin:        BP (!) 143/83 (BP Location: Left Arm, Patient Position: Sitting, Cuff Size: Normal)   Pulse 89   Temp 98.4 F (36.9 C) (Oral)   Resp 18   Ht 5' 9"  (1.753 m)   Wt 245 lb 6 oz (111.3 kg)   SpO2 96%   BMI 36.24 kg/m  General:   Well developed, NAD, BMI noted. HEENT:  Normocephalic  . Face symmetric, atraumatic Skin: See graphic  neurologic:  alert & oriented X3.  Speech normal, gait appropriate for age and unassisted Psych--  Cognition and judgment appear intact.  Cooperative with normal attention span and concentration.  Behavior appropriate. No anxious or depressed appearing.      Assessment     Assessment DM HTN Hyperlipidemia Morbid obesity (BMI 39 plus DM) Bipolar, Depression ------ see psychiatry, Dr Casimiro Needle DJD-- hydrocodone rarely rx by [place] ortho Asthma-Allergies ------------ Dr [name]  GERD, h/o dysphagia (chronic, neurogenic-transfer dysphagia? See GI note 12-2011) Neuro: --? stroke : saw neuro 2013 d/t B transient visual loss, ASA changed to plavix.  --See CTA, MRIs of head-neck report  --Saw neuro 11-2013: fall, syncope,parkinsonism d/t sapharis? CV: Enlarged ascending Ao  --per CT 12-2015,CT chest 08-2016 stable.    --MRI chest 10/2017 stable, no further routine x-rays recommended --CT chest 07/2018: Atherosclerotic changes in the thoracic aorta.  Nl LE arterial dopplers 2015 Carotid artery disease: Per ultrasound 2011, last [place] 2018: <50% B, rx medical treatment, recheck 08/2019 < 1-39% B, stable 2 years  CT chest  ---RML  55m: 07-2014, CT 12-2015, CT 08-2016, MRI chest 12,2018, CT chest 07/2018:Stable  HOH H/o Mnire's disease  Iron deficiency anemia: Noted 01/2018, + Hemoccult, 09-2018: Colonoscopy and EGD were completely normal   PLAN: Cellulitis, hand: As described above, unclear etiology, no cut-injury or insect bite that he recalls. Plan: Keflex for 1 week, call if not better Abrasion, right shin: Apply Santyl daily, cover it only if he is outdoors.  If not healing in the next 3 or 4 weeks he will let me know for a  wound care center referral.    This visit occurred during the SARS-CoV-2 public health emergency.  Safety protocols were in place, including screening questions prior to the visit, additional usage of staff PPE, and  extensive cleaning of exam room while observing appropriate contact time as indicated for disinfecting solutions.

## 2020-06-04 NOTE — Patient Instructions (Signed)
Please send me pictures of the infection and abrasion  For the hand: Take Keflex as recommended for 1 week. Also apply over-the-counter antibiotic ointment. Call if not better in the next few days or if you get worse   For the leg:  Apply Santyl, a ointment daily, keep it covered only if you go outside.

## 2020-06-05 NOTE — Assessment & Plan Note (Signed)
Cellulitis, hand: As described above, unclear etiology, no cut-injury or insect bite that he recalls. Plan: Keflex for 1 week, call if not better Abrasion, right shin: Apply Santyl daily, cover it only if he is outdoors.  If not healing in the next 3 or 4 weeks he will let me know for a  wound care center referral.

## 2020-06-09 ENCOUNTER — Other Ambulatory Visit: Payer: Self-pay | Admitting: Internal Medicine

## 2020-06-10 ENCOUNTER — Encounter: Payer: Self-pay | Admitting: Internal Medicine

## 2020-06-12 DIAGNOSIS — E119 Type 2 diabetes mellitus without complications: Secondary | ICD-10-CM | POA: Diagnosis not present

## 2020-06-12 DIAGNOSIS — Z961 Presence of intraocular lens: Secondary | ICD-10-CM | POA: Diagnosis not present

## 2020-06-12 DIAGNOSIS — H35371 Puckering of macula, right eye: Secondary | ICD-10-CM | POA: Diagnosis not present

## 2020-06-12 DIAGNOSIS — H35033 Hypertensive retinopathy, bilateral: Secondary | ICD-10-CM | POA: Diagnosis not present

## 2020-06-12 LAB — HM DIABETES EYE EXAM

## 2020-06-18 DIAGNOSIS — J3089 Other allergic rhinitis: Secondary | ICD-10-CM | POA: Diagnosis not present

## 2020-06-18 DIAGNOSIS — J301 Allergic rhinitis due to pollen: Secondary | ICD-10-CM | POA: Diagnosis not present

## 2020-06-25 DIAGNOSIS — J301 Allergic rhinitis due to pollen: Secondary | ICD-10-CM | POA: Diagnosis not present

## 2020-06-25 DIAGNOSIS — J3089 Other allergic rhinitis: Secondary | ICD-10-CM | POA: Diagnosis not present

## 2020-07-02 DIAGNOSIS — J3089 Other allergic rhinitis: Secondary | ICD-10-CM | POA: Diagnosis not present

## 2020-07-02 DIAGNOSIS — J301 Allergic rhinitis due to pollen: Secondary | ICD-10-CM | POA: Diagnosis not present

## 2020-07-09 DIAGNOSIS — J301 Allergic rhinitis due to pollen: Secondary | ICD-10-CM | POA: Diagnosis not present

## 2020-07-09 DIAGNOSIS — J3089 Other allergic rhinitis: Secondary | ICD-10-CM | POA: Diagnosis not present

## 2020-07-14 DIAGNOSIS — J3089 Other allergic rhinitis: Secondary | ICD-10-CM | POA: Diagnosis not present

## 2020-07-14 DIAGNOSIS — J301 Allergic rhinitis due to pollen: Secondary | ICD-10-CM | POA: Diagnosis not present

## 2020-07-16 DIAGNOSIS — J301 Allergic rhinitis due to pollen: Secondary | ICD-10-CM | POA: Diagnosis not present

## 2020-07-16 DIAGNOSIS — J3089 Other allergic rhinitis: Secondary | ICD-10-CM | POA: Diagnosis not present

## 2020-07-22 DIAGNOSIS — J301 Allergic rhinitis due to pollen: Secondary | ICD-10-CM | POA: Diagnosis not present

## 2020-07-22 DIAGNOSIS — J3089 Other allergic rhinitis: Secondary | ICD-10-CM | POA: Diagnosis not present

## 2020-07-24 ENCOUNTER — Other Ambulatory Visit: Payer: Self-pay

## 2020-07-24 ENCOUNTER — Telehealth: Payer: Self-pay | Admitting: Internal Medicine

## 2020-07-24 ENCOUNTER — Encounter: Payer: Self-pay | Admitting: Nurse Practitioner

## 2020-07-24 ENCOUNTER — Telehealth (INDEPENDENT_AMBULATORY_CARE_PROVIDER_SITE_OTHER): Payer: Medicare Other | Admitting: Nurse Practitioner

## 2020-07-24 VITALS — BP 143/71 | HR 98 | Temp 97.2°F

## 2020-07-24 DIAGNOSIS — L03114 Cellulitis of left upper limb: Secondary | ICD-10-CM | POA: Diagnosis not present

## 2020-07-24 DIAGNOSIS — J4541 Moderate persistent asthma with (acute) exacerbation: Secondary | ICD-10-CM | POA: Diagnosis not present

## 2020-07-24 DIAGNOSIS — B356 Tinea cruris: Secondary | ICD-10-CM

## 2020-07-24 MED ORDER — BENZONATATE 100 MG PO CAPS: 100.0000 mg | ORAL_CAPSULE | Freq: Three times a day (TID) | ORAL | 0 refills | Status: DC | PRN

## 2020-07-24 MED ORDER — PREDNISONE 20 MG PO TABS: ORAL_TABLET | ORAL | 0 refills | Status: AC

## 2020-07-24 MED ORDER — NYSTATIN 100000 UNIT/GM EX POWD: 1.0000 "application " | Freq: Three times a day (TID) | CUTANEOUS | 1 refills | Status: DC

## 2020-07-24 MED ORDER — CEPHALEXIN 500 MG PO CAPS: 500.0000 mg | ORAL_CAPSULE | Freq: Four times a day (QID) | ORAL | 0 refills | Status: DC

## 2020-07-24 MED ORDER — FLUCONAZOLE 150 MG PO TABS: 150.0000 mg | ORAL_TABLET | Freq: Every day | ORAL | 0 refills | Status: DC

## 2020-07-24 MED ORDER — TERBINAFINE HCL 250 MG PO TABS: 250.0000 mg | ORAL_TABLET | Freq: Every day | ORAL | 0 refills | Status: DC

## 2020-07-24 MED ORDER — ALBUTEROL SULFATE (2.5 MG/3ML) 0.083% IN NEBU: 2.5000 mg | INHALATION_SOLUTION | Freq: Four times a day (QID) | RESPIRATORY_TRACT | 1 refills | Status: DC | PRN

## 2020-07-24 NOTE — Patient Instructions (Addendum)
Schedule 1week f/up with pcp. Go to ED if symptoms worsen or if O2 Sat is <90% Use albuterol nebulizer every 6hrs x 2days, then as needed Monitor glucose and O2 Sat daily. Call pcp if glucose >250. Maintain adequate oral hydration. Take medications with food.

## 2020-07-24 NOTE — Telephone Encounter (Signed)
Caller: Octavius Call back # [phone]  Patient states his begin to vomiting about an hour ago and is unable to keep medication down. Patient is wondering if you could give him something for it.  [facility] - [place], [place] - [address] Phone:  [phone]  Fax:  [phone]

## 2020-07-24 NOTE — Telephone Encounter (Signed)
Will send to Dr. Elease Etienne who saw him today.

## 2020-07-24 NOTE — Progress Notes (Signed)
Virtual Visit via Video Note  I connected with@ on 07/24/20 at 10:45 AM EDT by a video enabled telemedicine application and verified that I am speaking with the correct person using two identifiers.  Location: Patient:Home Provider: Office Participants: patient, wife and provider  I discussed the limitations of evaluation and management by telemedicine and the availability of in person appointments. I also discussed with the patient that there may be a patient responsible charge related to this service. The patient expressed understanding and agreed to proceed.  CC: cough and wheezing x1day, worsening rash in groin x several weeks, left elbow pain/redness/swelling x 1week.  History of Present Illness: Non fasting glucose today:163 Unable to provide O2 sat during time of visit Rash This is a chronic problem. The current episode started more than 1 month ago. The problem has been gradually worsening since onset. The affected locations include the groin and genitalia. The rash is characterized by burning, itchiness and redness. He was exposed to nothing. Associated symptoms include coughing and shortness of breath. Pertinent negatives include no fever, rhinorrhea or sore throat. Past treatments include anti-itch cream and topical steroids (lotrimin, ketoconazole, and clotrimazole). The treatment provided no relief. His past medical history is significant for asthma. There is no history of allergies or eczema.  Asthma He complains of chest tightness, cough, frequent throat clearing, shortness of breath and wheezing. There is no hoarse voice or sputum production. This is a chronic (acute on chronic) problem. The current episode started more than 1 year ago (acute symptoms started yesterday). The problem occurs intermittently. The problem has been gradually worsening. The cough is non-productive. Pertinent negatives include no chest pain, dyspnea on exertion, fever, malaise/fatigue, myalgias, nasal  congestion, PND, rhinorrhea, sore throat or sweats. His symptoms are aggravated by strenuous activity. His symptoms are alleviated by beta-agonist and steroid inhaler. He reports minimal improvement on treatment. There are no known risk factors for lung disease. His past medical history is significant for asthma and COPD.  denies any known sick contact or recent travel or part to [employer], has completed COVID vaccine 01/2020.   He also presnet with left elbow redness and swelling, he had similar symptom on his hand 64months ago, resolved with use of keflex. He denies any elbow injury, no limited ROM.  Observations/Objective: Physical Exam Constitutional:      General: He is not in acute distress.    Appearance: He is obese.  Pulmonary:     Effort: Pulmonary effort is normal.     Comments: coarse cough during video call Musculoskeletal:        General: Swelling and tenderness present.     Left elbow: Swelling present. No deformity. Normal range of motion.     Left forearm: Normal.     Left wrist: Normal.     Left hand: Normal.       Arms:  Skin:    Findings: Erythema and rash present.  Neurological:     Mental Status: He is alert and oriented to person, place, and time.  Psychiatric:        Mood and Affect: Mood normal.    Assessment and Plan: Sergei was seen today for acute visit.  Diagnoses and all orders for this visit:  Moderate persistent asthma with acute exacerbation -     albuterol (PROVENTIL) (2.5 MG/3ML) 0.083% nebulizer solution; Take 3 mLs (2.5 mg total) by nebulization every 6 (six) hours as needed for wheezing or shortness of breath. -     predniSONE (  DELTASONE) 20 MG tablet; Take 1.5 tablets (30 mg total) by mouth daily with breakfast for 1 day, THEN 1 tablet (20 mg total) daily with breakfast for 2 days, THEN 0.5 tablets (10 mg total) daily with breakfast for 1 day. -     benzonatate (TESSALON) 100 MG capsule; Take 1 capsule (100 mg total) by mouth 3 (three)  times daily as needed for cough.  Tinea cruris -     nystatin (MYCOSTATIN/NYSTOP) powder; Apply 1 application topically 3 (three) times daily. -     Discontinue: fluconazole (DIFLUCAN) 150 MG tablet; Take 1 tablet (150 mg total) by mouth daily. Hold simvastatin for 7days -     terbinafine (LAMISIL) 250 MG tablet; Take 1 tablet (250 mg total) by mouth daily.  Cellulitis of left elbow -     cephALEXin (KEFLEX) 500 MG capsule; Take 1 capsule (500 mg total) by mouth 4 (four) times daily.   Follow Up Instructions: Schedule 1week f/up with pcp. Go to ED if symptoms worsen or if O2 Sat is <90% Use albuterol nebulizer every 6hrs x 2days, then as needed Monitor glucose and O2 Sat daily. Call pcp if glucose >250. Maintain adequate oral hydration Take medications with food.   I discussed the assessment and treatment plan with the patient. The patient was provided an opportunity to ask questions and all were answered. The patient agreed with the plan and demonstrated an understanding of the instructions.   The patient was advised to call back or seek an in-person evaluation if the symptoms worsen or if the condition fails to improve as anticipated.   Alysia Penna, NP

## 2020-07-25 NOTE — Telephone Encounter (Signed)
LVM for patient to return call Zofran was not prescribed during his appointment yesterday and he will need to contact his pcp if he wants this medication.

## 2020-07-25 NOTE — Telephone Encounter (Signed)
Patient called the "after hours triage line" to let them know that his pharmacy did not receive his prescription for Zofran. Please send to [facility].

## 2020-07-26 ENCOUNTER — Encounter (HOSPITAL_BASED_OUTPATIENT_CLINIC_OR_DEPARTMENT_OTHER): Payer: Self-pay

## 2020-07-26 ENCOUNTER — Emergency Department (HOSPITAL_BASED_OUTPATIENT_CLINIC_OR_DEPARTMENT_OTHER)
Admission: EM | Admit: 2020-07-26 | Discharge: 2020-07-26 | Disposition: A | Discharge status: Home / Self Care | Payer: Medicare Other | Attending: Emergency Medicine | Admitting: Emergency Medicine

## 2020-07-26 ENCOUNTER — Other Ambulatory Visit: Payer: Self-pay | Admitting: Internal Medicine

## 2020-07-26 ENCOUNTER — Other Ambulatory Visit: Payer: Self-pay

## 2020-07-26 DIAGNOSIS — I10 Essential (primary) hypertension: Secondary | ICD-10-CM | POA: Insufficient documentation

## 2020-07-26 DIAGNOSIS — Z96643 Presence of artificial hip joint, bilateral: Secondary | ICD-10-CM | POA: Insufficient documentation

## 2020-07-26 DIAGNOSIS — J45909 Unspecified asthma, uncomplicated: Secondary | ICD-10-CM | POA: Insufficient documentation

## 2020-07-26 DIAGNOSIS — L03116 Cellulitis of left lower limb: Secondary | ICD-10-CM | POA: Diagnosis not present

## 2020-07-26 DIAGNOSIS — Z7901 Long term (current) use of anticoagulants: Secondary | ICD-10-CM | POA: Insufficient documentation

## 2020-07-26 DIAGNOSIS — E1140 Type 2 diabetes mellitus with diabetic neuropathy, unspecified: Secondary | ICD-10-CM | POA: Diagnosis not present

## 2020-07-26 DIAGNOSIS — Z79899 Other long term (current) drug therapy: Secondary | ICD-10-CM | POA: Insufficient documentation

## 2020-07-26 DIAGNOSIS — Z7951 Long term (current) use of inhaled steroids: Secondary | ICD-10-CM | POA: Diagnosis not present

## 2020-07-26 DIAGNOSIS — R21 Rash and other nonspecific skin eruption: Secondary | ICD-10-CM | POA: Diagnosis present

## 2020-07-26 MED ORDER — DOXYCYCLINE HYCLATE 100 MG PO CAPS: 100.0000 mg | ORAL_CAPSULE | Freq: Two times a day (BID) | ORAL | 0 refills | Status: DC

## 2020-07-26 NOTE — ED Triage Notes (Signed)
Pt arrives with c/o rash to LLE X2 days. LLE is swollen and red with defined line. Denies recent injury, did have cellulitis about 3-4 weeks ago on right hand.

## 2020-07-27 ENCOUNTER — Encounter: Payer: Self-pay | Admitting: Internal Medicine

## 2020-07-29 ENCOUNTER — Encounter: Payer: Self-pay | Admitting: Internal Medicine

## 2020-07-29 ENCOUNTER — Telehealth: Payer: Self-pay | Admitting: Pharmacist

## 2020-07-29 NOTE — Progress Notes (Addendum)
Chronic Care Management Pharmacy Assistant   Name: Ryan Zhang  MRN: [id number] DOB: [birth year]  Reason for Encounter: Disease State  Patient Questions:  1.  Have you seen any other providers since your last visit? Yes  2.  Any changes in your medicines or health? Yes   PCP : Colon Branch, MD   Their chronic conditions include: Asthma, Diabetes, Hypertension, Hyperlipidemia, Bipolar Disorder, GERD, Allergic Rhinitis, Overactive Bladder, BPH, Insomnia  Office visit: 07-24-2020 (Internal medicine) Patient was presented by telemedicine with [name] reported cough and wheezing x1day, worsening rash in groin x several weeks, left elbow pain/redness/swelling x 1week. .  Provider prescribed albuterol neb solution every six hours as needed, prednisone 20 mg take 30 mg for one day the 20 mg daily for two days the 10 mg for one day, benzonatate 100 mg three times daily as needed, Nystatin powder topically three times daily, Terbinafine HCl 250 mg daily, cephalexin 500 mg four times daily.  Provider discontinued fluconazole 150 mg and hold simvastatin for 7 days 06-04-2020 (PCP) Patient was presented in the office with [facility] for left hand swelling. Provider prescribed Keflex for one week and Santyl daily. 05-09-2020 (PCP)  Patient was presented in the office with [facility] for an ER follow up. Patient reported one of his ankles gave up, he fell backwards, hit his head.   No medication changes.  Consult: 05-15-2020 (Podiatry) Patient was presented in the office with [facility] for a left foot injury. Patient reported stepping on a piece of glass while moving to into his new apartment. Radiographs taken with a marker demonstrate a small foreign body appears to be glass or metallic.  Lateral aspect of the foot beneath the fifth metatarsal.  Removal foreign body today without local anesthetic prepped it with Betadine debrided the skin remove the foreign body which was a small spicule of glass  measuring half a millimeter in diameter and 1/2 mm in length.  No purulence no malodor redressed with a dry sterile dressing leave until tomorrow soak Epson salt and warm water to completely heal.  No medication changes.  Hospitalizations: 07-26-2020 [facility] ED) Patient was presented in the Emergency Department with last attending provider Virgel Manifold for a rash which extends from ankle to proximal shin.  .  Patient was prescribed doxycycline 100 mg two times daily.  Allergies:   Allergies  Allergen Reactions  . Celexa  [Citalopram Hydrobromide] Other (See Comments)  . Depakote  [Divalproex Sodium] Other (See Comments)  . Methocarbamol     Other reaction(s): Hallucinations  . Paroxetine Hcl     Other reaction(s): Insomnia  . Smz-Tmp Ds [Sulfamethoxazole-Trimethoprim] Nausea And Vomiting  . Other     "Symbrax" alteration in blood sugar, pt unsure if low or high blood sugar  . Percocet [Oxycodone-Acetaminophen] Other (See Comments)    Hyperactivity  . Risperidone Other (See Comments)    REACTION: hyper  . Wellbutrin [Bupropion] Other (See Comments)    Hot flashes    Medications: Outpatient Encounter Medications as of 07/29/2020  Medication Sig Note  . acetaminophen (TYLENOL) 325 MG tablet Take 2 tablets (650 mg total) by mouth every 6 (six) hours as needed (mild pain; headache).   [name] albuterol (PROVENTIL) (2.5 MG/3ML) 0.083% nebulizer solution Take 3 mLs (2.5 mg total) by nebulization every 6 (six) hours as needed for wheezing or shortness of breath.   [name] albuterol (VENTOLIN HFA) 108 (90 Base) MCG/ACT inhaler Inhale 2 puffs into the lungs  every 6 (six) hours as needed for wheezing or shortness of breath.   [name] asenapine (SAPHRIS) 5 MG SUBL Place 5 mg under the tongue at bedtime.    [name] augmented betamethasone dipropionate (DIPROLENE-AF) 0.05 % ointment Apply topically 2 (two) times daily as needed.   [name] azelastine (ASTELIN) 0.1 % nasal spray Place 1 spray into both nostrils as needed for  rhinitis. To replace Flonase   . benzonatate (TESSALON) 100 MG capsule Take 1 capsule (100 mg total) by mouth 3 (three) times daily as needed for cough.   . budesonide-formoterol (SYMBICORT) 160-4.5 MCG/ACT inhaler Inhale 2 puffs into the lungs 2 (two) times daily.    . cephALEXin (KEFLEX) 500 MG capsule Take 1 capsule (500 mg total) by mouth 4 (four) times daily.   . clonazePAM (KLONOPIN) 0.5 MG tablet Take 0.5 mg by mouth See admin instructions. #2 at 8am, #1 at 2pm [facility])   . clopidogrel (PLAVIX) 75 MG tablet Take 1 tablet (75 mg total) by mouth daily.   . collagenase (SANTYL) ointment Apply 1 application topically daily.   [name] doxycycline (VIBRAMYCIN) 100 MG capsule Take 1 capsule (100 mg total) by mouth 2 (two) times daily.   . DULoxetine (CYMBALTA) 60 MG capsule Take 60 mg by mouth every morning.    [name] EPIPEN 2-PAK 0.3 MG/0.3ML SOAJ injection Reported on 04/29/2016 (Patient not taking: Reported on 06/04/2020) 01/01/2019: PRN  . esomeprazole (NEXIUM) 40 MG capsule Take 40 mg by mouth every morning.    . fexofenadine (ALLEGRA) 180 MG tablet Take 180 mg by mouth every morning. 8am [name])   . fluticasone (VERAMYST) 27.5 MCG/SPRAY nasal spray Place 2 sprays into the nose daily as needed for rhinitis or allergies.  02/26/2020: PRN [name])  . lamoTRIgine (LAMICTAL) 25 MG tablet Take 50 mg by mouth at bedtime.   . Lancets (ONETOUCH ULTRASOFT) lancets Check blood sugars no more than twice daily   . losartan (COZAAR) 25 MG tablet Take 1 tablet (25 mg total) by mouth every evening.   . montelukast (SINGULAIR) 10 MG tablet Take 10 mg by mouth at bedtime. [name]   . Multiple Vitamin (MULTIVITAMIN) tablet Take 1 tablet by mouth daily.    [name] nystatin (MYCOSTATIN/NYSTOP) powder Apply 1 application topically 3 (three) times daily.   [name] VERIO test strip use to check blood sugar no more than twice daily   . pioglitazone-metformin (ACTOPLUS MET) 15-850 MG tablet Take 1 tablet by mouth 2 (two)  times daily with a meal.   . simvastatin (ZOCOR) 40 MG tablet Take 1 tablet (40 mg total) by mouth at bedtime.   . solifenacin (VESICARE) 10 MG tablet Take 10 mg by mouth daily.   . tamsulosin (FLOMAX) 0.4 MG CAPS capsule Take 1 capsule (0.4 mg total) by mouth daily after supper.   . terbinafine (LAMISIL) 250 MG tablet Take 1 tablet (250 mg total) by mouth daily.   . traZODone (DESYREL) 50 MG tablet Take 50 mg by mouth at bedtime as needed for sleep. [name] 02/26/2020: Takes $RemoveBeforeDE'150mg'[id number]$  PRN (about 3 times per year)   No facility-administered encounter medications on file as of 07/29/2020.    Current Diagnosis: Patient Active Problem List   Diagnosis Date Noted  . Tinea cruris 07/24/2020  . Type 2 diabetes mellitus ([place]) 02/06/2020  . Acute respiratory failure ([place]) 07/18/2018  . Acute respiratory failure with hypoxia ([place]) 07/18/2018  . PCP NOTES >>>>>>>>>>>>>>>>>>>>>>>>>>>>>>> 01/12/2016  . Dry skin 05/09/2015  . Folliculitis 28/78/6767  .  Hearing loss in right ear 08/05/2014  . Pre-ulcerative corn or callous 12/27/2013  . Panic attack as reaction to stress 04/20/2012  . Dysphagia, neurologic 01/07/2012  . Morbid obesity ([place]) 01/07/2012  . Chest pain, musculoskeletal 12/07/2011  . Black-out (not amnesia) 12/07/2011  . Candidiasis of skin 08/12/2011  . Annual physical exam 06/07/2011  . OLECRANON BURSITIS 01/29/2011  . PARTIAL ARTERIAL OCCLUSION OF RETINA 05/27/2010  . Essential hypertension, benign 01/16/2010  . ABNORMAL ELECTROCARDIOGRAM 01/14/2010  . Asthma exacerbation 11/26/2009  . Asthma 10/14/2009  . GERD 10/14/2009  . LACERATION OF FINGER 05/14/2009  . Bipolar disorder ([place]) 02/20/2009  . PHIMOSIS 02/20/2009  . KNEE PAIN, CHRONIC 11/20/2008  . HIP PAIN, RIGHT 08/19/2008  . DM type 2 with diabetic peripheral neuropathy ([place]) 02/22/2007  . Hyperlipidemia associated with type 2 diabetes mellitus ([place]) 02/22/2007  . ALLERGIC RHINITIS, SEASONAL 02/22/2007  . LOW BACK PAIN,  CHRONIC 02/22/2007  . MENIERE'S DISEASE, HX OF 02/22/2007    Goals Addressed   None    Reviewed chart prior to disease state call. Spoke with patient regarding BP  Recent Office Vitals: BP Readings from Last 3 Encounters:  07/26/20 (!) 151/81  07/24/20 (!) 143/71  06/04/20 (!) 143/83   Pulse Readings from Last 3 Encounters:  07/26/20 89  07/24/20 98  06/04/20 89    Wt Readings from Last 3 Encounters:  07/26/20 255 lb (115.7 kg)  06/04/20 245 lb 6 oz (111.3 kg)  05/09/20 250 lb 2 oz (113.5 kg)     Kidney Function Lab Results  Component Value Date/Time   CREATININE 1.05 02/11/2020 11:30 AM   CREATININE 1.14 02/05/2020 09:31 PM   GFR 69.49 02/11/2020 11:30 AM   GFRNONAA >60 02/05/2020 09:31 PM   GFRAA >60 02/05/2020 09:31 PM    BMP Latest Ref Rng & Units 02/11/2020 02/05/2020 01/10/2020  Glucose 70 - 99 mg/dL 215(H) 148(H) 139(H)  BUN 6 - 23 mg/dL 23 26(H) 21  Creatinine 0.40 - 1.50 mg/dL 1.05 1.14 0.98  Sodium 135 - 145 mEq/L 138 138 140  Potassium 3.5 - 5.1 mEq/L 4.4 4.3 4.2  Chloride 96 - 112 mEq/L 101 105 105  CO2 19 - 32 mEq/L _0 Calcium 8.4 - 10.5 mg/dL 8.9 8.6(L) 9.0    . Current antihypertensive regimen:  ? Losartan 21m daily . How often are you checking your Blood Pressure? infrequently . Current home BP readings: None . What recent interventions/DTPs have been made by any provider to improve Blood Pressure control since last CPP Visit: Patient reported not feeling well lately due to his cellulitis and having shortness of breath. . Any recent hospitalizations or ED visits since last visit with CPP? Yes . What diet changes have been made to improve Blood Pressure Control?  o Patient reported starting back on solids due to not feeling good.  Patient states he tries to eat healthy. . What exercise is being done to improve your Blood Pressure Control?  o Patient reports due to his lung condition which he feels is getting worse, he is very inactive.   Patient states when he is active he becomes out of breath.  Adherence Review: Is the patient currently on ACE/ARB medication? Yes Losartan 251mdaily Does the patient have >5 day gap between last estimated fill dates? No   Follow-Up:  Pharmacist Review   ChThailandhannon, RMLevyrimary care at MeBourbonnaisharmacist Assistant [phone] by: KaDe BlanchPharmD Clinical Pharmacist Le[place]rimary  Care at [place]

## 2020-07-30 NOTE — ED Provider Notes (Signed)
[facility] EMERGENCY DEPARTMENT Provider Note   CSN: [id number] Arrival date & time: 07/26/20  1025     History Chief Complaint  Patient presents with  . Rash    Ryan Zhang is a 74 y.o. male.  HPI   74yM with rash in LLE. Onset 2d ago. Hurts/itches. No fever. No trauma. No new exposures. Has slowly spread since onset.   Past Medical History:  Diagnosis Date  . Allergy    allergy shots Dr. Velora Heckler  . Anemia   . Anxiety   . Asthma    moderate persistant  . Bipolar affective ([place])   . Cataract    both eyes  . COPD (chronic obstructive pulmonary disease) ([place])   . Depression   . Diabetes mellitus   . DJD (degenerative joint disease)   . Dysphagia   . Gallstones    hx of, s/p cholecystectomy  . GERD (gastroesophageal reflux disease)   . Hepatitis A    as teenager 77's  . Hollenhorst plaque    right eye  . Hyperlipidemia   . Hypertension   . Kidney stones    hx of  . Meniere's disease   . Motility disorder, esophageal   . Obesity   . Pancreatitis   . Personal history of colonic polyps 11/2004   hyperplastic.  . Stroke [facility])    per MRI    Patient Active Problem List   Diagnosis Date Noted  . Tinea cruris 07/24/2020  . Type 2 diabetes mellitus ([place]) 02/06/2020  . Acute respiratory failure ([place]) 07/18/2018  . Acute respiratory failure with hypoxia ([place]) 07/18/2018  . PCP NOTES >>>>>>>>>>>>>>>>>>>>>>>>>>>>>>> 01/12/2016  . Dry skin 05/09/2015  . Folliculitis 29/93/7169  . Hearing loss in right ear 08/05/2014  . Pre-ulcerative corn or callous 12/27/2013  . Panic attack as reaction to stress 04/20/2012  . Dysphagia, neurologic 01/07/2012  . Morbid obesity ([place]) 01/07/2012  . Chest pain, musculoskeletal 12/07/2011  . Black-out (not amnesia) 12/07/2011  . Candidiasis of skin 08/12/2011  . Annual physical exam 06/07/2011  . OLECRANON BURSITIS 01/29/2011  . PARTIAL ARTERIAL OCCLUSION OF RETINA 05/27/2010  . Essential hypertension, benign  01/16/2010  . ABNORMAL ELECTROCARDIOGRAM 01/14/2010  . Asthma exacerbation 11/26/2009  . Asthma 10/14/2009  . GERD 10/14/2009  . LACERATION OF FINGER 05/14/2009  . Bipolar disorder ([place]) 02/20/2009  . PHIMOSIS 02/20/2009  . KNEE PAIN, CHRONIC 11/20/2008  . HIP PAIN, RIGHT 08/19/2008  . DM type 2 with diabetic peripheral neuropathy ([place]) 02/22/2007  . Hyperlipidemia associated with type 2 diabetes mellitus ([place]) 02/22/2007  . ALLERGIC RHINITIS, SEASONAL 02/22/2007  . LOW BACK PAIN, CHRONIC 02/22/2007  . MENIERE'S DISEASE, HX OF 02/22/2007    Past Surgical History:  Procedure Laterality Date  . CATARACT EXTRACTION Bilateral 09/2015 and 10/2015  . CHOLECYSTECTOMY    . COLONOSCOPY    . TOTAL KNEE ARTHROPLASTY Bilateral    both knees       Family History  Problem Relation Age of Onset  . Diabetes Father   . Heart disease Brother   . Heart disease Maternal Grandmother   . Cancer Sister        unknown type  . Colon cancer Neg Hx   . Prostate cancer Neg Hx   . Colon polyps Neg Hx   . Esophageal cancer Neg Hx   . Rectal cancer Neg Hx   . Stomach cancer Neg Hx     Social History   Tobacco Use  . Smoking status: Never  Smoker  . Smokeless tobacco: Never Used  Substance Use Topics  . Alcohol use: Not Currently  . Drug use: No    Home Medications Prior to Admission medications   Medication Sig Start Date End Date Taking? Authorizing Provider  acetaminophen (TYLENOL) 325 MG tablet Take 2 tablets (650 mg total) by mouth every 6 (six) hours as needed (mild pain; headache). 02/06/20   Georgette Shell, MD  albuterol (PROVENTIL) (2.5 MG/3ML) 0.083% nebulizer solution Take 3 mLs (2.5 mg total) by nebulization every 6 (six) hours as needed for wheezing or shortness of breath. 07/24/20   Nche, Charlene Brooke, NP  albuterol (VENTOLIN HFA) 108 (90 Base) MCG/ACT inhaler Inhale 2 puffs into the lungs every 6 (six) hours as needed for wheezing or shortness of breath. 02/11/20   Colon Branch, MD  asenapine (SAPHRIS) 5 MG SUBL Place 5 mg under the tongue at bedtime.     [provider]  augmented betamethasone dipropionate (DIPROLENE-AF) 0.05 % ointment Apply topically 2 (two) times daily as needed. 03/13/18   Colon Branch, MD  azelastine (ASTELIN) 0.1 % nasal spray Place 1 spray into both nostrils as needed for rhinitis. To replace Flonase    [provider]  benzonatate (TESSALON) 100 MG capsule Take 1 capsule (100 mg total) by mouth 3 (three) times daily as needed for cough. 07/24/20   Nche, Charlene Brooke, NP  budesonide-formoterol (SYMBICORT) 160-4.5 MCG/ACT inhaler Inhale 2 puffs into the lungs 2 (two) times daily.     [provider]  cephALEXin (KEFLEX) 500 MG capsule Take 1 capsule (500 mg total) by mouth 4 (four) times daily. 07/24/20   Nche, Charlene Brooke, NP  clonazePAM (KLONOPIN) 0.5 MG tablet Take 0.5 mg by mouth See admin instructions. #2 at 8am, #1 at 2pm [facility])    [provider]  clopidogrel (PLAVIX) 75 MG tablet Take 1 tablet (75 mg total) by mouth daily. 11/05/19   Colon Branch, MD  collagenase (SANTYL) ointment Apply 1 application topically daily. 06/04/20   Colon Branch, MD  doxycycline (VIBRAMYCIN) 100 MG capsule Take 1 capsule (100 mg total) by mouth 2 (two) times daily. 07/26/20   Virgel Manifold, MD  DULoxetine (CYMBALTA) 60 MG capsule Take 60 mg by mouth every morning.     [provider]  EPIPEN 2-PAK 0.3 MG/0.3ML SOAJ injection Reported on 04/29/2016 Patient not taking: Reported on 06/04/2020 07/04/14   [provider]  esomeprazole (NEXIUM) 40 MG capsule Take 40 mg by mouth every morning.     [provider]  fexofenadine (ALLEGRA) 180 MG tablet Take 180 mg by mouth every morning. 8am [name])    [provider]  fluticasone (VERAMYST) 27.5 MCG/SPRAY nasal spray Place 2 sprays into the nose daily as needed for rhinitis or allergies.     [provider]  lamoTRIgine (LAMICTAL) 25  MG tablet Take 50 mg by mouth at bedtime.    [provider]  Lancets [name] ULTRASOFT) lancets Check blood sugars no more than twice daily 12/09/16   Colon Branch, MD  losartan (COZAAR) 25 MG tablet Take 1 tablet (25 mg total) by mouth every evening. 07/28/20   Colon Branch, MD  montelukast (SINGULAIR) 10 MG tablet Take 10 mg by mouth at bedtime. [name]    [provider]  Multiple Vitamin (MULTIVITAMIN) tablet Take 1 tablet by mouth daily.     [provider]  nystatin (MYCOSTATIN/NYSTOP) powder Apply 1 application topically 3 (  three) times daily. 07/24/20   Nche, Charlene Brooke, NP  [facility] VERIO test strip use to check blood sugar no more than twice daily 12/03/19   Colon Branch, MD  pioglitazone-metformin (ACTOPLUS MET) [phone] MG tablet Take 1 tablet by mouth 2 (two) times daily with a meal. 05/20/20   Colon Branch, MD  simvastatin (ZOCOR) 40 MG tablet Take 1 tablet (40 mg total) by mouth at bedtime. 01/14/20   Colon Branch, MD  solifenacin (VESICARE) 10 MG tablet Take 10 mg by mouth daily. 10/11/19   [provider]  tamsulosin (FLOMAX) 0.4 MG CAPS capsule Take 1 capsule (0.4 mg total) by mouth daily after supper. 02/13/20   Colon Branch, MD  terbinafine (LAMISIL) 250 MG tablet Take 1 tablet (250 mg total) by mouth daily. 07/24/20   Nche, Charlene Brooke, NP  traZODone (DESYREL) 50 MG tablet Take 50 mg by mouth at bedtime as needed for sleep. [name] 07/09/14   Midge Minium, MD    Allergies    Celexa  [citalopram hydrobromide], Depakote  [divalproex sodium], Methocarbamol, Paroxetine hcl, Smz-tmp ds [sulfamethoxazole-trimethoprim], Other, Percocet [oxycodone-acetaminophen], Risperidone, and Wellbutrin [bupropion]  Review of Systems   Review of Systems All systems reviewed and negative, other than as noted in HPI.  Physical Exam Updated Vital Signs BP (!) 151/81 (BP Location: Left Arm)   Pulse 89   Temp 97.9 F (36.6 C) (Oral)   Resp 18   Ht _0  (1.753  m)   Wt 115.7 kg   SpO2 95%   BMI 37.66 kg/m   Physical Exam Vitals and nursing note reviewed.  Constitutional:      General: He is not in acute distress.    Appearance: He is well-developed.  HENT:     Head: Normocephalic and atraumatic.  Eyes:     General:        Right eye: No discharge.        Left eye: No discharge.     Conjunctiva/sclera: Conjunctivae normal.  Cardiovascular:     Rate and Rhythm: Normal rate and regular rhythm.     Heart sounds: Normal heart sounds. No murmur heard.  No friction rub. No gallop.   Pulmonary:     Effort: Pulmonary effort is normal. No respiratory distress.     Breath sounds: Normal breath sounds.  Abdominal:     General: There is no distension.     Palpations: Abdomen is soft.     Tenderness: There is no abdominal tenderness.  Musculoskeletal:        General: Tenderness present.     Cervical back: Neck supple.  Skin:    General: Skin is warm and dry.     Comments: Rash l shin. Warm. Red. Ttp. Fairly well demarcated boarder. Extends from ankle to proximal shin.   Neurological:     Mental Status: He is alert.  Psychiatric:        Behavior: Behavior normal.        Thought Content: Thought content normal.     ED Results / Procedures / Treatments   Labs (all labs ordered are listed, but only abnormal results are displayed) Labs Reviewed - No data to display  EKG None  Radiology No results found.  Procedures Procedures (including critical care time)  Medications Ordered in ED Medications - No data to display  ED Course  I have reviewed the triage vital signs and the nursing notes.  Pertinent labs & imaging results that were available  during my care of the patient were reviewed by me and considered in my medical decision making (see chart for details).    MDM Rules/Calculators/A&P                          74ym with likely lle cellulitis. Not consistent with dvt. Abx. outpt fu.  Final Clinical Impression(s) / ED  Diagnoses Final diagnoses:  Cellulitis of left lower extremity    Rx / DC Orders ED Discharge Orders         Ordered    doxycycline (VIBRAMYCIN) 100 MG capsule  2 times daily        07/26/20 1254           Virgel Manifold, MD 07/30/20 1322

## 2020-08-01 ENCOUNTER — Other Ambulatory Visit: Payer: Self-pay

## 2020-08-01 ENCOUNTER — Ambulatory Visit: Payer: Medicare Other | Admitting: Internal Medicine

## 2020-08-01 ENCOUNTER — Telehealth: Payer: Self-pay

## 2020-08-01 NOTE — Telephone Encounter (Signed)
Pt showed today for appt w/ his wife- he informed that his covid test was still pending. Informed that we would be unable to see him in person today but he was offered a virtual visit in the parking lot. Pt and wife declined and upset that we can't allow him back. I apologized but informed that we have to go by protocols that [facility] gives [place]. We made an appt in person for Tuesday 9/21 at 1:40pm but I again told Pt AND his wife that if test is still not back OR it is positive he will not be seen in the office.

## 2020-08-05 ENCOUNTER — Ambulatory Visit: Payer: Medicare Other | Admitting: Internal Medicine

## 2020-08-05 ENCOUNTER — Encounter: Payer: Self-pay | Admitting: Internal Medicine

## 2020-08-05 ENCOUNTER — Other Ambulatory Visit: Payer: Self-pay

## 2020-08-05 ENCOUNTER — Ambulatory Visit (INDEPENDENT_AMBULATORY_CARE_PROVIDER_SITE_OTHER): Payer: Medicare Other | Admitting: Internal Medicine

## 2020-08-05 VITALS — BP 141/82 | HR 97 | Temp 98.1°F | Resp 18 | Ht 69.0 in | Wt 250.1 lb

## 2020-08-05 DIAGNOSIS — L03116 Cellulitis of left lower limb: Secondary | ICD-10-CM | POA: Diagnosis not present

## 2020-08-05 DIAGNOSIS — J4541 Moderate persistent asthma with (acute) exacerbation: Secondary | ICD-10-CM

## 2020-08-05 DIAGNOSIS — L03114 Cellulitis of left upper limb: Secondary | ICD-10-CM

## 2020-08-05 DIAGNOSIS — E1142 Type 2 diabetes mellitus with diabetic polyneuropathy: Secondary | ICD-10-CM | POA: Diagnosis not present

## 2020-08-05 DIAGNOSIS — B369 Superficial mycosis, unspecified: Secondary | ICD-10-CM | POA: Diagnosis not present

## 2020-08-05 MED ORDER — CEPHALEXIN 500 MG PO CAPS: 500.0000 mg | ORAL_CAPSULE | Freq: Four times a day (QID) | ORAL | 0 refills | Status: DC

## 2020-08-05 MED ORDER — HYDROCODONE-HOMATROPINE 5-1.5 MG/5ML PO SYRP: 5.0000 mL | ORAL_SOLUTION | Freq: Every evening | ORAL | 0 refills | Status: DC | PRN

## 2020-08-05 MED ORDER — DOXYCYCLINE HYCLATE 100 MG PO CAPS
100.0000 mg | ORAL_CAPSULE | Freq: Two times a day (BID) | ORAL | 0 refills | Status: DC
Start: 2020-08-05 — End: 2020-08-18

## 2020-08-05 MED ORDER — BENZONATATE 100 MG PO CAPS: 100.0000 mg | ORAL_CAPSULE | Freq: Three times a day (TID) | ORAL | 1 refills | Status: DC | PRN

## 2020-08-05 MED ORDER — HYDROCORTISONE 2.5 % EX CREA: TOPICAL_CREAM | Freq: Two times a day (BID) | CUTANEOUS | 0 refills | Status: DC

## 2020-08-05 NOTE — Progress Notes (Signed)
Subjective:    Patient ID: Ryan Zhang, male    DOB: 09-Jan-1946, 74 y.o.   MRN: [id number]  DOS:  08/05/2020 Type of visit - description: Follow-up from recent televisit and ER visit  07/24/2020, had televisit, diagnosed with moderate asthma with acute exacerbation, prescribed prednisone, Tessalon Perles, keflex.  Went to the ER 07/26/2020, had a rash at the L leg, onset around 07/24/2020,  DX with cellulitis, keflex changed to doxycycline  Also has a groin rash on-off x 3 months, nystatin helping   Review of Systems At this point he denies fever chills nausea vomiting.  Cellulitis of the left leg is not better apparently the redness had spread a little both proximally and distally. He has severe pain at the area of cellulitis, no actual joint pain. The swelling however  has actually decreased a little.  As far as the breathing, cough is about the same, no sputum production.  He feels that he is not sleeping well mostly because pain at the site of cellulitis and cough which is worse at night. All of the above has make him somewhat depressed but he denies suicidal ideas.   Past Medical History:  Diagnosis Date  . Allergy    allergy shots Dr. Velora Heckler  . Anemia   . Anxiety   . Asthma    moderate persistant  . Bipolar affective ([place])   . Cataract    both eyes  . COPD (chronic obstructive pulmonary disease) ([place])   . Depression   . Diabetes mellitus   . DJD (degenerative joint disease)   . Dysphagia   . Gallstones    hx of, s/p cholecystectomy  . GERD (gastroesophageal reflux disease)   . Hepatitis A    as teenager 89's  . Hollenhorst plaque    right eye  . Hyperlipidemia   . Hypertension   . Kidney stones    hx of  . Meniere's disease   . Motility disorder, esophageal   . Obesity   . Pancreatitis   . Personal history of colonic polyps 11/2004   hyperplastic.  . Stroke [facility])    per MRI    Past Surgical History:  Procedure Laterality Date  . CATARACT EXTRACTION  Bilateral 09/2015 and 10/2015  . CHOLECYSTECTOMY    . COLONOSCOPY    . TOTAL KNEE ARTHROPLASTY Bilateral    both knees    Allergies as of 08/05/2020      Reactions   Celexa  [citalopram Hydrobromide] Other (See Comments)   Depakote  [divalproex Sodium] Other (See Comments)   Methocarbamol    Other reaction(s): Hallucinations   Paroxetine Hcl    Other reaction(s): Insomnia   Smz-tmp Ds [sulfamethoxazole-trimethoprim] Nausea And Vomiting   Other    "Symbrax" alteration in blood sugar, pt unsure if low or high blood sugar   Percocet [oxycodone-acetaminophen] Other (See Comments)   Hyperactivity   Risperidone Other (See Comments)   REACTION: hyper   Wellbutrin [bupropion] Other (See Comments)   Hot flashes      Medication List       Accurate as of August 05, 2020 11:59 PM. If you have any questions, ask your nurse or doctor.        STOP taking these medications   terbinafine 250 MG tablet Commonly known as: LAMISIL Stopped by: Kathlene November, MD     TAKE these medications   acetaminophen 325 MG tablet Commonly known as: TYLENOL Take 2 tablets (650 mg total) by mouth every 6 (six)  hours as needed (mild pain; headache).   albuterol 108 (90 Base) MCG/ACT inhaler Commonly known as: VENTOLIN HFA Inhale 2 puffs into the lungs every 6 (six) hours as needed for wheezing or shortness of breath.   albuterol (2.5 MG/3ML) 0.083% nebulizer solution Commonly known as: PROVENTIL Take 3 mLs (2.5 mg total) by nebulization every 6 (six) hours as needed for wheezing or shortness of breath.   augmented betamethasone dipropionate 0.05 % ointment Commonly known as: DIPROLENE-AF Apply topically 2 (two) times daily as needed.   azelastine 0.1 % nasal spray Commonly known as: ASTELIN Place 1 spray into both nostrils as needed for rhinitis. To replace Flonase   benzonatate 100 MG capsule Commonly known as: TESSALON Take 1 capsule (100 mg total) by mouth 3 (three) times daily as needed for  cough.   budesonide-formoterol 160-4.5 MCG/ACT inhaler Commonly known as: SYMBICORT Inhale 2 puffs into the lungs 2 (two) times daily.   cephALEXin 500 MG capsule Commonly known as: KEFLEX Take 1 capsule (500 mg total) by mouth 4 (four) times daily.   clonazePAM 0.5 MG tablet Commonly known as: KLONOPIN Take 0.5 mg by mouth See admin instructions. #2 at 8am, #1 at 2pm [facility])   clopidogrel 75 MG tablet Commonly known as: PLAVIX Take 1 tablet (75 mg total) by mouth daily.   Cymbalta 60 MG capsule Generic drug: DULoxetine Take 60 mg by mouth every morning.   doxycycline 100 MG capsule Commonly known as: VIBRAMYCIN Take 1 capsule (100 mg total) by mouth 2 (two) times daily.   EpiPen 2-Pak 0.3 mg/0.3 mL Soaj injection Generic drug: EPINEPHrine Reported on 04/29/2016   esomeprazole 40 MG capsule Commonly known as: NEXIUM Take 40 mg by mouth every morning.   fexofenadine 180 MG tablet Commonly known as: ALLEGRA Take 180 mg by mouth every morning. 8am [name])   HYDROcodone-homatropine 5-1.5 MG/5ML syrup Commonly known as: HYCODAN Take 5 mLs by mouth at bedtime as needed for cough. Started by: Kathlene November, MD   hydrocortisone 2.5 % cream Apply topically 2 (two) times daily. Started by: Kathlene November, MD   lamoTRIgine 25 MG tablet Commonly known as: LAMICTAL Take 50 mg by mouth at bedtime.   losartan 25 MG tablet Commonly known as: COZAAR Take 1 tablet (25 mg total) by mouth every evening.   montelukast 10 MG tablet Commonly known as: SINGULAIR Take 10 mg by mouth at bedtime. [name]   multivitamin tablet Take 1 tablet by mouth daily.   nystatin powder Commonly known as: MYCOSTATIN/NYSTOP Apply 1 application topically 3 (three) times daily.   onetouch ultrasoft lancets Check blood sugars no more than twice daily   OneTouch Verio test strip Generic drug: glucose blood use to check blood sugar no more than twice daily   pioglitazone-metformin 15-850 MG  tablet Commonly known as: ACTOPLUS MET Take 1 tablet by mouth 2 (two) times daily with a meal.   Santyl ointment Generic drug: collagenase Apply 1 application topically daily.   Saphris 5 MG Subl 24 hr tablet Generic drug: asenapine Place 5 mg under the tongue at bedtime.   simvastatin 40 MG tablet Commonly known as: ZOCOR Take 1 tablet (40 mg total) by mouth at bedtime.   solifenacin 10 MG tablet Commonly known as: VESICARE Take 10 mg by mouth daily.   tamsulosin 0.4 MG Caps capsule Commonly known as: FLOMAX Take 1 capsule (0.4 mg total) by mouth daily after supper.   traZODone 50 MG tablet Commonly known as: DESYREL Take 50  mg by mouth at bedtime as needed for sleep. [name]   Veramyst 27.5 MCG/SPRAY nasal spray Generic drug: fluticasone Place 2 sprays into the nose daily as needed for rhinitis or allergies.          Objective:   Physical Exam Skin:        BP (!) 141/82 (BP Location: Left Arm, Patient Position: Sitting, Cuff Size: Normal)   Pulse 97   Temp 98.1 F (36.7 C) (Oral)   Resp 18   Ht 5' 9"  (1.753 m)   Wt 250 lb 2 oz (113.5 kg)   SpO2 95%   BMI 36.94 kg/m   General:   Well developed, NAD, BMI noted. HEENT:  Normocephalic . Face symmetric, atraumatic Lungs:  CTA B Normal respiratory effort, no intercostal retractions, no accessory muscle use. Heart: RRR,  no murmur.  Lower extremities: no pretibial edema bilaterally.  Good pedal pulses Skin: + Redness, warmness and TTP at the left leg, see graphic and picture.  No evidence of abscess, no open wounds, no discharge. He also has some redness at the scrotal inguinal area bilaterally. Neurologic:  alert & oriented X3.  Speech normal, gait appropriate for age and unassisted Psych--  Cognition and judgment appear intact.  Cooperative with normal attention span and concentration.  Behavior appropriate. No anxious or depressed appearing.        Assessment      Assessment DM HTN Hyperlipidemia Morbid obesity (BMI 39 plus DM) Bipolar, Depression ------ see psychiatry, Dr Casimiro Needle DJD-- hydrocodone rarely rx by [place] ortho Asthma-Allergies ------------ Dr [name]  GERD, h/o dysphagia (chronic, neurogenic-transfer dysphagia? See GI note 12-2011) Neuro: --? stroke : saw neuro 2013 d/t B transient visual loss, ASA changed to plavix.  --See CTA, MRIs of head-neck report  --Saw neuro 11-2013: fall, syncope,parkinsonism d/t sapharis? CV: Enlarged ascending Ao  --per CT 12-2015,CT chest 08-2016 stable.    --MRI chest 10/2017 stable, no further routine x-rays recommended --CT chest 07/2018: Atherosclerotic changes in the thoracic aorta.  Nl LE arterial dopplers 2015 Carotid artery disease: Per ultrasound 2011, last [place] 2018: <50% B, rx medical treatment, recheck 08/2019 < 1-39% B, stable 2 years  CT chest  ---RML  83m: 07-2014, CT 12-2015, CT 08-2016, MRI chest 12,2018, CT chest 07/2018:Stable  HOH H/o Mnire's disease  Iron deficiency anemia: Noted 01/2018, + Hemoccult, 09-2018: Colonoscopy and EGD were completely normal   PLAN: Cellulitis, left leg: Although the redness has increased a little, the swelling seems to be decreasing.  Patient is nontoxic-appearing. Plan: Continue doxycycline for additional 5 days, restart Keflex, leg elevation, hydrocortisone.  Call if not better.  Check CBC. Asthma exacerbation: Status post prednisone,  currently on doxycycline.  Tested negative for Covid Although he continued coughing, lungs are clear today.  Plan: Continue inhalers, cough control with Tessalon and low-dose hydrocodone (did not react well with Percocet but able to take hydrocodone per pt). Fungal dermatitis: Located at the groins, status post Lamisil oral, responding well to nystatin, continue with it. DM: CBGs in the morning 130, 170, in the afternoon no more than 170.Check a BMP, A1c, RTC already set up for October 4  This visit occurred during the  SARS-CoV-2 public health emergency.  Safety protocols were in place, including screening questions prior to the visit, additional usage of staff PPE, and extensive cleaning of exam room while observing appropriate contact time as indicated for disinfecting solutions.

## 2020-08-05 NOTE — Progress Notes (Signed)
Pre visit review using our clinic review tool, if applicable. No additional management support is needed unless otherwise documented below in the visit note. 

## 2020-08-05 NOTE — Patient Instructions (Addendum)
Per our records you are due for an eye exam. Please contact your eye doctor to schedule an appointment. Please have them send copies of your office visit notes to [place]. Our fax number is [phone].  Asthma: Continue your routine medications. Tessalon Perles as needed for cough Hydrocodone at bedtime to help with persistent cough  Cellulitis: Leg elevation Take both cephalexin and doxycycline for 5 days Hydrocortisone 2.5% cream: Apply twice a day Call if not gradually better  For the groin: Continue nystatin twice a day     GO TO THE LAB : Get the blood work    See you October 4

## 2020-08-06 ENCOUNTER — Encounter: Payer: Self-pay | Admitting: Internal Medicine

## 2020-08-06 LAB — CBC WITH DIFFERENTIAL/PLATELET
Absolute Monocytes: 643 cells/uL (ref 200–950)
Basophils Absolute: 71 cells/uL (ref 0–200)
Basophils Relative: 0.6 %
Eosinophils Absolute: 226 cells/uL (ref 15–500)
Eosinophils Relative: 1.9 %
HCT: 35.6 % — ABNORMAL LOW (ref 38.5–50.0)
Hemoglobin: 11.1 g/dL — ABNORMAL LOW (ref 13.2–17.1)
Lymphs Abs: 2297 cells/uL (ref 850–3900)
MCH: 25.6 pg — ABNORMAL LOW (ref 27.0–33.0)
MCHC: 31.2 g/dL — ABNORMAL LOW (ref 32.0–36.0)
MCV: 82.2 fL (ref 80.0–100.0)
MPV: 9.7 fL (ref 7.5–12.5)
Monocytes Relative: 5.4 %
Neutro Abs: 8663 cells/uL — ABNORMAL HIGH (ref 1500–7800)
Neutrophils Relative %: 72.8 %
Platelets: 419 10*3/uL — ABNORMAL HIGH (ref 140–400)
RBC: 4.33 10*6/uL (ref 4.20–5.80)
RDW: 14.5 % (ref 11.0–15.0)
Total Lymphocyte: 19.3 %
WBC: 11.9 10*3/uL — ABNORMAL HIGH (ref 3.8–10.8)

## 2020-08-06 LAB — HEMOGLOBIN A1C
Hgb A1c MFr Bld: 7.1 % of total Hgb — ABNORMAL HIGH (ref <5.7)
Mean Plasma Glucose: 157 (calc)
eAG (mmol/L): 8.7 (calc)

## 2020-08-06 LAB — COMPREHENSIVE METABOLIC PANEL
AG Ratio: 1.2 (calc) (ref 1.0–2.5)
ALT: 9 U/L (ref 9–46)
AST: 14 U/L (ref 10–35)
Albumin: 3.5 g/dL — ABNORMAL LOW (ref 3.6–5.1)
Alkaline phosphatase (APISO): 71 U/L (ref 35–144)
BUN: 15 mg/dL (ref 7–25)
CO2: 26 mmol/L (ref 20–32)
Calcium: 8.6 mg/dL (ref 8.6–10.3)
Chloride: 102 mmol/L (ref 98–110)
Creat: 1 mg/dL (ref 0.70–1.18)
Globulin: 3 g/dL (calc) (ref 1.9–3.7)
Glucose, Bld: 147 mg/dL — ABNORMAL HIGH (ref 65–99)
Potassium: 5.1 mmol/L (ref 3.5–5.3)
Sodium: 139 mmol/L (ref 135–146)
Total Bilirubin: 0.8 mg/dL (ref 0.2–1.2)
Total Protein: 6.5 g/dL (ref 6.1–8.1)

## 2020-08-06 NOTE — Assessment & Plan Note (Signed)
Cellulitis, left leg: Although the redness has increased a little, the swelling seems to be decreasing.  Patient is nontoxic-appearing. Plan: Continue doxycycline for additional 5 days, restart Keflex, leg elevation, hydrocortisone.  Call if not better.  Check CBC. Asthma exacerbation: Status post prednisone,  currently on doxycycline.  Tested negative for Covid Although he continued coughing, lungs are clear today.  Plan: Continue inhalers, cough control with Tessalon and low-dose hydrocodone (did not react well with Percocet but able to take hydrocodone per pt). Fungal dermatitis: Located at the groins, status post Lamisil oral, responding well to nystatin, continue with it. DM: CBGs in the morning 130, 170, in the afternoon no more than 170.Check a BMP, A1c, RTC already set up for October 4

## 2020-08-08 ENCOUNTER — Other Ambulatory Visit: Payer: Self-pay

## 2020-08-08 ENCOUNTER — Encounter: Payer: Self-pay | Admitting: Internal Medicine

## 2020-08-08 ENCOUNTER — Emergency Department (HOSPITAL_BASED_OUTPATIENT_CLINIC_OR_DEPARTMENT_OTHER)
Admission: EM | Admit: 2020-08-08 | Discharge: 2020-08-08 | Disposition: A | Discharge status: Home / Self Care | Payer: Medicare Other | Attending: Emergency Medicine | Admitting: Emergency Medicine

## 2020-08-08 ENCOUNTER — Encounter (HOSPITAL_BASED_OUTPATIENT_CLINIC_OR_DEPARTMENT_OTHER): Payer: Self-pay

## 2020-08-08 DIAGNOSIS — B369 Superficial mycosis, unspecified: Secondary | ICD-10-CM | POA: Diagnosis not present

## 2020-08-08 DIAGNOSIS — J449 Chronic obstructive pulmonary disease, unspecified: Secondary | ICD-10-CM | POA: Insufficient documentation

## 2020-08-08 DIAGNOSIS — I10 Essential (primary) hypertension: Secondary | ICD-10-CM | POA: Insufficient documentation

## 2020-08-08 DIAGNOSIS — J4540 Moderate persistent asthma, uncomplicated: Secondary | ICD-10-CM | POA: Insufficient documentation

## 2020-08-08 DIAGNOSIS — E119 Type 2 diabetes mellitus without complications: Secondary | ICD-10-CM | POA: Diagnosis not present

## 2020-08-08 DIAGNOSIS — Z7984 Long term (current) use of oral hypoglycemic drugs: Secondary | ICD-10-CM | POA: Insufficient documentation

## 2020-08-08 DIAGNOSIS — Z79899 Other long term (current) drug therapy: Secondary | ICD-10-CM | POA: Diagnosis not present

## 2020-08-08 DIAGNOSIS — Z96653 Presence of artificial knee joint, bilateral: Secondary | ICD-10-CM | POA: Diagnosis not present

## 2020-08-08 DIAGNOSIS — M79662 Pain in left lower leg: Secondary | ICD-10-CM | POA: Diagnosis present

## 2020-08-08 MED ORDER — FLUCONAZOLE 150 MG PO TABS: 150.0000 mg | ORAL_TABLET | ORAL | 0 refills | Status: AC

## 2020-08-08 MED ORDER — FLUCONAZOLE 150 MG PO TABS
150.0000 mg | ORAL_TABLET | Freq: Once | ORAL | Status: AC
  Administered 2020-08-08: 150 mg via ORAL
  Filled 2020-08-08: qty 1

## 2020-08-08 NOTE — ED Provider Notes (Signed)
[facility] Provider Note  CSN: [id number] Arrival date & time: 08/08/20 1609    History Chief Complaint  Patient presents with  . Multiple c/o    HPI  Ryan Zhang is a 74 y.o. male presents for re-evaluation of LLE redness and pain. He was seen in the ED and at PCP office in the last couple of weeks for same and has been on doxycycline and keflex without improvement. He reports redness has not improved. Has some moderate aching pain but no drainage, no fever, minimal swelling. He has been taking Aleve with some intermittent relief in pain. Also has several other non-acute complaints including groin fungal infection, mild cough and thumb soreness, but his main reason for coming today was his leg redness.    Past Medical History:  Diagnosis Date  . Allergy    allergy shots Dr. Velora Zhang  . Anemia   . Anxiety   . Asthma    moderate persistant  . Bipolar affective (Ryan Zhang)   . Cataract    both eyes  . COPD (chronic obstructive pulmonary disease) (Ryan Zhang)   . Depression   . Diabetes mellitus   . DJD (degenerative joint disease)   . Dysphagia   . Gallstones    hx of, s/p cholecystectomy  . GERD (gastroesophageal reflux disease)   . Hepatitis A    as teenager 71's  . Hollenhorst plaque    right eye  . Hyperlipidemia   . Hypertension   . Kidney stones    hx of  . Meniere's disease   . Motility disorder, esophageal   . Obesity   . Pancreatitis   . Personal history of colonic polyps 11/2004   hyperplastic.  . Stroke [facility] Zhang)    per MRI    Past Surgical History:  Procedure Laterality Date  . CATARACT EXTRACTION Bilateral 09/2015 and 10/2015  . CHOLECYSTECTOMY    . COLONOSCOPY    . TOTAL KNEE ARTHROPLASTY Bilateral    both knees    Family History  Problem Relation Age of Onset  . Diabetes Father   . Heart disease Brother   . Heart disease Maternal Grandmother   . Cancer Sister        unknown type  . Ryan cancer Neg Hx   . Prostate  cancer Neg Hx   . Ryan polyps Neg Hx   . Esophageal cancer Neg Hx   . Rectal cancer Neg Hx   . Stomach cancer Neg Hx     Social History   Tobacco Use  . Smoking status: Never Smoker  . Smokeless tobacco: Never Used  Substance Use Topics  . Alcohol use: Not Currently  . Drug use: No     Home Medications Prior to Admission medications   Medication Sig Start Date End Date Taking? Authorizing Provider  acetaminophen (TYLENOL) 325 MG tablet Take 2 tablets (650 mg total) by mouth every 6 (six) hours as needed (mild pain; headache). 02/06/20   Ryan Shell, Ryan Zhang  albuterol (PROVENTIL) (2.5 MG/3ML) 0.083% nebulizer solution Take 3 mLs (2.5 mg total) by nebulization every 6 (six) hours as needed for wheezing or shortness of breath. 07/24/20   Ryan Zhang, Ryan Brooke, NP  albuterol (VENTOLIN HFA) 108 (90 Base) MCG/ACT inhaler Inhale 2 puffs into the lungs every 6 (six) hours as needed for wheezing or shortness of breath. 02/11/20   Ryan Branch, Ryan Zhang  asenapine (SAPHRIS) 5 MG SUBL Place 5 mg under the tongue at bedtime.  Provider, Historical, Ryan Zhang  augmented betamethasone dipropionate (DIPROLENE-AF) 0.05 % ointment Apply topically 2 (two) times daily as needed. 03/13/18   Ryan Branch, Ryan Zhang  azelastine (ASTELIN) 0.1 % nasal spray Place 1 spray into both nostrils as needed for rhinitis. To replace Flonase    Provider, Historical, Ryan Zhang  benzonatate (TESSALON) 100 MG capsule Take 1 capsule (100 mg total) by mouth 3 (three) times daily as needed for cough. 08/05/20   Ryan Branch, Ryan Zhang  budesonide-formoterol Ryan Zhang) 160-4.5 MCG/ACT inhaler Inhale 2 puffs into the lungs 2 (two) times daily.     Provider, Historical, Ryan Zhang  cephALEXin (KEFLEX) 500 MG capsule Take 1 capsule (500 mg total) by mouth 4 (four) times daily. 08/05/20   Ryan Branch, Ryan Zhang  clonazePAM (KLONOPIN) 0.5 MG tablet Take 0.5 mg by mouth See admin instructions. #2 at 8am, #1 at 2pm Ryan Zhang)    Provider, Historical, Ryan Zhang  clopidogrel (PLAVIX) 75 MG  tablet Take 1 tablet (75 mg total) by mouth daily. 11/05/19   Ryan Branch, Ryan Zhang  collagenase (SANTYL) ointment Apply 1 application topically daily. 06/04/20   Ryan Branch, Ryan Zhang  doxycycline (VIBRAMYCIN) 100 MG capsule Take 1 capsule (100 mg total) by mouth 2 (two) times daily. 08/05/20   Ryan Branch, Ryan Zhang  DULoxetine (CYMBALTA) 60 MG capsule Take 60 mg by mouth every morning.     Provider, Historical, Ryan Zhang  EPIPEN 2-PAK 0.3 MG/0.3ML SOAJ injection Reported on 04/29/2016 Patient not taking: Reported on 06/04/2020 07/04/14   Provider, Historical, Ryan Zhang  esomeprazole (NEXIUM) 40 MG capsule Take 40 mg by mouth every morning.     Provider, Historical, Ryan Zhang  fexofenadine (ALLEGRA) 180 MG tablet Take 180 mg by mouth every morning. 8am Ryan Zhang)    Provider, Historical, Ryan Zhang  fluconazole (DIFLUCAN) 150 MG tablet Take 1 tablet (150 mg total) by mouth once a week for 1 dose. First dose in ER on 9-24 08/15/20 08/16/20  Ryan Hidden, Ryan Zhang  fluticasone (VERAMYST) 27.5 MCG/SPRAY nasal spray Place 2 sprays into the nose daily as needed for rhinitis or allergies.     Provider, Historical, Ryan Zhang  HYDROcodone-homatropine (HYCODAN) 5-1.5 MG/5ML syrup Take 5 mLs by mouth at bedtime as needed for cough. 08/05/20   Ryan Branch, Ryan Zhang  hydrocortisone 2.5 % cream Apply topically 2 (two) times daily. 08/05/20 08/05/21  Ryan Branch, Ryan Zhang  lamoTRIgine (LAMICTAL) 25 MG tablet Take 50 mg by mouth at bedtime.    Provider, Historical, Ryan Zhang  Lancets [name] ULTRASOFT) lancets Check blood sugars no more than twice daily 12/09/16   Ryan Branch, Ryan Zhang  losartan (COZAAR) 25 MG tablet Take 1 tablet (25 mg total) by mouth every evening. 07/28/20   Ryan Branch, Ryan Zhang  montelukast (SINGULAIR) 10 MG tablet Take 10 mg by mouth at bedtime. Ryan Zhang    Provider, Historical, Ryan Zhang  Multiple Vitamin (MULTIVITAMIN) tablet Take 1 tablet by mouth daily.     Provider, Historical, Ryan Zhang  nystatin (MYCOSTATIN/NYSTOP) powder Apply 1 application topically 3 (three) times daily. 07/24/20    Ryan Zhang, Ryan Brooke, NP  Ryan Zhang VERIO test strip use to check blood sugar no more than twice daily 12/03/19   Ryan Branch, Ryan Zhang  pioglitazone-metformin (ACTOPLUS MET) [phone] MG tablet Take 1 tablet by mouth 2 (two) times daily with a meal. 05/20/20   Ryan Branch, Ryan Zhang  simvastatin (ZOCOR) 40 MG tablet Take 1 tablet (40 mg total) by mouth at bedtime. 01/14/20   Ryan Branch, Ryan Zhang  solifenacin (  VESICARE) 10 MG tablet Take 10 mg by mouth daily. 10/11/19   Provider, Historical, Ryan Zhang  tamsulosin (FLOMAX) 0.4 MG CAPS capsule Take 1 capsule (0.4 mg total) by mouth daily after supper. 02/13/20   Ryan Branch, Ryan Zhang  traZODone (DESYREL) 50 MG tablet Take 50 mg by mouth at bedtime as needed for sleep. [name] 07/09/14   Midge Minium, Ryan Zhang     Allergies    Celexa  [citalopram hydrobromide], Depakote  [divalproex sodium], Methocarbamol, Paroxetine hcl, Smz-tmp ds [sulfamethoxazole-trimethoprim], Other, Percocet [oxycodone-acetaminophen], Risperidone, and Wellbutrin [bupropion]   Review of Systems   Review of Systems A comprehensive review of systems was completed and negative except as noted in HPI.    Physical Exam BP 126/76 (BP Location: Right Arm)   Pulse (!) 101   Temp 98.3 F (36.8 C) (Oral)   Resp 18   SpO2 93%   Physical Exam Vitals and nursing note reviewed.  Constitutional:      Appearance: Normal appearance.  HENT:     Head: Normocephalic and atraumatic.     Nose: Nose normal.     Mouth/Throat:     Mouth: Mucous membranes are moist.  Eyes:     Extraocular Movements: Extraocular movements intact.     Conjunctiva/sclera: Conjunctivae normal.  Cardiovascular:     Rate and Rhythm: Normal rate.  Pulmonary:     Effort: Pulmonary effort is normal.     Breath sounds: Normal breath sounds.  Abdominal:     General: Abdomen is flat.     Palpations: Abdomen is soft.     Tenderness: There is no abdominal tenderness.  Genitourinary:    Comments: Mild-moderate intertrigo bilateral  groin Musculoskeletal:        General: No swelling. Normal range of motion.     Cervical back: Neck supple.  Skin:    General: Skin is warm and dry.     Comments: Erythema of LLE, mostly anterior, no warmth or induration, similar to previous photos; see today's photo below.   Neurological:     General: No focal deficit present.     Mental Status: He is alert.  Psychiatric:        Mood and Affect: Mood normal.        ED Results / Procedures / Treatments   Labs (all labs ordered are listed, but only abnormal results are displayed) Labs Reviewed - No data to display  EKG None   Radiology No results found.  Procedures Procedures  Medications Ordered in the ED Medications  fluconazole (DIFLUCAN) tablet 150 mg (has no administration in time range)     MDM Rules/Calculators/A&P MDM Patient with LE redness, previously diagnosed with cellulitis but not improving on appropriate Abx, consider alternative diagnoses such as venous stasis skin changes, fungal infection/tinea, or some other non-specific dermatitis. Will give a trial of oral Diflucan, close outpatient PCP follow up for recheck and consideration of derm referral if not improving. RTED for any significant pain, fever, drainage, swelling or other concerns.  ED Course  I have reviewed the triage vital signs and the nursing notes.  Pertinent labs & imaging results that were available during my care of the patient were reviewed by me and considered in my medical decision making (see chart for details).     Final Clinical Impression(s) / ED Diagnoses Final diagnoses:  Fungal skin infection    Rx / DC Orders ED Discharge Orders         Ordered    fluconazole (DIFLUCAN)  150 MG tablet  Weekly        08/08/20 1926           Ryan Hidden, Ryan Zhang 08/08/20 [phone]

## 2020-08-08 NOTE — ED Triage Notes (Addendum)
Pt reports he was dx with cellulitis left LE 9/11-states area is no better-pt also c/o "joint sore" left thumb since 9/9-pt later wanted to c/o "yeast infection in my groin" and a cough "that I get this time of the year"-NAD-to triage using own cane

## 2020-08-11 ENCOUNTER — Encounter: Payer: Self-pay | Admitting: Internal Medicine

## 2020-08-12 ENCOUNTER — Encounter: Payer: Self-pay | Admitting: Internal Medicine

## 2020-08-18 ENCOUNTER — Ambulatory Visit (INDEPENDENT_AMBULATORY_CARE_PROVIDER_SITE_OTHER): Payer: Medicare Other | Admitting: Internal Medicine

## 2020-08-18 ENCOUNTER — Ambulatory Visit (HOSPITAL_BASED_OUTPATIENT_CLINIC_OR_DEPARTMENT_OTHER)
Admission: RE | Admit: 2020-08-18 | Discharge: 2020-08-18 | Disposition: A | Discharge status: Home / Self Care | Payer: Medicare Other | Source: Ambulatory Visit | Attending: Internal Medicine | Admitting: Internal Medicine

## 2020-08-18 ENCOUNTER — Other Ambulatory Visit: Payer: Self-pay

## 2020-08-18 ENCOUNTER — Encounter: Payer: Self-pay | Admitting: Internal Medicine

## 2020-08-18 VITALS — BP 128/87 | HR 79 | Temp 97.5°F | Resp 18 | Ht 69.0 in | Wt 246.2 lb

## 2020-08-18 DIAGNOSIS — E1142 Type 2 diabetes mellitus with diabetic polyneuropathy: Secondary | ICD-10-CM

## 2020-08-18 DIAGNOSIS — B369 Superficial mycosis, unspecified: Secondary | ICD-10-CM

## 2020-08-18 DIAGNOSIS — M7989 Other specified soft tissue disorders: Secondary | ICD-10-CM | POA: Diagnosis not present

## 2020-08-18 DIAGNOSIS — R600 Localized edema: Secondary | ICD-10-CM | POA: Diagnosis not present

## 2020-08-18 DIAGNOSIS — L03116 Cellulitis of left lower limb: Secondary | ICD-10-CM

## 2020-08-18 MED ORDER — DOXYCYCLINE HYCLATE 100 MG PO CAPS
100.0000 mg | ORAL_CAPSULE | Freq: Two times a day (BID) | ORAL | 0 refills | Status: DC
Start: 2020-08-18 — End: 2020-10-17

## 2020-08-18 NOTE — Progress Notes (Signed)
Pre visit review using our clinic review tool, if applicable. No additional management support is needed unless otherwise documented below in the visit note. 

## 2020-08-18 NOTE — Patient Instructions (Addendum)
Per our records you are due for an eye exam. Please contact your eye doctor to schedule an appointment. Please have them send copies of your office visit notes to [place]. Our fax number is [phone].  Continue antibiotics for 10 days Leg elevation For pain: Take Tylenol, cold compresses.    GO TO THE FRONT DESK, PLEASE SCHEDULE YOUR APPOINTMENTS Come back for a checkup in 2 months  STOP BY THE FIRST FLOOR: Schedule ultrasound

## 2020-08-18 NOTE — Assessment & Plan Note (Signed)
Cellulitis, left leg: Symptoms of started almost a month ago, he is a status post doxycycline, Keflex.  Very slow to respond, skin is a still red, warm and TTP. Minimal  swelling, good pedal pulses. Patient and wife are concerned about the risk of amputation (reassured that is very low) and DVT. Plan: [place] rule out DVT 10 more days of antibiotics, risk of diarrhea discussed but at this point cellulitis is not completely gone. Leg elevation, treat pain with Tylenol and icing. Fungal dermatitis: Much improved, minimal redness at the groin, the patient plans to continue using nystatin, powder.  I agree  . DM: Last A1c slightly elevated, 7.1.  No change for now. Mild anemia: Per last CBC, reassess on RTC Bipolar depression: The patient is upset about the recent cellulitis of pain but denies suicidal ideas Asthma exacerbation: Resolved. RTC 2 months

## 2020-08-18 NOTE — Progress Notes (Signed)
Subjective:    Patient ID: Ryan Zhang, male    DOB: 1946/04/12, 74 y.o.   MRN: [id number]  DOS:  08/18/2020 Type of visit - description: Follow-up, here with his wife.  Last visit was 08/05/2020, since then  he reported leg pain at the site of leg cellulitis intensity going up and down, eventually went to the ER 08/08/2020, felt to be improving, Diflucan was rx for a question of a fungal infection.   The patient reports that the left leg continue to be slightly swollen, worse at the end of the day.  Redness continue. The skin  is extremely sensitive to touch and it hurts if he bumps the area or apply some pressure. Denies fever chills  Breathing is at baseline.  Emotionally doing okay.  Groin rash improved  Review of Systems See above   Past Medical History:  Diagnosis Date  . Allergy    allergy shots Dr. Velora Heckler  . Anemia   . Anxiety   . Asthma    moderate persistant  . Bipolar affective ([place])   . Cataract    both eyes  . COPD (chronic obstructive pulmonary disease) ([place])   . Depression   . Diabetes mellitus   . DJD (degenerative joint disease)   . Dysphagia   . Gallstones    hx of, s/p cholecystectomy  . GERD (gastroesophageal reflux disease)   . Hepatitis A    as teenager 87's  . Hollenhorst plaque    right eye  . Hyperlipidemia   . Hypertension   . Kidney stones    hx of  . Meniere's disease   . Motility disorder, esophageal   . Obesity   . Pancreatitis   . Personal history of colonic polyps 11/2004   hyperplastic.  . Stroke [facility])    per MRI    Past Surgical History:  Procedure Laterality Date  . CATARACT EXTRACTION Bilateral 09/2015 and 10/2015  . CHOLECYSTECTOMY    . COLONOSCOPY    . TOTAL KNEE ARTHROPLASTY Bilateral    both knees    Allergies as of 08/18/2020      Reactions   Celexa  [citalopram Hydrobromide] Other (See Comments)   Depakote  [divalproex Sodium] Other (See Comments)   Methocarbamol    Other reaction(s): Hallucinations    Paroxetine Hcl    Other reaction(s): Insomnia   Smz-tmp Ds [sulfamethoxazole-trimethoprim] Nausea And Vomiting   Other    "Symbrax" alteration in blood sugar, pt unsure if low or high blood sugar   Percocet [oxycodone-acetaminophen] Other (See Comments)   Hyperactivity   Risperidone Other (See Comments)   REACTION: hyper   Wellbutrin [bupropion] Other (See Comments)   Hot flashes      Medication List       Accurate as of August 18, 2020  9:09 AM. If you have any questions, ask your nurse or doctor.        acetaminophen 325 MG tablet Commonly known as: TYLENOL Take 2 tablets (650 mg total) by mouth every 6 (six) hours as needed (mild pain; headache).   albuterol 108 (90 Base) MCG/ACT inhaler Commonly known as: VENTOLIN HFA Inhale 2 puffs into the lungs every 6 (six) hours as needed for wheezing or shortness of breath.   albuterol (2.5 MG/3ML) 0.083% nebulizer solution Commonly known as: PROVENTIL Take 3 mLs (2.5 mg total) by nebulization every 6 (six) hours as needed for wheezing or shortness of breath.   augmented betamethasone dipropionate 0.05 % ointment Commonly known as:  DIPROLENE-AF Apply topically 2 (two) times daily as needed.   azelastine 0.1 % nasal spray Commonly known as: ASTELIN Place 1 spray into both nostrils as needed for rhinitis. To replace Flonase   benzonatate 100 MG capsule Commonly known as: TESSALON Take 1 capsule (100 mg total) by mouth 3 (three) times daily as needed for cough.   budesonide-formoterol 160-4.5 MCG/ACT inhaler Commonly known as: SYMBICORT Inhale 2 puffs into the lungs 2 (two) times daily.   cephALEXin 500 MG capsule Commonly known as: KEFLEX Take 1 capsule (500 mg total) by mouth 4 (four) times daily.   clonazePAM 0.5 MG tablet Commonly known as: KLONOPIN Take 0.5 mg by mouth See admin instructions. #2 at 8am, #1 at 2pm [facility])   clopidogrel 75 MG tablet Commonly known as: PLAVIX Take 1 tablet (75 mg total) by mouth  daily.   Cymbalta 60 MG capsule Generic drug: DULoxetine Take 60 mg by mouth every morning.   doxycycline 100 MG capsule Commonly known as: VIBRAMYCIN Take 1 capsule (100 mg total) by mouth 2 (two) times daily.   EpiPen 2-Pak 0.3 mg/0.3 mL Soaj injection Generic drug: EPINEPHrine Reported on 04/29/2016   esomeprazole 40 MG capsule Commonly known as: NEXIUM Take 40 mg by mouth every morning.   fexofenadine 180 MG tablet Commonly known as: ALLEGRA Take 180 mg by mouth every morning. 8am [name])   HYDROcodone-homatropine 5-1.5 MG/5ML syrup Commonly known as: HYCODAN Take 5 mLs by mouth at bedtime as needed for cough.   hydrocortisone 2.5 % cream Apply topically 2 (two) times daily.   lamoTRIgine 25 MG tablet Commonly known as: LAMICTAL Take 50 mg by mouth at bedtime.   losartan 25 MG tablet Commonly known as: COZAAR Take 1 tablet (25 mg total) by mouth every evening.   montelukast 10 MG tablet Commonly known as: SINGULAIR Take 10 mg by mouth at bedtime. [name]   multivitamin tablet Take 1 tablet by mouth daily.   nystatin powder Commonly known as: MYCOSTATIN/NYSTOP Apply 1 application topically 3 (three) times daily.   onetouch ultrasoft lancets Check blood sugars no more than twice daily   OneTouch Verio test strip Generic drug: glucose blood use to check blood sugar no more than twice daily   pioglitazone-metformin 15-850 MG tablet Commonly known as: ACTOPLUS MET Take 1 tablet by mouth 2 (two) times daily with a meal.   Santyl ointment Generic drug: collagenase Apply 1 application topically daily.   Saphris 5 MG Subl 24 hr tablet Generic drug: asenapine Place 5 mg under the tongue at bedtime.   simvastatin 40 MG tablet Commonly known as: ZOCOR Take 1 tablet (40 mg total) by mouth at bedtime.   solifenacin 10 MG tablet Commonly known as: VESICARE Take 10 mg by mouth daily.   tamsulosin 0.4 MG Caps capsule Commonly known as:  FLOMAX Take 1 capsule (0.4 mg total) by mouth daily after supper.   traZODone 50 MG tablet Commonly known as: DESYREL Take 50 mg by mouth at bedtime as needed for sleep. [name]   Veramyst 27.5 MCG/SPRAY nasal spray Generic drug: fluticasone Place 2 sprays into the nose daily as needed for rhinitis or allergies.          Objective:   Physical Exam Skin:        BP 128/87 (BP Location: Right Arm, Patient Position: Sitting, Cuff Size: Normal)   Pulse 79   Temp (!) 97.5 F (36.4 C) (Oral)   Resp 18   Ht _0  (1.753  m)   Wt 246 lb 4 oz (111.7 kg)   SpO2 92%   BMI 36.36 kg/m  General:   Well developed, NAD, BMI noted. HEENT:  Normocephalic . Face symmetric, atraumatic Lungs:  CTA B Normal respiratory effort, no intercostal retractions, no accessory muscle use. Heart: RRR,  no murmur.  Lower extremities: Redness, warmness, anterior left leg, see picture. Minimal swelling around the left ankle Good bilateral pedal pulses Calves: Symmetric Skin: Still have some redness and maceration at the groins and penis (glans) Neurologic:  alert & oriented X3.  Speech normal, gait appropriate for age and unassisted Psych--  Cognition and judgment appear intact.  Cooperative with normal attention span and concentration.  Behavior appropriate. No anxious or depressed appearing.        Assessment      Assessment DM HTN Hyperlipidemia Morbid obesity (BMI 39 plus DM) Bipolar, Depression ------ see psychiatry, Dr Casimiro Needle DJD-- hydrocodone rarely rx by [place] [facility] Asthma-Allergies ------------ Dr [name]  GERD, h/o dysphagia (chronic, neurogenic-transfer dysphagia? See GI note 12-2011) Neuro: --? stroke : saw neuro 2013 d/t B transient visual loss, ASA changed to plavix.  --See CTA, MRIs of head-neck report  --Saw neuro 11-2013: fall, syncope,parkinsonism d/t sapharis? CV: Enlarged ascending Ao  --per CT 12-2015,CT chest 08-2016 stable.    --MRI chest 10/2017 stable, no  further routine x-rays recommended --CT chest 07/2018: Atherosclerotic changes in the thoracic aorta.  Nl LE arterial dopplers 2015 Carotid artery disease: Per ultrasound 2011, last [place] 2018: <50% B, rx medical treatment, recheck 08/2019 < 1-39% B, stable 2 years  CT chest  ---RML  68m: 07-2014, CT 12-2015, CT 08-2016, MRI chest 12,2018, CT chest 07/2018:Stable  HOH H/o Mnire's disease  Iron deficiency anemia: Noted 01/2018, + Hemoccult, 09-2018: Colonoscopy and EGD were completely normal   PLAN: Cellulitis, left leg: Symptoms of started almost a month ago, he is a status post doxycycline, Keflex.  Very slow to respond, skin is a still red, warm and TTP. Minimal  swelling, good pedal pulses. Patient and wife are concerned about the risk of amputation (reassured that is very low) and DVT. Plan: UKorearule out DVT 10 more days of antibiotics, risk of diarrhea discussed but at this point cellulitis is not completely gone. Leg elevation, treat pain with Tylenol and icing. Fungal dermatitis: Much improved, minimal redness at the groin, the patient plans to continue using nystatin, powder.  I agree  . DM: Last A1c slightly elevated, 7.1.  No change for now. Mild anemia: Per last CBC, reassess on RTC Bipolar depression: The patient is upset about the recent cellulitis of pain but denies suicidal ideas Asthma exacerbation: Resolved. RTC 2 months   This visit occurred during the SARS-CoV-2 public health emergency.  Safety protocols were in place, including screening questions prior to the visit, additional usage of staff PPE, and extensive cleaning of exam room while observing appropriate contact time as indicated for disinfecting solutions.

## 2020-08-20 ENCOUNTER — Other Ambulatory Visit: Payer: Self-pay | Admitting: Internal Medicine

## 2020-08-20 ENCOUNTER — Encounter: Payer: Self-pay | Admitting: Internal Medicine

## 2020-09-08 ENCOUNTER — Telehealth: Payer: Self-pay | Admitting: Pharmacist

## 2020-09-08 NOTE — Progress Notes (Addendum)
Chronic Care Management Pharmacy Assistant   Name: Ryan Zhang  MRN: [id number] DOB: 01-08-1946  Reason for Encounter: Disease State  Patient Questions:  1.  Have you seen any other providers since your last visit? Yes  2.  Any changes in your medicines or health? Yes  PCP : Colon Branch, MD   Office Visit: 09-11-20(PCP) Patient presented in office with [facility] for a follow up.  No medication changes. 08-18-20(PCP) Patient presented in office with [facility] for a follow up.  Provider recommended Tylenol and cold compresses 08-05-20(PCP) Patient presented in office with [facility] for a follow up.  Provider stopped terbinafine 250 mg medication.  Provider prescribed Hydrocodone-homatropine 5-1.74m/5 mL, Hydrocortisone 2.5%, Benzonatate 100 mg, and Doxycycline 100 mg  Hospitalizations: 08-08-20([facility]) Patient presented with last attending team CCalvert Cantorfor a fungal skin infection. Fluconazole 150 mg ordered.  No Consults since last CCM visit 07-29-20.  Allergies:   Allergies  Allergen Reactions  . Celexa  [Citalopram Hydrobromide] Other (See Comments)  . Depakote  [Divalproex Sodium] Other (See Comments)  . Methocarbamol     Other reaction(s): Hallucinations  . Paroxetine Hcl     Other reaction(s): Insomnia  . Smz-Tmp Ds [Sulfamethoxazole-Trimethoprim] Nausea And Vomiting  . Other     "Symbrax" alteration in blood sugar, pt unsure if low or high blood sugar  . Percocet [Oxycodone-Acetaminophen] Other (See Comments)    Hyperactivity  . Risperidone Other (See Comments)    REACTION: hyper  . Wellbutrin [Bupropion] Other (See Comments)    Hot flashes    Medications: Outpatient Encounter Medications as of 09/08/2020  Medication Sig Note  . acetaminophen (TYLENOL) 325 MG tablet Take 2 tablets (650 mg total) by mouth every 6 (six) hours as needed (mild pain; headache).   .Marland Kitchenalbuterol (PROVENTIL) (2.5 MG/3ML) 0.083% nebulizer solution  Take 3 mLs (2.5 mg total) by nebulization every 6 (six) hours as needed for wheezing or shortness of breath.   .Marland Kitchenalbuterol (VENTOLIN HFA) 108 (90 Base) MCG/ACT inhaler Inhale 2 puffs into the lungs every 6 (six) hours as needed for wheezing or shortness of breath.   .Marland Kitchenasenapine (SAPHRIS) 5 MG SUBL Place 5 mg under the tongue at bedtime.    .Marland Kitchenaugmented betamethasone dipropionate (DIPROLENE-AF) 0.05 % ointment Apply topically 2 (two) times daily as needed.   .Marland Kitchenazelastine (ASTELIN) 0.1 % nasal spray Place 1 spray into both nostrils as needed for rhinitis. To replace Flonase   . benzonatate (TESSALON) 100 MG capsule Take 1 capsule (100 mg total) by mouth 3 (three) times daily as needed for cough.   . budesonide-formoterol (SYMBICORT) 160-4.5 MCG/ACT inhaler Inhale 2 puffs into the lungs 2 (two) times daily.    . clonazePAM (KLONOPIN) 0.5 MG tablet Take 0.5 mg by mouth See admin instructions. #2 at 8am, #1 at 2pm ([facility]   . clopidogrel (PLAVIX) 75 MG tablet Take 1 tablet (75 mg total) by mouth daily.   . collagenase (SANTYL) ointment Apply 1 application topically daily.   .Marland Kitchendoxycycline (VIBRAMYCIN) 100 MG capsule Take 1 capsule (100 mg total) by mouth 2 (two) times daily.   . DULoxetine (CYMBALTA) 60 MG capsule Take 60 mg by mouth every morning.    .Marland KitchenEPIPEN 2-PAK 0.3 MG/0.3ML SOAJ injection Reported on 04/29/2016 (Patient not taking: Reported on 06/04/2020) 01/01/2019: PRN  . esomeprazole (NEXIUM) 40 MG capsule Take 40 mg by mouth every morning.    . fexofenadine (ALLEGRA) 180 MG  tablet Take 180 mg by mouth every morning. 8am [name])   . fluticasone (VERAMYST) 27.5 MCG/SPRAY nasal spray Place 2 sprays into the nose daily as needed for rhinitis or allergies.  02/26/2020: PRN [name])  . HYDROcodone-homatropine (HYCODAN) 5-1.5 MG/5ML syrup Take 5 mLs by mouth at bedtime as needed for cough.   . hydrocortisone 2.5 % cream Apply topically 2 (two) times daily.   [name] lamoTRIgine (LAMICTAL) 25 MG tablet  Take 50 mg by mouth at bedtime.   . Lancets (ONETOUCH ULTRASOFT) lancets Check blood sugars no more than twice daily   . losartan (COZAAR) 25 MG tablet Take 1 tablet (25 mg total) by mouth every evening.   . montelukast (SINGULAIR) 10 MG tablet Take 10 mg by mouth at bedtime. [name]   . Multiple Vitamin (MULTIVITAMIN) tablet Take 1 tablet by mouth daily.    [name] nystatin (MYCOSTATIN/NYSTOP) powder Apply 1 application topically 3 (three) times daily.   [name] VERIO test strip use to check blood sugar no more than twice daily   . pioglitazone-metformin (ACTOPLUS MET) 15-850 MG tablet TAKE ONE TABLET BY MOUTH TWICE A DAY WITH MEALS   . simvastatin (ZOCOR) 40 MG tablet Take 1 tablet (40 mg total) by mouth at bedtime.   . solifenacin (VESICARE) 10 MG tablet Take 10 mg by mouth daily.   . tamsulosin (FLOMAX) 0.4 MG CAPS capsule Take 1 capsule (0.4 mg total) by mouth daily after supper.   . traZODone (DESYREL) 50 MG tablet Take 50 mg by mouth at bedtime as needed for sleep. [name] 02/26/2020: Takes 128m PRN (about 3 times per year)   No facility-administered encounter medications on file as of 09/08/2020.    Current Diagnosis: Patient Active Problem List   Diagnosis Date Noted  . Tinea cruris 07/24/2020  . Type 2 diabetes mellitus ([place] 02/06/2020  . Acute respiratory failure ([place] 07/18/2018  . Acute respiratory failure with hypoxia ([place] 07/18/2018  . PCP NOTES >>>>>>>>>>>>>>>>>>>>>>>>>>>>>>> 01/12/2016  . Dry skin 05/09/2015  . Folliculitis 001/07/3234 . Hearing loss in right ear 08/05/2014  . Pre-ulcerative corn or callous 12/27/2013  . Panic attack as reaction to stress 04/20/2012  . Dysphagia, neurologic 01/07/2012  . Morbid obesity ([place] 01/07/2012  . Chest pain, musculoskeletal 12/07/2011  . Black-out (not amnesia) 12/07/2011  . Candidiasis of skin 08/12/2011  . Annual physical exam 06/07/2011  . OLECRANON BURSITIS 01/29/2011  . PARTIAL ARTERIAL OCCLUSION OF RETINA  05/27/2010  . Essential hypertension, benign 01/16/2010  . ABNORMAL ELECTROCARDIOGRAM 01/14/2010  . Asthma exacerbation 11/26/2009  . Asthma 10/14/2009  . GERD 10/14/2009  . LACERATION OF FINGER 05/14/2009  . Bipolar disorder ([place] 02/20/2009  . PHIMOSIS 02/20/2009  . KNEE PAIN, CHRONIC 11/20/2008  . HIP PAIN, RIGHT 08/19/2008  . DM type 2 with diabetic peripheral neuropathy ([place] 02/22/2007  . Hyperlipidemia associated with type 2 diabetes mellitus ([place] 02/22/2007  . ALLERGIC RHINITIS, SEASONAL 02/22/2007  . LOW BACK PAIN, CHRONIC 02/22/2007  . MENIERE'S DISEASE, HX OF 02/22/2007    Goals Addressed   None    Reviewed chart prior to disease state call. Spoke with patient regarding BP  Recent Office Vitals: BP Readings from Last 3 Encounters:  08/18/20 128/87  08/08/20 128/72  08/05/20 (!) 141/82   Pulse Readings from Last 3 Encounters:  08/18/20 79  08/08/20 94  08/05/20 97    Wt Readings from Last 3 Encounters:  08/18/20 246 lb 4 oz (111.7 kg)  08/05/20 250 lb 2 oz (113.5  kg)  07/26/20 255 lb (115.7 kg)     Kidney Function Lab Results  Component Value Date/Time   CREATININE 1.00 08/05/2020 02:26 PM   CREATININE 1.05 02/11/2020 11:30 AM   CREATININE 1.14 02/05/2020 09:31 PM   GFR 69.49 02/11/2020 11:30 AM   GFRNONAA >60 02/05/2020 09:31 PM   GFRAA >60 02/05/2020 09:31 PM    BMP Latest Ref Rng & Units 08/05/2020 02/11/2020 02/05/2020  Glucose 65 - 99 mg/dL 147(H) 215(H) 148(H)  BUN 7 - 25 mg/dL 15 23 26(H)  Creatinine 0.70 - 1.18 mg/dL 1.00 1.05 1.14  BUN/Creat Ratio 6 - 22 (calc) NOT APPLICABLE - -  Sodium 491 - 146 mmol/L 139 138 138  Potassium 3.5 - 5.3 mmol/L 5.1 4.4 4.3  Chloride 98 - 110 mmol/L 102 101 105  CO2 20 - 32 mmol/L 26 29 25   Calcium 8.6 - 10.3 mg/dL 8.6 8.9 8.6(L)    . Current antihypertensive regimen:  ? Losartan 4m daily . How often are you checking your Blood Pressure? infrequently . Current home BP readings: patient stated he used  MAP calculation which stated 95%. Stated readings range from 129-140/81-72 . What recent interventions/DTPs have been made by any provider to improve Blood Pressure control since last CPP Visit: None . Any recent hospitalizations or ED visits since last visit with CPP? No . What diet changes have been made to improve Blood Pressure Control?  o States no change in diet. . What exercise is being done to improve your Blood Pressure Control?  o Patient stated he becomes out of breath. Patient stated he is out of shape and "feels weak as a kitten".  Stated he has been walking lately with his dog.  Stated he is taking it easy and admits to not needing emergency inhaler.  Stated he has used his emergency inhaler a couple of times at the grocery store or while out. Stated lung condition has not changed.  Adherence Review:  Stated his legs feels so much better; stated cellulitis is better Is the patient currently on ACE/ARB medication? Yes Losartan 28mdaily Does the patient have >5 day gap between last estimated fill dates? No    Follow-Up:  Pharmacist Review   ChThailandhannon, RMShorehamrimary care at MeOscodaharmacist Assistant [phone] by: KaDe BlanchPharmD Clinical Pharmacist LeRegalrimary Care at [phone]

## 2020-09-10 DIAGNOSIS — J3089 Other allergic rhinitis: Secondary | ICD-10-CM | POA: Diagnosis not present

## 2020-09-10 DIAGNOSIS — J3081 Allergic rhinitis due to animal (cat) (dog) hair and dander: Secondary | ICD-10-CM | POA: Diagnosis not present

## 2020-09-10 DIAGNOSIS — J301 Allergic rhinitis due to pollen: Secondary | ICD-10-CM | POA: Diagnosis not present

## 2020-09-11 ENCOUNTER — Encounter: Payer: Self-pay | Admitting: Internal Medicine

## 2020-09-11 ENCOUNTER — Ambulatory Visit (INDEPENDENT_AMBULATORY_CARE_PROVIDER_SITE_OTHER): Payer: Medicare Other | Admitting: Internal Medicine

## 2020-09-11 ENCOUNTER — Other Ambulatory Visit: Payer: Self-pay

## 2020-09-11 VITALS — BP 125/80 | HR 74 | Temp 98.0°F | Resp 18 | Ht 69.0 in | Wt 251.4 lb

## 2020-09-11 DIAGNOSIS — M7022 Olecranon bursitis, left elbow: Secondary | ICD-10-CM

## 2020-09-11 NOTE — Assessment & Plan Note (Signed)
Left olecranon bursitis: The swelling was worse 3 weeks ago now is decreasing.  Still has significant swelling of the bursa.  Most likely he will benefit for draining the bursa, we agreed that he will call his orthopedic doctor.  Call if he needs help to make the appointment. Cellulitis, left leg: Since the last visit, blood work was okay, [place] (-) for DVT.  Overall much improved.  Encouraged him to continue avoiding swelling by keeping the legs elevated.

## 2020-09-11 NOTE — Progress Notes (Signed)
Pre visit review using our clinic review tool, if applicable. No additional management support is needed unless otherwise documented below in the visit note. 

## 2020-09-11 NOTE — Patient Instructions (Addendum)
Per our records you are due for an eye exam. Please contact your eye doctor to schedule an appointment. Please have them send copies of your office visit notes to [place]. Our fax number is [phone].   Please see your doctor at [facility]

## 2020-09-11 NOTE — Progress Notes (Signed)
Subjective:    Patient ID: Ryan Zhang, male    DOB: 07-05-1946, 74 y.o.   MRN: [id number]  DOS:  09/11/2020 Type of visit - description: Acute, here with his wife Patient noted a lump on the left arm 3 weeks ago. The lump has steadily diminished in size but has a remaining swelling  located over the left elbow. Denies any fever chills.  No openings, no discharge. No fall or injury at the area. Pain is minimal.  Cellulitis, see last visit, improved.    Review of Systems See above   Past Medical History:  Diagnosis Date  . Allergy    allergy shots Dr. Velora Heckler  . Anemia   . Anxiety   . Asthma    moderate persistant  . Bipolar affective ([place])   . Cataract    both eyes  . COPD (chronic obstructive pulmonary disease) ([place])   . Depression   . Diabetes mellitus   . DJD (degenerative joint disease)   . Dysphagia   . Gallstones    hx of, s/p cholecystectomy  . GERD (gastroesophageal reflux disease)   . Hepatitis A    as teenager 83's  . Hollenhorst plaque    right eye  . Hyperlipidemia   . Hypertension   . Kidney stones    hx of  . Meniere's disease   . Motility disorder, esophageal   . Obesity   . Pancreatitis   . Personal history of colonic polyps 11/2004   hyperplastic.  . Stroke [facility])    per MRI    Past Surgical History:  Procedure Laterality Date  . CATARACT EXTRACTION Bilateral 09/2015 and 10/2015  . CHOLECYSTECTOMY    . COLONOSCOPY    . TOTAL KNEE ARTHROPLASTY Bilateral    both knees    Allergies as of 09/11/2020      Reactions   Celexa  [citalopram Hydrobromide] Other (See Comments)   Depakote  [divalproex Sodium] Other (See Comments)   Methocarbamol    Other reaction(s): Hallucinations   Paroxetine Hcl    Other reaction(s): Insomnia   Smz-tmp Ds [sulfamethoxazole-trimethoprim] Nausea And Vomiting   Other    "Symbrax" alteration in blood sugar, pt unsure if low or high blood sugar   Percocet [oxycodone-acetaminophen] Other (See Comments)     Hyperactivity   Risperidone Other (See Comments)   REACTION: hyper   Wellbutrin [bupropion] Other (See Comments)   Hot flashes      Medication List       Accurate as of September 11, 2020  5:52 PM. If you have any questions, ask your nurse or doctor.        acetaminophen 325 MG tablet Commonly known as: TYLENOL Take 2 tablets (650 mg total) by mouth every 6 (six) hours as needed (mild pain; headache).   albuterol 108 (90 Base) MCG/ACT inhaler Commonly known as: VENTOLIN HFA Inhale 2 puffs into the lungs every 6 (six) hours as needed for wheezing or shortness of breath.   albuterol (2.5 MG/3ML) 0.083% nebulizer solution Commonly known as: PROVENTIL Take 3 mLs (2.5 mg total) by nebulization every 6 (six) hours as needed for wheezing or shortness of breath.   augmented betamethasone dipropionate 0.05 % ointment Commonly known as: DIPROLENE-AF Apply topically 2 (two) times daily as needed.   azelastine 0.1 % nasal spray Commonly known as: ASTELIN Place 1 spray into both nostrils as needed for rhinitis. To replace Flonase   benzonatate 100 MG capsule Commonly known as: TESSALON Take 1 capsule (  100 mg total) by mouth 3 (three) times daily as needed for cough.   budesonide-formoterol 160-4.5 MCG/ACT inhaler Commonly known as: SYMBICORT Inhale 2 puffs into the lungs 2 (two) times daily.   clonazePAM 0.5 MG tablet Commonly known as: KLONOPIN Take 0.5 mg by mouth See admin instructions. #2 at 8am, #1 at 2pm [facility])   clopidogrel 75 MG tablet Commonly known as: PLAVIX Take 1 tablet (75 mg total) by mouth daily.   Cymbalta 60 MG capsule Generic drug: DULoxetine Take 60 mg by mouth every morning.   doxycycline 100 MG capsule Commonly known as: VIBRAMYCIN Take 1 capsule (100 mg total) by mouth 2 (two) times daily.   EpiPen 2-Pak 0.3 mg/0.3 mL Soaj injection Generic drug: EPINEPHrine Reported on 04/29/2016   esomeprazole 40 MG capsule Commonly known as: NEXIUM Take  40 mg by mouth every morning.   fexofenadine 180 MG tablet Commonly known as: ALLEGRA Take 180 mg by mouth every morning. 8am [name])   HYDROcodone-homatropine 5-1.5 MG/5ML syrup Commonly known as: HYCODAN Take 5 mLs by mouth at bedtime as needed for cough.   hydrocortisone 2.5 % cream Apply topically 2 (two) times daily.   lamoTRIgine 25 MG tablet Commonly known as: LAMICTAL Take 50 mg by mouth at bedtime.   losartan 25 MG tablet Commonly known as: COZAAR Take 1 tablet (25 mg total) by mouth every evening.   montelukast 10 MG tablet Commonly known as: SINGULAIR Take 10 mg by mouth at bedtime. [name]   multivitamin tablet Take 1 tablet by mouth daily.   nystatin powder Commonly known as: MYCOSTATIN/NYSTOP Apply 1 application topically 3 (three) times daily.   onetouch ultrasoft lancets Check blood sugars no more than twice daily   OneTouch Verio test strip Generic drug: glucose blood use to check blood sugar no more than twice daily   pioglitazone-metformin 15-850 MG tablet Commonly known as: ACTOPLUS MET TAKE ONE TABLET BY MOUTH TWICE A DAY WITH MEALS   Santyl ointment Generic drug: collagenase Apply 1 application topically daily.   Saphris 5 MG Subl 24 hr tablet Generic drug: asenapine Place 5 mg under the tongue at bedtime.   simvastatin 40 MG tablet Commonly known as: ZOCOR Take 1 tablet (40 mg total) by mouth at bedtime.   solifenacin 10 MG tablet Commonly known as: VESICARE Take 10 mg by mouth daily.   tamsulosin 0.4 MG Caps capsule Commonly known as: FLOMAX Take 1 capsule (0.4 mg total) by mouth daily after supper.   traZODone 50 MG tablet Commonly known as: DESYREL Take 50 mg by mouth at bedtime as needed for sleep. [name]   Veramyst 27.5 MCG/SPRAY nasal spray Generic drug: fluticasone Place 2 sprays into the nose daily as needed for rhinitis or allergies.          Objective:   Physical Exam Musculoskeletal:        Arms:    BP 125/80 (BP Location: Right Arm, Patient Position: Sitting, Cuff Size: Normal)   Pulse 74   Temp 98 F (36.7 C) (Oral)   Resp 18   Ht 5' 9"  (1.753 m)   Wt 251 lb 6 oz (114 kg)   SpO2 100%   BMI 37.12 kg/m  General:   Well developed, NAD, BMI noted. HEENT:  Normocephalic . Face symmetric, atraumatic Lower extremities: Right leg essentially back to normal, left leg: Minimal redness/ warmness at the pretibial area, much improved compared to the last visit. Skin: Not pale. Not jaundice Neurologic:  alert &  oriented X3.  Speech normal, gait appropriate for age and unassisted Psych--  Cognition and judgment appear intact.  Cooperative with normal attention span and concentration.  Behavior appropriate. No anxious or depressed appearing.      Assessment     Assessment DM HTN Hyperlipidemia Morbid obesity (BMI 39 plus DM) Bipolar, Depression ------ see psychiatry, Dr Casimiro Needle DJD-- hydrocodone rarely rx by [place] ortho Asthma-Allergies ------------ Dr [name]  GERD, h/o dysphagia (chronic, neurogenic-transfer dysphagia? See GI note 12-2011) Neuro: --? stroke : saw neuro 2013 d/t B transient visual loss, ASA changed to plavix.  --See CTA, MRIs of head-neck report  --Saw neuro 11-2013: fall, syncope,parkinsonism d/t sapharis? CV: Enlarged ascending Ao  --per CT 12-2015,CT chest 08-2016 stable.    --MRI chest 10/2017 stable, no further routine x-rays recommended --CT chest 07/2018: Atherosclerotic changes in the thoracic aorta.  Nl LE arterial dopplers 2015 Carotid artery disease: Per ultrasound 2011, last [place] 2018: <50% B, rx medical treatment, recheck 08/2019 < 1-39% B, stable 2 years  CT chest  ---RML  30m: 07-2014, CT 12-2015, CT 08-2016, MRI chest 12,2018, CT chest 07/2018:Stable  HOH H/o Mnire's disease  Iron deficiency anemia: Noted 01/2018, + Hemoccult, 09-2018: Colonoscopy and EGD were completely normal   PLAN: Left olecranon bursitis: The swelling was  worse 3 weeks ago now is decreasing.  Still has significant swelling of the bursa.  Most likely he will benefit for draining the bursa, we agreed that he will call his orthopedic doctor.  Call if he needs help to make the appointment. Cellulitis, left leg: Since the last visit, blood work was okay, [place] for DVT.  Overall much improved.  Encouraged him to continue avoiding swelling by keeping the legs elevated.     This visit occurred during the SARS-CoV-2 public health emergency.  Safety protocols were in place, including screening questions prior to the visit, additional usage of staff PPE, and extensive cleaning of exam room while observing appropriate contact time as indicated for disinfecting solutions.

## 2020-09-12 DIAGNOSIS — J3089 Other allergic rhinitis: Secondary | ICD-10-CM | POA: Diagnosis not present

## 2020-09-12 DIAGNOSIS — J301 Allergic rhinitis due to pollen: Secondary | ICD-10-CM | POA: Diagnosis not present

## 2020-09-16 DIAGNOSIS — J301 Allergic rhinitis due to pollen: Secondary | ICD-10-CM | POA: Diagnosis not present

## 2020-09-16 DIAGNOSIS — J3089 Other allergic rhinitis: Secondary | ICD-10-CM | POA: Diagnosis not present

## 2020-09-19 DIAGNOSIS — J301 Allergic rhinitis due to pollen: Secondary | ICD-10-CM | POA: Diagnosis not present

## 2020-09-19 DIAGNOSIS — J3089 Other allergic rhinitis: Secondary | ICD-10-CM | POA: Diagnosis not present

## 2020-09-24 DIAGNOSIS — J301 Allergic rhinitis due to pollen: Secondary | ICD-10-CM | POA: Diagnosis not present

## 2020-09-24 DIAGNOSIS — J3089 Other allergic rhinitis: Secondary | ICD-10-CM | POA: Diagnosis not present

## 2020-09-26 ENCOUNTER — Encounter: Payer: Self-pay | Admitting: Internal Medicine

## 2020-09-30 DIAGNOSIS — J301 Allergic rhinitis due to pollen: Secondary | ICD-10-CM | POA: Diagnosis not present

## 2020-09-30 DIAGNOSIS — J3089 Other allergic rhinitis: Secondary | ICD-10-CM | POA: Diagnosis not present

## 2020-10-02 DIAGNOSIS — M7022 Olecranon bursitis, left elbow: Secondary | ICD-10-CM | POA: Diagnosis not present

## 2020-10-03 DIAGNOSIS — J3089 Other allergic rhinitis: Secondary | ICD-10-CM | POA: Diagnosis not present

## 2020-10-03 DIAGNOSIS — J300 Vasomotor rhinitis: Secondary | ICD-10-CM | POA: Diagnosis not present

## 2020-10-03 DIAGNOSIS — J4540 Moderate persistent asthma, uncomplicated: Secondary | ICD-10-CM | POA: Diagnosis not present

## 2020-10-07 DIAGNOSIS — J3089 Other allergic rhinitis: Secondary | ICD-10-CM | POA: Diagnosis not present

## 2020-10-07 DIAGNOSIS — J3081 Allergic rhinitis due to animal (cat) (dog) hair and dander: Secondary | ICD-10-CM | POA: Diagnosis not present

## 2020-10-07 DIAGNOSIS — J301 Allergic rhinitis due to pollen: Secondary | ICD-10-CM | POA: Diagnosis not present

## 2020-10-11 ENCOUNTER — Other Ambulatory Visit: Payer: Self-pay

## 2020-10-11 ENCOUNTER — Encounter (HOSPITAL_BASED_OUTPATIENT_CLINIC_OR_DEPARTMENT_OTHER): Payer: Self-pay | Admitting: *Deleted

## 2020-10-11 ENCOUNTER — Emergency Department (HOSPITAL_BASED_OUTPATIENT_CLINIC_OR_DEPARTMENT_OTHER)
Admission: EM | Admit: 2020-10-11 | Discharge: 2020-10-11 | Disposition: A | Discharge status: Home / Self Care | Payer: Medicare Other | Attending: Emergency Medicine | Admitting: Emergency Medicine

## 2020-10-11 DIAGNOSIS — H60321 Hemorrhagic otitis externa, right ear: Secondary | ICD-10-CM | POA: Insufficient documentation

## 2020-10-11 DIAGNOSIS — J449 Chronic obstructive pulmonary disease, unspecified: Secondary | ICD-10-CM | POA: Insufficient documentation

## 2020-10-11 DIAGNOSIS — Z7951 Long term (current) use of inhaled steroids: Secondary | ICD-10-CM | POA: Diagnosis not present

## 2020-10-11 DIAGNOSIS — H9211 Otorrhea, right ear: Secondary | ICD-10-CM | POA: Diagnosis present

## 2020-10-11 DIAGNOSIS — E1169 Type 2 diabetes mellitus with other specified complication: Secondary | ICD-10-CM | POA: Diagnosis not present

## 2020-10-11 DIAGNOSIS — Z96653 Presence of artificial knee joint, bilateral: Secondary | ICD-10-CM | POA: Diagnosis not present

## 2020-10-11 DIAGNOSIS — I10 Essential (primary) hypertension: Secondary | ICD-10-CM | POA: Insufficient documentation

## 2020-10-11 DIAGNOSIS — Z79899 Other long term (current) drug therapy: Secondary | ICD-10-CM | POA: Insufficient documentation

## 2020-10-11 DIAGNOSIS — Z7984 Long term (current) use of oral hypoglycemic drugs: Secondary | ICD-10-CM | POA: Diagnosis not present

## 2020-10-11 MED ORDER — CIPROFLOXACIN-DEXAMETHASONE 0.3-0.1 % OT SUSP
4.0000 [drp] | Freq: Two times a day (BID) | OTIC | Status: DC
  Administered 2020-10-11: 4 [drp] via OTIC
  Filled 2020-10-11: qty 7.5

## 2020-10-11 MED ORDER — CEFDINIR 300 MG PO CAPS: 300.0000 mg | ORAL_CAPSULE | Freq: Two times a day (BID) | ORAL | 0 refills | Status: DC

## 2020-10-11 NOTE — ED Triage Notes (Signed)
Pt reports bloody drainage from right ear today- states ear has been itching. He is deaf in his left ear

## 2020-10-11 NOTE — ED Provider Notes (Signed)
[facility] EMERGENCY DEPARTMENT Provider Note   CSN: [id number] Arrival date & time: 10/11/20  1501     History Chief Complaint  Patient presents with  . Ear Drainage    Ryan Zhang is a 74 y.o. male.  74yo M w/ PMH below including HTN, HLD, T2DM, bipolar d/o who p/w R ear drainage. Pt noticed bloody R ear drainage today w/ associated itching. No ear pain or change in hearing. He has hearing loss and wears hearing aids. No fever, cough/cold symptoms, or recent illness. No swimming.   The history is provided by the patient.  Ear Drainage       Past Medical History:  Diagnosis Date  . Allergy    allergy shots Dr. Velora Heckler  . Anemia   . Anxiety   . Asthma    moderate persistant  . Bipolar affective ([place])   . Cataract    both eyes  . COPD (chronic obstructive pulmonary disease) ([place])   . Depression   . Diabetes mellitus   . DJD (degenerative joint disease)   . Dysphagia   . Gallstones    hx of, s/p cholecystectomy  . GERD (gastroesophageal reflux disease)   . Hepatitis A    as teenager 65's  . Hollenhorst plaque    right eye  . Hyperlipidemia   . Hypertension   . Kidney stones    hx of  . Meniere's disease   . Motility disorder, esophageal   . Obesity   . Pancreatitis   . Personal history of colonic polyps 11/2004   hyperplastic.  . Stroke [facility])    per MRI    Patient Active Problem List   Diagnosis Date Noted  . Tinea cruris 07/24/2020  . Type 2 diabetes mellitus ([place]) 02/06/2020  . Acute respiratory failure ([place]) 07/18/2018  . Acute respiratory failure with hypoxia ([place]) 07/18/2018  . PCP NOTES >>>>>>>>>>>>>>>>>>>>>>>>>>>>>>> 01/12/2016  . Dry skin 05/09/2015  . Folliculitis 29/79/8921  . Hearing loss in right ear 08/05/2014  . Pre-ulcerative corn or callous 12/27/2013  . Panic attack as reaction to stress 04/20/2012  . Dysphagia, neurologic 01/07/2012  . Morbid obesity ([place]) 01/07/2012  . Chest pain, musculoskeletal 12/07/2011  .  Black-out (not amnesia) 12/07/2011  . Candidiasis of skin 08/12/2011  . Annual physical exam 06/07/2011  . OLECRANON BURSITIS 01/29/2011  . PARTIAL ARTERIAL OCCLUSION OF RETINA 05/27/2010  . Essential hypertension, benign 01/16/2010  . ABNORMAL ELECTROCARDIOGRAM 01/14/2010  . Asthma exacerbation 11/26/2009  . Asthma 10/14/2009  . GERD 10/14/2009  . LACERATION OF FINGER 05/14/2009  . Bipolar disorder ([place]) 02/20/2009  . PHIMOSIS 02/20/2009  . KNEE PAIN, CHRONIC 11/20/2008  . HIP PAIN, RIGHT 08/19/2008  . DM type 2 with diabetic peripheral neuropathy ([place]) 02/22/2007  . Hyperlipidemia associated with type 2 diabetes mellitus ([place]) 02/22/2007  . ALLERGIC RHINITIS, SEASONAL 02/22/2007  . LOW BACK PAIN, CHRONIC 02/22/2007  . MENIERE'S DISEASE, HX OF 02/22/2007    Past Surgical History:  Procedure Laterality Date  . CATARACT EXTRACTION Bilateral 09/2015 and 10/2015  . CHOLECYSTECTOMY    . COLONOSCOPY    . TOTAL KNEE ARTHROPLASTY Bilateral    both knees       Family History  Problem Relation Age of Onset  . Diabetes Father   . Heart disease Brother   . Heart disease Maternal Grandmother   . Cancer Sister        unknown type  . Colon cancer Neg Hx   . Prostate cancer Neg Hx   .  Colon polyps Neg Hx   . Esophageal cancer Neg Hx   . Rectal cancer Neg Hx   . Stomach cancer Neg Hx     Social History   Tobacco Use  . Smoking status: Never Smoker  . Smokeless tobacco: Never Used  Substance Use Topics  . Alcohol use: Not Currently  . Drug use: No    Home Medications Prior to Admission medications   Medication Sig Start Date End Date Taking? Authorizing Provider  acetaminophen (TYLENOL) 325 MG tablet Take 2 tablets (650 mg total) by mouth every 6 (six) hours as needed (mild pain; headache). 02/06/20   Georgette Shell, MD  albuterol (PROVENTIL) (2.5 MG/3ML) 0.083% nebulizer solution Take 3 mLs (2.5 mg total) by nebulization every 6 (six) hours as needed for wheezing or  shortness of breath. 07/24/20   Nche, Charlene Brooke, NP  albuterol (VENTOLIN HFA) 108 (90 Base) MCG/ACT inhaler Inhale 2 puffs into the lungs every 6 (six) hours as needed for wheezing or shortness of breath. 02/11/20   Colon Branch, MD  asenapine (SAPHRIS) 5 MG SUBL Place 5 mg under the tongue at bedtime.     [provider]  augmented betamethasone dipropionate (DIPROLENE-AF) 0.05 % ointment Apply topically 2 (two) times daily as needed. 03/13/18   Colon Branch, MD  azelastine (ASTELIN) 0.1 % nasal spray Place 1 spray into both nostrils as needed for rhinitis. To replace Flonase    [provider]  benzonatate (TESSALON) 100 MG capsule Take 1 capsule (100 mg total) by mouth 3 (three) times daily as needed for cough. Patient not taking: Reported on 09/11/2020 08/05/20   Colon Branch, MD  budesonide-formoterol [facility]) 160-4.5 MCG/ACT inhaler Inhale 2 puffs into the lungs 2 (two) times daily.     [provider]  cefdinir (OMNICEF) 300 MG capsule Take 1 capsule (300 mg total) by mouth 2 (two) times daily for 7 days. 10/11/20 10/18/20  Wenda Vanschaick, Wenda Overland, MD  clonazePAM (KLONOPIN) 0.5 MG tablet Take 0.5 mg by mouth See admin instructions. #2 at 8am, #1 at 2pm [facility])    [provider]  clopidogrel (PLAVIX) 75 MG tablet Take 1 tablet (75 mg total) by mouth daily. 11/05/19   Colon Branch, MD  collagenase (SANTYL) ointment Apply 1 application topically daily. 06/04/20   Colon Branch, MD  doxycycline (VIBRAMYCIN) 100 MG capsule Take 1 capsule (100 mg total) by mouth 2 (two) times daily. Patient not taking: Reported on 09/11/2020 08/18/20   Colon Branch, MD  DULoxetine (CYMBALTA) 60 MG capsule Take 60 mg by mouth every morning.     [provider]  EPIPEN 2-PAK 0.3 MG/0.3ML SOAJ injection Reported on 04/29/2016 Patient not taking: Reported on 06/04/2020 07/04/14   [provider]  esomeprazole (NEXIUM) 40 MG capsule Take 40 mg by mouth every morning.      [provider]  fexofenadine (ALLEGRA) 180 MG tablet Take 180 mg by mouth every morning. 8am [name])    [provider]  fluticasone (VERAMYST) 27.5 MCG/SPRAY nasal spray Place 2 sprays into the nose daily as needed for rhinitis or allergies.     [provider]  HYDROcodone-homatropine (HYCODAN) 5-1.5 MG/5ML syrup Take 5 mLs by mouth at bedtime as needed for cough. Patient not taking: Reported on 09/11/2020 08/05/20   Colon Branch, MD  hydrocortisone 2.5 % cream Apply topically 2 (two) times daily. 08/05/20 08/05/21  Colon Branch, MD  lamoTRIgine (LAMICTAL) 25 MG tablet  Take 50 mg by mouth at bedtime.    [provider]  Lancets [name] ULTRASOFT) lancets Check blood sugars no more than twice daily 12/09/16   Colon Branch, MD  losartan (COZAAR) 25 MG tablet Take 1 tablet (25 mg total) by mouth every evening. 07/28/20   Colon Branch, MD  montelukast (SINGULAIR) 10 MG tablet Take 10 mg by mouth at bedtime. [name]    [provider]  Multiple Vitamin (MULTIVITAMIN) tablet Take 1 tablet by mouth daily.     [provider]  nystatin (MYCOSTATIN/NYSTOP) powder Apply 1 application topically 3 (three) times daily. 07/24/20   Nche, Charlene Brooke, NP  [facility] VERIO test strip use to check blood sugar no more than twice daily 12/03/19   Colon Branch, MD  pioglitazone-metformin (ACTOPLUS MET) 564 [id number] 3365 MG tablet TAKE ONE TABLET BY MOUTH TWICE A DAY WITH MEALS 08/20/20   Colon Branch, MD  simvastatin (ZOCOR) 40 MG tablet Take 1 tablet (40 mg total) by mouth at bedtime. 01/14/20   Colon Branch, MD  solifenacin (VESICARE) 10 MG tablet Take 10 mg by mouth daily. 10/11/19   [provider]  tamsulosin (FLOMAX) 0.4 MG CAPS capsule Take 1 capsule (0.4 mg total) by mouth daily after supper. 02/13/20   Colon Branch, MD  traZODone (DESYREL) 50 MG tablet Take 50 mg by mouth at bedtime as needed for sleep. [name] 07/09/14   Midge Minium, MD    Allergies     Celexa  [citalopram hydrobromide], Depakote  [divalproex sodium], Methocarbamol, Paroxetine hcl, Smz-tmp ds [sulfamethoxazole-trimethoprim], Other, Percocet [oxycodone-acetaminophen], Risperidone, and Wellbutrin [bupropion]  Review of Systems   Review of Systems  Constitutional: Negative for fever.  HENT: Positive for ear discharge. Negative for ear pain and rhinorrhea.   Respiratory: Negative for cough.   Skin: Negative for rash.    Physical Exam Updated Vital Signs BP 133/67 (BP Location: Right Arm)   Pulse 73   Temp 97.9 F (36.6 C) (Oral)   Resp 18   Ht 5' 10"  (1.778 m)   SpO2 99%   BMI 36.07 kg/m   Physical Exam Vitals and nursing note reviewed.  Constitutional:      General: He is not in acute distress.    Appearance: He is well-developed.  HENT:     Head: Normocephalic and atraumatic.     Right Ear: No mastoid tenderness.     Left Ear: Tympanic membrane and ear canal normal.     Ears:     Comments: Edema of proximal external R ear canal with bloody debris along canal, some cerumen distally obscures full view of TM; mild tenderness w/ manipulation of external ear; no post-auricular swelling, erythema, or tenderness Eyes:     Conjunctiva/sclera: Conjunctivae normal.  Musculoskeletal:     Cervical back: Neck supple.  Skin:    General: Skin is warm and dry.  Neurological:     Mental Status: He is alert and oriented to person, place, and time.  Psychiatric:        Judgment: Judgment normal.     ED Results / Procedures / Treatments   Labs (all labs ordered are listed, but only abnormal results are displayed) Labs Reviewed - No data to display  EKG None  Radiology No results found.  Procedures Procedures (including critical care time)  Medications Ordered in ED Medications  ciprofloxacin-dexamethasone (CIPRODEX) 0.3-0.1 % OTIC (EAR) suspension 4 drop (has no administration in time range)    ED Course  I have reviewed the triage vital signs and the  nursing notes. MDM Rules/Calculators/A&P                          Exam c/w otitis externa, which may be in part due to diabetes as well as hearing aid use promoting excess moisture retention. I'm not able to completely visualize TM, will cover w/ oral abx in addition to ciprodex. Placed ear wick for better antibiotic penetration. Instructed to f/u in 1 week w/ ENT to ensure improvement as patient is higher risk for complication due to diabetes. No signs/sx malignant otitis externa/mastoiditis currently. Reviewed return precautions.  Final Clinical Impression(s) / ED Diagnoses Final diagnoses:  Acute hemorrhagic otitis externa of right ear    Rx / DC Orders ED Discharge Orders         Ordered    cefdinir (OMNICEF) 300 MG capsule  2 times daily        10/11/20 1626           Felissa Blouch, Wenda Overland, MD 10/11/20 1636

## 2020-10-11 NOTE — Discharge Instructions (Signed)
Apply 4 drops of ciprodex in R ear onto ear wick, twice daily for 5-7 days Take omnicef pills as prescribed (twice daily for 7 days) Follow up with ENT ~1 week Leave hearing aid out for now until ENT follow up to ensure infection clears. Return to ER if any redness, swelling, worsening drainage of ear or fever

## 2020-10-16 ENCOUNTER — Ambulatory Visit (INDEPENDENT_AMBULATORY_CARE_PROVIDER_SITE_OTHER): Payer: Medicare Other | Admitting: Otolaryngology

## 2020-10-16 ENCOUNTER — Other Ambulatory Visit: Payer: Self-pay

## 2020-10-16 VITALS — Temp 97.3°F

## 2020-10-16 DIAGNOSIS — H9221 Otorrhagia, right ear: Secondary | ICD-10-CM

## 2020-10-16 DIAGNOSIS — H6123 Impacted cerumen, bilateral: Secondary | ICD-10-CM | POA: Diagnosis not present

## 2020-10-16 DIAGNOSIS — H60311 Diffuse otitis externa, right ear: Secondary | ICD-10-CM

## 2020-10-16 NOTE — Progress Notes (Signed)
HPI: Ryan Zhang is a 74 y.o. male who returns today for evaluation of right ear bleeding.  This initially started about a week ago when he used a Q-tip in the ear because of sensation of fluid in the ear.  He got out some wax but then subsequently had some bleeding.  He was seen in the ED and treated with antibiotic eardrops and was instructed not to use his hearing aids.  The ear feels partially blocked but is not hurting.  He has had no significant pain in the ear. He has longstanding hearing loss in the left ear and partial hearing loss in the right ear and wears cross hearing aids.  Past Medical History:  Diagnosis Date  . Allergy    allergy shots Dr. Velora Heckler  . Anemia   . Anxiety   . Asthma    moderate persistant  . Bipolar affective ([place])   . Cataract    both eyes  . COPD (chronic obstructive pulmonary disease) ([place])   . Depression   . Diabetes mellitus   . DJD (degenerative joint disease)   . Dysphagia   . Gallstones    hx of, s/p cholecystectomy  . GERD (gastroesophageal reflux disease)   . Hepatitis A    as teenager 70's  . Hollenhorst plaque    right eye  . Hyperlipidemia   . Hypertension   . Kidney stones    hx of  . Meniere's disease   . Motility disorder, esophageal   . Obesity   . Pancreatitis   . Personal history of colonic polyps 11/2004   hyperplastic.  . Stroke [facility])    per MRI   Past Surgical History:  Procedure Laterality Date  . CATARACT EXTRACTION Bilateral 09/2015 and 10/2015  . CHOLECYSTECTOMY    . COLONOSCOPY    . TOTAL KNEE ARTHROPLASTY Bilateral    both knees   Social History   Socioeconomic History  . Marital status: Married    Spouse name: Not on file  . Number of children: 1  . Years of education: 1  . Highest education level: Bachelor's degree (e.g., BA, AB, BS)  Occupational History  . Occupation: disabled    Employer: DISABLED    Comment: laboratory computing/programming  Tobacco Use  . Smoking status: Never Smoker  .  Smokeless tobacco: Never Used  Substance and Sexual Activity  . Alcohol use: Not Currently  . Drug use: No  . Sexual activity: Not Currently  Other Topics Concern  . Not on file  Social History Narrative   Household-- pt , wife, adopted  daughter (h/o substance abuse,on a methadone program, doing better )   Social Determinants of Health   Financial Resource Strain: Low Risk   . Difficulty of Paying Living Expenses: Not hard at all  Food Insecurity: No Food Insecurity  . Worried About Charity fundraiser in the Last Year: Never true  . Ran Out of Food in the Last Year: Never true  Transportation Needs: No Transportation Needs  . Lack of Transportation (Medical): No  . Lack of Transportation (Non-Medical): No  Physical Activity:   . Days of Exercise per Week: Not on file  . Minutes of Exercise per Session: Not on file  Stress:   . Feeling of Stress : Not on file  Social Connections:   . Frequency of Communication with Friends and Family: Not on file  . Frequency of Social Gatherings with Friends and Family: Not on file  . Attends Religious  Services: Not on file  . Active Member of Clubs or Organizations: Not on file  . Attends Archivist Meetings: Not on file  . Marital Status: Not on file   Family History  Problem Relation Age of Onset  . Diabetes Father   . Heart disease Brother   . Heart disease Maternal Grandmother   . Cancer Sister        unknown type  . Colon cancer Neg Hx   . Prostate cancer Neg Hx   . Colon polyps Neg Hx   . Esophageal cancer Neg Hx   . Rectal cancer Neg Hx   . Stomach cancer Neg Hx    Allergies  Allergen Reactions  . Celexa  [Citalopram Hydrobromide] Other (See Comments)  . Depakote  [Divalproex Sodium] Other (See Comments)  . Methocarbamol     Other reaction(s): Hallucinations  . Paroxetine Hcl     Other reaction(s): Insomnia  . Smz-Tmp Ds [Sulfamethoxazole-Trimethoprim] Nausea And Vomiting  . Other     "Symbrax" alteration  in blood sugar, pt unsure if low or high blood sugar  . Percocet [Oxycodone-Acetaminophen] Other (See Comments)    Hyperactivity  . Risperidone Other (See Comments)    REACTION: hyper  . Wellbutrin [Bupropion] Other (See Comments)    Hot flashes   Prior to Admission medications   Medication Sig Start Date End Date Taking? Authorizing Provider  acetaminophen (TYLENOL) 325 MG tablet Take 2 tablets (650 mg total) by mouth every 6 (six) hours as needed (mild pain; headache). 02/06/20  Yes Georgette Shell, MD  albuterol (PROVENTIL) (2.5 MG/3ML) 0.083% nebulizer solution Take 3 mLs (2.5 mg total) by nebulization every 6 (six) hours as needed for wheezing or shortness of breath. 07/24/20  Yes Nche, Charlene Brooke, NP  albuterol (VENTOLIN HFA) 108 (90 Base) MCG/ACT inhaler Inhale 2 puffs into the lungs every 6 (six) hours as needed for wheezing or shortness of breath. 02/11/20  Yes Paz, Alda Berthold, MD  asenapine (SAPHRIS) 5 MG SUBL Place 5 mg under the tongue at bedtime.    Yes [provider]  augmented betamethasone dipropionate (DIPROLENE-AF) 0.05 % ointment Apply topically 2 (two) times daily as needed. 03/13/18  Yes Paz, Alda Berthold, MD  azelastine (ASTELIN) 0.1 % nasal spray Place 1 spray into both nostrils as needed for rhinitis. To replace Flonase   Yes [provider]  benzonatate (TESSALON) 100 MG capsule Take 1 capsule (100 mg total) by mouth 3 (three) times daily as needed for cough. 08/05/20  Yes Paz, Alda Berthold, MD  budesonide-formoterol [facility]) 160-4.5 MCG/ACT inhaler Inhale 2 puffs into the lungs 2 (two) times daily.    Yes [provider]  cefdinir (OMNICEF) 300 MG capsule Take 1 capsule (300 mg total) by mouth 2 (two) times daily for 7 days. 10/11/20 10/18/20 Yes Little, Wenda Overland, MD  clonazePAM (KLONOPIN) 0.5 MG tablet Take 0.5 mg by mouth See admin instructions. #2 at 8am, #1 at 2pm [facility])   Yes [provider]  clopidogrel (PLAVIX) 75 MG tablet Take 1  tablet (75 mg total) by mouth daily. 11/05/19  Yes Paz, Alda Berthold, MD  collagenase (SANTYL) ointment Apply 1 application topically daily. 06/04/20  Yes Paz, Alda Berthold, MD  doxycycline (VIBRAMYCIN) 100 MG capsule Take 1 capsule (100 mg total) by mouth 2 (two) times daily. 08/18/20  Yes Paz, Alda Berthold, MD  DULoxetine (CYMBALTA) 60 MG capsule Take 60 mg by mouth every morning.    Yes [provider]  EPIPEN 2-PAK 0.3 MG/0.3ML SOAJ injection Reported on 04/29/2016 07/04/14  Yes [provider]  esomeprazole (NEXIUM) 40 MG capsule Take 40 mg by mouth every morning.    Yes [provider]  fexofenadine (ALLEGRA) 180 MG tablet Take 180 mg by mouth every morning. 8am [name])   Yes [provider]  fluticasone (VERAMYST) 27.5 MCG/SPRAY nasal spray Place 2 sprays into the nose daily as needed for rhinitis or allergies.    Yes [provider]  HYDROcodone-homatropine (HYCODAN) 5-1.5 MG/5ML syrup Take 5 mLs by mouth at bedtime as needed for cough. 08/05/20  Yes Paz, Alda Berthold, MD  hydrocortisone 2.5 % cream Apply topically 2 (two) times daily. 08/05/20 08/05/21 Yes Paz, Alda Berthold, MD  lamoTRIgine (LAMICTAL) 25 MG tablet Take 50 mg by mouth at bedtime.   Yes [provider]  Lancets [name] ULTRASOFT) lancets Check blood sugars no more than twice daily 12/09/16  Yes Paz, Alda Berthold, MD  losartan (COZAAR) 25 MG tablet Take 1 tablet (25 mg total) by mouth every evening. 07/28/20  Yes Paz, Alda Berthold, MD  montelukast (SINGULAIR) 10 MG tablet Take 10 mg by mouth at bedtime. [name]   Yes [provider]  Multiple Vitamin (MULTIVITAMIN) tablet Take 1 tablet by mouth daily.    Yes [provider]  nystatin (MYCOSTATIN/NYSTOP) powder Apply 1 application topically 3 (three) times daily. 07/24/20  Yes Nche, Charlene Brooke, NP  [facility] VERIO test strip use to check blood sugar no more than twice daily 12/03/19  Yes Colon Branch, MD  pioglitazone-metformin (ACTOPLUS MET) [phone]  MG tablet TAKE ONE TABLET BY MOUTH TWICE A DAY WITH MEALS 08/20/20  Yes Colon Branch, MD  simvastatin (ZOCOR) 40 MG tablet Take 1 tablet (40 mg total) by mouth at bedtime. 01/14/20  Yes Paz, Alda Berthold, MD  solifenacin (VESICARE) 10 MG tablet Take 10 mg by mouth daily. 10/11/19  Yes [provider]  tamsulosin (FLOMAX) 0.4 MG CAPS capsule Take 1 capsule (0.4 mg total) by mouth daily after supper. 02/13/20  Yes Paz, Alda Berthold, MD  traZODone (DESYREL) 50 MG tablet Take 50 mg by mouth at bedtime as needed for sleep. [name] 07/09/14  Yes Midge Minium, MD     Positive ROS: Otherwise negative  All other systems have been reviewed and were otherwise negative with the exception of those mentioned in the HPI and as above.  Physical Exam: Constitutional: Alert, well-appearing, no acute distress Ears: External ears without lesions or tenderness.  He has wax buildup in the left ear canal that was removed in the office with forceps.  The right ear canal reveals some blood and slight inflammatory changes as well as an abrasion of the anterior bony canal wall bulge.  After cleaning the ear with hydroperoxide and alcohol I cauterized the anterior bleeding spot with silver nitrate.  I then applied gentian violet and Floxin eardrops. Nasal: External nose without lesions. . Clear nasal passages Oral: Lips and gums without lesions. Tongue and palate mucosa without lesions. Posterior oropharynx clear. Neck: No palpable adenopathy or masses Respiratory: Breathing comfortably  Skin: No facial/neck lesions or rash noted.  Cerumen impaction removal  Date/Time: 10/16/2020 4:28 PM Performed by: Rozetta Nunnery, MD Authorized by: Rozetta Nunnery, MD   Consent:    Consent obtained:  Verbal   Consent given by:  Patient   Risks discussed:  Pain and bleeding Procedure details:    Location:  L ear and R ear  Procedure type: suction and forceps   Post-procedure details:    Inspection:  TM intact and  canal normal   Hearing quality:  Improved   Patient tolerance of procedure:  Tolerated well, no immediate complications Comments:     Left ear canal left TM are clear.  Right ear canal reveals mild inflammatory changes and a bleeding site on the anterior bony canal wall bulge that was cauterized with silver nitrate.  I then applied gentian violet and Floxin drops to the right ear canal.    Assessment: Right external otitis with bleeding from abrasion of the anterior canal wall most likely from use of a Q-tip.  Plan: Recommended leaving the hearing aid out for the next 24 hours. I applied gentian violet and Floxin drops to the ear.  He still has some antibiotic eardrops that he be using earlier they recommended stopping these for now unless he has any pain or discomfort in the ear. He can start wearing his hearing aids tomorrow.   Radene Journey, MD

## 2020-10-17 ENCOUNTER — Ambulatory Visit (INDEPENDENT_AMBULATORY_CARE_PROVIDER_SITE_OTHER): Payer: Medicare Other | Admitting: Internal Medicine

## 2020-10-17 ENCOUNTER — Other Ambulatory Visit: Payer: Self-pay

## 2020-10-17 VITALS — BP 145/79 | HR 76 | Temp 98.0°F | Ht 70.0 in | Wt 253.0 lb

## 2020-10-17 DIAGNOSIS — E1142 Type 2 diabetes mellitus with diabetic polyneuropathy: Secondary | ICD-10-CM | POA: Diagnosis not present

## 2020-10-17 NOTE — Patient Instructions (Addendum)
Check the  blood pressure BP GOAL is between 110/65 and  135/85. If it is consistently higher or lower, let me know   GO TO THE LAB : Get the blood work     GO TO THE FRONT DESK, PLEASE SCHEDULE YOUR APPOINTMENTS Come back for a physical exam in 3 or 4 months   Diabetes Mellitus and Foot Care Foot care is an important part of your health, especially when you have diabetes. Diabetes may cause you to have problems because of poor blood flow (circulation) to your feet and legs, which can cause your skin to:  Become thinner and drier.  Break more easily.  Heal more slowly.  Peel and crack. You may also have nerve damage (neuropathy) in your legs and feet, causing decreased feeling in them. This means that you may not notice minor injuries to your feet that could lead to more serious problems. Noticing and addressing any potential problems early is the best way to prevent future foot problems. How to care for your feet Foot hygiene  Wash your feet daily with warm water and mild soap. Do not use hot water. Then, pat your feet and the areas between your toes until they are completely dry. Do not soak your feet as this can dry your skin.  Trim your toenails straight across. Do not dig under them or around the cuticle. File the edges of your nails with an emery board or nail file.  Apply a moisturizing lotion or petroleum jelly to the skin on your feet and to dry, brittle toenails. Use lotion that does not contain alcohol and is unscented. Do not apply lotion between your toes. Shoes and socks  Wear clean socks or stockings every day. Make sure they are not too tight. Do not wear knee-high stockings since they may decrease blood flow to your legs.  Wear shoes that fit properly and have enough cushioning. Always look in your shoes before you put them on to be sure there are no objects inside.  To break in new shoes, wear them for just a few hours a day. This prevents injuries on your  feet. Wounds, scrapes, corns, and calluses  Check your feet daily for blisters, cuts, bruises, sores, and redness. If you cannot see the bottom of your feet, use a mirror or ask someone for help.  Do not cut corns or calluses or try to remove them with medicine.  If you find a minor scrape, cut, or break in the skin on your feet, keep it and the skin around it clean and dry. You may clean these areas with mild soap and water. Do not clean the area with peroxide, alcohol, or iodine.  If you have a wound, scrape, corn, or callus on your foot, look at it several times a day to make sure it is healing and not infected. Check for: ? Redness, swelling, or pain. ? Fluid or blood. ? Warmth. ? Pus or a bad smell. General instructions  Do not cross your legs. This may decrease blood flow to your feet.  Do not use heating pads or hot water bottles on your feet. They may burn your skin. If you have lost feeling in your feet or legs, you may not know this is happening until it is too late.  Protect your feet from hot and cold by wearing shoes, such as at the beach or on hot pavement.  Schedule a complete foot exam at least once a year (annually) or more  often if you have foot problems. If you have foot problems, report any cuts, sores, or bruises to your health care provider immediately. Contact a health care provider if:  You have a medical condition that increases your risk of infection and you have any cuts, sores, or bruises on your feet.  You have an injury that is not healing.  You have redness on your legs or feet.  You feel burning or tingling in your legs or feet.  You have pain or cramps in your legs and feet.  Your legs or feet are numb.  Your feet always feel cold.  You have pain around a toenail. Get help right away if:  You have a wound, scrape, corn, or callus on your foot and: ? You have pain, swelling, or redness that gets worse. ? You have fluid or blood coming from  the wound, scrape, corn, or callus. ? Your wound, scrape, corn, or callus feels warm to the touch. ? You have pus or a bad smell coming from the wound, scrape, corn, or callus. ? You have a fever. ? You have a red line going up your leg. Summary  Check your feet every day for cuts, sores, red spots, swelling, and blisters.  Moisturize feet and legs daily.  Wear shoes that fit properly and have enough cushioning.  If you have foot problems, report any cuts, sores, or bruises to your health care provider immediately.  Schedule a complete foot exam at least once a year (annually) or more often if you have foot problems. This information is not intended to replace advice given to you by your health care provider. Make sure you discuss any questions you have with your health care provider. Document Revised: 07/25/2019 Document Reviewed: 12/03/2016 Elsevier Patient Education  2020 [employer].

## 2020-10-17 NOTE — Progress Notes (Signed)
Subjective:    Patient ID: Ryan Zhang, male    DOB: 14-Feb-1946, 74 y.o.   MRN: [id number]  DOS:  10/17/2020 Type of visit - description: f/u  Here with his wife. Feeling well. We talk about his diabetes and high blood pressure. Also vaccinations. Recently seen at the ER with a ear problem, saw ENT, all feels better. History of cellulitis: Completely resolved, the left pretibial skin still very thin and delicate according to the patient.   Review of Systems See above   Past Medical History:  Diagnosis Date  . Allergy    allergy shots Dr. Velora Heckler  . Anemia   . Anxiety   . Asthma    moderate persistant  . Bipolar affective ([place])   . Cataract    both eyes  . COPD (chronic obstructive pulmonary disease) ([place])   . Depression   . Diabetes mellitus   . DJD (degenerative joint disease)   . Dysphagia   . Gallstones    hx of, s/p cholecystectomy  . GERD (gastroesophageal reflux disease)   . Hepatitis A    as teenager 45's  . Hollenhorst plaque    right eye  . Hyperlipidemia   . Hypertension   . Kidney stones    hx of  . Meniere's disease   . Motility disorder, esophageal   . Obesity   . Pancreatitis   . Personal history of colonic polyps 11/2004   hyperplastic.  . Stroke [facility])    per MRI    Past Surgical History:  Procedure Laterality Date  . CATARACT EXTRACTION Bilateral 09/2015 and 10/2015  . CHOLECYSTECTOMY    . COLONOSCOPY    . TOTAL KNEE ARTHROPLASTY Bilateral    both knees    Allergies as of 10/17/2020      Reactions   Celexa  [citalopram Hydrobromide] Other (See Comments)   Depakote  [divalproex Sodium] Other (See Comments)   Methocarbamol    Other reaction(s): Hallucinations   Paroxetine Hcl    Other reaction(s): Insomnia   Smz-tmp Ds [sulfamethoxazole-trimethoprim] Nausea And Vomiting   Other    "Symbrax" alteration in blood sugar, pt unsure if low or high blood sugar   Percocet [oxycodone-acetaminophen] Other (See Comments)   Hyperactivity    Risperidone Other (See Comments)   REACTION: hyper   Wellbutrin [bupropion] Other (See Comments)   Hot flashes      Medication List       Accurate as of October 17, 2020 11:59 PM. If you have any questions, ask your nurse or doctor.        STOP taking these medications   benzonatate 100 MG capsule Commonly known as: TESSALON Stopped by: Kathlene November, MD   cefdinir 300 MG capsule Commonly known as: OMNICEF Stopped by: Kathlene November, MD   doxycycline 100 MG capsule Commonly known as: VIBRAMYCIN Stopped by: Kathlene November, MD   HYDROcodone-homatropine 5-1.5 MG/5ML syrup Commonly known as: HYCODAN Stopped by: Kathlene November, MD     TAKE these medications   acetaminophen 325 MG tablet Commonly known as: TYLENOL Take 2 tablets (650 mg total) by mouth every 6 (six) hours as needed (mild pain; headache).   albuterol 108 (90 Base) MCG/ACT inhaler Commonly known as: VENTOLIN HFA Inhale 2 puffs into the lungs every 6 (six) hours as needed for wheezing or shortness of breath.   albuterol (2.5 MG/3ML) 0.083% nebulizer solution Commonly known as: PROVENTIL Take 3 mLs (2.5 mg total) by nebulization every 6 (six) hours as needed for  wheezing or shortness of breath.   augmented betamethasone dipropionate 0.05 % ointment Commonly known as: DIPROLENE-AF Apply topically 2 (two) times daily as needed.   azelastine 0.1 % nasal spray Commonly known as: ASTELIN Place 1 spray into both nostrils as needed for rhinitis. To replace Flonase   budesonide-formoterol 160-4.5 MCG/ACT inhaler Commonly known as: SYMBICORT Inhale 2 puffs into the lungs 2 (two) times daily.   clonazePAM 0.5 MG tablet Commonly known as: KLONOPIN Take 0.5 mg by mouth See admin instructions. #2 at 8am, #1 at 2pm [facility])   clopidogrel 75 MG tablet Commonly known as: PLAVIX Take 1 tablet (75 mg total) by mouth daily.   Cymbalta 60 MG capsule Generic drug: DULoxetine Take 60 mg by mouth every morning.   EpiPen 2-Pak 0.3  mg/0.3 mL Soaj injection Generic drug: EPINEPHrine Reported on 04/29/2016   esomeprazole 40 MG capsule Commonly known as: NEXIUM Take 40 mg by mouth every morning.   fexofenadine 180 MG tablet Commonly known as: ALLEGRA Take 180 mg by mouth every morning. 8am [name])   hydrocortisone 2.5 % cream Apply topically 2 (two) times daily.   lamoTRIgine 25 MG tablet Commonly known as: LAMICTAL Take 50 mg by mouth at bedtime.   losartan 25 MG tablet Commonly known as: COZAAR Take 1 tablet (25 mg total) by mouth every evening.   montelukast 10 MG tablet Commonly known as: SINGULAIR Take 10 mg by mouth at bedtime. [name]   multivitamin tablet Take 1 tablet by mouth daily.   nystatin powder Commonly known as: MYCOSTATIN/NYSTOP Apply 1 application topically 3 (three) times daily.   onetouch ultrasoft lancets Check blood sugars no more than twice daily   OneTouch Verio test strip Generic drug: glucose blood use to check blood sugar no more than twice daily   pioglitazone-metformin 15-850 MG tablet Commonly known as: ACTOPLUS MET TAKE ONE TABLET BY MOUTH TWICE A DAY WITH MEALS   Santyl ointment Generic drug: collagenase Apply 1 application topically daily.   Saphris 5 MG Subl 24 hr tablet Generic drug: asenapine Place 5 mg under the tongue at bedtime.   simvastatin 40 MG tablet Commonly known as: ZOCOR Take 1 tablet (40 mg total) by mouth at bedtime.   solifenacin 10 MG tablet Commonly known as: VESICARE Take 10 mg by mouth daily.   tamsulosin 0.4 MG Caps capsule Commonly known as: FLOMAX Take 1 capsule (0.4 mg total) by mouth daily after supper.   traZODone 50 MG tablet Commonly known as: DESYREL Take 50 mg by mouth at bedtime as needed for sleep. [name]   Veramyst 27.5 MCG/SPRAY nasal spray Generic drug: fluticasone Place 2 sprays into the nose daily as needed for rhinitis or allergies.          Objective:   Physical Exam BP (!) 145/79 (BP  Location: Right Arm, Patient Position: Sitting, Cuff Size: Large)   Pulse 76   Temp 98 F (36.7 C) (Oral)   Ht 5' 10" (1.778 m)   Wt 253 lb (114.8 kg)   SpO2 98%   BMI 36.30 kg/m   General:   Well developed, NAD, BMI noted. HEENT:  Normocephalic . Face symmetric, atraumatic Lungs:  CTA B Normal respiratory effort, no intercostal retractions, no accessory muscle use. Heart: RRR,  no murmur. DM foot exam: No edema, good pedal pulses, toes with some preulcerative calluses, pinprick examination normal Skin: Left pretibial skin is not warm or red but very thin. Neurologic:  alert & oriented X3.  Speech  normal, gait appropriate for age and unassisted Psych--  Cognition and judgment appear intact.  Cooperative with normal attention span and concentration.  Behavior appropriate. No anxious or depressed appearing.      Assessment     Assessment DM HTN Hyperlipidemia Morbid obesity (BMI 39 plus DM) Bipolar, Depression ------ see psychiatry, Dr Casimiro Needle DJD-- hydrocodone rarely rx by [place] ortho Asthma-Allergies ------------ Dr [name]  GERD, h/o dysphagia (chronic, neurogenic-transfer dysphagia? See GI note 12-2011) Neuro: --? stroke : saw neuro 2013 d/t B transient visual loss, ASA changed to plavix.  --See CTA, MRIs of head-neck report  --Saw neuro 11-2013: fall, syncope,parkinsonism d/t sapharis? CV: Enlarged ascending Ao  --per CT 12-2015,CT chest 08-2016 stable.    --MRI chest 10/2017 stable, no further routine x-rays recommended --CT chest 07/2018: Atherosclerotic changes in the thoracic aorta.  Nl LE arterial dopplers 2015 Carotid artery disease: Per ultrasound 2011, last [place] 2018: <50% B, rx medical treatment, recheck 08/2019 < 1-39% B, stable 2 years  CT chest  ---RML  65m: 07-2014, CT 12-2015, CT 08-2016, MRI chest 12,2018, CT chest 07/2018:Stable  HOH H/o Mnire's disease  Iron deficiency anemia: Noted 01/2018, + Hemoccult, 09-2018: Colonoscopy and EGD were completely  normal   PLAN: DM: Last A1c 7.1, recheck A1c. Neuropathy: Reports numbness at the bottom of his feet, pinprick examination normal, he does have some preulcerative calluses. Form for diabetic shoes signed, feet care discussed.. Left leg cellulitis: Completely resolved, left pretibial skin is very thin, care discussed. RTC CPX about 3 months    This visit occurred during the SARS-CoV-2 public health emergency.  Safety protocols were in place, including screening questions prior to the visit, additional usage of staff PPE, and extensive cleaning of exam room while observing appropriate contact time as indicated for disinfecting solutions.

## 2020-10-18 LAB — HEMOGLOBIN A1C
Hgb A1c MFr Bld: 6.5 % of total Hgb — ABNORMAL HIGH (ref <5.7)
Mean Plasma Glucose: 140 (calc)
eAG (mmol/L): 7.7 (calc)

## 2020-10-19 NOTE — Assessment & Plan Note (Signed)
DM: Last A1c 7.1, recheck A1c. Neuropathy: Reports numbness at the bottom of his feet, pinprick examination normal, he does have some preulcerative calluses. Form for diabetic shoes signed, feet care discussed.. Left leg cellulitis: Completely resolved, left pretibial skin is very thin, care discussed. RTC CPX about 3 months

## 2020-10-20 ENCOUNTER — Telehealth: Payer: Self-pay | Admitting: Internal Medicine

## 2020-10-20 NOTE — Telephone Encounter (Signed)
Form faxed 10/17/2020. Received confirmation.

## 2020-10-20 NOTE — Telephone Encounter (Signed)
Received by fax document to be filled out (2 pages - [employer] for Therapeutic Shoes) Document put at front office tray under providers name.

## 2020-10-21 DIAGNOSIS — J301 Allergic rhinitis due to pollen: Secondary | ICD-10-CM | POA: Diagnosis not present

## 2020-10-21 DIAGNOSIS — J3089 Other allergic rhinitis: Secondary | ICD-10-CM | POA: Diagnosis not present

## 2020-10-23 ENCOUNTER — Other Ambulatory Visit: Payer: Self-pay | Admitting: Internal Medicine

## 2020-10-27 ENCOUNTER — Ambulatory Visit: Payer: Medicare Other | Admitting: Pharmacist

## 2020-10-27 DIAGNOSIS — I10 Essential (primary) hypertension: Secondary | ICD-10-CM

## 2020-10-27 DIAGNOSIS — E1142 Type 2 diabetes mellitus with diabetic polyneuropathy: Secondary | ICD-10-CM

## 2020-10-27 DIAGNOSIS — J45909 Unspecified asthma, uncomplicated: Secondary | ICD-10-CM

## 2020-10-27 NOTE — Patient Instructions (Addendum)
Visit Information  Goals Addressed            This Visit's Progress   . Chronic Care Mangement Pharmacy Care Plan       CARE PLAN ENTRY (see longitudinal plan of care for additional care plan information)  Current Barriers:  . Chronic Disease Management support, education, and care coordination needs related to Asthma, Diabetes, Hypertension, Hyperlipidemia, Bipolar Disorder, GERD, Allergic Rhinitis, Overactive Bladder, BPH, Insomnia   Hypertension BP Readings from Last 3 Encounters:  10/17/20 (!) 145/79  10/11/20 133/67  09/11/20 125/80   . Pharmacist Clinical Goal(s): o Over the next 90 days, patient will work with PharmD and providers to maintain BP goal <140/90 . Current regimen:  o Losartan 25mg  daily . Interventions: o Requested that check his BP 2-3 times per week . Patient self care activities - Over the next 90 days, patient will: o Check BP 2-3 times per week, document, and provide at future appointments o Ensure daily salt intake < 2300 mg/Ryan Zhang  Hyperlipidemia Lab Results  Component Value Date/Time   LDLCALC 38 01/10/2020 11:03 AM   . Pharmacist Clinical Goal(s): o Over the next 90 days, patient will work with PharmD and providers to maintain LDL goal < 70 . Current regimen:  o Simvastatin 40mg  daily . Patient self care activities - Over the next 90 days, patient will: o Maintain cholesterol medication regimen.   Diabetes Lab Results  Component Value Date/Time   HGBA1C 6.5 (H) 10/17/2020 03:33 PM   HGBA1C 7.1 (H) 08/05/2020 02:26 PM   . Pharmacist Clinical Goal(s): o Over the next 90 days, patient will work with PharmD and providers to maintain A1c goal <7% . Current regimen:  o Pioglitazone-metformin 15-850mg  twice daily  . Interventions: o Discussed signs/symptoms of hypoglycemia and how to treat them . Patient self care activities - Over the next 90 days, patient will: o Maintain a1c less than 7%  Asthma . Pharmacist Clinical Goal(s) o Over the  next 180 days, patient will work with PharmD and providers to reduce symptoms of asthma . Current regimen:  o Symbicort 160-4.5 2 puffs BID o Albuterol neb PRN o Albuterol inhaler PRN . Interventions: o Discuss possibility of Trelegy replacing Symbicort to help with symptom improvement (Dr. 14/01/2020 prefers symbicort) . Patient self care activities - Over the next 180 days, patient will: o Maintain Asthma inhaler regimen  Medication management . Pharmacist Clinical Goal(s): o Over the next 90 days, patient will work with PharmD and providers to maintain optimal medication adherence . Current pharmacy: [facility] . Interventions o Comprehensive medication review performed. o Continue current medication management strategy . Patient self care activities - Over the next 90 days, patient will: o Focus on medication adherence by filling and taking medications appropriately o Take medications as prescribed o Report any questions or concerns to PharmD and/or provider(s)  Please see past updates related to this goal by clicking on the "Past Updates" button in the selected goal          The patient verbalized understanding of instructions, educational materials, and care plan provided today and agreed to receive a mailed copy of patient instructions, educational materials, and care plan.   Telephone follow up appointment with pharmacy team member scheduled for: 04/27/2021  Ryan Zhang Ryan Zhang, [facility]
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
## 2020-10-29 DIAGNOSIS — J301 Allergic rhinitis due to pollen: Secondary | ICD-10-CM | POA: Diagnosis not present

## 2020-10-29 DIAGNOSIS — J3089 Other allergic rhinitis: Secondary | ICD-10-CM | POA: Diagnosis not present

## 2020-11-04 DIAGNOSIS — J301 Allergic rhinitis due to pollen: Secondary | ICD-10-CM | POA: Diagnosis not present

## 2020-11-04 DIAGNOSIS — J3089 Other allergic rhinitis: Secondary | ICD-10-CM | POA: Diagnosis not present

## 2020-11-12 DIAGNOSIS — J3089 Other allergic rhinitis: Secondary | ICD-10-CM | POA: Diagnosis not present

## 2020-11-12 DIAGNOSIS — J301 Allergic rhinitis due to pollen: Secondary | ICD-10-CM | POA: Diagnosis not present

## 2020-11-15 ENCOUNTER — Other Ambulatory Visit: Payer: Self-pay | Admitting: Internal Medicine

## 2020-11-19 DIAGNOSIS — J3081 Allergic rhinitis due to animal (cat) (dog) hair and dander: Secondary | ICD-10-CM | POA: Diagnosis not present

## 2020-11-19 DIAGNOSIS — J3089 Other allergic rhinitis: Secondary | ICD-10-CM | POA: Diagnosis not present

## 2020-11-19 DIAGNOSIS — J301 Allergic rhinitis due to pollen: Secondary | ICD-10-CM | POA: Diagnosis not present

## 2020-11-26 ENCOUNTER — Other Ambulatory Visit: Payer: Self-pay | Admitting: Internal Medicine

## 2020-11-26 ENCOUNTER — Encounter: Payer: Self-pay | Admitting: Internal Medicine

## 2020-11-26 DIAGNOSIS — J301 Allergic rhinitis due to pollen: Secondary | ICD-10-CM | POA: Diagnosis not present

## 2020-11-26 DIAGNOSIS — J3089 Other allergic rhinitis: Secondary | ICD-10-CM | POA: Diagnosis not present

## 2020-11-26 MED ORDER — SOLIFENACIN SUCCINATE 10 MG PO TABS
10.0000 mg | ORAL_TABLET | Freq: Every day | ORAL | 1 refills | Status: DC
Start: 2020-11-26 — End: 2024-01-10

## 2020-12-03 DIAGNOSIS — J3089 Other allergic rhinitis: Secondary | ICD-10-CM | POA: Diagnosis not present

## 2020-12-03 DIAGNOSIS — J301 Allergic rhinitis due to pollen: Secondary | ICD-10-CM | POA: Diagnosis not present

## 2020-12-10 DIAGNOSIS — J3081 Allergic rhinitis due to animal (cat) (dog) hair and dander: Secondary | ICD-10-CM | POA: Diagnosis not present

## 2020-12-10 DIAGNOSIS — J301 Allergic rhinitis due to pollen: Secondary | ICD-10-CM | POA: Diagnosis not present

## 2020-12-10 DIAGNOSIS — J3089 Other allergic rhinitis: Secondary | ICD-10-CM | POA: Diagnosis not present

## 2020-12-11 DIAGNOSIS — R3915 Urgency of urination: Secondary | ICD-10-CM | POA: Diagnosis not present

## 2020-12-12 ENCOUNTER — Telehealth: Payer: Self-pay | Admitting: Internal Medicine

## 2020-12-12 NOTE — Telephone Encounter (Signed)
Fax failed x2. Called [employer], spoke w/ [place]. Alternate fax number [phone].

## 2020-12-12 NOTE — Telephone Encounter (Signed)
Received and was faxed back to them on 10/17/2020. I will refax the form now.

## 2020-12-12 NOTE — Telephone Encounter (Signed)
Caller: [employer]) Call back # [phone]  Edyth Gunnels like to know if you received a fax for therapeutic shoes and inserts

## 2020-12-12 NOTE — Telephone Encounter (Signed)
Form resigned and faxed back to [employer].

## 2020-12-12 NOTE — Telephone Encounter (Signed)
Received call back from [place]- form incomplete- they need second signature on the bottom of the form. Dr. Drue Novel has X'ed out that area of the form. They will fax over another form.

## 2020-12-12 NOTE — Telephone Encounter (Signed)
Received fax confirmation

## 2020-12-18 DIAGNOSIS — J3089 Other allergic rhinitis: Secondary | ICD-10-CM | POA: Diagnosis not present

## 2020-12-18 DIAGNOSIS — J301 Allergic rhinitis due to pollen: Secondary | ICD-10-CM | POA: Diagnosis not present

## 2020-12-18 DIAGNOSIS — J3081 Allergic rhinitis due to animal (cat) (dog) hair and dander: Secondary | ICD-10-CM | POA: Diagnosis not present

## 2020-12-22 ENCOUNTER — Encounter: Payer: Self-pay | Admitting: Internal Medicine

## 2020-12-23 ENCOUNTER — Other Ambulatory Visit: Payer: Self-pay

## 2020-12-23 ENCOUNTER — Ambulatory Visit (INDEPENDENT_AMBULATORY_CARE_PROVIDER_SITE_OTHER): Payer: Medicare Other | Admitting: Internal Medicine

## 2020-12-23 VITALS — BP 120/79 | HR 83 | Temp 97.7°F | Ht 70.0 in | Wt 266.4 lb

## 2020-12-23 DIAGNOSIS — H6690 Otitis media, unspecified, unspecified ear: Secondary | ICD-10-CM

## 2020-12-23 MED ORDER — NEOMYCIN-POLYMYXIN-HC 1 % OT SOLN: 3.0000 [drp] | Freq: Four times a day (QID) | OTIC | 0 refills | Status: DC

## 2020-12-23 MED ORDER — AMOXICILLIN-POT CLAVULANATE 875-125 MG PO TABS: 1.0000 | ORAL_TABLET | Freq: Two times a day (BID) | ORAL | 0 refills | Status: DC

## 2020-12-23 NOTE — Patient Instructions (Signed)
Use the drops as prescribed for 1 week  Use antibiotic by mouth as prescribed for 1 week  If you are not gradually better definitely call me  If you have severe pain, swelling, discharge: Go to the ER
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
## 2020-12-24 DIAGNOSIS — J301 Allergic rhinitis due to pollen: Secondary | ICD-10-CM | POA: Diagnosis not present

## 2020-12-24 DIAGNOSIS — J3089 Other allergic rhinitis: Secondary | ICD-10-CM | POA: Diagnosis not present

## 2020-12-24 NOTE — Assessment & Plan Note (Signed)
Ear infection: External ear infection, minimal if any canal infection, patient has diabetes. Plan: Will cover with Augmentin for 1 week and antibiotic eardrops. Recommend to call me if not gradually better. Definitely go to the ER if he is worse, noticed external swelling, has fever chills or purulent discharge Bipolar depression: PHQ-9 today is very high at 18 but he is at baseline

## 2020-12-31 DIAGNOSIS — J3089 Other allergic rhinitis: Secondary | ICD-10-CM | POA: Diagnosis not present

## 2020-12-31 DIAGNOSIS — J301 Allergic rhinitis due to pollen: Secondary | ICD-10-CM | POA: Diagnosis not present

## 2021-01-03 DIAGNOSIS — J301 Allergic rhinitis due to pollen: Secondary | ICD-10-CM | POA: Diagnosis not present

## 2021-01-03 DIAGNOSIS — J3089 Other allergic rhinitis: Secondary | ICD-10-CM | POA: Diagnosis not present

## 2021-01-07 DIAGNOSIS — J3089 Other allergic rhinitis: Secondary | ICD-10-CM | POA: Diagnosis not present

## 2021-01-07 DIAGNOSIS — J301 Allergic rhinitis due to pollen: Secondary | ICD-10-CM | POA: Diagnosis not present

## 2021-01-08 ENCOUNTER — Telehealth: Payer: Self-pay | Admitting: Pharmacist

## 2021-01-08 NOTE — Progress Notes (Addendum)
Chronic Care Management Pharmacy Assistant   Name: Ryan Zhang  MRN: [id number] DOB: 05/18/1946  Reason for Encounter: General Disease State  Patient Questions:  1.  Have you seen any other providers since your last visit? Yes  2.  Any changes in your medicines or health? Yes     PCP : Colon Branch, MD   Their chronic conditions include: Asthma, Diabetes, Hypertension, Hyperlipidemia, Bipolar Disorder, GERD, Allergic Rhinitis, Overactive Bladder, BPH, Insomnia.  Office Visits: 12-23-2020 (PCP) Patient presented in the office c/o right ear pain x 4 days. Prescribed CORTISPORIN QID drops and AUGMENTIN 875 mg BID to use for one week. Advised to contact PCP if symptoms persist without relief.  Consults: None since their last CCM visit with the clinical pharmacist,   Allergies:   Allergies  Allergen Reactions   Celexa  [Citalopram Hydrobromide] Other (See Comments)   Depakote  [Divalproex Sodium] Other (See Comments)   Methocarbamol     Other reaction(s): Hallucinations   Paroxetine Hcl     Other reaction(s): Insomnia   Smz-Tmp Ds [Sulfamethoxazole-Trimethoprim] Nausea And Vomiting   Other     "Symbrax" alteration in blood sugar, pt unsure if low or high blood sugar   Percocet [Oxycodone-Acetaminophen] Other (See Comments)    Hyperactivity   Risperidone Other (See Comments)    REACTION: hyper   Wellbutrin [Bupropion] Other (See Comments)    Hot flashes    Medications: Outpatient Encounter Medications as of 01/08/2021  Medication Sig Note   acetaminophen (TYLENOL) 325 MG tablet Take 2 tablets (650 mg total) by mouth every 6 (six) hours as needed (mild pain; headache).    albuterol (PROVENTIL) (2.5 MG/3ML) 0.083% nebulizer solution Take 3 mLs (2.5 mg total) by nebulization every 6 (six) hours as needed for wheezing or shortness of breath.    albuterol (VENTOLIN HFA) 108 (90 Base) MCG/ACT inhaler Inhale 2 puffs into the lungs every 6 (six) hours as needed for wheezing or  shortness of breath.    amoxicillin-clavulanate (AUGMENTIN) 875-125 MG tablet Take 1 tablet by mouth 2 (two) times daily.    asenapine (SAPHRIS) 5 MG SUBL 24 hr tablet Place 5 mg under the tongue at bedtime.    augmented betamethasone dipropionate (DIPROLENE-AF) 0.05 % ointment Apply topically 2 (two) times daily as needed.    azelastine (ASTELIN) 0.1 % nasal spray Place 1 spray into both nostrils as needed for rhinitis. To replace Flonase    budesonide-formoterol (SYMBICORT) 160-4.5 MCG/ACT inhaler Inhale 2 puffs into the lungs 2 (two) times daily.     clonazePAM (KLONOPIN) 0.5 MG tablet Take 0.5 mg by mouth See admin instructions. #2 at 8am, #1 at 2pm [facility])    clopidogrel (PLAVIX) 75 MG tablet Take 1 tablet (75 mg total) by mouth daily.    collagenase (SANTYL) ointment Apply 1 application topically daily.    DULoxetine (CYMBALTA) 60 MG capsule Take 60 mg by mouth every morning.    EPIPEN 2-PAK 0.3 MG/0.3ML SOAJ injection Reported on 04/29/2016 01/01/2019: PRN   esomeprazole (NEXIUM) 40 MG capsule Take 40 mg by mouth every morning.     fexofenadine (ALLEGRA) 180 MG tablet Take 180 mg by mouth every morning. 8am [name])    fluticasone (VERAMYST) 27.5 MCG/SPRAY nasal spray Place 2 sprays into the nose daily as needed for rhinitis or allergies.  02/26/2020: PRN [name])   hydrocortisone 2.5 % cream Apply topically 2 (two) times daily.    lamoTRIgine (LAMICTAL) 25 MG tablet Take 50 mg  by mouth at bedtime.    Lancets (ONETOUCH ULTRASOFT) lancets Check blood sugars no more than twice daily    losartan (COZAAR) 25 MG tablet Take 1 tablet (25 mg total) by mouth every evening.    montelukast (SINGULAIR) 10 MG tablet Take 10 mg by mouth at bedtime. [name]    Multiple Vitamin (MULTIVITAMIN) tablet Take 1 tablet by mouth daily.     NEOMYCIN-POLYMYXIN-HYDROCORTISONE (CORTISPORIN) 1 % SOLN OTIC solution Place 3 drops into the right ear 4 (four) times daily.    nystatin (MYCOSTATIN/NYSTOP)  powder Apply 1 application topically 3 (three) times daily.    ONETOUCH VERIO test strip use to check blood sugar no more than twice daily    pioglitazone-metformin (ACTOPLUS MET) 15-850 MG tablet TAKE ONE TABLET BY MOUTH TWICE A DAY WITH MEALS    simvastatin (ZOCOR) 40 MG tablet Take 1 tablet (40 mg total) by mouth at bedtime.    solifenacin (VESICARE) 10 MG tablet Take 1 tablet (10 mg total) by mouth daily.    tamsulosin (FLOMAX) 0.4 MG CAPS capsule Take 1 capsule (0.4 mg total) by mouth daily after supper.    traZODone (DESYREL) 50 MG tablet Take 50 mg by mouth at bedtime as needed for sleep. [name] 02/26/2020: Takes 131m PRN (about 3 times per year)   No facility-administered encounter medications on file as of 01/08/2021.    Current Diagnosis: Patient Active Problem List   Diagnosis Date Noted   Tinea cruris 07/24/2020   Acute respiratory failure with hypoxia ([facility] 07/18/2018   PCP NOTES >>>>>>>>>>>>>>>>>>>>>>>>>>>>>>> 01/12/2016   Dry skin 062/37/6283  Folliculitis 015/17/6160  Hearing loss in right ear 08/05/2014   Pre-ulcerative corn or callous 12/27/2013   Panic attack as reaction to stress 04/20/2012   Dysphagia, neurologic 01/07/2012   Morbid obesity ([facility] 01/07/2012   Chest pain, musculoskeletal 12/07/2011   Black-out (not amnesia) 12/07/2011   Candidiasis of skin 08/12/2011   Annual physical exam 06/07/2011   OLECRANON BURSITIS 01/29/2011   PARTIAL ARTERIAL OCCLUSION OF RETINA 05/27/2010   Essential hypertension, benign 01/16/2010   ABNORMAL ELECTROCARDIOGRAM 01/14/2010   Asthma 10/14/2009   GERD 10/14/2009   Bipolar disorder ([facility] 02/20/2009   PHIMOSIS 02/20/2009   KNEE PAIN, CHRONIC 11/20/2008   HIP PAIN, RIGHT 08/19/2008   DM type 2 with diabetic peripheral neuropathy ([facility] 02/22/2007   Hyperlipidemia associated with type 2 diabetes mellitus ([facility] 02/22/2007   ALLERGIC RHINITIS, SEASONAL 02/22/2007   LOW BACK PAIN, CHRONIC 02/22/2007   MENIERE'S DISEASE, HX  OF 02/22/2007    Goals Addressed   None    Made contact with the patient for a General Adherence touch point. Patient stated he was doing well with no complaints. He has completed all antibiotics prescribed to him by Dr.Paz for an ear infection. Mr. WDetienneshared with me losing hearing in one of his ears years ago from an untreated ear infection, therefore he is careful to follow-up quickly with a physician for any ear pain or discomfort. There have not been any changes in STAR metric medications. Patient reports being compliant with all meds and does not have any medication needs.   Follow-Up:  Pharmacist Review   IFanny Skates CHendrixPharmacist Assistant [phone] 3 minutes spent in review, coordination, and documentation.  Reviewed by: CBeverly Milch PharmD Clinical Pharmacist [facility]icine [phone]

## 2021-01-13 DIAGNOSIS — J3089 Other allergic rhinitis: Secondary | ICD-10-CM | POA: Diagnosis not present

## 2021-01-13 DIAGNOSIS — J301 Allergic rhinitis due to pollen: Secondary | ICD-10-CM | POA: Diagnosis not present

## 2021-01-21 DIAGNOSIS — J301 Allergic rhinitis due to pollen: Secondary | ICD-10-CM | POA: Diagnosis not present

## 2021-01-21 DIAGNOSIS — J3089 Other allergic rhinitis: Secondary | ICD-10-CM | POA: Diagnosis not present

## 2021-01-28 DIAGNOSIS — J3089 Other allergic rhinitis: Secondary | ICD-10-CM | POA: Diagnosis not present

## 2021-01-28 DIAGNOSIS — J301 Allergic rhinitis due to pollen: Secondary | ICD-10-CM | POA: Diagnosis not present

## 2021-02-07 ENCOUNTER — Other Ambulatory Visit: Payer: Self-pay | Admitting: Internal Medicine

## 2021-02-09 ENCOUNTER — Encounter: Payer: Self-pay | Admitting: Internal Medicine

## 2021-02-09 ENCOUNTER — Telehealth (INDEPENDENT_AMBULATORY_CARE_PROVIDER_SITE_OTHER): Payer: Medicare Other | Admitting: Internal Medicine

## 2021-02-09 VITALS — BP 157/82 | HR 94 | Temp 97.3°F | Wt 256.0 lb

## 2021-02-09 DIAGNOSIS — F3178 Bipolar disorder, in full remission, most recent episode mixed: Secondary | ICD-10-CM | POA: Diagnosis not present

## 2021-02-09 DIAGNOSIS — U071 COVID-19: Secondary | ICD-10-CM | POA: Diagnosis not present

## 2021-02-09 MED ORDER — BENZONATATE 200 MG PO CAPS: 200.0000 mg | ORAL_CAPSULE | Freq: Three times a day (TID) | ORAL | 0 refills | Status: DC | PRN

## 2021-02-09 NOTE — Progress Notes (Signed)
Subjective:    Patient ID: Ryan Zhang, male    DOB: [birth year], 75 y.o.   MRN: [id number]  DOS:  02/09/2021 Type of visit - description: Virtual Visit via Video Note  I connected with the above patient  by a video enabled telemedicine application and verified that I am speaking with the correct person using two identifiers.   THIS ENCOUNTER IS A VIRTUAL VISIT DUE TO COVID-19 - PATIENT WAS NOT SEEN IN THE OFFICE. PATIENT HAS CONSENTED TO VIRTUAL VISIT / TELEMEDICINE VISIT   Location of patient: home  Location of provider: office  Persons participating in the virtual visit: patient, provider   I discussed the limitations of evaluation and management by telemedicine and the availability of in person appointments. The patient expressed understanding and agreed to proceed.  Acute Symptoms a started 02/03/2021: Develop a classic URI with sinus congestion, postnasal dripping. A couple of days later he developed a cough, it is getting more frequent but is not severe. + Clear sputum. Has not taking any medications. Just in case he took a home Covid test today and it came back positive  Overall he feels fine other than the cough. No fever chills No nausea or vomiting No myalgias Had a sinus headache which is now better No chest pain no difficulty breathing.   Review of Systems See above   Past Medical History:  Diagnosis Date  . Allergy    allergy shots Dr. Velora Heckler  . Anemia   . Anxiety   . Asthma    moderate persistant  . Bipolar affective ([place])   . Cataract    both eyes  . COPD (chronic obstructive pulmonary disease) ([place])   . Depression   . Diabetes mellitus   . DJD (degenerative joint disease)   . Dysphagia   . Gallstones    hx of, s/p cholecystectomy  . GERD (gastroesophageal reflux disease)   . Hepatitis A    as teenager 20's  . Hollenhorst plaque    right eye  . Hyperlipidemia   . Hypertension   . Kidney stones    hx of  . Meniere's disease   .  Motility disorder, esophageal   . Obesity   . Pancreatitis   . Personal history of colonic polyps 11/2004   hyperplastic.  . Stroke [facility])    per MRI    Past Surgical History:  Procedure Laterality Date  . CATARACT EXTRACTION Bilateral 09/2015 and 10/2015  . CHOLECYSTECTOMY    . COLONOSCOPY    . TOTAL KNEE ARTHROPLASTY Bilateral    both knees    Allergies as of 02/09/2021      Reactions   Celexa  [citalopram Hydrobromide] Other (See Comments)   Depakote  [divalproex Sodium] Other (See Comments)   Methocarbamol    Other reaction(s): Hallucinations   Paroxetine Hcl    Other reaction(s): Insomnia   Smz-tmp Ds [sulfamethoxazole-trimethoprim] Nausea And Vomiting   Other    "Symbrax" alteration in blood sugar, pt unsure if low or high blood sugar   Percocet [oxycodone-acetaminophen] Other (See Comments)   Hyperactivity   Risperidone Other (See Comments)   REACTION: hyper   Wellbutrin [bupropion] Other (See Comments)   Hot flashes      Medication List       Accurate as of February 09, 2021  3:32 PM. If you have any questions, ask your nurse or doctor.        acetaminophen 325 MG tablet Commonly known as: TYLENOL Take 2 tablets (  650 mg total) by mouth every 6 (six) hours as needed (mild pain; headache).   albuterol 108 (90 Base) MCG/ACT inhaler Commonly known as: VENTOLIN HFA Inhale 2 puffs into the lungs every 6 (six) hours as needed for wheezing or shortness of breath.   albuterol (2.5 MG/3ML) 0.083% nebulizer solution Commonly known as: PROVENTIL Take 3 mLs (2.5 mg total) by nebulization every 6 (six) hours as needed for wheezing or shortness of breath.   amoxicillin-clavulanate 875-125 MG tablet Commonly known as: Augmentin Take 1 tablet by mouth 2 (two) times daily.   asenapine 5 MG Subl 24 hr tablet Commonly known as: SAPHRIS Place 5 mg under the tongue at bedtime.   augmented betamethasone dipropionate 0.05 % ointment Commonly known as: DIPROLENE-AF Apply  topically 2 (two) times daily as needed.   azelastine 0.1 % nasal spray Commonly known as: ASTELIN Place 1 spray into both nostrils as needed for rhinitis. To replace Flonase   budesonide-formoterol 160-4.5 MCG/ACT inhaler Commonly known as: SYMBICORT Inhale 2 puffs into the lungs 2 (two) times daily.   buPROPion 150 MG 24 hr tablet Commonly known as: WELLBUTRIN XL Take 150 mg by mouth every morning.   clonazePAM 0.5 MG tablet Commonly known as: KLONOPIN Take 0.5 mg by mouth See admin instructions. #2 at 8am, #1 at 2pm [facility])   clopidogrel 75 MG tablet Commonly known as: PLAVIX Take 1 tablet (75 mg total) by mouth daily.   DULoxetine 60 MG capsule Commonly known as: CYMBALTA Take 60 mg by mouth every morning.   EpiPen 2-Pak 0.3 mg/0.3 mL Soaj injection Generic drug: EPINEPHrine Reported on 04/29/2016   esomeprazole 40 MG capsule Commonly known as: NEXIUM Take 40 mg by mouth every morning.   fexofenadine 180 MG tablet Commonly known as: ALLEGRA Take 180 mg by mouth every morning. 8am [name])   fluticasone 27.5 MCG/SPRAY nasal spray Commonly known as: VERAMYST Place 2 sprays into the nose daily as needed for rhinitis or allergies.   hydrocortisone 2.5 % cream Apply topically 2 (two) times daily.   lamoTRIgine 25 MG tablet Commonly known as: LAMICTAL Take 50 mg by mouth at bedtime.   losartan 25 MG tablet Commonly known as: COZAAR Take 1 tablet (25 mg total) by mouth every evening. What changed: additional instructions   montelukast 10 MG tablet Commonly known as: SINGULAIR Take 10 mg by mouth at bedtime. [name]   multivitamin tablet Take 1 tablet by mouth daily.   NEOMYCIN-POLYMYXIN-HYDROCORTISONE 1 % Soln OTIC solution Commonly known as: CORTISPORIN Place 3 drops into the right ear 4 (four) times daily.   nystatin powder Commonly known as: MYCOSTATIN/NYSTOP Apply 1 application topically 3 (three) times daily.   onetouch ultrasoft  lancets Check blood sugars no more than twice daily   OneTouch Verio test strip Generic drug: glucose blood use to check blood sugar no more than twice daily   pioglitazone-metformin 15-850 MG tablet Commonly known as: ACTOPLUS MET TAKE ONE TABLET BY MOUTH TWICE A DAY WITH MEALS   Santyl ointment Generic drug: collagenase Apply 1 application topically daily.   simvastatin 40 MG tablet Commonly known as: ZOCOR TAKE ONE TABLET BY MOUTH DAILY AT BEDTIME   solifenacin 10 MG tablet Commonly known as: VESICARE Take 1 tablet (10 mg total) by mouth daily.   tamsulosin 0.4 MG Caps capsule Commonly known as: FLOMAX Take 1 capsule (0.4 mg total) by mouth daily after supper.   traZODone 50 MG tablet Commonly known as: DESYREL Take 50 mg by  mouth at bedtime as needed for sleep. [name]          Objective:   Physical Exam BP (!) 157/82 (BP Location: Left Arm, Patient Position: Sitting, Cuff Size: Large)   Pulse 94   Temp (!) 97.3 F (36.3 C) (Oral)   Wt 256 lb (116.1 kg)   SpO2 95%   BMI 36.73 kg/m  This is a virtual video visit, alert oriented x3, in no distress, speaking in complete sentences.    Assessment   Assessment DM HTN Hyperlipidemia Morbid obesity (BMI 39 plus DM) Bipolar, Depression ------ see psychiatry, Dr Casimiro Needle DJD-- hydrocodone rarely rx by [place] ortho Asthma-Allergies ------------ Dr [name]  GERD, h/o dysphagia (chronic, neurogenic-transfer dysphagia? See GI note 12-2011) Neuro: --? stroke : saw neuro 2013 d/t B transient visual loss, ASA changed to plavix.  --See CTA, MRIs of head-neck report  --Saw neuro 11-2013: fall, syncope,parkinsonism d/t sapharis? CV: Enlarged ascending Ao  --per CT 12-2015,CT chest 08-2016 stable.    --MRI chest 10/2017 stable, no further routine x-rays recommended --CT chest 07/2018: Atherosclerotic changes in the thoracic aorta.  Nl LE arterial dopplers 2015 Carotid artery disease: Per ultrasound 2011, last [place] 2018:  <50% B, rx medical treatment, recheck 08/2019 < 1-39% B, stable 2 years  CT chest  ---RML  85m: 07-2014, CT 12-2015, CT 08-2016, MRI chest 12,2018, CT chest 07/2018:Stable  HOH: L deaf L, R  hearing aid H/o Mnire's disease  Iron deficiency anemia: Noted 01/2018, + Hemoccult, 09-2018: Colonoscopy and EGD were completely normal  COVID-19 infection 01-2021  PLAN: COVID-19: Patient is 75years old, multiple medical problems, triple vaccinated against COVID, had a flu shot, presents with a URI, symptoms onset 6 days. Tested positive for Covid today. His only symptom is cough. He is outside of the window for treatment. Anticipate he will do well however I asked him to be very cautious, if he develops fever chills, chest pain or difficulty breathing or O2 sat drops below 94% he must let me know. Also recommend rest, fluids, Tylenol and Tessalon Perles which worked really well for cough control. We will call and reschedule his CPX, to be seen in about 10 to 14 days from today. Bipolar, depression: Recent changes in medications including the addition of Wellbutrin helped significantly.      I discussed the assessment and treatment plan with the patient. The patient was provided an opportunity to ask questions and all were answered. The patient agreed with the plan and demonstrated an understanding of the instructions.   The patient was advised to call back or seek an in-person evaluation if the symptoms worsen or if the condition fails to improve as anticipated.

## 2021-02-10 NOTE — Assessment & Plan Note (Signed)
COVID-19: Patient is 75 years old, multiple medical problems, triple vaccinated against COVID, had a flu shot, presents with a URI, symptoms onset 6 days. Tested positive for Covid today. His only symptom is cough. He is outside of the window for treatment. Anticipate he will do well however I asked him to be very cautious, if he develops fever chills, chest pain or difficulty breathing or O2 sat drops below 94% he must let me know. Also recommend rest, fluids, Tylenol and Tessalon Perles which worked really well for cough control. We will call and reschedule his CPX, to be seen in about 10 to 14 days from today. Bipolar, depression: Recent changes in medications including the addition of Wellbutrin helped significantly.

## 2021-02-13 ENCOUNTER — Encounter: Payer: Medicare Other | Admitting: Internal Medicine

## 2021-02-14 ENCOUNTER — Other Ambulatory Visit: Payer: Self-pay | Admitting: Internal Medicine

## 2021-02-19 DIAGNOSIS — J301 Allergic rhinitis due to pollen: Secondary | ICD-10-CM | POA: Diagnosis not present

## 2021-02-19 DIAGNOSIS — J3089 Other allergic rhinitis: Secondary | ICD-10-CM | POA: Diagnosis not present

## 2021-02-19 DIAGNOSIS — J3081 Allergic rhinitis due to animal (cat) (dog) hair and dander: Secondary | ICD-10-CM | POA: Diagnosis not present

## 2021-02-23 ENCOUNTER — Other Ambulatory Visit: Payer: Self-pay

## 2021-02-23 ENCOUNTER — Emergency Department (HOSPITAL_BASED_OUTPATIENT_CLINIC_OR_DEPARTMENT_OTHER): Payer: Medicare Other

## 2021-02-23 ENCOUNTER — Emergency Department (HOSPITAL_BASED_OUTPATIENT_CLINIC_OR_DEPARTMENT_OTHER)
Admission: EM | Admit: 2021-02-23 | Discharge: 2021-02-23 | Disposition: A | Discharge status: Home / Self Care | Payer: Medicare Other | Attending: Emergency Medicine | Admitting: Emergency Medicine

## 2021-02-23 ENCOUNTER — Encounter (HOSPITAL_BASED_OUTPATIENT_CLINIC_OR_DEPARTMENT_OTHER): Payer: Self-pay | Admitting: *Deleted

## 2021-02-23 DIAGNOSIS — W19XXXA Unspecified fall, initial encounter: Secondary | ICD-10-CM | POA: Insufficient documentation

## 2021-02-23 DIAGNOSIS — E1140 Type 2 diabetes mellitus with diabetic neuropathy, unspecified: Secondary | ICD-10-CM | POA: Insufficient documentation

## 2021-02-23 DIAGNOSIS — Z7951 Long term (current) use of inhaled steroids: Secondary | ICD-10-CM | POA: Diagnosis not present

## 2021-02-23 DIAGNOSIS — R0781 Pleurodynia: Secondary | ICD-10-CM | POA: Diagnosis not present

## 2021-02-23 DIAGNOSIS — J449 Chronic obstructive pulmonary disease, unspecified: Secondary | ICD-10-CM | POA: Insufficient documentation

## 2021-02-23 DIAGNOSIS — R059 Cough, unspecified: Secondary | ICD-10-CM | POA: Diagnosis not present

## 2021-02-23 DIAGNOSIS — Z7984 Long term (current) use of oral hypoglycemic drugs: Secondary | ICD-10-CM | POA: Insufficient documentation

## 2021-02-23 DIAGNOSIS — I517 Cardiomegaly: Secondary | ICD-10-CM | POA: Diagnosis not present

## 2021-02-23 DIAGNOSIS — Z96653 Presence of artificial knee joint, bilateral: Secondary | ICD-10-CM | POA: Insufficient documentation

## 2021-02-23 DIAGNOSIS — I10 Essential (primary) hypertension: Secondary | ICD-10-CM | POA: Diagnosis not present

## 2021-02-23 DIAGNOSIS — Z79899 Other long term (current) drug therapy: Secondary | ICD-10-CM | POA: Diagnosis not present

## 2021-02-23 DIAGNOSIS — J45909 Unspecified asthma, uncomplicated: Secondary | ICD-10-CM | POA: Diagnosis not present

## 2021-02-23 MED ORDER — HYDROCODONE-ACETAMINOPHEN 5-325 MG PO TABS: 1.0000 | ORAL_TABLET | Freq: Four times a day (QID) | ORAL | 0 refills | Status: DC | PRN

## 2021-02-23 MED ORDER — HYDROCODONE-ACETAMINOPHEN 5-325 MG PO TABS
1.0000 | ORAL_TABLET | Freq: Once | ORAL | Status: AC
  Administered 2021-02-23: 1 via ORAL
  Filled 2021-02-23: qty 1

## 2021-02-23 NOTE — ED Notes (Signed)
Pt states started having right flank pain after a coughing fit today when he "swallowed food wrong" during lunch. Painful to palpation.

## 2021-02-23 NOTE — Discharge Instructions (Addendum)
I have prescribed you a strong narcotic called Vicodin. Please only take this as prescribed. I would recommend taking ibuprofen or tylenol for your pain throughout the day and only take this medication for breakthrough pain that you cannot control. This medication also has tylenol in it, so please be sure you are not taking more than 3000 mg of tylenol per day. Do not drive or operate heavy machinery after taking this medication. Do not mix it with alcohol.   If you develop any new or worsening symptoms, please return the emergency department.  Otherwise please follow-up with your regular doctor.  It was a pleasure to meet you.

## 2021-02-23 NOTE — ED Provider Notes (Signed)
[facility] EMERGENCY DEPARTMENT Provider Note   CSN: [id number] Arrival date & time: 02/23/21  1625     History Chief Complaint  Patient presents with  . Back Pain    Ryan Zhang is a 75 y.o. male.  HPI Patient is a 75 year old male with a complicated medical history noted below.  He presents to the emergency department due to right rib pain.  Patient states that he has a history of chronic cough.  About 4 hours ago he had a coughing bout and began experiencing exquisite pain in the right ribs.  Pain worsens with any movement.  States his current pain is 10/10.  He has been able to continue to ambulate with a cane but states his pain significantly worsens.  He took Tylenol with minimal relief.  Also reports a mechanical fall about 3 days ago resulting in right rib pain more superior to his current pain.  He states that pain resolved this morning just prior to his coughing bout.  No numbness, tingling, weakness, chest pain, shortness of breath.    Past Medical History:  Diagnosis Date  . Allergy    allergy shots Dr. Velora Heckler  . Anemia   . Anxiety   . Asthma    moderate persistant  . Bipolar affective ([place])   . Cataract    both eyes  . COPD (chronic obstructive pulmonary disease) ([place])   . Depression   . Diabetes mellitus   . DJD (degenerative joint disease)   . Dysphagia   . Gallstones    hx of, s/p cholecystectomy  . GERD (gastroesophageal reflux disease)   . Hepatitis A    as teenager 58's  . Hollenhorst plaque    right eye  . Hyperlipidemia   . Hypertension   . Kidney stones    hx of  . Meniere's disease   . Motility disorder, esophageal   . Obesity   . Pancreatitis   . Personal history of colonic polyps 11/2004   hyperplastic.  . Stroke [facility])    per MRI    Patient Active Problem List   Diagnosis Date Noted  . Tinea cruris 07/24/2020  . PCP NOTES >>>>>>>>>>>>>>>>>>>>>>>>>>>>>>> 01/12/2016  . Dry skin 05/09/2015  . Folliculitis 67/34/1935  .  Hearing loss in right ear 08/05/2014  . Pre-ulcerative corn or callous 12/27/2013  . Panic attack as reaction to stress 04/20/2012  . Dysphagia, neurologic 01/07/2012  . Morbid obesity ([place]) 01/07/2012  . Chest pain, musculoskeletal 12/07/2011  . Black-out (not amnesia) 12/07/2011  . Candidiasis of skin 08/12/2011  . Annual physical exam 06/07/2011  . OLECRANON BURSITIS 01/29/2011  . PARTIAL ARTERIAL OCCLUSION OF RETINA 05/27/2010  . Essential hypertension, benign 01/16/2010  . ABNORMAL ELECTROCARDIOGRAM 01/14/2010  . Asthma 10/14/2009  . GERD 10/14/2009  . Bipolar disorder ([place]) 02/20/2009  . PHIMOSIS 02/20/2009  . KNEE PAIN, CHRONIC 11/20/2008  . HIP PAIN, RIGHT 08/19/2008  . DM type 2 with diabetic peripheral neuropathy ([place]) 02/22/2007  . Hyperlipidemia associated with type 2 diabetes mellitus ([place]) 02/22/2007  . ALLERGIC RHINITIS, SEASONAL 02/22/2007  . LOW BACK PAIN, CHRONIC 02/22/2007  . MENIERE'S DISEASE, HX OF 02/22/2007    Past Surgical History:  Procedure Laterality Date  . CATARACT EXTRACTION Bilateral 09/2015 and 10/2015  . CHOLECYSTECTOMY    . COLONOSCOPY    . TOTAL KNEE ARTHROPLASTY Bilateral    both knees       Family History  Problem Relation Age of Onset  . Diabetes Father   .  Heart disease Brother   . Heart disease Maternal Grandmother   . Cancer Sister        unknown type  . Colon cancer Neg Hx   . Prostate cancer Neg Hx   . Colon polyps Neg Hx   . Esophageal cancer Neg Hx   . Rectal cancer Neg Hx   . Stomach cancer Neg Hx     Social History   Tobacco Use  . Smoking status: Never Smoker  . Smokeless tobacco: Never Used  Substance Use Topics  . Alcohol use: Not Currently  . Drug use: No    Home Medications Prior to Admission medications   Medication Sig Start Date End Date Taking? Authorizing Provider  HYDROcodone-acetaminophen (NORCO/VICODIN) 5-325 MG tablet Take 1 tablet by mouth every 6 (six) hours as needed. 02/23/21  Yes  Rayna Sexton, PA-C  acetaminophen (TYLENOL) 325 MG tablet Take 2 tablets (650 mg total) by mouth every 6 (six) hours as needed (mild pain; headache). 02/06/20   Georgette Shell, MD  albuterol (PROVENTIL) (2.5 MG/3ML) 0.083% nebulizer solution Take 3 mLs (2.5 mg total) by nebulization every 6 (six) hours as needed for wheezing or shortness of breath. 07/24/20   Nche, Charlene Brooke, NP  albuterol (VENTOLIN HFA) 108 (90 Base) MCG/ACT inhaler Inhale 2 puffs into the lungs every 6 (six) hours as needed for wheezing or shortness of breath. 02/11/20   Colon Branch, MD  asenapine (SAPHRIS) 5 MG SUBL 24 hr tablet Place 5 mg under the tongue at bedtime.    [provider]  augmented betamethasone dipropionate (DIPROLENE-AF) 0.05 % ointment Apply topically 2 (two) times daily as needed. 03/13/18   Colon Branch, MD  azelastine (ASTELIN) 0.1 % nasal spray Place 1 spray into both nostrils as needed for rhinitis. To replace Flonase    [provider]  benzonatate (TESSALON) 200 MG capsule Take 1 capsule (200 mg total) by mouth 3 (three) times daily as needed for cough. 02/09/21   Colon Branch, MD  budesonide-formoterol [facility]) 160-4.5 MCG/ACT inhaler Inhale 2 puffs into the lungs 2 (two) times daily.     [provider]  buPROPion (WELLBUTRIN XL) 150 MG 24 hr tablet Take 150 mg by mouth every morning. 02/02/21   [provider]  clonazePAM (KLONOPIN) 0.5 MG tablet Take 0.5 mg by mouth See admin instructions. #2 at 8am, #1 at 2pm [facility])    [provider]  clopidogrel (PLAVIX) 75 MG tablet Take 1 tablet (75 mg total) by mouth daily. 11/17/20   Colon Branch, MD  collagenase (SANTYL) ointment Apply 1 application topically daily. 06/04/20   Colon Branch, MD  DULoxetine (CYMBALTA) 60 MG capsule Take 60 mg by mouth every morning.    [provider]  EPIPEN 2-PAK 0.3 MG/0.3ML SOAJ injection Reported on 04/29/2016 07/04/14   [provider]  esomeprazole (NEXIUM)  40 MG capsule Take 40 mg by mouth every morning.     [provider]  fexofenadine (ALLEGRA) 180 MG tablet Take 180 mg by mouth every morning. 8am [name])    [provider]  fluticasone (VERAMYST) 27.5 MCG/SPRAY nasal spray Place 2 sprays into the nose daily as needed for rhinitis or allergies.     [provider]  hydrocortisone 2.5 % cream Apply topically 2 (two) times daily. 08/05/20 08/05/21  Colon Branch, MD  lamoTRIgine (LAMICTAL) 25 MG tablet Take 50 mg by mouth at bedtime.    [provider]  Lancets (  ONETOUCH ULTRASOFT) lancets Check blood sugars no more than twice daily 12/09/16   Colon Branch, MD  losartan (COZAAR) 25 MG tablet Take 1 tablet (25 mg total) by mouth every evening. Patient taking differently: Take 25 mg by mouth every evening. Takes 181m 10/23/20   Paz, JAlda Berthold MD  montelukast (SINGULAIR) 10 MG tablet Take 10 mg by mouth at bedtime. VHarold Hedge   [provider]  Multiple Vitamin (MULTIVITAMIN) tablet Take 1 tablet by mouth daily.     [provider]  NEOMYCIN-POLYMYXIN-HYDROCORTISONE (CORTISPORIN) 1 % SOLN OTIC solution Place 3 drops into the right ear 4 (four) times daily. 12/23/20   PColon Branch MD  nystatin (MYCOSTATIN/NYSTOP) powder Apply 1 application topically 3 (three) times daily. 07/24/20   Nche, CCharlene Brooke NP  OM[facility] LLCVERIO test strip use to check blood sugar no more than twice daily 12/03/19   PColon Branch MD  pioglitazone-metformin (ACTOPLUS MET) 1802-654-8841MG tablet Take 1 tablet by mouth 2 (two) times daily with a meal. 02/16/21   PColon Branch MD  simvastatin (ZOCOR) 40 MG tablet TAKE ONE TABLET BY MOUTH DAILY AT BEDTIME 02/09/21   PColon Branch MD  solifenacin (VESICARE) 10 MG tablet Take 1 tablet (10 mg total) by mouth daily. 11/26/20   PColon Branch MD  tamsulosin (FLOMAX) 0.4 MG CAPS capsule Take 1 capsule (0.4 mg total) by mouth daily after supper. 02/13/20   PColon Branch MD  traZODone (DESYREL) 50 MG tablet  Take 50 mg by mouth at bedtime as needed for sleep. [name] 07/09/14   TMidge Minium MD    Allergies    Celexa  [citalopram hydrobromide], Depakote  [divalproex sodium], Methocarbamol, Paroxetine hcl, Smz-tmp ds [sulfamethoxazole-trimethoprim], Other, Percocet [oxycodone-acetaminophen], Risperidone, and Wellbutrin [bupropion]  Review of Systems   Review of Systems  Respiratory: Negative for shortness of breath.   Cardiovascular: Negative for chest pain.  Musculoskeletal: Positive for myalgias. Negative for back pain.  Skin: Negative for color change and wound.  Neurological: Negative for weakness and numbness.    Physical Exam Updated Vital Signs BP (!) 137/92   Pulse 81   Temp 98.5 F (36.9 C) (Oral)   Ht 5' 10"  (1.778 m)   Wt 117.9 kg   SpO2 98%   BMI 37.31 kg/m   Physical Exam Vitals and nursing note reviewed.  Constitutional:      General: He is not in acute distress.    Appearance: He is well-developed.  HENT:     Head: Normocephalic and atraumatic.     Right Ear: External ear normal.     Left Ear: External ear normal.  Eyes:     General: No scleral icterus.       Right eye: No discharge.        Left eye: No discharge.     Conjunctiva/sclera: Conjunctivae normal.  Neck:     Trachea: No tracheal deviation.  Cardiovascular:     Rate and Rhythm: Normal rate.  Pulmonary:     Effort: Pulmonary effort is normal. No respiratory distress.     Breath sounds: No stridor.  Abdominal:     General: There is no distension.  Musculoskeletal:        General: Tenderness present. No swelling or deformity.     Cervical back: Neck supple.     Comments: Moderate tenderness noted along the right inferior lateral ribs.  No crepitus.  No overlying skin changes.  No bruising or swelling.  No midline spine pain.  No hip pain.  No pain in the back or flanks.   Skin:    General: Skin is warm and dry.     Findings: No rash.  Neurological:     General: No focal deficit  present.     Mental Status: He is alert and oriented to person, place, and time.     Cranial Nerves: Cranial nerve deficit: no gross deficits.     Comments: Strength is 5 out of 5 in the bilateral lower extremities.  Distal sensation is intact.  2+ DP pulses.  Patient is able to ambulate with a cane.    ED Results / Procedures / Treatments   Labs (all labs ordered are listed, but only abnormal results are displayed) Labs Reviewed - No data to display  EKG None  Radiology DG Ribs Unilateral W/Chest Right  Result Date: 02/23/2021 CLINICAL DATA:  Right rib pain after coughing, recent fall 3 days ago EXAM: RIGHT RIBS AND CHEST - 3+ VIEW COMPARISON:  None. FINDINGS: No fracture or other bone lesions are seen involving the ribs. There is no evidence of pneumothorax or pleural effusion. Both lungs are clear. Cardiomegaly. IMPRESSION: 1.  No displaced fracture or dislocation of the ribs. 2.  Cardiomegaly without acute abnormality of the lungs. Electronically Signed   By: Eddie Candle M.D.   On: 02/23/2021 18:04   Procedures Procedures   Medications Ordered in ED Medications  HYDROcodone-acetaminophen (NORCO/VICODIN) 5-325 MG per tablet 1 tablet (1 tablet Oral Given 02/23/21 1702)   ED Course  I have reviewed the triage vital signs and the nursing notes.  Pertinent labs & imaging results that were available during my care of the patient were reviewed by me and considered in my medical decision making (see chart for details).    MDM Rules/Calculators/A&P                          Patient is a 75 year old male who presents the emergency department due to right rib pain that began after coughing this afternoon.  Patient had some palpable pain to his right inferior ribs.  No abdominal pain, anterior chest wall pain, or back pain.  Strength is 5 out of 5 in the lower extremities.  I obtained a chest x-ray as well as an x-ray of the right ribs which were negative.  Patient given 1 Vicodin for  pain.  He states his pain is moderately improved.  Discussed his reassuring imaging.  Feel he is stable for discharge at this time and he is agreeable.  Will prescribe patient a short course of Vicodin for breakthrough pain for the next few days.  Given his age, we discussed fall risks in length.  Discussed safety regarding this medication.  He knows to return to the emergency department if his symptoms worsen.  He is planning on following up with his PCP if his symptoms persist.  His questions were answered and he was amicable at the time of discharge.  Final Clinical Impression(s) / ED Diagnoses Final diagnoses:  Rib pain on right side   Rx / DC Orders ED Discharge Orders         Ordered    HYDROcodone-acetaminophen (NORCO/VICODIN) 5-325 MG tablet  Every 6 hours PRN        02/23/21 1832           Rayna Sexton, PA-C 02/23/21 1840    Wyvonnia Dusky, MD 02/24/21 [phone]

## 2021-02-23 NOTE — ED Triage Notes (Signed)
C/o lower right back pain  X 1 day , increased pain with movt , recent fall x 3 days ago

## 2021-02-25 DIAGNOSIS — J301 Allergic rhinitis due to pollen: Secondary | ICD-10-CM | POA: Diagnosis not present

## 2021-02-25 DIAGNOSIS — J3089 Other allergic rhinitis: Secondary | ICD-10-CM | POA: Diagnosis not present

## 2021-03-04 DIAGNOSIS — J301 Allergic rhinitis due to pollen: Secondary | ICD-10-CM | POA: Diagnosis not present

## 2021-03-04 DIAGNOSIS — J3089 Other allergic rhinitis: Secondary | ICD-10-CM | POA: Diagnosis not present

## 2021-03-11 DIAGNOSIS — J3089 Other allergic rhinitis: Secondary | ICD-10-CM | POA: Diagnosis not present

## 2021-03-11 DIAGNOSIS — J301 Allergic rhinitis due to pollen: Secondary | ICD-10-CM | POA: Diagnosis not present

## 2021-03-12 DIAGNOSIS — E119 Type 2 diabetes mellitus without complications: Secondary | ICD-10-CM | POA: Diagnosis not present

## 2021-03-13 ENCOUNTER — Ambulatory Visit (INDEPENDENT_AMBULATORY_CARE_PROVIDER_SITE_OTHER): Payer: Medicare Other | Admitting: Internal Medicine

## 2021-03-13 ENCOUNTER — Other Ambulatory Visit: Payer: Self-pay

## 2021-03-13 ENCOUNTER — Encounter: Payer: Self-pay | Admitting: Internal Medicine

## 2021-03-13 VITALS — BP 136/88 | HR 84 | Temp 97.8°F | Resp 18 | Ht 70.0 in | Wt 262.4 lb

## 2021-03-13 DIAGNOSIS — Z0000 Encounter for general adult medical examination without abnormal findings: Secondary | ICD-10-CM

## 2021-03-13 DIAGNOSIS — E1169 Type 2 diabetes mellitus with other specified complication: Secondary | ICD-10-CM

## 2021-03-13 DIAGNOSIS — I10 Essential (primary) hypertension: Secondary | ICD-10-CM

## 2021-03-13 DIAGNOSIS — E1142 Type 2 diabetes mellitus with diabetic polyneuropathy: Secondary | ICD-10-CM | POA: Diagnosis not present

## 2021-03-13 DIAGNOSIS — E785 Hyperlipidemia, unspecified: Secondary | ICD-10-CM | POA: Diagnosis not present

## 2021-03-13 LAB — LIPID PANEL
Cholesterol: 120 mg/dL (ref 0–200)
HDL: 46 mg/dL (ref >39.00)
LDL Cholesterol: 57 mg/dL (ref 0–99)
NonHDL: 73.79
Total CHOL/HDL Ratio: 3
Triglycerides: 85 mg/dL (ref 0.0–149.0)
VLDL: 17 mg/dL (ref 0.0–40.0)

## 2021-03-13 LAB — CBC WITH DIFFERENTIAL/PLATELET
Basophils Absolute: 0.1 10*3/uL (ref 0.0–0.1)
Basophils Relative: 1 % (ref 0.0–3.0)
Eosinophils Absolute: 0.3 10*3/uL (ref 0.0–0.7)
Eosinophils Relative: 5 % (ref 0.0–5.0)
HCT: 35.7 % — ABNORMAL LOW (ref 39.0–52.0)
Hemoglobin: 11.6 g/dL — ABNORMAL LOW (ref 13.0–17.0)
Lymphocytes Relative: 24.6 % (ref 12.0–46.0)
Lymphs Abs: 1.6 10*3/uL (ref 0.7–4.0)
MCHC: 32.4 g/dL (ref 30.0–36.0)
MCV: 81.3 fl (ref 78.0–100.0)
Monocytes Absolute: 0.7 10*3/uL (ref 0.1–1.0)
Monocytes Relative: 10.1 % (ref 3.0–12.0)
Neutro Abs: 3.8 10*3/uL (ref 1.4–7.7)
Neutrophils Relative %: 59.3 % (ref 43.0–77.0)
Platelets: 288 10*3/uL (ref 150.0–400.0)
RBC: 4.39 Mil/uL (ref 4.22–5.81)
RDW: 17 % — ABNORMAL HIGH (ref 11.5–15.5)
WBC: 6.5 10*3/uL (ref 4.0–10.5)

## 2021-03-13 LAB — COMPREHENSIVE METABOLIC PANEL
ALT: 10 U/L (ref 0–53)
AST: 16 U/L (ref 0–37)
Albumin: 3.9 g/dL (ref 3.5–5.2)
Alkaline Phosphatase: 81 U/L (ref 39–117)
BUN: 21 mg/dL (ref 6–23)
CO2: 28 mEq/L (ref 19–32)
Calcium: 9 mg/dL (ref 8.4–10.5)
Chloride: 104 mEq/L (ref 96–112)
Creatinine, Ser: 0.95 mg/dL (ref 0.40–1.50)
GFR: 79.81 mL/min (ref >60.00)
Glucose, Bld: 142 mg/dL — ABNORMAL HIGH (ref 70–99)
Potassium: 5.1 mEq/L (ref 3.5–5.1)
Sodium: 140 mEq/L (ref 135–145)
Total Bilirubin: 0.7 mg/dL (ref 0.2–1.2)
Total Protein: 6.6 g/dL (ref 6.0–8.3)

## 2021-03-13 LAB — HEMOGLOBIN A1C: Hgb A1c MFr Bld: 6.5 % (ref 4.6–6.5)

## 2021-03-13 MED ORDER — NYSTATIN 100000 UNIT/GM EX CREA: 1.0000 "application " | TOPICAL_CREAM | Freq: Two times a day (BID) | CUTANEOUS | 0 refills | Status: DC | PRN

## 2021-03-13 MED ORDER — NYSTATIN 100000 UNIT/GM EX OINT: 1.0000 "application " | TOPICAL_OINTMENT | Freq: Two times a day (BID) | CUTANEOUS | 0 refills | Status: DC | PRN

## 2021-03-13 NOTE — Progress Notes (Addendum)
Subjective:    Patient ID: Ryan Zhang, male    DOB: 1946/09/18, 75 y.o.   MRN: [id number]  DOS:  03/13/2021 Type of visit - description: CPX  Since the last office visit he is doing okay. He had COVID, he feels completely recuperated. He continued to have a rash on and off the groin foreskin, request prescription for nystatin cream. Still have some cough but he thinks is related to allergies. Emotionally doing well   Review of Systems  Other than above, a 14 point review of systems is negative     Past Medical History:  Diagnosis Date   Allergy    allergy shots Dr. Velora Heckler   Anemia    Anxiety    Asthma    moderate persistant   Bipolar affective (HCC)    Cataract    both eyes   COPD (chronic obstructive pulmonary disease) (HCC)    Depression    Diabetes mellitus    DJD (degenerative joint disease)    Dysphagia    Gallstones    hx of, s/p cholecystectomy   GERD (gastroesophageal reflux disease)    Hepatitis A    as teenager 60's   Hollenhorst plaque    right eye   Hyperlipidemia    Hypertension    Kidney stones    hx of   Meniere's disease    Motility disorder, esophageal    Obesity    Pancreatitis    Personal history of colonic polyps 11/2004   hyperplastic.   Stroke [facility])    per MRI    Past Surgical History:  Procedure Laterality Date   CATARACT EXTRACTION Bilateral 09/2015 and 10/2015   CHOLECYSTECTOMY     COLONOSCOPY     TOTAL KNEE ARTHROPLASTY Bilateral    both knees   Social History   Socioeconomic History   Marital status: Married    Spouse name: Not on file   Number of children: 1   Years of education: 17   Highest education level: Bachelor's degree (e.g., BA, AB, BS)  Occupational History   Occupation: disabled    Employer: DISABLED    Comment: laboratory computing/programming  Tobacco Use   Smoking status: Never Smoker   Smokeless tobacco: Never Used  Substance and Sexual Activity   Alcohol use: Not Currently   Drug use: No    Sexual activity: Not Currently  Other Topics Concern   Not on file  Social History Narrative   Household-- pt , wife, adopted  daughter (h/o substance abuse,on a methadone program, doing better )   Fully independent life style   Social Determinants of Health   Financial Resource Strain: Low Risk    Difficulty of Paying Living Expenses: Not hard at all  Food Insecurity: No Food Insecurity   Worried About Charity fundraiser in the Last Year: Never true   [place] in the Last Year: Never true  Transportation Needs: No Transportation Needs   Lack of Transportation (Medical): No   Lack of Transportation (Non-Medical): No  Physical Activity: Not on file  Stress: Not on file  Social Connections: Not on file  Intimate Partner Violence: Not on file     Allergies as of 03/13/2021       Reactions   Celexa  [citalopram Hydrobromide] Other (See Comments)   Depakote  [divalproex Sodium] Other (See Comments)   Methocarbamol    Other reaction(s): Hallucinations   Paroxetine Hcl    Other reaction(s): Insomnia   Smz-tmp  Ds [sulfamethoxazole-trimethoprim] Nausea And Vomiting   Other    "Symbrax" alteration in blood sugar, pt unsure if low or high blood sugar   Percocet [oxycodone-acetaminophen] Other (See Comments)   Hyperactivity   Risperidone Other (See Comments)   REACTION: hyper   Wellbutrin [bupropion] Other (See Comments)   Hot flashes        Medication List        Accurate as of March 13, 2021 11:59 PM. If you have any questions, ask your nurse or doctor.          STOP taking these medications    benzonatate 200 MG capsule Commonly known as: TESSALON Stopped by: Kathlene November, MD   HYDROcodone-acetaminophen 5-325 MG tablet Commonly known as: NORCO/VICODIN Stopped by: Kathlene November, MD   hydrocortisone 2.5 % cream Stopped by: Kathlene November, MD   NEOMYCIN-POLYMYXIN-HYDROCORTISONE 1 % Soln OTIC solution Commonly known as: CORTISPORIN Stopped by: Kathlene November, MD    nystatin powder Commonly known as: MYCOSTATIN/NYSTOP Replaced by: nystatin ointment Stopped by: Kathlene November, MD   Santyl ointment Generic drug: collagenase Stopped by: Kathlene November, MD       TAKE these medications    acetaminophen 325 MG tablet Commonly known as: TYLENOL Take 2 tablets (650 mg total) by mouth every 6 (six) hours as needed (mild pain; headache).   albuterol 108 (90 Base) MCG/ACT inhaler Commonly known as: VENTOLIN HFA Inhale 2 puffs into the lungs every 6 (six) hours as needed for wheezing or shortness of breath.   albuterol (2.5 MG/3ML) 0.083% nebulizer solution Commonly known as: PROVENTIL Take 3 mLs (2.5 mg total) by nebulization every 6 (six) hours as needed for wheezing or shortness of breath.   asenapine 5 MG Subl 24 hr tablet Commonly known as: SAPHRIS Place 5 mg under the tongue at bedtime.   augmented betamethasone dipropionate 0.05 % ointment Commonly known as: DIPROLENE-AF Apply topically 2 (two) times daily as needed.   azelastine 0.1 % nasal spray Commonly known as: ASTELIN Place 1 spray into both nostrils as needed for rhinitis. To replace Flonase   budesonide-formoterol 160-4.5 MCG/ACT inhaler Commonly known as: SYMBICORT Inhale 2 puffs into the lungs 2 (two) times daily.   buPROPion 150 MG 24 hr tablet Commonly known as: WELLBUTRIN XL Take 150 mg by mouth every morning.   buPROPion 150 MG 24 hr tablet Commonly known as: WELLBUTRIN XL Take 150 mg by mouth daily.   clonazePAM 0.5 MG tablet Commonly known as: KLONOPIN Take 0.5 mg by mouth See admin instructions. #2 at 8am, #1 at 2pm [facility])   clopidogrel 75 MG tablet Commonly known as: PLAVIX Take 1 tablet (75 mg total) by mouth daily.   DULoxetine 60 MG capsule Commonly known as: CYMBALTA Take 60 mg by mouth every morning.   EpiPen 2-Pak 0.3 mg/0.3 mL Soaj injection Generic drug: EPINEPHrine Reported on 04/29/2016   esomeprazole 40 MG capsule Commonly known as: NEXIUM Take  40 mg by mouth every morning.   fexofenadine 180 MG tablet Commonly known as: ALLEGRA Take 180 mg by mouth every morning. 8am [name])   fluticasone 27.5 MCG/SPRAY nasal spray Commonly known as: VERAMYST Place 2 sprays into the nose daily as needed for rhinitis or allergies.   lamoTRIgine 25 MG tablet Commonly known as: LAMICTAL Take 50 mg by mouth at bedtime.   losartan 25 MG tablet Commonly known as: COZAAR Take 1 tablet (25 mg total) by mouth every evening. What changed: additional instructions   montelukast 10 MG tablet  Commonly known as: SINGULAIR Take 10 mg by mouth at bedtime. [name]   multivitamin tablet Take 1 tablet by mouth daily.   nystatin ointment Commonly known as: MYCOSTATIN Apply 1 application topically 2 (two) times daily as needed. Replaces: nystatin powder Started by: Kathlene November, MD   onetouch ultrasoft lancets Check blood sugars no more than twice daily   OneTouch Verio test strip Generic drug: glucose blood use to check blood sugar no more than twice daily   pioglitazone-metformin 15-850 MG tablet Commonly known as: ACTOPLUS MET Take 1 tablet by mouth 2 (two) times daily with a meal.   simvastatin 40 MG tablet Commonly known as: ZOCOR TAKE ONE TABLET BY MOUTH DAILY AT BEDTIME   solifenacin 10 MG tablet Commonly known as: VESICARE Take 1 tablet (10 mg total) by mouth daily.   tamsulosin 0.4 MG Caps capsule Commonly known as: FLOMAX Take 1 capsule (0.4 mg total) by mouth daily after supper.   traZODone 50 MG tablet Commonly known as: DESYREL Take 50 mg by mouth at bedtime as needed for sleep. [name]           Objective:   Physical Exam BP 136/88 (BP Location: Left Arm, Patient Position: Sitting, Cuff Size: Normal)   Pulse 84   Temp 97.8 F (36.6 C) (Oral)   Resp 18   Ht 5' 10"  (1.778 m)   Wt 262 lb 6 oz (119 kg)   SpO2 96%   BMI 37.65 kg/m  General: Well developed, NAD, BMI noted Neck: No  thyromegaly  HEENT:   Normocephalic . Face symmetric, atraumatic Lungs:  CTA B Normal respiratory effort, no intercostal retractions, no accessory muscle use. Heart: RRR,  no murmur.  Abdomen:  Not distended, soft, non-tender. No rebound or rigidity.   Lower extremities: no pretibial edema bilaterally  Skin: Exposed areas without rash. Not pale. Not jaundice Neurologic:  alert & oriented X3.  Speech normal, gait appropriate for age and unassisted Strength symmetric and appropriate for age.  Psych: Cognition and judgment appear intact.  Cooperative with normal attention span and concentration.  Behavior appropriate. No anxious or depressed appearing.     Assessment     Assessment DM HTN Hyperlipidemia Morbid obesity (BMI 39 plus DM) Bipolar, Depression ------ see psychiatry, Dr Casimiro Needle DJD-- hydrocodone rarely rx by [place] ortho Asthma-Allergies ------------ Dr [name]  GERD, h/o dysphagia (chronic, neurogenic-transfer dysphagia? See GI note 12-2011) Neuro: --? stroke : saw neuro 2013 d/t B transient visual loss, ASA changed to plavix.  --See CTA, MRIs of head-neck report  --Saw neuro 11-2013: fall, syncope,parkinsonism d/t sapharis? CV: Enlarged ascending Ao  --per CT 12-2015,CT chest 08-2016 stable.    --MRI chest 10/2017 stable, no further routine x-rays recommended --CT chest 07/2018: Atherosclerotic changes in the thoracic aorta.  Nl LE arterial dopplers 2015 Carotid artery disease: Per ultrasound 2011, last [place] 2018: <50% B, rx medical treatment, recheck 08/2019 < 1-39% B, stable 2 years  CT chest  ---RML  40m: 07-2014, CT 12-2015, CT 08-2016, MRI chest 12,2018, CT chest 07/2018:Stable  HOH: L deaf L, R  hearing aid H/o Mnire's disease  Iron deficiency anemia: Noted 01/2018, + Hemoccult, 09-2018: Colonoscopy and EGD were completely normal  COVID-19 infection 01-2021  PLAN: Here for CPX DM: On Actos plus met, check A1c HTN: On losartan, BP very good, checking labs High cholesterol: On  simvastatin, checking labs DJD: Currently not taking any pain medicines Carotid artery disease: Next ultrasound 08-2021 Other problems: Per psychiatry, allergist.  RTC 6 months    This visit occurred during the SARS-CoV-2 public health emergency.  Safety protocols were in place, including screening questions prior to the visit, additional usage of staff PPE, and extensive cleaning of exam room while observing appropriate contact time as indicated for disinfecting solutions.

## 2021-03-13 NOTE — Patient Instructions (Addendum)
Check the  blood pressure BP GOAL is between 110/65 and  135/85. If it is consistently higher or lower, let me know     GO TO THE LAB : Get the blood work     GO TO THE FRONT DESK, PLEASE SCHEDULE YOUR APPOINTMENTS Come back for a checkup in 6 months      "Living will", "Health Care Power of attorney": Advanced care planning  (If you already have a living will or healthcare power of attorney, please bring the copy to be scanned in your chart.)  Advance care planning is a process that supports adults in  understanding and sharing their preferences regarding future medical care.   The patient's preferences are recorded in documents called Advance Directives.    Advanced directives are completed (and can be modified at any time) while the patient is in full mental capacity.   The documentation should be available at all times to the patient, the family and the healthcare providers.  Bring in a copy to be scanned in your chart is an excellent idea and is recommended   This legal documents direct treatment decision making and/or appoint a surrogate to make the decision if the patient is not capable to do so.    Advance directives can be documented in many types of formats,  documents have names such as:  Lliving will  Durable power of attorney for healthcare (healthcare proxy or healthcare power of attorney)  Combined directives  Physician orders for life-sustaining treatment    More information at:  StageSync.si      Fall Prevention in the Home, Adult Falls can cause injuries and can affect people from all age groups. There are many simple things that you can do to make your home safe and to help prevent falls. Ask for help when making these changes, if needed. What actions can I take to prevent falls? General instructions  Use good lighting in all rooms. Replace any light bulbs that burn out.  Turn on lights if it is dark. Use night-lights.  Place  frequently used items in easy-to-reach places. Lower the shelves around your home if necessary.  Set up furniture so that there are clear paths around it. Avoid moving your furniture around.  Remove throw rugs and other tripping hazards from the floor.  Avoid walking on wet floors.  Fix any uneven floor surfaces.  Add color or contrast paint or tape to grab bars and handrails in your home. Place contrasting color strips on the first and last steps of stairways.  When you use a stepladder, make sure that it is completely opened and that the sides are firmly locked. Have someone hold the ladder while you are using it. Do not climb a closed stepladder.  Be aware of any and all pets. What can I do in the bathroom?  Keep the floor dry. Immediately clean up any water that spills onto the floor.  Remove soap buildup in the tub or shower on a regular basis.  Use non-skid mats or decals on the floor of the tub or shower.  Attach bath mats securely with double-sided, non-slip rug tape.  If you need to sit down while you are in the shower, use a plastic, non-slip stool.  Install grab bars by the toilet and in the tub and shower. Do not use towel bars as grab bars.      What can I do in the bedroom?  Make sure that a bedside light is easy to  reach.  Do not use oversized bedding that drapes onto the floor.  Have a firm chair that has side arms to use for getting dressed. What can I do in the kitchen?  Clean up any spills right away.  If you need to reach for something above you, use a sturdy step stool that has a grab bar.  Keep electrical cables out of the way.  Do not use floor polish or wax that makes floors slippery. If you must use wax, make sure that it is non-skid floor wax. What can I do in the stairways?  Do not leave any items on the stairs.  Make sure that you have a light switch at the top of the stairs and the bottom of the stairs. Have them installed if you do not  have them.  Make sure that there are handrails on both sides of the stairs. Fix handrails that are broken or loose. Make sure that handrails are as long as the stairways.  Install non-slip stair treads on all stairs in your home.  Avoid having throw rugs at the top or bottom of stairways, or secure the rugs with carpet tape to prevent them from moving.  Choose a carpet design that does not hide the edge of steps on the stairway.  Check any carpeting to make sure that it is firmly attached to the stairs. Fix any carpet that is loose or worn. What can I do on the outside of my home?  Use bright outdoor lighting.  Regularly repair the edges of walkways and driveways and fix any cracks.  Remove high doorway thresholds.  Trim any shrubbery on the main path into your home.  Regularly check that handrails are securely fastened and in good repair. Both sides of any steps should have handrails.  Install guardrails along the edges of any raised decks or porches.  Clear walkways of debris and clutter, including tools and rocks.  Have leaves, snow, and ice cleared regularly.  Use sand or salt on walkways during winter months.  In the garage, clean up any spills right away, including grease or oil spills. What other actions can I take?  Wear closed-toe shoes that fit well and support your feet. Wear shoes that have rubber soles or low heels.  Use mobility aids as needed, such as canes, walkers, scooters, and crutches.  Review your medicines with your health care provider. Some medicines can cause dizziness or changes in blood pressure, which increase your risk of falling. Talk with your health care provider about other ways that you can decrease your risk of falls. This may include working with a physical therapist or trainer to improve your strength, balance, and endurance. Where to find more information  Centers for Disease Control and Prevention, STEADI:  TVDivision.uy  [employer] on Aging: RingConnections.si Contact a health care provider if:  You are afraid of falling at home.  You feel weak, drowsy, or dizzy at home.  You fall at home. Summary  There are many simple things that you can do to make your home safe and to help prevent falls.  Ways to make your home safe include removing tripping hazards and installing grab bars in the bathroom.  Ask for help when making these changes in your home. This information is not intended to replace advice given to you by your health care provider. Make sure you discuss any questions you have with your health care provider. Document Revised: 10/14/2017 Document Reviewed: 06/16/2017 Elsevier Patient Education  [employer].

## 2021-03-14 ENCOUNTER — Encounter: Payer: Self-pay | Admitting: Internal Medicine

## 2021-03-14 NOTE — Assessment & Plan Note (Signed)
Here for CPX DM: On Actos plus met, check A1c HTN: On losartan, BP very good, checking labs High cholesterol: On simvastatin, checking labs DJD: Currently not taking any pain medicines Carotid artery disease: Next ultrasound 08-2021 Other problems: Per psychiatry, allergist. RTC 6 months

## 2021-03-14 NOTE — Assessment & Plan Note (Signed)
-  Td 2018 - pnm shot 2015; prevnar 2016 - zostavax 2013; s/p shingrex x 2 per pt -COVID VAX x3, booster discussed. -CCS: Normal Cscope 2011; cscope 09/2018, next per GI -Prostate ca screening: All previous PSAs normal, patient is now > 75 y/o. Sees urology for LUTS - Patient education: Diet-exercise- Fall prevention- POA -lab: CMP, FLP, CBC, A1c

## 2021-03-18 DIAGNOSIS — J3089 Other allergic rhinitis: Secondary | ICD-10-CM | POA: Diagnosis not present

## 2021-03-18 DIAGNOSIS — J301 Allergic rhinitis due to pollen: Secondary | ICD-10-CM | POA: Diagnosis not present

## 2021-03-25 DIAGNOSIS — J301 Allergic rhinitis due to pollen: Secondary | ICD-10-CM | POA: Diagnosis not present

## 2021-03-25 DIAGNOSIS — J3089 Other allergic rhinitis: Secondary | ICD-10-CM | POA: Diagnosis not present

## 2021-04-01 DIAGNOSIS — J301 Allergic rhinitis due to pollen: Secondary | ICD-10-CM | POA: Diagnosis not present

## 2021-04-01 DIAGNOSIS — J3089 Other allergic rhinitis: Secondary | ICD-10-CM | POA: Diagnosis not present

## 2021-04-06 ENCOUNTER — Telehealth: Payer: Self-pay | Admitting: Pharmacist

## 2021-04-06 NOTE — Chronic Care Management (AMB) (Signed)
Chronic Care Management Pharmacy Assistant   Name: Ryan Zhang  MRN: [id number] DOB: 03-Jan-1946   Reason for Encounter: Disease State General Adherence     Recent office visits:  03/13/2021 Kathlene November (PCP) Annual Physical exam visit. Labs ordered. Follow up in 6 months. 02/09/2021 Jose paz (PCP) Video visit-COVID positive. Started on Benzonatate 200 mg. Follow up in 10-14 days. 12/23/2020 Jose PAZ (pcp) Seen for Ear infection. Start Augmentin for 1 week and Cortisporin ear drops. 10/17/2020 Jose paz (PCP) General follow up.Labs ordered. Follow up in 3 months. Recent consult visits:  10/16/2020 Melony Overly, MD (Otolaryngology) Seen for eval of right ear bleeding. Discontinue antibiotic ear drops.  Hospital visits:  Medication Reconciliation was completed by comparing discharge summary, patient's EMR and Pharmacy list, and upon discussion with patient.  Admitted to the hospital on 02/23/2021 due to rib pain. Discharge date was 02/23/2021. Discharged from [place]?Medications Started at [facility] Discharge:?? -started Hydrocodone-acetaminophen 5-325 mg tab due to rib pain  Medication Changes at Hospital Discharge: No changes  Medications Discontinued at Hospital Discharge: None noted  Medications that remain the same after Hospital Discharge:??  -All other medications will remain the same.    Hospital visits:  Medication Reconciliation was completed by comparing discharge summary, patient's EMR and Pharmacy list, and upon discussion with patient.  Admitted to the hospital on 10/11/2020 due to Ear drainage. Discharge date was 10/11/2020. Discharged from [place]?Medications Started at [facility] Discharge:?? -started Cefdinir 300 mg cap  Medication Changes at Hospital Discharge: No changes  Medications Discontinued at Hospital Discharge: None noted  Medications that remain the same after Hospital  Discharge:??  -All other medications will remain the same  Medications: Outpatient Encounter Medications as of 04/06/2021  Medication Sig Note  . acetaminophen (TYLENOL) 325 MG tablet Take 2 tablets (650 mg total) by mouth every 6 (six) hours as needed (mild pain; headache).   [name] albuterol (PROVENTIL) (2.5 MG/3ML) 0.083% nebulizer solution Take 3 mLs (2.5 mg total) by nebulization every 6 (six) hours as needed for wheezing or shortness of breath.   [name] albuterol (VENTOLIN HFA) 108 (90 Base) MCG/ACT inhaler Inhale 2 puffs into the lungs every 6 (six) hours as needed for wheezing or shortness of breath.   [name] asenapine (SAPHRIS) 5 MG SUBL 24 hr tablet Place 5 mg under the tongue at bedtime.   [name] augmented betamethasone dipropionate (DIPROLENE-AF) 0.05 % ointment Apply topically 2 (two) times daily as needed.   [name] azelastine (ASTELIN) 0.1 % nasal spray Place 1 spray into both nostrils as needed for rhinitis. To replace Flonase   . budesonide-formoterol (SYMBICORT) 160-4.5 MCG/ACT inhaler Inhale 2 puffs into the lungs 2 (two) times daily.    [name] buPROPion (WELLBUTRIN XL) 150 MG 24 hr tablet Take 150 mg by mouth every morning.   [name] buPROPion (WELLBUTRIN XL) 150 MG 24 hr tablet Take 150 mg by mouth daily.   . clonazePAM (KLONOPIN) 0.5 MG tablet Take 0.5 mg by mouth See admin instructions. #2 at 8am, #1 at 2pm [facility])   . clopidogrel (PLAVIX) 75 MG tablet Take 1 tablet (75 mg total) by mouth daily.   . DULoxetine (CYMBALTA) 60 MG capsule Take 60 mg by mouth every morning.   [name] EPIPEN 2-PAK 0.3 MG/0.3ML SOAJ injection Reported on 04/29/2016 (Patient not taking: Reported on 03/13/2021) 01/01/2019: PRN  . esomeprazole (NEXIUM) 40 MG capsule Take 40 mg by mouth every  morning.    . fexofenadine (ALLEGRA) 180 MG tablet Take 180 mg by mouth every morning. 8am [name])   . fluticasone (VERAMYST) 27.5 MCG/SPRAY nasal spray Place 2 sprays into the nose daily as needed for rhinitis or allergies.  02/26/2020: PRN [name])  . lamoTRIgine (LAMICTAL) 25 MG tablet Take 50 mg by mouth at bedtime.   . Lancets (ONETOUCH ULTRASOFT) lancets Check blood sugars no more than twice daily   . losartan (COZAAR) 25 MG tablet Take 1 tablet (25 mg total) by mouth every evening. (Patient taking differently: Take 25 mg by mouth every evening. Takes 112m)   . montelukast (SINGULAIR) 10 MG tablet Take 10 mg by mouth at bedtime. VHarold Hedge  . Multiple Vitamin (MULTIVITAMIN) tablet Take 1 tablet by mouth daily.    .Marland Kitchennystatin ointment (MYCOSTATIN) Apply 1 application topically 2 (two) times daily as needed.   .Glory RosebushVERIO test strip use to check blood sugar no more than twice daily   . pioglitazone-metformin (ACTOPLUS MET) 15-850 MG tablet Take 1 tablet by mouth 2 (two) times daily with a meal.   . simvastatin (ZOCOR) 40 MG tablet TAKE ONE TABLET BY MOUTH DAILY AT BEDTIME   . solifenacin (VESICARE) 10 MG tablet Take 1 tablet (10 mg total) by mouth daily.   . tamsulosin (FLOMAX) 0.4 MG CAPS capsule Take 1 capsule (0.4 mg total) by mouth daily after supper.   . traZODone (DESYREL) 50 MG tablet Take 50 mg by mouth at bedtime as needed for sleep. [name] 02/26/2020: Takes 1516mPRN (about 3 times per year)   No facility-administered encounter medications on file as of 04/06/2021.   Have you had any problems recently with your health? Patient states he has not had any problems with his health.  Have you had any problems with your pharmacy? Patient states he has no problems with his pharmacy.  What issues or side effects are you having with your medications? Patient states he does not have any issues or side effects with his medications.  What would you like me to pass along to [employer] for them to help you with?  Patient states there is nothing at this time.   What can we do to take care of you better? Patient states there is nothing at this time.  Star Rating Drugs: Losartan 25 mg Last filled:01/24/2021 90  DS Simvastatin 40 mg Last filled:02/09/2021 90 DS Actoplus metformin 15-850 mg Last filled:02/16/2021 90 DS  [facility] Assistant [phone]

## 2021-04-08 DIAGNOSIS — J301 Allergic rhinitis due to pollen: Secondary | ICD-10-CM | POA: Diagnosis not present

## 2021-04-08 DIAGNOSIS — J3089 Other allergic rhinitis: Secondary | ICD-10-CM | POA: Diagnosis not present

## 2021-04-15 DIAGNOSIS — J301 Allergic rhinitis due to pollen: Secondary | ICD-10-CM | POA: Diagnosis not present

## 2021-04-15 DIAGNOSIS — J3089 Other allergic rhinitis: Secondary | ICD-10-CM | POA: Diagnosis not present

## 2021-04-20 DIAGNOSIS — J3089 Other allergic rhinitis: Secondary | ICD-10-CM | POA: Diagnosis not present

## 2021-04-20 DIAGNOSIS — J301 Allergic rhinitis due to pollen: Secondary | ICD-10-CM | POA: Diagnosis not present

## 2021-04-23 ENCOUNTER — Other Ambulatory Visit (HOSPITAL_BASED_OUTPATIENT_CLINIC_OR_DEPARTMENT_OTHER): Payer: Self-pay | Admitting: Physician Assistant

## 2021-04-23 DIAGNOSIS — R52 Pain, unspecified: Secondary | ICD-10-CM

## 2021-04-23 DIAGNOSIS — M25512 Pain in left shoulder: Secondary | ICD-10-CM | POA: Diagnosis not present

## 2021-04-25 ENCOUNTER — Other Ambulatory Visit: Payer: Self-pay | Admitting: Internal Medicine

## 2021-04-27 ENCOUNTER — Telehealth: Payer: Medicare Other

## 2021-04-27 NOTE — Progress Notes (Signed)
Subjective:   Ryan Zhang is a 75 y.o. male who presents for Medicare Annual/Subsequent preventive examination.  I connected with [place] today by telephone and verified that I am speaking with the correct person using two identifiers. Location patient: home Location provider: work Persons participating in the virtual visit: patient, Marine scientist.    I discussed the limitations, risks, security and privacy concerns of performing an evaluation and management service by telephone and the availability of in person appointments. I also discussed with the patient that there may be a patient responsible charge related to this service. The patient expressed understanding and verbally consented to this telephonic visit.    Interactive audio and video telecommunications were attempted between this provider and patient, however failed, due to patient having technical difficulties OR patient did not have access to video capability.  We continued and completed visit with audio only.  Some vital signs may be absent or patient reported.   Time Spent with patient on telephone encounter: 25 minutes   Review of Systems     Cardiac Risk Factors include: advanced age (>43mn, >>2women);male gender;diabetes mellitus;dyslipidemia;hypertension;obesity (BMI >30kg/m2);sedentary lifestyle     Objective:    Today's Vitals   04/28/21 1419  Weight: 262 lb (118.8 kg)  Height: 5' 10"  (1.778 m)  PainSc: 5    Body mass index is 37.59 kg/m.  Advanced Directives 04/28/2021 10/11/2020 08/08/2020 07/26/2020 04/26/2020 04/11/2020 02/06/2020  Does Patient Have a Medical Advance Directive? Yes Yes Yes Yes Yes Yes Yes  Type of AParamedicof [place] will - - [place] - [place] will [place]Living will  Does patient want to make changes to medical advance directive? - - - No - Patient declined - No - Patient declined No  - Patient declined  Copy of [place] Chart? No - copy requested - - Yes - validated most recent copy scanned in chart (See row information) - Yes - validated most recent copy scanned in chart (See row information) -  Would patient like information on creating a medical advance directive? - - - No - Patient declined - - -    Current Medications (verified) Outpatient Encounter Medications as of 04/28/2021  Medication Sig   acetaminophen (TYLENOL) 325 MG tablet Take 2 tablets (650 mg total) by mouth every 6 (six) hours as needed (mild pain; headache).   albuterol (PROVENTIL) (2.5 MG/3ML) 0.083% nebulizer solution Take 3 mLs (2.5 mg total) by nebulization every 6 (six) hours as needed for wheezing or shortness of breath.   albuterol (VENTOLIN HFA) 108 (90 Base) MCG/ACT inhaler Inhale 2 puffs into the lungs every 6 (six) hours as needed for wheezing or shortness of breath.   asenapine (SAPHRIS) 5 MG SUBL 24 hr tablet Place 5 mg under the tongue at bedtime.   augmented betamethasone dipropionate (DIPROLENE-AF) 0.05 % ointment Apply topically 2 (two) times daily as needed.   azelastine (ASTELIN) 0.1 % nasal spray Place 1 spray into both nostrils as needed for rhinitis. To replace Flonase   budesonide-formoterol (SYMBICORT) 160-4.5 MCG/ACT inhaler Inhale 2 puffs into the lungs 2 (two) times daily.    buPROPion (WELLBUTRIN XL) 150 MG 24 hr tablet Take 150 mg by mouth every morning.   buPROPion (WELLBUTRIN XL) 150 MG 24 hr tablet Take 150 mg by mouth daily.   clonazePAM (KLONOPIN) 0.5 MG tablet Take 0.5 mg by mouth See admin instructions. #2 at 8am, #1 at  2pm ([name])   clopidogrel (PLAVIX) 75 MG tablet Take 1 tablet (75 mg total) by mouth daily.   EPIPEN 2-PAK 0.3 MG/0.3ML SOAJ injection Reported on 04/29/2016   esomeprazole (NEXIUM) 40 MG capsule Take 40 mg by mouth every morning.    fexofenadine (ALLEGRA) 180 MG tablet Take 180 mg by mouth every morning. 8am [name])    fluticasone (VERAMYST) 27.5 MCG/SPRAY nasal spray Place 2 sprays into the nose daily as needed for rhinitis or allergies.    lamoTRIgine (LAMICTAL) 25 MG tablet Take 50 mg by mouth at bedtime.   Lancets (ONETOUCH ULTRASOFT) lancets Check blood sugars no more than twice daily   losartan (COZAAR) 25 MG tablet Take 1 tablet (25 mg total) by mouth every evening.   montelukast (SINGULAIR) 10 MG tablet Take 10 mg by mouth at bedtime. [name]   Multiple Vitamin (MULTIVITAMIN) tablet Take 1 tablet by mouth daily.    nystatin ointment (MYCOSTATIN) Apply 1 application topically 2 (two) times daily as needed.   ONETOUCH VERIO test strip use to check blood sugar no more than twice daily   pioglitazone-metformin (ACTOPLUS MET) 15-850 MG tablet Take 1 tablet by mouth 2 (two) times daily with a meal.   simvastatin (ZOCOR) 40 MG tablet TAKE ONE TABLET BY MOUTH DAILY AT BEDTIME   solifenacin (VESICARE) 10 MG tablet Take 1 tablet (10 mg total) by mouth daily.   tamsulosin (FLOMAX) 0.4 MG CAPS capsule Take 1 capsule (0.4 mg total) by mouth daily after supper.   traZODone (DESYREL) 50 MG tablet Take 50 mg by mouth at bedtime as needed for sleep. [name]   DULoxetine (CYMBALTA) 60 MG capsule Take 60 mg by mouth every morning.   No facility-administered encounter medications on file as of 04/28/2021.    Allergies (verified) Celexa  [citalopram hydrobromide], Depakote  [divalproex sodium], Methocarbamol, Paroxetine hcl, Smz-tmp ds [sulfamethoxazole-trimethoprim], Other, Percocet [oxycodone-acetaminophen], Risperidone, and Wellbutrin [bupropion]   History: Past Medical History:  Diagnosis Date   Allergy    allergy shots Dr. Velora Heckler   Anemia    Anxiety    Asthma    moderate persistant   Bipolar affective (HCC)    Cataract    both eyes   COPD (chronic obstructive pulmonary disease) (HCC)    Depression    Diabetes mellitus    DJD (degenerative joint disease)    Dysphagia    Gallstones    hx of, s/p  cholecystectomy   GERD (gastroesophageal reflux disease)    Hepatitis A    as teenager 60's   Hollenhorst plaque    right eye   Hyperlipidemia    Hypertension    Kidney stones    hx of   Meniere's disease    Motility disorder, esophageal    Obesity    Pancreatitis    Personal history of colonic polyps 11/2004   hyperplastic.   Stroke [facility])    per MRI   Past Surgical History:  Procedure Laterality Date   CATARACT EXTRACTION Bilateral 09/2015 and 10/2015   CHOLECYSTECTOMY     COLONOSCOPY     TOTAL KNEE ARTHROPLASTY Bilateral    both knees   Family History  Problem Relation Age of Onset   Diabetes Father    Heart disease Brother    Heart disease Maternal Grandmother    Cancer Sister        unknown type   Colon cancer Neg Hx    Prostate cancer Neg Hx    Colon polyps Neg Hx  Esophageal cancer Neg Hx    Rectal cancer Neg Hx    Stomach cancer Neg Hx    Social History   Socioeconomic History   Marital status: Married    Spouse name: Not on file   Number of children: 1   Years of education: 17   Highest education level: Bachelor's degree (e.g., BA, AB, BS)  Occupational History   Occupation: disabled    Employer: DISABLED    Comment: laboratory computing/programming  Tobacco Use   Smoking status: Never   Smokeless tobacco: Never  Substance and Sexual Activity   Alcohol use: Not Currently   Drug use: No   Sexual activity: Not Currently  Other Topics Concern   Not on file  Social History Narrative   Household-- pt , wife, adopted  daughter (h/o substance abuse,on a methadone program, doing better )   Fully independent life style   Social Determinants of Health   Financial Resource Strain: Low Risk    Difficulty of Paying Living Expenses: Not hard at all  Food Insecurity: No Food Insecurity   Worried About Charity fundraiser in the Last Year: Never true   [place] in the Last Year: Never true  Transportation Needs: No Transportation Needs   Lack  of Transportation (Medical): No   Lack of Transportation (Non-Medical): No  Physical Activity: Insufficiently Active   Days of Exercise per Week: 7 days   Minutes of Exercise per Session: 20 min  Stress: No Stress Concern Present   Feeling of Stress : Not at all  Social Connections: Moderately Integrated   Frequency of Communication with Friends and Family: More than three times a week   Frequency of Social Gatherings with Friends and Family: Once a week   Attends Religious Services: More than 4 times per year   Active Member of [employer] or Organizations: No   Attends Music therapist: Never   Marital Status: Married    Tobacco Counseling Counseling given: Not Answered   Clinical Intake:  Pre-visit preparation completed: Yes  Pain : 0-10 Pain Score: 5  Pain Type: Chronic pain Pain Location: Shoulder (seeing ortho) Pain Orientation: Left Pain Onset: More than a month ago Pain Frequency: Constant     Nutritional Status: BMI > 30  Obese Nutritional Risks: None Diabetes: Yes CBG done?: No Did pt. bring in CBG monitor from home?: No (phone visit)  How often do you need to have someone help you when you read instructions, pamphlets, or other written materials from your doctor or pharmacy?: 1 - Never  Diabetes:  Is the patient diabetic?  Yes  If diabetic, was a CBG obtained today?  No  Did the patient bring in their glucometer from home?  No phone visit How often do you monitor your CBG's? occasionally.   Financial Strains and Diabetes Management:  Are you having any financial strains with the device, your supplies or your medication? No .  Does the patient want to be seen by Chronic Care Management for management of their diabetes?  No  Would the patient like to be referred to a Nutritionist or for Diabetic Management?  No   Diabetic Exams:  Diabetic Eye Exam: Completed 06/12/2020.   Diabetic Foot Exam: . Pt has been advised about the importance in  completing this exam. To be completed by PCP Interpreter Needed?: No  Information entered by :: Caroleen Hamman LPn   Activities of Daily Living In your present state of health, do you  have any difficulty performing the following activities: 04/28/2021  Hearing? Y  Vision? N  Difficulty concentrating or making decisions? N  Walking or climbing stairs? N  Dressing or bathing? N  Doing errands, shopping? N  Preparing Food and eating ? N  Using the Toilet? N  In the past six months, have you accidently leaked urine? N  Do you have problems with loss of bowel control? N  Managing your Medications? N  Managing your Finances? N  Housekeeping or managing your Housekeeping? N  Some recent data might be hidden    Patient Care Team: Colon Branch, MD as PCP - General (Internal Medicine) [name], Darrick Grinder, MD as Consulting Physician (Allergy and Immunology) Norma Fredrickson, MD as Consulting Physician (Psychiatry) Calvert Cantor, MD as Consulting Physician (Ophthalmology) Gean Birchwood, DPM as Consulting Physician (Podiatry) Day, Melvenia Beam, [facility] (Inactive) as Pharmacist (Pharmacist)  Indicate any recent Medical Services you may have received from other than [facility] providers in the past year (date may be approximate).     Assessment:   This is a routine wellness examination for Yonis.  Hearing/Vision screen Hearing Screening - Comments:: Hearing loss in left ear Wears hearing aid  in right ear Vision Screening - Comments:: Last eye exam-06/12/2020-Dr Eulas Post  Dietary issues and exercise activities discussed: Current Exercise Habits: Home exercise routine, Type of exercise: walking, Time (Minutes): 20, Frequency (Times/Week): 7, Weekly Exercise (Minutes/Week): 140, Exercise limited by: orthopedic condition(s) (shoulder pain & impaired balance)   Goals Addressed             This Visit's Progress    Patient Stated       Increase activity        Depression Screen PHQ 2/9 Scores  04/28/2021 03/13/2021 12/23/2020 04/11/2020 01/10/2020 02/13/2019 07/31/2018  PHQ - 2 Score 1 0 4 1 2  0 0  PHQ- 9 Score - 1 18 - 9 - -  Exception Documentation - - - - - - -  Not completed - - - - - - -    Fall Risk Fall Risk  04/28/2021 12/23/2020 04/11/2020 07/12/2019 02/13/2019  Falls in the past year? 1 0 1 1 1   Number falls in past yr: 1 0 1 0 1  Injury with Fall? 0 0 0 0 0  Risk Factor Category  - - - - -  Risk for fall due to : History of fall(s);Impaired balance/gait - - - Impaired balance/gait  Risk for fall due to: Comment - - - - -  Follow up Falls prevention discussed - Education provided;Falls prevention discussed Falls evaluation completed -    FALL RISK PREVENTION PERTAINING TO THE HOME:  Any stairs in or around the home? No  Home free of loose throw rugs in walkways, pet beds, electrical cords, etc? Yes  Adequate lighting in your home to reduce risk of falls? Yes   ASSISTIVE DEVICES UTILIZED TO PREVENT FALLS:  Life alert? No  Use of a cane, walker or w/c? Yes occasionally uses a cane Grab bars in the bathroom? No  Shower chair or bench in shower? No  Elevated toilet seat or a handicapped toilet? No   TIMED UP AND GO:  Was the test performed? No .phone visit    Cognitive Function:Normal cognitive status assessed by this Nurse Health Advisor. No abnormalities found.   MMSE - Mini Mental State Exam 02/13/2019 02/09/2017  Not completed: Unable to complete -  Orientation to time - 5  Orientation to Place - 5  Registration - 3  Attention/ Calculation - 5  Recall - 3  Language- name 2 objects - 2  Language- repeat - 1  Language- follow 3 step command - 3  Language- read & follow direction - 1  Write a sentence - 1  Copy design - 1  Total score - 30        Immunizations Immunization History  Administered Date(s) Administered   Fluad Quad(high Dose 65+) 07/12/2019   H1N1 11/14/2008   Influenza Split 08/12/2011, 08/03/2012   Influenza Whole 08/22/2009, 08/17/2010    Influenza, High Dose Seasonal PF 08/28/2018   Influenza,inj,Quad PF,6+ Mos 09/25/2013, 10/14/2014, 09/25/2015   Influenza-Unspecified 08/04/2016, 07/22/2017, 09/04/2020   PFIZER(Purple Top)SARS-COV-2 Vaccination 12/23/2019, 01/16/2020, 09/04/2020   Pneumococcal Conjugate-13 02/12/2015   Pneumococcal Polysaccharide-23 11/15/2008, 12/27/2013   Td 08/22/2009, 07/20/2010, 12/15/2016   Zoster Recombinat (Shingrix) 03/16/2017, 09/15/2017   Zoster, Live 04/20/2012    TDAP status: Up to date  Flu Vaccine status: Up to date  Pneumococcal vaccine status: Up to date  Covid-19 vaccine status: Completed vaccines  Qualifies for Shingles Vaccine? No   Zostavax completed Yes   Shingrix Completed?: Yes  Screening Tests Health Maintenance  Topic Date Due   COVID-19 Vaccine (4 - Booster for Pfizer series) 01/05/2021   FOOT EXAM  01/09/2021   OPHTHALMOLOGY EXAM  06/12/2021   INFLUENZA VACCINE  06/15/2021   HEMOGLOBIN A1C  09/12/2021   TETANUS/TDAP  12/15/2026   Hepatitis C Screening  Completed   PNA vac Low Risk Adult  Completed   Zoster Vaccines- Shingrix  Completed   HPV VACCINES  Aged Out    Health Maintenance  Health Maintenance Due  Topic Date Due   COVID-19 Vaccine (4 - Booster for Pfizer series) 01/05/2021   FOOT EXAM  01/09/2021    Colorectal cancer screening: No longer required.   Lung Cancer Screening: (Low Dose CT Chest recommended if Age 21-80 years, 30 pack-year currently smoking OR have quit w/in 15years.) does not qualify.    Additional Screening:  Hepatitis C Screening: Completed 05/13/2016  Vision Screening: Recommended annual ophthalmology exams for early detection of glaucoma and other disorders of the eye. Is the patient up to date with their annual eye exam?  Yes  Who is the provider or what is the name of the office in which the patient attends annual eye exams? Dr. Eulas Post   Dental Screening: Recommended annual dental exams for proper oral  hygiene  Community Resource Referral / Chronic Care Management: CRR required this visit?  No   CCM required this visit?  No      Plan:     I have personally reviewed and noted the following in the patient's chart:   Medical and social history Use of alcohol, tobacco or illicit drugs  Current medications and supplements including opioid prescriptions. Patient is not currently taking opioid prescriptions. Functional ability and status Nutritional status Physical activity Advanced directives List of other physicians Hospitalizations, surgeries, and ER visits in previous 12 months Vitals Screenings to include cognitive, depression, and falls Referrals and appointments  In addition, I have reviewed and discussed with patient certain preventive protocols, quality metrics, and best practice recommendations. A written personalized care plan for preventive services as well as general preventive health recommendations were provided to patient.   Due to this being a telephonic visit, the after visit summary with patients personalized plan was offered to patient via mail or my-chart. Patient would like to access on my-chart.   Marta Antu,  LPN   7/99/[id number]  [facility]  Nurse Notes: None

## 2021-04-28 ENCOUNTER — Ambulatory Visit (INDEPENDENT_AMBULATORY_CARE_PROVIDER_SITE_OTHER): Payer: Medicare Other

## 2021-04-28 VITALS — Ht 70.0 in | Wt 262.0 lb

## 2021-04-28 DIAGNOSIS — Z0000 Encounter for general adult medical examination without abnormal findings: Secondary | ICD-10-CM

## 2021-04-28 NOTE — Patient Instructions (Signed)
Mr. Ryan Zhang , Thank you for taking time to come for your Medicare Wellness Visit. I appreciate your ongoing commitment to your health goals. Please review the following plan we discussed and let me know if I can assist you in the future.   Screening recommendations/referrals: Colonoscopy: No longer required Recommended yearly ophthalmology/optometry visit for glaucoma screening and checkup Recommended yearly dental visit for hygiene and checkup  Vaccinations: Influenza vaccine: Up to date Pneumococcal vaccine: Up to date Tdap vaccine: Up to date-Due-12/15/2026 Shingles vaccine: Completed vaccines   Covid-19: Up to date  Advanced directives: Copy in chart  Conditions/risks identified: See problem list  Next appointment: Follow up in one year for your annual wellness visit.   Preventive Care 10 Years and Older, Male Preventive care refers to lifestyle choices and visits with your health care provider that can promote health and wellness. What does preventive care include? A yearly physical exam. This is also called an annual well check. Dental exams once or twice a year. Routine eye exams. Ask your health care provider how often you should have your eyes checked. Personal lifestyle choices, including: Daily care of your teeth and gums. Regular physical activity. Eating a healthy diet. Avoiding tobacco and drug use. Limiting alcohol use. Practicing safe sex. Taking low doses of aspirin every day. Taking vitamin and mineral supplements as recommended by your health care provider. What happens during an annual well check? The services and screenings done by your health care provider during your annual well check will depend on your age, overall health, lifestyle risk factors, and family history of disease. Counseling  Your health care provider may ask you questions about your: Alcohol use. Tobacco use. Drug use. Emotional well-being. Home and relationship well-being. Sexual  activity. Eating habits. History of falls. Memory and ability to understand (cognition). Work and work Astronomer. Screening  You may have the following tests or measurements: Height, weight, and BMI. Blood pressure. Lipid and cholesterol levels. These may be checked every 5 years, or more frequently if you are over 101 years old. Skin check. Lung cancer screening. You may have this screening every year starting at age 40 if you have a 30-pack-year history of smoking and currently smoke or have quit within the past 15 years. Fecal occult blood test (FOBT) of the stool. You may have this test every year starting at age 60. Flexible sigmoidoscopy or colonoscopy. You may have a sigmoidoscopy every 5 years or a colonoscopy every 10 years starting at age 23. Prostate cancer screening. Recommendations will vary depending on your family history and other risks. Hepatitis C blood test. Hepatitis B blood test. Sexually transmitted disease (STD) testing. Diabetes screening. This is done by checking your blood sugar (glucose) after you have not eaten for a while (fasting). You may have this done every 1-3 years. Abdominal aortic aneurysm (AAA) screening. You may need this if you are a current or former smoker. Osteoporosis. You may be screened starting at age 70 if you are at high risk. Talk with your health care provider about your test results, treatment options, and if necessary, the need for more tests. Vaccines  Your health care provider may recommend certain vaccines, such as: Influenza vaccine. This is recommended every year. Tetanus, diphtheria, and acellular pertussis (Tdap, Td) vaccine. You may need a Td booster every 10 years. Zoster vaccine. You may need this after age 75. Pneumococcal 13-valent conjugate (PCV13) vaccine. One dose is recommended after age 27. Pneumococcal polysaccharide (PPSV23) vaccine. One dose is recommended  after age 33. Talk to your health care provider about which  screenings and vaccines you need and how often you need them. This information is not intended to replace advice given to you by your health care provider. Make sure you discuss any questions you have with your health care provider. Document Released: 11/28/2015 Document Revised: 07/21/2016 Document Reviewed: 09/02/2015 Elsevier Interactive Patient Education  2017 [employer] Prevention in the Home Falls can cause injuries. They can happen to people of all ages. There are many things you can do to make your home safe and to help prevent falls. What can I do on the outside of my home? Regularly fix the edges of walkways and driveways and fix any cracks. Remove anything that might make you trip as you walk through a door, such as a raised step or threshold. Trim any bushes or trees on the path to your home. Use bright outdoor lighting. Clear any walking paths of anything that might make someone trip, such as rocks or tools. Regularly check to see if handrails are loose or broken. Make sure that both sides of any steps have handrails. Any raised decks and porches should have guardrails on the edges. Have any leaves, snow, or ice cleared regularly. Use sand or salt on walking paths during winter. Clean up any spills in your garage right away. This includes oil or grease spills. What can I do in the bathroom? Use night lights. Install grab bars by the toilet and in the tub and shower. Do not use towel bars as grab bars. Use non-skid mats or decals in the tub or shower. If you need to sit down in the shower, use a plastic, non-slip stool. Keep the floor dry. Clean up any water that spills on the floor as soon as it happens. Remove soap buildup in the tub or shower regularly. Attach bath mats securely with double-sided non-slip rug tape. Do not have throw rugs and other things on the floor that can make you trip. What can I do in the bedroom? Use night lights. Make sure that you have a  light by your bed that is easy to reach. Do not use any sheets or blankets that are too big for your bed. They should not hang down onto the floor. Have a firm chair that has side arms. You can use this for support while you get dressed. Do not have throw rugs and other things on the floor that can make you trip. What can I do in the kitchen? Clean up any spills right away. Avoid walking on wet floors. Keep items that you use a lot in easy-to-reach places. If you need to reach something above you, use a strong step stool that has a grab bar. Keep electrical cords out of the way. Do not use floor polish or wax that makes floors slippery. If you must use wax, use non-skid floor wax. Do not have throw rugs and other things on the floor that can make you trip. What can I do with my stairs? Do not leave any items on the stairs. Make sure that there are handrails on both sides of the stairs and use them. Fix handrails that are broken or loose. Make sure that handrails are as long as the stairways. Check any carpeting to make sure that it is firmly attached to the stairs. Fix any carpet that is loose or worn. Avoid having throw rugs at the top or bottom of the stairs. If you  do have throw rugs, attach them to the floor with carpet tape. Make sure that you have a light switch at the top of the stairs and the bottom of the stairs. If you do not have them, ask someone to add them for you. What else can I do to help prevent falls? Wear shoes that: Do not have high heels. Have rubber bottoms. Are comfortable and fit you well. Are closed at the toe. Do not wear sandals. If you use a stepladder: Make sure that it is fully opened. Do not climb a closed stepladder. Make sure that both sides of the stepladder are locked into place. Ask someone to hold it for you, if possible. Clearly mark and make sure that you can see: Any grab bars or handrails. First and last steps. Where the edge of each step  is. Use tools that help you move around (mobility aids) if they are needed. These include: Canes. Walkers. Scooters. Crutches. Turn on the lights when you go into a dark area. Replace any light bulbs as soon as they burn out. Set up your furniture so you have a clear path. Avoid moving your furniture around. If any of your floors are uneven, fix them. If there are any pets around you, be aware of where they are. Review your medicines with your doctor. Some medicines can make you feel dizzy. This can increase your chance of falling. Ask your doctor what other things that you can do to help prevent falls. This information is not intended to replace advice given to you by your health care provider. Make sure you discuss any questions you have with your health care provider. Document Released: 08/28/2009 Document Revised: 04/08/2016 Document Reviewed: 12/06/2014 Elsevier Interactive Patient Education  2017 [employer].

## 2021-04-29 DIAGNOSIS — J3089 Other allergic rhinitis: Secondary | ICD-10-CM | POA: Diagnosis not present

## 2021-04-29 DIAGNOSIS — J301 Allergic rhinitis due to pollen: Secondary | ICD-10-CM | POA: Diagnosis not present

## 2021-05-02 ENCOUNTER — Other Ambulatory Visit: Payer: Self-pay

## 2021-05-02 ENCOUNTER — Ambulatory Visit (HOSPITAL_BASED_OUTPATIENT_CLINIC_OR_DEPARTMENT_OTHER)
Admission: RE | Admit: 2021-05-02 | Discharge: 2021-05-02 | Disposition: A | Discharge status: Home / Self Care | Payer: Medicare Other | Source: Ambulatory Visit | Attending: Physician Assistant | Admitting: Physician Assistant

## 2021-05-02 DIAGNOSIS — S43432A Superior glenoid labrum lesion of left shoulder, initial encounter: Secondary | ICD-10-CM | POA: Diagnosis not present

## 2021-05-02 DIAGNOSIS — R52 Pain, unspecified: Secondary | ICD-10-CM | POA: Diagnosis not present

## 2021-05-02 DIAGNOSIS — M19012 Primary osteoarthritis, left shoulder: Secondary | ICD-10-CM | POA: Diagnosis not present

## 2021-05-02 DIAGNOSIS — M7552 Bursitis of left shoulder: Secondary | ICD-10-CM | POA: Diagnosis not present

## 2021-05-02 DIAGNOSIS — M25412 Effusion, left shoulder: Secondary | ICD-10-CM | POA: Diagnosis not present

## 2021-05-08 DIAGNOSIS — J4540 Moderate persistent asthma, uncomplicated: Secondary | ICD-10-CM | POA: Diagnosis not present

## 2021-05-08 DIAGNOSIS — J301 Allergic rhinitis due to pollen: Secondary | ICD-10-CM | POA: Diagnosis not present

## 2021-05-08 DIAGNOSIS — J300 Vasomotor rhinitis: Secondary | ICD-10-CM | POA: Diagnosis not present

## 2021-05-08 DIAGNOSIS — J3089 Other allergic rhinitis: Secondary | ICD-10-CM | POA: Diagnosis not present

## 2021-05-09 ENCOUNTER — Other Ambulatory Visit: Payer: Self-pay | Admitting: Internal Medicine

## 2021-05-11 ENCOUNTER — Telehealth: Payer: Self-pay | Admitting: Pharmacist

## 2021-05-11 DIAGNOSIS — M25512 Pain in left shoulder: Secondary | ICD-10-CM | POA: Diagnosis not present

## 2021-05-11 DIAGNOSIS — M7542 Impingement syndrome of left shoulder: Secondary | ICD-10-CM | POA: Diagnosis not present

## 2021-05-11 NOTE — Chronic Care Management (AMB) (Signed)
Chronic Care Management Pharmacy Assistant   Name: Ryan Zhang  MRN: [id number] DOB: 10/31/1946   Reason for Encounter: Reschedule appointment    Medications: Outpatient Encounter Medications as of 05/11/2021  Medication Sig Note   acetaminophen (TYLENOL) 325 MG tablet Take 2 tablets (650 mg total) by mouth every 6 (six) hours as needed (mild pain; headache).    albuterol (PROVENTIL) (2.5 MG/3ML) 0.083% nebulizer solution Take 3 mLs (2.5 mg total) by nebulization every 6 (six) hours as needed for wheezing or shortness of breath.    albuterol (VENTOLIN HFA) 108 (90 Base) MCG/ACT inhaler Inhale 2 puffs into the lungs every 6 (six) hours as needed for wheezing or shortness of breath.    asenapine (SAPHRIS) 5 MG SUBL 24 hr tablet Place 5 mg under the tongue at bedtime.    augmented betamethasone dipropionate (DIPROLENE-AF) 0.05 % ointment Apply topically 2 (two) times daily as needed.    azelastine (ASTELIN) 0.1 % nasal spray Place 1 spray into both nostrils as needed for rhinitis. To replace Flonase    budesonide-formoterol (SYMBICORT) 160-4.5 MCG/ACT inhaler Inhale 2 puffs into the lungs 2 (two) times daily.     buPROPion (WELLBUTRIN XL) 150 MG 24 hr tablet Take 150 mg by mouth every morning.    buPROPion (WELLBUTRIN XL) 150 MG 24 hr tablet Take 150 mg by mouth daily.    clonazePAM (KLONOPIN) 0.5 MG tablet Take 0.5 mg by mouth See admin instructions. #2 at 8am, #1 at 2pm [facility])    clopidogrel (PLAVIX) 75 MG tablet Take 1 tablet (75 mg total) by mouth daily.    DULoxetine (CYMBALTA) 60 MG capsule Take 60 mg by mouth every morning.    EPIPEN 2-PAK 0.3 MG/0.3ML SOAJ injection Reported on 04/29/2016 01/01/2019: PRN   esomeprazole (NEXIUM) 40 MG capsule Take 40 mg by mouth every morning.     fexofenadine (ALLEGRA) 180 MG tablet Take 180 mg by mouth every morning. 8am [name])    fluticasone (VERAMYST) 27.5 MCG/SPRAY nasal spray Place 2 sprays into the nose daily as needed for rhinitis  or allergies.  02/26/2020: PRN [name])   lamoTRIgine (LAMICTAL) 25 MG tablet Take 50 mg by mouth at bedtime.    Lancets (ONETOUCH ULTRASOFT) lancets Check blood sugars no more than twice daily    losartan (COZAAR) 25 MG tablet Take 1 tablet (25 mg total) by mouth every evening.    montelukast (SINGULAIR) 10 MG tablet Take 10 mg by mouth at bedtime. [name]    Multiple Vitamin (MULTIVITAMIN) tablet Take 1 tablet by mouth daily.     nystatin ointment (MYCOSTATIN) Apply 1 application topically 2 (two) times daily as needed.    ONETOUCH VERIO test strip use to check blood sugar no more than twice daily    pioglitazone-metformin (ACTOPLUS MET) 15-850 MG tablet Take 1 tablet by mouth 2 (two) times daily with a meal.    simvastatin (ZOCOR) 40 MG tablet TAKE ONE TABLET BY MOUTH DAILY AT BEDTIME    solifenacin (VESICARE) 10 MG tablet Take 1 tablet (10 mg total) by mouth daily.    tamsulosin (FLOMAX) 0.4 MG CAPS capsule Take 1 capsule (0.4 mg total) by mouth daily after supper.    traZODone (DESYREL) 50 MG tablet Take 50 mg by mouth at bedtime as needed for sleep. [name] 02/26/2020: Takes 155m PRN (about 3 times per year)   No facility-administered encounter medications on file as of 05/11/2021.   Called patient to reschedule appointment with CPP in  July or august. Left voicemail to return call.  Patient states he is not interested in rescheduling his appointment. He states he has recently had two "pharmacist " visits and does not feel they have been any different then seeing his pcp.   Lizbeth Bark Clinical Pharmacist Assistant [phone]

## 2021-05-11 NOTE — Telephone Encounter (Signed)
Visits patient is referencing was outreach by [facility] CMA and Medicare AWV from nursing staff.  Will unenroll patient for [facility] pharmacy services.

## 2021-05-15 DIAGNOSIS — J3089 Other allergic rhinitis: Secondary | ICD-10-CM | POA: Diagnosis not present

## 2021-05-15 DIAGNOSIS — J301 Allergic rhinitis due to pollen: Secondary | ICD-10-CM | POA: Diagnosis not present

## 2021-05-20 DIAGNOSIS — J301 Allergic rhinitis due to pollen: Secondary | ICD-10-CM | POA: Diagnosis not present

## 2021-05-20 DIAGNOSIS — J3089 Other allergic rhinitis: Secondary | ICD-10-CM | POA: Diagnosis not present

## 2021-05-26 DIAGNOSIS — M25512 Pain in left shoulder: Secondary | ICD-10-CM | POA: Diagnosis not present

## 2021-05-27 DIAGNOSIS — J301 Allergic rhinitis due to pollen: Secondary | ICD-10-CM | POA: Diagnosis not present

## 2021-05-27 DIAGNOSIS — J3089 Other allergic rhinitis: Secondary | ICD-10-CM | POA: Diagnosis not present

## 2021-05-29 DIAGNOSIS — H00034 Abscess of left upper eyelid: Secondary | ICD-10-CM | POA: Diagnosis not present

## 2021-05-29 DIAGNOSIS — H00035 Abscess of left lower eyelid: Secondary | ICD-10-CM | POA: Diagnosis not present

## 2021-06-01 DIAGNOSIS — H00034 Abscess of left upper eyelid: Secondary | ICD-10-CM | POA: Diagnosis not present

## 2021-06-01 DIAGNOSIS — H00035 Abscess of left lower eyelid: Secondary | ICD-10-CM | POA: Diagnosis not present

## 2021-06-02 DIAGNOSIS — M25512 Pain in left shoulder: Secondary | ICD-10-CM | POA: Diagnosis not present

## 2021-06-03 DIAGNOSIS — J301 Allergic rhinitis due to pollen: Secondary | ICD-10-CM | POA: Diagnosis not present

## 2021-06-03 DIAGNOSIS — J3089 Other allergic rhinitis: Secondary | ICD-10-CM | POA: Diagnosis not present

## 2021-06-04 DIAGNOSIS — H00034 Abscess of left upper eyelid: Secondary | ICD-10-CM | POA: Diagnosis not present

## 2021-06-04 DIAGNOSIS — H00035 Abscess of left lower eyelid: Secondary | ICD-10-CM | POA: Diagnosis not present

## 2021-06-05 DIAGNOSIS — M25512 Pain in left shoulder: Secondary | ICD-10-CM | POA: Diagnosis not present

## 2021-06-08 DIAGNOSIS — M25512 Pain in left shoulder: Secondary | ICD-10-CM | POA: Diagnosis not present

## 2021-06-11 DIAGNOSIS — J301 Allergic rhinitis due to pollen: Secondary | ICD-10-CM | POA: Diagnosis not present

## 2021-06-11 DIAGNOSIS — J3089 Other allergic rhinitis: Secondary | ICD-10-CM | POA: Diagnosis not present

## 2021-06-12 DIAGNOSIS — M25512 Pain in left shoulder: Secondary | ICD-10-CM | POA: Diagnosis not present

## 2021-06-17 DIAGNOSIS — J3089 Other allergic rhinitis: Secondary | ICD-10-CM | POA: Diagnosis not present

## 2021-06-17 DIAGNOSIS — J301 Allergic rhinitis due to pollen: Secondary | ICD-10-CM | POA: Diagnosis not present

## 2021-06-19 DIAGNOSIS — H00035 Abscess of left lower eyelid: Secondary | ICD-10-CM | POA: Diagnosis not present

## 2021-06-19 DIAGNOSIS — Z961 Presence of intraocular lens: Secondary | ICD-10-CM | POA: Diagnosis not present

## 2021-06-24 DIAGNOSIS — J301 Allergic rhinitis due to pollen: Secondary | ICD-10-CM | POA: Diagnosis not present

## 2021-06-24 DIAGNOSIS — J3089 Other allergic rhinitis: Secondary | ICD-10-CM | POA: Diagnosis not present

## 2021-07-01 DIAGNOSIS — J3089 Other allergic rhinitis: Secondary | ICD-10-CM | POA: Diagnosis not present

## 2021-07-01 DIAGNOSIS — J301 Allergic rhinitis due to pollen: Secondary | ICD-10-CM | POA: Diagnosis not present

## 2021-07-08 DIAGNOSIS — J301 Allergic rhinitis due to pollen: Secondary | ICD-10-CM | POA: Diagnosis not present

## 2021-07-08 DIAGNOSIS — J3089 Other allergic rhinitis: Secondary | ICD-10-CM | POA: Diagnosis not present

## 2021-07-15 DIAGNOSIS — J3089 Other allergic rhinitis: Secondary | ICD-10-CM | POA: Diagnosis not present

## 2021-07-15 DIAGNOSIS — J301 Allergic rhinitis due to pollen: Secondary | ICD-10-CM | POA: Diagnosis not present

## 2021-07-22 DIAGNOSIS — J301 Allergic rhinitis due to pollen: Secondary | ICD-10-CM | POA: Diagnosis not present

## 2021-07-22 DIAGNOSIS — J3089 Other allergic rhinitis: Secondary | ICD-10-CM | POA: Diagnosis not present

## 2021-07-29 DIAGNOSIS — J3089 Other allergic rhinitis: Secondary | ICD-10-CM | POA: Diagnosis not present

## 2021-07-29 DIAGNOSIS — J301 Allergic rhinitis due to pollen: Secondary | ICD-10-CM | POA: Diagnosis not present

## 2021-08-05 DIAGNOSIS — J3089 Other allergic rhinitis: Secondary | ICD-10-CM | POA: Diagnosis not present

## 2021-08-05 DIAGNOSIS — J301 Allergic rhinitis due to pollen: Secondary | ICD-10-CM | POA: Diagnosis not present

## 2021-08-12 DIAGNOSIS — J301 Allergic rhinitis due to pollen: Secondary | ICD-10-CM | POA: Diagnosis not present

## 2021-08-12 DIAGNOSIS — J3089 Other allergic rhinitis: Secondary | ICD-10-CM | POA: Diagnosis not present

## 2021-08-13 ENCOUNTER — Other Ambulatory Visit: Payer: Self-pay | Admitting: Internal Medicine

## 2021-08-19 DIAGNOSIS — J3089 Other allergic rhinitis: Secondary | ICD-10-CM | POA: Diagnosis not present

## 2021-08-19 DIAGNOSIS — J301 Allergic rhinitis due to pollen: Secondary | ICD-10-CM | POA: Diagnosis not present

## 2021-08-26 DIAGNOSIS — J3089 Other allergic rhinitis: Secondary | ICD-10-CM | POA: Diagnosis not present

## 2021-08-26 DIAGNOSIS — J301 Allergic rhinitis due to pollen: Secondary | ICD-10-CM | POA: Diagnosis not present

## 2021-08-27 ENCOUNTER — Encounter: Payer: Self-pay | Admitting: Internal Medicine

## 2021-08-27 DIAGNOSIS — J3089 Other allergic rhinitis: Secondary | ICD-10-CM | POA: Diagnosis not present

## 2021-08-27 DIAGNOSIS — H35033 Hypertensive retinopathy, bilateral: Secondary | ICD-10-CM | POA: Diagnosis not present

## 2021-08-27 DIAGNOSIS — J301 Allergic rhinitis due to pollen: Secondary | ICD-10-CM | POA: Diagnosis not present

## 2021-08-27 DIAGNOSIS — E1165 Type 2 diabetes mellitus with hyperglycemia: Secondary | ICD-10-CM | POA: Diagnosis not present

## 2021-08-27 DIAGNOSIS — H35371 Puckering of macula, right eye: Secondary | ICD-10-CM | POA: Diagnosis not present

## 2021-08-27 DIAGNOSIS — Z961 Presence of intraocular lens: Secondary | ICD-10-CM | POA: Diagnosis not present

## 2021-08-27 LAB — HM DIABETES EYE EXAM

## 2021-09-08 DIAGNOSIS — H90A32 Mixed conductive and sensorineural hearing loss, unilateral, left ear with restricted hearing on the contralateral side: Secondary | ICD-10-CM | POA: Diagnosis not present

## 2021-09-08 DIAGNOSIS — H90A21 Sensorineural hearing loss, unilateral, right ear, with restricted hearing on the contralateral side: Secondary | ICD-10-CM | POA: Diagnosis not present

## 2021-09-09 DIAGNOSIS — J301 Allergic rhinitis due to pollen: Secondary | ICD-10-CM | POA: Diagnosis not present

## 2021-09-09 DIAGNOSIS — J3089 Other allergic rhinitis: Secondary | ICD-10-CM | POA: Diagnosis not present

## 2021-09-14 ENCOUNTER — Other Ambulatory Visit: Payer: Self-pay

## 2021-09-14 ENCOUNTER — Encounter: Payer: Self-pay | Admitting: Internal Medicine

## 2021-09-14 ENCOUNTER — Ambulatory Visit (INDEPENDENT_AMBULATORY_CARE_PROVIDER_SITE_OTHER): Payer: Medicare Other | Admitting: Internal Medicine

## 2021-09-14 VITALS — BP 126/74 | HR 78 | Temp 97.8°F | Resp 18 | Ht 70.0 in | Wt 271.5 lb

## 2021-09-14 DIAGNOSIS — I10 Essential (primary) hypertension: Secondary | ICD-10-CM

## 2021-09-14 DIAGNOSIS — R269 Unspecified abnormalities of gait and mobility: Secondary | ICD-10-CM | POA: Diagnosis not present

## 2021-09-14 DIAGNOSIS — E1142 Type 2 diabetes mellitus with diabetic polyneuropathy: Secondary | ICD-10-CM

## 2021-09-14 DIAGNOSIS — I779 Disorder of arteries and arterioles, unspecified: Secondary | ICD-10-CM

## 2021-09-14 LAB — BASIC METABOLIC PANEL
BUN: 19 mg/dL (ref 6–23)
CO2: 29 mEq/L (ref 19–32)
Calcium: 8.9 mg/dL (ref 8.4–10.5)
Chloride: 104 mEq/L (ref 96–112)
Creatinine, Ser: 0.95 mg/dL (ref 0.40–1.50)
GFR: 79.53 mL/min (ref >60.00)
Glucose, Bld: 162 mg/dL — ABNORMAL HIGH (ref 70–99)
Potassium: 4.9 mEq/L (ref 3.5–5.1)
Sodium: 140 mEq/L (ref 135–145)

## 2021-09-14 LAB — TSH: TSH: 1.86 u[IU]/mL (ref 0.35–5.50)

## 2021-09-14 LAB — VITAMIN D 25 HYDROXY (VIT D DEFICIENCY, FRACTURES): VITD: 42.84 ng/mL (ref 30.00–100.00)

## 2021-09-14 LAB — HEMOGLOBIN A1C: Hgb A1c MFr Bld: 6.8 % — ABNORMAL HIGH (ref 4.6–6.5)

## 2021-09-14 NOTE — Progress Notes (Signed)
Subjective:    Patient ID: Ryan Zhang, male    DOB: 1946-07-08, 75 y.o.   MRN: [id number]  DOS:  09/14/2021 Type of visit - description: Routine checkup  Since the last visit, in general feels well. We addressed his chronic medical problems.  Good medication compliance.  I asked specifically about falls and he admits to a couple of falls lately, reports lack of balance. Denies dizziness per se, no headache, diplopia or slurred speech.  No motor deficit.   Review of Systems See above   Past Medical History:  Diagnosis Date   Allergy    allergy shots Dr. Velora Heckler   Anemia    Anxiety    Asthma    moderate persistant   Bipolar affective (HCC)    Cataract    both eyes   COPD (chronic obstructive pulmonary disease) (HCC)    Depression    Diabetes mellitus    DJD (degenerative joint disease)    Dysphagia    Gallstones    hx of, s/p cholecystectomy   GERD (gastroesophageal reflux disease)    Hepatitis A    as teenager 60's   Hollenhorst plaque    right eye   Hyperlipidemia    Hypertension    Kidney stones    hx of   Meniere's disease    Motility disorder, esophageal    Obesity    Pancreatitis    Personal history of colonic polyps 11/2004   hyperplastic.   Stroke [facility])    per MRI    Past Surgical History:  Procedure Laterality Date   CATARACT EXTRACTION Bilateral 09/2015 and 10/2015   CHOLECYSTECTOMY     COLONOSCOPY     TOTAL KNEE ARTHROPLASTY Bilateral    both knees    Allergies as of 09/14/2021       Reactions   Celexa  [citalopram Hydrobromide] Other (See Comments)   Depakote  [divalproex Sodium] Other (See Comments)   Methocarbamol    Other reaction(s): Hallucinations   Paroxetine Hcl    Other reaction(s): Insomnia   Smz-tmp Ds [sulfamethoxazole-trimethoprim] Nausea And Vomiting   Other    "Symbrax" alteration in blood sugar, pt unsure if low or high blood sugar   Percocet [oxycodone-acetaminophen] Other (See Comments)   Hyperactivity    Risperidone Other (See Comments)   REACTION: hyper        Medication List        Accurate as of September 14, 2021  6:06 PM. If you have any questions, ask your nurse or doctor.          acetaminophen 325 MG tablet Commonly known as: TYLENOL Take 2 tablets (650 mg total) by mouth every 6 (six) hours as needed (mild pain; headache).   albuterol 108 (90 Base) MCG/ACT inhaler Commonly known as: VENTOLIN HFA Inhale 2 puffs into the lungs every 6 (six) hours as needed for wheezing or shortness of breath.   albuterol (2.5 MG/3ML) 0.083% nebulizer solution Commonly known as: PROVENTIL Take 3 mLs (2.5 mg total) by nebulization every 6 (six) hours as needed for wheezing or shortness of breath.   asenapine 5 MG Subl 24 hr tablet Commonly known as: SAPHRIS Place 5 mg under the tongue at bedtime.   augmented betamethasone dipropionate 0.05 % ointment Commonly known as: DIPROLENE-AF Apply topically 2 (two) times daily as needed.   azelastine 0.1 % nasal spray Commonly known as: ASTELIN Place 1 spray into both nostrils as needed for rhinitis. To replace Flonase   budesonide-formoterol 160-4.5  MCG/ACT inhaler Commonly known as: SYMBICORT Inhale 2 puffs into the lungs 2 (two) times daily.   buPROPion 150 MG 24 hr tablet Commonly known as: WELLBUTRIN XL Take 150 mg by mouth daily. What changed: Another medication with the same name was removed. Continue taking this medication, and follow the directions you see here. Changed by: Kathlene November, MD   clonazePAM 0.5 MG tablet Commonly known as: KLONOPIN Take 0.5 mg by mouth See admin instructions. #2 at 8am, #1 at 2pm [facility])   clopidogrel 75 MG tablet Commonly known as: PLAVIX Take 1 tablet (75 mg total) by mouth daily.   DULoxetine 60 MG capsule Commonly known as: CYMBALTA Take 60 mg by mouth every morning.   EpiPen 2-Pak 0.3 mg/0.3 mL Soaj injection Generic drug: EPINEPHrine Reported on 04/29/2016   esomeprazole 40 MG  capsule Commonly known as: NEXIUM Take 40 mg by mouth every morning.   fexofenadine 180 MG tablet Commonly known as: ALLEGRA Take 180 mg by mouth every morning. 8am [name])   fluticasone 27.5 MCG/SPRAY nasal spray Commonly known as: VERAMYST Place 2 sprays into the nose daily as needed for rhinitis or allergies.   lamoTRIgine 25 MG tablet Commonly known as: LAMICTAL Take 50 mg by mouth at bedtime.   losartan 25 MG tablet Commonly known as: COZAAR Take 1 tablet (25 mg total) by mouth every evening.   montelukast 10 MG tablet Commonly known as: SINGULAIR Take 10 mg by mouth at bedtime. [name]   multivitamin tablet Take 1 tablet by mouth daily.   nystatin ointment Commonly known as: MYCOSTATIN Apply 1 application topically 2 (two) times daily as needed.   onetouch ultrasoft lancets Check blood sugars no more than twice daily   OneTouch Verio test strip Generic drug: glucose blood use to check blood sugar no more than twice daily   pioglitazone-metformin 15-850 MG tablet Commonly known as: ACTOPLUS MET TAKE ONE TABLET BY MOUTH TWICE DAILY with a meal   simvastatin 40 MG tablet Commonly known as: ZOCOR TAKE ONE TABLET BY MOUTH DAILY AT BEDTIME   solifenacin 10 MG tablet Commonly known as: VESICARE Take 1 tablet (10 mg total) by mouth daily.   tamsulosin 0.4 MG Caps capsule Commonly known as: FLOMAX Take 1 capsule (0.4 mg total) by mouth daily after supper.   traZODone 50 MG tablet Commonly known as: DESYREL Take 50 mg by mouth at bedtime as needed for sleep. [name]           Objective:   Physical Exam BP 126/74 (BP Location: Left Arm, Patient Position: Sitting, Cuff Size: Normal)   Pulse 78   Temp 97.8 F (36.6 C) (Oral)   Resp 18   Ht 5' 10"  (1.778 m)   Wt 271 lb 8 oz (123.2 kg)   SpO2 97%   BMI 38.96 kg/m  General:   Well developed, NAD, BMI noted. HEENT:  Normocephalic . Face symmetric, atraumatic Lungs:  CTA B Normal  respiratory effort, no intercostal retractions, no accessory muscle use. Heart: RRR,  no murmur.  Lower extremities: no pretibial edema bilaterally  Skin: Not pale. Not jaundice Neurologic:  alert & oriented X3.  Speech normal, gait appropriate for age and unassisted. Transfer is somewhat difficult but did not need assistance  psych--  Cognition and judgment appear intact.  Cooperative with normal attention span and concentration.  Behavior appropriate. No anxious or depressed appearing.      Assessment    Assessment DM HTN Hyperlipidemia Morbid obesity (BMI 39  plus DM) Bipolar, Depression ------ see psychiatry, Dr Casimiro Needle DJD-- hydrocodone rarely rx by [place] ortho Asthma-Allergies ------------ Dr [name]  GERD, h/o dysphagia (chronic, neurogenic-transfer dysphagia? See GI note 12-2011) Neuro: --? stroke : saw neuro 2013 d/t B transient visual loss, ASA changed to plavix.  --See CTA, MRIs of head-neck report  --Saw neuro 11-2013: fall, syncope,parkinsonism d/t sapharis? CV: Enlarged ascending Ao  --per CT 12-2015,CT chest 08-2016 stable.    --MRI chest 10/2017 stable, no further routine x-rays recommended --CT chest 07/2018: Atherosclerotic changes in the thoracic aorta.  Nl LE arterial dopplers 2015 Carotid artery disease: Per ultrasound 2011, last [place] 2018: <50% B, rx medical treatment, recheck 08/2019 < 1-39% B, stable 2 years  CT chest  ---RML  14m: 07-2014, CT 12-2015, CT 08-2016, MRI chest 12,2018, CT chest 07/2018:Stable  HOH: L deaf L, R  hearing aid H/o Mnire's disease  Iron deficiency anemia: Noted 01/2018, + Hemoccult, 09-2018: Colonoscopy and EGD were completely normal  COVID-19 infection 01-2021  PLAN: DM: On Actos plus met, checking  A1c, TSH HTN: Seems well controlled, continue losartan, check BMP Gait disorder: Reports imbalance, no stroke symptoms, no dizziness per se, I suspect this is multifactorial. Strongly encouraged to use a cane and/or do physical  therapy. Consequences of falls discussed. Check vitamin D levels and start OTC supplements as vitamin D ( in theory could help balance). Bipolar, depression: Per psychiatry, symptoms well controlled, started Wellbutrin (previously intolerant) and is tolerating well. Carotid artery disease: Due for ultrasound, will arrange, continue Plavix. Preventive care: Up-to-date on COVID vaccines and flu shot. RTC 6 months      This visit occurred during the SARS-CoV-2 public health emergency.  Safety protocols were in place, including screening questions prior to the visit, additional usage of staff PPE, and extensive cleaning of exam room while observing appropriate contact time as indicated for disinfecting solutions.

## 2021-09-14 NOTE — Patient Instructions (Addendum)
Start taking vitamin D: 1000 units daily.  Consider using a cane and/or to physical therapy to prevent a fall.  See the information below.  Check the  blood pressure twice a month BP GOAL is between 110/65 and  135/85. If it is consistently higher or lower, let me know    GO TO THE LAB : Get the blood work     GO TO THE FRONT DESK, PLEASE SCHEDULE YOUR APPOINTMENTS Come back for a physical exam in 6 months    Fall Prevention in the Home, Adult Falls can cause injuries and can affect people from all age groups. There are many simple things that you can do to make your home safe and to help prevent falls. Ask for help when making these changes, if needed. What actions can I take to prevent falls? General instructions Use good lighting in all rooms. Replace any light bulbs that burn out. Turn on lights if it is dark. Use night-lights. Place frequently used items in easy-to-reach places. Lower the shelves around your home if necessary. Set up furniture so that there are clear paths around it. Avoid moving your furniture around. Remove throw rugs and other tripping hazards from the floor. Avoid walking on wet floors. Fix any uneven floor surfaces. Add color or contrast paint or tape to grab bars and handrails in your home. Place contrasting color strips on the first and last steps of stairways. When you use a stepladder, make sure that it is completely opened and that the sides are firmly locked. Have someone hold the ladder while you are using it. Do not climb a closed stepladder. Be aware of any and all pets. What can I do in the bathroom?   Keep the floor dry. Immediately clean up any water that spills onto the floor. Remove soap buildup in the tub or shower on a regular basis. Use non-skid mats or decals on the floor of the tub or shower. Attach bath mats securely with double-sided, non-slip rug tape. If you need to sit down while you are in the shower, use a plastic, non-slip  stool. Install grab bars by the toilet and in the tub and shower. Do not use towel bars as grab bars. What can I do in the bedroom? Make sure that a bedside light is easy to reach. Do not use oversized bedding that drapes onto the floor. Have a firm chair that has side arms to use for getting dressed. What can I do in the kitchen? Clean up any spills right away. If you need to reach for something above you, use a sturdy step stool that has a grab bar. Keep electrical cables out of the way. Do not use floor polish or wax that makes floors slippery. If you must use wax, make sure that it is non-skid floor wax. What can I do in the stairways? Do not leave any items on the stairs. Make sure that you have a light switch at the top of the stairs and the bottom of the stairs. Have them installed if you do not have them. Make sure that there are handrails on both sides of the stairs. Fix handrails that are broken or loose. Make sure that handrails are as long as the stairways. Install non-slip stair treads on all stairs in your home. Avoid having throw rugs at the top or bottom of stairways, or secure the rugs with carpet tape to prevent them from moving. Choose a carpet design that does not hide the  edge of steps on the stairway. Check any carpeting to make sure that it is firmly attached to the stairs. Fix any carpet that is loose or worn. What can I do on the outside of my home? Use bright outdoor lighting. Regularly repair the edges of walkways and driveways and fix any cracks. Remove high doorway thresholds. Trim any shrubbery on the main path into your home. Regularly check that handrails are securely fastened and in good repair. Both sides of any steps should have handrails. Install guardrails along the edges of any raised decks or porches. Clear walkways of debris and clutter, including tools and rocks. Have leaves, snow, and ice cleared regularly. Use sand or salt on walkways during  winter months. In the garage, clean up any spills right away, including grease or oil spills. What other actions can I take? Wear closed-toe shoes that fit well and support your feet. Wear shoes that have rubber soles or low heels. Use mobility aids as needed, such as canes, walkers, scooters, and crutches. Review your medicines with your health care provider. Some medicines can cause dizziness or changes in blood pressure, which increase your risk of falling. Talk with your health care provider about other ways that you can decrease your risk of falls. This may include working with a physical therapist or trainer to improve your strength, balance, and endurance. Where to find more information Centers for Disease Control and Prevention, STEADI: TVDivision.uy [employer] on Aging: RingConnections.si Contact a health care provider if: You are afraid of falling at home. You feel weak, drowsy, or dizzy at home. You fall at home. Summary There are many simple things that you can do to make your home safe and to help prevent falls. Ways to make your home safe include removing tripping hazards and installing grab bars in the bathroom. Ask for help when making these changes in your home. This information is not intended to replace advice given to you by your health care provider. Make sure you discuss any questions you have with your health care provider. Document Revised: 10/14/2017 Document Reviewed: 06/16/2017 Elsevier Patient Education  2021 [employer].

## 2021-09-14 NOTE — Assessment & Plan Note (Signed)
DM: On Actos plus met, checking  A1c, TSH HTN: Seems well controlled, continue losartan, check BMP Gait disorder: Reports imbalance, no stroke symptoms, no dizziness per se, I suspect this is multifactorial. Strongly encouraged to use a cane and/or do physical therapy. Consequences of falls discussed. Check vitamin D levels and start OTC supplements as vitamin D ( in theory could help balance). Bipolar, depression: Per psychiatry, symptoms well controlled, started Wellbutrin (previously intolerant) and is tolerating well. Carotid artery disease: Due for ultrasound, will arrange, continue Plavix. Preventive care: Up-to-date on COVID vaccines and flu shot. RTC 6 months

## 2021-09-16 DIAGNOSIS — J3089 Other allergic rhinitis: Secondary | ICD-10-CM | POA: Diagnosis not present

## 2021-09-16 DIAGNOSIS — J301 Allergic rhinitis due to pollen: Secondary | ICD-10-CM | POA: Diagnosis not present

## 2021-09-22 ENCOUNTER — Ambulatory Visit (HOSPITAL_COMMUNITY)
Admission: RE | Admit: 2021-09-22 | Discharge: 2021-09-22 | Disposition: A | Discharge status: Home / Self Care | Payer: Medicare Other | Source: Ambulatory Visit | Attending: Cardiology | Admitting: Cardiology

## 2021-09-22 ENCOUNTER — Other Ambulatory Visit: Payer: Self-pay

## 2021-09-22 DIAGNOSIS — I779 Disorder of arteries and arterioles, unspecified: Secondary | ICD-10-CM | POA: Diagnosis not present

## 2021-09-23 DIAGNOSIS — J3089 Other allergic rhinitis: Secondary | ICD-10-CM | POA: Diagnosis not present

## 2021-09-23 DIAGNOSIS — J301 Allergic rhinitis due to pollen: Secondary | ICD-10-CM | POA: Diagnosis not present

## 2021-09-28 ENCOUNTER — Encounter: Payer: Self-pay | Admitting: Internal Medicine

## 2021-09-28 DIAGNOSIS — R269 Unspecified abnormalities of gait and mobility: Secondary | ICD-10-CM

## 2021-09-28 DIAGNOSIS — W19XXXS Unspecified fall, sequela: Secondary | ICD-10-CM

## 2021-09-30 DIAGNOSIS — J301 Allergic rhinitis due to pollen: Secondary | ICD-10-CM | POA: Diagnosis not present

## 2021-09-30 DIAGNOSIS — J3089 Other allergic rhinitis: Secondary | ICD-10-CM | POA: Diagnosis not present

## 2021-10-02 DIAGNOSIS — J3089 Other allergic rhinitis: Secondary | ICD-10-CM | POA: Diagnosis not present

## 2021-10-02 DIAGNOSIS — J301 Allergic rhinitis due to pollen: Secondary | ICD-10-CM | POA: Diagnosis not present

## 2021-10-05 DIAGNOSIS — R262 Difficulty in walking, not elsewhere classified: Secondary | ICD-10-CM | POA: Diagnosis not present

## 2021-10-05 DIAGNOSIS — Z9181 History of falling: Secondary | ICD-10-CM | POA: Diagnosis not present

## 2021-10-05 DIAGNOSIS — J3089 Other allergic rhinitis: Secondary | ICD-10-CM | POA: Diagnosis not present

## 2021-10-05 DIAGNOSIS — Z723 Lack of physical exercise: Secondary | ICD-10-CM | POA: Diagnosis not present

## 2021-10-05 DIAGNOSIS — J301 Allergic rhinitis due to pollen: Secondary | ICD-10-CM | POA: Diagnosis not present

## 2021-10-05 DIAGNOSIS — M6281 Muscle weakness (generalized): Secondary | ICD-10-CM | POA: Diagnosis not present

## 2021-10-06 ENCOUNTER — Telehealth: Payer: Self-pay

## 2021-10-06 NOTE — Telephone Encounter (Signed)
Plan of care signed and faxed to [facility] at [phone]. Form sent for scanning.  

## 2021-10-07 DIAGNOSIS — M6281 Muscle weakness (generalized): Secondary | ICD-10-CM | POA: Diagnosis not present

## 2021-10-07 DIAGNOSIS — Z9181 History of falling: Secondary | ICD-10-CM | POA: Diagnosis not present

## 2021-10-07 DIAGNOSIS — Z723 Lack of physical exercise: Secondary | ICD-10-CM | POA: Diagnosis not present

## 2021-10-07 DIAGNOSIS — J301 Allergic rhinitis due to pollen: Secondary | ICD-10-CM | POA: Diagnosis not present

## 2021-10-07 DIAGNOSIS — R262 Difficulty in walking, not elsewhere classified: Secondary | ICD-10-CM | POA: Diagnosis not present

## 2021-10-12 DIAGNOSIS — M6281 Muscle weakness (generalized): Secondary | ICD-10-CM | POA: Diagnosis not present

## 2021-10-12 DIAGNOSIS — Z9181 History of falling: Secondary | ICD-10-CM | POA: Diagnosis not present

## 2021-10-12 DIAGNOSIS — R262 Difficulty in walking, not elsewhere classified: Secondary | ICD-10-CM | POA: Diagnosis not present

## 2021-10-12 DIAGNOSIS — Z723 Lack of physical exercise: Secondary | ICD-10-CM | POA: Diagnosis not present

## 2021-10-13 DIAGNOSIS — J3089 Other allergic rhinitis: Secondary | ICD-10-CM | POA: Diagnosis not present

## 2021-10-13 DIAGNOSIS — J301 Allergic rhinitis due to pollen: Secondary | ICD-10-CM | POA: Diagnosis not present

## 2021-10-15 DIAGNOSIS — R262 Difficulty in walking, not elsewhere classified: Secondary | ICD-10-CM | POA: Diagnosis not present

## 2021-10-15 DIAGNOSIS — Z723 Lack of physical exercise: Secondary | ICD-10-CM | POA: Diagnosis not present

## 2021-10-15 DIAGNOSIS — M6281 Muscle weakness (generalized): Secondary | ICD-10-CM | POA: Diagnosis not present

## 2021-10-15 DIAGNOSIS — Z9181 History of falling: Secondary | ICD-10-CM | POA: Diagnosis not present

## 2021-10-19 DIAGNOSIS — Z723 Lack of physical exercise: Secondary | ICD-10-CM | POA: Diagnosis not present

## 2021-10-19 DIAGNOSIS — Z9181 History of falling: Secondary | ICD-10-CM | POA: Diagnosis not present

## 2021-10-19 DIAGNOSIS — M6281 Muscle weakness (generalized): Secondary | ICD-10-CM | POA: Diagnosis not present

## 2021-10-19 DIAGNOSIS — R262 Difficulty in walking, not elsewhere classified: Secondary | ICD-10-CM | POA: Diagnosis not present

## 2021-10-22 DIAGNOSIS — Z9181 History of falling: Secondary | ICD-10-CM | POA: Diagnosis not present

## 2021-10-22 DIAGNOSIS — R262 Difficulty in walking, not elsewhere classified: Secondary | ICD-10-CM | POA: Diagnosis not present

## 2021-10-22 DIAGNOSIS — Z723 Lack of physical exercise: Secondary | ICD-10-CM | POA: Diagnosis not present

## 2021-10-22 DIAGNOSIS — M6281 Muscle weakness (generalized): Secondary | ICD-10-CM | POA: Diagnosis not present

## 2021-10-24 ENCOUNTER — Other Ambulatory Visit: Payer: Self-pay | Admitting: Internal Medicine

## 2021-10-26 DIAGNOSIS — Z9181 History of falling: Secondary | ICD-10-CM | POA: Diagnosis not present

## 2021-10-26 DIAGNOSIS — R262 Difficulty in walking, not elsewhere classified: Secondary | ICD-10-CM | POA: Diagnosis not present

## 2021-10-26 DIAGNOSIS — Z723 Lack of physical exercise: Secondary | ICD-10-CM | POA: Diagnosis not present

## 2021-10-26 DIAGNOSIS — M6281 Muscle weakness (generalized): Secondary | ICD-10-CM | POA: Diagnosis not present

## 2021-10-27 DIAGNOSIS — J3089 Other allergic rhinitis: Secondary | ICD-10-CM | POA: Diagnosis not present

## 2021-10-27 DIAGNOSIS — J301 Allergic rhinitis due to pollen: Secondary | ICD-10-CM | POA: Diagnosis not present

## 2021-10-29 DIAGNOSIS — Z9181 History of falling: Secondary | ICD-10-CM | POA: Diagnosis not present

## 2021-10-29 DIAGNOSIS — R262 Difficulty in walking, not elsewhere classified: Secondary | ICD-10-CM | POA: Diagnosis not present

## 2021-10-29 DIAGNOSIS — Z723 Lack of physical exercise: Secondary | ICD-10-CM | POA: Diagnosis not present

## 2021-10-29 DIAGNOSIS — M6281 Muscle weakness (generalized): Secondary | ICD-10-CM | POA: Diagnosis not present

## 2021-11-03 DIAGNOSIS — J301 Allergic rhinitis due to pollen: Secondary | ICD-10-CM | POA: Diagnosis not present

## 2021-11-03 DIAGNOSIS — J3089 Other allergic rhinitis: Secondary | ICD-10-CM | POA: Diagnosis not present

## 2021-11-07 ENCOUNTER — Other Ambulatory Visit: Payer: Self-pay | Admitting: Internal Medicine

## 2021-11-10 DIAGNOSIS — R262 Difficulty in walking, not elsewhere classified: Secondary | ICD-10-CM | POA: Diagnosis not present

## 2021-11-10 DIAGNOSIS — Z723 Lack of physical exercise: Secondary | ICD-10-CM | POA: Diagnosis not present

## 2021-11-10 DIAGNOSIS — Z9181 History of falling: Secondary | ICD-10-CM | POA: Diagnosis not present

## 2021-11-10 DIAGNOSIS — M6281 Muscle weakness (generalized): Secondary | ICD-10-CM | POA: Diagnosis not present

## 2021-11-11 ENCOUNTER — Telehealth: Payer: Self-pay

## 2021-11-11 DIAGNOSIS — J301 Allergic rhinitis due to pollen: Secondary | ICD-10-CM | POA: Diagnosis not present

## 2021-11-11 DIAGNOSIS — J3089 Other allergic rhinitis: Secondary | ICD-10-CM | POA: Diagnosis not present

## 2021-11-11 NOTE — Telephone Encounter (Signed)
Plan of care signed and faxed back to [facility] at [phone]. Form sent for scanning.  

## 2021-11-12 DIAGNOSIS — Z723 Lack of physical exercise: Secondary | ICD-10-CM | POA: Diagnosis not present

## 2021-11-12 DIAGNOSIS — R262 Difficulty in walking, not elsewhere classified: Secondary | ICD-10-CM | POA: Diagnosis not present

## 2021-11-12 DIAGNOSIS — Z9181 History of falling: Secondary | ICD-10-CM | POA: Diagnosis not present

## 2021-11-12 DIAGNOSIS — M6281 Muscle weakness (generalized): Secondary | ICD-10-CM | POA: Diagnosis not present

## 2021-11-14 ENCOUNTER — Other Ambulatory Visit: Payer: Self-pay | Admitting: Internal Medicine

## 2021-11-17 DIAGNOSIS — M6281 Muscle weakness (generalized): Secondary | ICD-10-CM | POA: Diagnosis not present

## 2021-11-17 DIAGNOSIS — Z9181 History of falling: Secondary | ICD-10-CM | POA: Diagnosis not present

## 2021-11-17 DIAGNOSIS — R262 Difficulty in walking, not elsewhere classified: Secondary | ICD-10-CM | POA: Diagnosis not present

## 2021-11-17 DIAGNOSIS — Z723 Lack of physical exercise: Secondary | ICD-10-CM | POA: Diagnosis not present

## 2021-11-18 DIAGNOSIS — J3089 Other allergic rhinitis: Secondary | ICD-10-CM | POA: Diagnosis not present

## 2021-11-18 DIAGNOSIS — J301 Allergic rhinitis due to pollen: Secondary | ICD-10-CM | POA: Diagnosis not present

## 2021-11-19 DIAGNOSIS — M6281 Muscle weakness (generalized): Secondary | ICD-10-CM | POA: Diagnosis not present

## 2021-11-19 DIAGNOSIS — Z723 Lack of physical exercise: Secondary | ICD-10-CM | POA: Diagnosis not present

## 2021-11-19 DIAGNOSIS — Z9181 History of falling: Secondary | ICD-10-CM | POA: Diagnosis not present

## 2021-11-19 DIAGNOSIS — R262 Difficulty in walking, not elsewhere classified: Secondary | ICD-10-CM | POA: Diagnosis not present

## 2021-11-23 DIAGNOSIS — M6281 Muscle weakness (generalized): Secondary | ICD-10-CM | POA: Diagnosis not present

## 2021-11-23 DIAGNOSIS — Z9181 History of falling: Secondary | ICD-10-CM | POA: Diagnosis not present

## 2021-11-23 DIAGNOSIS — R262 Difficulty in walking, not elsewhere classified: Secondary | ICD-10-CM | POA: Diagnosis not present

## 2021-11-23 DIAGNOSIS — Z723 Lack of physical exercise: Secondary | ICD-10-CM | POA: Diagnosis not present

## 2021-11-25 DIAGNOSIS — J3089 Other allergic rhinitis: Secondary | ICD-10-CM | POA: Diagnosis not present

## 2021-11-25 DIAGNOSIS — J301 Allergic rhinitis due to pollen: Secondary | ICD-10-CM | POA: Diagnosis not present

## 2021-11-26 DIAGNOSIS — Z9181 History of falling: Secondary | ICD-10-CM | POA: Diagnosis not present

## 2021-11-26 DIAGNOSIS — Z723 Lack of physical exercise: Secondary | ICD-10-CM | POA: Diagnosis not present

## 2021-11-26 DIAGNOSIS — R262 Difficulty in walking, not elsewhere classified: Secondary | ICD-10-CM | POA: Diagnosis not present

## 2021-11-26 DIAGNOSIS — M6281 Muscle weakness (generalized): Secondary | ICD-10-CM | POA: Diagnosis not present

## 2021-12-01 DIAGNOSIS — Z723 Lack of physical exercise: Secondary | ICD-10-CM | POA: Diagnosis not present

## 2021-12-01 DIAGNOSIS — Z9181 History of falling: Secondary | ICD-10-CM | POA: Diagnosis not present

## 2021-12-01 DIAGNOSIS — M6281 Muscle weakness (generalized): Secondary | ICD-10-CM | POA: Diagnosis not present

## 2021-12-01 DIAGNOSIS — R262 Difficulty in walking, not elsewhere classified: Secondary | ICD-10-CM | POA: Diagnosis not present

## 2021-12-02 DIAGNOSIS — J3089 Other allergic rhinitis: Secondary | ICD-10-CM | POA: Diagnosis not present

## 2021-12-02 DIAGNOSIS — J301 Allergic rhinitis due to pollen: Secondary | ICD-10-CM | POA: Diagnosis not present

## 2021-12-03 DIAGNOSIS — Z9181 History of falling: Secondary | ICD-10-CM | POA: Diagnosis not present

## 2021-12-03 DIAGNOSIS — Z723 Lack of physical exercise: Secondary | ICD-10-CM | POA: Diagnosis not present

## 2021-12-03 DIAGNOSIS — M6281 Muscle weakness (generalized): Secondary | ICD-10-CM | POA: Diagnosis not present

## 2021-12-03 DIAGNOSIS — R262 Difficulty in walking, not elsewhere classified: Secondary | ICD-10-CM | POA: Diagnosis not present

## 2021-12-07 DIAGNOSIS — Z9181 History of falling: Secondary | ICD-10-CM | POA: Diagnosis not present

## 2021-12-07 DIAGNOSIS — M6281 Muscle weakness (generalized): Secondary | ICD-10-CM | POA: Diagnosis not present

## 2021-12-07 DIAGNOSIS — Z723 Lack of physical exercise: Secondary | ICD-10-CM | POA: Diagnosis not present

## 2021-12-07 DIAGNOSIS — R262 Difficulty in walking, not elsewhere classified: Secondary | ICD-10-CM | POA: Diagnosis not present

## 2021-12-09 DIAGNOSIS — J301 Allergic rhinitis due to pollen: Secondary | ICD-10-CM | POA: Diagnosis not present

## 2021-12-09 DIAGNOSIS — J3089 Other allergic rhinitis: Secondary | ICD-10-CM | POA: Diagnosis not present

## 2021-12-10 DIAGNOSIS — Z723 Lack of physical exercise: Secondary | ICD-10-CM | POA: Diagnosis not present

## 2021-12-10 DIAGNOSIS — M6281 Muscle weakness (generalized): Secondary | ICD-10-CM | POA: Diagnosis not present

## 2021-12-10 DIAGNOSIS — R262 Difficulty in walking, not elsewhere classified: Secondary | ICD-10-CM | POA: Diagnosis not present

## 2021-12-10 DIAGNOSIS — Z9181 History of falling: Secondary | ICD-10-CM | POA: Diagnosis not present

## 2021-12-14 DIAGNOSIS — R262 Difficulty in walking, not elsewhere classified: Secondary | ICD-10-CM | POA: Diagnosis not present

## 2021-12-14 DIAGNOSIS — Z723 Lack of physical exercise: Secondary | ICD-10-CM | POA: Diagnosis not present

## 2021-12-14 DIAGNOSIS — Z9181 History of falling: Secondary | ICD-10-CM | POA: Diagnosis not present

## 2021-12-14 DIAGNOSIS — M6281 Muscle weakness (generalized): Secondary | ICD-10-CM | POA: Diagnosis not present

## 2021-12-16 DIAGNOSIS — J3089 Other allergic rhinitis: Secondary | ICD-10-CM | POA: Diagnosis not present

## 2021-12-16 DIAGNOSIS — J301 Allergic rhinitis due to pollen: Secondary | ICD-10-CM | POA: Diagnosis not present

## 2021-12-17 DIAGNOSIS — R3915 Urgency of urination: Secondary | ICD-10-CM | POA: Diagnosis not present

## 2021-12-18 ENCOUNTER — Telehealth: Payer: Self-pay

## 2021-12-18 DIAGNOSIS — R262 Difficulty in walking, not elsewhere classified: Secondary | ICD-10-CM | POA: Diagnosis not present

## 2021-12-18 DIAGNOSIS — Z9181 History of falling: Secondary | ICD-10-CM | POA: Diagnosis not present

## 2021-12-18 DIAGNOSIS — Z723 Lack of physical exercise: Secondary | ICD-10-CM | POA: Diagnosis not present

## 2021-12-18 DIAGNOSIS — M6281 Muscle weakness (generalized): Secondary | ICD-10-CM | POA: Diagnosis not present

## 2021-12-18 NOTE — Telephone Encounter (Signed)
PT plan of care signed and faxed back to [facility] at [phone]. Order sent for scan.

## 2021-12-21 DIAGNOSIS — Z723 Lack of physical exercise: Secondary | ICD-10-CM | POA: Diagnosis not present

## 2021-12-21 DIAGNOSIS — M6281 Muscle weakness (generalized): Secondary | ICD-10-CM | POA: Diagnosis not present

## 2021-12-21 DIAGNOSIS — R262 Difficulty in walking, not elsewhere classified: Secondary | ICD-10-CM | POA: Diagnosis not present

## 2021-12-21 DIAGNOSIS — Z9181 History of falling: Secondary | ICD-10-CM | POA: Diagnosis not present

## 2021-12-25 ENCOUNTER — Ambulatory Visit (INDEPENDENT_AMBULATORY_CARE_PROVIDER_SITE_OTHER): Payer: Medicare Other | Admitting: Internal Medicine

## 2021-12-25 VITALS — BP 145/62 | HR 101 | Temp 98.4°F | Resp 16 | Ht 70.0 in | Wt 261.0 lb

## 2021-12-25 DIAGNOSIS — R269 Unspecified abnormalities of gait and mobility: Secondary | ICD-10-CM | POA: Diagnosis not present

## 2021-12-25 DIAGNOSIS — B349 Viral infection, unspecified: Secondary | ICD-10-CM | POA: Diagnosis not present

## 2021-12-25 DIAGNOSIS — J4541 Moderate persistent asthma with (acute) exacerbation: Secondary | ICD-10-CM | POA: Diagnosis not present

## 2021-12-25 DIAGNOSIS — I10 Essential (primary) hypertension: Secondary | ICD-10-CM | POA: Diagnosis not present

## 2021-12-25 DIAGNOSIS — E1142 Type 2 diabetes mellitus with diabetic polyneuropathy: Secondary | ICD-10-CM

## 2021-12-25 LAB — POC COVID19 BINAXNOW: SARS Coronavirus 2 Ag: NEGATIVE

## 2021-12-25 MED ORDER — PREDNISONE 10 MG PO TABS: ORAL_TABLET | ORAL | 0 refills | Status: DC

## 2021-12-25 NOTE — Progress Notes (Signed)
Subjective:    Patient ID: Ryan Zhang, male    DOB: [birth year], 76 y.o.   MRN: [id number]  DOS:  12/25/2021 Type of visit - description: Follow-up  In general feels well. Gait disorder: Having physical therapy, "it worked wonders". Number of falls have significantly decreased.  Developed a URI 3 days ago: Symptoms started with a sore throat then quickly moved to the chest with cough, wheezing, denies any sputum production. O2 sat at home typically around 95%. No fever chills No sinus pain or congestion Some DOE. No chest pain or brief pain with cough.  No exertional symptoms. No nausea or vomiting.  No diarrhea.  Review of Systems See above   Past Medical History:  Diagnosis Date   Allergy    allergy shots Dr. Velora Heckler   Anemia    Anxiety    Asthma    moderate persistant   Bipolar affective (HCC)    Cataract    both eyes   COPD (chronic obstructive pulmonary disease) (HCC)    Depression    Diabetes mellitus    DJD (degenerative joint disease)    Dysphagia    Gallstones    hx of, s/p cholecystectomy   GERD (gastroesophageal reflux disease)    Hepatitis A    as teenager 60's   Hollenhorst plaque    right eye   Hyperlipidemia    Hypertension    Kidney stones    hx of   Meniere's disease    Motility disorder, esophageal    Obesity    Pancreatitis    Personal history of colonic polyps 11/2004   hyperplastic.   Stroke [facility])    per MRI    Past Surgical History:  Procedure Laterality Date   CATARACT EXTRACTION Bilateral 09/2015 and 10/2015   CHOLECYSTECTOMY     COLONOSCOPY     TOTAL KNEE ARTHROPLASTY Bilateral    both knees    Current Outpatient Medications  Medication Instructions   acetaminophen (TYLENOL) 650 mg, Oral, Every 6 hours PRN   albuterol (PROVENTIL) 2.5 mg, Nebulization, Every 6 hours PRN   albuterol (VENTOLIN HFA) 108 (90 Base) MCG/ACT inhaler 2 puffs, Inhalation, Every 6 hours PRN   asenapine (SAPHRIS) 5 mg, Sublingual, Daily at  bedtime   augmented betamethasone dipropionate (DIPROLENE-AF) 0.05 % ointment Topical, 2 times daily PRN   azelastine (ASTELIN) 0.1 % nasal spray 1 spray, Each Nare, As needed, To replace Flonase    budesonide-formoterol (SYMBICORT) 160-4.5 MCG/ACT inhaler 2 puffs, Inhalation, 2 times daily   buPROPion (WELLBUTRIN XL) 150 mg, Oral, Daily   clonazePAM (KLONOPIN) 0.5 mg, Oral, See admin instructions, #2 at 8am, #1 at 2pm ([name])   clopidogrel (PLAVIX) 75 MG tablet TAKE ONE TABLET BY MOUTH ONE TIME DAILY   DULoxetine (CYMBALTA) 60 mg, Oral, BH-each morning   EPIPEN 2-PAK 0.3 MG/0.3ML SOAJ injection Reported on 04/29/2016   esomeprazole (NEXIUM) 40 mg, Oral, BH-each morning   fexofenadine (ALLEGRA) 180 mg, Oral, Every morning, 8am ([place])   fluticasone (VERAMYST) 27.5 MCG/SPRAY nasal spray 2 sprays, Nasal, Daily PRN   lamoTRIgine (LAMICTAL) 50 mg, Oral, Daily at bedtime   Lancets (ONETOUCH ULTRASOFT) lancets Check blood sugars no more than twice daily   losartan (COZAAR) 25 MG tablet TAKE ONE TABLET BY MOUTH DAILY IN THE EVENING   montelukast (SINGULAIR) 10 mg, Oral, Daily at bedtime, [name]    Multiple Vitamin (MULTIVITAMIN) tablet 1 tablet, Oral, Daily   nystatin ointment (MYCOSTATIN) 1 application, Topical, 2 times daily  PRN   ONETOUCH VERIO test strip use to check blood sugar no more than twice daily   pioglitazone-metformin (ACTOPLUS MET) 15-850 MG tablet TAKE ONE TABLET BY MOUTH TWICE DAILY with a meal   simvastatin (ZOCOR) 40 MG tablet TAKE ONE TABLET BY MOUTH DAILY AT BEDTIME   solifenacin (VESICARE) 10 mg, Oral, Daily   tamsulosin (FLOMAX) 0.4 mg, Oral, Daily after supper   traZODone (DESYREL) 50 mg, Oral, At bedtime PRN, [name]       Objective:   Physical Exam BP (!) 145/62 (BP Location: Right Arm, Patient Position: Sitting, Cuff Size: Normal)    Pulse (!) 101    Temp 98.4 F (36.9 C) (Oral)    Resp 16    Ht 5' 10"  (1.778 m)    Wt 261 lb (118.4 kg)    SpO2 92%    BMI  37.45 kg/m  General:   Well developed, NAD, BMI noted. HEENT:  Normocephalic . Face symmetric, atraumatic. TMs: Obscured by wax.  Throat symmetric, not red.  Moist membranes. Lungs:  Wheezing throughout, no crackles.  No distress. Normal respiratory effort, no intercostal retractions, no accessory muscle use. Heart: RRR,  no murmur.  Lower extremities: no pretibial edema bilaterally  Skin: Not pale. Not jaundice Neurologic:  alert & oriented X3.  Speech normal, gait appropriate for age and unassisted Psych--  Cognition and judgment appear intact.  Cooperative with normal attention span and concentration.  Behavior appropriate. No anxious or depressed appearing.      Assessment     Assessment DM HTN Hyperlipidemia Morbid obesity (BMI 39 plus DM) Bipolar, Depression ------ see psychiatry, Dr Casimiro Needle DJD-- hydrocodone rarely rx by [place] ortho Asthma-Allergies ------------ Dr [name]  GERD, h/o dysphagia (chronic, neurogenic-transfer dysphagia? See GI note 12-2011) Neuro: --? stroke : saw neuro 2013 d/t B transient visual loss, ASA changed to plavix.  --See CTA, MRIs of head-neck report  --Saw neuro 11-2013: fall, syncope,parkinsonism d/t sapharis? CV: Enlarged ascending Ao  --per CT 12-2015,CT chest 08-2016 stable.    --MRI chest 10/2017 stable, no further routine x-rays recommended --CT chest 07/2018: Atherosclerotic changes in the thoracic aorta.  Nl LE arterial dopplers 2015 Carotid artery disease: Per ultrasound 2011, last [place] 2018: <50% B, rx medical treatment, recheck 08/2019 < 1-39% B, stable 2 years  CT chest  ---RML  48m: 07-2014, CT 12-2015, CT 08-2016, MRI chest 12,2018, CT chest 07/2018:Stable  HOH: L deaf L, R  hearing aid H/o Mnire's disease  Iron deficiency anemia: Noted 01/2018, + Hemoccult, 09-2018: Colonoscopy and EGD were completely normal  COVID-19 infection 01-2021  PLAN: DM: On Actos plus met, check A1c. HTN: BP satisfactory today, continue losartan.   Check a BMP History of anemia: Check a CBC. Asthma exacerbation: Symptoms a started 3 days ago, tested negative for COVID at home. COVID test today here at the office: Negative Plan: Continue Symbicort, continue nebulizations every 3-6 hours.  Mucinex DM.  Round of prednisone.  Call if not gradually better.  Watch CBGs.  See AVS. Gait disorder: Since the last visit, he is doing PT, he thinks he has improved significantly with less frequent falls.  Recommend to discuss PT if he would benefit from further visits otherwise we can stop.  Encouraged to be careful to prevent falls. Preventive care: Up-to-date on COVID and flu vaccines.   This visit occurred during the SARS-CoV-2 public health emergency.  Safety protocols were in place, including screening questions prior to the visit, additional usage of staff PPE,  and extensive cleaning of exam room while observing appropriate contact time as indicated for disinfecting solutions.

## 2021-12-25 NOTE — Patient Instructions (Addendum)
For the asthma exacerbation:  Continue Symbicort Continue nebulizations every 3-6 hours Take prednisone for few days. Mucinex DM over-the-counter until better Take prednisone for few days, this may raise your blood sugar, call if sugars are consistently more than 200 Continue checking your oxygen, if it is dropping consistently below the 90s, let us know.     GO TO THE FRONT DESK, PLEASE SCHEDULE YOUR APPOINTMENTS Come back for physical exam in 3 months

## 2021-12-26 ENCOUNTER — Encounter: Payer: Self-pay | Admitting: Internal Medicine

## 2021-12-26 ENCOUNTER — Other Ambulatory Visit: Payer: Self-pay | Admitting: Nurse Practitioner

## 2021-12-26 DIAGNOSIS — J4541 Moderate persistent asthma with (acute) exacerbation: Secondary | ICD-10-CM

## 2021-12-26 LAB — HEMOGLOBIN A1C
Hgb A1c MFr Bld: 6.6 % of total Hgb — ABNORMAL HIGH (ref <5.7)
Mean Plasma Glucose: 143 mg/dL
eAG (mmol/L): 7.9 mmol/L

## 2021-12-26 LAB — BASIC METABOLIC PANEL
BUN: 22 mg/dL (ref 7–25)
CO2: 23 mmol/L (ref 20–32)
Calcium: 9.2 mg/dL (ref 8.6–10.3)
Chloride: 105 mmol/L (ref 98–110)
Creat: 1.01 mg/dL (ref 0.70–1.28)
Glucose, Bld: 182 mg/dL — ABNORMAL HIGH (ref 65–99)
Potassium: 4.8 mmol/L (ref 3.5–5.3)
Sodium: 141 mmol/L (ref 135–146)

## 2021-12-26 LAB — CBC WITH DIFFERENTIAL/PLATELET
Absolute Monocytes: 737 cells/uL (ref 200–950)
Basophils Absolute: 32 cells/uL (ref 0–200)
Basophils Relative: 0.4 %
Eosinophils Absolute: 57 cells/uL (ref 15–500)
Eosinophils Relative: 0.7 %
HCT: 33.9 % — ABNORMAL LOW (ref 38.5–50.0)
Hemoglobin: 11 g/dL — ABNORMAL LOW (ref 13.2–17.1)
Lymphs Abs: 932 cells/uL (ref 850–3900)
MCH: 26.8 pg — ABNORMAL LOW (ref 27.0–33.0)
MCHC: 32.4 g/dL (ref 32.0–36.0)
MCV: 82.5 fL (ref 80.0–100.0)
MPV: 10.4 fL (ref 7.5–12.5)
Monocytes Relative: 9.1 %
Neutro Abs: 6342 cells/uL (ref 1500–7800)
Neutrophils Relative %: 78.3 %
Platelets: 269 10*3/uL (ref 140–400)
RBC: 4.11 10*6/uL — ABNORMAL LOW (ref 4.20–5.80)
RDW: 14.6 % (ref 11.0–15.0)
Total Lymphocyte: 11.5 %
WBC: 8.1 10*3/uL (ref 3.8–10.8)

## 2021-12-27 ENCOUNTER — Encounter: Payer: Self-pay | Admitting: Internal Medicine

## 2021-12-27 NOTE — Assessment & Plan Note (Signed)
DM: On Actos plus met, check A1c. HTN: BP satisfactory today, continue losartan.  Check a BMP History of anemia: Check a CBC. Asthma exacerbation: Symptoms a started 3 days ago, tested negative for COVID at home. COVID test today here at the office: Negative Plan: Continue Symbicort, continue nebulizations every 3-6 hours.  Mucinex DM.  Round of prednisone.  Call if not gradually better.  Watch CBGs.  See AVS. Gait disorder: Since the last visit, he is doing PT, he thinks he has improved significantly with less frequent falls.  Recommend to discuss PT if he would benefit from further visits otherwise we can stop.  Encouraged to be careful to prevent falls. Preventive care: Up-to-date on COVID and flu vaccines.

## 2021-12-28 ENCOUNTER — Encounter: Payer: Self-pay | Admitting: Internal Medicine

## 2021-12-28 MED ORDER — ALBUTEROL SULFATE (2.5 MG/3ML) 0.083% IN NEBU: 2.5000 mg | INHALATION_SOLUTION | Freq: Four times a day (QID) | RESPIRATORY_TRACT | 1 refills | Status: DC | PRN

## 2021-12-28 NOTE — Telephone Encounter (Signed)
Rx filled by patient PCP Not seen in our office in over 1 year

## 2022-01-04 DIAGNOSIS — M6281 Muscle weakness (generalized): Secondary | ICD-10-CM | POA: Diagnosis not present

## 2022-01-04 DIAGNOSIS — R262 Difficulty in walking, not elsewhere classified: Secondary | ICD-10-CM | POA: Diagnosis not present

## 2022-01-04 DIAGNOSIS — Z723 Lack of physical exercise: Secondary | ICD-10-CM | POA: Diagnosis not present

## 2022-01-04 DIAGNOSIS — Z9181 History of falling: Secondary | ICD-10-CM | POA: Diagnosis not present

## 2022-01-06 DIAGNOSIS — R262 Difficulty in walking, not elsewhere classified: Secondary | ICD-10-CM | POA: Diagnosis not present

## 2022-01-06 DIAGNOSIS — Z723 Lack of physical exercise: Secondary | ICD-10-CM | POA: Diagnosis not present

## 2022-01-06 DIAGNOSIS — Z9181 History of falling: Secondary | ICD-10-CM | POA: Diagnosis not present

## 2022-01-06 DIAGNOSIS — M6281 Muscle weakness (generalized): Secondary | ICD-10-CM | POA: Diagnosis not present

## 2022-01-07 DIAGNOSIS — J301 Allergic rhinitis due to pollen: Secondary | ICD-10-CM | POA: Diagnosis not present

## 2022-01-07 DIAGNOSIS — J3089 Other allergic rhinitis: Secondary | ICD-10-CM | POA: Diagnosis not present

## 2022-01-11 DIAGNOSIS — Z9181 History of falling: Secondary | ICD-10-CM | POA: Diagnosis not present

## 2022-01-11 DIAGNOSIS — M6281 Muscle weakness (generalized): Secondary | ICD-10-CM | POA: Diagnosis not present

## 2022-01-11 DIAGNOSIS — Z723 Lack of physical exercise: Secondary | ICD-10-CM | POA: Diagnosis not present

## 2022-01-11 DIAGNOSIS — R262 Difficulty in walking, not elsewhere classified: Secondary | ICD-10-CM | POA: Diagnosis not present

## 2022-01-13 DIAGNOSIS — J3089 Other allergic rhinitis: Secondary | ICD-10-CM | POA: Diagnosis not present

## 2022-01-13 DIAGNOSIS — J301 Allergic rhinitis due to pollen: Secondary | ICD-10-CM | POA: Diagnosis not present

## 2022-01-14 ENCOUNTER — Telehealth: Payer: Self-pay

## 2022-01-14 DIAGNOSIS — R262 Difficulty in walking, not elsewhere classified: Secondary | ICD-10-CM | POA: Diagnosis not present

## 2022-01-14 DIAGNOSIS — M6281 Muscle weakness (generalized): Secondary | ICD-10-CM | POA: Diagnosis not present

## 2022-01-14 DIAGNOSIS — Z723 Lack of physical exercise: Secondary | ICD-10-CM | POA: Diagnosis not present

## 2022-01-14 DIAGNOSIS — Z9181 History of falling: Secondary | ICD-10-CM | POA: Diagnosis not present

## 2022-01-14 NOTE — Telephone Encounter (Signed)
Plan of care signed and faxed back to [facility] at [phone]. Form sent for scanning.  

## 2022-01-19 DIAGNOSIS — Z723 Lack of physical exercise: Secondary | ICD-10-CM | POA: Diagnosis not present

## 2022-01-19 DIAGNOSIS — Z9181 History of falling: Secondary | ICD-10-CM | POA: Diagnosis not present

## 2022-01-19 DIAGNOSIS — M6281 Muscle weakness (generalized): Secondary | ICD-10-CM | POA: Diagnosis not present

## 2022-01-19 DIAGNOSIS — R262 Difficulty in walking, not elsewhere classified: Secondary | ICD-10-CM | POA: Diagnosis not present

## 2022-01-20 DIAGNOSIS — J3089 Other allergic rhinitis: Secondary | ICD-10-CM | POA: Diagnosis not present

## 2022-01-20 DIAGNOSIS — J301 Allergic rhinitis due to pollen: Secondary | ICD-10-CM | POA: Diagnosis not present

## 2022-01-21 DIAGNOSIS — R262 Difficulty in walking, not elsewhere classified: Secondary | ICD-10-CM | POA: Diagnosis not present

## 2022-01-21 DIAGNOSIS — Z9181 History of falling: Secondary | ICD-10-CM | POA: Diagnosis not present

## 2022-01-21 DIAGNOSIS — M6281 Muscle weakness (generalized): Secondary | ICD-10-CM | POA: Diagnosis not present

## 2022-01-21 DIAGNOSIS — Z723 Lack of physical exercise: Secondary | ICD-10-CM | POA: Diagnosis not present

## 2022-01-26 DIAGNOSIS — M6281 Muscle weakness (generalized): Secondary | ICD-10-CM | POA: Diagnosis not present

## 2022-01-26 DIAGNOSIS — Z9181 History of falling: Secondary | ICD-10-CM | POA: Diagnosis not present

## 2022-01-26 DIAGNOSIS — R262 Difficulty in walking, not elsewhere classified: Secondary | ICD-10-CM | POA: Diagnosis not present

## 2022-01-26 DIAGNOSIS — Z723 Lack of physical exercise: Secondary | ICD-10-CM | POA: Diagnosis not present

## 2022-01-27 DIAGNOSIS — J3089 Other allergic rhinitis: Secondary | ICD-10-CM | POA: Diagnosis not present

## 2022-01-27 DIAGNOSIS — J301 Allergic rhinitis due to pollen: Secondary | ICD-10-CM | POA: Diagnosis not present

## 2022-01-28 DIAGNOSIS — R262 Difficulty in walking, not elsewhere classified: Secondary | ICD-10-CM | POA: Diagnosis not present

## 2022-01-28 DIAGNOSIS — M6281 Muscle weakness (generalized): Secondary | ICD-10-CM | POA: Diagnosis not present

## 2022-01-28 DIAGNOSIS — Z9181 History of falling: Secondary | ICD-10-CM | POA: Diagnosis not present

## 2022-01-28 DIAGNOSIS — Z723 Lack of physical exercise: Secondary | ICD-10-CM | POA: Diagnosis not present

## 2022-02-01 DIAGNOSIS — M6281 Muscle weakness (generalized): Secondary | ICD-10-CM | POA: Diagnosis not present

## 2022-02-01 DIAGNOSIS — Z9181 History of falling: Secondary | ICD-10-CM | POA: Diagnosis not present

## 2022-02-01 DIAGNOSIS — R262 Difficulty in walking, not elsewhere classified: Secondary | ICD-10-CM | POA: Diagnosis not present

## 2022-02-01 DIAGNOSIS — Z723 Lack of physical exercise: Secondary | ICD-10-CM | POA: Diagnosis not present

## 2022-02-03 DIAGNOSIS — J301 Allergic rhinitis due to pollen: Secondary | ICD-10-CM | POA: Diagnosis not present

## 2022-02-03 DIAGNOSIS — J3089 Other allergic rhinitis: Secondary | ICD-10-CM | POA: Diagnosis not present

## 2022-02-04 DIAGNOSIS — M6281 Muscle weakness (generalized): Secondary | ICD-10-CM | POA: Diagnosis not present

## 2022-02-04 DIAGNOSIS — R262 Difficulty in walking, not elsewhere classified: Secondary | ICD-10-CM | POA: Diagnosis not present

## 2022-02-04 DIAGNOSIS — Z723 Lack of physical exercise: Secondary | ICD-10-CM | POA: Diagnosis not present

## 2022-02-04 DIAGNOSIS — Z9181 History of falling: Secondary | ICD-10-CM | POA: Diagnosis not present

## 2022-02-05 ENCOUNTER — Telehealth: Payer: Self-pay

## 2022-02-05 NOTE — Telephone Encounter (Signed)
Plan of care signed and faxed back to [facility] at [phone]. Form sent for scanning.  

## 2022-02-06 ENCOUNTER — Other Ambulatory Visit: Payer: Self-pay | Admitting: Internal Medicine

## 2022-02-10 DIAGNOSIS — J301 Allergic rhinitis due to pollen: Secondary | ICD-10-CM | POA: Diagnosis not present

## 2022-02-10 DIAGNOSIS — J3089 Other allergic rhinitis: Secondary | ICD-10-CM | POA: Diagnosis not present

## 2022-02-10 DIAGNOSIS — J3081 Allergic rhinitis due to animal (cat) (dog) hair and dander: Secondary | ICD-10-CM | POA: Diagnosis not present

## 2022-02-11 DIAGNOSIS — R262 Difficulty in walking, not elsewhere classified: Secondary | ICD-10-CM | POA: Diagnosis not present

## 2022-02-11 DIAGNOSIS — Z723 Lack of physical exercise: Secondary | ICD-10-CM | POA: Diagnosis not present

## 2022-02-11 DIAGNOSIS — M6281 Muscle weakness (generalized): Secondary | ICD-10-CM | POA: Diagnosis not present

## 2022-02-11 DIAGNOSIS — Z9181 History of falling: Secondary | ICD-10-CM | POA: Diagnosis not present

## 2022-02-16 DIAGNOSIS — M6281 Muscle weakness (generalized): Secondary | ICD-10-CM | POA: Diagnosis not present

## 2022-02-16 DIAGNOSIS — Z9181 History of falling: Secondary | ICD-10-CM | POA: Diagnosis not present

## 2022-02-16 DIAGNOSIS — R262 Difficulty in walking, not elsewhere classified: Secondary | ICD-10-CM | POA: Diagnosis not present

## 2022-02-16 DIAGNOSIS — Z723 Lack of physical exercise: Secondary | ICD-10-CM | POA: Diagnosis not present

## 2022-02-17 DIAGNOSIS — J301 Allergic rhinitis due to pollen: Secondary | ICD-10-CM | POA: Diagnosis not present

## 2022-02-17 DIAGNOSIS — J3089 Other allergic rhinitis: Secondary | ICD-10-CM | POA: Diagnosis not present

## 2022-02-17 DIAGNOSIS — H905 Unspecified sensorineural hearing loss: Secondary | ICD-10-CM | POA: Diagnosis not present

## 2022-02-18 DIAGNOSIS — Z723 Lack of physical exercise: Secondary | ICD-10-CM | POA: Diagnosis not present

## 2022-02-18 DIAGNOSIS — Z9181 History of falling: Secondary | ICD-10-CM | POA: Diagnosis not present

## 2022-02-18 DIAGNOSIS — M6281 Muscle weakness (generalized): Secondary | ICD-10-CM | POA: Diagnosis not present

## 2022-02-18 DIAGNOSIS — R262 Difficulty in walking, not elsewhere classified: Secondary | ICD-10-CM | POA: Diagnosis not present

## 2022-02-22 ENCOUNTER — Other Ambulatory Visit: Payer: Self-pay | Admitting: Internal Medicine

## 2022-02-24 DIAGNOSIS — J3081 Allergic rhinitis due to animal (cat) (dog) hair and dander: Secondary | ICD-10-CM | POA: Diagnosis not present

## 2022-02-24 DIAGNOSIS — R262 Difficulty in walking, not elsewhere classified: Secondary | ICD-10-CM | POA: Diagnosis not present

## 2022-02-24 DIAGNOSIS — M6281 Muscle weakness (generalized): Secondary | ICD-10-CM | POA: Diagnosis not present

## 2022-02-24 DIAGNOSIS — Z723 Lack of physical exercise: Secondary | ICD-10-CM | POA: Diagnosis not present

## 2022-02-24 DIAGNOSIS — Z9181 History of falling: Secondary | ICD-10-CM | POA: Diagnosis not present

## 2022-02-24 DIAGNOSIS — J3089 Other allergic rhinitis: Secondary | ICD-10-CM | POA: Diagnosis not present

## 2022-02-24 DIAGNOSIS — J301 Allergic rhinitis due to pollen: Secondary | ICD-10-CM | POA: Diagnosis not present

## 2022-02-26 DIAGNOSIS — R262 Difficulty in walking, not elsewhere classified: Secondary | ICD-10-CM | POA: Diagnosis not present

## 2022-02-26 DIAGNOSIS — Z723 Lack of physical exercise: Secondary | ICD-10-CM | POA: Diagnosis not present

## 2022-02-26 DIAGNOSIS — Z9181 History of falling: Secondary | ICD-10-CM | POA: Diagnosis not present

## 2022-02-26 DIAGNOSIS — M6281 Muscle weakness (generalized): Secondary | ICD-10-CM | POA: Diagnosis not present

## 2022-03-03 DIAGNOSIS — J301 Allergic rhinitis due to pollen: Secondary | ICD-10-CM | POA: Diagnosis not present

## 2022-03-03 DIAGNOSIS — J3089 Other allergic rhinitis: Secondary | ICD-10-CM | POA: Diagnosis not present

## 2022-03-04 DIAGNOSIS — Z9181 History of falling: Secondary | ICD-10-CM | POA: Diagnosis not present

## 2022-03-04 DIAGNOSIS — Z723 Lack of physical exercise: Secondary | ICD-10-CM | POA: Diagnosis not present

## 2022-03-04 DIAGNOSIS — R262 Difficulty in walking, not elsewhere classified: Secondary | ICD-10-CM | POA: Diagnosis not present

## 2022-03-04 DIAGNOSIS — M6281 Muscle weakness (generalized): Secondary | ICD-10-CM | POA: Diagnosis not present

## 2022-03-08 ENCOUNTER — Telehealth: Payer: Self-pay

## 2022-03-08 DIAGNOSIS — Z9181 History of falling: Secondary | ICD-10-CM | POA: Diagnosis not present

## 2022-03-08 DIAGNOSIS — Z723 Lack of physical exercise: Secondary | ICD-10-CM | POA: Diagnosis not present

## 2022-03-08 DIAGNOSIS — R262 Difficulty in walking, not elsewhere classified: Secondary | ICD-10-CM | POA: Diagnosis not present

## 2022-03-08 DIAGNOSIS — M6281 Muscle weakness (generalized): Secondary | ICD-10-CM | POA: Diagnosis not present

## 2022-03-08 NOTE — Telephone Encounter (Signed)
Plan of care signed and faxed back to [facility] at [phone]. Form sent for scanning.  

## 2022-03-10 DIAGNOSIS — J301 Allergic rhinitis due to pollen: Secondary | ICD-10-CM | POA: Diagnosis not present

## 2022-03-10 DIAGNOSIS — J3089 Other allergic rhinitis: Secondary | ICD-10-CM | POA: Diagnosis not present

## 2022-03-10 DIAGNOSIS — J3081 Allergic rhinitis due to animal (cat) (dog) hair and dander: Secondary | ICD-10-CM | POA: Diagnosis not present

## 2022-03-12 DIAGNOSIS — R262 Difficulty in walking, not elsewhere classified: Secondary | ICD-10-CM | POA: Diagnosis not present

## 2022-03-12 DIAGNOSIS — M6281 Muscle weakness (generalized): Secondary | ICD-10-CM | POA: Diagnosis not present

## 2022-03-12 DIAGNOSIS — Z723 Lack of physical exercise: Secondary | ICD-10-CM | POA: Diagnosis not present

## 2022-03-12 DIAGNOSIS — Z9181 History of falling: Secondary | ICD-10-CM | POA: Diagnosis not present

## 2022-03-15 ENCOUNTER — Ambulatory Visit (INDEPENDENT_AMBULATORY_CARE_PROVIDER_SITE_OTHER): Payer: Medicare Other | Admitting: Internal Medicine

## 2022-03-15 ENCOUNTER — Encounter: Payer: Self-pay | Admitting: Internal Medicine

## 2022-03-15 VITALS — BP 134/82 | HR 80 | Temp 98.0°F | Resp 18 | Ht 70.0 in | Wt 256.0 lb

## 2022-03-15 DIAGNOSIS — Z0001 Encounter for general adult medical examination with abnormal findings: Secondary | ICD-10-CM

## 2022-03-15 DIAGNOSIS — E1169 Type 2 diabetes mellitus with other specified complication: Secondary | ICD-10-CM | POA: Diagnosis not present

## 2022-03-15 DIAGNOSIS — E785 Hyperlipidemia, unspecified: Secondary | ICD-10-CM | POA: Diagnosis not present

## 2022-03-15 DIAGNOSIS — R4189 Other symptoms and signs involving cognitive functions and awareness: Secondary | ICD-10-CM

## 2022-03-15 DIAGNOSIS — Z23 Encounter for immunization: Secondary | ICD-10-CM

## 2022-03-15 DIAGNOSIS — Z8673 Personal history of transient ischemic attack (TIA), and cerebral infarction without residual deficits: Secondary | ICD-10-CM

## 2022-03-15 DIAGNOSIS — I779 Disorder of arteries and arterioles, unspecified: Secondary | ICD-10-CM | POA: Diagnosis not present

## 2022-03-15 DIAGNOSIS — E1142 Type 2 diabetes mellitus with diabetic polyneuropathy: Secondary | ICD-10-CM | POA: Diagnosis not present

## 2022-03-15 DIAGNOSIS — Z0000 Encounter for general adult medical examination without abnormal findings: Secondary | ICD-10-CM

## 2022-03-15 LAB — LIPID PANEL
Cholesterol: 124 mg/dL (ref 0–200)
HDL: 47.2 mg/dL (ref >39.00)
LDL Cholesterol: 55 mg/dL (ref 0–99)
NonHDL: 76.43
Total CHOL/HDL Ratio: 3
Triglycerides: 107 mg/dL (ref 0.0–149.0)
VLDL: 21.4 mg/dL (ref 0.0–40.0)

## 2022-03-15 LAB — HEMOGLOBIN A1C: Hgb A1c MFr Bld: 6.8 % — ABNORMAL HIGH (ref 4.6–6.5)

## 2022-03-15 LAB — ALT: ALT: 10 U/L (ref 0–53)

## 2022-03-15 LAB — AST: AST: 14 U/L (ref 0–37)

## 2022-03-15 NOTE — Assessment & Plan Note (Signed)
-  Td 2018 ?- pnm shot 2015; prevnar 2016; PNM 20: Today 03/15/2022 ?- zostavax 2013; s/p shingrex x 2 per pt ?-COVID VAX : utd. ?-CCS: Normal  Cscope 2011; cscope 09/2018: neg, no polyp, done for anemia, next per GI ?-Prostate ca screening:  Sees urology for LUTS ?- Patient education: Diet-exercise - feet care  ?-lab: AST, ALT, FLP, A1c  ?-ACP information on file  ?  ?

## 2022-03-15 NOTE — Assessment & Plan Note (Signed)
Here for CPX ?DM: Continue Actos Plusmet.  Check A1c. ?Neuropathy: Reports mild numbness at the tip of the toes, see physical exam, feet care discussed ?High cholesterol: On simvastatin, checking labs ?Bipolar, depression, cognitive issues. ?Having cognitive issues since 2007, already discussed with psychiatry, will have a neuropsychiatry evaluation. ?Asthma allergies: Currently well controlled under the care of his allergist. ?Carotid artery disease: Last [place] 09-2021, less than 39%, continue controlling CV RF. ?Stroke ? 2013, aspirin was switched to Plavix.  No apparent complications. ?RTC 4 to 6  m ?

## 2022-03-15 NOTE — Patient Instructions (Addendum)
Please schedule a Medicare Wellness visit after 04/29/22. ? ?Check the  blood pressure regularly ?BP GOAL is between 110/65 and  135/85. ?If it is consistently higher or lower, let me know ?  ? ?GO TO THE LAB : Get the blood work   ? ? ?GO TO THE FRONT DESK, PLEASE SCHEDULE YOUR APPOINTMENTS ?Come back for a checkup in 4 to 6 months ? ?Diabetes Mellitus and Foot Care ?Foot care is an important part of your health, especially when you have diabetes. Diabetes may cause you to have problems because of poor blood flow (circulation) to your feet and legs, which can cause your skin to: ?Become thinner and drier. ?Break more easily. ?Heal more slowly. ?Peel and crack. ?You may also have nerve damage (neuropathy) in your legs and feet, causing decreased feeling in them. This means that you may not notice minor injuries to your feet that could lead to more serious problems. Noticing and addressing any potential problems early is the best way to prevent future foot problems. ?How to care for your feet ?Foot hygiene ? ?Wash your feet daily with warm water and mild soap. Do not use hot water. Then, pat your feet and the areas between your toes until they are completely dry. Do not soak your feet as this can dry your skin. ?Trim your toenails straight across. Do not dig under them or around the cuticle. File the edges of your nails with an emery board or nail file. ?Apply a moisturizing lotion or petroleum jelly to the skin on your feet and to dry, brittle toenails. Use lotion that does not contain alcohol and is unscented. Do not apply lotion between your toes. ?Shoes and socks ?Wear clean socks or stockings every day. Make sure they are not too tight. Do not wear knee-high stockings since they may decrease blood flow to your legs. ?Wear shoes that fit properly and have enough cushioning. Always look in your shoes before you put them on to be sure there are no objects inside. ?To break in new shoes, wear them for just a few  hours a day. This prevents injuries on your feet. ?Wounds, scrapes, corns, and calluses ? ?Check your feet daily for blisters, cuts, bruises, sores, and redness. If you cannot see the bottom of your feet, use a mirror or ask someone for help. ?Do not cut corns or calluses or try to remove them with medicine. ?If you find a minor scrape, cut, or break in the skin on your feet, keep it and the skin around it clean and dry. You may clean these areas with mild soap and water. Do not clean the area with peroxide, alcohol, or iodine. ?If you have a wound, scrape, corn, or callus on your foot, look at it several times a day to make sure it is healing and not infected. Check for: ?Redness, swelling, or pain. ?Fluid or blood. ?Warmth. ?Pus or a bad smell. ?General tips ?Do not cross your legs. This may decrease blood flow to your feet. ?Do not use heating pads or hot water bottles on your feet. They may burn your skin. If you have lost feeling in your feet or legs, you may not know this is happening until it is too late. ?Protect your feet from hot and cold by wearing shoes, such as at the beach or on hot pavement. ?Schedule a complete foot exam at least once a year (annually) or more often if you have foot problems. Report any cuts, sores, or  bruises to your health care provider immediately. ?Where to find more information ?American Diabetes Association: www.diabetes.org ?Association of Diabetes Care & Education Specialists: www.diabeteseducator.org ?Contact a health care provider if: ?You have a medical condition that increases your risk of infection and you have any cuts, sores, or bruises on your feet. ?You have an injury that is not healing. ?You have redness on your legs or feet. ?You feel burning or tingling in your legs or feet. ?You have pain or cramps in your legs and feet. ?Your legs or feet are numb. ?Your feet always feel cold. ?You have pain around any toenails. ?Get help right away if: ?You have a wound,  scrape, corn, or callus on your foot and: ?You have pain, swelling, or redness that gets worse. ?You have fluid or blood coming from the wound, scrape, corn, or callus. ?Your wound, scrape, corn, or callus feels warm to the touch. ?You have pus or a bad smell coming from the wound, scrape, corn, or callus. ?You have a fever. ?You have a red line going up your leg. ?Summary ?Check your feet every day for blisters, cuts, bruises, sores, and redness. ?Apply a moisturizing lotion or petroleum jelly to the skin on your feet and to dry, brittle toenails. ?Wear shoes that fit properly and have enough cushioning. ?If you have foot problems, report any cuts, sores, or bruises to your health care provider immediately. ?Schedule a complete foot exam at least once a year (annually) or more often if you have foot problems. ?This information is not intended to replace advice given to you by your health care provider. Make sure you discuss any questions you have with your health care provider. ?Document Revised: 05/22/2020 Document Reviewed: 05/22/2020 ?Elsevier Patient Education ? 2023 [employer]. ? ?

## 2022-03-15 NOTE — Progress Notes (Signed)
? ?Subjective:  ? ? Patient ID: Ryan Zhang, male    DOB: 05-27-1946, 76 y.o.   MRN: [id number] ? ?DOS:  03/15/2022 ?Type of visit - description: CPX ? ?Here for CPX ?Physically he feels well. ?Still sees psychiatry, concerned about his overall cognitive state.  He has not been the same since he had ECT in 2007 but is not declining. ? ?Review of Systems ? ?Other than above, a 14 point review of systems is negative  ?  ? ?Past Medical History:  ?Diagnosis Date  ? Allergy   ? allergy shots Dr. Velora Heckler  ? Anemia   ? Anxiety   ? Asthma   ? moderate persistant  ? Bipolar affective ([place])   ? Cataract   ? both eyes  ? COPD (chronic obstructive pulmonary disease) ([place])   ? Depression   ? Diabetes mellitus   ? DJD (degenerative joint disease)   ? Dysphagia   ? Gallstones   ? hx of, s/p cholecystectomy  ? GERD (gastroesophageal reflux disease)   ? Hepatitis A   ? as teenager 64's  ? Hollenhorst plaque   ? right eye  ? Hyperlipidemia   ? Hypertension   ? Kidney stones   ? hx of  ? Meniere's disease   ? Motility disorder, esophageal   ? Obesity   ? Pancreatitis   ? Personal history of colonic polyps 11/2004  ? hyperplastic.  ? Stroke [facility])   ? per MRI  ? ? ?Past Surgical History:  ?Procedure Laterality Date  ? CATARACT EXTRACTION Bilateral 09/2015 and 10/2015  ? CHOLECYSTECTOMY    ? COLONOSCOPY    ? TOTAL KNEE ARTHROPLASTY Bilateral   ? both knees  ? ?Social History  ? ?Socioeconomic History  ? Marital status: Married  ?  Spouse name: Not on file  ? Number of children: 1  ? Years of education: 18  ? Highest education level: Bachelor's degree (e.g., BA, AB, BS)  ?Occupational History  ? Occupation: disabled (due to s/e of ECT)  ?  Employer: DISABLED  ?  Comment: laboratory computing/programming  ?Tobacco Use  ? Smoking status: Never  ? Smokeless tobacco: Never  ?Substance and Sexual Activity  ? Alcohol use: Not Currently  ? Drug use: No  ? Sexual activity: Not Currently  ?Other Topics Concern  ? Not on file  ?Social History  Narrative  ? - Household-- pt , wife  ? - adopted  daughter (h/o substance abuse,on a methadone program, doing better )  ? - disability d/t ECT (2007) - was left w/ cognitive problems   ? ?Social Determinants of Health  ? ?Financial Resource Strain: Not on file  ?Food Insecurity: No Food Insecurity  ? Worried About Charity fundraiser in the Last Year: Never true  ? Ran Out of Food in the Last Year: Never true  ?Transportation Needs: No Transportation Needs  ? Lack of Transportation (Medical): No  ? Lack of Transportation (Non-Medical): No  ?Physical Activity: Insufficiently Active  ? Days of Exercise per Week: 7 days  ? Minutes of Exercise per Session: 20 min  ?Stress: No Stress Concern Present  ? Feeling of Stress : Not at all  ?Social Connections: Moderately Integrated  ? Frequency of Communication with Friends and Family: More than three times a week  ? Frequency of Social Gatherings with Friends and Family: Once a week  ? Attends Religious Services: More than 4 times per year  ? Active Member of Clubs or Organizations:  No  ? Attends Archivist Meetings: Never  ? Marital Status: Married  ?Intimate Partner Violence: Not At Risk  ? Fear of Current or Ex-Partner: No  ? Emotionally Abused: No  ? Physically Abused: No  ? Sexually Abused: No  ? ? ? ?Current Outpatient Medications  ?Medication Instructions  ? acetaminophen (TYLENOL) 650 mg, Oral, Every 6 hours PRN  ? albuterol (PROVENTIL) 2.5 mg, Nebulization, Every 6 hours PRN  ? albuterol (VENTOLIN HFA) 108 (90 Base) MCG/ACT inhaler 2 puffs, Inhalation, Every 6 hours PRN  ? asenapine (SAPHRIS) 5 mg, Sublingual, Daily at bedtime  ? azelastine (ASTELIN) 0.1 % nasal spray 1 spray, Each Nare, As needed, To replace Flonase   ? budesonide-formoterol (SYMBICORT) 160-4.5 MCG/ACT inhaler 2 puffs, Inhalation, 2 times daily  ? buPROPion (WELLBUTRIN XL) 150 mg, Oral, Daily  ? clonazePAM (KLONOPIN) 0.5 mg, Oral, See admin instructions, #2 at 8am, #1 at 2pm [facility])  ?  clopidogrel (PLAVIX) 75 MG tablet TAKE ONE TABLET BY MOUTH ONE TIME DAILY  ? DULoxetine (CYMBALTA) 60 mg, Oral, BH-each morning  ? EPIPEN 2-PAK 0.3 MG/0.3ML SOAJ injection Reported on 04/29/2016  ? esomeprazole (NEXIUM) 40 mg, Oral, BH-each morning  ? fexofenadine (ALLEGRA) 180 mg, Oral, Every morning, 8am [name])  ? fluticasone (VERAMYST) 27.5 MCG/SPRAY nasal spray 2 sprays, Nasal, Daily PRN  ? lamoTRIgine (LAMICTAL) 50 mg, Oral, Daily at bedtime  ? Lancets (ONETOUCH ULTRASOFT) lancets Check blood sugars no more than twice daily  ? losartan (COZAAR) 25 MG tablet TAKE ONE TABLET BY MOUTH DAILY IN THE EVENING  ? montelukast (SINGULAIR) 10 mg, Oral, Daily at bedtime, [name]   ? Multiple Vitamin (MULTIVITAMIN) tablet 1 tablet, Oral, Daily  ? nystatin ointment (MYCOSTATIN) 1 application., Topical, 2 times daily PRN  ? ONETOUCH VERIO test strip use to check blood sugar no more than twice daily  ? pioglitazone-metformin (ACTOPLUS MET) 15-850 MG tablet TAKE ONE TABLET BY MOUTH TWICE A DAY WITH MEALS  ? simvastatin (ZOCOR) 40 MG tablet TAKE ONE TABLET BY MOUTH DAILY AT BEDTIME  ? solifenacin (VESICARE) 10 mg, Oral, Daily  ? ? ?   ?Objective:  ? Physical Exam ?BP 134/82 (BP Location: Left Arm, Patient Position: Sitting, Cuff Size: Normal)   Pulse 80   Temp 98 ?F (36.7 ?C) (Oral)   Resp 18   Ht 5' 10"  (1.778 m)   Wt 256 lb (116.1 kg)   SpO2 92%   BMI 36.73 kg/m?  ?General: ?Well developed, NAD, BMI noted ?Neck: No  thyromegaly  ?HEENT:  ?Normocephalic . Face symmetric, atraumatic ?Lungs:  ?CTA B ?Normal respiratory effort, no intercostal retractions, no accessory muscle use. ?Heart: RRR,  no murmur.  ?Abdomen:  ?Not distended, soft, non-tender. No rebound or rigidity.   ?DM foot exam: Trace edema, good pedal pulses, slightly decreased sensitivity to a few toes. ?Kin: Exposed areas without rash. Not pale. Not jaundice ?Neurologic:  ?alert & oriented X3.  ?Speech normal, gait appropriate for age and  unassisted ?Strength symmetric and appropriate for age.  ?Psych: ?Cognition and judgment appear intact.  ?Cooperative with normal attention span and concentration.  ?Behavior appropriate. ?No anxious or depressed appearing. ? ?   ?Assessment   ? ?Assessment ?DM, mild neuropathy ?HTN ?Hyperlipidemia ?Morbid obesity (BMI 39 plus DM) ?Bipolar, Depression, cognitive issues after ECT 2007 (on disability) per   psychiatry, Dr Casimiro Needle ?DJD-- hydrocodone rarely rx by [facility] ortho ?Asthma-Allergies ------------ Dr [name]  ?GERD, h/o dysphagia (chronic, neurogenic-transfer dysphagia? See GI  note 12-2011) ?Neuro: ?--? stroke : saw neuro 2013 d/t B transient visual loss, ASA changed to plavix.  ?--See CTA, MRIs of head-neck report  ?--Saw neuro 11-2013: fall, syncope,parkinsonism d/t sapharis? ?CV: ?Enlarged ascending Ao  ?--per CT 12-2015,CT chest 08-2016 stable.    ?--MRI chest 10/2017 stable, no further routine x-rays recommended ?--CT chest 07/2018: Atherosclerotic changes in the thoracic aorta.  ?Nl LE arterial dopplers 2015 ?Carotid artery disease: Per [place]  2011, last [place] 2018: <50% B,   08/2019 < 1-39% B, 09-2021 < 39%, stable consider recheck in 2 years  ?CT chest  ?---RML  46m: 07-2014, CT 12-2015, CT 08-2016, MRI chest 12,2018, CT chest 07/2018:Stable  ?HOH: L deaf L, R  hearing aid ?H/o M?ni?re's disease  ?Iron deficiency anemia: Noted 01/2018, + Hemoccult, 09-2018: Colonoscopy and EGD were completely normal  ? ? ?PLAN: ?Here for CPX ?DM: Continue Actos Plusmet.  Check A1c. ?Neuropathy: Reports mild numbness at the tip of the toes, see physical exam, feet care discussed ?High cholesterol: On simvastatin, checking labs ?Bipolar, depression, cognitive issues. ?Having cognitive issues since 2007, already discussed with psychiatry, will have a neuropsychiatry evaluation. ?Asthma allergies: Currently well controlled under the care of his allergist. ?Carotid artery disease: Last [place], less than 39%, continue controlling CV  RF. ?Stroke ? 2013, aspirin was switched to Plavix.  No apparent complications. ?RTC 4 to 6  m ? ?In addition to CPX I addressed al his medical problems, pt concerned about his cognition, listening therapy provided  ?

## 2022-03-16 DIAGNOSIS — R262 Difficulty in walking, not elsewhere classified: Secondary | ICD-10-CM | POA: Diagnosis not present

## 2022-03-16 DIAGNOSIS — Z723 Lack of physical exercise: Secondary | ICD-10-CM | POA: Diagnosis not present

## 2022-03-16 DIAGNOSIS — M6281 Muscle weakness (generalized): Secondary | ICD-10-CM | POA: Diagnosis not present

## 2022-03-16 DIAGNOSIS — Z9181 History of falling: Secondary | ICD-10-CM | POA: Diagnosis not present

## 2022-03-18 ENCOUNTER — Encounter: Payer: Self-pay | Admitting: Internal Medicine

## 2022-03-19 ENCOUNTER — Other Ambulatory Visit: Payer: Self-pay | Admitting: Internal Medicine

## 2022-03-19 MED ORDER — NYSTATIN 100000 UNIT/GM EX OINT: 1.0000 "application " | TOPICAL_OINTMENT | Freq: Two times a day (BID) | CUTANEOUS | 0 refills | Status: DC | PRN

## 2022-03-23 DIAGNOSIS — Z723 Lack of physical exercise: Secondary | ICD-10-CM | POA: Diagnosis not present

## 2022-03-23 DIAGNOSIS — M6281 Muscle weakness (generalized): Secondary | ICD-10-CM | POA: Diagnosis not present

## 2022-03-23 DIAGNOSIS — Z9181 History of falling: Secondary | ICD-10-CM | POA: Diagnosis not present

## 2022-03-23 DIAGNOSIS — R262 Difficulty in walking, not elsewhere classified: Secondary | ICD-10-CM | POA: Diagnosis not present

## 2022-03-24 DIAGNOSIS — J3089 Other allergic rhinitis: Secondary | ICD-10-CM | POA: Diagnosis not present

## 2022-03-24 DIAGNOSIS — J3081 Allergic rhinitis due to animal (cat) (dog) hair and dander: Secondary | ICD-10-CM | POA: Diagnosis not present

## 2022-03-24 DIAGNOSIS — J301 Allergic rhinitis due to pollen: Secondary | ICD-10-CM | POA: Diagnosis not present

## 2022-03-25 DIAGNOSIS — R262 Difficulty in walking, not elsewhere classified: Secondary | ICD-10-CM | POA: Diagnosis not present

## 2022-03-25 DIAGNOSIS — M6281 Muscle weakness (generalized): Secondary | ICD-10-CM | POA: Diagnosis not present

## 2022-03-25 DIAGNOSIS — Z9181 History of falling: Secondary | ICD-10-CM | POA: Diagnosis not present

## 2022-03-25 DIAGNOSIS — Z723 Lack of physical exercise: Secondary | ICD-10-CM | POA: Diagnosis not present

## 2022-03-30 DIAGNOSIS — Z723 Lack of physical exercise: Secondary | ICD-10-CM | POA: Diagnosis not present

## 2022-03-30 DIAGNOSIS — R262 Difficulty in walking, not elsewhere classified: Secondary | ICD-10-CM | POA: Diagnosis not present

## 2022-03-30 DIAGNOSIS — Z9181 History of falling: Secondary | ICD-10-CM | POA: Diagnosis not present

## 2022-03-30 DIAGNOSIS — M6281 Muscle weakness (generalized): Secondary | ICD-10-CM | POA: Diagnosis not present

## 2022-03-31 ENCOUNTER — Ambulatory Visit (INDEPENDENT_AMBULATORY_CARE_PROVIDER_SITE_OTHER): Payer: Medicare Other | Admitting: Internal Medicine

## 2022-03-31 ENCOUNTER — Encounter: Payer: Self-pay | Admitting: Internal Medicine

## 2022-03-31 VITALS — BP 134/70 | HR 83 | Temp 98.0°F | Resp 16 | Ht 70.0 in | Wt 246.4 lb

## 2022-03-31 DIAGNOSIS — N476 Balanoposthitis: Secondary | ICD-10-CM | POA: Diagnosis not present

## 2022-03-31 DIAGNOSIS — J3089 Other allergic rhinitis: Secondary | ICD-10-CM | POA: Diagnosis not present

## 2022-03-31 DIAGNOSIS — J301 Allergic rhinitis due to pollen: Secondary | ICD-10-CM | POA: Diagnosis not present

## 2022-03-31 MED ORDER — KETOCONAZOLE 2 % EX CREA: 1.0000 "application " | TOPICAL_CREAM | Freq: Every day | CUTANEOUS | 0 refills | Status: DC

## 2022-03-31 MED ORDER — HYDROCORTISONE 2.5 % EX CREA: TOPICAL_CREAM | Freq: Two times a day (BID) | CUTANEOUS | 0 refills | Status: AC

## 2022-03-31 NOTE — Patient Instructions (Addendum)
Pull  the foreskin up several times a day ? ?Apply both nizoral and hydrocortisone twice daily. ? ?Keep the area clean and dry ? ? ?Come back in 3 months and let me recheck ? ?

## 2022-03-31 NOTE — Progress Notes (Signed)
Subjective:    Patient ID: Ryan Zhang, male    DOB: 02/06/1946, 76 y.o.   MRN: [id number]  DOS:  03/31/2022 Type of visit - description: acute  Patient sent a message few days ago,  has irritation at the penis, on and off for months. At his request I sent nystatin, he has been applying it for the last 10 days. The irritation itself seems to be improving however he still continue with a lot of pain whenever he pulls back the foreskin. Is hard for him to see the area however he does not think he had blisters. No penile discharge.   Review of Systems See above   Past Medical History:  Diagnosis Date   Allergy    allergy shots Dr. Velora Heckler   Anemia    Anxiety    Asthma    moderate persistant   Bipolar affective ([facility])    Cataract    both eyes   COPD (chronic obstructive pulmonary disease) ([facility])    Depression    Diabetes mellitus    DJD (degenerative joint disease)    Dysphagia    Gallstones    hx of, s/p cholecystectomy   GERD (gastroesophageal reflux disease)    Hepatitis A    as teenager 60's   Hollenhorst plaque    right eye   Hyperlipidemia    Hypertension    Kidney stones    hx of   Meniere's disease    Motility disorder, esophageal    Obesity    Pancreatitis    Personal history of colonic polyps 11/2004   hyperplastic.   Stroke [facility])    per MRI    Past Surgical History:  Procedure Laterality Date   CATARACT EXTRACTION Bilateral 09/2015 and 10/2015   CHOLECYSTECTOMY     COLONOSCOPY     TOTAL KNEE ARTHROPLASTY Bilateral    both knees    Current Outpatient Medications  Medication Instructions   acetaminophen (TYLENOL) 650 mg, Oral, Every 6 hours PRN   albuterol (PROVENTIL) 2.5 mg, Nebulization, Every 6 hours PRN   albuterol (VENTOLIN HFA) 108 (90 Base) MCG/ACT inhaler 2 puffs, Inhalation, Every 6 hours PRN   asenapine (SAPHRIS) 5 mg, Daily at bedtime   azelastine (ASTELIN) 0.1 % nasal spray 1 spray, Each Nare, As needed, To replace Flonase     budesonide-formoterol (SYMBICORT) 160-4.5 MCG/ACT inhaler 2 puffs, Inhalation, 2 times daily   buPROPion (WELLBUTRIN XL) 150 mg, Oral, Daily   clonazePAM (KLONOPIN) 0.5 mg, Oral, See admin instructions, #2 at 8am, #1 at 2pm ([name])   clopidogrel (PLAVIX) 75 MG tablet TAKE ONE TABLET BY MOUTH ONE TIME DAILY   DULoxetine (CYMBALTA) 60 mg, Oral, BH-each morning   EPIPEN 2-PAK 0.3 MG/0.3ML SOAJ injection Reported on 04/29/2016   esomeprazole (NEXIUM) 40 mg, Oral, BH-each morning   fexofenadine (ALLEGRA) 180 mg, Oral, Every morning, 8am ([place])   fluticasone (VERAMYST) 27.5 MCG/SPRAY nasal spray 2 sprays, Nasal, Daily PRN   lamoTRIgine (LAMICTAL) 50 mg, Oral, Daily at bedtime   Lancets (ONETOUCH ULTRASOFT) lancets Check blood sugars no more than twice daily   losartan (COZAAR) 25 MG tablet TAKE ONE TABLET BY MOUTH DAILY IN THE EVENING   montelukast (SINGULAIR) 10 mg, Oral, Daily at bedtime, [name]    Multiple Vitamin (MULTIVITAMIN) tablet 1 tablet, Oral, Daily   nystatin (MYCOSTATIN/NYSTOP) powder 1 application., Topical, 3 times daily   nystatin ointment (MYCOSTATIN) 1 application., Topical, 2 times daily PRN   ONETOUCH VERIO test strip use  to check blood sugar no more than twice daily   pioglitazone-metformin (ACTOPLUS MET) 15-850 MG tablet TAKE ONE TABLET BY MOUTH TWICE A DAY WITH MEALS   simvastatin (ZOCOR) 40 MG tablet TAKE ONE TABLET BY MOUTH DAILY AT BEDTIME   solifenacin (VESICARE) 10 mg, Oral, Daily       Objective:   Physical Exam BP 134/70 (BP Location: Left Arm, Patient Position: Sitting, Cuff Size: Normal)   Pulse 83   Temp 98 F (36.7 C) (Oral)   Resp 16   Ht _0  (1.778 m)   Wt 246 lb 6 oz (111.8 kg)   SpO2 95%   BMI 35.35 kg/m  General:   Well developed, NAD, BMI noted. HEENT:  Normocephalic . Face symmetric, atraumatic GU: The patient was able to pull back the foreskin, it is quite swollen. Glans: The corona of the glans is swollen and firm.  He has a  couple of ulcers by the frenulum. The rest of the skin of the glans is slightly erythematous.  No blisters. + Discomfort when he manipulated the foreskin. Spontaneous urination noted. Skin: Not pale. Not jaundice Neurologic:  alert & oriented X3.  Speech normal, gait appropriate for age and unassisted Psych--  Cognition and judgment appear intact.  Cooperative with normal attention span and concentration.  Behavior appropriate. No anxious or depressed appearing.      Assessment   Assessment DM, mild neuropathy HTN Hyperlipidemia Morbid obesity (BMI 39 plus DM) Bipolar, Depression, cognitive issues after ECT 2007 (on disability) per   psychiatry, Dr Casimiro Needle DJD-- hydrocodone rarely rx by [place] ortho Asthma-Allergies ------------ Dr [name]  GERD, h/o dysphagia (chronic, neurogenic-transfer dysphagia? See GI note 12-2011) Neuro: --? stroke : saw neuro 2013 d/t B transient visual loss, ASA changed to plavix.  --See CTA, MRIs of head-neck report  --Saw neuro 11-2013: fall, syncope,parkinsonism d/t sapharis? CV: Enlarged ascending Ao  --per CT 12-2015,CT chest 08-2016 stable.    --MRI chest 10/2017 stable, no further routine x-rays recommended --CT chest 07/2018: Atherosclerotic changes in the thoracic aorta.  Nl LE arterial dopplers 2015 Carotid artery disease: Per [place]  2011, last [place] 2018: <50% B,   08/2019 < 1-39% B, 09-2021 < 39%, stable consider recheck in 2 years  CT chest  ---RML  27m: 07-2014, CT 12-2015, CT 08-2016, MRI chest 12,2018, CT chest 07/2018:Stable  HOH: L deaf L, R  hearing aid H/o Mnire's disease  Iron deficiency anemia: Noted 01/2018, + Hemoccult, 09-2018: Colonoscopy and EGD were completely normal    PLAN: Balanoposthitis: Patient is diabetic, not on SGLT2's, presents with balanoposthitis for months. Admits that he occasionally leaks urine. Was empirically prescribed nystatin, improved to some extent. Plan: Pull back the foreskin frequently, keep the area as  clean and as dry as possible. Apply Nizoral plus hydrocortisone 2.5% twice daily Once better, consider Desitin. If no significant improvement, consider referral to urology

## 2022-04-01 DIAGNOSIS — Z723 Lack of physical exercise: Secondary | ICD-10-CM | POA: Diagnosis not present

## 2022-04-01 DIAGNOSIS — R262 Difficulty in walking, not elsewhere classified: Secondary | ICD-10-CM | POA: Diagnosis not present

## 2022-04-01 DIAGNOSIS — Z9181 History of falling: Secondary | ICD-10-CM | POA: Diagnosis not present

## 2022-04-01 DIAGNOSIS — M6281 Muscle weakness (generalized): Secondary | ICD-10-CM | POA: Diagnosis not present

## 2022-04-01 NOTE — Assessment & Plan Note (Signed)
Balanoposthitis: Patient is diabetic, not on SGLT2's, presents with balanoposthitis for months. Admits that he occasionally leaks urine. Was empirically prescribed nystatin, improved to some extent. Plan: Pull back the foreskin frequently, keep the area as clean and as dry as possible. Apply Nizoral plus hydrocortisone 2.5% twice daily Once better, consider Desitin. If no significant improvement, consider referral to urology

## 2022-04-02 DIAGNOSIS — J301 Allergic rhinitis due to pollen: Secondary | ICD-10-CM | POA: Diagnosis not present

## 2022-04-02 DIAGNOSIS — J3089 Other allergic rhinitis: Secondary | ICD-10-CM | POA: Diagnosis not present

## 2022-04-02 DIAGNOSIS — J3081 Allergic rhinitis due to animal (cat) (dog) hair and dander: Secondary | ICD-10-CM | POA: Diagnosis not present

## 2022-04-06 DIAGNOSIS — M6281 Muscle weakness (generalized): Secondary | ICD-10-CM | POA: Diagnosis not present

## 2022-04-06 DIAGNOSIS — R262 Difficulty in walking, not elsewhere classified: Secondary | ICD-10-CM | POA: Diagnosis not present

## 2022-04-06 DIAGNOSIS — Z9181 History of falling: Secondary | ICD-10-CM | POA: Diagnosis not present

## 2022-04-06 DIAGNOSIS — Z723 Lack of physical exercise: Secondary | ICD-10-CM | POA: Diagnosis not present

## 2022-04-07 DIAGNOSIS — J3081 Allergic rhinitis due to animal (cat) (dog) hair and dander: Secondary | ICD-10-CM | POA: Diagnosis not present

## 2022-04-07 DIAGNOSIS — J301 Allergic rhinitis due to pollen: Secondary | ICD-10-CM | POA: Diagnosis not present

## 2022-04-07 DIAGNOSIS — J3089 Other allergic rhinitis: Secondary | ICD-10-CM | POA: Diagnosis not present

## 2022-04-08 ENCOUNTER — Telehealth: Payer: Self-pay

## 2022-04-08 NOTE — Telephone Encounter (Signed)
Plan of care signed and faxed back to [facility] at [phone]. Form sent for scanning.  

## 2022-04-09 DIAGNOSIS — R262 Difficulty in walking, not elsewhere classified: Secondary | ICD-10-CM | POA: Diagnosis not present

## 2022-04-09 DIAGNOSIS — Z9181 History of falling: Secondary | ICD-10-CM | POA: Diagnosis not present

## 2022-04-09 DIAGNOSIS — J3081 Allergic rhinitis due to animal (cat) (dog) hair and dander: Secondary | ICD-10-CM | POA: Diagnosis not present

## 2022-04-09 DIAGNOSIS — Z723 Lack of physical exercise: Secondary | ICD-10-CM | POA: Diagnosis not present

## 2022-04-09 DIAGNOSIS — J3089 Other allergic rhinitis: Secondary | ICD-10-CM | POA: Diagnosis not present

## 2022-04-09 DIAGNOSIS — J301 Allergic rhinitis due to pollen: Secondary | ICD-10-CM | POA: Diagnosis not present

## 2022-04-09 DIAGNOSIS — M6281 Muscle weakness (generalized): Secondary | ICD-10-CM | POA: Diagnosis not present

## 2022-04-13 DIAGNOSIS — R262 Difficulty in walking, not elsewhere classified: Secondary | ICD-10-CM | POA: Diagnosis not present

## 2022-04-13 DIAGNOSIS — M6281 Muscle weakness (generalized): Secondary | ICD-10-CM | POA: Diagnosis not present

## 2022-04-13 DIAGNOSIS — Z723 Lack of physical exercise: Secondary | ICD-10-CM | POA: Diagnosis not present

## 2022-04-13 DIAGNOSIS — Z9181 History of falling: Secondary | ICD-10-CM | POA: Diagnosis not present

## 2022-04-14 DIAGNOSIS — J301 Allergic rhinitis due to pollen: Secondary | ICD-10-CM | POA: Diagnosis not present

## 2022-04-14 DIAGNOSIS — J3089 Other allergic rhinitis: Secondary | ICD-10-CM | POA: Diagnosis not present

## 2022-04-15 DIAGNOSIS — M6281 Muscle weakness (generalized): Secondary | ICD-10-CM | POA: Diagnosis not present

## 2022-04-15 DIAGNOSIS — Z9181 History of falling: Secondary | ICD-10-CM | POA: Diagnosis not present

## 2022-04-15 DIAGNOSIS — Z723 Lack of physical exercise: Secondary | ICD-10-CM | POA: Diagnosis not present

## 2022-04-15 DIAGNOSIS — R262 Difficulty in walking, not elsewhere classified: Secondary | ICD-10-CM | POA: Diagnosis not present

## 2022-04-16 DIAGNOSIS — J3089 Other allergic rhinitis: Secondary | ICD-10-CM | POA: Diagnosis not present

## 2022-04-16 DIAGNOSIS — J301 Allergic rhinitis due to pollen: Secondary | ICD-10-CM | POA: Diagnosis not present

## 2022-04-19 ENCOUNTER — Encounter: Payer: Self-pay | Admitting: Internal Medicine

## 2022-04-19 ENCOUNTER — Other Ambulatory Visit (HOSPITAL_BASED_OUTPATIENT_CLINIC_OR_DEPARTMENT_OTHER): Payer: Self-pay

## 2022-04-19 ENCOUNTER — Ambulatory Visit (INDEPENDENT_AMBULATORY_CARE_PROVIDER_SITE_OTHER): Payer: Medicare Other | Admitting: Internal Medicine

## 2022-04-19 VITALS — BP 132/74 | HR 75 | Temp 98.0°F | Resp 18 | Ht 70.0 in | Wt 249.5 lb

## 2022-04-19 DIAGNOSIS — L03011 Cellulitis of right finger: Secondary | ICD-10-CM

## 2022-04-19 DIAGNOSIS — N476 Balanoposthitis: Secondary | ICD-10-CM

## 2022-04-19 MED ORDER — BETAMETHASONE DIPROPIONATE AUG 0.05 % EX CREA
TOPICAL_CREAM | Freq: Two times a day (BID) | CUTANEOUS | 0 refills | Status: DC
  Filled 2022-04-19: qty 60, 30d supply, fill #0

## 2022-04-19 MED ORDER — CEPHALEXIN 500 MG PO CAPS
500.0000 mg | ORAL_CAPSULE | Freq: Four times a day (QID) | ORAL | 0 refills | Status: DC
  Filled 2022-04-19: qty 20, 5d supply, fill #0

## 2022-04-19 NOTE — Patient Instructions (Signed)
Take the antibiotics for 5 days  Apply the cream twice daily for 5 days  Ice as needed  Tylenol if pain  Call anytime if you are not improving or if the areas get  worse

## 2022-04-19 NOTE — Progress Notes (Unsigned)
Subjective:    Patient ID: Ryan Zhang, male    DOB: 07-12-1946, 76 y.o.   MRN: [id number]  DOS:  04/19/2022 Type of visit - description: Acute, here with his wife  Noted swelling, redness, pain @ the right fifth finger 2 days ago. The next day it "pop" and he had some discharge but is not described as purulent. Has use H2O2 and antibiotic ointment with of major help. Has a seeming arm that is slightly itchy area at the right abdomen.  No fever chills No injury that he can until Has not been exposed to poison ivy  Review of Systems See above   Past Medical History:  Diagnosis Date   Allergy    allergy shots Dr. Velora Heckler   Anemia    Anxiety    Asthma    moderate persistant   Bipolar affective ([facility])    Cataract    both eyes   COPD (chronic obstructive pulmonary disease) ([facility])    Depression    Diabetes mellitus    DJD (degenerative joint disease)    Dysphagia    Gallstones    hx of, s/p cholecystectomy   GERD (gastroesophageal reflux disease)    Hepatitis A    as teenager 60's   Hollenhorst plaque    right eye   Hyperlipidemia    Hypertension    Kidney stones    hx of   Meniere's disease    Motility disorder, esophageal    Obesity    Pancreatitis    Personal history of colonic polyps 11/2004   hyperplastic.   Stroke [facility])    per MRI    Past Surgical History:  Procedure Laterality Date   CATARACT EXTRACTION Bilateral 09/2015 and 10/2015   CHOLECYSTECTOMY     COLONOSCOPY     TOTAL KNEE ARTHROPLASTY Bilateral    both knees    Current Outpatient Medications  Medication Instructions   acetaminophen (TYLENOL) 650 mg, Oral, Every 6 hours PRN   albuterol (PROVENTIL) 2.5 mg, Nebulization, Every 6 hours PRN   albuterol (VENTOLIN HFA) 108 (90 Base) MCG/ACT inhaler 2 puffs, Inhalation, Every 6 hours PRN   azelastine (ASTELIN) 0.1 % nasal spray 1 spray, Each Nare, As needed, To replace Flonase    budesonide-formoterol (SYMBICORT) 160-4.5 MCG/ACT inhaler 2 puffs,  Inhalation, 2 times daily   buPROPion (WELLBUTRIN XL) 150 mg, Oral, Daily   clonazePAM (KLONOPIN) 0.5 mg, Oral, See admin instructions, #2 at 8am, #1 at 2pm ([name])   clopidogrel (PLAVIX) 75 MG tablet TAKE ONE TABLET BY MOUTH ONE TIME DAILY   DULoxetine (CYMBALTA) 60 mg, Oral, BH-each morning   EPIPEN 2-PAK 0.3 MG/0.3ML SOAJ injection Reported on 04/29/2016   esomeprazole (NEXIUM) 40 mg, Oral, BH-each morning   fexofenadine (ALLEGRA) 180 mg, Oral, Every morning, 8am [name])   fluticasone (VERAMYST) 27.5 MCG/SPRAY nasal spray 2 sprays, Nasal, Daily PRN   hydrocortisone 2.5 % cream Topical, 2 times daily   ketoconazole (NIZORAL) 2 % cream 1 application., Topical, Daily   lamoTRIgine (LAMICTAL) 50 mg, Oral, Daily at bedtime   Lancets (ONETOUCH ULTRASOFT) lancets Check blood sugars no more than twice daily   losartan (COZAAR) 25 MG tablet TAKE ONE TABLET BY MOUTH DAILY IN THE EVENING   montelukast (SINGULAIR) 10 mg, Oral, Daily at bedtime, [name]    Multiple Vitamin (MULTIVITAMIN) tablet 1 tablet, Oral, Daily   nystatin (MYCOSTATIN/NYSTOP) powder 1 application., Topical, 3 times daily   nystatin ointment (MYCOSTATIN) 1 application., Topical, 2 times daily  PRN   ONETOUCH VERIO test strip use to check blood sugar no more than twice daily   pioglitazone-metformin (ACTOPLUS MET) 15-850 MG tablet TAKE ONE TABLET BY MOUTH TWICE A DAY WITH MEALS   simvastatin (ZOCOR) 40 MG tablet TAKE ONE TABLET BY MOUTH DAILY AT BEDTIME   solifenacin (VESICARE) 10 mg, Oral, Daily       Objective:   Physical Exam BP 132/74   Pulse 75   Temp 98 F (36.7 C) (Oral)   Resp 18   Ht _0  (1.778 m)   Wt 249 lb 8 oz (113.2 kg)   SpO2 96%   BMI 35.80 kg/m  General:   Well developed, NAD, BMI noted. HEENT:  Normocephalic . Face symmetric, atraumatic   Skin:  Right fifth finger, has some redness, swelling, minimal tenderness.  No fluctuance, unable to get any discharge from the area. Has a similar  but much less prominent skin lesion at the right upper abdomen. See picture Neurologic:  alert & oriented X3.  Speech normal, gait appropriate for age and unassisted Psych--  Cognition and judgment appear intact.  Cooperative with normal attention span and concentration.  Behavior appropriate. No anxious or depressed appearing.       Assessment    Assessment DM, mild neuropathy HTN Hyperlipidemia Morbid obesity (BMI 39 plus DM) Bipolar, Depression, cognitive issues after ECT 2007 (on disability) per   psychiatry, Dr Casimiro Needle DJD-- hydrocodone rarely rx by [place] ortho Asthma-Allergies ------------ Dr [name]  GERD, h/o dysphagia (chronic, neurogenic-transfer dysphagia? See GI note 12-2011) Neuro: --? stroke : saw neuro 2013 d/t B transient visual loss, ASA changed to plavix.  --See CTA, MRIs of head-neck report  --Saw neuro 11-2013: fall, syncope,parkinsonism d/t sapharis? CV: Enlarged ascending Ao  --per CT 12-2015,CT chest 08-2016 stable.    --MRI chest 10/2017 stable, no further routine x-rays recommended --CT chest 07/2018: Atherosclerotic changes in the thoracic aorta.  Nl LE arterial dopplers 2015 Carotid artery disease: Per [place]  2011, last [place] 2018: <50% B,   08/2019 < 1-39% B, 09-2021 < 39%, stable consider recheck in 2 years  CT chest  ---RML  61m: 07-2014, CT 12-2015, CT 08-2016, MRI chest 12,2018, CT chest 07/2018:Stable  HOH: L deaf L, R  hearing aid H/o Mnire's disease  Iron deficiency anemia: Noted 01/2018, + Hemoccult, 09-2018: Colonoscopy and EGD were completely normal    PLAN: Early cellulitis, due to insect bite?. Plan: Keflex, topical steroids, ice, Tylenol.  If no better he will let me know.  Plan of care was discussed with the patient and the wife, see AVS, they verbalized understanding. Balanoposthitis: See LOV, reports she is improved    =====5-17  Balanoposthitis: Patient is diabetic, not on SGLT2's, presents with balanoposthitis for months. Admits  that he occasionally leaks urine. Was empirically prescribed nystatin, improved to some extent. Plan: Pull back the foreskin frequently, keep the area as clean and as dry as possible. Apply Nizoral plus hydrocortisone 2.5% twice daily Once better, consider Desitin. If no significant improvement, consider referral to urology

## 2022-04-20 ENCOUNTER — Other Ambulatory Visit (HOSPITAL_BASED_OUTPATIENT_CLINIC_OR_DEPARTMENT_OTHER): Payer: Self-pay

## 2022-04-20 NOTE — Assessment & Plan Note (Signed)
Early cellulitis, due to insect bite?. Plan: Keflex, topical steroids, ice, Tylenol.  If no better he will let me know.  Plan of care was discussed with the patient and the wife, see AVS, they verbalized understanding. Balanoposthitis: See LOV, reports she is improved

## 2022-04-21 ENCOUNTER — Encounter: Payer: Self-pay | Admitting: Internal Medicine

## 2022-04-22 DIAGNOSIS — Z723 Lack of physical exercise: Secondary | ICD-10-CM | POA: Diagnosis not present

## 2022-04-22 DIAGNOSIS — M6281 Muscle weakness (generalized): Secondary | ICD-10-CM | POA: Diagnosis not present

## 2022-04-22 DIAGNOSIS — Z9181 History of falling: Secondary | ICD-10-CM | POA: Diagnosis not present

## 2022-04-22 DIAGNOSIS — R262 Difficulty in walking, not elsewhere classified: Secondary | ICD-10-CM | POA: Diagnosis not present

## 2022-04-27 DIAGNOSIS — M6281 Muscle weakness (generalized): Secondary | ICD-10-CM | POA: Diagnosis not present

## 2022-04-27 DIAGNOSIS — Z723 Lack of physical exercise: Secondary | ICD-10-CM | POA: Diagnosis not present

## 2022-04-27 DIAGNOSIS — Z9181 History of falling: Secondary | ICD-10-CM | POA: Diagnosis not present

## 2022-04-27 DIAGNOSIS — R262 Difficulty in walking, not elsewhere classified: Secondary | ICD-10-CM | POA: Diagnosis not present

## 2022-04-28 ENCOUNTER — Other Ambulatory Visit: Payer: Self-pay | Admitting: Internal Medicine

## 2022-04-28 DIAGNOSIS — J3081 Allergic rhinitis due to animal (cat) (dog) hair and dander: Secondary | ICD-10-CM | POA: Diagnosis not present

## 2022-04-28 DIAGNOSIS — J301 Allergic rhinitis due to pollen: Secondary | ICD-10-CM | POA: Diagnosis not present

## 2022-04-28 DIAGNOSIS — J3089 Other allergic rhinitis: Secondary | ICD-10-CM | POA: Diagnosis not present

## 2022-04-29 DIAGNOSIS — Z723 Lack of physical exercise: Secondary | ICD-10-CM | POA: Diagnosis not present

## 2022-04-29 DIAGNOSIS — M6281 Muscle weakness (generalized): Secondary | ICD-10-CM | POA: Diagnosis not present

## 2022-04-29 DIAGNOSIS — R262 Difficulty in walking, not elsewhere classified: Secondary | ICD-10-CM | POA: Diagnosis not present

## 2022-04-29 DIAGNOSIS — Z9181 History of falling: Secondary | ICD-10-CM | POA: Diagnosis not present

## 2022-05-04 DIAGNOSIS — R262 Difficulty in walking, not elsewhere classified: Secondary | ICD-10-CM | POA: Diagnosis not present

## 2022-05-04 DIAGNOSIS — M6281 Muscle weakness (generalized): Secondary | ICD-10-CM | POA: Diagnosis not present

## 2022-05-04 DIAGNOSIS — Z9181 History of falling: Secondary | ICD-10-CM | POA: Diagnosis not present

## 2022-05-04 DIAGNOSIS — Z723 Lack of physical exercise: Secondary | ICD-10-CM | POA: Diagnosis not present

## 2022-05-05 DIAGNOSIS — J301 Allergic rhinitis due to pollen: Secondary | ICD-10-CM | POA: Diagnosis not present

## 2022-05-05 DIAGNOSIS — J3089 Other allergic rhinitis: Secondary | ICD-10-CM | POA: Diagnosis not present

## 2022-05-06 ENCOUNTER — Other Ambulatory Visit: Payer: Self-pay | Admitting: Internal Medicine

## 2022-05-06 DIAGNOSIS — R262 Difficulty in walking, not elsewhere classified: Secondary | ICD-10-CM | POA: Diagnosis not present

## 2022-05-06 DIAGNOSIS — Z9181 History of falling: Secondary | ICD-10-CM | POA: Diagnosis not present

## 2022-05-06 DIAGNOSIS — M6281 Muscle weakness (generalized): Secondary | ICD-10-CM | POA: Diagnosis not present

## 2022-05-06 DIAGNOSIS — Z723 Lack of physical exercise: Secondary | ICD-10-CM | POA: Diagnosis not present

## 2022-05-07 ENCOUNTER — Telehealth: Payer: Self-pay

## 2022-05-07 DIAGNOSIS — J3089 Other allergic rhinitis: Secondary | ICD-10-CM | POA: Diagnosis not present

## 2022-05-07 DIAGNOSIS — J300 Vasomotor rhinitis: Secondary | ICD-10-CM | POA: Diagnosis not present

## 2022-05-07 DIAGNOSIS — J4540 Moderate persistent asthma, uncomplicated: Secondary | ICD-10-CM | POA: Diagnosis not present

## 2022-05-10 ENCOUNTER — Ambulatory Visit (INDEPENDENT_AMBULATORY_CARE_PROVIDER_SITE_OTHER): Payer: Medicare Other

## 2022-05-10 VITALS — Ht 70.0 in | Wt 255.0 lb

## 2022-05-10 DIAGNOSIS — Z0000 Encounter for general adult medical examination without abnormal findings: Secondary | ICD-10-CM | POA: Diagnosis not present

## 2022-05-11 DIAGNOSIS — Z723 Lack of physical exercise: Secondary | ICD-10-CM | POA: Diagnosis not present

## 2022-05-11 DIAGNOSIS — Z9181 History of falling: Secondary | ICD-10-CM | POA: Diagnosis not present

## 2022-05-11 DIAGNOSIS — M6281 Muscle weakness (generalized): Secondary | ICD-10-CM | POA: Diagnosis not present

## 2022-05-11 DIAGNOSIS — R262 Difficulty in walking, not elsewhere classified: Secondary | ICD-10-CM | POA: Diagnosis not present

## 2022-05-12 DIAGNOSIS — J3089 Other allergic rhinitis: Secondary | ICD-10-CM | POA: Diagnosis not present

## 2022-05-12 DIAGNOSIS — J3081 Allergic rhinitis due to animal (cat) (dog) hair and dander: Secondary | ICD-10-CM | POA: Diagnosis not present

## 2022-05-12 DIAGNOSIS — J301 Allergic rhinitis due to pollen: Secondary | ICD-10-CM | POA: Diagnosis not present

## 2022-05-13 DIAGNOSIS — Z723 Lack of physical exercise: Secondary | ICD-10-CM | POA: Diagnosis not present

## 2022-05-13 DIAGNOSIS — R262 Difficulty in walking, not elsewhere classified: Secondary | ICD-10-CM | POA: Diagnosis not present

## 2022-05-13 DIAGNOSIS — Z9181 History of falling: Secondary | ICD-10-CM | POA: Diagnosis not present

## 2022-05-13 DIAGNOSIS — M6281 Muscle weakness (generalized): Secondary | ICD-10-CM | POA: Diagnosis not present

## 2022-05-17 DIAGNOSIS — M6281 Muscle weakness (generalized): Secondary | ICD-10-CM | POA: Diagnosis not present

## 2022-05-17 DIAGNOSIS — Z9181 History of falling: Secondary | ICD-10-CM | POA: Diagnosis not present

## 2022-05-17 DIAGNOSIS — Z723 Lack of physical exercise: Secondary | ICD-10-CM | POA: Diagnosis not present

## 2022-05-17 DIAGNOSIS — R262 Difficulty in walking, not elsewhere classified: Secondary | ICD-10-CM | POA: Diagnosis not present

## 2022-05-19 DIAGNOSIS — J3081 Allergic rhinitis due to animal (cat) (dog) hair and dander: Secondary | ICD-10-CM | POA: Diagnosis not present

## 2022-05-19 DIAGNOSIS — J301 Allergic rhinitis due to pollen: Secondary | ICD-10-CM | POA: Diagnosis not present

## 2022-05-19 DIAGNOSIS — J3089 Other allergic rhinitis: Secondary | ICD-10-CM | POA: Diagnosis not present

## 2022-05-24 DIAGNOSIS — R262 Difficulty in walking, not elsewhere classified: Secondary | ICD-10-CM | POA: Diagnosis not present

## 2022-05-24 DIAGNOSIS — Z9181 History of falling: Secondary | ICD-10-CM | POA: Diagnosis not present

## 2022-05-24 DIAGNOSIS — Z723 Lack of physical exercise: Secondary | ICD-10-CM | POA: Diagnosis not present

## 2022-05-24 DIAGNOSIS — M6281 Muscle weakness (generalized): Secondary | ICD-10-CM | POA: Diagnosis not present

## 2022-05-26 ENCOUNTER — Other Ambulatory Visit: Payer: Self-pay | Admitting: Internal Medicine

## 2022-05-26 DIAGNOSIS — J301 Allergic rhinitis due to pollen: Secondary | ICD-10-CM | POA: Diagnosis not present

## 2022-05-26 DIAGNOSIS — J3089 Other allergic rhinitis: Secondary | ICD-10-CM | POA: Diagnosis not present

## 2022-05-27 DIAGNOSIS — Z723 Lack of physical exercise: Secondary | ICD-10-CM | POA: Diagnosis not present

## 2022-05-27 DIAGNOSIS — M6281 Muscle weakness (generalized): Secondary | ICD-10-CM | POA: Diagnosis not present

## 2022-05-27 DIAGNOSIS — Z9181 History of falling: Secondary | ICD-10-CM | POA: Diagnosis not present

## 2022-05-27 DIAGNOSIS — R262 Difficulty in walking, not elsewhere classified: Secondary | ICD-10-CM | POA: Diagnosis not present

## 2022-06-01 DIAGNOSIS — R262 Difficulty in walking, not elsewhere classified: Secondary | ICD-10-CM | POA: Diagnosis not present

## 2022-06-01 DIAGNOSIS — Z723 Lack of physical exercise: Secondary | ICD-10-CM | POA: Diagnosis not present

## 2022-06-01 DIAGNOSIS — M6281 Muscle weakness (generalized): Secondary | ICD-10-CM | POA: Diagnosis not present

## 2022-06-01 DIAGNOSIS — Z9181 History of falling: Secondary | ICD-10-CM | POA: Diagnosis not present

## 2022-06-02 DIAGNOSIS — J3081 Allergic rhinitis due to animal (cat) (dog) hair and dander: Secondary | ICD-10-CM | POA: Diagnosis not present

## 2022-06-02 DIAGNOSIS — J301 Allergic rhinitis due to pollen: Secondary | ICD-10-CM | POA: Diagnosis not present

## 2022-06-02 DIAGNOSIS — J3089 Other allergic rhinitis: Secondary | ICD-10-CM | POA: Diagnosis not present

## 2022-06-03 DIAGNOSIS — Z723 Lack of physical exercise: Secondary | ICD-10-CM | POA: Diagnosis not present

## 2022-06-03 DIAGNOSIS — R262 Difficulty in walking, not elsewhere classified: Secondary | ICD-10-CM | POA: Diagnosis not present

## 2022-06-03 DIAGNOSIS — Z9181 History of falling: Secondary | ICD-10-CM | POA: Diagnosis not present

## 2022-06-03 DIAGNOSIS — M6281 Muscle weakness (generalized): Secondary | ICD-10-CM | POA: Diagnosis not present

## 2022-06-07 DIAGNOSIS — M6281 Muscle weakness (generalized): Secondary | ICD-10-CM | POA: Diagnosis not present

## 2022-06-07 DIAGNOSIS — Z9181 History of falling: Secondary | ICD-10-CM | POA: Diagnosis not present

## 2022-06-07 DIAGNOSIS — R262 Difficulty in walking, not elsewhere classified: Secondary | ICD-10-CM | POA: Diagnosis not present

## 2022-06-07 DIAGNOSIS — Z723 Lack of physical exercise: Secondary | ICD-10-CM | POA: Diagnosis not present

## 2022-06-08 ENCOUNTER — Telehealth: Payer: Self-pay

## 2022-06-08 NOTE — Telephone Encounter (Signed)
PT plan of care signed and faxed back to [facility] at [phone]. Form sent for scanning.  

## 2022-06-09 DIAGNOSIS — J3081 Allergic rhinitis due to animal (cat) (dog) hair and dander: Secondary | ICD-10-CM | POA: Diagnosis not present

## 2022-06-09 DIAGNOSIS — J3089 Other allergic rhinitis: Secondary | ICD-10-CM | POA: Diagnosis not present

## 2022-06-09 DIAGNOSIS — J301 Allergic rhinitis due to pollen: Secondary | ICD-10-CM | POA: Diagnosis not present

## 2022-06-11 DIAGNOSIS — Z9181 History of falling: Secondary | ICD-10-CM | POA: Diagnosis not present

## 2022-06-11 DIAGNOSIS — R262 Difficulty in walking, not elsewhere classified: Secondary | ICD-10-CM | POA: Diagnosis not present

## 2022-06-11 DIAGNOSIS — M6281 Muscle weakness (generalized): Secondary | ICD-10-CM | POA: Diagnosis not present

## 2022-06-11 DIAGNOSIS — Z723 Lack of physical exercise: Secondary | ICD-10-CM | POA: Diagnosis not present

## 2022-06-14 DIAGNOSIS — M6281 Muscle weakness (generalized): Secondary | ICD-10-CM | POA: Diagnosis not present

## 2022-06-14 DIAGNOSIS — R262 Difficulty in walking, not elsewhere classified: Secondary | ICD-10-CM | POA: Diagnosis not present

## 2022-06-14 DIAGNOSIS — Z9181 History of falling: Secondary | ICD-10-CM | POA: Diagnosis not present

## 2022-06-14 DIAGNOSIS — Z723 Lack of physical exercise: Secondary | ICD-10-CM | POA: Diagnosis not present

## 2022-06-16 DIAGNOSIS — J301 Allergic rhinitis due to pollen: Secondary | ICD-10-CM | POA: Diagnosis not present

## 2022-06-16 DIAGNOSIS — J3081 Allergic rhinitis due to animal (cat) (dog) hair and dander: Secondary | ICD-10-CM | POA: Diagnosis not present

## 2022-06-16 DIAGNOSIS — J3089 Other allergic rhinitis: Secondary | ICD-10-CM | POA: Diagnosis not present

## 2022-06-17 DIAGNOSIS — M6281 Muscle weakness (generalized): Secondary | ICD-10-CM | POA: Diagnosis not present

## 2022-06-17 DIAGNOSIS — Z723 Lack of physical exercise: Secondary | ICD-10-CM | POA: Diagnosis not present

## 2022-06-17 DIAGNOSIS — R262 Difficulty in walking, not elsewhere classified: Secondary | ICD-10-CM | POA: Diagnosis not present

## 2022-06-17 DIAGNOSIS — Z9181 History of falling: Secondary | ICD-10-CM | POA: Diagnosis not present

## 2022-06-22 DIAGNOSIS — R262 Difficulty in walking, not elsewhere classified: Secondary | ICD-10-CM | POA: Diagnosis not present

## 2022-06-22 DIAGNOSIS — Z723 Lack of physical exercise: Secondary | ICD-10-CM | POA: Diagnosis not present

## 2022-06-22 DIAGNOSIS — M6281 Muscle weakness (generalized): Secondary | ICD-10-CM | POA: Diagnosis not present

## 2022-06-22 DIAGNOSIS — Z9181 History of falling: Secondary | ICD-10-CM | POA: Diagnosis not present

## 2022-06-23 DIAGNOSIS — J301 Allergic rhinitis due to pollen: Secondary | ICD-10-CM | POA: Diagnosis not present

## 2022-06-23 DIAGNOSIS — J3081 Allergic rhinitis due to animal (cat) (dog) hair and dander: Secondary | ICD-10-CM | POA: Diagnosis not present

## 2022-06-23 DIAGNOSIS — J3089 Other allergic rhinitis: Secondary | ICD-10-CM | POA: Diagnosis not present

## 2022-06-24 DIAGNOSIS — Z723 Lack of physical exercise: Secondary | ICD-10-CM | POA: Diagnosis not present

## 2022-06-24 DIAGNOSIS — R262 Difficulty in walking, not elsewhere classified: Secondary | ICD-10-CM | POA: Diagnosis not present

## 2022-06-24 DIAGNOSIS — M6281 Muscle weakness (generalized): Secondary | ICD-10-CM | POA: Diagnosis not present

## 2022-06-24 DIAGNOSIS — Z9181 History of falling: Secondary | ICD-10-CM | POA: Diagnosis not present

## 2022-06-28 DIAGNOSIS — Z723 Lack of physical exercise: Secondary | ICD-10-CM | POA: Diagnosis not present

## 2022-06-28 DIAGNOSIS — Z9181 History of falling: Secondary | ICD-10-CM | POA: Diagnosis not present

## 2022-06-28 DIAGNOSIS — R262 Difficulty in walking, not elsewhere classified: Secondary | ICD-10-CM | POA: Diagnosis not present

## 2022-06-28 DIAGNOSIS — M6281 Muscle weakness (generalized): Secondary | ICD-10-CM | POA: Diagnosis not present

## 2022-06-30 DIAGNOSIS — J3081 Allergic rhinitis due to animal (cat) (dog) hair and dander: Secondary | ICD-10-CM | POA: Diagnosis not present

## 2022-06-30 DIAGNOSIS — J3089 Other allergic rhinitis: Secondary | ICD-10-CM | POA: Diagnosis not present

## 2022-06-30 DIAGNOSIS — J301 Allergic rhinitis due to pollen: Secondary | ICD-10-CM | POA: Diagnosis not present

## 2022-07-01 DIAGNOSIS — Z9181 History of falling: Secondary | ICD-10-CM | POA: Diagnosis not present

## 2022-07-01 DIAGNOSIS — R262 Difficulty in walking, not elsewhere classified: Secondary | ICD-10-CM | POA: Diagnosis not present

## 2022-07-01 DIAGNOSIS — M6281 Muscle weakness (generalized): Secondary | ICD-10-CM | POA: Diagnosis not present

## 2022-07-01 DIAGNOSIS — Z723 Lack of physical exercise: Secondary | ICD-10-CM | POA: Diagnosis not present

## 2022-07-05 DIAGNOSIS — M6281 Muscle weakness (generalized): Secondary | ICD-10-CM | POA: Diagnosis not present

## 2022-07-05 DIAGNOSIS — R262 Difficulty in walking, not elsewhere classified: Secondary | ICD-10-CM | POA: Diagnosis not present

## 2022-07-05 DIAGNOSIS — Z723 Lack of physical exercise: Secondary | ICD-10-CM | POA: Diagnosis not present

## 2022-07-05 DIAGNOSIS — Z9181 History of falling: Secondary | ICD-10-CM | POA: Diagnosis not present

## 2022-07-07 DIAGNOSIS — J301 Allergic rhinitis due to pollen: Secondary | ICD-10-CM | POA: Diagnosis not present

## 2022-07-07 DIAGNOSIS — J3089 Other allergic rhinitis: Secondary | ICD-10-CM | POA: Diagnosis not present

## 2022-07-07 DIAGNOSIS — J3081 Allergic rhinitis due to animal (cat) (dog) hair and dander: Secondary | ICD-10-CM | POA: Diagnosis not present

## 2022-07-09 ENCOUNTER — Other Ambulatory Visit (HOSPITAL_BASED_OUTPATIENT_CLINIC_OR_DEPARTMENT_OTHER): Payer: Self-pay

## 2022-07-09 ENCOUNTER — Ambulatory Visit
Admission: RE | Admit: 2022-07-09 | Discharge: 2022-07-09 | Disposition: A | Discharge status: Home / Self Care | Payer: Medicare Other | Source: Ambulatory Visit | Attending: Emergency Medicine | Admitting: Emergency Medicine

## 2022-07-09 VITALS — BP 151/93 | HR 72 | Temp 97.8°F | Resp 18

## 2022-07-09 DIAGNOSIS — L02414 Cutaneous abscess of left upper limb: Secondary | ICD-10-CM | POA: Diagnosis not present

## 2022-07-09 DIAGNOSIS — L03114 Cellulitis of left upper limb: Secondary | ICD-10-CM

## 2022-07-09 MED ORDER — DOXYCYCLINE MONOHYDRATE 100 MG PO TABS: 100.0000 mg | ORAL_TABLET | Freq: Two times a day (BID) | ORAL | 0 refills | Status: DC

## 2022-07-09 MED ORDER — AMOXICILLIN-POT CLAVULANATE 875-125 MG PO TABS: 1.0000 | ORAL_TABLET | Freq: Two times a day (BID) | ORAL | 0 refills | Status: DC

## 2022-07-09 MED ORDER — DOXYCYCLINE MONOHYDRATE 100 MG PO TABS
100.0000 mg | ORAL_TABLET | Freq: Two times a day (BID) | ORAL | 0 refills | Status: AC
Start: 2022-07-09 — End: 2022-07-16
  Filled 2022-07-09: qty 14, 7d supply, fill #0

## 2022-07-09 MED ORDER — AMOXICILLIN-POT CLAVULANATE 875-125 MG PO TABS
1.0000 | ORAL_TABLET | Freq: Two times a day (BID) | ORAL | 0 refills | Status: AC
  Filled 2022-07-09: qty 14, 7d supply, fill #0

## 2022-07-09 NOTE — ED Provider Notes (Signed)
[facility]    CSN: [id number] Arrival date & time: 07/09/22  6004    HISTORY   Chief Complaint  Patient presents with   Burn    I burned my left fore arm approx. 2 weeks ago. It was healing nicely. It started hurting Tuesday the 22rd, and is now red, swollen, warm, and painful. I also have a similar area on my chest. I am a diabetic. - Entered by patient   lump   HPI Ryan Zhang is a pleasant, 76 y.o. male who presents to urgent care today. The patient states 2 weeks ago he burnt his left forearm, the patient states the healing process went well, was applying Silvadene cream but on 3 days ago he began having pain and inflammation to the area. The patient has a lump to his left forearm surrounded by redness that is tender to touch and firm as well as another 1 on his chest that is smaller but also tender to touch and firm.  Patient reports a history of type 2 diabetes which she states is well controlled.  The history is provided by the patient.   Past Medical History:  Diagnosis Date   Allergy    allergy shots Dr. Velora Heckler   Anemia    Anxiety    Asthma    moderate persistant   Bipolar affective (HCC)    Cataract    both eyes   COPD (chronic obstructive pulmonary disease) (HCC)    Depression    Diabetes mellitus    DJD (degenerative joint disease)    Dysphagia    Gallstones    hx of, s/p cholecystectomy   GERD (gastroesophageal reflux disease)    Hepatitis A    as teenager 60's   Hollenhorst plaque    right eye   Hyperlipidemia    Hypertension    Kidney stones    hx of   Meniere's disease    Motility disorder, esophageal    Obesity    Pancreatitis    Personal history of colonic polyps 11/2004   hyperplastic.   Stroke [facility])    per MRI   Patient Active Problem List   Diagnosis Date Noted   Tinea cruris 07/24/2020   PCP NOTES >>>>>>>>>>>>>>>>>>>>>>>>>>>>>>> 01/12/2016   Dry skin 59/97/7414   Folliculitis 23/95/3202   Hearing loss in right ear  08/05/2014   Pre-ulcerative corn or callous 12/27/2013   Panic attack as reaction to stress 04/20/2012   Dysphagia, neurologic 01/07/2012   Morbid obesity ([place]) 01/07/2012   Chest pain, musculoskeletal 12/07/2011   Black-out (not amnesia) 12/07/2011   Candidiasis of skin 08/12/2011   Annual physical exam 06/07/2011   OLECRANON BURSITIS 01/29/2011   PARTIAL ARTERIAL OCCLUSION OF RETINA 05/27/2010   Essential hypertension, benign 01/16/2010   ABNORMAL ELECTROCARDIOGRAM 01/14/2010   Asthma 10/14/2009   GERD 10/14/2009   Bipolar disorder ([place]) 02/20/2009   PHIMOSIS 02/20/2009   KNEE PAIN, CHRONIC 11/20/2008   HIP PAIN, RIGHT 08/19/2008   DM type 2 with diabetic peripheral neuropathy ([place]) 02/22/2007   Hyperlipidemia associated with type 2 diabetes mellitus ([place]) 02/22/2007   ALLERGIC RHINITIS, SEASONAL 02/22/2007   LOW BACK PAIN, CHRONIC 02/22/2007   MENIERE'S DISEASE, HX OF 02/22/2007   Past Surgical History:  Procedure Laterality Date   CATARACT EXTRACTION Bilateral 09/2015 and 10/2015   CHOLECYSTECTOMY     COLONOSCOPY     TOTAL KNEE ARTHROPLASTY Bilateral    both knees    Home Medications    Prior to  Admission medications   Medication Sig Start Date End Date Taking? Authorizing Provider  acetaminophen (TYLENOL) 325 MG tablet Take 2 tablets (650 mg total) by mouth every 6 (six) hours as needed (mild pain; headache). 02/06/20   Georgette Shell, MD  albuterol (PROVENTIL) (2.5 MG/3ML) 0.083% nebulizer solution Take 3 mLs (2.5 mg total) by nebulization every 6 (six) hours as needed for wheezing or shortness of breath. 12/28/21   Colon Branch, MD  albuterol (VENTOLIN HFA) 108 (90 Base) MCG/ACT inhaler Inhale 2 puffs into the lungs every 6 (six) hours as needed for wheezing or shortness of breath. 02/11/20   Colon Branch, MD  augmented betamethasone dipropionate (DIPROLENE-AF) 0.05 % cream Apply topically 2 (two) times daily. 04/19/22   Colon Branch, MD  azelastine (ASTELIN) 0.1 %  nasal spray Place 1 spray into both nostrils as needed for rhinitis. To replace Flonase    [provider]  budesonide-formoterol (SYMBICORT) 160-4.5 MCG/ACT inhaler Inhale 2 puffs into the lungs 2 (two) times daily.     [provider]  buPROPion (WELLBUTRIN XL) 150 MG 24 hr tablet Take 150 mg by mouth daily. 02/15/21   [provider]  cephALEXin (KEFLEX) 500 MG capsule Take 1 capsule (500 mg total) by mouth 4 (four) times daily. Patient not taking: Reported on 05/10/2022 04/19/22   Colon Branch, MD  clonazePAM (KLONOPIN) 0.5 MG tablet Take 0.5 mg by mouth See admin instructions. #2 at 8am, #1 at 2pm [facility])    [provider]  clopidogrel (PLAVIX) 75 MG tablet TAKE ONE TABLET BY MOUTH ONE TIME DAILY 05/07/22   Colon Branch, MD  DULoxetine (CYMBALTA) 60 MG capsule Take 60 mg by mouth every morning.    [provider]  EPIPEN 2-PAK 0.3 MG/0.3ML SOAJ injection Reported on 04/29/2016 07/04/14   [provider]  esomeprazole (NEXIUM) 40 MG capsule Take 40 mg by mouth every morning.     [provider]  fexofenadine (ALLEGRA) 180 MG tablet Take 180 mg by mouth every morning. 8am [name])    [provider]  fluticasone (VERAMYST) 27.5 MCG/SPRAY nasal spray Place 2 sprays into the nose daily as needed for rhinitis or allergies.     [provider]  hydrocortisone 2.5 % cream Apply topically 2 (two) times daily. 03/31/22 03/31/23  Colon Branch, MD  ketoconazole (NIZORAL) 2 % cream Apply 1 application. topically daily. 03/31/22   Colon Branch, MD  lamoTRIgine (LAMICTAL) 25 MG tablet Take 50 mg by mouth at bedtime.    [provider]  Lancets [name] ULTRASOFT) lancets Check blood sugars no more than twice daily 12/09/16   Colon Branch, MD  losartan (COZAAR) 25 MG tablet Take 1 tablet (25 mg total) by mouth every evening. 04/28/22   Colon Branch, MD  montelukast (SINGULAIR) 10 MG tablet Take 10 mg by mouth at bedtime. [name]     [provider]  Multiple Vitamin (MULTIVITAMIN) tablet Take 1 tablet by mouth daily.     [provider]  nystatin (MYCOSTATIN/NYSTOP) powder Apply 1 application. topically 3 (three) times daily.    [provider]  nystatin ointment (MYCOSTATIN) Apply 1 application. topically 2 (two) times daily as needed. 03/19/22   Colon Branch, MD  [facility] VERIO test strip use to check blood sugar no more than twice daily 12/03/19   Colon Branch, MD  pioglitazone-metformin (ACTOPLUS MET) [phone] MG tablet TAKE ONE TABLET BY MOUTH TWICE DAILY  WITH MEALS 05/26/22   Colon Branch, MD  simvastatin (ZOCOR) 40 MG tablet TAKE ONE TABLET BY MOUTH DAILY AT BEDTIME 02/08/22   Colon Branch, MD  solifenacin (VESICARE) 10 MG tablet Take 1 tablet (10 mg total) by mouth daily. 11/26/20   Colon Branch, MD    Family History Family History  Problem Relation Age of Onset   Diabetes Father    Heart disease Brother    Heart disease Maternal Grandmother    Cancer Sister        unknown type   Colon cancer Neg Hx    Prostate cancer Neg Hx    Colon polyps Neg Hx    Esophageal cancer Neg Hx    Rectal cancer Neg Hx    Stomach cancer Neg Hx    Social History Social History   Tobacco Use   Smoking status: Never   Smokeless tobacco: Never  Substance Use Topics   Alcohol use: Not Currently   Drug use: No   Allergies   Celexa  [citalopram hydrobromide], Depakote  [divalproex sodium], Methocarbamol, Paroxetine hcl, Smz-tmp ds [sulfamethoxazole-trimethoprim], Other, Oxycodone-acetaminophen, Percocet [oxycodone-acetaminophen], and Risperidone  Review of Systems Review of Systems Pertinent findings revealed after performing a 14 point review of systems has been noted in the history of present illness.  Physical Exam Triage Vital Signs ED Triage Vitals  Enc Vitals Group     BP 09/11/21 0827 (!) 147/82     Pulse Rate 09/11/21 0827 72     Resp 09/11/21 0827 18     Temp 09/11/21 0827 98.3 F (36.8 C)      Temp Source 09/11/21 0827 Oral     SpO2 09/11/21 0827 98 %     Weight --      Height --      Head Circumference --      Peak Flow --      Pain Score 09/11/21 0826 5     Pain Loc --      Pain Edu? --      Excl. in [place]? --   No data found.  Updated Vital Signs BP (!) 151/93 (BP Location: Right Arm)   Pulse 72   Temp 97.8 F (36.6 C) (Oral)   Resp 18   SpO2 95%   Physical Exam Vitals and nursing note reviewed.  Constitutional:      General: He is not in acute distress.    Appearance: Normal appearance. He is normal weight. He is not ill-appearing.  HENT:     Head: Normocephalic and atraumatic.  Eyes:     Extraocular Movements: Extraocular movements intact.     Conjunctiva/sclera: Conjunctivae normal.     Pupils: Pupils are equal, round, and reactive to light.  Cardiovascular:     Rate and Rhythm: Normal rate and regular rhythm.  Pulmonary:     Effort: Pulmonary effort is normal.     Breath sounds: Normal breath sounds.  Musculoskeletal:        General: Normal range of motion.     Cervical back: Normal range of motion and neck supple.  Skin:    General: Skin is warm and dry.     Findings: Lesion (Please see photo below, patient has a similar albeit smaller lesion on left upper chest as well.) present.  Neurological:     General: No focal deficit present.     Mental Status: He is alert and oriented to person, place, and time. Mental status is at baseline.  Psychiatric:        Mood and Affect: Mood normal.        Behavior: Behavior normal.        Thought Content: Thought content normal.        Judgment: Judgment normal.      Visual Acuity Right Eye Distance:   Left Eye Distance:   Bilateral Distance:    Right Eye Near:   Left Eye Near:    Bilateral Near:     UC Couse / Diagnostics / Procedures:     Radiology No results found.  Procedures Procedures (including critical care time) EKG  Pending results:  Labs Reviewed - No data to  display  Medications Ordered in UC: Medications - No data to display  UC Diagnoses / Final Clinical Impressions(s)   I have reviewed the triage vital signs and the nursing notes.  Pertinent labs & imaging results that were available during my care of the patient were reviewed by me and considered in my medical decision making (see chart for details).    Final diagnoses:  Left arm cellulitis  Abscess of arm, left   Patient politely declined my offer to incise and drain the lesion on his forearm.  Patient was agreeable to taking antibiotics.  Due to patient intolerant of Bactrim, prescribed Augmentin and doxycycline instead.  Patient advised to follow-up with PCP if no improvement or return to urgent care for repeat evaluation if concerned.  ED Prescriptions     Medication Sig Dispense Auth. Provider   doxycycline (ADOXA) 100 MG tablet  (Status: Discontinued) Take 1 tablet (100 mg total) by mouth 2 (two) times daily for 7 days. 14 tablet Lynden Oxford Scales, PA-C   amoxicillin-clavulanate (AUGMENTIN) 875-125 MG tablet  (Status: Discontinued) Take 1 tablet by mouth 2 (two) times daily for 7 days. 14 tablet Lynden Oxford Scales, PA-C   amoxicillin-clavulanate (AUGMENTIN) 875-125 MG tablet Take 1 tablet by mouth 2 (two) times daily for 7 days. 14 tablet Lynden Oxford Scales, PA-C   doxycycline (ADOXA) 100 MG tablet Take 1 tablet (100 mg total) by mouth 2 (two) times daily for 7 days. 14 tablet Lynden Oxford Scales, PA-C      PDMP not reviewed this encounter.  Pending results:  Labs Reviewed - No data to display  Discharge Instructions:   Discharge Instructions      There is documentation in your chart that Bactrim gives you severe nausea.  For this reason, I have instead provided 2 antibiotics, amoxicillin-clavulanate and doxycycline which will provide you with the same benefit that Bactrim can.  Please take 1 of each daily for the next 5 days.  If you are not satisfied  that the infection is completely resolved, please complete the full 7-day prescription.  After finishing full 7 days, if you still do not feel like the lesion is completely resolved, please follow-up with either your primary care or return to urgent care.  Alternately, after 3 to 4 days if you see no meaningful improvement, I also recommend that you either follow-up with your primary care or urgent care to adjust your treatment.  Thank you for visiting urgent care today.      Disposition Upon Discharge:  Condition: stable for discharge home  Patient presented with an acute illness with associated systemic symptoms and significant discomfort requiring urgent management. In my opinion, this is a condition that a prudent lay person (someone who possesses an average knowledge of health and medicine) may potentially expect to result in  complications if not addressed urgently such as respiratory distress, impairment of bodily function or dysfunction of bodily organs.   Routine symptom specific, illness specific and/or disease specific instructions were discussed with the patient and/or caregiver at length.   As such, the patient has been evaluated and assessed, work-up was performed and treatment was provided in alignment with urgent care protocols and evidence based medicine.  Patient/parent/caregiver has been advised that the patient may require follow up for further testing and treatment if the symptoms continue in spite of treatment, as clinically indicated and appropriate.  Patient/parent/caregiver has been advised to return to the [facility] or PCP if no better; to PCP or the Emergency Department if new signs and symptoms develop, or if the current signs or symptoms continue to change or worsen for further workup, evaluation and treatment as clinically indicated and appropriate  The patient will follow up with their current PCP if and as advised. If the patient does not currently have a PCP we will  assist them in obtaining one.   The patient may need specialty follow up if the symptoms continue, in spite of conservative treatment and management, for further workup, evaluation, consultation and treatment as clinically indicated and appropriate.   Patient/parent/caregiver verbalized understanding and agreement of plan as discussed.  All questions were addressed during visit.  Please see discharge instructions below for further details of plan.  This office note has been dictated using Museum/gallery curator.  Unfortunately, this method of dictation can sometimes lead to typographical or grammatical errors.  I apologize for your inconvenience in advance if this occurs.  Please do not hesitate to reach out to me if clarification is needed.      Lynden Oxford Scales, PA-C 07/09/22 1440

## 2022-07-09 NOTE — Discharge Instructions (Signed)
There is documentation in your chart that Bactrim gives you severe nausea.  For this reason, I have instead provided 2 antibiotics, amoxicillin-clavulanate and doxycycline which will provide you with the same benefit that Bactrim can.  Please take 1 of each daily for the next 5 days.  If you are not satisfied that the infection is completely resolved, please complete the full 7-day prescription.  After finishing full 7 days, if you still do not feel like the lesion is completely resolved, please follow-up with either your primary care or return to urgent care.  Alternately, after 3 to 4 days if you see no meaningful improvement, I also recommend that you either follow-up with your primary care or urgent care to adjust your treatment.  Thank you for visiting urgent care today.

## 2022-07-09 NOTE — ED Triage Notes (Signed)
The patient states 2 weeks ago he burnt his left FA, the patient states the healing process went well but on Tuesday he began having pain and inflammation to the area.   The patient has a lump to his left FA and chest that is reddened.

## 2022-07-14 DIAGNOSIS — J3081 Allergic rhinitis due to animal (cat) (dog) hair and dander: Secondary | ICD-10-CM | POA: Diagnosis not present

## 2022-07-14 DIAGNOSIS — J301 Allergic rhinitis due to pollen: Secondary | ICD-10-CM | POA: Diagnosis not present

## 2022-07-14 DIAGNOSIS — J3089 Other allergic rhinitis: Secondary | ICD-10-CM | POA: Diagnosis not present

## 2022-07-18 ENCOUNTER — Ambulatory Visit (INDEPENDENT_AMBULATORY_CARE_PROVIDER_SITE_OTHER): Payer: Medicare Other

## 2022-07-18 ENCOUNTER — Ambulatory Visit
Admission: RE | Admit: 2022-07-18 | Discharge: 2022-07-18 | Disposition: A | Discharge status: Home / Self Care | Payer: Medicare Other | Source: Ambulatory Visit | Attending: Urgent Care | Admitting: Urgent Care

## 2022-07-18 VITALS — BP 144/82 | HR 77 | Temp 97.5°F | Resp 18

## 2022-07-18 DIAGNOSIS — S52515A Nondisplaced fracture of left radial styloid process, initial encounter for closed fracture: Secondary | ICD-10-CM | POA: Diagnosis not present

## 2022-07-18 DIAGNOSIS — M25532 Pain in left wrist: Secondary | ICD-10-CM

## 2022-07-18 MED ORDER — HYDROCODONE-ACETAMINOPHEN 5-325 MG PO TABS: 1.0000 | ORAL_TABLET | Freq: Four times a day (QID) | ORAL | 0 refills | Status: DC | PRN

## 2022-07-18 MED ORDER — ACETAMINOPHEN 325 MG PO TABS
975.0000 mg | ORAL_TABLET | Freq: Once | ORAL | Status: AC
  Administered 2022-07-18: 975 mg via ORAL

## 2022-07-18 NOTE — ED Triage Notes (Signed)
Pt. Stated after getting ready to walk the dog he tripped on uneven pavement and injured his left wrist. He has tried to splint it himself and has been treating the pain with OTC medication.

## 2022-07-18 NOTE — ED Provider Notes (Signed)
[facility]  Note:  This document was prepared using Systems analyst and may include unintentional dictation errors.  MRN: [id number] DOB: [birth year]  Subjective:   Ryan Zhang is a 76 y.o. male presenting for suffering a left wrist injury.  Patient was getting ready to walk the dog and unfortunately tripped on an uneven pavement and landed directly onto his left wrist.  He tried to applied a splint and wanted to come into the clinic for an evaluation.  No head injury, loss consciousness, confusion.  No current facility-administered medications for this encounter.  Current Outpatient Medications:    acetaminophen (TYLENOL) 325 MG tablet, Take 2 tablets (650 mg total) by mouth every 6 (six) hours as needed (mild pain; headache)., Disp:  , Rfl:    albuterol (PROVENTIL) (2.5 MG/3ML) 0.083% nebulizer solution, Take 3 mLs (2.5 mg total) by nebulization every 6 (six) hours as needed for wheezing or shortness of breath., Disp: 180 mL, Rfl: 1   albuterol (VENTOLIN HFA) 108 (90 Base) MCG/ACT inhaler, Inhale 2 puffs into the lungs every 6 (six) hours as needed for wheezing or shortness of breath., Disp: 18 g, Rfl: 6   augmented betamethasone dipropionate (DIPROLENE-AF) 0.05 % cream, Apply topically 2 (two) times daily., Disp: 60 g, Rfl: 0   azelastine (ASTELIN) 0.1 % nasal spray, Place 1 spray into both nostrils as needed for rhinitis. To replace Flonase, Disp: , Rfl:    budesonide-formoterol (SYMBICORT) 160-4.5 MCG/ACT inhaler, Inhale 2 puffs into the lungs 2 (two) times daily. , Disp: , Rfl:    buPROPion (WELLBUTRIN XL) 150 MG 24 hr tablet, Take 150 mg by mouth daily., Disp: , Rfl:    clonazePAM (KLONOPIN) 0.5 MG tablet, Take 0.5 mg by mouth See admin instructions. #2 at 8am, #1 at 2pm [facility]), Disp: , Rfl:    clopidogrel (PLAVIX) 75 MG tablet, TAKE ONE TABLET BY MOUTH ONE TIME DAILY, Disp: 90 tablet, Rfl: 1   DULoxetine (CYMBALTA) 60 MG capsule, Take 60  mg by mouth every morning., Disp: , Rfl:    EPIPEN 2-PAK 0.3 MG/0.3ML SOAJ injection, Reported on 04/29/2016, Disp: , Rfl:    esomeprazole (NEXIUM) 40 MG capsule, Take 40 mg by mouth every morning. , Disp: , Rfl:    fexofenadine (ALLEGRA) 180 MG tablet, Take 180 mg by mouth every morning. 8am [name]), Disp: , Rfl:    fluticasone (VERAMYST) 27.5 MCG/SPRAY nasal spray, Place 2 sprays into the nose daily as needed for rhinitis or allergies. , Disp: , Rfl:    hydrocortisone 2.5 % cream, Apply topically 2 (two) times daily., Disp: 60 g, Rfl: 0   ketoconazole (NIZORAL) 2 % cream, Apply 1 application. topically daily., Disp: 60 g, Rfl: 0   lamoTRIgine (LAMICTAL) 25 MG tablet, Take 50 mg by mouth at bedtime., Disp: , Rfl:    Lancets (ONETOUCH ULTRASOFT) lancets, Check blood sugars no more than twice daily, Disp: 100 each, Rfl: 12   losartan (COZAAR) 25 MG tablet, Take 1 tablet (25 mg total) by mouth every evening., Disp: 90 tablet, Rfl: 1   montelukast (SINGULAIR) 10 MG tablet, Take 10 mg by mouth at bedtime. [name], Disp: , Rfl:    Multiple Vitamin (MULTIVITAMIN) tablet, Take 1 tablet by mouth daily. , Disp: , Rfl:    nystatin (MYCOSTATIN/NYSTOP) powder, Apply 1 application. topically 3 (three) times daily., Disp: , Rfl:    nystatin ointment (MYCOSTATIN), Apply 1 application. topically 2 (two) times daily as needed., Disp:  30 g, Rfl: 0   ONETOUCH VERIO test strip, use to check blood sugar no more than twice daily, Disp: 100 each, Rfl: 12   pioglitazone-metformin (ACTOPLUS MET) 15-850 MG tablet, TAKE ONE TABLET BY MOUTH TWICE DAILY WITH MEALS, Disp: 180 tablet, Rfl: 1   simvastatin (ZOCOR) 40 MG tablet, TAKE ONE TABLET BY MOUTH DAILY AT BEDTIME, Disp: 90 tablet, Rfl: 1   solifenacin (VESICARE) 10 MG tablet, Take 1 tablet (10 mg total) by mouth daily., Disp: 90 tablet, Rfl: 1   Allergies  Allergen Reactions   Celexa  [Citalopram Hydrobromide] Other (See Comments)   Depakote  [Divalproex Sodium]  Other (See Comments)   Methocarbamol     Other reaction(s): Hallucinations   Paroxetine Hcl     Other reaction(s): Insomnia   Smz-Tmp Ds [Sulfamethoxazole-Trimethoprim] Nausea And Vomiting   Other     "Symbrax" alteration in blood sugar, pt unsure if low or high blood sugar   Oxycodone-Acetaminophen     Other reaction(s): Unknown   Percocet [Oxycodone-Acetaminophen] Other (See Comments)    Hyperactivity   Risperidone Other (See Comments)    REACTION: hyper    Past Medical History:  Diagnosis Date   Allergy    allergy shots Dr. Velora Heckler   Anemia    Anxiety    Asthma    moderate persistant   Bipolar affective (HCC)    Cataract    both eyes   COPD (chronic obstructive pulmonary disease) (HCC)    Depression    Diabetes mellitus    DJD (degenerative joint disease)    Dysphagia    Gallstones    hx of, s/p cholecystectomy   GERD (gastroesophageal reflux disease)    Hepatitis A    as teenager 60's   Hollenhorst plaque    right eye   Hyperlipidemia    Hypertension    Kidney stones    hx of   Meniere's disease    Motility disorder, esophageal    Obesity    Pancreatitis    Personal history of colonic polyps 11/2004   hyperplastic.   Stroke [facility])    per MRI     Past Surgical History:  Procedure Laterality Date   CATARACT EXTRACTION Bilateral 09/2015 and 10/2015   CHOLECYSTECTOMY     COLONOSCOPY     TOTAL KNEE ARTHROPLASTY Bilateral    both knees    Family History  Problem Relation Age of Onset   Diabetes Father    Heart disease Brother    Heart disease Maternal Grandmother    Cancer Sister        unknown type   Colon cancer Neg Hx    Prostate cancer Neg Hx    Colon polyps Neg Hx    Esophageal cancer Neg Hx    Rectal cancer Neg Hx    Stomach cancer Neg Hx     Social History   Tobacco Use   Smoking status: Never   Smokeless tobacco: Never  Substance Use Topics   Alcohol use: Not Currently   Drug use: No    ROS   Objective:   Vitals: BP (!)  144/82   Pulse 77   Temp (!) 97.5 F (36.4 C)   Resp 18   SpO2 91%   Physical Exam Constitutional:      General: He is not in acute distress.    Appearance: Normal appearance. He is well-developed and normal weight. He is not ill-appearing, toxic-appearing or diaphoretic.  HENT:     Head: Normocephalic  and atraumatic.     Right Ear: External ear normal.     Left Ear: External ear normal.     Nose: Nose normal.     Mouth/Throat:     Pharynx: Oropharynx is clear.  Eyes:     General: No scleral icterus.       Right eye: No discharge.        Left eye: No discharge.     Extraocular Movements: Extraocular movements intact.  Cardiovascular:     Rate and Rhythm: Normal rate.  Pulmonary:     Effort: Pulmonary effort is normal.  Musculoskeletal:     Left wrist: Swelling (1+ throughout), deformity, tenderness, bony tenderness and snuff box tenderness present. No effusion, lacerations or crepitus. Decreased range of motion.     Cervical back: Normal range of motion.  Neurological:     Mental Status: He is alert and oriented to person, place, and time.  Psychiatric:        Mood and Affect: Mood normal.        Behavior: Behavior normal.        Thought Content: Thought content normal.        Judgment: Judgment normal.     DG Wrist Complete Left  Result Date: 07/18/2022 CLINICAL DATA:  Injury.  Left wrist pain. EXAM: LEFT WRIST - COMPLETE 3+ VIEW COMPARISON:  None Available. FINDINGS: Nondisplaced, non comminuted fracture of the radial styloid. No other fractures.  No bone lesions. Osteoarthritic changes at the trapezium first metacarpal articulation. Remaining joints normally spaced and aligned. Soft tissue swelling which predominates over the radial wrist. IMPRESSION: 1. Nondisplaced, non comminuted fracture of the radial styloid. Electronically Signed   By: Lajean Manes M.D.   On: 07/18/2022 14:01    Patient placed into a sugar-tong splint for his wrist fracture extending to the MCP of  the left hand.  Patient given Tylenol in clinic for his severe pain.  Assessment and Plan :   I have reviewed the PDMP during this encounter.  1. Nondisplaced fracture of left radial styloid process, initial encounter for closed fracture   2. Acute pain of left wrist    Above measures taken for the urgent management of his left radial fracture.  Recommended follow-up with his orthopedist at [facility].  Use Tylenol for regular pain, breakthrough pain can be managed through hydrocodone.  We did provide patient with a shoulder sling and this is primarily to help him bear the weight of the wrist splint.  Discussed appropriate use of the splint and shoulder sling. Counseled patient on potential for adverse effects with medications prescribed/recommended today, ER and return-to-clinic precautions discussed, patient verbalized understanding.    Jaynee Eagles, PA-C 07/18/22 1513

## 2022-07-18 NOTE — Discharge Instructions (Addendum)
Please schedule Tylenol at 500 mg - 650 mg once every 6 hours as needed for aches and pains.  If you still have pain despite taking Tylenol regularly, this is breakthrough pain.  You can use hydrocodone once every 6 hours for this.  Once your pain is better controlled, switch back to just Tylenol.  Follow up with your orthopedist as soon as possible. Wear splint at all times otherwise. Use the shoulder sling to support your wrist but not to avoid moving shoulder.

## 2022-07-21 DIAGNOSIS — J3089 Other allergic rhinitis: Secondary | ICD-10-CM | POA: Diagnosis not present

## 2022-07-21 DIAGNOSIS — J3081 Allergic rhinitis due to animal (cat) (dog) hair and dander: Secondary | ICD-10-CM | POA: Diagnosis not present

## 2022-07-21 DIAGNOSIS — J301 Allergic rhinitis due to pollen: Secondary | ICD-10-CM | POA: Diagnosis not present

## 2022-07-28 ENCOUNTER — Ambulatory Visit: Payer: Medicare Other | Admitting: Internal Medicine

## 2022-07-28 DIAGNOSIS — S52512A Displaced fracture of left radial styloid process, initial encounter for closed fracture: Secondary | ICD-10-CM | POA: Diagnosis not present

## 2022-07-28 DIAGNOSIS — J3081 Allergic rhinitis due to animal (cat) (dog) hair and dander: Secondary | ICD-10-CM | POA: Diagnosis not present

## 2022-07-28 DIAGNOSIS — J3089 Other allergic rhinitis: Secondary | ICD-10-CM | POA: Diagnosis not present

## 2022-07-28 DIAGNOSIS — J301 Allergic rhinitis due to pollen: Secondary | ICD-10-CM | POA: Diagnosis not present

## 2022-07-29 DIAGNOSIS — J3089 Other allergic rhinitis: Secondary | ICD-10-CM | POA: Diagnosis not present

## 2022-07-29 DIAGNOSIS — J301 Allergic rhinitis due to pollen: Secondary | ICD-10-CM | POA: Diagnosis not present

## 2022-08-04 DIAGNOSIS — J301 Allergic rhinitis due to pollen: Secondary | ICD-10-CM | POA: Diagnosis not present

## 2022-08-04 DIAGNOSIS — J3089 Other allergic rhinitis: Secondary | ICD-10-CM | POA: Diagnosis not present

## 2022-08-09 ENCOUNTER — Ambulatory Visit: Payer: Medicare Other | Admitting: Internal Medicine

## 2022-08-11 DIAGNOSIS — S52512D Displaced fracture of left radial styloid process, subsequent encounter for closed fracture with routine healing: Secondary | ICD-10-CM | POA: Diagnosis not present

## 2022-08-11 DIAGNOSIS — J301 Allergic rhinitis due to pollen: Secondary | ICD-10-CM | POA: Diagnosis not present

## 2022-08-11 DIAGNOSIS — J3081 Allergic rhinitis due to animal (cat) (dog) hair and dander: Secondary | ICD-10-CM | POA: Diagnosis not present

## 2022-08-11 DIAGNOSIS — J3089 Other allergic rhinitis: Secondary | ICD-10-CM | POA: Diagnosis not present

## 2022-08-14 ENCOUNTER — Other Ambulatory Visit: Payer: Self-pay | Admitting: Internal Medicine

## 2022-08-16 ENCOUNTER — Encounter: Payer: Self-pay | Admitting: Internal Medicine

## 2022-08-16 ENCOUNTER — Ambulatory Visit (INDEPENDENT_AMBULATORY_CARE_PROVIDER_SITE_OTHER): Payer: Medicare Other | Admitting: Internal Medicine

## 2022-08-16 VITALS — BP 138/70 | HR 92 | Temp 98.0°F | Resp 18 | Ht 70.0 in | Wt 233.0 lb

## 2022-08-16 DIAGNOSIS — E1142 Type 2 diabetes mellitus with diabetic polyneuropathy: Secondary | ICD-10-CM | POA: Diagnosis not present

## 2022-08-16 DIAGNOSIS — Z23 Encounter for immunization: Secondary | ICD-10-CM | POA: Diagnosis not present

## 2022-08-16 DIAGNOSIS — E875 Hyperkalemia: Secondary | ICD-10-CM | POA: Diagnosis not present

## 2022-08-16 DIAGNOSIS — B369 Superficial mycosis, unspecified: Secondary | ICD-10-CM

## 2022-08-16 DIAGNOSIS — D649 Anemia, unspecified: Secondary | ICD-10-CM | POA: Diagnosis not present

## 2022-08-16 DIAGNOSIS — F3178 Bipolar disorder, in full remission, most recent episode mixed: Secondary | ICD-10-CM

## 2022-08-16 DIAGNOSIS — Z7185 Encounter for immunization safety counseling: Secondary | ICD-10-CM | POA: Diagnosis not present

## 2022-08-16 DIAGNOSIS — I10 Essential (primary) hypertension: Secondary | ICD-10-CM

## 2022-08-16 LAB — BASIC METABOLIC PANEL
BUN: 31 mg/dL — ABNORMAL HIGH (ref 6–23)
CO2: 26 mEq/L (ref 19–32)
Calcium: 8.9 mg/dL (ref 8.4–10.5)
Chloride: 102 mEq/L (ref 96–112)
Creatinine, Ser: 1 mg/dL (ref 0.40–1.50)
GFR: 74.3 mL/min (ref >60.00)
Glucose, Bld: 172 mg/dL — ABNORMAL HIGH (ref 70–99)
Potassium: 5.5 mEq/L — ABNORMAL HIGH (ref 3.5–5.1)
Sodium: 139 mEq/L (ref 135–145)

## 2022-08-16 LAB — CBC WITH DIFFERENTIAL/PLATELET
Basophils Absolute: 0.1 10*3/uL (ref 0.0–0.1)
Basophils Relative: 0.6 % (ref 0.0–3.0)
Eosinophils Absolute: 0.3 10*3/uL (ref 0.0–0.7)
Eosinophils Relative: 3.2 % (ref 0.0–5.0)
HCT: 34.5 % — ABNORMAL LOW (ref 39.0–52.0)
Hemoglobin: 11.2 g/dL — ABNORMAL LOW (ref 13.0–17.0)
Lymphocytes Relative: 24.6 % (ref 12.0–46.0)
Lymphs Abs: 2.5 10*3/uL (ref 0.7–4.0)
MCHC: 32.4 g/dL (ref 30.0–36.0)
MCV: 82 fl (ref 78.0–100.0)
Monocytes Absolute: 0.7 10*3/uL (ref 0.1–1.0)
Monocytes Relative: 7.1 % (ref 3.0–12.0)
Neutro Abs: 6.6 10*3/uL (ref 1.4–7.7)
Neutrophils Relative %: 64.5 % (ref 43.0–77.0)
Platelets: 367 10*3/uL (ref 150.0–400.0)
RBC: 4.21 Mil/uL — ABNORMAL LOW (ref 4.22–5.81)
RDW: 17.4 % — ABNORMAL HIGH (ref 11.5–15.5)
WBC: 10.3 10*3/uL (ref 4.0–10.5)

## 2022-08-16 LAB — HEMOGLOBIN A1C: Hgb A1c MFr Bld: 7 % — ABNORMAL HIGH (ref 4.6–6.5)

## 2022-08-16 NOTE — Assessment & Plan Note (Signed)
DM: On Actos plus met, no recent ambulatory CBGs, check A1c. HTN: Infrequent ambulatory BPs, it was checked last week: 126/78.  Continue losartan, check BMP. Bipolar: Seems under excellent control on current meds DJD: We talk about pain, hydrocodone is prescribed elsewhere, he also takes Tylenol as needed.  I encouraged to use Voltaren topical to the base of the thumb where he has DJD pain.  S/p  physical therapy and it helped a lot. Balanoposthitis: Patient has diabetes and on/off  dermatomycosis at the foreskin and groins.  Uses Nystatin  ointment and powder as needed.  Will RF PRN (prefers nystatin cream for next RF) Preventive care: We talk about the benefits of a COVID-vaccine, RSV.  Had a flu shot today. RTC CPX 4 months

## 2022-08-16 NOTE — Progress Notes (Signed)
Subjective:    Patient ID: Ryan Zhang, male    DOB: 06-03-1946, 76 y.o.   MRN: [id number]  DOS:  08/16/2022 Type of visit - description: Routine checkup  Here with his wife We addressed his chronic medical problems. Main concern at the present time is MSK: Had a fracture of the left wrist, treated nonsurgically.  + DJD, multiple aches and pains.   Review of Systems See above   Past Medical History:  Diagnosis Date   Allergy    allergy shots Dr. Velora Heckler   Anemia    Anxiety    Asthma    moderate persistant   Bipolar affective (HCC)    Cataract    both eyes   COPD (chronic obstructive pulmonary disease) (HCC)    Depression    Diabetes mellitus    DJD (degenerative joint disease)    Dysphagia    Gallstones    hx of, s/p cholecystectomy   GERD (gastroesophageal reflux disease)    Hepatitis A    as teenager 60's   Hollenhorst plaque    right eye   Hyperlipidemia    Hypertension    Kidney stones    hx of   Meniere's disease    Motility disorder, esophageal    Obesity    Pancreatitis    Personal history of colonic polyps 11/2004   hyperplastic.   Stroke [facility])    per MRI    Past Surgical History:  Procedure Laterality Date   CATARACT EXTRACTION Bilateral 09/2015 and 10/2015   CHOLECYSTECTOMY     COLONOSCOPY     TOTAL KNEE ARTHROPLASTY Bilateral    both knees    Current Outpatient Medications  Medication Instructions   acetaminophen (TYLENOL) 650 mg, Oral, Every 6 hours PRN   albuterol (PROVENTIL) 2.5 mg, Nebulization, Every 6 hours PRN   albuterol (VENTOLIN HFA) 108 (90 Base) MCG/ACT inhaler 2 puffs, Inhalation, Every 6 hours PRN   augmented betamethasone dipropionate (DIPROLENE-AF) 0.05 % cream Topical, 2 times daily   azelastine (ASTELIN) 0.1 % nasal spray 1 spray, As needed   budesonide-formoterol (SYMBICORT) 160-4.5 MCG/ACT inhaler 2 puffs, Inhalation, 2 times daily   buPROPion (WELLBUTRIN XL) 150 mg, Oral, Daily   clonazePAM (KLONOPIN) 0.5 mg,  Oral, See admin instructions, #2 at 8am, #1 at 2pm ([name])   clopidogrel (PLAVIX) 75 MG tablet TAKE ONE TABLET BY MOUTH ONE TIME DAILY   DULoxetine (CYMBALTA) 60 mg, Oral, BH-each morning   EPIPEN 2-PAK 0.3 MG/0.3ML SOAJ injection Reported on 04/29/2016   esomeprazole (NEXIUM) 40 mg, Oral, BH-each morning   fexofenadine (ALLEGRA) 180 mg, Oral, Every morning, 8am ([place])   fluticasone (VERAMYST) 27.5 MCG/SPRAY nasal spray 2 sprays, Nasal, Daily PRN   HYDROcodone-acetaminophen (NORCO/VICODIN) 5-325 MG tablet 1 tablet, Oral, Every 6 hours PRN   hydrocortisone 2.5 % cream Topical, 2 times daily   lamoTRIgine (LAMICTAL) 50 mg, Oral, Daily at bedtime   Lancets (ONETOUCH ULTRASOFT) lancets Check blood sugars no more than twice daily   losartan (COZAAR) 25 mg, Oral, Every evening   montelukast (SINGULAIR) 10 mg, Oral, Daily at bedtime, [name]    Multiple Vitamin (MULTIVITAMIN) tablet 1 tablet, Oral, Daily   nystatin (MYCOSTATIN/NYSTOP) powder 1 application , Topical, 3 times daily   nystatin ointment (MYCOSTATIN) 1 application , Topical, 2 times daily PRN   ONETOUCH VERIO test strip use to check blood sugar no more than twice daily   pioglitazone-metformin (ACTOPLUS MET) 15-850 MG tablet 1 tablet, Oral, 2 times daily  with meals   simvastatin (ZOCOR) 40 mg, Oral, Daily at bedtime   solifenacin (VESICARE) 10 mg, Oral, Daily       Objective:   Physical Exam BP 138/70   Pulse 92   Temp 98 F (36.7 C) (Oral)   Resp 18   Ht _0  (1.778 m)   Wt 233 lb (105.7 kg)   SpO2 94%   BMI 33.43 kg/m  General:   Well developed, NAD, BMI noted. HEENT:  Normocephalic . Face symmetric, atraumatic Lungs:  CTA B Normal respiratory effort, no intercostal retractions, no accessory muscle use. Heart: RRR,  no murmur. Upper extremities: Has a brace at the left wrist. Lower extremities: no pretibial edema bilaterally  Skin: Not pale. Not jaundice Neurologic:  alert & oriented X3.  Speech  normal, gait appropriate for age and unassisted Psych--  Cognition and judgment appear intact.  Cooperative with normal attention span and concentration.  Behavior appropriate. No anxious or depressed appearing.      Assessment     Assessment DM, mild neuropathy HTN Hyperlipidemia Morbid obesity (BMI 39 plus DM) Bipolar, Depression, cognitive issues after ECT 2007 (on disability) per   psychiatry, Dr Casimiro Needle DJD-- hydrocodone rarely rx by [place] ortho Asthma-Allergies ------------ Dr [name]  GERD, h/o dysphagia (chronic, neurogenic-transfer dysphagia? See GI note 12-2011) Neuro: --? stroke : saw neuro 2013 d/t B transient visual loss, ASA changed to plavix.  --See CTA, MRIs of head-neck report  --Saw neuro 11-2013: fall, syncope,parkinsonism d/t sapharis? CV: Enlarged ascending Ao  --per CT 12-2015,CT chest 08-2016 stable.    --MRI chest 10/2017 stable, no further routine x-rays recommended --CT chest 07/2018: Atherosclerotic changes in the thoracic aorta.  Nl LE arterial dopplers 2015 Carotid artery disease: Per [place]  2011, last [place] 2018: <50% B,   08/2019 < 1-39% B, 09-2021 < 39%, stable consider recheck in 2 years  CT chest  ---RML  75m: 07-2014, CT 12-2015, CT 08-2016, MRI chest 12,2018, CT chest 07/2018:Stable  HOH: L deaf L, R  hearing aid H/o Mnire's disease  Iron deficiency anemia: Noted 01/2018, + Hemoccult, 09-2018: Colonoscopy and EGD were completely normal  Urology- on Vesicare, sees urology  PLAN: DM: On Actos plus met, no recent ambulatory CBGs, check A1c. HTN: Infrequent ambulatory BPs, it was checked last week: 126/78.  Continue losartan, check BMP. Bipolar: Seems under excellent control on current meds DJD: We talk about pain, hydrocodone is prescribed elsewhere, he also takes Tylenol as needed.  I encouraged to use Voltaren topical to the base of the thumb where he has DJD pain.  S/p  physical therapy and it helped a lot. Balanoposthitis: Patient has diabetes and  on/off  dermatomycosis at the foreskin and groins.  Uses Nystatin  ointment and powder as needed.  Will RF PRN (prefers nystatin cream for next RF) Preventive care: We talk about the benefits of a COVID-vaccine, RSV.  Had a flu shot today. RTC CPX 4 months

## 2022-08-16 NOTE — Patient Instructions (Addendum)
Vaccines I recommend:  Covid booster RSV vaccine  Check the  blood pressure regularly BP GOAL is between 110/65 and  135/85. If it is consistently higher or lower, let me know   GO TO THE LAB : Get the blood work     [place], [place] back for a physical exam in 4 months

## 2022-08-17 NOTE — Addendum Note (Signed)
Addended byDamita Dunnings D on: 08/17/2022 09:34 AM   Modules accepted: Orders

## 2022-08-20 ENCOUNTER — Encounter: Payer: Self-pay | Admitting: Internal Medicine

## 2022-08-21 ENCOUNTER — Other Ambulatory Visit: Payer: Self-pay | Admitting: Internal Medicine

## 2022-08-25 DIAGNOSIS — J3081 Allergic rhinitis due to animal (cat) (dog) hair and dander: Secondary | ICD-10-CM | POA: Diagnosis not present

## 2022-08-25 DIAGNOSIS — J301 Allergic rhinitis due to pollen: Secondary | ICD-10-CM | POA: Diagnosis not present

## 2022-08-25 DIAGNOSIS — J3089 Other allergic rhinitis: Secondary | ICD-10-CM | POA: Diagnosis not present

## 2022-08-26 ENCOUNTER — Other Ambulatory Visit (INDEPENDENT_AMBULATORY_CARE_PROVIDER_SITE_OTHER): Payer: Medicare Other

## 2022-08-26 DIAGNOSIS — E875 Hyperkalemia: Secondary | ICD-10-CM

## 2022-08-27 LAB — BASIC METABOLIC PANEL
BUN: 27 mg/dL — ABNORMAL HIGH (ref 6–23)
CO2: 23 mEq/L (ref 19–32)
Calcium: 8.9 mg/dL (ref 8.4–10.5)
Chloride: 104 mEq/L (ref 96–112)
Creatinine, Ser: 1.01 mg/dL (ref 0.40–1.50)
GFR: 73.4 mL/min (ref >60.00)
Glucose, Bld: 147 mg/dL — ABNORMAL HIGH (ref 70–99)
Potassium: 4.6 mEq/L (ref 3.5–5.1)
Sodium: 139 mEq/L (ref 135–145)

## 2022-08-31 ENCOUNTER — Encounter: Payer: Self-pay | Admitting: Internal Medicine

## 2022-08-31 DIAGNOSIS — M5451 Vertebrogenic low back pain: Secondary | ICD-10-CM | POA: Diagnosis not present

## 2022-09-01 DIAGNOSIS — S52512D Displaced fracture of left radial styloid process, subsequent encounter for closed fracture with routine healing: Secondary | ICD-10-CM | POA: Diagnosis not present

## 2022-09-01 DIAGNOSIS — J301 Allergic rhinitis due to pollen: Secondary | ICD-10-CM | POA: Diagnosis not present

## 2022-09-01 DIAGNOSIS — J3089 Other allergic rhinitis: Secondary | ICD-10-CM | POA: Diagnosis not present

## 2022-09-01 DIAGNOSIS — J3081 Allergic rhinitis due to animal (cat) (dog) hair and dander: Secondary | ICD-10-CM | POA: Diagnosis not present

## 2022-09-02 DIAGNOSIS — H35033 Hypertensive retinopathy, bilateral: Secondary | ICD-10-CM | POA: Diagnosis not present

## 2022-09-02 DIAGNOSIS — H35371 Puckering of macula, right eye: Secondary | ICD-10-CM | POA: Diagnosis not present

## 2022-09-02 DIAGNOSIS — Z961 Presence of intraocular lens: Secondary | ICD-10-CM | POA: Diagnosis not present

## 2022-09-02 DIAGNOSIS — E1165 Type 2 diabetes mellitus with hyperglycemia: Secondary | ICD-10-CM | POA: Diagnosis not present

## 2022-09-02 LAB — HM DIABETES EYE EXAM

## 2022-09-06 DIAGNOSIS — M5451 Vertebrogenic low back pain: Secondary | ICD-10-CM | POA: Diagnosis not present

## 2022-09-06 DIAGNOSIS — Z723 Lack of physical exercise: Secondary | ICD-10-CM | POA: Diagnosis not present

## 2022-09-06 DIAGNOSIS — M6281 Muscle weakness (generalized): Secondary | ICD-10-CM | POA: Diagnosis not present

## 2022-09-06 DIAGNOSIS — Z9181 History of falling: Secondary | ICD-10-CM | POA: Diagnosis not present

## 2022-09-07 DIAGNOSIS — Z9181 History of falling: Secondary | ICD-10-CM | POA: Diagnosis not present

## 2022-09-07 DIAGNOSIS — Z723 Lack of physical exercise: Secondary | ICD-10-CM | POA: Diagnosis not present

## 2022-09-07 DIAGNOSIS — M6281 Muscle weakness (generalized): Secondary | ICD-10-CM | POA: Diagnosis not present

## 2022-09-07 DIAGNOSIS — M5451 Vertebrogenic low back pain: Secondary | ICD-10-CM | POA: Diagnosis not present

## 2022-09-08 DIAGNOSIS — J301 Allergic rhinitis due to pollen: Secondary | ICD-10-CM | POA: Diagnosis not present

## 2022-09-08 DIAGNOSIS — J3089 Other allergic rhinitis: Secondary | ICD-10-CM | POA: Diagnosis not present

## 2022-09-08 DIAGNOSIS — J3081 Allergic rhinitis due to animal (cat) (dog) hair and dander: Secondary | ICD-10-CM | POA: Diagnosis not present

## 2022-09-14 DIAGNOSIS — M5451 Vertebrogenic low back pain: Secondary | ICD-10-CM | POA: Diagnosis not present

## 2022-09-14 DIAGNOSIS — Z9181 History of falling: Secondary | ICD-10-CM | POA: Diagnosis not present

## 2022-09-14 DIAGNOSIS — M6281 Muscle weakness (generalized): Secondary | ICD-10-CM | POA: Diagnosis not present

## 2022-09-14 DIAGNOSIS — Z723 Lack of physical exercise: Secondary | ICD-10-CM | POA: Diagnosis not present

## 2022-09-15 DIAGNOSIS — J3089 Other allergic rhinitis: Secondary | ICD-10-CM | POA: Diagnosis not present

## 2022-09-15 DIAGNOSIS — J3081 Allergic rhinitis due to animal (cat) (dog) hair and dander: Secondary | ICD-10-CM | POA: Diagnosis not present

## 2022-09-15 DIAGNOSIS — J301 Allergic rhinitis due to pollen: Secondary | ICD-10-CM | POA: Diagnosis not present

## 2022-09-16 DIAGNOSIS — M5451 Vertebrogenic low back pain: Secondary | ICD-10-CM | POA: Diagnosis not present

## 2022-09-16 DIAGNOSIS — Z723 Lack of physical exercise: Secondary | ICD-10-CM | POA: Diagnosis not present

## 2022-09-16 DIAGNOSIS — Z9181 History of falling: Secondary | ICD-10-CM | POA: Diagnosis not present

## 2022-09-16 DIAGNOSIS — M6281 Muscle weakness (generalized): Secondary | ICD-10-CM | POA: Diagnosis not present

## 2022-09-20 DIAGNOSIS — M6281 Muscle weakness (generalized): Secondary | ICD-10-CM | POA: Diagnosis not present

## 2022-09-20 DIAGNOSIS — M5451 Vertebrogenic low back pain: Secondary | ICD-10-CM | POA: Diagnosis not present

## 2022-09-20 DIAGNOSIS — Z9181 History of falling: Secondary | ICD-10-CM | POA: Diagnosis not present

## 2022-09-20 DIAGNOSIS — Z723 Lack of physical exercise: Secondary | ICD-10-CM | POA: Diagnosis not present

## 2022-09-21 DIAGNOSIS — M79645 Pain in left finger(s): Secondary | ICD-10-CM | POA: Diagnosis not present

## 2022-09-22 DIAGNOSIS — J301 Allergic rhinitis due to pollen: Secondary | ICD-10-CM | POA: Diagnosis not present

## 2022-09-22 DIAGNOSIS — J3081 Allergic rhinitis due to animal (cat) (dog) hair and dander: Secondary | ICD-10-CM | POA: Diagnosis not present

## 2022-09-22 DIAGNOSIS — J3089 Other allergic rhinitis: Secondary | ICD-10-CM | POA: Diagnosis not present

## 2022-09-24 DIAGNOSIS — M5451 Vertebrogenic low back pain: Secondary | ICD-10-CM | POA: Diagnosis not present

## 2022-09-24 DIAGNOSIS — Z723 Lack of physical exercise: Secondary | ICD-10-CM | POA: Diagnosis not present

## 2022-09-24 DIAGNOSIS — Z9181 History of falling: Secondary | ICD-10-CM | POA: Diagnosis not present

## 2022-09-24 DIAGNOSIS — M6281 Muscle weakness (generalized): Secondary | ICD-10-CM | POA: Diagnosis not present

## 2022-09-27 DIAGNOSIS — M5451 Vertebrogenic low back pain: Secondary | ICD-10-CM | POA: Diagnosis not present

## 2022-09-27 DIAGNOSIS — M6281 Muscle weakness (generalized): Secondary | ICD-10-CM | POA: Diagnosis not present

## 2022-09-27 DIAGNOSIS — Z9181 History of falling: Secondary | ICD-10-CM | POA: Diagnosis not present

## 2022-09-27 DIAGNOSIS — Z723 Lack of physical exercise: Secondary | ICD-10-CM | POA: Diagnosis not present

## 2022-09-29 DIAGNOSIS — J301 Allergic rhinitis due to pollen: Secondary | ICD-10-CM | POA: Diagnosis not present

## 2022-09-29 DIAGNOSIS — S52512D Displaced fracture of left radial styloid process, subsequent encounter for closed fracture with routine healing: Secondary | ICD-10-CM | POA: Diagnosis not present

## 2022-09-29 DIAGNOSIS — M25521 Pain in right elbow: Secondary | ICD-10-CM | POA: Diagnosis not present

## 2022-09-29 DIAGNOSIS — J3089 Other allergic rhinitis: Secondary | ICD-10-CM | POA: Diagnosis not present

## 2022-09-30 ENCOUNTER — Encounter: Payer: Self-pay | Admitting: Internal Medicine

## 2022-09-30 DIAGNOSIS — M5451 Vertebrogenic low back pain: Secondary | ICD-10-CM | POA: Diagnosis not present

## 2022-09-30 DIAGNOSIS — Z9181 History of falling: Secondary | ICD-10-CM | POA: Diagnosis not present

## 2022-09-30 DIAGNOSIS — M6281 Muscle weakness (generalized): Secondary | ICD-10-CM | POA: Diagnosis not present

## 2022-09-30 DIAGNOSIS — Z723 Lack of physical exercise: Secondary | ICD-10-CM | POA: Diagnosis not present

## 2022-10-04 DIAGNOSIS — Z9181 History of falling: Secondary | ICD-10-CM | POA: Diagnosis not present

## 2022-10-04 DIAGNOSIS — Z723 Lack of physical exercise: Secondary | ICD-10-CM | POA: Diagnosis not present

## 2022-10-04 DIAGNOSIS — M5451 Vertebrogenic low back pain: Secondary | ICD-10-CM | POA: Diagnosis not present

## 2022-10-04 DIAGNOSIS — M6281 Muscle weakness (generalized): Secondary | ICD-10-CM | POA: Diagnosis not present

## 2022-10-05 ENCOUNTER — Encounter: Payer: Self-pay | Admitting: Internal Medicine

## 2022-10-05 ENCOUNTER — Ambulatory Visit (INDEPENDENT_AMBULATORY_CARE_PROVIDER_SITE_OTHER): Payer: Medicare Other | Admitting: Internal Medicine

## 2022-10-05 VITALS — BP 116/78 | HR 60 | Temp 98.0°F | Resp 18 | Ht 70.0 in | Wt 232.2 lb

## 2022-10-05 DIAGNOSIS — F3178 Bipolar disorder, in full remission, most recent episode mixed: Secondary | ICD-10-CM

## 2022-10-05 DIAGNOSIS — M1991 Primary osteoarthritis, unspecified site: Secondary | ICD-10-CM | POA: Diagnosis not present

## 2022-10-05 DIAGNOSIS — G4700 Insomnia, unspecified: Secondary | ICD-10-CM

## 2022-10-05 NOTE — Progress Notes (Unsigned)
Subjective:    Patient ID: Ryan Zhang, male    DOB: 1946-06-04, 76 y.o.   MRN: [id number]  DOS:  10/05/2022 Type of visit - description: Acute  The patient is here with his wife, I ask him to summarize the recent messages he sent. He has several concerns. To have a left thumb joint replacement 11/01/2022.  Wonders how to stop Plavix. He is still for insomnia, this is going on for a couple of months.  In the past he tried Ambien.  Left wrist injury is getting better Back pain is getting better after physical therapy Also was recommended possibly a rheumatology referral to rule out RA per his hand doctor.   Review of Systems See above   Past Medical History:  Diagnosis Date   Allergy    allergy shots Dr. Velora Heckler   Anemia    Anxiety    Asthma    moderate persistant   Bipolar affective (HCC)    Cataract    both eyes   COPD (chronic obstructive pulmonary disease) (HCC)    Depression    Diabetes mellitus    DJD (degenerative joint disease)    Dysphagia    Gallstones    hx of, s/p cholecystectomy   GERD (gastroesophageal reflux disease)    Hepatitis A    as teenager 60's   Hollenhorst plaque    right eye   Hyperlipidemia    Hypertension    Kidney stones    hx of   Meniere's disease    Motility disorder, esophageal    Obesity    Pancreatitis    Personal history of colonic polyps 11/2004   hyperplastic.   Stroke [facility])    per MRI    Past Surgical History:  Procedure Laterality Date   CATARACT EXTRACTION Bilateral 09/2015 and 10/2015   CHOLECYSTECTOMY     COLONOSCOPY     TOTAL KNEE ARTHROPLASTY Bilateral    both knees    Current Outpatient Medications  Medication Instructions   acetaminophen (TYLENOL) 650 mg, Oral, Every 6 hours PRN   albuterol (PROVENTIL) 2.5 mg, Nebulization, Every 6 hours PRN   albuterol (VENTOLIN HFA) 108 (90 Base) MCG/ACT inhaler 2 puffs, Inhalation, Every 6 hours PRN   augmented betamethasone dipropionate (DIPROLENE-AF) 0.05 %  cream Topical, 2 times daily   budesonide-formoterol (SYMBICORT) 160-4.5 MCG/ACT inhaler 2 puffs, Inhalation, 2 times daily   buPROPion (WELLBUTRIN XL) 150 mg, Oral, Daily   clonazePAM (KLONOPIN) 0.5 mg, Oral, See admin instructions, #2 at 8am, #1 at 2pm ([name])   clopidogrel (PLAVIX) 75 MG tablet TAKE ONE TABLET BY MOUTH ONE TIME DAILY   DULoxetine (CYMBALTA) 60 mg, Oral, BH-each morning   EPIPEN 2-PAK 0.3 MG/0.3ML SOAJ injection Reported on 04/29/2016   esomeprazole (NEXIUM) 40 mg, Oral, BH-each morning   fexofenadine (ALLEGRA) 180 mg, Oral, Every morning, 8am ([place])   fluticasone (VERAMYST) 27.5 MCG/SPRAY nasal spray 2 sprays, Nasal, Daily PRN   HYDROcodone-acetaminophen (NORCO/VICODIN) 5-325 MG tablet 1 tablet, Oral, Every 6 hours PRN   hydrocortisone 2.5 % cream Topical, 2 times daily   lamoTRIgine (LAMICTAL) 50 mg, Oral, Daily at bedtime   Lancets (ONETOUCH ULTRASOFT) lancets Check blood sugars no more than twice daily   losartan (COZAAR) 25 mg, Oral, Every evening   montelukast (SINGULAIR) 10 mg, Oral, Daily at bedtime, [name]    Multiple Vitamin (MULTIVITAMIN) tablet 1 tablet, Oral, Daily   nystatin (MYCOSTATIN/NYSTOP) powder 1 application , Topical, 3 times daily   nystatin ointment (  MYCOSTATIN) APPLY TOPICALLY TWICE A DAY AS NEEDED   ONETOUCH VERIO test strip use to check blood sugar no more than twice daily   pioglitazone-metformin (ACTOPLUS MET) 15-850 MG tablet 1 tablet, Oral, 2 times daily with meals   simvastatin (ZOCOR) 40 mg, Oral, Daily at bedtime   solifenacin (VESICARE) 10 mg, Oral, Daily       Objective:   Physical Exam BP 116/78   Pulse 60   Temp 98 F (36.7 C) (Oral)   Resp 18   Ht 5' 10" (1.778 m)   Wt 232 lb 4 oz (105.3 kg)   SpO2 91%   BMI 33.32 kg/m  General:   Well developed, NAD, BMI noted. HEENT:  Normocephalic . Face symmetric, atraumatic   Lower extremities: no pretibial edema bilaterally  Skin: Not pale. Not jaundice Neurologic:   alert & oriented X3.  Speech normal, gait appropriate for age and unassisted Psych--  Cognition and judgment appear intact.  Cooperative with normal attention span and concentration.  Behavior appropriate. No anxious or depressed appearing.      Assessment     Assessment DM, mild neuropathy HTN Hyperlipidemia Morbid obesity (BMI 39 plus DM) Bipolar, Depression, cognitive issues after ECT 2007 (on disability) per   psychiatry, Dr Casimiro Needle DJD-- hydrocodone rarely rx by [place] ortho Asthma-Allergies ------------ Dr [name]  GERD, h/o dysphagia (chronic, neurogenic-transfer dysphagia? See GI note 12-2011) Neuro: --? stroke : saw neuro 2013 d/t B transient visual loss, ASA changed to plavix.  --See CTA, MRIs of head-neck report  --Saw neuro 11-2013: fall, syncope,parkinsonism d/t sapharis? CV: Enlarged ascending Ao  --per CT 12-2015,CT chest 08-2016 stable.    --MRI chest 10/2017 stable, no further routine x-rays recommended --CT chest 07/2018: Atherosclerotic changes in the thoracic aorta.  Nl LE arterial dopplers 2015 Carotid artery disease: Per [place]  2011, last [place] 2018: <50% B,   08/2019 < 1-39% B, 09-2021 < 39%, stable consider recheck in 2 years  CT chest  ---RML  63m: 07-2014, CT 12-2015, CT 08-2016, MRI chest 12,2018, CT chest 07/2018:Stable  HOH: L deaf L, R  hearing aid H/o Mnire's disease  Iron deficiency anemia: Noted 01/2018, + Hemoccult, 09-2018: Colonoscopy and EGD were completely normal  Urology- on Vesicare, sees urology  PLAN: Insomnia: In the context of bipolar taking multiple medication, recommend to discuss with Dr. PCasimiro NeedleDJD: Chronic back pain: Much improved after physical therapy In need of arthroplasty of the left thumb, hold Plavix?  Chart reviewed  Rheumatoid arthritis?  The patient's orthopedic doctor raised the question of rheumatoid arthritis, while the patient admits to joint aches she has never have any redness/swelling/puffiness of the wrist or  fingers.  Offered a work-up or a referral however on clinical grounds I doubt he have RA.  We agreed on observation.  05/23/2012 1.  Spell of visual loss - I suspect this was likely related to transient hypoperfusion.  Perhaps his orthostasis contributed to this, but this seems less likely.  If he has another spell I would suggest that he have a Holter monitor.  I have said that he can drive six months after the last spell.  He will stay on Plavix despite I think this is low probability of being an ischemic event. 2.  Dysphagia/dysarthria - This will have to be monitored.  If it progresses he may need an EMG of his bulbar musculature, repetitive nerve stiulation as well as MUSK antibodies.  The changes in his pons are concerning suggesting a hot  cross bun sign that may represent changes seen in multiple system atrophy.  If he has progression of his symptoms I would as you to re-refer him to Dr. Carles Collet for consideration of this diagnosis. 3.  Gait instability - Perhaps related to a possible diagnosis of MSA.          DM: On Actos plus met, no recent ambulatory CBGs, check A1c. HTN: Infrequent ambulatory BPs, it was checked last week: 126/78.  Continue losartan, check BMP. Bipolar: Seems under excellent control on current meds DJD: We talk about pain, hydrocodone is prescribed elsewhere, he also takes Tylenol as needed.  I encouraged to use Voltaren topical to the base of the thumb where he has DJD pain.  S/p  physical therapy and it helped a lot. Balanoposthitis: Patient has diabetes and on/off  dermatomycosis at the foreskin and groins.  Uses Nystatin  ointment and powder as needed.  Will RF PRN (prefers nystatin cream for next RF) Preventive care: We talk about the benefits of a COVID-vaccine, RSV.  Had a flu shot today. RTC CPX 4 months

## 2022-10-06 DIAGNOSIS — J301 Allergic rhinitis due to pollen: Secondary | ICD-10-CM | POA: Diagnosis not present

## 2022-10-06 DIAGNOSIS — J3081 Allergic rhinitis due to animal (cat) (dog) hair and dander: Secondary | ICD-10-CM | POA: Diagnosis not present

## 2022-10-06 DIAGNOSIS — J3089 Other allergic rhinitis: Secondary | ICD-10-CM | POA: Diagnosis not present

## 2022-10-06 NOTE — Assessment & Plan Note (Addendum)
Insomnia: In the context of bipolar taking multiple medication, recommend to discuss with Dr. Donell Beers DJD: Chronic back pain: Much improved after physical therapy In need of arthroplasty of the left thumb, hold Plavix?  See next. ?Stroke: Wonders about holding Plavix, in the past there was a question of the stroke, last seen by neurology in 2015, since then no neurological issues.Last carotid ultrasound was 09/22/2021, 0 to 39% bilaterally and stable. Plan: Okay to stop Plavix for 5 to 7 days before surgery if requested by orthopedics.  Restart as soon as possible. Rheumatoid arthritis?  The patient's orthopedic doctor raised the question of rheumatoid arthritis, while the patient admits to joint aches she has never have any redness/swelling/puffiness of the wrist or fingers.  Offered a work-up or a referral however on clinical grounds I doubt he have RA.  We agreed on observation.

## 2022-10-08 DIAGNOSIS — M6281 Muscle weakness (generalized): Secondary | ICD-10-CM | POA: Diagnosis not present

## 2022-10-08 DIAGNOSIS — M5451 Vertebrogenic low back pain: Secondary | ICD-10-CM | POA: Diagnosis not present

## 2022-10-08 DIAGNOSIS — Z723 Lack of physical exercise: Secondary | ICD-10-CM | POA: Diagnosis not present

## 2022-10-08 DIAGNOSIS — Z9181 History of falling: Secondary | ICD-10-CM | POA: Diagnosis not present

## 2022-10-11 DIAGNOSIS — M6281 Muscle weakness (generalized): Secondary | ICD-10-CM | POA: Diagnosis not present

## 2022-10-11 DIAGNOSIS — Z723 Lack of physical exercise: Secondary | ICD-10-CM | POA: Diagnosis not present

## 2022-10-11 DIAGNOSIS — M5451 Vertebrogenic low back pain: Secondary | ICD-10-CM | POA: Diagnosis not present

## 2022-10-11 DIAGNOSIS — Z9181 History of falling: Secondary | ICD-10-CM | POA: Diagnosis not present

## 2022-10-13 DIAGNOSIS — J301 Allergic rhinitis due to pollen: Secondary | ICD-10-CM | POA: Diagnosis not present

## 2022-10-13 DIAGNOSIS — J3089 Other allergic rhinitis: Secondary | ICD-10-CM | POA: Diagnosis not present

## 2022-10-14 DIAGNOSIS — Z9181 History of falling: Secondary | ICD-10-CM | POA: Diagnosis not present

## 2022-10-14 DIAGNOSIS — M5451 Vertebrogenic low back pain: Secondary | ICD-10-CM | POA: Diagnosis not present

## 2022-10-14 DIAGNOSIS — Z723 Lack of physical exercise: Secondary | ICD-10-CM | POA: Diagnosis not present

## 2022-10-14 DIAGNOSIS — M6281 Muscle weakness (generalized): Secondary | ICD-10-CM | POA: Diagnosis not present

## 2022-10-19 NOTE — Progress Notes (Signed)
[facility] Quality Team Note  Name: AEON KESSNER Date of Birth: 1946/02/23 MRN: [id number] Date: 10/19/2022  [facility] Quality Team has reviewed this patient's chart, please see recommendations below:  [facility] Quality Other; (KED GAP- PATIENT HAS eGFR BUT NEEDS URINE MICROALBUMIN/CREATININE RATIO COMPLETED BEFORE 2024 TO CLOSE THE KED GAP FOR 2023)

## 2022-10-20 DIAGNOSIS — J3089 Other allergic rhinitis: Secondary | ICD-10-CM | POA: Diagnosis not present

## 2022-10-20 DIAGNOSIS — J3081 Allergic rhinitis due to animal (cat) (dog) hair and dander: Secondary | ICD-10-CM | POA: Diagnosis not present

## 2022-10-20 DIAGNOSIS — J301 Allergic rhinitis due to pollen: Secondary | ICD-10-CM | POA: Diagnosis not present

## 2022-10-21 ENCOUNTER — Other Ambulatory Visit: Payer: Self-pay | Admitting: Internal Medicine

## 2022-10-27 DIAGNOSIS — J3089 Other allergic rhinitis: Secondary | ICD-10-CM | POA: Diagnosis not present

## 2022-10-27 DIAGNOSIS — J301 Allergic rhinitis due to pollen: Secondary | ICD-10-CM | POA: Diagnosis not present

## 2022-11-01 DIAGNOSIS — M654 Radial styloid tenosynovitis [de Quervain]: Secondary | ICD-10-CM | POA: Diagnosis not present

## 2022-11-01 DIAGNOSIS — M19042 Primary osteoarthritis, left hand: Secondary | ICD-10-CM | POA: Diagnosis not present

## 2022-11-01 DIAGNOSIS — M1812 Unilateral primary osteoarthritis of first carpometacarpal joint, left hand: Secondary | ICD-10-CM | POA: Diagnosis not present

## 2022-11-10 DIAGNOSIS — J3081 Allergic rhinitis due to animal (cat) (dog) hair and dander: Secondary | ICD-10-CM | POA: Diagnosis not present

## 2022-11-10 DIAGNOSIS — J3089 Other allergic rhinitis: Secondary | ICD-10-CM | POA: Diagnosis not present

## 2022-11-10 DIAGNOSIS — J301 Allergic rhinitis due to pollen: Secondary | ICD-10-CM | POA: Diagnosis not present

## 2022-11-11 DIAGNOSIS — M79645 Pain in left finger(s): Secondary | ICD-10-CM | POA: Diagnosis not present

## 2022-11-11 DIAGNOSIS — M25642 Stiffness of left hand, not elsewhere classified: Secondary | ICD-10-CM | POA: Diagnosis not present

## 2022-11-13 ENCOUNTER — Other Ambulatory Visit: Payer: Self-pay | Admitting: Internal Medicine

## 2022-11-17 DIAGNOSIS — J3089 Other allergic rhinitis: Secondary | ICD-10-CM | POA: Diagnosis not present

## 2022-11-17 DIAGNOSIS — J301 Allergic rhinitis due to pollen: Secondary | ICD-10-CM | POA: Diagnosis not present

## 2022-11-17 DIAGNOSIS — J3081 Allergic rhinitis due to animal (cat) (dog) hair and dander: Secondary | ICD-10-CM | POA: Diagnosis not present

## 2022-11-24 DIAGNOSIS — J301 Allergic rhinitis due to pollen: Secondary | ICD-10-CM | POA: Diagnosis not present

## 2022-11-24 DIAGNOSIS — J3089 Other allergic rhinitis: Secondary | ICD-10-CM | POA: Diagnosis not present

## 2022-11-24 DIAGNOSIS — J3081 Allergic rhinitis due to animal (cat) (dog) hair and dander: Secondary | ICD-10-CM | POA: Diagnosis not present

## 2022-11-27 ENCOUNTER — Other Ambulatory Visit: Payer: Self-pay | Admitting: Internal Medicine

## 2022-12-01 DIAGNOSIS — S52512D Displaced fracture of left radial styloid process, subsequent encounter for closed fracture with routine healing: Secondary | ICD-10-CM | POA: Diagnosis not present

## 2022-12-01 DIAGNOSIS — M25521 Pain in right elbow: Secondary | ICD-10-CM | POA: Diagnosis not present

## 2022-12-01 DIAGNOSIS — J301 Allergic rhinitis due to pollen: Secondary | ICD-10-CM | POA: Diagnosis not present

## 2022-12-01 DIAGNOSIS — J3089 Other allergic rhinitis: Secondary | ICD-10-CM | POA: Diagnosis not present

## 2022-12-06 DIAGNOSIS — M79645 Pain in left finger(s): Secondary | ICD-10-CM | POA: Diagnosis not present

## 2022-12-06 DIAGNOSIS — M25642 Stiffness of left hand, not elsewhere classified: Secondary | ICD-10-CM | POA: Diagnosis not present

## 2022-12-06 DIAGNOSIS — R29898 Other symptoms and signs involving the musculoskeletal system: Secondary | ICD-10-CM | POA: Diagnosis not present

## 2022-12-08 DIAGNOSIS — J3089 Other allergic rhinitis: Secondary | ICD-10-CM | POA: Diagnosis not present

## 2022-12-08 DIAGNOSIS — J3081 Allergic rhinitis due to animal (cat) (dog) hair and dander: Secondary | ICD-10-CM | POA: Diagnosis not present

## 2022-12-08 DIAGNOSIS — J301 Allergic rhinitis due to pollen: Secondary | ICD-10-CM | POA: Diagnosis not present

## 2022-12-16 DIAGNOSIS — M79645 Pain in left finger(s): Secondary | ICD-10-CM | POA: Diagnosis not present

## 2022-12-17 DIAGNOSIS — J301 Allergic rhinitis due to pollen: Secondary | ICD-10-CM | POA: Diagnosis not present

## 2022-12-17 DIAGNOSIS — J3089 Other allergic rhinitis: Secondary | ICD-10-CM | POA: Diagnosis not present

## 2022-12-17 DIAGNOSIS — J3081 Allergic rhinitis due to animal (cat) (dog) hair and dander: Secondary | ICD-10-CM | POA: Diagnosis not present

## 2022-12-17 DIAGNOSIS — J300 Vasomotor rhinitis: Secondary | ICD-10-CM | POA: Diagnosis not present

## 2022-12-17 DIAGNOSIS — R1310 Dysphagia, unspecified: Secondary | ICD-10-CM | POA: Diagnosis not present

## 2022-12-17 DIAGNOSIS — J4540 Moderate persistent asthma, uncomplicated: Secondary | ICD-10-CM | POA: Diagnosis not present

## 2022-12-21 ENCOUNTER — Other Ambulatory Visit: Payer: Medicare Other

## 2022-12-21 ENCOUNTER — Encounter: Payer: Self-pay | Admitting: Internal Medicine

## 2022-12-21 ENCOUNTER — Ambulatory Visit (INDEPENDENT_AMBULATORY_CARE_PROVIDER_SITE_OTHER): Payer: Medicare Other | Admitting: Internal Medicine

## 2022-12-21 ENCOUNTER — Encounter: Payer: Self-pay | Admitting: Gastroenterology

## 2022-12-21 VITALS — BP 138/88 | HR 94 | Temp 98.0°F | Resp 18 | Ht 70.0 in | Wt 227.5 lb

## 2022-12-21 DIAGNOSIS — E1142 Type 2 diabetes mellitus with diabetic polyneuropathy: Secondary | ICD-10-CM | POA: Diagnosis not present

## 2022-12-21 DIAGNOSIS — E785 Hyperlipidemia, unspecified: Secondary | ICD-10-CM

## 2022-12-21 DIAGNOSIS — E1169 Type 2 diabetes mellitus with other specified complication: Secondary | ICD-10-CM | POA: Diagnosis not present

## 2022-12-21 LAB — HEMOGLOBIN A1C: Hgb A1c MFr Bld: 6.7 % — ABNORMAL HIGH (ref 4.6–6.5)

## 2022-12-21 LAB — AST: AST: 12 U/L (ref 0–37)

## 2022-12-21 LAB — LIPID PANEL
Cholesterol: 135 mg/dL (ref 0–200)
HDL: 52.3 mg/dL (ref >39.00)
LDL Cholesterol: 66 mg/dL (ref 0–99)
NonHDL: 82.56
Total CHOL/HDL Ratio: 3
Triglycerides: 83 mg/dL (ref 0.0–149.0)
VLDL: 16.6 mg/dL (ref 0.0–40.0)

## 2022-12-21 LAB — ALT: ALT: 9 U/L (ref 0–53)

## 2022-12-21 NOTE — Progress Notes (Signed)
Subjective:    Patient ID: Ryan Zhang, male    DOB: 1946-03-24, 77 y.o.   MRN: [id number]  DOS:  12/21/2022 Type of visit - description: Routine checkup  Doing well. Had L thumb  surgery, recuperating well. MSK issues have improved significantly, he is more active, has noticed some weight loss.  Wt Readings from Last 3 Encounters:  12/21/22 227 lb 8 oz (103.2 kg)  10/05/22 232 lb 4 oz (105.3 kg)  08/16/22 233 lb (105.7 kg)     Review of Systems See above   Past Medical History:  Diagnosis Date   Allergy    allergy shots Dr. Velora Heckler   Anemia    Anxiety    Asthma    moderate persistant   Bipolar affective (HCC)    Cataract    both eyes   COPD (chronic obstructive pulmonary disease) (HCC)    Depression    Diabetes mellitus    DJD (degenerative joint disease)    Dysphagia    Gallstones    hx of, s/p cholecystectomy   GERD (gastroesophageal reflux disease)    Hepatitis A    as teenager 60's   Hollenhorst plaque    right eye   Hyperlipidemia    Hypertension    Kidney stones    hx of   Meniere's disease    Motility disorder, esophageal    Obesity    Pancreatitis    Personal history of colonic polyps 11/2004   hyperplastic.   Stroke [facility])    per MRI    Past Surgical History:  Procedure Laterality Date   CATARACT EXTRACTION Bilateral 09/2015 and 10/2015   CHOLECYSTECTOMY     COLONOSCOPY     TOTAL KNEE ARTHROPLASTY Bilateral    both knees    Current Outpatient Medications  Medication Instructions   acetaminophen (TYLENOL) 650 mg, Oral, Every 6 hours PRN   albuterol (PROVENTIL) 2.5 mg, Nebulization, Every 6 hours PRN   albuterol (VENTOLIN HFA) 108 (90 Base) MCG/ACT inhaler 2 puffs, Inhalation, Every 6 hours PRN   augmented betamethasone dipropionate (DIPROLENE-AF) 0.05 % cream Topical, 2 times daily   budesonide-formoterol (SYMBICORT) 160-4.5 MCG/ACT inhaler 2 puffs, Inhalation, 2 times daily   buPROPion (WELLBUTRIN XL) 150 mg, Oral, Daily    clonazePAM (KLONOPIN) 0.5 mg, Oral, See admin instructions, #2 at 8am, #1 at 2pm ([name])   clopidogrel (PLAVIX) 75 mg, Oral, Daily   DULoxetine (CYMBALTA) 60 mg, Oral, BH-each morning   EPIPEN 2-PAK 0.3 MG/0.3ML SOAJ injection Reported on 04/29/2016   esomeprazole (NEXIUM) 40 mg, Oral, BH-each morning   fexofenadine (ALLEGRA) 180 mg, Oral, Every morning, 8am ([place])   fluticasone (VERAMYST) 27.5 MCG/SPRAY nasal spray 2 sprays, Nasal, Daily PRN   HYDROcodone-acetaminophen (NORCO/VICODIN) 5-325 MG tablet 1 tablet, Oral, Every 6 hours PRN   hydrocortisone 2.5 % cream Topical, 2 times daily   lamoTRIgine (LAMICTAL) 50 mg, Oral, Daily at bedtime   Lancets (ONETOUCH ULTRASOFT) lancets Check blood sugars no more than twice daily   losartan (COZAAR) 25 mg, Oral, Every evening   montelukast (SINGULAIR) 10 mg, Oral, Daily at bedtime, [name]    Multiple Vitamin (MULTIVITAMIN) tablet 1 tablet, Oral, Daily   nystatin (MYCOSTATIN/NYSTOP) powder 1 application , Topical, 3 times daily   nystatin ointment (MYCOSTATIN) APPLY TOPICALLY TWICE A DAY AS NEEDED   ONETOUCH VERIO test strip use to check blood sugar no more than twice daily   pioglitazone-metformin (ACTOPLUS MET) 15-850 MG tablet 1 tablet, Oral, 2 times daily  with meals   simvastatin (ZOCOR) 40 mg, Oral, Daily at bedtime   solifenacin (VESICARE) 10 mg, Oral, Daily       Objective:   Physical Exam BP 138/88   Pulse 94   Temp 98 F (36.7 C) (Oral)   Resp 18   Ht 5\' 10"  (1.778 m)   Wt 227 lb 8 oz (103.2 kg)   SpO2 97%   BMI 32.64 kg/m  General:   Well developed, NAD, BMI noted. HEENT:  Normocephalic . Face symmetric, atraumatic Lungs:  CTA B Normal respiratory effort, no intercostal retractions, no accessory muscle use. Heart: RRR,  no murmur.  DM foot exam: No edema, good pedal pulses, pinprick examination normal Skin: Not pale. Not jaundice Neurologic:  alert & oriented X3.  Speech normal, gait appropriate for age and  unassisted Psych--  Cognition and judgment appear intact.  Cooperative with normal attention span and concentration.  Behavior appropriate. No anxious or depressed appearing.      Assessment     Assessment DM, mild neuropathy HTN Hyperlipidemia Morbid obesity (BMI 39 plus DM) Bipolar, Depression, cognitive issues after ECT 2007 (on disability) per   psychiatry, Dr Casimiro Needle DJD-- hydrocodone rarely rx by [place] ortho Asthma-Allergies ------------ Dr [name]  GERD, h/o dysphagia (chronic, neurogenic-transfer dysphagia? See GI note 12-2011) Neuro: --? stroke : saw neuro 2013 d/t B transient visual loss, ASA changed to plavix.  ---Saw neuro 11-2013: fall, syncope,parkinsonism d/t sapharis? CV: Enlarged ascending Ao , + Ao atherosclerosis  --per CT 12-2015,CT chest 08-2016 stable.    --MRI chest 10/2017 stable, no routine f/u Nl LE arterial dopplers 2015 Carotid artery disease: Per [place]  2011, last [place] 2018: <50% B,   08/2019 < 1-39% B, 09-2021 < 39%, stable consider recheck in 2 years  CT chest  ---RML  25mm: 07-2014, CT 12-2015, CT 08-2016, MRI chest 12,2018, CT chest 07/2018:Stable  HOH: L deaf L, R  hearing aid H/o Mnire's disease  Iron deficiency anemia: Noted 01/2018, + Hemoccult, 09-2018: Colonoscopy and EGD were completely normal  Urology- on Vesicare, sees urology  PLAN: DM: Last A1c 7, more active, has reduce portions, + weight loss.  Continue Actos plus met, check A1c. Neuropathy: Has some paresthesias at the toes, feet exam negative.  Encouraged good feet care. High cholesterol: On simvastatin, check FLP, AST ALT DJD, chronic back pain:  Since the last visit, had surgery on left thumb, states he is recuperating well. Overall, all MSK pains have improved after physical therapy, he feels much more mobile and has increased his physical activity. RTC 03/2019 for CPX

## 2022-12-21 NOTE — Patient Instructions (Addendum)
It was good to see you today  GO TO THE LAB : Get the blood work     Ryan Zhang, [place] Come back for physical exam May 2024

## 2022-12-21 NOTE — Assessment & Plan Note (Signed)
DM: Last A1c 7, more active, has reduce portions, + weight loss.  Continue Actos plus met, check A1c. Neuropathy: Has some paresthesias at the toes, feet exam negative.  Encouraged good feet care. High cholesterol: On simvastatin, check FLP, AST ALT DJD, chronic back pain:  Since the last visit, had surgery on left thumb, states he is recuperating well. Overall, all MSK pains have improved after physical therapy, he feels much more mobile and has increased his physical activity. RTC 03/2019 for CPX

## 2022-12-22 DIAGNOSIS — J301 Allergic rhinitis due to pollen: Secondary | ICD-10-CM | POA: Diagnosis not present

## 2022-12-22 DIAGNOSIS — J3089 Other allergic rhinitis: Secondary | ICD-10-CM | POA: Diagnosis not present

## 2022-12-22 DIAGNOSIS — J3081 Allergic rhinitis due to animal (cat) (dog) hair and dander: Secondary | ICD-10-CM | POA: Diagnosis not present

## 2022-12-22 LAB — MICROALBUMIN / CREATININE URINE RATIO
Creatinine,U: 124.1 mg/dL
Microalb Creat Ratio: 16.2 mg/g (ref 0.0–30.0)
Microalb, Ur: 20.1 mg/dL — ABNORMAL HIGH (ref 0.0–1.9)

## 2022-12-24 DIAGNOSIS — J301 Allergic rhinitis due to pollen: Secondary | ICD-10-CM | POA: Diagnosis not present

## 2022-12-24 DIAGNOSIS — J3089 Other allergic rhinitis: Secondary | ICD-10-CM | POA: Diagnosis not present

## 2022-12-28 DIAGNOSIS — R29898 Other symptoms and signs involving the musculoskeletal system: Secondary | ICD-10-CM | POA: Diagnosis not present

## 2022-12-28 DIAGNOSIS — M25642 Stiffness of left hand, not elsewhere classified: Secondary | ICD-10-CM | POA: Diagnosis not present

## 2022-12-28 DIAGNOSIS — M79645 Pain in left finger(s): Secondary | ICD-10-CM | POA: Diagnosis not present

## 2022-12-29 DIAGNOSIS — J3089 Other allergic rhinitis: Secondary | ICD-10-CM | POA: Diagnosis not present

## 2022-12-29 DIAGNOSIS — J3081 Allergic rhinitis due to animal (cat) (dog) hair and dander: Secondary | ICD-10-CM | POA: Diagnosis not present

## 2022-12-29 DIAGNOSIS — J301 Allergic rhinitis due to pollen: Secondary | ICD-10-CM | POA: Diagnosis not present

## 2023-01-05 DIAGNOSIS — J3089 Other allergic rhinitis: Secondary | ICD-10-CM | POA: Diagnosis not present

## 2023-01-05 DIAGNOSIS — J301 Allergic rhinitis due to pollen: Secondary | ICD-10-CM | POA: Diagnosis not present

## 2023-01-06 ENCOUNTER — Ambulatory Visit: Payer: Medicare Other | Admitting: Urology

## 2023-01-06 ENCOUNTER — Encounter: Payer: Self-pay | Admitting: Urology

## 2023-01-06 VITALS — BP 135/80 | HR 78 | Ht 70.0 in | Wt 225.0 lb

## 2023-01-06 DIAGNOSIS — N401 Enlarged prostate with lower urinary tract symptoms: Secondary | ICD-10-CM | POA: Diagnosis not present

## 2023-01-06 DIAGNOSIS — N35911 Unspecified urethral stricture, male, meatal: Secondary | ICD-10-CM | POA: Diagnosis not present

## 2023-01-06 NOTE — Addendum Note (Signed)
Addended by: Dema Severin on: 01/06/2023 10:47 AM   Modules accepted: Orders

## 2023-01-06 NOTE — Addendum Note (Signed)
Addended by: Pamala Hurry on: 01/06/2023 10:22 AM   Modules accepted: Orders

## 2023-01-06 NOTE — Progress Notes (Signed)
Assessment: 1. Benign localized prostatic hyperplasia with lower urinary tract symptoms (LUTS)   2. 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  Patient gets filled in [place]. Discussed psa testing-  will obtain today-   if ok will not continue in future FU 1 yr or sooner if problems arise.  Chief Complaint: luts  History of Present Illness:  Ryan Zhang is a 77 y.o. male who is seen in consultation from Colon Branch, MD for evaluation of LUTS.  Patient has been followed for years by [facility] urology in [place].  He recently transferred his care here as he lives close by.  He has a long history of BPH/lower urinary tract symptoms with prominent urgency.  This has been longstanding.  He previously took tamsulosin and alfuzosin but did not notice much benefit.  Over the last 2 years he has been taking Vesicare 10 mg which has helped him somewhat. IPSS = 14/3.  PSA has been in the 1.0 range  Patient also has a long history of BX O/meatal stenosis.  He has had a repeat circumcision and dilation back in 2010.  He states that this is stable but he does use a final when he voids to direct his urine stream.   Past Medical History:  Past Medical History:  Diagnosis Date   Allergy    allergy shots Dr. Velora Heckler   Anemia    Anxiety    Asthma    moderate persistant   Bipolar affective (HCC)    Cataract    both eyes   COPD (chronic obstructive pulmonary disease) (HCC)    Depression    Diabetes mellitus    DJD (degenerative joint disease)    Dysphagia    Gallstones    hx of, s/p cholecystectomy   GERD (gastroesophageal reflux disease)    Hepatitis A    as teenager 60's   Hollenhorst plaque    right eye   Hyperlipidemia    Hypertension    Kidney stones    hx of   Meniere's disease    Motility disorder, esophageal    Obesity    Pancreatitis    Personal history of colonic polyps  11/2004   hyperplastic.   Stroke [facility])    per MRI    Past Surgical History:  Past Surgical History:  Procedure Laterality Date   CATARACT EXTRACTION Bilateral 09/2015 and 10/2015   CHOLECYSTECTOMY     COLONOSCOPY     TOTAL KNEE ARTHROPLASTY Bilateral    both knees    Allergies:  Allergies  Allergen Reactions   Celexa  [Citalopram Hydrobromide] Other (See Comments)   Depakote  [Divalproex Sodium] Other (See Comments)   Methocarbamol     Other reaction(s): Hallucinations   Paroxetine Hcl     Other reaction(s): Insomnia   Smz-Tmp Ds [Sulfamethoxazole-Trimethoprim] Nausea And Vomiting   Other     "Symbrax" alteration in blood sugar, pt unsure if low or high blood sugar   Oxycodone-Acetaminophen     Other reaction(s): Unknown   Risperidone Other (See Comments)    REACTION: hyper    Family History:  Family History  Problem Relation Age of Onset   Diabetes Father    Heart disease Brother    Heart disease Maternal Grandmother    Cancer Sister        unknown type   Colon cancer Neg Hx    Prostate  cancer Neg Hx    Colon polyps Neg Hx    Esophageal cancer Neg Hx    Rectal cancer Neg Hx    Stomach cancer Neg Hx     Social History:  Social History   Tobacco Use   Smoking status: Never   Smokeless tobacco: Never  Substance Use Topics   Alcohol use: Not Currently   Drug use: No    Review of symptoms:  Constitutional:  Negative for unexplained weight loss, night sweats, fever, chills ENT:  Negative for nose bleeds, sinus pain, painful swallowing CV:  Negative for chest pain, shortness of breath, exercise intolerance, palpitations, loss of consciousness Resp:  Negative for cough, wheezing, shortness of breath GI:  Negative for nausea, vomiting, diarrhea, bloody stools GU:  Positives noted in HPI; otherwise negative for gross hematuria, dysuria, urinary incontinence Neuro:  Negative for seizures, poor balance, limb weakness, slurred speech Psych:  Negative for lack  of energy, depression, anxiety Endocrine:  Negative for polydipsia, polyuria, symptoms of hypoglycemia (dizziness, hunger, sweating) Hematologic:  Negative for anemia, purpura, petechia, prolonged or excessive bleeding, use of anticoagulants  Allergic:  Negative for difficulty breathing or choking as a result of exposure to anything; no shellfish allergy; no allergic response (rash/itch) to materials, foods  Physical exam: BP 135/80   Pulse 78   Ht 5' 10"$  (1.778 m)   Wt 225 lb (102.1 kg)   BMI 32.28 kg/m  GENERAL APPEARANCE:  Well appearing, well developed, well nourished, NAD  GU:  BXO/meatal stenosis.  Nl testes and cords. DRE:  nst, prostate 40gm without n/i

## 2023-01-07 LAB — PSA: Prostate Specific Ag, Serum: 0.2 ng/mL (ref 0.0–4.0)

## 2023-01-12 DIAGNOSIS — J3081 Allergic rhinitis due to animal (cat) (dog) hair and dander: Secondary | ICD-10-CM | POA: Diagnosis not present

## 2023-01-12 DIAGNOSIS — J3089 Other allergic rhinitis: Secondary | ICD-10-CM | POA: Diagnosis not present

## 2023-01-12 DIAGNOSIS — J301 Allergic rhinitis due to pollen: Secondary | ICD-10-CM | POA: Diagnosis not present

## 2023-01-19 DIAGNOSIS — J301 Allergic rhinitis due to pollen: Secondary | ICD-10-CM | POA: Diagnosis not present

## 2023-01-19 DIAGNOSIS — J3089 Other allergic rhinitis: Secondary | ICD-10-CM | POA: Diagnosis not present

## 2023-01-19 DIAGNOSIS — J3081 Allergic rhinitis due to animal (cat) (dog) hair and dander: Secondary | ICD-10-CM | POA: Diagnosis not present

## 2023-01-20 DIAGNOSIS — M79645 Pain in left finger(s): Secondary | ICD-10-CM | POA: Diagnosis not present

## 2023-01-25 DIAGNOSIS — J3089 Other allergic rhinitis: Secondary | ICD-10-CM | POA: Diagnosis not present

## 2023-01-25 DIAGNOSIS — J301 Allergic rhinitis due to pollen: Secondary | ICD-10-CM | POA: Diagnosis not present

## 2023-01-25 DIAGNOSIS — M79645 Pain in left finger(s): Secondary | ICD-10-CM | POA: Diagnosis not present

## 2023-01-28 NOTE — Progress Notes (Signed)
[facility] Consult Note:  History: Ryan Zhang 02/01/2023  Referring provider: Colon Branch, MD  Reason for consult/chief complaint: No chief complaint on file.   Subjective  HPI: Pt last saw me in 2019 for mild iron deficiency anemia and subsequent stool testing was positive for Hemoccult. He had an EGD and colonoscopy with me (reports below *** ).   Patient presents to clinic today for an evaluation of acid reflux and chronic cough per the request of Dr. Kathlene November.   Today,    ROS: Review of Systems   Past Medical History: Past Medical History:  Diagnosis Date   Allergy    allergy shots Dr. Velora Heckler   Anemia    Anxiety    Asthma    moderate persistant   Bipolar affective (HCC)    Cataract    both eyes   COPD (chronic obstructive pulmonary disease) (HCC)    Depression    Diabetes mellitus    DJD (degenerative joint disease)    Dysphagia    Gallstones    hx of, s/p cholecystectomy   GERD (gastroesophageal reflux disease)    Hepatitis A    as teenager 60's   Hollenhorst plaque    right eye   Hyperlipidemia    Hypertension    Kidney stones    hx of   Meniere's disease    Motility disorder, esophageal    Obesity    Pancreatitis    Personal history of colonic polyps 11/2004   hyperplastic.   Stroke [facility])    per MRI     Past Surgical History: Past Surgical History:  Procedure Laterality Date   CATARACT EXTRACTION Bilateral 09/2015 and 10/2015   CHOLECYSTECTOMY     COLONOSCOPY     TOTAL KNEE ARTHROPLASTY Bilateral    both knees     Family History: Family History  Problem Relation Age of Onset   Diabetes Father    Heart disease Brother    Heart disease Maternal Grandmother    Cancer Sister        unknown type   Colon cancer Neg Hx    Prostate cancer Neg Hx    Colon polyps Neg Hx    Esophageal cancer Neg Hx    Rectal cancer Neg Hx    Stomach cancer Neg Hx     Social History: Social History   Socioeconomic  History   Marital status: Married    Spouse name: Not on file   Number of children: 1   Years of education: 17   Highest education level: Bachelor's degree (e.g., BA, AB, BS)  Occupational History   Occupation: disabled (due to s/e of ECT)    Employer: DISABLED    Comment: laboratory computing/programming  Tobacco Use   Smoking status: Never   Smokeless tobacco: Never  Substance and Sexual Activity   Alcohol use: Not Currently   Drug use: No   Sexual activity: Not Currently  Other Topics Concern   Not on file  Social History Narrative   - Household-- pt , wife   - adopted  daughter (h/o substance abuse,on a methadone program, doing better )   - disability d/t ECT (2007) - was left w/ cognitive problems    Social Determinants of Health   Financial Resource Strain: Low Risk  (05/10/2022)   Overall Financial Resource Strain (CARDIA)    Difficulty of Paying Living Expenses: Not very hard  Food Insecurity: No Food Insecurity (05/10/2022)   Hunger Vital Sign  Worried About Charity fundraiser in the Last Year: Never true    [place] in the Last Year: Never true  Transportation Needs: No Transportation Needs (05/10/2022)   PRAPARE - Hydrologist (Medical): No    Lack of Transportation (Non-Medical): No  Physical Activity: Insufficiently Active (05/10/2022)   Exercise Vital Sign    Days of Exercise per Week: 2 days    Minutes of Exercise per Session: 60 min  Stress: No Stress Concern Present (05/10/2022)   [place]    Feeling of Stress : Not at all  Social Connections: Moderately Integrated (04/28/2021)   Social Connection and Isolation Panel [NHANES]    Frequency of Communication with Friends and Family: More than three times a week    Frequency of Social Gatherings with Friends and Family: Once a week    Attends Religious Services: More than 4 times per year    Active Member  of [employer] or Organizations: No    Attends Archivist Meetings: Never    Marital Status: Married    Allergies: Allergies  Allergen Reactions   Celexa  [Citalopram Hydrobromide] Other (See Comments)   Depakote  [Divalproex Sodium] Other (See Comments)   Methocarbamol     Other reaction(s): Hallucinations   Paroxetine Hcl     Other reaction(s): Insomnia   Smz-Tmp Ds [Sulfamethoxazole-Trimethoprim] Nausea And Vomiting   Other     "Symbrax" alteration in blood sugar, pt unsure if low or high blood sugar   Oxycodone-Acetaminophen     Other reaction(s): Unknown   Risperidone Other (See Comments)    REACTION: hyper    Outpatient Meds: Current Outpatient Medications  Medication Sig Dispense Refill   acetaminophen (TYLENOL) 325 MG tablet Take 2 tablets (650 mg total) by mouth every 6 (six) hours as needed (mild pain; headache).     albuterol (PROVENTIL) (2.5 MG/3ML) 0.083% nebulizer solution Take 3 mLs (2.5 mg total) by nebulization every 6 (six) hours as needed for wheezing or shortness of breath. 180 mL 1   albuterol (VENTOLIN HFA) 108 (90 Base) MCG/ACT inhaler Inhale 2 puffs into the lungs every 6 (six) hours as needed for wheezing or shortness of breath. 18 g 6   augmented betamethasone dipropionate (DIPROLENE-AF) 0.05 % cream Apply topically 2 (two) times daily. 60 g 0   budesonide-formoterol (SYMBICORT) 160-4.5 MCG/ACT inhaler Inhale 2 puffs into the lungs 2 (two) times daily.      buPROPion (WELLBUTRIN XL) 150 MG 24 hr tablet Take 150 mg by mouth daily.     clonazePAM (KLONOPIN) 0.5 MG tablet Take 0.5 mg by mouth See admin instructions. #2 at 8am, #1 at 2pm [facility])     clopidogrel (PLAVIX) 75 MG tablet Take 1 tablet (75 mg total) by mouth daily. 90 tablet 1   DULoxetine (CYMBALTA) 60 MG capsule Take 60 mg by mouth every morning.     EPIPEN 2-PAK 0.3 MG/0.3ML SOAJ injection Reported on 04/29/2016 (Patient not taking: Reported on 08/16/2022)     esomeprazole (NEXIUM) 40 MG  capsule Take 40 mg by mouth every morning.      fexofenadine (ALLEGRA) 180 MG tablet Take 180 mg by mouth every morning. 8am [name])     fluticasone (VERAMYST) 27.5 MCG/SPRAY nasal spray Place 2 sprays into the nose daily as needed for rhinitis or allergies.      HYDROcodone-acetaminophen (NORCO/VICODIN) 5-325 MG tablet Take 1 tablet by mouth every  6 (six) hours as needed for severe pain. (Patient not taking: Reported on 12/21/2022) 15 tablet 0   hydrocortisone 2.5 % cream Apply topically 2 (two) times daily. 60 g 0   lamoTRIgine (LAMICTAL) 25 MG tablet Take 50 mg by mouth at bedtime.     Lancets (ONETOUCH ULTRASOFT) lancets Check blood sugars no more than twice daily 100 each 12   losartan (COZAAR) 25 MG tablet Take 1 tablet (25 mg total) by mouth every evening. 90 tablet 1   montelukast (SINGULAIR) 10 MG tablet Take 10 mg by mouth at bedtime. [name]     Multiple Vitamin (MULTIVITAMIN) tablet Take 1 tablet by mouth daily.      nystatin (MYCOSTATIN/NYSTOP) powder Apply 1 application. topically 3 (three) times daily.     nystatin ointment (MYCOSTATIN) APPLY TOPICALLY TWICE A DAY AS NEEDED 30 g 0   ONETOUCH VERIO test strip use to check blood sugar no more than twice daily 100 each 12   pioglitazone-metformin (ACTOPLUS MET) 15-850 MG tablet Take 1 tablet by mouth 2 (two) times daily with a meal. 180 tablet 1   simvastatin (ZOCOR) 40 MG tablet Take 1 tablet (40 mg total) by mouth at bedtime. 90 tablet 1   solifenacin (VESICARE) 10 MG tablet Take 1 tablet (10 mg total) by mouth daily. 90 tablet 1   No current facility-administered medications for this visit.      ___________________________________________________________________ Objective   Exam:  There were no vitals taken for this visit. Wt Readings from Last 3 Encounters:  01/06/23 225 lb (102.1 kg)  12/21/22 227 lb 8 oz (103.2 kg)  10/05/22 232 lb 4 oz (105.3 kg)    General: well-appearing ***  Eyes: sclera anicteric, no  redness ENT: oral mucosa moist without lesions, no cervical or supraclavicular lymphadenopathy CV: ***, no JVD, no peripheral edema Resp: clear to auscultation bilaterally, normal RR and effort noted GI: soft, *** tenderness, with active bowel sounds. No guarding or palpable organomegaly noted. Skin; warm and dry, no rash or jaundice noted Neuro: awake, alert and oriented x 3. Normal gross motor function and fluent speech  Labs:    Latest Ref Rng & Units 08/16/2022   10:39 AM 12/25/2021    4:20 PM 03/13/2021   11:28 AM  CBC  WBC 4.0 - 10.5 K/uL 10.3  8.1  6.5   Hemoglobin 13.0 - 17.0 g/dL 11.2  11.0  11.6   Hematocrit 39.0 - 52.0 % 34.5  33.9  35.7   Platelets 150.0 - 400.0 K/uL 367.0  269  288.0       Latest Ref Rng & Units 12/21/2022   10:25 AM 08/26/2022    1:42 PM 08/16/2022   10:39 AM  CMP  Glucose 70 - 99 mg/dL  147  172   BUN 6 - 23 mg/dL  27  31   Creatinine 0.40 - 1.50 mg/dL  1.01  1.00   Sodium 135 - 145 mEq/L  139  139   Potassium 3.5 - 5.1 mEq/L  4.6  5.5 No hemolysis seen   Chloride 96 - 112 mEq/L  104  102   CO2 19 - 32 mEq/L  23  26   Calcium 8.4 - 10.5 mg/dL  8.9  8.9   AST 0 - 37 U/L 12     ALT 0 - 53 U/L 9        Iron/TIBC/Ferritin/ %Sat    Component Value Date/Time   IRON 76 03/31/2018 1532   FERRITIN 29.0  03/31/2018 1532   IRONPCTSAT 17.0 (L) 03/31/2018 1532      Radiologic Studies:  GI Hx: Colonoscopy 10-06-18 - The entire examined colon is normal on direct and retroflexion views.  - No specimens collected.  No cause for heme positive stool seen. It is not clear that was related to the mild anemia that has now resolved.  EGD 10-06-18 - Normal esophagus. - Normal stomach. - Normal examined duodenum. - No specimens collected. No cause for heem positive stool seen  Assessment: No diagnosis found.  ***   Plan: ***   Thank you for the courtesy of this consult.  Please call me with any questions or concerns.  Rutherford Limerick  CC:  Referring provider noted above   I,Safa M Kadhim,acting as a scribe for Nelida Meuse III, MD.,have documented all relevant documentation on the behalf of Doran Stabler, MD,as directed by  Doran Stabler, MD while in the presence of Doran Stabler, MD.

## 2023-01-29 ENCOUNTER — Emergency Department (HOSPITAL_BASED_OUTPATIENT_CLINIC_OR_DEPARTMENT_OTHER): Payer: Medicare Other

## 2023-01-29 ENCOUNTER — Other Ambulatory Visit: Payer: Self-pay

## 2023-01-29 ENCOUNTER — Emergency Department (HOSPITAL_BASED_OUTPATIENT_CLINIC_OR_DEPARTMENT_OTHER)
Admission: EM | Admit: 2023-01-29 | Discharge: 2023-01-29 | Disposition: A | Discharge status: Home / Self Care | Payer: Medicare Other | Attending: Emergency Medicine | Admitting: Emergency Medicine

## 2023-01-29 ENCOUNTER — Encounter (HOSPITAL_BASED_OUTPATIENT_CLINIC_OR_DEPARTMENT_OTHER): Payer: Self-pay | Admitting: Urology

## 2023-01-29 DIAGNOSIS — J984 Other disorders of lung: Secondary | ICD-10-CM | POA: Insufficient documentation

## 2023-01-29 DIAGNOSIS — M62838 Other muscle spasm: Secondary | ICD-10-CM | POA: Diagnosis not present

## 2023-01-29 DIAGNOSIS — I10 Essential (primary) hypertension: Secondary | ICD-10-CM | POA: Insufficient documentation

## 2023-01-29 DIAGNOSIS — R0789 Other chest pain: Secondary | ICD-10-CM | POA: Diagnosis not present

## 2023-01-29 DIAGNOSIS — I2510 Atherosclerotic heart disease of native coronary artery without angina pectoris: Secondary | ICD-10-CM | POA: Insufficient documentation

## 2023-01-29 DIAGNOSIS — Z79899 Other long term (current) drug therapy: Secondary | ICD-10-CM | POA: Insufficient documentation

## 2023-01-29 DIAGNOSIS — Y93K1 Activity, walking an animal: Secondary | ICD-10-CM | POA: Insufficient documentation

## 2023-01-29 DIAGNOSIS — I700 Atherosclerosis of aorta: Secondary | ICD-10-CM | POA: Diagnosis not present

## 2023-01-29 DIAGNOSIS — W19XXXA Unspecified fall, initial encounter: Secondary | ICD-10-CM | POA: Diagnosis not present

## 2023-01-29 DIAGNOSIS — R109 Unspecified abdominal pain: Secondary | ICD-10-CM | POA: Diagnosis not present

## 2023-01-29 DIAGNOSIS — J45909 Unspecified asthma, uncomplicated: Secondary | ICD-10-CM | POA: Diagnosis not present

## 2023-01-29 DIAGNOSIS — J449 Chronic obstructive pulmonary disease, unspecified: Secondary | ICD-10-CM | POA: Diagnosis not present

## 2023-01-29 DIAGNOSIS — E119 Type 2 diabetes mellitus without complications: Secondary | ICD-10-CM | POA: Insufficient documentation

## 2023-01-29 DIAGNOSIS — R918 Other nonspecific abnormal finding of lung field: Secondary | ICD-10-CM | POA: Diagnosis not present

## 2023-01-29 DIAGNOSIS — R0781 Pleurodynia: Secondary | ICD-10-CM | POA: Diagnosis not present

## 2023-01-29 LAB — COMPREHENSIVE METABOLIC PANEL
ALT: 11 U/L (ref 0–44)
AST: 21 U/L (ref 15–41)
Albumin: 3.5 g/dL (ref 3.5–5.0)
Alkaline Phosphatase: 58 U/L (ref 38–126)
Anion gap: 4 — ABNORMAL LOW (ref 5–15)
BUN: 25 mg/dL — ABNORMAL HIGH (ref 8–23)
CO2: 25 mmol/L (ref 22–32)
Calcium: 8.5 mg/dL — ABNORMAL LOW (ref 8.9–10.3)
Chloride: 108 mmol/L (ref 98–111)
Creatinine, Ser: 0.95 mg/dL (ref 0.61–1.24)
GFR, Estimated: >60 mL/min (ref >60)
Glucose, Bld: 105 mg/dL — ABNORMAL HIGH (ref 70–99)
Potassium: 4.8 mmol/L (ref 3.5–5.1)
Sodium: 137 mmol/L (ref 135–145)
Total Bilirubin: 1 mg/dL (ref 0.3–1.2)
Total Protein: 7.2 g/dL (ref 6.5–8.1)

## 2023-01-29 LAB — CBC WITH DIFFERENTIAL/PLATELET
Abs Immature Granulocytes: 0.04 10*3/uL (ref 0.00–0.07)
Basophils Absolute: 0.1 10*3/uL (ref 0.0–0.1)
Basophils Relative: 1 %
Eosinophils Absolute: 0.4 10*3/uL (ref 0.0–0.5)
Eosinophils Relative: 4 %
HCT: 36 % — ABNORMAL LOW (ref 39.0–52.0)
Hemoglobin: 11 g/dL — ABNORMAL LOW (ref 13.0–17.0)
Immature Granulocytes: 1 %
Lymphocytes Relative: 31 %
Lymphs Abs: 2.6 10*3/uL (ref 0.7–4.0)
MCH: 25.9 pg — ABNORMAL LOW (ref 26.0–34.0)
MCHC: 30.6 g/dL (ref 30.0–36.0)
MCV: 84.7 fL (ref 80.0–100.0)
Monocytes Absolute: 0.8 10*3/uL (ref 0.1–1.0)
Monocytes Relative: 10 %
Neutro Abs: 4.3 10*3/uL (ref 1.7–7.7)
Neutrophils Relative %: 53 %
Platelets: 310 10*3/uL (ref 150–400)
RBC: 4.25 MIL/uL (ref 4.22–5.81)
RDW: 16.6 % — ABNORMAL HIGH (ref 11.5–15.5)
WBC: 8.1 10*3/uL (ref 4.0–10.5)
nRBC: 0 % (ref 0.0–0.2)

## 2023-01-29 LAB — LIPASE, BLOOD: Lipase: 22 U/L (ref 11–51)

## 2023-01-29 MED ORDER — IOHEXOL 300 MG/ML  SOLN
100.0000 mL | Freq: Once | INTRAMUSCULAR | Status: AC | PRN
Start: 2023-01-29 — End: 2023-01-29
  Administered 2023-01-29: 100 mL via INTRAVENOUS

## 2023-01-29 MED ORDER — LIDOCAINE 5 % EX PTCH: 1.0000 | MEDICATED_PATCH | CUTANEOUS | 0 refills | Status: DC

## 2023-01-29 NOTE — ED Triage Notes (Signed)
Pt states fell yesterday walking the dog  Pt states LUQ pain/ Left side rib pain worse with breathing  States pain worse with movement

## 2023-01-29 NOTE — ED Provider Notes (Signed)
Ryan Zhang   CSN: [id number] Arrival date & time: 01/29/23  1524     History  Chief Complaint  Patient presents with   Lytle Michaels    Ryan Zhang is a 77 y.o. male.  The history is provided by the patient and medical records. No language interpreter was used.  Fall This is a new problem. The current episode started yesterday. The problem occurs rarely. The problem has not changed since onset.Associated symptoms include chest pain, abdominal pain and shortness of breath. Pertinent negatives include no headaches. Nothing aggravates the symptoms. Nothing relieves the symptoms. He has tried nothing for the symptoms. The treatment provided no relief.       Home Medications Prior to Admission medications   Medication Sig Start Date End Date Taking? Authorizing Provider  acetaminophen (TYLENOL) 325 MG tablet Take 2 tablets (650 mg total) by mouth every 6 (six) hours as needed (mild pain; headache). 02/06/20   Georgette Shell, MD  albuterol (PROVENTIL) (2.5 MG/3ML) 0.083% nebulizer solution Take 3 mLs (2.5 mg total) by nebulization every 6 (six) hours as needed for wheezing or shortness of breath. 12/28/21   Colon Branch, MD  albuterol (VENTOLIN HFA) 108 (90 Base) MCG/ACT inhaler Inhale 2 puffs into the lungs every 6 (six) hours as needed for wheezing or shortness of breath. 02/11/20   Colon Branch, MD  augmented betamethasone dipropionate (DIPROLENE-AF) 0.05 % cream Apply topically 2 (two) times daily. 04/19/22   Colon Branch, MD  budesonide-formoterol [facility]) 160-4.5 MCG/ACT inhaler Inhale 2 puffs into the lungs 2 (two) times daily.     [provider]  buPROPion (WELLBUTRIN XL) 150 MG 24 hr tablet Take 150 mg by mouth daily. 02/15/21   [provider]  clonazePAM (KLONOPIN) 0.5 MG tablet Take 0.5 mg by mouth See admin instructions. #2 at 8am, #1 at 2pm [facility])    [provider]  clopidogrel (PLAVIX) 75  MG tablet Take 1 tablet (75 mg total) by mouth daily. 11/29/22   Colon Branch, MD  DULoxetine (CYMBALTA) 60 MG capsule Take 60 mg by mouth every morning.    [provider]  EPIPEN 2-PAK 0.3 MG/0.3ML SOAJ injection Reported on 04/29/2016 Patient not taking: Reported on 08/16/2022 07/04/14   [provider]  esomeprazole (NEXIUM) 40 MG capsule Take 40 mg by mouth every morning.     [provider]  fexofenadine (ALLEGRA) 180 MG tablet Take 180 mg by mouth every morning. 8am [name])    [provider]  fluticasone (VERAMYST) 27.5 MCG/SPRAY nasal spray Place 2 sprays into the nose daily as needed for rhinitis or allergies.     [provider]  HYDROcodone-acetaminophen (NORCO/VICODIN) 5-325 MG tablet Take 1 tablet by mouth every 6 (six) hours as needed for severe pain. Patient not taking: Reported on 12/21/2022 07/18/22   Jaynee Eagles, PA-C  hydrocortisone 2.5 % cream Apply topically 2 (two) times daily. 03/31/22 03/31/23  Colon Branch, MD  lamoTRIgine (LAMICTAL) 25 MG tablet Take 50 mg by mouth at bedtime.    [provider]  Lancets [name] ULTRASOFT) lancets Check blood sugars no more than twice daily 12/09/16   Colon Branch, MD  losartan (COZAAR) 25 MG tablet Take 1 tablet (25 mg total) by mouth every evening. 10/21/22   Colon Branch, MD  montelukast (SINGULAIR) 10 MG tablet Take 10 mg by mouth at bedtime. [name]    [provider]  Multiple Vitamin (MULTIVITAMIN) tablet Take 1 tablet by mouth daily.     [provider]  nystatin (MYCOSTATIN/NYSTOP) powder Apply 1 application. topically 3 (three) times daily.    [provider]  nystatin ointment (MYCOSTATIN) APPLY TOPICALLY TWICE A DAY AS NEEDED 08/23/22   Colon Branch, MD  [facility] VERIO test strip use to check blood sugar no more than twice daily 12/03/19   Colon Branch, MD  pioglitazone-metformin (ACTOPLUS MET) [phone] MG tablet Take 1 tablet by mouth 2 (two) times daily  with a meal. 11/29/22   Colon Branch, MD  simvastatin (ZOCOR) 40 MG tablet Take 1 tablet (40 mg total) by mouth at bedtime. 11/16/22   Colon Branch, MD  solifenacin (VESICARE) 10 MG tablet Take 1 tablet (10 mg total) by mouth daily. 11/26/20   Colon Branch, MD      Allergies    Celexa  [citalopram hydrobromide], Depakote  [divalproex sodium], Methocarbamol, Paroxetine hcl, Smz-tmp ds [sulfamethoxazole-trimethoprim], Other, Oxycodone-acetaminophen, and Risperidone    Review of Systems   Review of Systems  Constitutional:  Negative for chills, fatigue and fever.  HENT:  Negative for congestion.   Eyes:  Negative for visual disturbance.  Respiratory:  Positive for shortness of breath. Negative for cough and chest tightness.   Cardiovascular:  Positive for chest pain. Negative for palpitations.  Gastrointestinal:  Positive for abdominal pain. Negative for constipation, diarrhea, nausea and vomiting.  Genitourinary:  Positive for flank pain. Negative for dysuria.  Musculoskeletal:  Negative for back pain, neck pain and neck stiffness.  Skin:  Negative for rash and wound.  Neurological:  Negative for headaches.  Psychiatric/Behavioral:  Negative for agitation.   All other systems reviewed and are negative.   Physical Exam Updated Vital Signs BP (!) 156/82 (BP Location: Left Arm)   Pulse 81   Temp 97.6 F (36.4 C) (Oral)   Resp 18   Ht 5\' 10"  (1.778 m)   Wt 102.1 kg   SpO2 95%   BMI 32.30 kg/m  Physical Exam Vitals and nursing Zhang reviewed.  Constitutional:      General: He is not in acute distress.    Appearance: He is well-developed. He is not ill-appearing, toxic-appearing or diaphoretic.  HENT:     Head: Normocephalic and atraumatic.     Mouth/Throat:     Mouth: Mucous membranes are moist.  Eyes:     Conjunctiva/sclera: Conjunctivae normal.     Pupils: Pupils are equal, round, and reactive to light.  Cardiovascular:     Rate and Rhythm: Normal rate and regular rhythm.      Heart sounds: No murmur heard. Pulmonary:     Effort: Pulmonary effort is normal. No respiratory distress.     Breath sounds: Normal breath sounds.  Chest:     Chest wall: Tenderness present.  Abdominal:     General: Abdomen is flat.     Palpations: Abdomen is soft.     Tenderness: There is abdominal tenderness.  Musculoskeletal:        General: Tenderness present. No swelling.     Cervical back: Neck supple.  Skin:    General: Skin is warm and dry.     Capillary Refill: Capillary refill takes less than 2 seconds.     Findings: No erythema.  Neurological:     Mental Status: He is alert.  Psychiatric:        Mood and Affect: Mood normal.     ED Results /  Procedures / Treatments   Labs (all labs ordered are listed, but only abnormal results are displayed) Labs Reviewed  CBC WITH DIFFERENTIAL/PLATELET - Abnormal; Notable for the following components:      Result Value   Hemoglobin 11.0 (*)    HCT 36.0 (*)    MCH 25.9 (*)    RDW 16.6 (*)    All other components within normal limits  COMPREHENSIVE METABOLIC PANEL - Abnormal; Notable for the following components:   Glucose, Bld 105 (*)    BUN 25 (*)    Calcium 8.5 (*)    Anion gap 4 (*)    All other components within normal limits  LIPASE, BLOOD    EKG None  Radiology CT CHEST ABDOMEN PELVIS W CONTRAST  Result Date: 01/29/2023 CLINICAL DATA:  Fall, left side pain EXAM: CT CHEST, ABDOMEN, AND PELVIS WITH CONTRAST TECHNIQUE: Multidetector CT imaging of the chest, abdomen and pelvis was performed following the standard protocol during bolus administration of intravenous contrast. RADIATION DOSE REDUCTION: This exam was performed according to the departmental dose-optimization program which includes automated exposure control, adjustment of the mA and/or kV according to patient size and/or use of iterative reconstruction technique. CONTRAST:  134mL OMNIPAQUE IOHEXOL 300 MG/ML  SOLN COMPARISON:  07/18/2018 FINDINGS: CT CHEST  FINDINGS Cardiovascular: Coronary artery and aortic calcifications. Heart is normal size. Aorta is normal caliber. Mediastinum/Nodes: No mediastinal, hilar, or axillary adenopathy. Stable small scattered mediastinal lymph nodes. Trachea and esophagus are unremarkable. Thyroid unremarkable. Lungs/Pleura: Linear and airspace opacities in the posterior left upper lobe and adjacent left lower lobe in area of prior pneumonia/consolidation on prior CT. This may reflect scarring. Scarring at the right lung base. No effusions. Right upper lobe nodule on image 60 measures 5 mm compared to 5 mm previously. Right middle lobe nodule measures 8 mm on image 77 compared to 9 mm previously. No pneumothorax. Musculoskeletal: No acute bony abnormality. CT ABDOMEN PELVIS FINDINGS Hepatobiliary: No focal hepatic abnormality. Gallbladder unremarkable. Pancreas: Atrophy.  No focal abnormality or ductal dilatation. Spleen: No splenic injury or perisplenic hematoma. Adrenals/Urinary Tract: No adrenal hemorrhage or renal injury identified. Bladder is unremarkable. Stomach/Bowel: Sigmoid diverticulosis. No active diverticulitis. Stomach and small bowel decompressed, unremarkable. Vascular/Lymphatic: Aortic atherosclerosis. No evidence of aneurysm or adenopathy. Reproductive: No visible focal abnormality. Other: No free fluid or free air. Musculoskeletal: No acute bony abnormality. Small bilateral inguinal hernias containing fat. IMPRESSION: No significant traumatic injury or acute findings in the chest, abdomen or pelvis. Airspace opacities and linear densities in the lower lobes and posterior left upper lobe. I favor areas of scarring. These could be followed with repeat CT in 6 months to ensure stability. Right upper lobe and right middle lobe nodules are stable since 2019 compatible with benign nodules. Coronary artery disease, aortic atherosclerosis. Electronically Signed   By: Rolm Baptise M.D.   On: 01/29/2023 19:34   DG Ribs  Unilateral W/Chest Left  Result Date: 01/29/2023 CLINICAL DATA:  Fall with rib pain. EXAM: LEFT RIBS AND CHEST - 3+ VIEW COMPARISON:  Chest radiograph dated 02/23/2021. FINDINGS: No fracture or other bone lesions are seen involving the ribs. There is no evidence of pneumothorax or pleural effusion. There is mild-to-moderate left mid and lower lung atelectasis/scarring and/or airspace disease. The right lung is clear. There is no pleural effusion or pneumothorax. Heart size and mediastinal contours are within normal limits. Vascular calcifications are seen in the aortic arch. IMPRESSION: 1. No evidence of rib fracture. 2. Mild-to-moderate left mid and  lower lung atelectasis/scarring and/or airspace disease. Electronically Signed   By: Zerita Boers M.D.   On: 01/29/2023 16:52    Procedures Procedures    Medications Ordered in ED Medications  iohexol (OMNIPAQUE) 300 MG/ML solution 100 mL (100 mLs Intravenous Contrast Given 01/29/23 1858)    ED Course/ Medical Decision Making/ A&P                             Medical Decision Making Amount and/or Complexity of Data Reviewed Labs: ordered. Radiology: ordered.  Risk Prescription drug management.    Ryan Zhang is a 77 y.o. male with past medical history significant for hypertension, hyperlipidemia, diabetes, GERD, COPD, previous cholecystectomy, previous stroke, and asthma who presents with fall and left-sided torso pain.  According to patient, he was walking small dog and going from a brick walkway to some grass when he tripped and fell landing on his left side.  He reports that yesterday he has had severe pain in his left chest and left abdomen that is up to 9 out of 10 in severity.  It is waxed and waned in severity.  He denies any head injury or loss of consciousness.  Denies any neck pain or back pain.  Denies any nausea, vomiting, constipation, or diarrhea.  Denies any preceding symptoms.  Reports pain is slightly improved but he is  concerned about torso injury or rib fractures.  He reports he is at multiple pneumonias in the past but no history of rib injuries.  On exam, left chest is tender to palpation as is left abdomen in the left upper quadrant and left flank.  No bruising or laceration seen.  Rest of abdomen nontender.  Lungs were clear and I do not appreciate any rhonchi.  No focal neurologic deficits initially.  Moving all extremities.  No evidence of head trauma.  Patient had x-ray in triage that showed some atelectasis or scarring but no rib fractures.  Suspect some of the scarring could have been from previous pneumonia or could be pulmonary contusion.  We had a shared decision-making conversation offering symptomatic medications and outpatient follow-up versus getting labs and CT imaging to rule out multiple rib fractures, pulmonary contusion, or even splenic injury given the amount of pleuritic discomfort.  Patient would like to get the images and labs.  Will order the imaging and lab workup and if is reassuring, dissipate discharge with medications.  CT imaging overall reassuring.  Labs reassuring.  I went over all the findings with patient.  He feels safe for discharge home and I agree.  Patient said he cannot take muscle relaxants due to hallucinations and will do Lidoderm patches and over-the-counter medications.  Patient agrees with plan of care and patient discharged in good condition for outpatient follow-up with PCP.         Final Clinical Impression(s) / ED Diagnoses Final diagnoses:  Fall, initial encounter  Muscle spasm    Rx / DC Orders ED Discharge Orders          Ordered    lidocaine (LIDODERM) 5 %  Every 24 hours,   Status:  Discontinued        01/29/23 2107    lidocaine (LIDODERM) 5 %  Every 24 hours        01/29/23 2108            Clinical Impression: 1. Fall, initial encounter   2. Muscle spasm  Disposition: Discharge  Condition: Good  I have discussed the  results, Dx and Tx plan with the pt(& family if present). He/she/they expressed understanding and agree(s) with the plan. Discharge instructions discussed at great length. Strict return precautions discussed and pt &/or family have verbalized understanding of the instructions. No further questions at time of discharge.    Current Discharge Medication List     START taking these medications   Details  lidocaine (LIDODERM) 5 % Place 1 patch onto the skin daily. Remove & Discard patch within 12 hours or as directed by MD Qty: 15 patch, Refills: 0        Follow Up: Colon Branch, [place] [place] [address] [phone]     [facility] [address] [id number] mc [place] [phone]        Joesiah Lonon, Gwenyth Allegra, MD 01/29/23 2109

## 2023-01-29 NOTE — Discharge Instructions (Signed)
Your history, exam, workup today led [place] to get imaging that did not show evidence of acute fracture, pulmonary contusion, or solid organ injury.  I suspect muscle spasm and soft tissue pains.  As we discussed, we feel you are safe for discharge home but please use over-the-counter medications and try using the Lidoderm patches to help with symptoms.  Please follow-up with your primary doctor.  If any symptoms change or worsen acutely, please return to the nearest emergency department.

## 2023-02-01 ENCOUNTER — Other Ambulatory Visit (HOSPITAL_COMMUNITY): Payer: Self-pay

## 2023-02-01 ENCOUNTER — Encounter: Payer: Self-pay | Admitting: Gastroenterology

## 2023-02-01 ENCOUNTER — Ambulatory Visit: Payer: Medicare Other | Admitting: Gastroenterology

## 2023-02-01 VITALS — BP 130/78 | HR 74 | Ht 70.0 in | Wt 233.3 lb

## 2023-02-01 DIAGNOSIS — R053 Chronic cough: Secondary | ICD-10-CM

## 2023-02-01 DIAGNOSIS — R1310 Dysphagia, unspecified: Secondary | ICD-10-CM

## 2023-02-01 DIAGNOSIS — K219 Gastro-esophageal reflux disease without esophagitis: Secondary | ICD-10-CM | POA: Diagnosis not present

## 2023-02-01 NOTE — Patient Instructions (Addendum)
_______________________________________________________  If your blood pressure at your visit was 140/90 or greater, please contact your primary care physician to follow up on this.  _______________________________________________________  If you are age 75 or older, your body mass index should be between 23-30. Your Body mass index is 33.48 kg/m. If this is out of the aforementioned range listed, please consider follow up with your Primary Care Provider.  If you are age 34 or younger, your body mass index should be between 19-25. Your Body mass index is 33.48 kg/m. If this is out of the aformentioned range listed, please consider follow up with your Primary Care Provider.   ________________________________________________________  The [facility] providers would like to encourage you to use [facility] to communicate with providers for non-urgent requests or questions.  Due to long hold times on the telephone, sending your provider a message by [facility] may be a faster and more efficient way to get a response.  Please allow 48 business hours for a response.  Please remember that this is for non-urgent requests.  _______________________________________________________  Dennis Bast have been scheduled for a modified barium swallow on 02-08-2023 at 11am. Please arrive 30 minutes prior to your test for registration. You will go to [facility] Radiology (1st Floor) for your appointment. Should you need to cancel or reschedule your appointment, please contact [phone] Gershon Mussel [place]) or [phone] [place]). _____________________________________________________________________ A Modified Barium Swallow Study, or MBS, is a special x-ray that is taken to check swallowing skills. It is carried out by a Stage manager and a Psychologist, clinical (SLP). During this test, yourmouth, throat, and esophagus, a muscular tube which connects your mouth to your stomach, is checked. The test will help you, your  doctor, and the SLP plan what types of foods and liquids are easier for you to swallow. The SLP will also identify positions and ways to help you swallow more easily and safely. What will happen during an MBS? You will be taken to an x-ray room and seated comfortably. You will be asked to swallow small amounts of food and liquid mixed with barium. Barium is a liquid or paste that allows images of your mouth, throat and esophagus to be seen on x-ray. The x-ray captures moving images of the food you are swallowing as it travels from your mouth through your throat and into your esophagus. This test helps identify whether food or liquid is entering your lungs (aspiration). The test also shows which part of your mouth or throat lacks strength or coordination to move the food or liquid in the right direction. This test typically takes 30 minutes to 1 hour to complete. _______________________________________________________________________  We have sent your records to ENT [facility]. They will call you to schedule. Here is the contact information.  [address] [place], [place] [address] [phone]  It was a pleasure to see you today!  Thank you for trusting me with your gastrointestinal care!

## 2023-02-02 ENCOUNTER — Telehealth: Payer: Self-pay

## 2023-02-02 DIAGNOSIS — J301 Allergic rhinitis due to pollen: Secondary | ICD-10-CM | POA: Diagnosis not present

## 2023-02-02 DIAGNOSIS — J3081 Allergic rhinitis due to animal (cat) (dog) hair and dander: Secondary | ICD-10-CM | POA: Diagnosis not present

## 2023-02-02 DIAGNOSIS — J3089 Other allergic rhinitis: Secondary | ICD-10-CM | POA: Diagnosis not present

## 2023-02-02 NOTE — Telephone Encounter (Signed)
Referral sent to ENT Dr Redmond Baseman, will await appointment info

## 2023-02-08 ENCOUNTER — Ambulatory Visit (HOSPITAL_COMMUNITY)
Admission: RE | Admit: 2023-02-08 | Discharge: 2023-02-08 | Disposition: A | Discharge status: Home / Self Care | Payer: Medicare Other | Source: Ambulatory Visit | Attending: Internal Medicine | Admitting: Internal Medicine

## 2023-02-08 DIAGNOSIS — K219 Gastro-esophageal reflux disease without esophagitis: Secondary | ICD-10-CM

## 2023-02-08 DIAGNOSIS — R1310 Dysphagia, unspecified: Secondary | ICD-10-CM

## 2023-02-08 DIAGNOSIS — R0989 Other specified symptoms and signs involving the circulatory and respiratory systems: Secondary | ICD-10-CM | POA: Diagnosis not present

## 2023-02-08 DIAGNOSIS — R053 Chronic cough: Secondary | ICD-10-CM

## 2023-02-08 DIAGNOSIS — R1313 Dysphagia, pharyngeal phase: Secondary | ICD-10-CM | POA: Diagnosis not present

## 2023-02-08 NOTE — Evaluation (Signed)
Modified Barium Swallow Study  Patient Details  Name: Ryan Zhang MRN: [id number] Date of Birth: [birth year]  Today's Date: 02/08/2023  Modified Barium Swallow completed.  Full report located under Chart Review in the Imaging Section.  History of Present Illness Pt is a 77 yo male referred for an OP MBS with c/o chronic cough and GERD. Per pt report, he most recently had PNA 2 years ago, although he reports he has had PNA multiple times. Pt reports experiencing a globus sensation with all consistencies, although most often with pills. Previous MBS (01/2012) revealed a moderate oropharyngeal dysphagia and per SLP note, a history of dysphagia x20 years. PMH includes DM, HLD, HTN, Meniere's disease, colonic polyps, esophageal motility disorder, GERD, CVA   Clinical Impression Pt reports increased symptoms of dysphagia within the past year, stating he experiences a globus sensation which triggers a reflexive cough. Oral motor exam WFL. Overall, pt presents with a moderate pharyngeal dysphagia characterized by spillage of liquids into the valleculae and over the epiglottis as well as incomplete epiglottic inversion. With consecutive sips of thin liquids, pt had reduced coordination of larger volumes which resulted in bolus spilling over the epiglottis before laryngeal vestibule closure was achieved. This resulted in silent aspiration, which appeared to be cleared with a cued cough. Transient penetration occurred with thin liquids and nectar thick liquids (PAS 4), but was ejected. SLP suggested implementing a chin tuck and an oral hold before the swallow to improve his coordination of boluses of thin liquids. The chin tuck alone still resulted in aspiration of thin liquids. Pt masticated solids minimally before swallowing, which likely impacts the amount of vallecular residue present during mealtimes. When administered whole with purees, the pill was retained in the valleculae even after subsequent bolus  presentations (spoonfuls of purees and honey thick liquids) until pt was able to cough and eject it from his mouth. This is likely representative of his swallowing function with larger solids, which he reports not wholly masticating and expectorating during meals. Education was provided to encourage pt to thoroughly masticate, eat foods with a more soft and moist consistency, and use strategies to aid pt's coordination of boluses. SLP suggested taking medications crushed with purees as able. Recommend pt continue current diet of regular textures and thin liquids using a chin tuck and oral hold. Pending any findings at ENT consult, could consider OP SLP to facilitate education and optimize function. Factors that may increase risk of adverse event in presence of aspiration ([place] 2021): Respiratory or GI disease;Poor general health and/or compromised immunity  Swallow Evaluation Recommendations Recommendations: PO diet PO Diet Recommendation: Regular;Thin liquids (Level 0) Liquid Administration via: Cup;Straw Medication Administration: Whole meds with puree (crush if able) Supervision: Patient able to self-feed Swallowing strategies  : Slow rate;Small bites/sips;Chin tuck (oral hold before swallowing) Postural changes: Position pt fully upright for meals;Stay upright 30-60 min after meals Oral care recommendations: Oral care BID (2x/day) Recommended consults: Consider ENT consultation     Fabio Asa., Student SLP  02/08/2023,2:14 PM

## 2023-02-09 NOTE — Telephone Encounter (Signed)
Per Dr Redmond Baseman office, patient has still not been scheduled and they explained that they are behind on referrals.

## 2023-02-16 DIAGNOSIS — J3089 Other allergic rhinitis: Secondary | ICD-10-CM | POA: Diagnosis not present

## 2023-02-16 DIAGNOSIS — J3081 Allergic rhinitis due to animal (cat) (dog) hair and dander: Secondary | ICD-10-CM | POA: Diagnosis not present

## 2023-02-16 DIAGNOSIS — J301 Allergic rhinitis due to pollen: Secondary | ICD-10-CM | POA: Diagnosis not present

## 2023-02-21 DIAGNOSIS — M79645 Pain in left finger(s): Secondary | ICD-10-CM | POA: Diagnosis not present

## 2023-02-23 DIAGNOSIS — J301 Allergic rhinitis due to pollen: Secondary | ICD-10-CM | POA: Diagnosis not present

## 2023-02-23 DIAGNOSIS — J3089 Other allergic rhinitis: Secondary | ICD-10-CM | POA: Diagnosis not present

## 2023-02-24 NOTE — Telephone Encounter (Addendum)
Patient has not yet been scheduled and I was asked to refax the referral. Fax sent to the number provided by Dr Jenne Pane office [phone]

## 2023-02-28 ENCOUNTER — Encounter: Payer: Self-pay | Admitting: Gastroenterology

## 2023-03-01 NOTE — Telephone Encounter (Signed)
Bonita Quin,     This is [facility], who we have been sending patients to for years.  Maybe they are an [facility] or [facility]drologist.  Please contact their referral office to help get this patient an appointment.

## 2023-03-04 DIAGNOSIS — J3081 Allergic rhinitis due to animal (cat) (dog) hair and dander: Secondary | ICD-10-CM | POA: Diagnosis not present

## 2023-03-04 DIAGNOSIS — J3089 Other allergic rhinitis: Secondary | ICD-10-CM | POA: Diagnosis not present

## 2023-03-04 DIAGNOSIS — J301 Allergic rhinitis due to pollen: Secondary | ICD-10-CM | POA: Diagnosis not present

## 2023-03-09 DIAGNOSIS — J3089 Other allergic rhinitis: Secondary | ICD-10-CM | POA: Diagnosis not present

## 2023-03-09 DIAGNOSIS — J301 Allergic rhinitis due to pollen: Secondary | ICD-10-CM | POA: Diagnosis not present

## 2023-03-10 NOTE — Telephone Encounter (Signed)
Appointment has been made for 04-14-2023 @ 845am

## 2023-03-16 DIAGNOSIS — J3089 Other allergic rhinitis: Secondary | ICD-10-CM | POA: Diagnosis not present

## 2023-03-16 DIAGNOSIS — J301 Allergic rhinitis due to pollen: Secondary | ICD-10-CM | POA: Diagnosis not present

## 2023-03-16 DIAGNOSIS — J3081 Allergic rhinitis due to animal (cat) (dog) hair and dander: Secondary | ICD-10-CM | POA: Diagnosis not present

## 2023-03-23 DIAGNOSIS — J3081 Allergic rhinitis due to animal (cat) (dog) hair and dander: Secondary | ICD-10-CM | POA: Diagnosis not present

## 2023-03-23 DIAGNOSIS — J3089 Other allergic rhinitis: Secondary | ICD-10-CM | POA: Diagnosis not present

## 2023-03-23 DIAGNOSIS — J301 Allergic rhinitis due to pollen: Secondary | ICD-10-CM | POA: Diagnosis not present

## 2023-03-30 DIAGNOSIS — J301 Allergic rhinitis due to pollen: Secondary | ICD-10-CM | POA: Diagnosis not present

## 2023-03-30 DIAGNOSIS — J3081 Allergic rhinitis due to animal (cat) (dog) hair and dander: Secondary | ICD-10-CM | POA: Diagnosis not present

## 2023-03-30 DIAGNOSIS — J3089 Other allergic rhinitis: Secondary | ICD-10-CM | POA: Diagnosis not present

## 2023-04-01 ENCOUNTER — Encounter: Payer: Self-pay | Admitting: Internal Medicine

## 2023-04-01 ENCOUNTER — Encounter: Payer: Self-pay | Admitting: Urology

## 2023-04-01 ENCOUNTER — Ambulatory Visit: Payer: Medicare Other | Admitting: Internal Medicine

## 2023-04-04 ENCOUNTER — Telehealth: Payer: Self-pay | Admitting: Urology

## 2023-04-04 NOTE — Telephone Encounter (Signed)
EDIT-  There was a cancellation on 5/22 , I got patient in with Dr Margo Aye at the end of the day.

## 2023-04-04 NOTE — Telephone Encounter (Signed)
Patient called and LVM about passing a blood clot and stated Dr Pete Glatter wanted patient to schedule with Dr Margo Aye sometime this week. Dr Margo Aye does not have any openings, as of right now.   Patients callback #: [phone]

## 2023-04-05 DIAGNOSIS — J301 Allergic rhinitis due to pollen: Secondary | ICD-10-CM | POA: Diagnosis not present

## 2023-04-05 DIAGNOSIS — J3089 Other allergic rhinitis: Secondary | ICD-10-CM | POA: Diagnosis not present

## 2023-04-05 DIAGNOSIS — J3081 Allergic rhinitis due to animal (cat) (dog) hair and dander: Secondary | ICD-10-CM | POA: Diagnosis not present

## 2023-04-06 ENCOUNTER — Ambulatory Visit: Payer: Medicare Other | Admitting: Urology

## 2023-04-06 ENCOUNTER — Encounter: Payer: Self-pay | Admitting: Urology

## 2023-04-06 VITALS — BP 175/84 | HR 83

## 2023-04-06 DIAGNOSIS — N35911 Unspecified urethral stricture, male, meatal: Secondary | ICD-10-CM

## 2023-04-06 DIAGNOSIS — N401 Enlarged prostate with lower urinary tract symptoms: Secondary | ICD-10-CM | POA: Diagnosis not present

## 2023-04-06 DIAGNOSIS — Z87440 Personal history of urinary (tract) infections: Secondary | ICD-10-CM | POA: Diagnosis not present

## 2023-04-06 DIAGNOSIS — R319 Hematuria, unspecified: Secondary | ICD-10-CM

## 2023-04-06 DIAGNOSIS — N390 Urinary tract infection, site not specified: Secondary | ICD-10-CM

## 2023-04-06 LAB — MICROSCOPIC EXAMINATION
Cast Type: NONE SEEN
Casts: NONE SEEN /lpf
Crystal Type: NONE SEEN
Crystals: NONE SEEN
Trichomonas, UA: NONE SEEN
WBC, UA: >30 /hpf — AB (ref 0–5)
Yeast, UA: NONE SEEN

## 2023-04-06 LAB — URINALYSIS, ROUTINE W REFLEX MICROSCOPIC
Bilirubin, UA: NEGATIVE
Glucose, UA: NEGATIVE
Nitrite, UA: POSITIVE — AB
Specific Gravity, UA: 1.03 (ref 1.005–1.030)
Urobilinogen, Ur: 2 mg/dL — ABNORMAL HIGH (ref 0.2–1.0)
pH, UA: 5.5 (ref 5.0–7.5)

## 2023-04-06 NOTE — Progress Notes (Addendum)
Assessment: 1. Benign localized prostatic hyperplasia with lower urinary tract symptoms (LUTS)   2. 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 Hematuria, unspecified type     Plan: Discussed recent episode of passing a clott/hematuria. Will not repeat UT imaging done 01/2023 Urine culture today- no UTI symptoms Schedule cystoscopy  Chief Complaint: Blood in urine  HPI: Ryan Zhang is a 77 y.o. male who presents today with complaints of having an episode recently where he had strangury and subsequently passed a small blood clot. He is on Plavix.  He has had no further issues and has had no known gross hematuria.  He does report that 2 to 3 years ago he had an episode of gross hematuria but did not undergo evaluation as he was just seen by his PCP.  At present he is back to baseline.  He denies any UTI symptoms.  Urinalysis is consistent with his phimosis and BXO.  CT scan chest abdomen and pelvis which was done on 01/29/2023 after a fall and left-sided pain revealed no significant GU findings.   Please see my note 01/06/2023 at the time of his initial visit for detailed history.  Patient has been followed for many years at [facility] urology. He has a long history of BPH/lower urinary tract symptoms with prominent urgency.  This has been longstanding.  He previously took tamsulosin and alfuzosin but did not notice much benefit.  Over the last 2 years he has been taking Vesicare 10 mg which has helped him somewhat. IPSS = 14/3.   PSA 12/2022= 0.20   Patient also has a long history of BX O/meatal stenosis.  He has had a repeat circumcision and dilation back in 2010.  He states that this is stable but he does use a funnel when he voids to direct his urine stream.  Portions of the above documentation were copied from a prior visit for review purposes only.  Allergies: Allergies  Allergen Reactions   Celexa  [Citalopram Hydrobromide] Other (See Comments)    Depakote  [Divalproex Sodium] Other (See Comments)   Methocarbamol     Other reaction(s): Hallucinations   Paroxetine Hcl     Other reaction(s): Insomnia   Smz-Tmp Ds [Sulfamethoxazole-Trimethoprim] Nausea And Vomiting   Other     "Symbrax" alteration in blood sugar, pt unsure if low or high blood sugar   Oxycodone-Acetaminophen     Other reaction(s): Unknown   Risperidone Other (See Comments)    REACTION: hyper    PMH: Past Medical History:  Diagnosis Date   Allergy    allergy shots Dr. Corinda Gubler   Anemia    Anxiety    Asthma    moderate persistant   Bipolar affective (HCC)    Cataract    both eyes   COPD (chronic obstructive pulmonary disease) (HCC)    Depression    Diabetes mellitus    DJD (degenerative joint disease)    Dysphagia    Gallstones    hx of, s/p cholecystectomy   GERD (gastroesophageal reflux disease)    Hepatitis A    as teenager 60's   Hollenhorst plaque    right eye   Hyperlipidemia    Hypertension    Kidney stones    hx of   Meniere's disease    Motility disorder, esophageal    Obesity    Pancreatitis    Personal history of colonic polyps 11/2004   hyperplastic.   Stroke [facility])    per  MRI    PSH: Past Surgical History:  Procedure Laterality Date   CATARACT EXTRACTION Bilateral 09/2015 and 10/2015   CHOLECYSTECTOMY     COLONOSCOPY     TOTAL KNEE ARTHROPLASTY Bilateral    both knees    SH: Social History   Tobacco Use   Smoking status: Never   Smokeless tobacco: Never  Substance Use Topics   Alcohol use: Not Currently   Drug use: No    ROS: Constitutional:  Negative for fever, chills, weight loss CV: Negative for chest pain, previous MI, hypertension Respiratory:  Negative for shortness of breath, wheezing, sleep apnea, frequent cough GI:  Negative for nausea, vomiting, bloody stool, GERD  PE: BP (!) 175/84   Pulse 83  GENERAL APPEARANCE:  Well appearing, well developed, well nourished, NAD HEENT:  Atraumatic,  normocephalic, oropharynx clear NECK:  Supple without lymphadenopathy or thyromegaly ABDOMEN:  Soft, non-tender, no masses EXTREMITIES:  Moves all extremities well, without clubbing, cyanosis, or edema NEUROLOGIC:  Alert and oriented x 3, normal gait, CN II-XII grossly intact MENTAL STATUS:  appropriate BACK:  Non-tender to palpation, No CVAT SKIN:  Warm, dry, and intact   Results: No results found for this or any previous visit (from the past 24 hour(s)).

## 2023-04-07 DIAGNOSIS — N35911 Unspecified urethral stricture, male, meatal: Secondary | ICD-10-CM | POA: Diagnosis not present

## 2023-04-07 DIAGNOSIS — R319 Hematuria, unspecified: Secondary | ICD-10-CM | POA: Diagnosis not present

## 2023-04-07 NOTE — Addendum Note (Signed)
Addended by: Carolin Coy on: 04/07/2023 09:07 AM   Modules accepted: Orders

## 2023-04-10 LAB — URINE CULTURE

## 2023-04-12 ENCOUNTER — Telehealth: Payer: Self-pay

## 2023-04-12 MED ORDER — NITROFURANTOIN MONOHYD MACRO 100 MG PO CAPS
100.0000 mg | ORAL_CAPSULE | Freq: Two times a day (BID) | ORAL | 0 refills | Status: DC
Start: 2023-04-12 — End: 2023-04-14

## 2023-04-12 NOTE — Telephone Encounter (Signed)
Pt aware of results and recommendations. Pt advised to get started as soon as possible to clear the infection prior to his cysto appt next week. Pt verbalized understanding.

## 2023-04-12 NOTE — Telephone Encounter (Signed)
Left msg for a return call from patient.  

## 2023-04-12 NOTE — Telephone Encounter (Signed)
-----   Message from [name], MD sent at 04/12/2023  8:51 AM EDT ----- Regarding: UTI Even though he was not symptomatic, since he is scheduled for cysto on 6/5 I want to treat his + culture.  Let him know I sent in antibiotic (macrobid) for him to take. Ryan Zhang

## 2023-04-12 NOTE — Addendum Note (Signed)
Addended by: [name] on: 04/12/2023 08:53 AM   Modules accepted: Orders

## 2023-04-13 DIAGNOSIS — J3089 Other allergic rhinitis: Secondary | ICD-10-CM | POA: Diagnosis not present

## 2023-04-13 DIAGNOSIS — J3081 Allergic rhinitis due to animal (cat) (dog) hair and dander: Secondary | ICD-10-CM | POA: Diagnosis not present

## 2023-04-13 DIAGNOSIS — J301 Allergic rhinitis due to pollen: Secondary | ICD-10-CM | POA: Diagnosis not present

## 2023-04-14 ENCOUNTER — Encounter: Payer: Self-pay | Admitting: Urology

## 2023-04-14 DIAGNOSIS — R1310 Dysphagia, unspecified: Secondary | ICD-10-CM | POA: Diagnosis not present

## 2023-04-14 DIAGNOSIS — R053 Chronic cough: Secondary | ICD-10-CM | POA: Diagnosis not present

## 2023-04-14 MED ORDER — NITROFURANTOIN MONOHYD MACRO 100 MG PO CAPS
100.0000 mg | ORAL_CAPSULE | Freq: Two times a day (BID) | ORAL | 0 refills | Status: DC
Start: 2023-04-14 — End: 2023-05-05

## 2023-04-14 NOTE — Addendum Note (Signed)
Addended by: [name] on: 04/14/2023 04:44 PM   Modules accepted: Orders

## 2023-04-19 DIAGNOSIS — J3089 Other allergic rhinitis: Secondary | ICD-10-CM | POA: Diagnosis not present

## 2023-04-19 DIAGNOSIS — J301 Allergic rhinitis due to pollen: Secondary | ICD-10-CM | POA: Diagnosis not present

## 2023-04-20 ENCOUNTER — Other Ambulatory Visit: Payer: Medicare Other | Admitting: Urology

## 2023-04-27 DIAGNOSIS — J301 Allergic rhinitis due to pollen: Secondary | ICD-10-CM | POA: Diagnosis not present

## 2023-04-27 DIAGNOSIS — J3089 Other allergic rhinitis: Secondary | ICD-10-CM | POA: Diagnosis not present

## 2023-04-27 DIAGNOSIS — J3081 Allergic rhinitis due to animal (cat) (dog) hair and dander: Secondary | ICD-10-CM | POA: Diagnosis not present

## 2023-05-04 ENCOUNTER — Other Ambulatory Visit: Payer: Self-pay

## 2023-05-04 ENCOUNTER — Emergency Department (HOSPITAL_BASED_OUTPATIENT_CLINIC_OR_DEPARTMENT_OTHER)
Admission: EM | Admit: 2023-05-04 | Discharge: 2023-05-05 | Disposition: A | Discharge status: Home / Self Care | Payer: Medicare Other | Attending: Emergency Medicine | Admitting: Emergency Medicine

## 2023-05-04 ENCOUNTER — Encounter (HOSPITAL_BASED_OUTPATIENT_CLINIC_OR_DEPARTMENT_OTHER): Payer: Self-pay

## 2023-05-04 DIAGNOSIS — E119 Type 2 diabetes mellitus without complications: Secondary | ICD-10-CM | POA: Insufficient documentation

## 2023-05-04 DIAGNOSIS — T18128A Food in esophagus causing other injury, initial encounter: Secondary | ICD-10-CM | POA: Diagnosis not present

## 2023-05-04 DIAGNOSIS — Z79899 Other long term (current) drug therapy: Secondary | ICD-10-CM | POA: Insufficient documentation

## 2023-05-04 DIAGNOSIS — T18108A Unspecified foreign body in esophagus causing other injury, initial encounter: Secondary | ICD-10-CM | POA: Diagnosis not present

## 2023-05-04 DIAGNOSIS — Z7984 Long term (current) use of oral hypoglycemic drugs: Secondary | ICD-10-CM | POA: Diagnosis not present

## 2023-05-04 DIAGNOSIS — I10 Essential (primary) hypertension: Secondary | ICD-10-CM | POA: Diagnosis not present

## 2023-05-04 DIAGNOSIS — Z794 Long term (current) use of insulin: Secondary | ICD-10-CM | POA: Diagnosis not present

## 2023-05-04 DIAGNOSIS — Z7902 Long term (current) use of antithrombotics/antiplatelets: Secondary | ICD-10-CM | POA: Diagnosis not present

## 2023-05-04 DIAGNOSIS — R1310 Dysphagia, unspecified: Secondary | ICD-10-CM | POA: Diagnosis present

## 2023-05-04 DIAGNOSIS — J3081 Allergic rhinitis due to animal (cat) (dog) hair and dander: Secondary | ICD-10-CM | POA: Diagnosis not present

## 2023-05-04 DIAGNOSIS — J3089 Other allergic rhinitis: Secondary | ICD-10-CM | POA: Diagnosis not present

## 2023-05-04 DIAGNOSIS — J301 Allergic rhinitis due to pollen: Secondary | ICD-10-CM | POA: Diagnosis not present

## 2023-05-04 DIAGNOSIS — X58XXXA Exposure to other specified factors, initial encounter: Secondary | ICD-10-CM | POA: Insufficient documentation

## 2023-05-04 NOTE — ED Triage Notes (Signed)
Pt arrives with c/o difficulty swallowing after eat some hot potatoes. Per pt, he went to take his pills after eating and he feels like there is one of his pills stuck in his throat. Pt NAD at this time. Pt able to keep fluids down.

## 2023-05-05 ENCOUNTER — Encounter: Payer: Self-pay | Admitting: Internal Medicine

## 2023-05-05 ENCOUNTER — Ambulatory Visit: Payer: Medicare Other | Admitting: Urology

## 2023-05-05 VITALS — BP 149/83 | HR 85

## 2023-05-05 DIAGNOSIS — R319 Hematuria, unspecified: Secondary | ICD-10-CM | POA: Diagnosis not present

## 2023-05-05 DIAGNOSIS — N35911 Unspecified urethral stricture, male, meatal: Secondary | ICD-10-CM | POA: Diagnosis not present

## 2023-05-05 MED ORDER — CEPHALEXIN 250 MG PO CAPS
500.0000 mg | ORAL_CAPSULE | Freq: Once | ORAL | Status: AC
Start: 2023-05-05 — End: 2023-05-05
  Administered 2023-05-05: 500 mg via ORAL

## 2023-05-05 NOTE — ED Provider Notes (Signed)
[facility] Provider Note   CSN: [id number] Arrival date & time: 05/04/23  2257     History  Chief Complaint  Patient presents with   Dysphagia    Ryan Zhang is a 77 y.o. male.  Patient is a 77 year old male with past medical history of type 2 diabetes, hypertension.  Patient presenting today with complaints of foreign body sensation in his throat.  He reports going out to eat earlier today.  He ordered a baked potato and when he took a bite of it and swallowed, realized it was very hot.  This caused a burn to the back of his throat.  This evening he attempted to take his nighttime medicines, then felt as though one of the pills became stuck in his throat.  He has been able to drink, but has not tried to eat.  He denies any difficulty breathing, but does have some discomfort with swallowing.  The history is provided by the patient.       Home Medications Prior to Admission medications   Medication Sig Start Date End Date Taking? Authorizing Provider  acetaminophen (TYLENOL) 325 MG tablet Take 2 tablets (650 mg total) by mouth every 6 (six) hours as needed (mild pain; headache). 02/06/20   Alwyn Ren, MD  albuterol (PROVENTIL) (2.5 MG/3ML) 0.083% nebulizer solution Take 3 mLs (2.5 mg total) by nebulization every 6 (six) hours as needed for wheezing or shortness of breath. 12/28/21   Wanda Plump, MD  albuterol (VENTOLIN HFA) 108 (90 Base) MCG/ACT inhaler Inhale 2 puffs into the lungs every 6 (six) hours as needed for wheezing or shortness of breath. 02/11/20   Wanda Plump, MD  augmented betamethasone dipropionate (DIPROLENE-AF) 0.05 % cream Apply topically 2 (two) times daily. 04/19/22   Wanda Plump, MD  budesonide-formoterol [facility]) 160-4.5 MCG/ACT inhaler Inhale 2 puffs into the lungs 2 (two) times daily.     [provider]  buPROPion (WELLBUTRIN XL) 150 MG 24 hr tablet Take 150 mg by mouth daily. 02/15/21   [provider]  clonazePAM (KLONOPIN) 0.5 MG tablet Take 0.5 mg by mouth See admin instructions. #2 at 8am, #1 at 2pm [facility])    [provider]  clopidogrel (PLAVIX) 75 MG tablet Take 1 tablet (75 mg total) by mouth daily. 11/29/22   Wanda Plump, MD  DULoxetine (CYMBALTA) 60 MG capsule Take 60 mg by mouth every morning.    [provider]  EPIPEN 2-PAK 0.3 MG/0.3ML SOAJ injection Reported on 04/29/2016 07/04/14   [provider]  esomeprazole (NEXIUM) 40 MG capsule Take 40 mg by mouth every morning.     [provider]  fexofenadine (ALLEGRA) 180 MG tablet Take 180 mg by mouth every morning. 8am [name])    [provider]  fluticasone (VERAMYST) 27.5 MCG/SPRAY nasal spray Place 2 sprays into the nose daily as needed for rhinitis or allergies.     [provider]  lamoTRIgine (LAMICTAL) 25 MG tablet Take 50 mg by mouth at bedtime.    [provider]  Lancets Letta Pate ULTRASOFT) lancets Check blood sugars no more than twice daily 12/09/16   Wanda Plump, MD  losartan (COZAAR) 25 MG tablet Take 1 tablet (25 mg total) by mouth every evening. 10/21/22   Wanda Plump, MD  montelukast (SINGULAIR) 10 MG tablet Take 10 mg by mouth at bedtime. [name]    [provider]  Multiple Vitamin (  MULTIVITAMIN) tablet Take 1 tablet by mouth daily.     [provider]  nitrofurantoin, macrocrystal-monohydrate, (MACROBID) 100 MG capsule Take 1 capsule (100 mg total) by mouth every 12 (twelve) hours. 04/14/23   [name], MD  nystatin (MYCOSTATIN/NYSTOP) powder Apply 1 application. topically 3 (three) times daily.    [provider]  nystatin ointment (MYCOSTATIN) APPLY TOPICALLY TWICE A DAY AS NEEDED 08/23/22   Wanda Plump, MD  [facility] VERIO test strip use to check blood sugar no more than twice daily 12/03/19   Wanda Plump, MD  pioglitazone-metformin (ACTOPLUS MET) [phone] MG tablet Take 1 tablet by mouth 2 (two) times  daily with a meal. 11/29/22   Wanda Plump, MD  simvastatin (ZOCOR) 40 MG tablet Take 1 tablet (40 mg total) by mouth at bedtime. 11/16/22   Wanda Plump, MD  solifenacin (VESICARE) 10 MG tablet Take 1 tablet (10 mg total) by mouth daily. 11/26/20   Wanda Plump, MD      Allergies    Celexa  [citalopram hydrobromide], Depakote  [divalproex sodium], Methocarbamol, Paroxetine hcl, Smz-tmp ds [sulfamethoxazole-trimethoprim], Other, Oxycodone-acetaminophen, and Risperidone    Review of Systems   Review of Systems  All other systems reviewed and are negative.   Physical Exam Updated Vital Signs BP 123/72 (BP Location: Left Arm)   Pulse 94   Temp 98 F (36.7 C)   Resp 20   Ht 5\' 10"  (1.778 m)   Wt 106.6 kg   SpO2 95%   BMI 33.72 kg/m  Physical Exam Vitals and nursing note reviewed.  Constitutional:      General: He is not in acute distress.    Appearance: He is well-developed. He is not diaphoretic.  HENT:     Head: Normocephalic and atraumatic.     Mouth/Throat:     Mouth: Mucous membranes are moist.     Pharynx: Oropharynx is clear.  Cardiovascular:     Rate and Rhythm: Normal rate and regular rhythm.     Heart sounds: No murmur heard.    No friction rub.  Pulmonary:     Effort: Pulmonary effort is normal. No respiratory distress.     Breath sounds: Normal breath sounds. No wheezing or rales.  Abdominal:     General: Bowel sounds are normal. There is no distension.     Palpations: Abdomen is soft.     Tenderness: There is no abdominal tenderness.  Musculoskeletal:        General: Normal range of motion.     Cervical back: Normal range of motion and neck supple.  Skin:    General: Skin is warm and dry.  Neurological:     Mental Status: He is alert and oriented to person, place, and time.     Coordination: Coordination normal.     ED Results / Procedures / Treatments   Labs (all labs ordered are listed, but only abnormal results are displayed) Labs Reviewed - No data to  display  EKG None  Radiology No results found.  Procedures Procedures    Medications Ordered in ED Medications - No data to display  ED Course/ Medical Decision Making/ A&P  Patient is a 77 year old male with with past medical history as per HPI presenting with the sensation of a pill being stuck in his throat.  He reports burning his throat with a hot potato earlier this afternoon, then try to take his medications this evening and it would not go down.  Patient has drank fluids here and it appears now that the pill has dissolved.  He is now feeling much better.  Patient to be discharged with as needed return.  Final Clinical Impression(s) / ED Diagnoses Final diagnoses:  None    Rx / DC Orders ED Discharge Orders     None         Geoffery Lyons, MD 05/05/23 0150

## 2023-05-05 NOTE — Addendum Note (Signed)
Addended by: Carolin Coy on: 05/05/2023 03:21 PM   Modules accepted: Orders

## 2023-05-05 NOTE — Discharge Instructions (Signed)
Return to the ER if you experience any new and/or concerning symptoms. 

## 2023-05-05 NOTE — Progress Notes (Signed)
Assessment: 1. 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 Hematuria, unspecified type     Plan: Today I discussed with the patient and his wife my inability to access his urethral meatus due to a combination of factors especially his phimosis and BXO.  I discussed with him that to perform cystoscopy he would need at least a dorsal slit and revision of his prior circumcision just to access his urethral meatus which then may require dilation.  Pros and cons of this were discussed in detail and at this time the patient does not want to proceed with further intervention.  He will let me know if there are any changes.  Follow-up as scheduled.  Chief Complaint: Penile stricture  HPI: Ryan Zhang is a 77 y.o. male who presents for planned cysto given recent hx of passing a blood clott. Please see my note 01/06/2023 at the time of his initial visit for detailed history. Patient has been followed for many years at [facility] urology.  Patient on macrobid to cover + culture with klebsiella.  LUTS stable on vesicare. No further passage of clotts.  Portions of the above documentation were copied from a prior visit for review purposes only.  Allergies: Allergies  Allergen Reactions   Celexa  [Citalopram Hydrobromide] Other (See Comments)   Depakote  [Divalproex Sodium] Other (See Comments)   Methocarbamol     Other reaction(s): Hallucinations   Paroxetine Hcl     Other reaction(s): Insomnia   Smz-Tmp Ds [Sulfamethoxazole-Trimethoprim] Nausea And Vomiting   Other     "Symbrax" alteration in blood sugar, pt unsure if low or high blood sugar   Oxycodone-Acetaminophen     Other reaction(s): Unknown   Risperidone Other (See Comments)    REACTION: hyper    PMH: Past Medical History:  Diagnosis Date   Allergy    allergy shots Dr. Corinda Gubler   Anemia    Anxiety    Asthma    moderate persistant   Bipolar affective (HCC)    Cataract    both eyes   COPD (chronic  obstructive pulmonary disease) (HCC)    Depression    Diabetes mellitus    DJD (degenerative joint disease)    Dysphagia    Gallstones    hx of, s/p cholecystectomy   GERD (gastroesophageal reflux disease)    Hepatitis A    as teenager 60's   Hollenhorst plaque    right eye   Hyperlipidemia    Hypertension    Kidney stones    hx of   Meniere's disease    Motility disorder, esophageal    Obesity    Pancreatitis    Personal history of colonic polyps 11/2004   hyperplastic.   Stroke [facility])    per MRI    PSH: Past Surgical History:  Procedure Laterality Date   CATARACT EXTRACTION Bilateral 09/2015 and 10/2015   CHOLECYSTECTOMY     COLONOSCOPY     TOTAL KNEE ARTHROPLASTY Bilateral    both knees    SH: Social History   Tobacco Use   Smoking status: Never   Smokeless tobacco: Never  Substance Use Topics   Alcohol use: Not Currently   Drug use: No    ROS: Constitutional:  Negative for fever, chills, weight loss CV: Negative for chest pain, previous MI, hypertension Respiratory:  Negative for shortness of breath, wheezing, sleep apnea, frequent cough GI:  Negative for nausea, vomiting, bloody stool, GERD  PE: Vitals:   05/05/23  1321  BP: (!) 149/83  Pulse: 85    GENERAL APPEARANCE:  Elderly wm with poor performance status, NAD    Procedure:  Male cystoscopy  No performed due to inability to access urethral meatus due to bxo /phimosis

## 2023-05-05 NOTE — Addendum Note (Signed)
Addended by: Carolin Coy on: 05/05/2023 03:40 PM   Modules accepted: Orders

## 2023-05-06 ENCOUNTER — Ambulatory Visit: Payer: Medicare Other | Attending: Physician Assistant | Admitting: Speech Pathology

## 2023-05-06 ENCOUNTER — Encounter: Payer: Self-pay | Admitting: Speech Pathology

## 2023-05-06 DIAGNOSIS — R1310 Dysphagia, unspecified: Secondary | ICD-10-CM | POA: Diagnosis not present

## 2023-05-06 NOTE — Therapy (Signed)
OUTPATIENT SPEECH LANGUAGE PATHOLOGY SWALLOW EVALUATION   Patient Name: Ryan Zhang MRN: [id number] DOB:[birth year], 77 y.o., male Today's Date: 05/06/2023  PCP: Wanda Plump , MD REFERRING PROVIDER: Vivianne Master  END OF SESSION:  End of Session - 05/06/23 1111     Visit Number 1    Number of Visits 17    Date for SLP Re-Evaluation 07/06/23    SLP Start Time 1105    SLP Stop Time  1148    SLP Time Calculation (min) 43 min    Activity Tolerance Patient tolerated treatment well             Past Medical History:  Diagnosis Date   Allergy    allergy shots Dr. Corinda Gubler   Anemia    Anxiety    Asthma    moderate persistant   Bipolar affective (HCC)    Cataract    both eyes   COPD (chronic obstructive pulmonary disease) (HCC)    Depression    Diabetes mellitus    DJD (degenerative joint disease)    Dysphagia    Gallstones    hx of, s/p cholecystectomy   GERD (gastroesophageal reflux disease)    Hepatitis A    as teenager 60's   Hollenhorst plaque    right eye   Hyperlipidemia    Hypertension    Kidney stones    hx of   Meniere's disease    Motility disorder, esophageal    Obesity    Pancreatitis    Personal history of colonic polyps 11/2004   hyperplastic.   Stroke [facility])    per MRI   Past Surgical History:  Procedure Laterality Date   CATARACT EXTRACTION Bilateral 09/2015 and 10/2015   CHOLECYSTECTOMY     COLONOSCOPY     TOTAL KNEE ARTHROPLASTY Bilateral    both knees   Patient Active Problem List   Diagnosis Date Noted   Tinea cruris 07/24/2020   PCP NOTES >>>>>>>>>>>>>>>>>>>>>>>>>>>>>>> 01/12/2016   Dry skin 05/09/2015   Folliculitis 05/09/2015   Hearing loss in right ear 08/05/2014   Pre-ulcerative corn or callous 12/27/2013   Panic attack as reaction to stress 04/20/2012   Dysphagia, neurologic 01/07/2012   Morbid obesity (HCC) 01/07/2012   Chest pain, musculoskeletal 12/07/2011   Black-out (not amnesia) 12/07/2011   Candidiasis of  skin 08/12/2011   Annual physical exam 06/07/2011   OLECRANON BURSITIS 01/29/2011   PARTIAL ARTERIAL OCCLUSION OF RETINA 05/27/2010   Essential hypertension, benign 01/16/2010   ABNORMAL ELECTROCARDIOGRAM 01/14/2010   Asthma 10/14/2009   GERD 10/14/2009   Bipolar disorder (HCC) 02/20/2009   PHIMOSIS 02/20/2009   KNEE PAIN, CHRONIC 11/20/2008   HIP PAIN, RIGHT 08/19/2008   DM type 2 with diabetic peripheral neuropathy (HCC) 02/22/2007   Hyperlipidemia associated with type 2 diabetes mellitus (HCC) 02/22/2007   ALLERGIC RHINITIS, SEASONAL 02/22/2007   LOW BACK PAIN, CHRONIC 02/22/2007   MENIERE'S DISEASE, HX OF 02/22/2007    ONSET DATE: 2015   REFERRING DIAG:   R13.10 (ICD-10-CM) - Dysphagia, unspecified    THERAPY DIAG:  Dysphagia, unspecified type  Rationale for Evaluation and Treatment: Habilitation  SUBJECTIVE:   SUBJECTIVE STATEMENT: Pt was pleasant and cooperative througohut evaluation Pt accompanied by: significant other, sharon  PERTINENT HISTORY: COPD  PAIN:  Are you having pain? Yes: NPRS scale: 3/10 Pain location: Knee Pain description: aching Aggravating factors: no Relieving factors: movement  FALLS: Has patient fallen in last 6 months?  Yes, Number of falls: Several times a  week   LIVING ENVIRONMENT: Lives with: lives with their family Lives in: House/apartment  PLOF:  Level of assistance: Independent with ADLs, Independent with IADLs Employment: Retired  PATIENT GOALS: swallow safety  OBJECTIVE:   DIAGNOSTIC FINDINGS:   FINDINGS: Vestibular Penetration:  Seen with nectar thick swallows.   Aspiration: Seen with thin barium swallows, predominantly with routine swallows. To a lesser degree with chin tuck maneuver.   Other: Barium tablet became hung in the vallecula. Would not pass or clear despite multiple additional swallows of puree and nectar thick liquids. Eventually patient expectorated tablet out.   IMPRESSION: Aspiration seen  with swallowing of thin barium. Vestibular penetration seen with nectar thick liquids.   Please refer to the Speech Pathologists report for complete details and recommendations.   COGNITION: Overall cognitive status: History of cognitive impairments - at baseline Areas of impairment:  Attention: Impaired: Comment: reported deficits Memory: Impaired: reported deficits Executive function: Impaired: Comment: reported deficits Functional deficits: Not to be addressed at this time  ORAL MOTOR EXAMINATION: Overall status: WFL Comments: NA  CLINICAL SWALLOW ASSESSMENT:   Current diet: regular and thin liquids Dentition: adequate natural dentition Patient directly observed with POs: No; to observe next session Feeding: able to feed self Liquids provided by:  next session Oral phase signs and symptoms:  next session Pharyngeal phase signs and symptoms:  to see next session  PATIENT REPORTED OUTCOME MEASURES (PROM): EAT-10: 14   TODAY'S TREATMENT:                                                                                                                                         DATE:   PATIENT EDUCATION: Education details: dysphagia, MBS results Person educated: Patient and Spouse Education method: Explanation and Handouts Education comprehension: needs further education   ASSESSMENT:  CLINICAL IMPRESSION: Pt is a 77 yo male who presents to ST OP for evaluation post referral from ENT. Pt endorses difficulty swallowing since 2015. Reports hx of CVA in 2013; however, no residual deficits. He has difficulty swallowing solids, liquids, and pills. Pt and wife feel like they were not provided adequate education regarding swallowing deficits. SLP printed MBS report and provided thorough education regarding his moderate pharyngeal dysphagia diagnosis. To cont next session. Pt swallowing to be assessed next session - pt to bring in food and liquid. SLP rec skilled ST services to address  dysphagia.   OBJECTIVE IMPAIRMENTS: include dysphagia. These impairments are limiting patient from safety when swallowing. Factors affecting potential to achieve goals and functional outcome are  na . Patient will benefit from skilled SLP services to address above impairments and improve overall function.  REHAB POTENTIAL: Good   GOALS: Goals reviewed with patient? Yes  SHORT TERM GOALS: Target date: 06/05/23  Pt/wife will recall 3 swallow strategies to increase patient safety while eating. Baseline: Goal status: INITIAL  2.  Pt will tolerate regular diet  without overt s/x of aspiration when following safe swallow strategies. Baseline:  Goal status: INITIAL    LONG TERM GOALS: Target date: 07/06/23  Pt will increase score on EAT-10. Baseline:  Goal status: INITIAL  2.  Pt/wife will report increased swallow safety and less coughing events while eating. Baseline:  Goal status: INITIAL    PLAN:  SLP FREQUENCY: 2x/week  SLP DURATION: 8 weeks  PLANNED INTERVENTIONS: Aspiration precaution training, Pharyngeal strengthening exercises, Diet toleration management , SLP instruction and feedback, Compensatory strategies, and Patient/family education    [address], CCC-SLP 05/06/2023, 11:55 AM

## 2023-05-07 ENCOUNTER — Other Ambulatory Visit: Payer: Self-pay | Admitting: Internal Medicine

## 2023-05-11 DIAGNOSIS — J301 Allergic rhinitis due to pollen: Secondary | ICD-10-CM | POA: Diagnosis not present

## 2023-05-11 DIAGNOSIS — J3089 Other allergic rhinitis: Secondary | ICD-10-CM | POA: Diagnosis not present

## 2023-05-13 ENCOUNTER — Ambulatory Visit (INDEPENDENT_AMBULATORY_CARE_PROVIDER_SITE_OTHER): Payer: Medicare Other | Admitting: *Deleted

## 2023-05-13 DIAGNOSIS — Z0000 Encounter for general adult medical examination without abnormal findings: Secondary | ICD-10-CM

## 2023-05-13 NOTE — Progress Notes (Signed)
Subjective:   Ryan Zhang is a 77 y.o. male who presents for Medicare Annual/Subsequent preventive examination.  Visit Complete: Virtual  I connected with  Ryan Zhang on 05/13/23 by a audio enabled telemedicine application and verified that I am speaking with the correct person using two identifiers.  Patient Location: Home  Provider Location: Office/Clinic  I discussed the limitations of evaluation and management by telemedicine. The patient expressed understanding and agreed to proceed.  Patient Medicare AWV questionnaire was completed by the patient on 05/06/23; I have confirmed that all information answered by patient is correct and no changes since this date.  Review of Systems     Cardiac Risk Factors include: advanced age (>17men, >23 women);male gender;obesity (BMI >30kg/m2);dyslipidemia;diabetes mellitus;hypertension     Objective:    Today's Vitals   05/06/23 0802  PainSc: 0-No pain   There is no height or weight on file to calculate BMI.     05/13/2023    3:45 PM 05/06/2023   11:09 AM 05/04/2023   11:07 PM 01/29/2023    3:39 PM 05/10/2022    3:34 PM 04/28/2021    2:25 PM 10/11/2020    3:39 PM  Advanced Directives  Does Patient Have a Medical Advance Directive? Yes Yes No No Yes Yes Yes  Type of Estate agent of [place];Living will Healthcare Power of [place];Living will;Out of facility DNR (pink MOST or yellow form)   Healthcare Power of [employer] of [place];Living will   Does patient want to make changes to medical advance directive? No - Patient declined No - Patient declined       Copy of Healthcare Power of Attorney in Chart? Yes - validated most recent copy scanned in chart (See row information) No - copy requested   Yes - validated most recent copy scanned in chart (See row information) No - copy requested   Would patient like information on creating a medical advance directive?  No - Guardian declined No - Guardian  declined        Current Medications (verified) Outpatient Encounter Medications as of 05/13/2023  Medication Sig   acetaminophen (TYLENOL) 325 MG tablet Take 2 tablets (650 mg total) by mouth every 6 (six) hours as needed (mild pain; headache).   albuterol (PROVENTIL) (2.5 MG/3ML) 0.083% nebulizer solution Take 3 mLs (2.5 mg total) by nebulization every 6 (six) hours as needed for wheezing or shortness of breath.   albuterol (VENTOLIN HFA) 108 (90 Base) MCG/ACT inhaler Inhale 2 puffs into the lungs every 6 (six) hours as needed for wheezing or shortness of breath.   augmented betamethasone dipropionate (DIPROLENE-AF) 0.05 % cream Apply topically 2 (two) times daily.   budesonide-formoterol (SYMBICORT) 160-4.5 MCG/ACT inhaler Inhale 2 puffs into the lungs 2 (two) times daily.    buPROPion (WELLBUTRIN XL) 150 MG 24 hr tablet Take 150 mg by mouth daily.   clonazePAM (KLONOPIN) 0.5 MG tablet Take 0.5 mg by mouth See admin instructions. #2 at 8am, #1 at 2pm [facility])   clopidogrel (PLAVIX) 75 MG tablet Take 1 tablet (75 mg total) by mouth daily.   DULoxetine (CYMBALTA) 60 MG capsule Take 60 mg by mouth every morning.   EPIPEN 2-PAK 0.3 MG/0.3ML SOAJ injection Reported on 04/29/2016   esomeprazole (NEXIUM) 40 MG capsule Take 40 mg by mouth every morning.    fexofenadine (ALLEGRA) 180 MG tablet Take 180 mg by mouth every morning. 8am [name])   fluticasone (VERAMYST) 27.5 MCG/SPRAY nasal spray Place 2  sprays into the nose daily as needed for rhinitis or allergies.    lamoTRIgine (LAMICTAL) 25 MG tablet Take 50 mg by mouth at bedtime.   Lancets (ONETOUCH ULTRASOFT) lancets Check blood sugars no more than twice daily   losartan (COZAAR) 25 MG tablet Take 1 tablet (25 mg total) by mouth every evening.   montelukast (SINGULAIR) 10 MG tablet Take 10 mg by mouth at bedtime. [name]   Multiple Vitamin (MULTIVITAMIN) tablet Take 1 tablet by mouth daily.    nystatin (MYCOSTATIN/NYSTOP) powder Apply 1  application. topically 3 (three) times daily.   nystatin ointment (MYCOSTATIN) APPLY TOPICALLY TWICE A DAY AS NEEDED   ONETOUCH VERIO test strip use to check blood sugar no more than twice daily   pioglitazone-metformin (ACTOPLUS MET) 15-850 MG tablet Take 1 tablet by mouth 2 (two) times daily with a meal.   simvastatin (ZOCOR) 40 MG tablet Take 1 tablet (40 mg total) by mouth at bedtime.   solifenacin (VESICARE) 10 MG tablet Take 1 tablet (10 mg total) by mouth daily.   No facility-administered encounter medications on file as of 05/13/2023.    Allergies (verified) Celexa  [citalopram hydrobromide], Depakote  [divalproex sodium], Methocarbamol, Paroxetine hcl, Smz-tmp ds [sulfamethoxazole-trimethoprim], Other, Oxycodone-acetaminophen, and Risperidone   History: Past Medical History:  Diagnosis Date   Allergy    allergy shots Dr. Corinda Gubler   Anemia    Anxiety    Asthma    moderate persistant   Bipolar affective (HCC)    Cataract    both eyes   COPD (chronic obstructive pulmonary disease) (HCC)    Depression    Diabetes mellitus    DJD (degenerative joint disease)    Dysphagia    Gallstones    hx of, s/p cholecystectomy   GERD (gastroesophageal reflux disease)    Hepatitis A    as teenager 60's   Hollenhorst plaque    right eye   Hyperlipidemia    Hypertension    Kidney stones    hx of   Meniere's disease    Motility disorder, esophageal    Obesity    Pancreatitis    Personal history of colonic polyps 11/2004   hyperplastic.   Stroke [facility])    per MRI   Past Surgical History:  Procedure Laterality Date   CATARACT EXTRACTION Bilateral 09/2015 and 10/2015   CHOLECYSTECTOMY     COLONOSCOPY     TOTAL KNEE ARTHROPLASTY Bilateral    both knees   Family History  Problem Relation Age of Onset   Diabetes Father    Heart disease Brother    Heart disease Maternal Grandmother    Cancer Sister        unknown type   Colon cancer Neg Hx    Prostate cancer Neg Hx    Colon  polyps Neg Hx    Esophageal cancer Neg Hx    Rectal cancer Neg Hx    Stomach cancer Neg Hx    Social History   Socioeconomic History   Marital status: Married    Spouse name: Not on file   Number of children: 1   Years of education: 17   Highest education level: Bachelor's degree (e.g., BA, AB, BS)  Occupational History   Occupation: disabled (due to s/e of ECT)    Employer: DISABLED    Comment: laboratory computing/programming  Tobacco Use   Smoking status: Never   Smokeless tobacco: Never  Substance and Sexual Activity   Alcohol use: Not Currently  Drug use: No   Sexual activity: Not Currently  Other Topics Concern   Not on file  Social History Narrative   - Household-- pt , wife   - adopted  daughter (h/o substance abuse,on a methadone program, doing better )   - disability d/t ECT (2007) - was left w/ cognitive problems    Social Determinants of Health   Financial Resource Strain: Low Risk  (05/06/2023)   Overall Financial Resource Strain (CARDIA)    Difficulty of Paying Living Expenses: Not hard at all  Food Insecurity: No Food Insecurity (05/06/2023)   Hunger Vital Sign    Worried About Running Out of Food in the Last Year: Never true    Ran Out of Food in the Last Year: Never true  Transportation Needs: No Transportation Needs (05/06/2023)   PRAPARE - Administrator, Civil Service (Medical): No    Lack of Transportation (Non-Medical): No  Physical Activity: Patient Declined (05/06/2023)   Exercise Vital Sign    Days of Exercise per Week: Patient declined    Minutes of Exercise per Session: Patient declined  Stress: Stress Concern Present (05/06/2023)   [employer] of Occupational Health - Occupational Stress Questionnaire    Feeling of Stress : To some extent  Social Connections: Moderately Isolated (05/06/2023)   Social Connection and Isolation Panel [NHANES]    Frequency of Communication with Friends and Family: Never    Frequency of Social  Gatherings with Friends and Family: Never    Attends Religious Services: 1 to 4 times per year    Active Member of [employer] or Organizations: No    Attends Engineer, structural: Never    Marital Status: Married    Tobacco Counseling Counseling given: Not Answered   Clinical Intake:  Pre-visit preparation completed: Yes  Pain : No/denies pain Pain Score: 0-No pain  Nutritional Risks: None Diabetes: Yes CBG done?: No Did pt. bring in CBG monitor from home?: No  How often do you need to have someone help you when you read instructions, pamphlets, or other written materials from your doctor or pharmacy?: 1 - Never  Interpreter Needed?: No  Information entered by :: Donne Anon, CMA   Activities of Daily Living    05/06/2023    8:02 AM  In your present state of health, do you have any difficulty performing the following activities:  Hearing? 1  Vision? 0  Difficulty concentrating or making decisions? 1  Walking or climbing stairs? 1  Dressing or bathing? 0  Doing errands, shopping? 0  Preparing Food and eating ? N  Using the Toilet? N  In the past six months, have you accidently leaked urine? Y  Do you have problems with loss of bowel control? N  Managing your Medications? N  Managing your Finances? N  Housekeeping or managing your Housekeeping? N    Patient Care Team: Wanda Plump, MD as PCP - General (Internal Medicine) [name], Enzo Montgomery, MD as Consulting Physician (Allergy and Immunology) Archer Asa, MD as Consulting Physician (Psychiatry) Nelson Chimes, MD as Consulting Physician (Ophthalmology)  Indicate any recent Medical Services you may have received from other than [facility] providers in the past year (date may be approximate).     Assessment:   This is a routine wellness examination for Ryan Zhang.  Hearing/Vision screen No results found.  Dietary issues and exercise activities discussed:     Goals Addressed   None    Depression  Screen  05/13/2023    3:52 PM 12/21/2022   10:06 AM 10/05/2022    3:44 PM 08/16/2022   10:11 AM 05/10/2022    3:40 PM 03/15/2022   10:41 AM 04/28/2021    2:30 PM  PHQ 2/9 Scores  PHQ - 2 Score 0 0 0 0 0 0 1  PHQ- 9 Score  2 2   2      Fall Risk    05/06/2023    8:02 AM 12/21/2022   10:05 AM 10/05/2022    3:44 PM 08/16/2022   10:11 AM 05/10/2022    3:35 PM  Fall Risk   Falls in the past year? 1 1 1 1 1   Number falls in past yr: 1 1 0 0 1  Injury with Fall? 1 0 1 1 0  Risk for fall due to : History of fall(s);Impaired balance/gait Impaired balance/gait   History of fall(s)  Follow up Falls evaluation completed Falls evaluation completed Falls evaluation completed Falls evaluation completed Falls prevention discussed    MEDICARE RISK AT HOME:   TIMED UP AND GO:  Was the test performed?  No    Cognitive Function:    02/13/2019    3:35 PM 02/09/2017    3:01 PM  MMSE - Mini Mental State Exam  Not completed: Unable to complete   Orientation to time  5  Orientation to Place  5  Registration  3  Attention/ Calculation  5  Recall  3  Language- name 2 objects  2  Language- repeat  1  Language- follow 3 step command  3  Language- read & follow direction  1  Write a sentence  1  Copy design  1  Total score  30        05/13/2023    3:55 PM  6CIT Screen  What Year? 0 points  What month? 0 points  What time? 0 points  Count back from 20 0 points  Months in reverse 0 points  Repeat phrase 0 points  Total Score 0 points    Immunizations Immunization History  Administered Date(s) Administered   Fluad Quad(high Dose 65+) 07/12/2019, 08/16/2022   H1N1 11/14/2008   Influenza Split 08/12/2011, 08/03/2012   Influenza Whole 08/22/2009, 08/17/2010   Influenza, High Dose Seasonal PF 08/28/2018   Influenza,inj,Quad PF,6+ Mos 09/25/2013, 10/14/2014, 09/25/2015   Influenza-Unspecified 08/04/2016, 07/22/2017, 09/04/2020, 07/30/2021   PFIZER(Purple Top)SARS-COV-2 Vaccination  12/23/2019, 01/16/2020, 09/04/2020, 05/08/2021   PNEUMOCOCCAL CONJUGATE-20 03/15/2022   Pfizer Covid-19 Vaccine Bivalent Booster 61yrs & up 09/02/2021, 08/18/2022   Pneumococcal Conjugate-13 02/12/2015   Pneumococcal Polysaccharide-23 11/15/2008, 12/27/2013   Respiratory Syncytial Virus Vaccine,Recomb Aduvanted(Arexvy) 08/18/2022   Td 08/22/2009, 07/20/2010, 12/15/2016   Zoster Recombinat (Shingrix) 03/16/2017, 09/15/2017   Zoster, Live 04/20/2012    TDAP status: Up to date  Flu Vaccine status: Up to date  Pneumococcal vaccine status: Up to date  Covid-19 vaccine status: Information provided on how to obtain vaccines.   Qualifies for Shingles Vaccine? Yes   Zostavax completed Yes   Shingrix Completed?: Yes  Screening Tests Health Maintenance  Topic Date Due   Medicare Annual Wellness (AWV)  05/11/2023   COVID-19 Vaccine (7 - 2023-24 season) 06/20/2023 (Originally 10/13/2022)   INFLUENZA VACCINE  06/16/2023   HEMOGLOBIN A1C  06/21/2023   OPHTHALMOLOGY EXAM  09/03/2023   Diabetic kidney evaluation - Urine ACR  12/22/2023   FOOT EXAM  12/22/2023   Diabetic kidney evaluation - eGFR measurement  01/29/2024   DTaP/Tdap/Td (4 - Tdap) 12/15/2026  Pneumonia Vaccine 49+ Years old  Completed   Hepatitis C Screening  Completed   Zoster Vaccines- Shingrix  Completed   HPV VACCINES  Aged Out    Health Maintenance  Health Maintenance Due  Topic Date Due   Medicare Annual Wellness (AWV)  05/11/2023    Colorectal cancer screening: No longer required.   Lung Cancer Screening: (Low Dose CT Chest recommended if Age 37-80 years, 20 pack-year currently smoking OR have quit w/in 15years.) does not qualify.   Additional Screening:  Hepatitis C Screening: does qualify; Completed 05/13/16  Vision Screening: Recommended annual ophthalmology exams for early detection of glaucoma and other disorders of the eye. Is the patient up to date with their annual eye exam?  Yes  Who is the  provider or what is the name of the office in which the patient attends annual eye exams? [facility]. If pt is not established with a provider, would they like to be referred to a provider to establish care? No .   Dental Screening: Recommended annual dental exams for proper oral hygiene  Diabetic Foot Exam: Diabetic Foot Exam: Completed 12/21/22  Community Resource Referral / Chronic Care Management: CRR required this visit?  No   CCM required this visit?  No     Plan:     I have personally reviewed and noted the following in the patient's chart:   Medical and social history Use of alcohol, tobacco or illicit drugs  Current medications and supplements including opioid prescriptions. Patient is not currently taking opioid prescriptions. Functional ability and status Nutritional status Physical activity Advanced directives List of other physicians Hospitalizations, surgeries, and ER visits in previous 12 months Vitals Screenings to include cognitive, depression, and falls Referrals and appointments  In addition, I have reviewed and discussed with patient certain preventive protocols, quality metrics, and best practice recommendations. A written personalized care plan for preventive services as well as general preventive health recommendations were provided to patient.     Donne Anon, CMA   05/13/2023   After Visit Summary: (MyChart) Due to this being a telephonic visit, the after visit summary with patients personalized plan was offered to patient via MyChart   Nurse Notes: None

## 2023-05-13 NOTE — Patient Instructions (Signed)
Mr. Ryan Zhang , Thank you for taking time to come for your Medicare Wellness Visit. I appreciate your ongoing commitment to your health goals. Please review the following plan we discussed and let me know if I can assist you in the future.     This is a list of the screening recommended for you and due dates:  Health Maintenance  Topic Date Due   COVID-19 Vaccine (7 - 2023-24 season) 06/20/2023*   Flu Shot  06/16/2023   Hemoglobin A1C  06/21/2023   Eye exam for diabetics  09/03/2023   Yearly kidney health urinalysis for diabetes  12/22/2023   Complete foot exam   12/22/2023   Yearly kidney function blood test for diabetes  01/29/2024   Medicare Annual Wellness Visit  05/12/2024   DTaP/Tdap/Td vaccine (4 - Tdap) 12/15/2026   Pneumonia Vaccine  Completed   Hepatitis C Screening  Completed   Zoster (Shingles) Vaccine  Completed   HPV Vaccine  Aged Out  *Topic was postponed. The date shown is not the original due date.    Next appointment: Follow up in one year for your annual wellness visit.   Preventive Care 40 Years and Older, Male Preventive care refers to lifestyle choices and visits with your health care provider that can promote health and wellness. What does preventive care include? A yearly physical exam. This is also called an annual well check. Dental exams once or twice a year. Routine eye exams. Ask your health care provider how often you should have your eyes checked. Personal lifestyle choices, including: Daily care of your teeth and gums. Regular physical activity. Eating a healthy diet. Avoiding tobacco and drug use. Limiting alcohol use. Practicing safe sex. Taking low doses of aspirin every day. Taking vitamin and mineral supplements as recommended by your health care provider. What happens during an annual well check? The services and screenings done by your health care provider during your annual well check will depend on your age, overall health, lifestyle  risk factors, and family history of disease. Counseling  Your health care provider may ask you questions about your: Alcohol use. Tobacco use. Drug use. Emotional well-being. Home and relationship well-being. Sexual activity. Eating habits. History of falls. Memory and ability to understand (cognition). Work and work Astronomer. Screening  You may have the following tests or measurements: Height, weight, and BMI. Blood pressure. Lipid and cholesterol levels. These may be checked every 5 years, or more frequently if you are over 67 years old. Skin check. Lung cancer screening. You may have this screening every year starting at age 65 if you have a 30-pack-year history of smoking and currently smoke or have quit within the past 15 years. Fecal occult blood test (FOBT) of the stool. You may have this test every year starting at age 71. Flexible sigmoidoscopy or colonoscopy. You may have a sigmoidoscopy every 5 years or a colonoscopy every 10 years starting at age 58. Prostate cancer screening. Recommendations will vary depending on your family history and other risks. Hepatitis C blood test. Hepatitis B blood test. Sexually transmitted disease (STD) testing. Diabetes screening. This is done by checking your blood sugar (glucose) after you have not eaten for a while (fasting). You may have this done every 1-3 years. Abdominal aortic aneurysm (AAA) screening. You may need this if you are a current or former smoker. Osteoporosis. You may be screened starting at age 8 if you are at high risk. Talk with your health care provider about your test  results, treatment options, and if necessary, the need for more tests. Vaccines  Your health care provider may recommend certain vaccines, such as: Influenza vaccine. This is recommended every year. Tetanus, diphtheria, and acellular pertussis (Tdap, Td) vaccine. You may need a Td booster every 10 years. Zoster vaccine. You may need this after age  46. Pneumococcal 13-valent conjugate (PCV13) vaccine. One dose is recommended after age 65. Pneumococcal polysaccharide (PPSV23) vaccine. One dose is recommended after age 20. Talk to your health care provider about which screenings and vaccines you need and how often you need them. This information is not intended to replace advice given to you by your health care provider. Make sure you discuss any questions you have with your health care provider. Document Released: 11/28/2015 Document Revised: 07/21/2016 Document Reviewed: 09/02/2015 Elsevier Interactive Patient Education  2017 [place] Prevention in the Home Falls can cause injuries. They can happen to people of all ages. There are many things you can do to make your home safe and to help prevent falls. What can I do on the outside of my home? Regularly fix the edges of walkways and driveways and fix any cracks. Remove anything that might make you trip as you walk through a door, such as a raised step or threshold. Trim any bushes or trees on the path to your home. Use bright outdoor lighting. Clear any walking paths of anything that might make someone trip, such as rocks or tools. Regularly check to see if handrails are loose or broken. Make sure that both sides of any steps have handrails. Any raised decks and porches should have guardrails on the edges. Have any leaves, snow, or ice cleared regularly. Use sand or salt on walking paths during winter. Clean up any spills in your garage right away. This includes oil or grease spills. What can I do in the bathroom? Use night lights. Install grab bars by the toilet and in the tub and shower. Do not use towel bars as grab bars. Use non-skid mats or decals in the tub or shower. If you need to sit down in the shower, use a plastic, non-slip stool. Keep the floor dry. Clean up any water that spills on the floor as soon as it happens. Remove soap buildup in the tub or shower  regularly. Attach bath mats securely with double-sided non-slip rug tape. Do not have throw rugs and other things on the floor that can make you trip. What can I do in the bedroom? Use night lights. Make sure that you have a light by your bed that is easy to reach. Do not use any sheets or blankets that are too big for your bed. They should not hang down onto the floor. Have a firm chair that has side arms. You can use this for support while you get dressed. Do not have throw rugs and other things on the floor that can make you trip. What can I do in the kitchen? Clean up any spills right away. Avoid walking on wet floors. Keep items that you use a lot in easy-to-reach places. If you need to reach something above you, use a strong step stool that has a grab bar. Keep electrical cords out of the way. Do not use floor polish or wax that makes floors slippery. If you must use wax, use non-skid floor wax. Do not have throw rugs and other things on the floor that can make you trip. What can I do with my stairs?  Do not leave any items on the stairs. Make sure that there are handrails on both sides of the stairs and use them. Fix handrails that are broken or loose. Make sure that handrails are as long as the stairways. Check any carpeting to make sure that it is firmly attached to the stairs. Fix any carpet that is loose or worn. Avoid having throw rugs at the top or bottom of the stairs. If you do have throw rugs, attach them to the floor with carpet tape. Make sure that you have a light switch at the top of the stairs and the bottom of the stairs. If you do not have them, ask someone to add them for you. What else can I do to help prevent falls? Wear shoes that: Do not have high heels. Have rubber bottoms. Are comfortable and fit you well. Are closed at the toe. Do not wear sandals. If you use a stepladder: Make sure that it is fully opened. Do not climb a closed stepladder. Make sure that  both sides of the stepladder are locked into place. Ask someone to hold it for you, if possible. Clearly mark and make sure that you can see: Any grab bars or handrails. First and last steps. Where the edge of each step is. Use tools that help you move around (mobility aids) if they are needed. These include: Canes. Walkers. Scooters. Crutches. Turn on the lights when you go into a dark area. Replace any light bulbs as soon as they burn out. Set up your furniture so you have a clear path. Avoid moving your furniture around. If any of your floors are uneven, fix them. If there are any pets around you, be aware of where they are. Review your medicines with your doctor. Some medicines can make you feel dizzy. This can increase your chance of falling. Ask your doctor what other things that you can do to help prevent falls. This information is not intended to replace advice given to you by your health care provider. Make sure you discuss any questions you have with your health care provider. Document Released: 08/28/2009 Document Revised: 04/08/2016 Document Reviewed: 12/06/2014 Elsevier Interactive Patient Education  2017 [employer].

## 2023-05-14 ENCOUNTER — Encounter: Payer: Self-pay | Admitting: Internal Medicine

## 2023-05-16 ENCOUNTER — Encounter: Payer: Self-pay | Admitting: Speech Pathology

## 2023-05-16 ENCOUNTER — Ambulatory Visit: Payer: Medicare Other | Attending: Physician Assistant | Admitting: Speech Pathology

## 2023-05-16 DIAGNOSIS — R1310 Dysphagia, unspecified: Secondary | ICD-10-CM | POA: Insufficient documentation

## 2023-05-16 DIAGNOSIS — R41841 Cognitive communication deficit: Secondary | ICD-10-CM | POA: Insufficient documentation

## 2023-05-16 NOTE — Therapy (Signed)
OUTPATIENT SPEECH LANGUAGE PATHOLOGY TREATMENT NOTE   Patient Name: Ryan Zhang MRN: [id number] DOB:1946-09-01, 77 y.o., male Today's Date: 05/16/2023  PCP: Wanda Plump , MD REFERRING PROVIDER: Vivianne Master  END OF SESSION:   End of Session - 05/16/23 1104     Visit Number 2    Number of Visits 17    Date for SLP Re-Evaluation 07/06/23    SLP Start Time 1100    SLP Stop Time  1140    SLP Time Calculation (min) 40 min    Activity Tolerance Patient tolerated treatment well             Past Medical History:  Diagnosis Date   Allergy    allergy shots Dr. Corinda Gubler   Anemia    Anxiety    Asthma    moderate persistant   Bipolar affective (HCC)    Cataract    both eyes   Clotting disorder (HCC)    Due to blood thinner   COPD (chronic obstructive pulmonary disease) (HCC)    Depression    Diabetes mellitus    DJD (degenerative joint disease)    Dysphagia    Gallstones    hx of, s/p cholecystectomy   GERD (gastroesophageal reflux disease)    Hepatitis A    as teenager 60's   Hollenhorst plaque    right eye   Hyperlipidemia    Hypertension    Kidney stones    hx of   Meniere's disease    Motility disorder, esophageal    Obesity    Pancreatitis    Personal history of colonic polyps 11/2004   hyperplastic.   Stroke [facility])    per MRI   Past Surgical History:  Procedure Laterality Date   CATARACT EXTRACTION Bilateral 09/2015 and 10/2015   CHOLECYSTECTOMY     COLONOSCOPY     EYE SURGERY     JOINT REPLACEMENT     TOTAL KNEE ARTHROPLASTY Bilateral    both knees   Patient Active Problem List   Diagnosis Date Noted   Tinea cruris 07/24/2020   PCP NOTES >>>>>>>>>>>>>>>>>>>>>>>>>>>>>>> 01/12/2016   Dry skin 05/09/2015   Folliculitis 05/09/2015   Hearing loss in right ear 08/05/2014   Pre-ulcerative corn or callous 12/27/2013   Panic attack as reaction to stress 04/20/2012   Dysphagia, neurologic 01/07/2012   Morbid obesity (HCC) 01/07/2012   Chest  pain, musculoskeletal 12/07/2011   Black-out (not amnesia) 12/07/2011   Candidiasis of skin 08/12/2011   Annual physical exam 06/07/2011   OLECRANON BURSITIS 01/29/2011   PARTIAL ARTERIAL OCCLUSION OF RETINA 05/27/2010   Essential hypertension, benign 01/16/2010   ABNORMAL ELECTROCARDIOGRAM 01/14/2010   Asthma 10/14/2009   GERD 10/14/2009   Bipolar disorder (HCC) 02/20/2009   PHIMOSIS 02/20/2009   KNEE PAIN, CHRONIC 11/20/2008   HIP PAIN, RIGHT 08/19/2008   DM type 2 with diabetic peripheral neuropathy (HCC) 02/22/2007   Hyperlipidemia associated with type 2 diabetes mellitus (HCC) 02/22/2007   ALLERGIC RHINITIS, SEASONAL 02/22/2007   LOW BACK PAIN, CHRONIC 02/22/2007   MENIERE'S DISEASE, HX OF 02/22/2007    ONSET DATE: 2015    REFERRING DIAG:   R13.10 (ICD-10-CM) - Dysphagia, unspecified    THERAPY DIAG:  Dysphagia, unspecified type  Rationale for Evaluation and Treatment: Habilitation  SUBJECTIVE:   SUBJECTIVE STATEMENT:  PAIN:  Are you having pain? No    OBJECTIVE:   TODAY'S TREATMENT:  DATE:   05/16/23: Pt was seen for skilled ST services targeting continued dysphagia evaluation and treatment. SLP provided extensive edu on aspiration and the three pillars of aspiration. Handout was provided. Pt and wife have no further questions at this time.   Completed CSE. See below for details. Effortful swallow + chin tuck were successful in clearing pt's feeling of food sticking in throat. Liquid wash was also successful; though, pt coughed immediately following. Pt was noted to throw head back while drinking. SLP cued pt to keep neutral head positioning when eating/drinking to reduce risk of aspiration. To provided safe swallow strategies next session with handout. Also to edu on soft+bite diet.   CLINICAL SWALLOW ASSESSMENT:   Current  diet: regular and thin liquids Dentition: adequate natural dentition Patient directly observed with POs: Yes; sandwich, chips, fruit, water Feeding: able to feed self Liquids provided by:  Cup/water bottle  Oral phase signs and symptoms:  moderately prolonged mastication noted with sandwich, mildly prolonged mastication with chips and fruit  Pharyngeal phase signs and symptoms:  -Solids: multiple swallows to clear suspected "residue"; pt verbalized food feeling stuck.  -Liquids: Immediate and delayed coughs noted with liquids, vocal quality change with x2/5 drinks        PATIENT EDUCATION: Education details: dysphagia, MBS results Person educated: Patient and Spouse Education method: Explanation and Handouts Education comprehension: needs further education     ASSESSMENT:   CLINICAL IMPRESSION: Pt is a 77 yo male who presents to ST OP for evaluation post referral from ENT. Pt endorses difficulty swallowing since 2015. Reports hx of CVA in 2013; however, no residual deficits. He has difficulty swallowing solids, liquids, and pills. Pt and wife feel like they were not provided adequate education regarding swallowing deficits. SLP printed MBS report and provided thorough education regarding his moderate pharyngeal dysphagia diagnosis. To cont next session. Pt swallowing to be assessed next session - pt to bring in food and liquid. SLP rec skilled ST services to address dysphagia.     OBJECTIVE IMPAIRMENTS: include dysphagia. These impairments are limiting patient from safety when swallowing. Factors affecting potential to achieve goals and functional outcome are  na . Patient will benefit from skilled SLP services to address above impairments and improve overall function.   REHAB POTENTIAL: Good     GOALS: Goals reviewed with patient? Yes   SHORT TERM GOALS: Target date: 06/05/23   Pt/wife will recall 3 swallow strategies to increase patient safety while eating. Baseline: Goal  status: INITIAL   2.  Pt will tolerate regular diet without overt s/x of aspiration when following safe swallow strategies. Baseline:  Goal status: INITIAL       LONG TERM GOALS: Target date: 07/06/23   Pt will increase score on EAT-10. Baseline:  Goal status: INITIAL   2.  Pt/wife will report increased swallow safety and less coughing events while eating. Baseline:  Goal status: INITIAL       PLAN:   SLP FREQUENCY: 2x/week   SLP DURATION: 8 weeks   PLANNED INTERVENTIONS: Aspiration precaution training, Pharyngeal strengthening exercises, Diet toleration management , SLP instruction and feedback, Compensatory strategies, and Patient/family education   [address], CCC-SLP 05/16/2023, 11:04 AM

## 2023-05-18 ENCOUNTER — Encounter: Payer: Self-pay | Admitting: Speech Pathology

## 2023-05-18 ENCOUNTER — Ambulatory Visit: Payer: Medicare Other | Admitting: Speech Pathology

## 2023-05-18 ENCOUNTER — Other Ambulatory Visit: Payer: Self-pay

## 2023-05-18 DIAGNOSIS — R1310 Dysphagia, unspecified: Secondary | ICD-10-CM | POA: Diagnosis not present

## 2023-05-18 DIAGNOSIS — J3089 Other allergic rhinitis: Secondary | ICD-10-CM | POA: Diagnosis not present

## 2023-05-18 DIAGNOSIS — J301 Allergic rhinitis due to pollen: Secondary | ICD-10-CM | POA: Diagnosis not present

## 2023-05-18 DIAGNOSIS — R269 Unspecified abnormalities of gait and mobility: Secondary | ICD-10-CM

## 2023-05-18 DIAGNOSIS — R531 Weakness: Secondary | ICD-10-CM

## 2023-05-18 DIAGNOSIS — J3081 Allergic rhinitis due to animal (cat) (dog) hair and dander: Secondary | ICD-10-CM | POA: Diagnosis not present

## 2023-05-18 NOTE — Therapy (Signed)
OUTPATIENT SPEECH LANGUAGE PATHOLOGY TREATMENT NOTE   Patient Name: Ryan Zhang MRN: [id number] DOB:Dec 08, 1945, 77 y.o., male Today's Date: 05/18/2023  PCP: Wanda Plump , MD REFERRING PROVIDER: Vivianne Master  END OF SESSION:   End of Session - 05/18/23 1105     Visit Number 3    Number of Visits 17    Date for SLP Re-Evaluation 07/06/23    SLP Start Time 1100    SLP Stop Time  1140    SLP Time Calculation (min) 40 min    Activity Tolerance Patient tolerated treatment well             Past Medical History:  Diagnosis Date   Allergy    allergy shots Dr. Corinda Gubler   Anemia    Anxiety    Asthma    moderate persistant   Bipolar affective (HCC)    Cataract    both eyes   Clotting disorder (HCC)    Due to blood thinner   COPD (chronic obstructive pulmonary disease) (HCC)    Depression    Diabetes mellitus    DJD (degenerative joint disease)    Dysphagia    Gallstones    hx of, s/p cholecystectomy   GERD (gastroesophageal reflux disease)    Hepatitis A    as teenager 60's   Hollenhorst plaque    right eye   Hyperlipidemia    Hypertension    Kidney stones    hx of   Meniere's disease    Motility disorder, esophageal    Obesity    Pancreatitis    Personal history of colonic polyps 11/2004   hyperplastic.   Stroke [facility])    per MRI   Past Surgical History:  Procedure Laterality Date   CATARACT EXTRACTION Bilateral 09/2015 and 10/2015   CHOLECYSTECTOMY     COLONOSCOPY     EYE SURGERY     JOINT REPLACEMENT     TOTAL KNEE ARTHROPLASTY Bilateral    both knees   Patient Active Problem List   Diagnosis Date Noted   Tinea cruris 07/24/2020   PCP NOTES >>>>>>>>>>>>>>>>>>>>>>>>>>>>>>> 01/12/2016   Dry skin 05/09/2015   Folliculitis 05/09/2015   Hearing loss in right ear 08/05/2014   Pre-ulcerative corn or callous 12/27/2013   Panic attack as reaction to stress 04/20/2012   Dysphagia, neurologic 01/07/2012   Morbid obesity (HCC) 01/07/2012   Chest  pain, musculoskeletal 12/07/2011   Black-out (not amnesia) 12/07/2011   Candidiasis of skin 08/12/2011   Annual physical exam 06/07/2011   OLECRANON BURSITIS 01/29/2011   PARTIAL ARTERIAL OCCLUSION OF RETINA 05/27/2010   Essential hypertension, benign 01/16/2010   ABNORMAL ELECTROCARDIOGRAM 01/14/2010   Asthma 10/14/2009   GERD 10/14/2009   Bipolar disorder (HCC) 02/20/2009   PHIMOSIS 02/20/2009   KNEE PAIN, CHRONIC 11/20/2008   HIP PAIN, RIGHT 08/19/2008   DM type 2 with diabetic peripheral neuropathy (HCC) 02/22/2007   Hyperlipidemia associated with type 2 diabetes mellitus (HCC) 02/22/2007   ALLERGIC RHINITIS, SEASONAL 02/22/2007   LOW BACK PAIN, CHRONIC 02/22/2007   MENIERE'S DISEASE, HX OF 02/22/2007    ONSET DATE: 2015    REFERRING DIAG:   R13.10 (ICD-10-CM) - Dysphagia, unspecified    THERAPY DIAG:  Dysphagia, unspecified type  Rationale for Evaluation and Treatment: Habilitation  SUBJECTIVE:   SUBJECTIVE STATEMENT:  PAIN:  Are you having pain? No    OBJECTIVE:   TODAY'S TREATMENT:  DATE:   05/18/23: Pt was seen for skilled ST services targeting dysphagia education on safe swallow strategies. Pt is currently on a regular/thin liquid diet; however, does report difficulty with some regular textures. SLP introduced IDDSI framework for "Soft and bite" diet to assist with finding modifications for foods (smaller bites, adding sauces/moisture). Also provided pt personalized "Safe Swallow Strategies" handout. Discussed important strategies re: effortful swallow, small bites/sips, 2 swallows per bite, check mouth for residue post meal, listen for change in vocal quality and throat clear. To discuss exercises next session.    05/16/23: Pt was seen for skilled ST services targeting continued dysphagia evaluation and treatment. SLP provided  extensive edu on aspiration and the three pillars of aspiration. Handout was provided. Pt and wife have no further questions at this time.   Completed CSE. See below for details. Effortful swallow + chin tuck were successful in clearing pt's feeling of food sticking in throat. Liquid wash was also successful; though, pt coughed immediately following. Pt was noted to throw head back while drinking. SLP cued pt to keep neutral head positioning when eating/drinking to reduce risk of aspiration. To provided safe swallow strategies next session with handout. Also to edu on soft+bite diet.   CLINICAL SWALLOW ASSESSMENT:   Current diet: regular and thin liquids Dentition: adequate natural dentition Patient directly observed with POs: Yes; sandwich, chips, fruit, water Feeding: able to feed self Liquids provided by:  Cup/water bottle  Oral phase signs and symptoms:  moderately prolonged mastication noted with sandwich, mildly prolonged mastication with chips and fruit  Pharyngeal phase signs and symptoms:  -Solids: multiple swallows to clear suspected "residue"; pt verbalized food feeling stuck.  -Liquids: Immediate and delayed coughs noted with liquids, vocal quality change with x2/5 drinks        PATIENT EDUCATION: Education details: dysphagia, MBS results Person educated: Patient and Spouse Education method: Explanation and Handouts Education comprehension: needs further education     ASSESSMENT:   CLINICAL IMPRESSION: Pt is a 77 yo male who presents to ST OP for evaluation post referral from ENT. Pt endorses difficulty swallowing since 2015. Reports hx of CVA in 2013; however, no residual deficits. He has difficulty swallowing solids, liquids, and pills. Pt and wife feel like they were not provided adequate education regarding swallowing deficits. SLP printed MBS report and provided thorough education regarding his moderate pharyngeal dysphagia diagnosis. To cont next session. Pt swallowing  to be assessed next session - pt to bring in food and liquid. SLP rec skilled ST services to address dysphagia.     OBJECTIVE IMPAIRMENTS: include dysphagia. These impairments are limiting patient from safety when swallowing. Factors affecting potential to achieve goals and functional outcome are  na . Patient will benefit from skilled SLP services to address above impairments and improve overall function.   REHAB POTENTIAL: Good     GOALS: Goals reviewed with patient? Yes   SHORT TERM GOALS: Target date: 06/05/23   Pt/wife will recall 3 swallow strategies to increase patient safety while eating. Baseline: Goal status: PROGRESSING   2.  Pt will tolerate regular diet without overt s/x of aspiration when following safe swallow strategies. Baseline:  Goal status: INITIAL       LONG TERM GOALS: Target date: 07/06/23   Pt will increase score on EAT-10. Baseline:  Goal status: INITIAL   2.  Pt/wife will report increased swallow safety and less coughing events while eating. Baseline:  Goal status: INITIAL       PLAN:  SLP FREQUENCY: 2x/week   SLP DURATION: 8 weeks   PLANNED INTERVENTIONS: Aspiration precaution training, Pharyngeal strengthening exercises, Diet toleration management , SLP instruction and feedback, Compensatory strategies, and Patient/family education   [address], CCC-SLP 05/18/2023, 11:06 AM

## 2023-05-23 ENCOUNTER — Ambulatory Visit: Payer: Medicare Other | Admitting: Speech Pathology

## 2023-05-23 ENCOUNTER — Encounter: Payer: Self-pay | Admitting: Speech Pathology

## 2023-05-23 DIAGNOSIS — R1310 Dysphagia, unspecified: Secondary | ICD-10-CM

## 2023-05-23 NOTE — Therapy (Signed)
OUTPATIENT SPEECH LANGUAGE PATHOLOGY TREATMENT NOTE   Patient Name: Ryan Zhang MRN: [id number] DOB:December 05, 1945, 77 y.o., male Today's Date: 05/18/2023  PCP: Wanda Plump , MD REFERRING PROVIDER: Vivianne Master  END OF SESSION:   End of Session - 05/18/23 1105     Visit Number 3    Number of Visits 17    Date for SLP Re-Evaluation 07/06/23    SLP Start Time 1100    SLP Stop Time  1140    SLP Time Calculation (min) 40 min    Activity Tolerance Patient tolerated treatment well             Past Medical History:  Diagnosis Date   Allergy    allergy shots Dr. Corinda Gubler   Anemia    Anxiety    Asthma    moderate persistant   Bipolar affective (HCC)    Cataract    both eyes   Clotting disorder (HCC)    Due to blood thinner   COPD (chronic obstructive pulmonary disease) (HCC)    Depression    Diabetes mellitus    DJD (degenerative joint disease)    Dysphagia    Gallstones    hx of, s/p cholecystectomy   GERD (gastroesophageal reflux disease)    Hepatitis A    as teenager 60's   Hollenhorst plaque    right eye   Hyperlipidemia    Hypertension    Kidney stones    hx of   Meniere's disease    Motility disorder, esophageal    Obesity    Pancreatitis    Personal history of colonic polyps 11/2004   hyperplastic.   Stroke [facility])    per MRI   Past Surgical History:  Procedure Laterality Date   CATARACT EXTRACTION Bilateral 09/2015 and 10/2015   CHOLECYSTECTOMY     COLONOSCOPY     EYE SURGERY     JOINT REPLACEMENT     TOTAL KNEE ARTHROPLASTY Bilateral    both knees   Patient Active Problem List   Diagnosis Date Noted   Tinea cruris 07/24/2020   PCP NOTES >>>>>>>>>>>>>>>>>>>>>>>>>>>>>>> 01/12/2016   Dry skin 05/09/2015   Folliculitis 05/09/2015   Hearing loss in right ear 08/05/2014   Pre-ulcerative corn or callous 12/27/2013   Panic attack as reaction to stress 04/20/2012   Dysphagia, neurologic 01/07/2012   Morbid obesity (HCC) 01/07/2012   Chest  pain, musculoskeletal 12/07/2011   Black-out (not amnesia) 12/07/2011   Candidiasis of skin 08/12/2011   Annual physical exam 06/07/2011   OLECRANON BURSITIS 01/29/2011   PARTIAL ARTERIAL OCCLUSION OF RETINA 05/27/2010   Essential hypertension, benign 01/16/2010   ABNORMAL ELECTROCARDIOGRAM 01/14/2010   Asthma 10/14/2009   GERD 10/14/2009   Bipolar disorder (HCC) 02/20/2009   PHIMOSIS 02/20/2009   KNEE PAIN, CHRONIC 11/20/2008   HIP PAIN, RIGHT 08/19/2008   DM type 2 with diabetic peripheral neuropathy (HCC) 02/22/2007   Hyperlipidemia associated with type 2 diabetes mellitus (HCC) 02/22/2007   ALLERGIC RHINITIS, SEASONAL 02/22/2007   LOW BACK PAIN, CHRONIC 02/22/2007   MENIERE'S DISEASE, HX OF 02/22/2007    ONSET DATE: 2015    REFERRING DIAG:   R13.10 (ICD-10-CM) - Dysphagia, unspecified    THERAPY DIAG:  Dysphagia, unspecified type  Rationale for Evaluation and Treatment: Habilitation  SUBJECTIVE:   SUBJECTIVE STATEMENT:  PAIN:  Are you having pain? Yes 4/10 Knees  Walking No medicine taken    OBJECTIVE:   TODAY'S TREATMENT:  DATE:    05/23/23: Pt was seen for skilled ST services targeting dysphagia. Pt reports he has been more aware of textures giving him difficulty re: dry rice. He reports he is going to find some sort of sauce to provide moisture and create a more cohesive bolus. SLP edu on swallowing exercises for decreased epiglottic inversion, pharyngeal residue, and decreased laryngeal vestibule closure re: supraglottic swallow, effortful swallow, and chin tuck against resistance. Pt was able to demonstrate all exercises with sup.   Reviewed safe swallow strategies and purpose of oral hold/chin tuck as recommended by imaging SLP.   Provided videos and handouts. Pt and wife reported understanding.   Cont with exercises  next session. To decrease to x1/week next week.   05/18/23: Pt was seen for skilled ST services targeting dysphagia education on safe swallow strategies. Pt is currently on a regular/thin liquid diet; however, does report difficulty with some regular textures. SLP introduced IDDSI framework for "Soft and bite" diet to assist with finding modifications for foods (smaller bites, adding sauces/moisture). Also provided pt personalized "Safe Swallow Strategies" handout. Discussed important strategies re: effortful swallow, small bites/sips, 2 swallows per bite, check mouth for residue post meal, listen for change in vocal quality and throat clear. To discuss exercises next session.    05/16/23: Pt was seen for skilled ST services targeting continued dysphagia evaluation and treatment. SLP provided extensive edu on aspiration and the three pillars of aspiration. Handout was provided. Pt and wife have no further questions at this time.   Completed CSE. See below for details. Effortful swallow + chin tuck were successful in clearing pt's feeling of food sticking in throat. Liquid wash was also successful; though, pt coughed immediately following. Pt was noted to throw head back while drinking. SLP cued pt to keep neutral head positioning when eating/drinking to reduce risk of aspiration. To provided safe swallow strategies next session with handout. Also to edu on soft+bite diet.   CLINICAL SWALLOW ASSESSMENT:   Current diet: regular and thin liquids Dentition: adequate natural dentition Patient directly observed with POs: Yes; sandwich, chips, fruit, water Feeding: able to feed self Liquids provided by:  Cup/water bottle  Oral phase signs and symptoms:  moderately prolonged mastication noted with sandwich, mildly prolonged mastication with chips and fruit  Pharyngeal phase signs and symptoms:  -Solids: multiple swallows to clear suspected "residue"; pt verbalized food feeling stuck.  -Liquids: Immediate and  delayed coughs noted with liquids, vocal quality change with x2/5 drinks        PATIENT EDUCATION: Education details: dysphagia, MBS results Person educated: Patient and Spouse Education method: Explanation and Handouts Education comprehension: needs further education     ASSESSMENT:   CLINICAL IMPRESSION: Pt is a 77 yo male who presents to ST OP for evaluation post referral from ENT. Pt endorses difficulty swallowing since 2015. Reports hx of CVA in 2013; however, no residual deficits. He has difficulty swallowing solids, liquids, and pills. Pt and wife feel like they were not provided adequate education regarding swallowing deficits. SLP printed MBS report and provided thorough education regarding his moderate pharyngeal dysphagia diagnosis. To cont next session. Pt swallowing to be assessed next session - pt to bring in food and liquid. SLP rec skilled ST services to address dysphagia.     OBJECTIVE IMPAIRMENTS: include dysphagia. These impairments are limiting patient from safety when swallowing. Factors affecting potential to achieve goals and functional outcome are  na . Patient will benefit from skilled SLP services to address  above impairments and improve overall function.   REHAB POTENTIAL: Good     GOALS: Goals reviewed with patient? Yes   SHORT TERM GOALS: Target date: 06/05/23   Pt/wife will recall 3 swallow strategies to increase patient safety while eating. Baseline: Goal status: PROGRESSING   2.  Pt will tolerate regular diet without overt s/x of aspiration when following safe swallow strategies. Baseline:  Goal status: INITIAL       LONG TERM GOALS: Target date: 07/06/23   Pt will increase score on EAT-10. Baseline:  Goal status: INITIAL   2.  Pt/wife will report increased swallow safety and less coughing events while eating. Baseline:  Goal status: INITIAL       PLAN:   SLP FREQUENCY: 2x/week   SLP DURATION: 8 weeks   PLANNED INTERVENTIONS:  Aspiration precaution training, Pharyngeal strengthening exercises, Diet toleration management , SLP instruction and feedback, Compensatory strategies, and Patient/family education   [address], CCC-SLP 05/18/2023, 11:06 AM

## 2023-05-24 ENCOUNTER — Ambulatory Visit (HOSPITAL_BASED_OUTPATIENT_CLINIC_OR_DEPARTMENT_OTHER)
Admission: RE | Admit: 2023-05-24 | Discharge: 2023-05-24 | Disposition: A | Discharge status: Home / Self Care | Payer: Medicare Other | Source: Ambulatory Visit | Attending: Internal Medicine | Admitting: Internal Medicine

## 2023-05-24 ENCOUNTER — Ambulatory Visit (INDEPENDENT_AMBULATORY_CARE_PROVIDER_SITE_OTHER): Payer: Medicare Other | Admitting: Internal Medicine

## 2023-05-24 ENCOUNTER — Encounter: Payer: Self-pay | Admitting: Internal Medicine

## 2023-05-24 VITALS — BP 138/68 | HR 86 | Temp 98.0°F | Resp 18 | Ht 70.0 in | Wt 237.4 lb

## 2023-05-24 DIAGNOSIS — W19XXXA Unspecified fall, initial encounter: Secondary | ICD-10-CM | POA: Diagnosis not present

## 2023-05-24 DIAGNOSIS — R42 Dizziness and giddiness: Secondary | ICD-10-CM

## 2023-05-24 DIAGNOSIS — M545 Low back pain, unspecified: Secondary | ICD-10-CM

## 2023-05-24 DIAGNOSIS — Y929 Unspecified place or not applicable: Secondary | ICD-10-CM | POA: Insufficient documentation

## 2023-05-24 DIAGNOSIS — Y939 Activity, unspecified: Secondary | ICD-10-CM | POA: Insufficient documentation

## 2023-05-24 DIAGNOSIS — G44319 Acute post-traumatic headache, not intractable: Secondary | ICD-10-CM | POA: Diagnosis not present

## 2023-05-24 DIAGNOSIS — R627 Adult failure to thrive: Secondary | ICD-10-CM | POA: Diagnosis not present

## 2023-05-24 DIAGNOSIS — G44309 Post-traumatic headache, unspecified, not intractable: Secondary | ICD-10-CM | POA: Diagnosis not present

## 2023-05-24 DIAGNOSIS — Z7984 Long term (current) use of oral hypoglycemic drugs: Secondary | ICD-10-CM | POA: Diagnosis not present

## 2023-05-24 DIAGNOSIS — E1142 Type 2 diabetes mellitus with diabetic polyneuropathy: Secondary | ICD-10-CM

## 2023-05-24 DIAGNOSIS — I6782 Cerebral ischemia: Secondary | ICD-10-CM | POA: Diagnosis not present

## 2023-05-24 NOTE — Progress Notes (Addendum)
Subjective:    Patient ID: Ryan Zhang, male    DOB: 1946/09/25, 77 y.o.   MRN: [id number]  DOS:  05/24/2023 Type of visit - description: Here with his wife, multiple concerns and symptoms  Overall not feeling well. Multiple falls.  Typically when he is trying to squat or bend over.  No associated chest pain or palpitations.  Last fall was 2 weeks ago, fell backwards, bumped hisr head, did not have neck pain, no LOC.  Was able to get up by himself.   Also, has  dizziness, random episodes, not necessarily associated with moving his head, lasts 30 seconds to 2 minutes. No associated nausea.  No double vision, motor deficits, face numbness.  Does not know is associated with speech problems because he is typically by himself.  Also history of DJD, having pains in multiple joints. No fever or chills Admits to mild headache on and off for a while.  No weight loss, or visual deficits.  No myalgias.  Has a chronic back pain.  Denies any LUTS.  Review of Systems See above   Past Medical History:  Diagnosis Date   Allergy    allergy shots Dr. Corinda Gubler   Anemia    Anxiety    Asthma    moderate persistant   Bipolar affective (HCC)    Cataract    both eyes   Clotting disorder (HCC)    Due to blood thinner   COPD (chronic obstructive pulmonary disease) (HCC)    Depression    Diabetes mellitus    DJD (degenerative joint disease)    Dysphagia    Gallstones    hx of, s/p cholecystectomy   GERD (gastroesophageal reflux disease)    Hepatitis A    as teenager 60's   Hollenhorst plaque    right eye   Hyperlipidemia    Hypertension    Kidney stones    hx of   Meniere's disease    Motility disorder, esophageal    Obesity    Pancreatitis    Personal history of colonic polyps 11/2004   hyperplastic.   Stroke [facility])    per MRI    Past Surgical History:  Procedure Laterality Date   CATARACT EXTRACTION Bilateral 09/2015 and 10/2015   CHOLECYSTECTOMY     COLONOSCOPY     EYE  SURGERY     JOINT REPLACEMENT     TOTAL KNEE ARTHROPLASTY Bilateral    both knees    Current Outpatient Medications  Medication Instructions   acetaminophen (TYLENOL) 650 mg, Oral, Every 6 hours PRN   albuterol (PROVENTIL) 2.5 mg, Nebulization, Every 6 hours PRN   albuterol (VENTOLIN HFA) 108 (90 Base) MCG/ACT inhaler 2 puffs, Inhalation, Every 6 hours PRN   augmented betamethasone dipropionate (DIPROLENE-AF) 0.05 % cream Topical, 2 times daily   budesonide-formoterol (SYMBICORT) 160-4.5 MCG/ACT inhaler 2 puffs, Inhalation, 2 times daily   buPROPion (WELLBUTRIN XL) 150 mg, Oral, Daily   clonazePAM (KLONOPIN) 0.5 mg, Oral, See admin instructions, #2 at 8am, #1 at 2pm ([name])   clopidogrel (PLAVIX) 75 mg, Oral, Daily   DULoxetine (CYMBALTA) 60 mg, Oral, BH-each morning   EPIPEN 2-PAK 0.3 MG/0.3ML SOAJ injection Reported on 04/29/2016   esomeprazole (NEXIUM) 40 mg, Oral, BH-each morning   fexofenadine (ALLEGRA) 180 mg, Oral, Every morning, 8am Ryan Zhang)   fluticasone (VERAMYST) 27.5 MCG/SPRAY nasal spray 2 sprays, Nasal, Daily PRN   lamoTRIgine (LAMICTAL) 50 mg, Oral, Daily at bedtime   Lancets (ONETOUCH ULTRASOFT) lancets Check  blood sugars no more than twice daily   losartan (COZAAR) 25 mg, Oral, Every evening   montelukast (SINGULAIR) 10 mg, Oral, Daily at bedtime, Ryan Zhang    Multiple Vitamin (MULTIVITAMIN) tablet 1 tablet, Oral, Daily   nystatin (MYCOSTATIN/NYSTOP) powder 1 application , Topical, 3 times daily   nystatin ointment (MYCOSTATIN) APPLY TOPICALLY TWICE A DAY AS NEEDED   ONETOUCH VERIO test strip use to check blood sugar no more than twice daily   pioglitazone-metformin (ACTOPLUS MET) 15-850 MG tablet 1 tablet, Oral, 2 times daily with meals   simvastatin (ZOCOR) 40 mg, Oral, Daily at bedtime   solifenacin (VESICARE) 10 mg, Oral, Daily       Objective:   Physical Exam BP 138/68   Pulse 86   Temp 98 F (36.7 C) (Oral)   Resp 18   Ht 5\' 10"  (1.778 m)   Wt  237 lb 6 oz (107.7 kg)   SpO2 95%   BMI 34.06 kg/m  General:   Well developed, NAD, BMI noted. HEENT:  Normocephalic . Face symmetric, atraumatic.  Looks pale. EOMI.  Pupils equal and reactive. Lungs:  CTA B Normal respiratory effort, no intercostal retractions, no accessory muscle use. Heart: RRR,  no murmur.  Lower extremities: no pretibial edema bilaterally  Skin: Not pale. Not jaundice Neurologic:  alert & oriented X3.  Speech normal, gait appropriate for age, assisted by cane. Motor symmetric, weak lower extremity strength. Psych--  Cognition and judgment appear intact.  Cooperative with normal attention span and concentration.  Behavior appropriate. No anxious or depressed appearing.      Assessment     Assessment DM, mild neuropathy HTN Hyperlipidemia Morbid obesity (BMI 39 plus DM) Bipolar, Depression, cognitive issues after ECT 2007 (on disability) per   psychiatry, Dr Donell Beers DJD-- hydrocodone rarely rx by [place] ortho Asthma-Allergies ------------ Dr Ryan Zhang  GERD, h/o dysphagia (chronic, neurogenic-transfer dysphagia? See GI note 12-2011) Neuro: --? stroke : saw neuro 2013 d/t B transient visual loss, ASA changed to plavix.  ---Saw neuro 11-2013: fall, syncope,parkinsonism d/t sapharis? CV: Enlarged ascending Ao , + Ao atherosclerosis  --per CT 12-2015,CT chest 08-2016 stable.    --MRI chest 10/2017 stable, no routine f/u Nl LE arterial dopplers 2015 Carotid artery disease: Per [place]  2011, last [place] 2018: <50% B,   08/2019 < 1-39% B, 09-2021 < 39%, stable consider recheck in 2 years  CT chest  ---RML  8mm: 07-2014, CT 12-2015, CT 08-2016, MRI chest 12,2018, CT chest 07/2018:Stable  HOH: L deaf L, R  hearing aid H/o Mnire's disease  Iron deficiency anemia: Noted 01/2018, + Hemoccult, 09-2018: Colonoscopy and EGD were completely normal  Urology- on Vesicare, sees urology  PLAN: Failure to thrive, frequent falls, dizziness, head injury. Symptoms as described  above, his main concern is that she is feeling dizzy. Did have a fall and a minor head injury.   He looks pale.  Mental status is normal, good memory. Plan: CT head without BMP,CBC, UA, urine culture, A1c Low back pain: Located at the left side of the thoracic spine, check x-ray.  Further advised with results. GERD, dysphagia: Saw GI 02/01/2023.  Seen for GERD and chronic cough. Recommended modified barium swallowing study, done , now followed by speech pathology. Was refer to ENT to rule out structural problems.  Laryngoscopy negative RTC 3 weeks

## 2023-05-24 NOTE — Addendum Note (Signed)
Addended byConrad  D on: 05/24/2023 04:27 PM   Modules accepted: Orders

## 2023-05-24 NOTE — Patient Instructions (Addendum)
Proceed with blood work and a urine sample  Go to the first floor, get CAT scan of your head  Call if your symptoms worsen  Come back in 2-3   weeks   Please bring or send [place] a copy of your Healthcare Power of Attorney for your chart.

## 2023-05-25 DIAGNOSIS — J301 Allergic rhinitis due to pollen: Secondary | ICD-10-CM | POA: Diagnosis not present

## 2023-05-25 DIAGNOSIS — J3089 Other allergic rhinitis: Secondary | ICD-10-CM | POA: Diagnosis not present

## 2023-05-25 DIAGNOSIS — J3081 Allergic rhinitis due to animal (cat) (dog) hair and dander: Secondary | ICD-10-CM | POA: Diagnosis not present

## 2023-05-25 LAB — URINALYSIS, ROUTINE W REFLEX MICROSCOPIC
Bilirubin Urine: NEGATIVE
Hgb urine dipstick: NEGATIVE
Ketones, ur: NEGATIVE
Nitrite: POSITIVE — AB
RBC / HPF: NONE SEEN (ref 0–?)
Specific Gravity, Urine: >=1.03 — AB (ref 1.000–1.030)
Total Protein, Urine: NEGATIVE
Urine Glucose: NEGATIVE
Urobilinogen, UA: 1 (ref 0.0–1.0)
pH: 6 (ref 5.0–8.0)

## 2023-05-25 LAB — CBC WITH DIFFERENTIAL/PLATELET
Basophils Absolute: 0.1 10*3/uL (ref 0.0–0.1)
Basophils Relative: 0.7 % (ref 0.0–3.0)
Eosinophils Absolute: 0.4 10*3/uL (ref 0.0–0.7)
Eosinophils Relative: 4.1 % (ref 0.0–5.0)
HCT: 34.3 % — ABNORMAL LOW (ref 39.0–52.0)
Hemoglobin: 10.8 g/dL — ABNORMAL LOW (ref 13.0–17.0)
Lymphocytes Relative: 22.5 % (ref 12.0–46.0)
Lymphs Abs: 2 10*3/uL (ref 0.7–4.0)
MCHC: 31.5 g/dL (ref 30.0–36.0)
MCV: 80.4 fl (ref 78.0–100.0)
Monocytes Absolute: 0.6 10*3/uL (ref 0.1–1.0)
Monocytes Relative: 7.4 % (ref 3.0–12.0)
Neutro Abs: 5.7 10*3/uL (ref 1.4–7.7)
Neutrophils Relative %: 65.3 % (ref 43.0–77.0)
Platelets: 350 10*3/uL (ref 150.0–400.0)
RBC: 4.27 Mil/uL (ref 4.22–5.81)
RDW: 16.8 % — ABNORMAL HIGH (ref 11.5–15.5)
WBC: 8.8 10*3/uL (ref 4.0–10.5)

## 2023-05-25 LAB — BASIC METABOLIC PANEL
BUN: 33 mg/dL — ABNORMAL HIGH (ref 6–23)
CO2: 26 mEq/L (ref 19–32)
Calcium: 9.3 mg/dL (ref 8.4–10.5)
Chloride: 103 mEq/L (ref 96–112)
Creatinine, Ser: 1.01 mg/dL (ref 0.40–1.50)
GFR: 73.02 mL/min (ref >60.00)
Glucose, Bld: 200 mg/dL — ABNORMAL HIGH (ref 70–99)
Potassium: 5 mEq/L (ref 3.5–5.1)
Sodium: 141 mEq/L (ref 135–145)

## 2023-05-25 LAB — HEMOGLOBIN A1C: Hgb A1c MFr Bld: 6.6 % — ABNORMAL HIGH (ref 4.6–6.5)

## 2023-05-25 NOTE — Assessment & Plan Note (Addendum)
Failure to thrive, frequent falls, dizziness, head injury. Symptoms as described above, his main concern is that she is feeling dizzy. Did have a fall and a minor head injury.   He looks pale.  Mental status is normal, good memory. Plan: CT head without BMP,CBC, UA, urine culture, A1c Low back pain: Located at the left side of the thoracic spine, check x-ray.  Further advised with results. GERD, dysphagia: Saw GI 02/01/2023.  Seen for GERD and chronic cough. Recommended modified barium swallowing study, done , now followed by speech pathology. Was refer to ENT to rule out structural problems.  Laryngoscopy negative RTC 3 weeks

## 2023-05-26 ENCOUNTER — Ambulatory Visit: Payer: Medicare Other | Admitting: Speech Pathology

## 2023-05-26 ENCOUNTER — Encounter: Payer: Self-pay | Admitting: Speech Pathology

## 2023-05-26 DIAGNOSIS — R2689 Other abnormalities of gait and mobility: Secondary | ICD-10-CM | POA: Diagnosis not present

## 2023-05-26 DIAGNOSIS — R1310 Dysphagia, unspecified: Secondary | ICD-10-CM

## 2023-05-26 DIAGNOSIS — M6281 Muscle weakness (generalized): Secondary | ICD-10-CM | POA: Diagnosis not present

## 2023-05-26 DIAGNOSIS — Z9181 History of falling: Secondary | ICD-10-CM | POA: Diagnosis not present

## 2023-05-26 NOTE — Therapy (Signed)
OUTPATIENT SPEECH LANGUAGE PATHOLOGY TREATMENT NOTE   Patient Name: Ryan Zhang MRN: [id number] DOB:1946-03-28, 77 y.o., male Today's Date: 05/26/2023  PCP: Wanda Plump , MD REFERRING PROVIDER: Vivianne Master  END OF SESSION:   End of Session - 05/26/23 1107     Visit Number 5    Number of Visits 17    Date for SLP Re-Evaluation 07/06/23    SLP Start Time 1105    SLP Stop Time  1145    SLP Time Calculation (min) 40 min    Activity Tolerance Patient tolerated treatment well             Past Medical History:  Diagnosis Date   Allergy    allergy shots Dr. Corinda Gubler   Anemia    Anxiety    Asthma    moderate persistant   Bipolar affective (HCC)    Cataract    both eyes   Clotting disorder (HCC)    Due to blood thinner   COPD (chronic obstructive pulmonary disease) (HCC)    Depression    Diabetes mellitus    DJD (degenerative joint disease)    Dysphagia    Gallstones    hx of, s/p cholecystectomy   GERD (gastroesophageal reflux disease)    Hepatitis A    as teenager 60's   Hollenhorst plaque    right eye   Hyperlipidemia    Hypertension    Kidney stones    hx of   Meniere's disease    Motility disorder, esophageal    Obesity    Pancreatitis    Personal history of colonic polyps 11/2004   hyperplastic.   Stroke [facility])    per MRI   Past Surgical History:  Procedure Laterality Date   CATARACT EXTRACTION Bilateral 09/2015 and 10/2015   CHOLECYSTECTOMY     COLONOSCOPY     EYE SURGERY     JOINT REPLACEMENT     TOTAL KNEE ARTHROPLASTY Bilateral    both knees   Patient Active Problem List   Diagnosis Date Noted   Tinea cruris 07/24/2020   PCP NOTES >>>>>>>>>>>>>>>>>>>>>>>>>>>>>>> 01/12/2016   Dry skin 05/09/2015   Folliculitis 05/09/2015   Hearing loss in right ear 08/05/2014   Pre-ulcerative corn or callous 12/27/2013   Panic attack as reaction to stress 04/20/2012   Dysphagia, neurologic 01/07/2012   Morbid obesity (HCC) 01/07/2012   Chest  pain, musculoskeletal 12/07/2011   Black-out (not amnesia) 12/07/2011   Candidiasis of skin 08/12/2011   Annual physical exam 06/07/2011   OLECRANON BURSITIS 01/29/2011   PARTIAL ARTERIAL OCCLUSION OF RETINA 05/27/2010   Essential hypertension, benign 01/16/2010   ABNORMAL ELECTROCARDIOGRAM 01/14/2010   Asthma 10/14/2009   GERD 10/14/2009   Bipolar disorder (HCC) 02/20/2009   PHIMOSIS 02/20/2009   KNEE PAIN, CHRONIC 11/20/2008   HIP PAIN, RIGHT 08/19/2008   DM type 2 with diabetic peripheral neuropathy (HCC) 02/22/2007   Hyperlipidemia associated with type 2 diabetes mellitus (HCC) 02/22/2007   ALLERGIC RHINITIS, SEASONAL 02/22/2007   LOW BACK PAIN, CHRONIC 02/22/2007   MENIERE'S DISEASE, HX OF 02/22/2007    ONSET DATE: 2015    REFERRING DIAG:   R13.10 (ICD-10-CM) - Dysphagia, unspecified    THERAPY DIAG:  Dysphagia, unspecified type  Rationale for Evaluation and Treatment: Habilitation  SUBJECTIVE:   SUBJECTIVE STATEMENT: " I added some yumyum sauce to my rice."  PAIN:  Are you having pain? Yes 4/10 Knees  Walking No medicine taken    OBJECTIVE:   TODAY'S  TREATMENT:                                                                                                                                         DATE:   05/26/23: Pt was seen for skilled ST services targeting dysphagia. Pt reports he completed the exercises re: CTAR (x30) and hard swallow (x30). He had difficulty completing supraglottic swallow with food. Pt reports it was difficult to initiate swallow sequence with effortful and supraglottic swallows with food/drink. He has been adding moisture to difficult foods (rice) and reports this has been successful. Has been using yogurt to assist with swallowing pills and this has been helpful.   We completed x5 CTAR, x10 effortful swallow, and 3 supraglottic swallow with water.  Patient was able to complete with min assist verbal cues.   05/23/23: Pt was seen for  skilled ST services targeting dysphagia. Pt reports he has been more aware of textures giving him difficulty re: dry rice. He reports he is going to find some sort of sauce to provide moisture and create a more cohesive bolus. SLP edu on swallowing exercises for decreased epiglottic inversion, pharyngeal residue, and decreased laryngeal vestibule closure re: supraglottic swallow, effortful swallow, and chin tuck against resistance. Pt was able to demonstrate all exercises with sup.   Reviewed safe swallow strategies and purpose of oral hold/chin tuck as recommended by imaging SLP.   Provided videos and handouts. Pt and wife reported understanding.   Cont with exercises next session. To decrease to x1/week next week.   05/18/23: Pt was seen for skilled ST services targeting dysphagia education on safe swallow strategies. Pt is currently on a regular/thin liquid diet; however, does report difficulty with some regular textures. SLP introduced IDDSI framework for "Soft and bite" diet to assist with finding modifications for foods (smaller bites, adding sauces/moisture). Also provided pt personalized "Safe Swallow Strategies" handout. Discussed important strategies re: effortful swallow, small bites/sips, 2 swallows per bite, check mouth for residue post meal, listen for change in vocal quality and throat clear. To discuss exercises next session.    05/16/23: Pt was seen for skilled ST services targeting continued dysphagia evaluation and treatment. SLP provided extensive edu on aspiration and the three pillars of aspiration. Handout was provided. Pt and wife have no further questions at this time.   Completed CSE. See below for details. Effortful swallow + chin tuck were successful in clearing pt's feeling of food sticking in throat. Liquid wash was also successful; though, pt coughed immediately following. Pt was noted to throw head back while drinking. SLP cued pt to keep neutral head positioning when  eating/drinking to reduce risk of aspiration. To provided safe swallow strategies next session with handout. Also to edu on soft+bite diet.   CLINICAL SWALLOW ASSESSMENT:   Current diet: regular and thin liquids Dentition: adequate natural dentition Patient directly observed with POs: Yes; sandwich, chips,  fruit, water Feeding: able to feed self Liquids provided by:  Cup/water bottle  Oral phase signs and symptoms:  moderately prolonged mastication noted with sandwich, mildly prolonged mastication with chips and fruit  Pharyngeal phase signs and symptoms:  -Solids: multiple swallows to clear suspected "residue"; pt verbalized food feeling stuck.  -Liquids: Immediate and delayed coughs noted with liquids, vocal quality change with x2/5 drinks        PATIENT EDUCATION: Education details: dysphagia, MBS results Person educated: Patient and Spouse Education method: Explanation and Handouts Education comprehension: needs further education     ASSESSMENT:   CLINICAL IMPRESSION: Pt is a 77 yo male who presents to ST OP for evaluation post referral from ENT. Pt endorses difficulty swallowing since 2015. Reports hx of CVA in 2013; however, no residual deficits. He has difficulty swallowing solids, liquids, and pills. Pt and wife feel like they were not provided adequate education regarding swallowing deficits. SLP printed MBS report and provided thorough education regarding his moderate pharyngeal dysphagia diagnosis. To cont next session. Pt swallowing to be assessed next session - pt to bring in food and liquid. SLP rec skilled ST services to address dysphagia.     OBJECTIVE IMPAIRMENTS: include dysphagia. These impairments are limiting patient from safety when swallowing. Factors affecting potential to achieve goals and functional outcome are  na . Patient will benefit from skilled SLP services to address above impairments and improve overall function.   REHAB POTENTIAL: Good      GOALS: Goals reviewed with patient? Yes   SHORT TERM GOALS: Target date: 06/05/23   Pt/wife will recall 3 swallow strategies to increase patient safety while eating. Baseline: Goal status: PROGRESSING   2.  Pt will tolerate regular diet without overt s/x of aspiration when following safe swallow strategies. Baseline:  Goal status: INITIAL       LONG TERM GOALS: Target date: 07/06/23   Pt will increase score on EAT-10. Baseline:  Goal status: INITIAL   2.  Pt/wife will report increased swallow safety and less coughing events while eating. Baseline:  Goal status: INITIAL       PLAN:   SLP FREQUENCY: 2x/week   SLP DURATION: 8 weeks   PLANNED INTERVENTIONS: Aspiration precaution training, Pharyngeal strengthening exercises, Diet toleration management , SLP instruction and feedback, Compensatory strategies, and Patient/family education   [address], CCC-SLP 05/26/2023, 11:07 AM

## 2023-05-27 ENCOUNTER — Ambulatory Visit (HOSPITAL_BASED_OUTPATIENT_CLINIC_OR_DEPARTMENT_OTHER)
Admission: RE | Admit: 2023-05-27 | Discharge: 2023-05-27 | Disposition: A | Discharge status: Home / Self Care | Payer: Medicare Other | Source: Ambulatory Visit | Attending: Internal Medicine | Admitting: Internal Medicine

## 2023-05-27 ENCOUNTER — Other Ambulatory Visit: Payer: Self-pay | Admitting: Internal Medicine

## 2023-05-27 DIAGNOSIS — M545 Low back pain, unspecified: Secondary | ICD-10-CM | POA: Insufficient documentation

## 2023-05-27 DIAGNOSIS — M47816 Spondylosis without myelopathy or radiculopathy, lumbar region: Secondary | ICD-10-CM | POA: Diagnosis not present

## 2023-05-27 DIAGNOSIS — I700 Atherosclerosis of aorta: Secondary | ICD-10-CM | POA: Diagnosis not present

## 2023-05-27 DIAGNOSIS — W19XXXA Unspecified fall, initial encounter: Secondary | ICD-10-CM | POA: Insufficient documentation

## 2023-05-27 DIAGNOSIS — M549 Dorsalgia, unspecified: Secondary | ICD-10-CM | POA: Diagnosis not present

## 2023-05-27 DIAGNOSIS — M4185 Other forms of scoliosis, thoracolumbar region: Secondary | ICD-10-CM | POA: Diagnosis not present

## 2023-05-27 DIAGNOSIS — M546 Pain in thoracic spine: Secondary | ICD-10-CM | POA: Diagnosis not present

## 2023-05-27 DIAGNOSIS — M47814 Spondylosis without myelopathy or radiculopathy, thoracic region: Secondary | ICD-10-CM | POA: Diagnosis not present

## 2023-05-27 LAB — URINE CULTURE
MICRO NUMBER:: 15176672
SPECIMEN QUALITY:: ADEQUATE

## 2023-05-28 ENCOUNTER — Other Ambulatory Visit: Payer: Self-pay | Admitting: Internal Medicine

## 2023-05-30 ENCOUNTER — Encounter: Payer: Self-pay | Admitting: Speech Pathology

## 2023-05-30 ENCOUNTER — Ambulatory Visit: Payer: Medicare Other | Admitting: Speech Pathology

## 2023-05-30 ENCOUNTER — Telehealth: Payer: Self-pay | Admitting: Internal Medicine

## 2023-05-30 DIAGNOSIS — E1142 Type 2 diabetes mellitus with diabetic polyneuropathy: Secondary | ICD-10-CM

## 2023-05-30 DIAGNOSIS — R1310 Dysphagia, unspecified: Secondary | ICD-10-CM | POA: Diagnosis not present

## 2023-05-30 DIAGNOSIS — M6281 Muscle weakness (generalized): Secondary | ICD-10-CM | POA: Diagnosis not present

## 2023-05-30 DIAGNOSIS — R41841 Cognitive communication deficit: Secondary | ICD-10-CM

## 2023-05-30 DIAGNOSIS — R296 Repeated falls: Secondary | ICD-10-CM

## 2023-05-30 DIAGNOSIS — R627 Adult failure to thrive: Secondary | ICD-10-CM

## 2023-05-30 DIAGNOSIS — R531 Weakness: Secondary | ICD-10-CM

## 2023-05-30 DIAGNOSIS — R2689 Other abnormalities of gait and mobility: Secondary | ICD-10-CM | POA: Diagnosis not present

## 2023-05-30 DIAGNOSIS — Z9181 History of falling: Secondary | ICD-10-CM | POA: Diagnosis not present

## 2023-05-30 MED ORDER — CIPROFLOXACIN HCL 500 MG PO TABS: 500.0000 mg | ORAL_TABLET | Freq: Two times a day (BID) | ORAL | 0 refills | Status: DC

## 2023-05-30 NOTE — Therapy (Signed)
OUTPATIENT SPEECH LANGUAGE PATHOLOGY TREATMENT NOTE   Patient Name: Ryan Zhang MRN: [id number] DOB:07/11/1946, 77 y.o., male Today's Date: 05/30/2023  PCP: Wanda Plump , MD REFERRING PROVIDER: Vivianne Master  END OF SESSION:   End of Session - 05/30/23 1110     Visit Number 6    Number of Visits 17    Date for SLP Re-Evaluation 07/06/23    SLP Start Time 1103    SLP Stop Time  1143    SLP Time Calculation (min) 40 min    Activity Tolerance Patient tolerated treatment well             Past Medical History:  Diagnosis Date   Allergy    allergy shots Dr. Corinda Gubler   Anemia    Anxiety    Asthma    moderate persistant   Bipolar affective (HCC)    Cataract    both eyes   Clotting disorder (HCC)    Due to blood thinner   COPD (chronic obstructive pulmonary disease) (HCC)    Depression    Diabetes mellitus    DJD (degenerative joint disease)    Dysphagia    Gallstones    hx of, s/p cholecystectomy   GERD (gastroesophageal reflux disease)    Hepatitis A    as teenager 60's   Hollenhorst plaque    right eye   Hyperlipidemia    Hypertension    Kidney stones    hx of   Meniere's disease    Motility disorder, esophageal    Obesity    Pancreatitis    Personal history of colonic polyps 11/2004   hyperplastic.   Stroke [facility])    per MRI   Past Surgical History:  Procedure Laterality Date   CATARACT EXTRACTION Bilateral 09/2015 and 10/2015   CHOLECYSTECTOMY     COLONOSCOPY     EYE SURGERY     JOINT REPLACEMENT     TOTAL KNEE ARTHROPLASTY Bilateral    both knees   Patient Active Problem List   Diagnosis Date Noted   Tinea cruris 07/24/2020   PCP NOTES >>>>>>>>>>>>>>>>>>>>>>>>>>>>>>> 01/12/2016   Dry skin 05/09/2015   Folliculitis 05/09/2015   Hearing loss in right ear 08/05/2014   Pre-ulcerative corn or callous 12/27/2013   Panic attack as reaction to stress 04/20/2012   Dysphagia, neurologic 01/07/2012   Morbid obesity (HCC) 01/07/2012   Chest  pain, musculoskeletal 12/07/2011   Black-out (not amnesia) 12/07/2011   Candidiasis of skin 08/12/2011   Annual physical exam 06/07/2011   OLECRANON BURSITIS 01/29/2011   PARTIAL ARTERIAL OCCLUSION OF RETINA 05/27/2010   Essential hypertension, benign 01/16/2010   ABNORMAL ELECTROCARDIOGRAM 01/14/2010   Asthma 10/14/2009   GERD 10/14/2009   Bipolar disorder (HCC) 02/20/2009   PHIMOSIS 02/20/2009   KNEE PAIN, CHRONIC 11/20/2008   HIP PAIN, RIGHT 08/19/2008   DM type 2 with diabetic peripheral neuropathy (HCC) 02/22/2007   Hyperlipidemia associated with type 2 diabetes mellitus (HCC) 02/22/2007   ALLERGIC RHINITIS, SEASONAL 02/22/2007   LOW BACK PAIN, CHRONIC 02/22/2007   MENIERE'S DISEASE, HX OF 02/22/2007    ONSET DATE: 2015    REFERRING DIAG:   R13.10 (ICD-10-CM) - Dysphagia, unspecified    THERAPY DIAG:  Cognitive communication deficit  Rationale for Evaluation and Treatment: Habilitation  SUBJECTIVE:   SUBJECTIVE STATEMENT: " I added some yumyum sauce to my rice."  PAIN:  Are you having pain? Yes 4/10 Knees  Walking No medicine taken    OBJECTIVE:   TODAY'S  TREATMENT:                                                                                                                                         DATE:   05/30/23: Pt was seen for skilled ST services targeting dysphagia. Pt reports he was lying down in bed, eating sandwich and began coughing with solids (ham and cheese sandwich). Wife told him to sit up and this reportedly stopped coughing. Pt  and wife report with meals he has not been coughing at all with meals using his safe swallow strategies. Pt acknowledges laying down while eating is not a safe eating posture.   Pt completed x5 superglottic swallows with minA cueing from SLP. Pt reports he has difficulty sequencing and runs out of breath. SLP to discontinue exercise at this time, as pt would be more likely to breathe in at the incorrect time  increasing his risk of aspirating. SLP added mendelsohn maneuver to his exercises, demonstrated, and provided a handout. Pt was able to complete x5 with minA verbal cues from SLP.   To discuss d/c next session if pt and wife continue to report significant improvement at meals and SLP believes exercises can be completed at home independently.   05/26/23: Pt was seen for skilled ST services targeting dysphagia. Pt reports he completed the exercises re: CTAR (x30) and hard swallow (x30). He had difficulty completing supraglottic swallow with food. Pt reports it was difficult to initiate swallow sequence with effortful and supraglottic swallows with food/drink. He has been adding moisture to difficult foods (rice) and reports this has been successful. Has been using yogurt to assist with swallowing pills and this has been helpful.   We completed x5 CTAR, x10 effortful swallow, and 3 supraglottic swallow with water.  Patient was able to complete with min assist verbal cues.   05/23/23: Pt was seen for skilled ST services targeting dysphagia. Pt reports he has been more aware of textures giving him difficulty re: dry rice. He reports he is going to find some sort of sauce to provide moisture and create a more cohesive bolus. SLP edu on swallowing exercises for decreased epiglottic inversion, pharyngeal residue, and decreased laryngeal vestibule closure re: supraglottic swallow, effortful swallow, and chin tuck against resistance. Pt was able to demonstrate all exercises with sup.   Reviewed safe swallow strategies and purpose of oral hold/chin tuck as recommended by imaging SLP.   Provided videos and handouts. Pt and wife reported understanding.   Cont with exercises next session. To decrease to x1/week next week.   05/18/23: Pt was seen for skilled ST services targeting dysphagia education on safe swallow strategies. Pt is currently on a regular/thin liquid diet; however, does report difficulty with some  regular textures. SLP introduced IDDSI framework for "Soft and bite" diet to assist with finding modifications for foods (smaller bites, adding sauces/moisture). Also provided pt personalized "Safe  Swallow Strategies" handout. Discussed important strategies re: effortful swallow, small bites/sips, 2 swallows per bite, check mouth for residue post meal, listen for change in vocal quality and throat clear. To discuss exercises next session.    05/16/23: Pt was seen for skilled ST services targeting continued dysphagia evaluation and treatment. SLP provided extensive edu on aspiration and the three pillars of aspiration. Handout was provided. Pt and wife have no further questions at this time.   Completed CSE. See below for details. Effortful swallow + chin tuck were successful in clearing pt's feeling of food sticking in throat. Liquid wash was also successful; though, pt coughed immediately following. Pt was noted to throw head back while drinking. SLP cued pt to keep neutral head positioning when eating/drinking to reduce risk of aspiration. To provided safe swallow strategies next session with handout. Also to edu on soft+bite diet.   CLINICAL SWALLOW ASSESSMENT:   Current diet: regular and thin liquids Dentition: adequate natural dentition Patient directly observed with POs: Yes; sandwich, chips, fruit, water Feeding: able to feed self Liquids provided by:  Cup/water bottle  Oral phase signs and symptoms:  moderately prolonged mastication noted with sandwich, mildly prolonged mastication with chips and fruit  Pharyngeal phase signs and symptoms:  -Solids: multiple swallows to clear suspected "residue"; pt verbalized food feeling stuck.  -Liquids: Immediate and delayed coughs noted with liquids, vocal quality change with x2/5 drinks        PATIENT EDUCATION: Education details: dysphagia, MBS results Person educated: Patient and Spouse Education method: Explanation and Handouts Education  comprehension: needs further education     ASSESSMENT:   CLINICAL IMPRESSION: Pt is a 77 yo male who presents to ST OP for evaluation post referral from ENT. Pt endorses difficulty swallowing since 2015. Reports hx of CVA in 2013; however, no residual deficits. He has difficulty swallowing solids, liquids, and pills. Pt and wife feel like they were not provided adequate education regarding swallowing deficits. SLP printed MBS report and provided thorough education regarding his moderate pharyngeal dysphagia diagnosis. To cont next session. Pt swallowing to be assessed next session - pt to bring in food and liquid. SLP rec skilled ST services to address dysphagia.     OBJECTIVE IMPAIRMENTS: include dysphagia. These impairments are limiting patient from safety when swallowing. Factors affecting potential to achieve goals and functional outcome are  na . Patient will benefit from skilled SLP services to address above impairments and improve overall function.   REHAB POTENTIAL: Good     GOALS: Goals reviewed with patient? Yes   SHORT TERM GOALS: Target date: 06/05/23   Pt/wife will recall 3 swallow strategies to increase patient safety while eating. Baseline: Goal status: PROGRESSING   2.  Pt will tolerate regular diet without overt s/x of aspiration when following safe swallow strategies. Baseline:  Goal status: PROGRESSING     LONG TERM GOALS: Target date: 07/06/23   Pt will increase score on EAT-10. Baseline:  Goal status: INITIAL   2.  Pt/wife will report increased swallow safety and less coughing events while eating. Baseline:  Goal status: INITIAL       PLAN:   SLP FREQUENCY: 2x/week   SLP DURATION: 8 weeks   PLANNED INTERVENTIONS: Aspiration precaution training, Pharyngeal strengthening exercises, Diet toleration management , SLP instruction and feedback, Compensatory strategies, and Patient/family education   [address], CCC-SLP 05/30/2023, 11:12 AM

## 2023-05-30 NOTE — Telephone Encounter (Signed)
HH referral placed. 

## 2023-05-30 NOTE — Telephone Encounter (Signed)
Rep from [facility] wanted to go over pt's most recent visit. She stated he had been seen in the past but this most recent visit he has declined in overall strength. She stated he has a lot of weakness and loss of balance and it is very difficult for him to complete PT at their facility. She states he has fallen over 9 times since February and she thinks he would be better suited doing HH PT.

## 2023-05-30 NOTE — Addendum Note (Signed)
Addended byConrad Lakeland D on: 05/30/2023 08:29 AM   Modules accepted: Orders

## 2023-05-30 NOTE — Telephone Encounter (Signed)
Please advise 

## 2023-05-30 NOTE — Telephone Encounter (Signed)
Okay, let know the patient, proceed with [facility] PT

## 2023-06-01 DIAGNOSIS — J3089 Other allergic rhinitis: Secondary | ICD-10-CM | POA: Diagnosis not present

## 2023-06-01 DIAGNOSIS — J301 Allergic rhinitis due to pollen: Secondary | ICD-10-CM | POA: Diagnosis not present

## 2023-06-01 DIAGNOSIS — J3081 Allergic rhinitis due to animal (cat) (dog) hair and dander: Secondary | ICD-10-CM | POA: Diagnosis not present

## 2023-06-02 ENCOUNTER — Ambulatory Visit: Payer: Medicare Other | Admitting: Speech Pathology

## 2023-06-04 ENCOUNTER — Other Ambulatory Visit: Payer: Self-pay | Admitting: Internal Medicine

## 2023-06-06 ENCOUNTER — Ambulatory Visit: Payer: Medicare Other | Admitting: Speech Pathology

## 2023-06-06 DIAGNOSIS — R1310 Dysphagia, unspecified: Secondary | ICD-10-CM

## 2023-06-06 NOTE — Therapy (Signed)
OUTPATIENT SPEECH LANGUAGE PATHOLOGY TREATMENT NOTE   Patient Name: Ryan Zhang MRN: [id number] DOB:[birth year], 77 y.o., male Today's Date: 06/06/2023  PCP: Wanda Plump , MD REFERRING PROVIDER: Aleene Davidson Kermit Balo  SPEECH THERAPY DISCHARGE SUMMARY  Visits from Start of Care: 7  Current functional level related to goals / functional outcomes: Pt has met all goals. Wife and pt are satisfied with current level. They are aware when/how to reach out to doctor with any changes regarding to swallowing.    Remaining deficits: See imaging. Pt and wife report decreased overt s/sx of aspiration with meals. Pt understands that he continues to be at risk to develop aspiration PNA due to his oral health, immune system, and silent aspiration.    Education / Equipment: Completd   Patient agrees to discharge. Patient goals were met. Patient is being discharged due to meeting the stated rehab goals..     END OF SESSION:   End of Session - 06/06/23 1109     Visit Number 7    Number of Visits 17    Date for SLP Re-Evaluation 07/06/23    SLP Start Time 1105    SLP Stop Time  1130    SLP Time Calculation (min) 25 min    Activity Tolerance Patient tolerated treatment well             Past Medical History:  Diagnosis Date   Allergy    allergy shots Dr. Corinda Gubler   Anemia    Anxiety    Asthma    moderate persistant   Bipolar affective (HCC)    Cataract    both eyes   Clotting disorder (HCC)    Due to blood thinner   COPD (chronic obstructive pulmonary disease) (HCC)    Depression    Diabetes mellitus    DJD (degenerative joint disease)    Dysphagia    Gallstones    hx of, s/p cholecystectomy   GERD (gastroesophageal reflux disease)    Hepatitis A    as teenager 60's   Hollenhorst plaque    right eye   Hyperlipidemia    Hypertension    Kidney stones    hx of   Meniere's disease    Motility disorder, esophageal    Obesity    Pancreatitis    Personal history of  colonic polyps 11/2004   hyperplastic.   Stroke [facility])    per MRI   Past Surgical History:  Procedure Laterality Date   CATARACT EXTRACTION Bilateral 09/2015 and 10/2015   CHOLECYSTECTOMY     COLONOSCOPY     EYE SURGERY     JOINT REPLACEMENT     TOTAL KNEE ARTHROPLASTY Bilateral    both knees   Patient Active Problem List   Diagnosis Date Noted   Tinea cruris 07/24/2020   PCP NOTES >>>>>>>>>>>>>>>>>>>>>>>>>>>>>>> 01/12/2016   Dry skin 05/09/2015   Folliculitis 05/09/2015   Hearing loss in right ear 08/05/2014   Pre-ulcerative corn or callous 12/27/2013   Panic attack as reaction to stress 04/20/2012   Dysphagia, neurologic 01/07/2012   Morbid obesity (HCC) 01/07/2012   Chest pain, musculoskeletal 12/07/2011   Black-out (not amnesia) 12/07/2011   Candidiasis of skin 08/12/2011   Annual physical exam 06/07/2011   OLECRANON BURSITIS 01/29/2011   PARTIAL ARTERIAL OCCLUSION OF RETINA 05/27/2010   Essential hypertension, benign 01/16/2010   ABNORMAL ELECTROCARDIOGRAM 01/14/2010   Asthma 10/14/2009   GERD 10/14/2009   Bipolar disorder (HCC) 02/20/2009   PHIMOSIS 02/20/2009   KNEE  PAIN, CHRONIC 11/20/2008   HIP PAIN, RIGHT 08/19/2008   DM type 2 with diabetic peripheral neuropathy (HCC) 02/22/2007   Hyperlipidemia associated with type 2 diabetes mellitus (HCC) 02/22/2007   ALLERGIC RHINITIS, SEASONAL 02/22/2007   LOW BACK PAIN, CHRONIC 02/22/2007   MENIERE'S DISEASE, HX OF 02/22/2007    ONSET DATE: 2015    REFERRING DIAG:   R13.10 (ICD-10-CM) - Dysphagia, unspecified    THERAPY DIAG:  Dysphagia, unspecified type  Rationale for Evaluation and Treatment: Habilitation  SUBJECTIVE:   SUBJECTIVE STATEMENT: " I feel like I am doing good."  PAIN:  Are you having pain? Yes 4/10 Knees  Walking No medicine taken    OBJECTIVE:   TODAY'S TREATMENT:                                                                                                                                          DATE:   06/06/23: Pt was seen for skilled ST services targeting dysphagia. Pt was observed with solids and liquids this session for diet check. Pt brought ham sandwich and chips. SLP observed mildly prolonged mastication with sandwich; however, this did not impact his swallow. Pt required liquid wash with sandwich to clear suspected residue from throat. Pt also benefited from an effortful swallow to clear feelings of pharyngeal residue. Pt and wife were able to verbalize safe swallow strategies for use at home. Pt is able to complete all given exercises independently.   Pt completed EAT-10 in which he scored a 4 indicating decreased negative feelings regarding swallowing. Initial score was a 14 indicating improvement over therapy course.   Pt and wife in agreement with discharge at this time. Pt is also beginning HH PT. They understand if there are any changes, they need to reach out to doctor for next steps.   05/30/23: Pt was seen for skilled ST services targeting dysphagia. Pt reports he was lying down in bed, eating sandwich and began coughing with solids (ham and cheese sandwich). Wife told him to sit up and this reportedly stopped coughing. Pt  and wife report with meals he has not been coughing at all with meals using his safe swallow strategies. Pt acknowledges laying down while eating is not a safe eating posture.   Pt completed x5 superglottic swallows with minA cueing from SLP. Pt reports he has difficulty sequencing and runs out of breath. SLP to discontinue exercise at this time, as pt would be more likely to breathe in at the incorrect time increasing his risk of aspirating. SLP added mendelsohn maneuver to his exercises, demonstrated, and provided a handout. Pt was able to complete x5 with minA verbal cues from SLP.   To discuss d/c next session if pt and wife continue to report significant improvement at meals and SLP believes exercises can be completed at home  independently.   05/26/23: Pt was seen for skilled ST services  targeting dysphagia. Pt reports he completed the exercises re: CTAR (x30) and hard swallow (x30). He had difficulty completing supraglottic swallow with food. Pt reports it was difficult to initiate swallow sequence with effortful and supraglottic swallows with food/drink. He has been adding moisture to difficult foods (rice) and reports this has been successful. Has been using yogurt to assist with swallowing pills and this has been helpful.   We completed x5 CTAR, x10 effortful swallow, and 3 supraglottic swallow with water.  Patient was able to complete with min assist verbal cues.   05/23/23: Pt was seen for skilled ST services targeting dysphagia. Pt reports he has been more aware of textures giving him difficulty re: dry rice. He reports he is going to find some sort of sauce to provide moisture and create a more cohesive bolus. SLP edu on swallowing exercises for decreased epiglottic inversion, pharyngeal residue, and decreased laryngeal vestibule closure re: supraglottic swallow, effortful swallow, and chin tuck against resistance. Pt was able to demonstrate all exercises with sup.   Reviewed safe swallow strategies and purpose of oral hold/chin tuck as recommended by imaging SLP.   Provided videos and handouts. Pt and wife reported understanding.   Cont with exercises next session. To decrease to x1/week next week.   05/18/23: Pt was seen for skilled ST services targeting dysphagia education on safe swallow strategies. Pt is currently on a regular/thin liquid diet; however, does report difficulty with some regular textures. SLP introduced IDDSI framework for "Soft and bite" diet to assist with finding modifications for foods (smaller bites, adding sauces/moisture). Also provided pt personalized "Safe Swallow Strategies" handout. Discussed important strategies re: effortful swallow, small bites/sips, 2 swallows per bite, check mouth for  residue post meal, listen for change in vocal quality and throat clear. To discuss exercises next session.    05/16/23: Pt was seen for skilled ST services targeting continued dysphagia evaluation and treatment. SLP provided extensive edu on aspiration and the three pillars of aspiration. Handout was provided. Pt and wife have no further questions at this time.   Completed CSE. See below for details. Effortful swallow + chin tuck were successful in clearing pt's feeling of food sticking in throat. Liquid wash was also successful; though, pt coughed immediately following. Pt was noted to throw head back while drinking. SLP cued pt to keep neutral head positioning when eating/drinking to reduce risk of aspiration. To provided safe swallow strategies next session with handout. Also to edu on soft+bite diet.   CLINICAL SWALLOW ASSESSMENT:   Current diet: regular and thin liquids Dentition: adequate natural dentition Patient directly observed with POs: Yes; sandwich, chips, fruit, water Feeding: able to feed self Liquids provided by:  Cup/water bottle  Oral phase signs and symptoms:  moderately prolonged mastication noted with sandwich, mildly prolonged mastication with chips and fruit  Pharyngeal phase signs and symptoms:  -Solids: multiple swallows to clear suspected "residue"; pt verbalized food feeling stuck.  -Liquids: Immediate and delayed coughs noted with liquids, vocal quality change with x2/5 drinks        PATIENT EDUCATION: Education details: dysphagia, MBS results Person educated: Patient and Spouse Education method: Explanation and Handouts Education comprehension: needs further education     ASSESSMENT:   CLINICAL IMPRESSION: Pt is a 77 yo male who presents to ST OP for evaluation post referral from ENT. Pt endorses difficulty swallowing since 2015. Reports hx of CVA in 2013; however, no residual deficits. He has difficulty swallowing solids, liquids, and pills. Pt and wife  feel like they were not provided adequate education regarding swallowing deficits. SLP printed MBS report and provided thorough education regarding his moderate pharyngeal dysphagia diagnosis. To cont next session. Pt swallowing to be assessed next session - pt to bring in food and liquid. SLP rec skilled ST services to address dysphagia.     OBJECTIVE IMPAIRMENTS: include dysphagia. These impairments are limiting patient from safety when swallowing. Factors affecting potential to achieve goals and functional outcome are  na . Patient will benefit from skilled SLP services to address above impairments and improve overall function.   REHAB POTENTIAL: Good     GOALS: Goals reviewed with patient? Yes   SHORT TERM GOALS: Target date: 06/05/23   Pt/wife will recall 3 swallow strategies to increase patient safety while eating. Baseline: Goal status: MET   2.  Pt will tolerate regular diet without overt s/x of aspiration when following safe swallow strategies. Baseline:  Goal status: NOT MET     LONG TERM GOALS: Target date: 07/06/23   Pt will increase score on EAT-10. Baseline:  Goal status:  MET   2.  Pt/wife will report increased swallow safety and less coughing events while eating. Baseline:  Goal status: MET       PLAN:   SLP FREQUENCY: 2x/week   SLP DURATION: 8 weeks   PLANNED INTERVENTIONS: Aspiration precaution training, Pharyngeal strengthening exercises, Diet toleration management , SLP instruction and feedback, Compensatory strategies, and Patient/family education   [address], CCC-SLP 06/06/2023, 11:10 AM

## 2023-06-07 ENCOUNTER — Encounter: Payer: Self-pay | Admitting: Internal Medicine

## 2023-06-07 ENCOUNTER — Telehealth: Payer: Self-pay

## 2023-06-07 ENCOUNTER — Ambulatory Visit (INDEPENDENT_AMBULATORY_CARE_PROVIDER_SITE_OTHER): Payer: Medicare Other | Admitting: Internal Medicine

## 2023-06-07 VITALS — BP 126/68 | HR 78 | Temp 97.7°F | Resp 16 | Ht 70.0 in | Wt 244.1 lb

## 2023-06-07 DIAGNOSIS — R296 Repeated falls: Secondary | ICD-10-CM | POA: Diagnosis not present

## 2023-06-07 DIAGNOSIS — R42 Dizziness and giddiness: Secondary | ICD-10-CM

## 2023-06-07 DIAGNOSIS — R269 Unspecified abnormalities of gait and mobility: Secondary | ICD-10-CM | POA: Diagnosis not present

## 2023-06-07 NOTE — Patient Instructions (Signed)
We are referring you to neurology for evaluation of dizziness  Please use a walker consistently to prevent falls  Come back in 3 months

## 2023-06-07 NOTE — Progress Notes (Unsigned)
Subjective:    Patient ID: Ryan Zhang, male    DOB: 1946-05-19, 77 y.o.   MRN: [id number]  DOS:  06/07/2023 Type of visit - description: Here with his wife, follow-up from previous visit.  See LOV, had a fall, no further incidents however he had a near fall while in our waiting room.  Continue with random episodes of dizziness as described before  Continue to have a very hard time ambulating.  He complained of back pain, has improved some. Denies fever, bladder or bowel incontinence.        Review of Systems See above   Past Medical History:  Diagnosis Date   Allergy    allergy shots Dr. Corinda Gubler   Anemia    Anxiety    Asthma    moderate persistant   Bipolar affective (HCC)    Cataract    both eyes   Clotting disorder (HCC)    Due to blood thinner   COPD (chronic obstructive pulmonary disease) (HCC)    Depression    Diabetes mellitus    DJD (degenerative joint disease)    Dysphagia    Gallstones    hx of, s/p cholecystectomy   GERD (gastroesophageal reflux disease)    Hepatitis A    as teenager 60's   Hollenhorst plaque    right eye   Hyperlipidemia    Hypertension    Kidney stones    hx of   Meniere's disease    Motility disorder, esophageal    Obesity    Pancreatitis    Personal history of colonic polyps 11/2004   hyperplastic.   Stroke [facility])    per MRI    Past Surgical History:  Procedure Laterality Date   CATARACT EXTRACTION Bilateral 09/2015 and 10/2015   CHOLECYSTECTOMY     COLONOSCOPY     EYE SURGERY     JOINT REPLACEMENT     TOTAL KNEE ARTHROPLASTY Bilateral    both knees    Current Outpatient Medications  Medication Instructions   acetaminophen (TYLENOL) 650 mg, Oral, Every 6 hours PRN   albuterol (PROVENTIL) 2.5 mg, Nebulization, Every 6 hours PRN   albuterol (VENTOLIN HFA) 108 (90 Base) MCG/ACT inhaler 2 puffs, Inhalation, Every 6 hours PRN   augmented betamethasone dipropionate (DIPROLENE-AF) 0.05 % cream Topical, 2 times  daily   budesonide-formoterol (SYMBICORT) 160-4.5 MCG/ACT inhaler 2 puffs, Inhalation, 2 times daily   buPROPion (WELLBUTRIN XL) 150 mg, Oral, Daily   ciprofloxacin (CIPRO) 500 mg, Oral, 2 times daily   clonazePAM (KLONOPIN) 0.5 mg, Oral, See admin instructions, #2 at 8am, #1 at 2pm ([name])   clopidogrel (PLAVIX) 75 mg, Oral, Daily   DULoxetine (CYMBALTA) 60 mg, Oral, BH-each morning   EPIPEN 2-PAK 0.3 MG/0.3ML SOAJ injection Reported on 04/29/2016   esomeprazole (NEXIUM) 40 mg, Oral, BH-each morning   fexofenadine (ALLEGRA) 180 mg, Oral, Every morning, 8am [name])   fluticasone (VERAMYST) 27.5 MCG/SPRAY nasal spray 2 sprays, Nasal, Daily PRN   lamoTRIgine (LAMICTAL) 50 mg, Oral, Daily at bedtime   Lancets (ONETOUCH ULTRASOFT) lancets Check blood sugars no more than twice daily   losartan (COZAAR) 25 mg, Oral, Every evening   montelukast (SINGULAIR) 10 mg, Oral, Daily at bedtime, [name]    Multiple Vitamin (MULTIVITAMIN) tablet 1 tablet, Oral, Daily   nystatin (MYCOSTATIN/NYSTOP) powder 1 application , Topical, 3 times daily   nystatin ointment (MYCOSTATIN) APPLY TOPICALLY TWICE A DAY AS NEEDED   ONETOUCH VERIO test strip use to check blood  sugar no more than twice daily   pioglitazone-metformin (ACTOPLUS MET) 15-850 MG tablet 1 tablet, Oral, 2 times daily with meals   simvastatin (ZOCOR) 40 mg, Oral, Daily at bedtime   solifenacin (VESICARE) 10 mg, Oral, Daily       Objective:   Physical Exam BP 126/68   Pulse 78   Temp 97.7 F (36.5 C) (Oral)   Resp 16   Ht 5\' 10"  (1.778 m)   Wt 244 lb 2 oz (110.7 kg)   SpO2 95%   BMI 35.03 kg/m  General:   Well developed, NAD, BMI noted. HEENT:  Normocephalic . Face symmetric, atraumatic   Skin: Not pale. Not jaundice Neurologic:  alert & oriented X3.  Speech normal, patient sits in a wheelchair.  Upper extremities motor is appropriate for age. Lower extremity motor: Weak but symmetric. Psych--  Cognition and judgment  appear intact.  Cooperative with normal attention span and concentration.  Behavior appropriate. No anxious or depressed appearing.      Assessment   Assessment DM, mild neuropathy HTN Hyperlipidemia Morbid obesity (BMI 39 plus DM) Bipolar, Depression, cognitive issues after ECT 2007 (on disability) per   psychiatry, Dr Donell Beers DJD-- hydrocodone rarely rx by [place] ortho Asthma-Allergies ------------ Dr [name]  GERD, h/o dysphagia (chronic, neurogenic-transfer dysphagia? See GI note 12-2011) Neuro: --? stroke : saw neuro 2013 d/t B transient visual loss, ASA changed to plavix.  ---Saw neuro 11-2013: fall, syncope,parkinsonism d/t sapharis? CV: Enlarged ascending Ao , + Ao atherosclerosis  --per CT 12-2015,CT chest 08-2016 stable.    --MRI chest 10/2017 stable, no routine f/u Nl LE arterial dopplers 2015 Carotid artery disease: Per [place]  2011, last [place] 2018: <50% B,   08/2019 < 1-39% B, 09-2021 < 39%, stable consider recheck in 2 years  CT chest  ---RML  8mm: 07-2014, CT 12-2015, CT 08-2016, MRI chest 12,2018, CT chest 07/2018:Stable  HOH: L deaf L, R  hearing aid H/o Mnire's disease  Iron deficiency anemia: Noted 01/2018, + Hemoccult, 09-2018: Colonoscopy and EGD were completely normal  Urology- on Vesicare, sees urology  PLAN: Frequent falls, gait disorder. Since the last visit, fortunately did not have another fall.  They live on a first floor unit,   house is not wheelchair ready bu the acces is not difficult.  Etiology of falls and gait disordered is likely multifactorial (aging, neuropathy, BMI, sedentary system, loss of muscle mass).  As the day goes by his legs get weaker and weaker to the point that they essentially cannot hold him. Pt's  wife reports that he has been somewhat inactive for the last few months, and that is the reason why he lost ground which makes sense. Outpatient PT recommended home PT which will start in few days. He already got a walker.  May need a  wheelchair soon. They won't be able to afford a ALF, they declined at the social worker referral. Dizziness: Having random episodes of dizziness, etiology not completely clear to me, referred to neurology. Low back pain: Improved, x-ray did not show anything acute. UTI: Did have a UTI, finishing treatment with Cipro, that has not improved his overall situation. RTC 3 months

## 2023-06-07 NOTE — Telephone Encounter (Signed)
Spoke w/ Marylene Land, PT at [facility] at [phone]- they are scheduled to start [facility] services w/ Pt on 06/09/23 in the afternoon. Pt had to finish outpatient ST (which ended yesterday) first.

## 2023-06-08 DIAGNOSIS — J3089 Other allergic rhinitis: Secondary | ICD-10-CM | POA: Diagnosis not present

## 2023-06-08 DIAGNOSIS — J301 Allergic rhinitis due to pollen: Secondary | ICD-10-CM | POA: Diagnosis not present

## 2023-06-08 NOTE — Assessment & Plan Note (Signed)
Frequent falls, gait disorder. Since the last visit, fortunately did not have another fall.  They live on a first floor unit,   house is not wheelchair ready but the acces is not difficult.  As the day goes by his legs get weaker and weaker to the point that they essentially cannot hold him. Etiology of falls and gait disordered is likely multifactorial (aging, neuropathy, BMI, sedentary system, loss of muscle mass).   Pt's  wife reports that he has been somewhat inactive for the last few months, and that is the reason why he lost ground which makes sense. Outpatient PT recommended home PT which will start in few days. He already got a walker.  May need a wheelchair soon. They won't be able to afford a ALF, they declined a Child psychotherapist referral. Dizziness: Having random episodes of dizziness, etiology not completely clear to me, referred to neurology. Low back pain: Improved, x-ray did not show anything acute. UTI: Did have a UTI, finishing treatment with Cipro, treatment did not improve his overall situation. RTC 3 months

## 2023-06-09 ENCOUNTER — Ambulatory Visit: Payer: Medicare Other | Admitting: Speech Pathology

## 2023-06-09 DIAGNOSIS — J4489 Other specified chronic obstructive pulmonary disease: Secondary | ICD-10-CM | POA: Diagnosis not present

## 2023-06-09 DIAGNOSIS — I10 Essential (primary) hypertension: Secondary | ICD-10-CM | POA: Diagnosis not present

## 2023-06-09 DIAGNOSIS — E1142 Type 2 diabetes mellitus with diabetic polyneuropathy: Secondary | ICD-10-CM | POA: Diagnosis not present

## 2023-06-10 ENCOUNTER — Encounter: Payer: Self-pay | Admitting: Neurology

## 2023-06-10 DIAGNOSIS — I10 Essential (primary) hypertension: Secondary | ICD-10-CM | POA: Diagnosis not present

## 2023-06-10 DIAGNOSIS — E1142 Type 2 diabetes mellitus with diabetic polyneuropathy: Secondary | ICD-10-CM | POA: Diagnosis not present

## 2023-06-10 DIAGNOSIS — J4489 Other specified chronic obstructive pulmonary disease: Secondary | ICD-10-CM | POA: Diagnosis not present

## 2023-06-13 ENCOUNTER — Ambulatory Visit: Payer: Medicare Other | Admitting: Speech Pathology

## 2023-06-15 DIAGNOSIS — J301 Allergic rhinitis due to pollen: Secondary | ICD-10-CM | POA: Diagnosis not present

## 2023-06-15 DIAGNOSIS — J3081 Allergic rhinitis due to animal (cat) (dog) hair and dander: Secondary | ICD-10-CM | POA: Diagnosis not present

## 2023-06-15 DIAGNOSIS — J3089 Other allergic rhinitis: Secondary | ICD-10-CM | POA: Diagnosis not present

## 2023-06-16 ENCOUNTER — Telehealth: Payer: Self-pay | Admitting: Internal Medicine

## 2023-06-16 ENCOUNTER — Ambulatory Visit: Payer: Medicare Other | Admitting: Speech Pathology

## 2023-06-16 DIAGNOSIS — J4489 Other specified chronic obstructive pulmonary disease: Secondary | ICD-10-CM | POA: Diagnosis not present

## 2023-06-16 DIAGNOSIS — I10 Essential (primary) hypertension: Secondary | ICD-10-CM | POA: Diagnosis not present

## 2023-06-16 DIAGNOSIS — E1142 Type 2 diabetes mellitus with diabetic polyneuropathy: Secondary | ICD-10-CM | POA: Diagnosis not present

## 2023-06-16 NOTE — Telephone Encounter (Signed)
Noted  

## 2023-06-16 NOTE — Telephone Encounter (Signed)
[facility] called and states patient feels he does not need OT at this time. She they will discontinue. Just an [place]

## 2023-06-17 ENCOUNTER — Telehealth: Payer: Self-pay

## 2023-06-17 DIAGNOSIS — F419 Anxiety disorder, unspecified: Secondary | ICD-10-CM

## 2023-06-17 DIAGNOSIS — Z7902 Long term (current) use of antithrombotics/antiplatelets: Secondary | ICD-10-CM | POA: Diagnosis not present

## 2023-06-17 DIAGNOSIS — I10 Essential (primary) hypertension: Secondary | ICD-10-CM | POA: Diagnosis not present

## 2023-06-17 DIAGNOSIS — Z6834 Body mass index (BMI) 34.0-34.9, adult: Secondary | ICD-10-CM

## 2023-06-17 DIAGNOSIS — D509 Iron deficiency anemia, unspecified: Secondary | ICD-10-CM | POA: Diagnosis not present

## 2023-06-17 DIAGNOSIS — E1142 Type 2 diabetes mellitus with diabetic polyneuropathy: Secondary | ICD-10-CM | POA: Diagnosis not present

## 2023-06-17 DIAGNOSIS — Z9181 History of falling: Secondary | ICD-10-CM | POA: Diagnosis not present

## 2023-06-17 DIAGNOSIS — F319 Bipolar disorder, unspecified: Secondary | ICD-10-CM

## 2023-06-17 DIAGNOSIS — J4489 Other specified chronic obstructive pulmonary disease: Secondary | ICD-10-CM | POA: Diagnosis not present

## 2023-06-17 NOTE — Telephone Encounter (Signed)
Plan of care signed (order number [id number]) and faxed back to [facility] at [phone].  Physician order form (order number [id number]) signed and faxed back to number above.

## 2023-06-21 DIAGNOSIS — I10 Essential (primary) hypertension: Secondary | ICD-10-CM | POA: Diagnosis not present

## 2023-06-21 DIAGNOSIS — E1142 Type 2 diabetes mellitus with diabetic polyneuropathy: Secondary | ICD-10-CM | POA: Diagnosis not present

## 2023-06-21 DIAGNOSIS — J4489 Other specified chronic obstructive pulmonary disease: Secondary | ICD-10-CM | POA: Diagnosis not present

## 2023-06-22 DIAGNOSIS — J3081 Allergic rhinitis due to animal (cat) (dog) hair and dander: Secondary | ICD-10-CM | POA: Diagnosis not present

## 2023-06-22 DIAGNOSIS — J3089 Other allergic rhinitis: Secondary | ICD-10-CM | POA: Diagnosis not present

## 2023-06-22 DIAGNOSIS — J301 Allergic rhinitis due to pollen: Secondary | ICD-10-CM | POA: Diagnosis not present

## 2023-06-23 ENCOUNTER — Ambulatory Visit: Payer: Medicare Other | Admitting: Neurology

## 2023-06-23 DIAGNOSIS — I10 Essential (primary) hypertension: Secondary | ICD-10-CM | POA: Diagnosis not present

## 2023-06-23 DIAGNOSIS — J4489 Other specified chronic obstructive pulmonary disease: Secondary | ICD-10-CM | POA: Diagnosis not present

## 2023-06-23 DIAGNOSIS — E1142 Type 2 diabetes mellitus with diabetic polyneuropathy: Secondary | ICD-10-CM | POA: Diagnosis not present

## 2023-06-27 DIAGNOSIS — E1142 Type 2 diabetes mellitus with diabetic polyneuropathy: Secondary | ICD-10-CM | POA: Diagnosis not present

## 2023-06-27 DIAGNOSIS — I10 Essential (primary) hypertension: Secondary | ICD-10-CM | POA: Diagnosis not present

## 2023-06-27 DIAGNOSIS — J4489 Other specified chronic obstructive pulmonary disease: Secondary | ICD-10-CM | POA: Diagnosis not present

## 2023-06-29 ENCOUNTER — Other Ambulatory Visit: Payer: Self-pay | Admitting: Internal Medicine

## 2023-06-29 DIAGNOSIS — J3081 Allergic rhinitis due to animal (cat) (dog) hair and dander: Secondary | ICD-10-CM | POA: Diagnosis not present

## 2023-06-29 DIAGNOSIS — J3089 Other allergic rhinitis: Secondary | ICD-10-CM | POA: Diagnosis not present

## 2023-06-29 DIAGNOSIS — J301 Allergic rhinitis due to pollen: Secondary | ICD-10-CM | POA: Diagnosis not present

## 2023-07-01 DIAGNOSIS — J4489 Other specified chronic obstructive pulmonary disease: Secondary | ICD-10-CM | POA: Diagnosis not present

## 2023-07-01 DIAGNOSIS — I10 Essential (primary) hypertension: Secondary | ICD-10-CM | POA: Diagnosis not present

## 2023-07-01 DIAGNOSIS — E1142 Type 2 diabetes mellitus with diabetic polyneuropathy: Secondary | ICD-10-CM | POA: Diagnosis not present

## 2023-07-05 DIAGNOSIS — J4489 Other specified chronic obstructive pulmonary disease: Secondary | ICD-10-CM | POA: Diagnosis not present

## 2023-07-05 DIAGNOSIS — E1142 Type 2 diabetes mellitus with diabetic polyneuropathy: Secondary | ICD-10-CM | POA: Diagnosis not present

## 2023-07-05 DIAGNOSIS — I10 Essential (primary) hypertension: Secondary | ICD-10-CM | POA: Diagnosis not present

## 2023-07-06 ENCOUNTER — Encounter: Payer: Medicare Other | Admitting: Internal Medicine

## 2023-07-06 DIAGNOSIS — J301 Allergic rhinitis due to pollen: Secondary | ICD-10-CM | POA: Diagnosis not present

## 2023-07-06 DIAGNOSIS — J3089 Other allergic rhinitis: Secondary | ICD-10-CM | POA: Diagnosis not present

## 2023-07-07 ENCOUNTER — Encounter: Payer: Self-pay | Admitting: Internal Medicine

## 2023-07-07 DIAGNOSIS — E1142 Type 2 diabetes mellitus with diabetic polyneuropathy: Secondary | ICD-10-CM | POA: Diagnosis not present

## 2023-07-07 DIAGNOSIS — J4489 Other specified chronic obstructive pulmonary disease: Secondary | ICD-10-CM | POA: Diagnosis not present

## 2023-07-07 DIAGNOSIS — I10 Essential (primary) hypertension: Secondary | ICD-10-CM | POA: Diagnosis not present

## 2023-07-11 MED ORDER — FREESTYLE LIBRE 3 SENSOR MISC: 3 refills | Status: DC

## 2023-07-11 MED ORDER — FREESTYLE LIBRE 3 READER DEVI: 0 refills | Status: DC

## 2023-07-11 NOTE — Telephone Encounter (Signed)
Please send freestyle libre 3 CGM prescription

## 2023-07-11 NOTE — Addendum Note (Signed)
Addended byConrad Olivia D on: 07/11/2023 01:06 PM   Modules accepted: Orders

## 2023-07-11 NOTE — Telephone Encounter (Signed)
Rx sent 

## 2023-07-12 NOTE — Progress Notes (Unsigned)
Assessment/Plan:   DaTscan done in 2014 was unremarkable.  Gait instability with falls  -Medication certainly could contribute.  He is on several medications that could contribute to falls, including clonazepam and Vesicare *** Subjective:   Ryan Zhang was seen today in neurologic consultation at the request of Wanda Plump, MD.  The consultation is for the evaluation of dizziness.  I saw the patient almost a decade ago for something similar.  He was seen for near syncope and a history of chronic vertigo.  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  His DaTscan was negative.  We also wondered if his syncope was related to orthostasis.  I have not seen him since 2015.   Last neuroimaging of the brain was May 24, 2023 and that was a CT brain after a fall.  And this was unremarkable.   PREVIOUS MEDICATIONS: ***  ALLERGIES:   Allergies  Allergen Reactions   Celexa  [Citalopram Hydrobromide] Other (See Comments)   Depakote  [Divalproex Sodium] Other (See Comments)   Methocarbamol     Other reaction(s): Hallucinations   Paroxetine Hcl     Other reaction(s): Insomnia   Smz-Tmp Ds [Sulfamethoxazole-Trimethoprim] Nausea And Vomiting   Other     "Symbrax" alteration in blood sugar, pt unsure if low or high blood sugar   Oxycodone-Acetaminophen     Other reaction(s): Unknown   Risperidone Other (See Comments)    REACTION: hyper    CURRENT MEDICATIONS:  Outpatient Encounter Medications as of 07/14/2023  Medication Sig   acetaminophen (TYLENOL) 325 MG tablet Take 2 tablets (650 mg total) by mouth every 6 (six) hours as needed (mild pain; headache).   albuterol (PROVENTIL) (2.5 MG/3ML) 0.083% nebulizer solution Take 3 mLs (2.5 mg total) by nebulization every 6 (six) hours as needed for wheezing or shortness of breath.   albuterol (VENTOLIN HFA) 108 (90 Base) MCG/ACT inhaler Inhale 2 puffs into the lungs every 6 (six)  hours as needed for wheezing or shortness of breath.   augmented betamethasone dipropionate (DIPROLENE-AF) 0.05 % cream Apply topically 2 (two) times daily.   budesonide-formoterol (SYMBICORT) 160-4.5 MCG/ACT inhaler Inhale 2 puffs into the lungs 2 (two) times daily.    buPROPion (WELLBUTRIN XL) 150 MG 24 hr tablet Take 150 mg by mouth daily.   ciprofloxacin (CIPRO) 500 MG tablet Take 1 tablet (500 mg total) by mouth 2 (two) times daily.   clonazePAM (KLONOPIN) 0.5 MG tablet Take 0.5 mg by mouth See admin instructions. #2 at 8am, #1 at 2pm [facility])   clopidogrel (PLAVIX) 75 MG tablet Take 1 tablet (75 mg total) by mouth daily.   Continuous Glucose Receiver (FREESTYLE LIBRE 3 READER) DEVI Check blood sugars as directed   Continuous Glucose Sensor (FREESTYLE LIBRE 3 SENSOR) MISC Check blood sugars as directed   DULoxetine (CYMBALTA) 60 MG capsule Take 60 mg by mouth every morning.   EPIPEN 2-PAK 0.3 MG/0.3ML SOAJ injection Reported on 04/29/2016 (Patient not taking: Reported on 05/24/2023)   esomeprazole (NEXIUM) 40 MG capsule Take 40 mg by mouth every morning.    fexofenadine (ALLEGRA) 180 MG tablet Take 180 mg by mouth every morning. 8am [name])   fluticasone (VERAMYST) 27.5 MCG/SPRAY nasal spray Place 2 sprays into the nose daily as needed for rhinitis or allergies.    lamoTRIgine (LAMICTAL) 25 MG tablet Take 50 mg by mouth at bedtime.   Lancets (ONETOUCH ULTRASOFT) lancets Check blood sugars no  more than twice daily   losartan (COZAAR) 25 MG tablet Take 1 tablet (25 mg total) by mouth every evening.   montelukast (SINGULAIR) 10 MG tablet Take 10 mg by mouth at bedtime. [name]   Multiple Vitamin (MULTIVITAMIN) tablet Take 1 tablet by mouth daily.    nystatin (MYCOSTATIN/NYSTOP) powder Apply 1 application. topically 3 (three) times daily.   nystatin ointment (MYCOSTATIN) APPLY TOPICALLY TWICE A DAY AS NEEDED   ONETOUCH ULTRA test strip USE TO TEST GLUCOSE LEVELS 2 TIMES DAILY    pioglitazone-metformin (ACTOPLUS MET) 15-850 MG tablet Take 1 tablet by mouth 2 (two) times daily with a meal.   simvastatin (ZOCOR) 40 MG tablet Take 1 tablet (40 mg total) by mouth at bedtime.   solifenacin (VESICARE) 10 MG tablet Take 1 tablet (10 mg total) by mouth daily.   No facility-administered encounter medications on file as of 07/14/2023.    Objective:   PHYSICAL EXAMINATION:    VITALS:  There were no vitals filed for this visit.  GEN:  Normal appears male in no acute distress.  Appears stated age. HEENT:  Normocephalic, atraumatic. The mucous membranes are moist. The superficial temporal arteries are without ropiness or tenderness. Cardiovascular: Regular rate and rhythm. Lungs: Clear to auscultation bilaterally. Neck/Heme: There are no carotid bruits noted bilaterally.  NEUROLOGICAL: Orientation:  The patient is alert and oriented x 3.   Cranial nerves: There is good facial symmetry.  Extraocular muscles are intact and visual fields are full to confrontational testing. Speech is fluent and clear. Soft palate rises symmetrically and there is no tongue deviation. Hearing is intact to conversational tone. Tone: Tone is good throughout. Sensation: Sensation is intact to light touch and pinprick throughout (facial, trunk, extremities). Vibration is intact at the bilateral big toe. There is no extinction with double simultaneous stimulation. There is no sensory dermatomal level identified. Coordination:  The patient has no difficulty with RAM's or FNF bilaterally. Motor: Strength is 5/5 in the bilateral upper and lower extremities.  Shoulder shrug is equal and symmetric. There is no pronator drift.  There are no fasciculations noted. DTR's: Deep tendon reflexes are 2/4 at the bilateral biceps, triceps, brachioradialis, patella and achilles.  Plantar responses are downgoing bilaterally. Gait and Station: The patient is able to ambulate without difficulty. The patient is able to heel  toe walk without any difficulty. The patient is able to ambulate in a tandem fashion. The patient is able to stand in the Romberg position.    Total time spent on today's visit was ***greater than 60 minutes, including both face-to-face time and nonface-to-face time.  Time included that spent on review of records (prior notes available to me/labs/imaging if pertinent), discussing treatment and goals, answering patient's questions and coordinating care.   Cc:  Wanda Plump, MD

## 2023-07-13 DIAGNOSIS — J301 Allergic rhinitis due to pollen: Secondary | ICD-10-CM | POA: Diagnosis not present

## 2023-07-13 DIAGNOSIS — J3089 Other allergic rhinitis: Secondary | ICD-10-CM | POA: Diagnosis not present

## 2023-07-13 NOTE — Addendum Note (Signed)
Addended by: Willow Ora E on: 07/13/2023 12:57 PM   Modules accepted: Orders

## 2023-07-13 NOTE — Telephone Encounter (Signed)
I placed a referral to our pharmacists, please let him know

## 2023-07-14 ENCOUNTER — Encounter: Payer: Self-pay | Admitting: Neurology

## 2023-07-14 ENCOUNTER — Ambulatory Visit: Payer: Medicare Other | Admitting: Neurology

## 2023-07-14 VITALS — BP 136/72 | HR 73 | Wt 235.0 lb

## 2023-07-14 DIAGNOSIS — G2401 Drug induced subacute dyskinesia: Secondary | ICD-10-CM

## 2023-07-14 DIAGNOSIS — G2111 Neuroleptic induced parkinsonism: Secondary | ICD-10-CM

## 2023-07-14 DIAGNOSIS — H8101 Meniere's disease, right ear: Secondary | ICD-10-CM | POA: Diagnosis not present

## 2023-07-14 DIAGNOSIS — T43505A Adverse effect of unspecified antipsychotics and neuroleptics, initial encounter: Secondary | ICD-10-CM | POA: Diagnosis not present

## 2023-07-14 NOTE — Patient Instructions (Signed)
Keep me updated after your appointment with Dr. Donell Beers  The physicians and staff at [facility] are committed to providing excellent care. You may receive a survey requesting feedback about your experience at our office. We strive to receive "very good" responses to the survey questions. If you feel that your experience would prevent you from giving the office a "very good " response, please contact our office to try to remedy the situation. We may be reached at [phone]. Thank you for taking the time out of your busy day to complete the survey.

## 2023-07-15 DIAGNOSIS — E1142 Type 2 diabetes mellitus with diabetic polyneuropathy: Secondary | ICD-10-CM | POA: Diagnosis not present

## 2023-07-15 DIAGNOSIS — I10 Essential (primary) hypertension: Secondary | ICD-10-CM | POA: Diagnosis not present

## 2023-07-15 DIAGNOSIS — J4489 Other specified chronic obstructive pulmonary disease: Secondary | ICD-10-CM | POA: Diagnosis not present

## 2023-07-20 DIAGNOSIS — J301 Allergic rhinitis due to pollen: Secondary | ICD-10-CM | POA: Diagnosis not present

## 2023-07-20 DIAGNOSIS — J3089 Other allergic rhinitis: Secondary | ICD-10-CM | POA: Diagnosis not present

## 2023-07-20 DIAGNOSIS — J3081 Allergic rhinitis due to animal (cat) (dog) hair and dander: Secondary | ICD-10-CM | POA: Diagnosis not present

## 2023-07-22 ENCOUNTER — Encounter: Payer: Self-pay | Admitting: Neurology

## 2023-07-22 ENCOUNTER — Telehealth: Payer: Self-pay

## 2023-07-22 DIAGNOSIS — E1142 Type 2 diabetes mellitus with diabetic polyneuropathy: Secondary | ICD-10-CM | POA: Diagnosis not present

## 2023-07-22 DIAGNOSIS — I10 Essential (primary) hypertension: Secondary | ICD-10-CM | POA: Diagnosis not present

## 2023-07-22 DIAGNOSIS — J4489 Other specified chronic obstructive pulmonary disease: Secondary | ICD-10-CM | POA: Diagnosis not present

## 2023-07-22 MED ORDER — CARBIDOPA-LEVODOPA 25-100 MG PO TABS: 1.0000 | ORAL_TABLET | Freq: Three times a day (TID) | ORAL | 1 refills | Status: DC

## 2023-07-22 NOTE — Progress Notes (Signed)
   Care Guide Note  07/22/2023 Name: GUSTAVO HEENEY MRN: [id number] DOB: 1946/11/05  Referred by: Wanda Plump, MD Reason for referral : Care Coordination (Outreach to schedule with pharm d )   RAMELO CRUZHERNANDEZ is a 76 y.o. year old male who is a primary care patient of Drue Novel, Nolon Rod, MD. [name] was referred to the pharmacist for assistance related to DM.    An unsuccessful telephone outreach was attempted today to contact the patient who was referred to the pharmacy team for assistance with medication assistance. Additional attempts will be made to contact the patient.   Penne Lash, RMA Care Guide [facility]  [place], [place] [address] Direct Dial: [phone] [email or web address]

## 2023-07-22 NOTE — Telephone Encounter (Signed)
You can let pt know that unfortunately I can't explain the different dopamine receptors in a mychart message.   He can start carbidopa/levodopa as follows: Start Carbidopa Levodopa as follows: Take 1/2 tablet three times daily, at least 30 minutes before meals (approximately 7am/11am/4pm), for one week Then take 1/2 tablet in the morning, 1/2 tablet in the afternoon, 1 tablet in the evening, at least 30 minutes before meals, for one week Then take 1/2 tablet in the morning, 1 tablet in the afternoon, 1 tablet in the evening, at least 30 minutes before meals, for one week Then take 1 tablet three times daily at 7am/11am/4pm, at least 30 minutes before meals   As a reminder, carbidopa/levodopa can be taken at the same time as a carbohydrate, but we like to have you take your pill either 30 minutes before a protein source or 1 hour after as protein can interfere with carbidopa/levodopa absorption.  I [facility] him [facility].  Make an appt in the next 5 months

## 2023-07-22 NOTE — Progress Notes (Signed)
   Care Guide Note  07/22/2023 Name: Ryan Zhang MRN: [id number] DOB: 04/02/1946  Referred by: Wanda Plump, MD Reason for referral : Care Coordination (Outreach to schedule with pharm d )   Ryan Zhang is a 77 y.o. year old male who is a primary care patient of Drue Novel, Nolon Rod, MD. Ryan Zhang was referred to the pharmacist for assistance related to DM.    Successful contact was made with the patient to discuss pharmacy services including being ready for the pharmacist to call at least 5 minutes before the scheduled appointment time, to have medication bottles and any blood sugar or blood pressure readings ready for review. The patient agreed to meet with the pharmacist via with the pharmacist via telephone visit on (date/time).  08/08/2023  Ryan Zhang, RMA Care Guide [facility]  [place], [place] [address] Direct Dial: [phone] [email or web address]

## 2023-07-26 DIAGNOSIS — E1142 Type 2 diabetes mellitus with diabetic polyneuropathy: Secondary | ICD-10-CM | POA: Diagnosis not present

## 2023-07-26 DIAGNOSIS — J4489 Other specified chronic obstructive pulmonary disease: Secondary | ICD-10-CM | POA: Diagnosis not present

## 2023-07-26 DIAGNOSIS — I10 Essential (primary) hypertension: Secondary | ICD-10-CM | POA: Diagnosis not present

## 2023-07-27 DIAGNOSIS — J301 Allergic rhinitis due to pollen: Secondary | ICD-10-CM | POA: Diagnosis not present

## 2023-07-27 DIAGNOSIS — J3081 Allergic rhinitis due to animal (cat) (dog) hair and dander: Secondary | ICD-10-CM | POA: Diagnosis not present

## 2023-07-27 DIAGNOSIS — J3089 Other allergic rhinitis: Secondary | ICD-10-CM | POA: Diagnosis not present

## 2023-08-02 DIAGNOSIS — J3081 Allergic rhinitis due to animal (cat) (dog) hair and dander: Secondary | ICD-10-CM | POA: Diagnosis not present

## 2023-08-02 DIAGNOSIS — J301 Allergic rhinitis due to pollen: Secondary | ICD-10-CM | POA: Diagnosis not present

## 2023-08-02 DIAGNOSIS — J3089 Other allergic rhinitis: Secondary | ICD-10-CM | POA: Diagnosis not present

## 2023-08-05 DIAGNOSIS — E1142 Type 2 diabetes mellitus with diabetic polyneuropathy: Secondary | ICD-10-CM | POA: Diagnosis not present

## 2023-08-05 DIAGNOSIS — I10 Essential (primary) hypertension: Secondary | ICD-10-CM | POA: Diagnosis not present

## 2023-08-05 DIAGNOSIS — J4489 Other specified chronic obstructive pulmonary disease: Secondary | ICD-10-CM | POA: Diagnosis not present

## 2023-08-08 ENCOUNTER — Ambulatory Visit: Payer: Medicare Other | Admitting: Pharmacist

## 2023-08-08 ENCOUNTER — Telehealth: Payer: Self-pay | Admitting: Internal Medicine

## 2023-08-08 DIAGNOSIS — E1142 Type 2 diabetes mellitus with diabetic polyneuropathy: Secondary | ICD-10-CM

## 2023-08-08 DIAGNOSIS — R4189 Other symptoms and signs involving cognitive functions and awareness: Secondary | ICD-10-CM

## 2023-08-08 MED ORDER — PANTOPRAZOLE SODIUM 40 MG PO TBEC: 40.0000 mg | DELAYED_RELEASE_TABLET | Freq: Every day | ORAL | 1 refills | Status: DC

## 2023-08-08 NOTE — Telephone Encounter (Signed)
Received a note regarding interaction between Plavix and Nexium. Plan: Call patient, advised patient due to above,  will change to pantoprazole 40 mg 1 tablet daily.  Send a prescription.

## 2023-08-08 NOTE — Telephone Encounter (Signed)
Spoke w/ Pt- informed of recommendations. Pt verbalized understanding. Rx sent to [facility].

## 2023-08-10 DIAGNOSIS — J3089 Other allergic rhinitis: Secondary | ICD-10-CM | POA: Diagnosis not present

## 2023-08-10 DIAGNOSIS — J301 Allergic rhinitis due to pollen: Secondary | ICD-10-CM | POA: Diagnosis not present

## 2023-08-12 DIAGNOSIS — E1142 Type 2 diabetes mellitus with diabetic polyneuropathy: Secondary | ICD-10-CM | POA: Diagnosis not present

## 2023-08-12 DIAGNOSIS — I10 Essential (primary) hypertension: Secondary | ICD-10-CM | POA: Diagnosis not present

## 2023-08-12 DIAGNOSIS — J4489 Other specified chronic obstructive pulmonary disease: Secondary | ICD-10-CM | POA: Diagnosis not present

## 2023-08-15 DIAGNOSIS — J300 Vasomotor rhinitis: Secondary | ICD-10-CM | POA: Diagnosis not present

## 2023-08-15 DIAGNOSIS — E1142 Type 2 diabetes mellitus with diabetic polyneuropathy: Secondary | ICD-10-CM | POA: Diagnosis not present

## 2023-08-15 DIAGNOSIS — J4540 Moderate persistent asthma, uncomplicated: Secondary | ICD-10-CM | POA: Diagnosis not present

## 2023-08-15 DIAGNOSIS — J4489 Other specified chronic obstructive pulmonary disease: Secondary | ICD-10-CM | POA: Diagnosis not present

## 2023-08-15 DIAGNOSIS — R1310 Dysphagia, unspecified: Secondary | ICD-10-CM | POA: Diagnosis not present

## 2023-08-15 DIAGNOSIS — J3089 Other allergic rhinitis: Secondary | ICD-10-CM | POA: Diagnosis not present

## 2023-08-15 DIAGNOSIS — I10 Essential (primary) hypertension: Secondary | ICD-10-CM | POA: Diagnosis not present

## 2023-08-17 ENCOUNTER — Ambulatory Visit: Payer: Medicare Other | Admitting: Pharmacist

## 2023-08-17 ENCOUNTER — Other Ambulatory Visit: Payer: Self-pay | Admitting: Internal Medicine

## 2023-08-17 DIAGNOSIS — J3089 Other allergic rhinitis: Secondary | ICD-10-CM | POA: Diagnosis not present

## 2023-08-17 DIAGNOSIS — J301 Allergic rhinitis due to pollen: Secondary | ICD-10-CM | POA: Diagnosis not present

## 2023-08-17 DIAGNOSIS — J3081 Allergic rhinitis due to animal (cat) (dog) hair and dander: Secondary | ICD-10-CM | POA: Diagnosis not present

## 2023-08-17 DIAGNOSIS — E1142 Type 2 diabetes mellitus with diabetic polyneuropathy: Secondary | ICD-10-CM

## 2023-08-17 NOTE — Progress Notes (Signed)
08/08/2023 Name: Ryan Zhang MRN: [id number] DOB: 07-19-1946  No chief complaint on file.   Ryan Zhang is a 77 y.o. year old male who presented for a telephone visit.   They were referred to the pharmacist by their PCP for assistance in managing medication access and complex medication management.    Subjective:  Medication Access/Adherence  Current Pharmacy:  [facility] # [address], [place] - [address] [place] [address] Phone: [phone] Fax: [phone]  [id number] - [place], [place] - [address] [place] [address] Phone: [phone] Fax: [phone]   Patient reports affordability concerns with their medications: Yes  - was told that [facility] does not covered Continuous Glucose Monitor and quoted cost of > $100 per month.  Patient reports access/transportation concerns to their pharmacy: No  Patient reports adherence concerns with their medications:  Yes   Patient has cognitive and memory deficits that occurred after Electroconvulsive therapy (ECT) he received in the mid 2000's. Patient has been determined to have permanent disability  Diabetes: Diabetes related co morbidities: neuropathy, obesity, hyperlipidemia  Current medications:  Pioglitazone+metformin 15/850mg  twice a day.   Current glucose readings: none to report Using One Touch Ultra meter; Has tested up to 3 times daily in the past but recently has not been checking blood glucose regularly. Patient often forgets to check blood glucose. Memory deficit noted after Electroconvulsive therapy (ECT).  He also has difficulty with fingersticks with home blood glucose meter due pain related to diabetic neuropathy.  We submitted prior authorization for Freestyle Libre Continuous Glucose Monitor system but was denied by [facility] [facility] due to patient not  meeting requirements of using insulin therapy or having documented low blood glucose of < 55.     Patient denies hypoglycemic s/sx including no dizziness, shakiness, sweating but he is concerned that his blood glucose will fluctuate more since he started Sinemet.    Objective:  Lab Results  Component Value Date   HGBA1C 6.6 (H) 05/24/2023    Lab Results  Component Value Date   CREATININE 1.01 05/24/2023   BUN 33 (H) 05/24/2023   NA 141 05/24/2023   K 5.0 05/24/2023   CL 103 05/24/2023   CO2 26 05/24/2023    Lab Results  Component Value Date   CHOL 135 12/21/2022   HDL 52.30 12/21/2022   LDLCALC 66 12/21/2022   TRIG 83.0 12/21/2022   CHOLHDL 3 12/21/2022    Medications Reviewed Today     Reviewed by Henrene Pastor, RPH-CPP (Pharmacist) on 08/17/23 at 0950  Med List Status: <None>   Medication Order Taking? Sig Documenting Provider Last Dose Status Informant  albuterol (PROVENTIL) (2.5 MG/3ML) 0.083% nebulizer solution [id number]  Take 3 mLs (2.5 mg total) by nebulization every 6 (six) hours as needed for wheezing or shortness of breath. Wanda Plump, MD  Active   albuterol (VENTOLIN HFA) 108 [id number] Base) MCG/ACT inhaler [id number]  Inhale 2 puffs into the lungs every 6 (six) hours as needed for wheezing or shortness of breath. Wanda Plump, MD  Active   amoxicillin (AMOXIL) 500 MG capsule [id number]  TAKE 4 CAPSULES BY MOUTH 1 HOUR PRIOR TO DENTAL PROCEDURE [provider]  Active   asenapine (SAPHRIS) 5 MG SUBL 24 hr tablet [id number]  Place 5 mg under the tongue at bedtime. [provider]  Active   augmented betamethasone dipropionate (DIPROLENE-AF) 0.05 % cream [id number]  Apply topically 2 (two) times daily. Wanda Plump, MD  Active            Med Note (CANTER, Vicente Males D   Tue Dec 21, 2022 10:04 AM) PRN  budesonide-formoterol [facility]) 160-4.5 MCG/ACT inhaler [id number]  Inhale 2 puffs into the lungs 2 (two) times daily.  [provider]  Active Self   buPROPion (WELLBUTRIN XL) 150 MG 24 hr tablet [id number]  Take 150 mg by mouth daily. [provider]  Active   carbidopa-levodopa (SINEMET IR) 25-100 MG tablet [id number]  Take 1 tablet by mouth 3 (three) times daily. 7am/11am/4pm Tat, Octaviano Batty, DO  Active   cholecalciferol (VITAMIN D3) 25 MCG (1000 UNIT) tablet [id number]  Take 1,000 Units by mouth daily. [provider]  Active   clonazePAM (KLONOPIN) 0.5 MG tablet [id number]  Take 0.5 mg by mouth daily. [provider]  Active Self           Med Note (CANTER, Raynaldo Opitz Jul 31, 2018 11:21 AM)    clopidogrel (PLAVIX) 75 MG tablet [id number]  Take 1 tablet (75 mg total) by mouth daily. Wanda Plump, MD  Active   Continuous Glucose Receiver (FREESTYLE LIBRE 3 READER) [place] [id number]  Check blood sugars as directed  Patient not taking: Reported on 08/08/2023   Wanda Plump, MD  Active   Continuous Glucose Sensor (FREESTYLE LIBRE 3 [place]) [place] [id number]  Check blood sugars as directed  Patient not taking: Reported on 08/08/2023   Wanda Plump, MD  Active   DULoxetine (CYMBALTA) 60 MG capsule [id number]  Take 60 mg by mouth every morning. [provider]  Active Self           Med Note Katrinka Blazing   Tue Feb 26, 2020  2:55 PM)    EPIPEN 2-PAK 0.3 MG/0.3ML SOAJ injection [id number]  Reported on 04/29/2016 [provider]  Active            Med Note (CANTER, Raynaldo Opitz Jan 01, 2019  9:31 AM) PRN  fexofenadine (ALLEGRA) 180 MG tablet [id number]  Take 180 mg by mouth every morning. 8am [name]) [provider]  Active Self  fluticasone Aleda Grana) 50 MCG/ACT nasal spray [id number]   [provider]  Active   lamoTRIgine (LAMICTAL) 25 MG tablet [id number]  Take 50 mg by mouth at bedtime. [provider]  Active Self  Lancets Letta Pate ULTRASOFT) lancets [id number]  Check blood sugars no more than twice daily Wanda Plump, MD  Active Self  losartan (COZAAR) 25 MG tablet [id number]  Take 1  tablet (25 mg total) by mouth every evening. Wanda Plump, MD  Active   montelukast (SINGULAIR) 10 MG tablet [id number]  Take 10 mg by mouth at bedtime. [name] [provider]  Active   Multiple Vitamin (MULTIVITAMIN) tablet [id number]  Take 1 tablet by mouth daily. Lawyer, Historical, MD  Active Self           Med Note (CANTER, [facility] D   Mon Aug 28, 2018  1:40 PM)    naproxen (NAPROSYN) 500 MG tablet [id number]  Take 500 mg by mouth daily as needed. [provider]  Active   nystatin (MYCOSTATIN/NYSTOP) powder [id number]  Apply 1 application. topically 3 (three) times daily. [provider]  Active  Med Note Valentino Nose   Tue Dec 21, 2022 10:04 AM) PRN  nystatin ointment Ambrose Pancoast) [id number]  APPLY TOPICALLY TWICE A DAY AS NEEDED Wanda Plump, MD  Active            Med Note [place], Vicente Males D   Tue Dec 21, 2022 10:04 AM) PRN  Koren Bound test strip [id number]  USE TO TEST GLUCOSE LEVELS 2 TIMES DAILY Wanda Plump, MD  Active   pantoprazole (PROTONIX) 40 MG tablet [id number] Yes Take 40 mg by mouth daily. [provider]  Active   pioglitazone-metformin (ACTOPLUS MET) 15-850 MG tablet [id number]  Take 1 tablet by mouth 2 (two) times daily with a meal. Wanda Plump, MD  Active   simvastatin (ZOCOR) 40 MG tablet [id number]  Take 1 tablet (40 mg total) by mouth at bedtime. Wanda Plump, MD  Active   solifenacin (VESICARE) 10 MG tablet [id number]  Take 1 tablet (10 mg total) by mouth daily. Wanda Plump, MD  Active               Assessment/Plan:   Diabetes: patient is having difficulty managing diabetes with home blood glucose monitoring due to neuropathy and memory deficits caused by past ECT  - Recommend to continue Pioglitazone+metformin 15/850mg  twice a day.  - Patient would benefit from Continuous Glucose Monitor due to memory deficits and forgetting to monitor blood glucose at home with fingerstick method. Additionally  Continuous Glucose Monitor could also benefit patient in regards to improved knowledge about diet impacts on blood glucose / diabetes. However prior authorization was denied by his current insurance.  Patient could use manufacturer coupon to get 2 Libre sensors for about $75 however he states this is currently too much. Once over-the-counter CMG is available the cost might be lower but his is not an option at this time.   Medication Management:  Updated med list - esomeprazole was changed to pantoprazole recently.  Reviewed refill history and discussed adherence. Due to refill simvastatin and losartan - updated Rx's were sent in earlier today by CMA.    Follow Up Plan: 2 to 4 weeks  Henrene Pastor, PharmD Clinical Pharmacist [facility]

## 2023-08-18 DIAGNOSIS — E1142 Type 2 diabetes mellitus with diabetic polyneuropathy: Secondary | ICD-10-CM | POA: Diagnosis not present

## 2023-08-18 DIAGNOSIS — I10 Essential (primary) hypertension: Secondary | ICD-10-CM | POA: Diagnosis not present

## 2023-08-18 DIAGNOSIS — J4489 Other specified chronic obstructive pulmonary disease: Secondary | ICD-10-CM | POA: Diagnosis not present

## 2023-08-22 ENCOUNTER — Telehealth: Payer: Self-pay

## 2023-08-22 DIAGNOSIS — Z6834 Body mass index (BMI) 34.0-34.9, adult: Secondary | ICD-10-CM

## 2023-08-22 DIAGNOSIS — Z7902 Long term (current) use of antithrombotics/antiplatelets: Secondary | ICD-10-CM | POA: Diagnosis not present

## 2023-08-22 DIAGNOSIS — F419 Anxiety disorder, unspecified: Secondary | ICD-10-CM | POA: Diagnosis not present

## 2023-08-22 DIAGNOSIS — D509 Iron deficiency anemia, unspecified: Secondary | ICD-10-CM | POA: Diagnosis not present

## 2023-08-22 DIAGNOSIS — Z9181 History of falling: Secondary | ICD-10-CM | POA: Diagnosis not present

## 2023-08-22 DIAGNOSIS — J4489 Other specified chronic obstructive pulmonary disease: Secondary | ICD-10-CM | POA: Diagnosis not present

## 2023-08-22 DIAGNOSIS — I10 Essential (primary) hypertension: Secondary | ICD-10-CM | POA: Diagnosis not present

## 2023-08-22 DIAGNOSIS — F319 Bipolar disorder, unspecified: Secondary | ICD-10-CM | POA: Diagnosis not present

## 2023-08-22 DIAGNOSIS — E1142 Type 2 diabetes mellitus with diabetic polyneuropathy: Secondary | ICD-10-CM | POA: Diagnosis not present

## 2023-08-22 NOTE — Telephone Encounter (Signed)
Plan of care ([id number]) signed and faxed back to [facility] at [phone]. Form sent for scanning.

## 2023-08-24 ENCOUNTER — Telehealth: Payer: Self-pay | Admitting: Internal Medicine

## 2023-08-24 DIAGNOSIS — R269 Unspecified abnormalities of gait and mobility: Secondary | ICD-10-CM

## 2023-08-24 DIAGNOSIS — J3089 Other allergic rhinitis: Secondary | ICD-10-CM | POA: Diagnosis not present

## 2023-08-24 DIAGNOSIS — R296 Repeated falls: Secondary | ICD-10-CM

## 2023-08-24 DIAGNOSIS — J3081 Allergic rhinitis due to animal (cat) (dog) hair and dander: Secondary | ICD-10-CM | POA: Diagnosis not present

## 2023-08-24 DIAGNOSIS — J301 Allergic rhinitis due to pollen: Secondary | ICD-10-CM | POA: Diagnosis not present

## 2023-08-24 NOTE — Telephone Encounter (Signed)
Please advise 

## 2023-08-24 NOTE — Addendum Note (Signed)
Addended byConrad [place] D on: 08/24/2023 02:55 PM   Modules accepted: Orders

## 2023-08-24 NOTE — Telephone Encounter (Signed)
Natalia [facility]wood [facility]) called stating they will be discharging the pt from [facility] and the pt wants to go back to PT with [facility] and needs an order for this to be done. Pt is set to be discharged  next week.

## 2023-08-24 NOTE — Telephone Encounter (Signed)
Please place the order

## 2023-08-24 NOTE — Telephone Encounter (Signed)
Referral placed.

## 2023-08-25 ENCOUNTER — Telehealth: Payer: Self-pay | Admitting: Internal Medicine

## 2023-08-25 DIAGNOSIS — I10 Essential (primary) hypertension: Secondary | ICD-10-CM | POA: Diagnosis not present

## 2023-08-25 DIAGNOSIS — E1142 Type 2 diabetes mellitus with diabetic polyneuropathy: Secondary | ICD-10-CM | POA: Diagnosis not present

## 2023-08-25 DIAGNOSIS — J4489 Other specified chronic obstructive pulmonary disease: Secondary | ICD-10-CM | POA: Diagnosis not present

## 2023-08-25 NOTE — Telephone Encounter (Signed)
FYI

## 2023-08-25 NOTE — Telephone Encounter (Signed)
Amber from [facility] called to let PCP know that they will no longer be accepting referrals for this patient at their facility as they believe he is not appropriate for outpatient physical therapy.   She explained that the therapist at their facility is not comfortable seeing Ryan Zhang anymore as she mentioned that he is "always too tired to move and participate with therapy and constantly falls".   Furthermore, they suggest the patient be referred for home physical therapy. Please advise.

## 2023-08-31 DIAGNOSIS — J3081 Allergic rhinitis due to animal (cat) (dog) hair and dander: Secondary | ICD-10-CM | POA: Diagnosis not present

## 2023-08-31 DIAGNOSIS — J301 Allergic rhinitis due to pollen: Secondary | ICD-10-CM | POA: Diagnosis not present

## 2023-08-31 DIAGNOSIS — J3089 Other allergic rhinitis: Secondary | ICD-10-CM | POA: Diagnosis not present

## 2023-09-02 DIAGNOSIS — J4489 Other specified chronic obstructive pulmonary disease: Secondary | ICD-10-CM | POA: Diagnosis not present

## 2023-09-02 DIAGNOSIS — I10 Essential (primary) hypertension: Secondary | ICD-10-CM | POA: Diagnosis not present

## 2023-09-02 DIAGNOSIS — E1142 Type 2 diabetes mellitus with diabetic polyneuropathy: Secondary | ICD-10-CM | POA: Diagnosis not present

## 2023-09-07 DIAGNOSIS — J3089 Other allergic rhinitis: Secondary | ICD-10-CM | POA: Diagnosis not present

## 2023-09-07 DIAGNOSIS — J301 Allergic rhinitis due to pollen: Secondary | ICD-10-CM | POA: Diagnosis not present

## 2023-09-08 DIAGNOSIS — H35033 Hypertensive retinopathy, bilateral: Secondary | ICD-10-CM | POA: Diagnosis not present

## 2023-09-08 DIAGNOSIS — H35373 Puckering of macula, bilateral: Secondary | ICD-10-CM | POA: Diagnosis not present

## 2023-09-08 DIAGNOSIS — E119 Type 2 diabetes mellitus without complications: Secondary | ICD-10-CM | POA: Diagnosis not present

## 2023-09-08 DIAGNOSIS — Z961 Presence of intraocular lens: Secondary | ICD-10-CM | POA: Diagnosis not present

## 2023-09-08 LAB — HM DIABETES EYE EXAM

## 2023-09-09 ENCOUNTER — Encounter: Payer: Self-pay | Admitting: Internal Medicine

## 2023-09-09 ENCOUNTER — Ambulatory Visit (INDEPENDENT_AMBULATORY_CARE_PROVIDER_SITE_OTHER): Payer: Medicare Other | Admitting: Internal Medicine

## 2023-09-09 VITALS — BP 136/66 | HR 86 | Temp 98.1°F | Resp 18 | Ht 70.0 in | Wt 239.5 lb

## 2023-09-09 DIAGNOSIS — G2111 Neuroleptic induced parkinsonism: Secondary | ICD-10-CM

## 2023-09-09 DIAGNOSIS — D649 Anemia, unspecified: Secondary | ICD-10-CM | POA: Diagnosis not present

## 2023-09-09 DIAGNOSIS — I10 Essential (primary) hypertension: Secondary | ICD-10-CM | POA: Diagnosis not present

## 2023-09-09 DIAGNOSIS — T43505A Adverse effect of unspecified antipsychotics and neuroleptics, initial encounter: Secondary | ICD-10-CM | POA: Diagnosis not present

## 2023-09-09 DIAGNOSIS — F3178 Bipolar disorder, in full remission, most recent episode mixed: Secondary | ICD-10-CM | POA: Diagnosis not present

## 2023-09-09 DIAGNOSIS — E1142 Type 2 diabetes mellitus with diabetic polyneuropathy: Secondary | ICD-10-CM | POA: Diagnosis not present

## 2023-09-09 NOTE — Assessment & Plan Note (Signed)
DM: On Actos plus met, recommend ambulatory CBGs, check A1c. HTN: BP looks very good, recommend ambulatory BPs, continue losartan, last BMP very good. Bipolar, depression: Managed elsewhere, on multiple meds. Question of stroke: On Plavix, statins. Carotid artery disease: Per ultrasound 2022.  Consider [place] next year. Secondary parkinsonism, tardive dyskinesia,: Saw neuro August 2024, started Sinemet, it made a significant difference, has improved his gait to the point that essentially is having no falls. Frequent falls, gait disorder: See above, finished PT, would like to get some more but has been unable to arrange. Dizziness: Mild problem, observation. Anemia: Mild, chronic, check a CBC.  No GI symptoms. Preventive care: Had a COVID and flu shot recently, had RSV RTC 4 months CPX

## 2023-09-09 NOTE — Patient Instructions (Addendum)
  Check the  blood pressure regularly Blood pressure goal:  between 110/65 and  135/85. If it is consistently higher or lower, let me know   Diabetes: You can check your sugars at different times, they right times to do it are: - early in AM fasting  ( blood sugar goal 70-130) - 2 hours after a meal (blood sugar goal less than 180)     GO TO THE LAB : Get the blood work     Next visit with me in 4 months for a physical exam    Please schedule it at the front desk

## 2023-09-09 NOTE — Progress Notes (Signed)
Subjective:    Patient ID: Ryan Zhang, male    DOB: Sep 01, 1946, 77 y.o.   MRN: [id number]  DOS:  09/09/2023 Type of visit - description: f/u . Here w/ his wife   Since the last office visit, saw neurology, meds were adjusted, he feels very good. No further major falls. Good med compliance.   Review of Systems See above   Past Medical History:  Diagnosis Date   Allergy    allergy shots Dr. Corinda Gubler   Anemia    Anxiety    Asthma    moderate persistant   Bipolar affective (HCC)    Cataract    both eyes   Clotting disorder (HCC)    Due to blood thinner   COPD (chronic obstructive pulmonary disease) (HCC)    Depression    Diabetes mellitus    DJD (degenerative joint disease)    Dysphagia    Gallstones    hx of, s/p cholecystectomy   GERD (gastroesophageal reflux disease)    Hepatitis A    as teenager 60's   Hollenhorst plaque    right eye   Hyperlipidemia    Hypertension    Kidney stones    hx of   Meniere's disease    Motility disorder, esophageal    Obesity    Pancreatitis    Personal history of colonic polyps 11/2004   hyperplastic.   Stroke [facility])    per MRI    Past Surgical History:  Procedure Laterality Date   CATARACT EXTRACTION Bilateral 09/2015 and 10/2015   CHOLECYSTECTOMY     COLONOSCOPY     EYE SURGERY     JOINT REPLACEMENT     TOTAL KNEE ARTHROPLASTY Bilateral    both knees    Current Outpatient Medications  Medication Instructions   albuterol (PROVENTIL) 2.5 mg, Nebulization, Every 6 hours PRN   albuterol (VENTOLIN HFA) 108 (90 Base) MCG/ACT inhaler 2 puffs, Inhalation, Every 6 hours PRN   amoxicillin (AMOXIL) 500 MG capsule TAKE 4 CAPSULES BY MOUTH 1 HOUR PRIOR TO DENTAL PROCEDURE   asenapine (SAPHRIS) 5 mg, Sublingual, Daily at bedtime   augmented betamethasone dipropionate (DIPROLENE-AF) 0.05 % cream Topical, 2 times daily   budesonide-formoterol (SYMBICORT) 160-4.5 MCG/ACT inhaler 2 puffs, Inhalation, 2 times daily   buPROPion  (WELLBUTRIN XL) 150 mg, Oral, Daily   carbidopa-levodopa (SINEMET IR) 25-100 MG tablet 1 tablet, Oral, 3 times daily, 7am/11am/4pm   cholecalciferol (VITAMIN D3) 1,000 Units, Oral, Daily   clonazePAM (KLONOPIN) 0.5 mg, Oral, Daily   clopidogrel (PLAVIX) 75 mg, Oral, Daily   DULoxetine (CYMBALTA) 60 mg, Oral, BH-each morning   EPIPEN 2-PAK 0.3 MG/0.3ML SOAJ injection Reported on 04/29/2016   fexofenadine (ALLEGRA) 180 mg, Oral, Every morning, 8am [name])   fluticasone (FLONASE) 50 MCG/ACT nasal spray    lamoTRIgine (LAMICTAL) 50 mg, Oral, Daily at bedtime   Lancets (ONETOUCH ULTRASOFT) lancets Check blood sugars no more than twice daily   losartan (COZAAR) 25 mg, Oral, Every evening   montelukast (SINGULAIR) 10 mg, Oral, Daily at bedtime, [name]    Multiple Vitamin (MULTIVITAMIN) tablet 1 tablet, Oral, Daily, [employer] senior   naproxen (NAPROSYN) 500 mg, Oral, Daily PRN   nystatin (MYCOSTATIN/NYSTOP) powder 1 application , Topical, 3 times daily   nystatin ointment (MYCOSTATIN) APPLY TOPICALLY TWICE A DAY AS NEEDED   ONETOUCH ULTRA test strip USE TO TEST GLUCOSE LEVELS 2 TIMES DAILY   pantoprazole (PROTONIX) 40 mg, Oral, Daily   pioglitazone-metformin (ACTOPLUS MET) 15-850  MG tablet 1 tablet, Oral, 2 times daily with meals   simvastatin (ZOCOR) 40 mg, Oral, Daily at bedtime   solifenacin (VESICARE) 10 mg, Oral, Daily       Objective:   Physical Exam BP 136/66   Pulse 86   Temp 98.1 F (36.7 C) (Oral)   Resp 18   Ht 5\' 10"  (1.778 m)   Wt 239 lb 8 oz (108.6 kg)   SpO2 98%   BMI 34.36 kg/m  General:   Well developed, NAD, BMI noted. HEENT:  Normocephalic . Face symmetric, atraumatic Lungs:  CTA B Normal respiratory effort, no intercostal retractions, no accessory muscle use. Heart: RRR,  no murmur.  Lower extremities: no pretibial edema bilaterally  Skin: Not pale. Not jaundice Neurologic:  alert & oriented X3.  Speech normal, gait assisted by a standup walker   psych--  Cognition and judgment appear intact.  Cooperative with normal attention span and concentration.  Behavior appropriate. No anxious or depressed appearing.      Assessment    Assessment DM, mild neuropathy HTN Hyperlipidemia Morbid obesity (BMI 39 plus DM) Bipolar, Depression, cognitive issues after ECT 2007 (on disability) per   psychiatry, Dr Donell Beers DJD-- hydrocodone rarely rx by [place] ortho Asthma-Allergies ------------ Dr [name]  GERD, h/o dysphagia (chronic, neurogenic-transfer dysphagia? See GI note 12-2011) Neuro: --? stroke : saw neuro 2013 d/t B transient visual loss, ASA changed to plavix.  ---Saw neuro 11-2013: fall, syncope,parkinsonism d/t sapharis? CV: Enlarged ascending Ao , + Ao atherosclerosis  --per CT 12-2015,CT chest 08-2016 stable.    --MRI chest 10/2017 stable, no routine f/u Nl LE arterial dopplers 2015 Carotid artery disease: Per [place]  2011, last [place] 2018: <50% B,   08/2019 < 1-39% B, 09-2021 < 39%, stable consider recheck in 2 years  CT chest  ---RML  8mm: 07-2014, CT 12-2015, CT 08-2016, MRI chest 12,2018, CT chest 07/2018:Stable  HOH: L deaf L, R  hearing aid H/o Mnire's disease  Iron deficiency anemia: Noted 01/2018, + Hemoccult, 09-2018: Colonoscopy and EGD were completely normal  Urology- on Vesicare, sees urology  PLAN: DM: On Actos plus met, recommend ambulatory CBGs, check A1c. HTN: BP looks very good, recommend ambulatory BPs, continue losartan, last BMP very good. Bipolar, depression: Managed elsewhere, on multiple meds. Question of stroke: On Plavix, statins. Carotid artery disease: Per ultrasound 2022.  Consider [place] next year. Secondary parkinsonism, tardive dyskinesia,: Saw neuro August 2024, started Sinemet, it made a significant difference, has improved his gait to the point that essentially is having no falls. Frequent falls, gait disorder: See above, finished PT, would like to get some more but has been unable to  arrange. Dizziness: Mild problem, observation. Anemia: Mild, chronic, check a CBC.  No GI symptoms. Preventive care: Had a COVID and flu shot recently, had RSV RTC 4 months CPX

## 2023-09-10 LAB — CBC WITH DIFFERENTIAL/PLATELET
Absolute Lymphocytes: 2075 {cells}/uL (ref 850–3900)
Absolute Monocytes: 596 {cells}/uL (ref 200–950)
Basophils Absolute: 59 {cells}/uL (ref 0–200)
Basophils Relative: 0.7 %
Eosinophils Absolute: 252 {cells}/uL (ref 15–500)
Eosinophils Relative: 3 %
HCT: 36.4 % — ABNORMAL LOW (ref 38.5–50.0)
Hemoglobin: 11.2 g/dL — ABNORMAL LOW (ref 13.2–17.1)
MCH: 24.9 pg — ABNORMAL LOW (ref 27.0–33.0)
MCHC: 30.8 g/dL — ABNORMAL LOW (ref 32.0–36.0)
MCV: 81.1 fL (ref 80.0–100.0)
MPV: 10.2 fL (ref 7.5–12.5)
Monocytes Relative: 7.1 %
Neutro Abs: 5418 {cells}/uL (ref 1500–7800)
Neutrophils Relative %: 64.5 %
Platelets: 352 10*3/uL (ref 140–400)
RBC: 4.49 10*6/uL (ref 4.20–5.80)
RDW: 14.8 % (ref 11.0–15.0)
Total Lymphocyte: 24.7 %
WBC: 8.4 10*3/uL (ref 3.8–10.8)

## 2023-09-10 LAB — HEMOGLOBIN A1C
Hgb A1c MFr Bld: 7 %{Hb} — ABNORMAL HIGH (ref <5.7)
Mean Plasma Glucose: 154 mg/dL
eAG (mmol/L): 8.5 mmol/L

## 2023-09-14 DIAGNOSIS — J301 Allergic rhinitis due to pollen: Secondary | ICD-10-CM | POA: Diagnosis not present

## 2023-09-14 DIAGNOSIS — J3089 Other allergic rhinitis: Secondary | ICD-10-CM | POA: Diagnosis not present

## 2023-09-14 DIAGNOSIS — J3081 Allergic rhinitis due to animal (cat) (dog) hair and dander: Secondary | ICD-10-CM | POA: Diagnosis not present

## 2023-09-21 DIAGNOSIS — J3089 Other allergic rhinitis: Secondary | ICD-10-CM | POA: Diagnosis not present

## 2023-09-21 DIAGNOSIS — J301 Allergic rhinitis due to pollen: Secondary | ICD-10-CM | POA: Diagnosis not present

## 2023-09-28 DIAGNOSIS — J301 Allergic rhinitis due to pollen: Secondary | ICD-10-CM | POA: Diagnosis not present

## 2023-09-28 DIAGNOSIS — J3089 Other allergic rhinitis: Secondary | ICD-10-CM | POA: Diagnosis not present

## 2023-10-05 DIAGNOSIS — J301 Allergic rhinitis due to pollen: Secondary | ICD-10-CM | POA: Diagnosis not present

## 2023-10-05 DIAGNOSIS — J3081 Allergic rhinitis due to animal (cat) (dog) hair and dander: Secondary | ICD-10-CM | POA: Diagnosis not present

## 2023-10-05 DIAGNOSIS — J3089 Other allergic rhinitis: Secondary | ICD-10-CM | POA: Diagnosis not present

## 2023-10-06 ENCOUNTER — Ambulatory Visit
Admission: RE | Admit: 2023-10-06 | Discharge: 2023-10-06 | Disposition: A | Discharge status: Home / Self Care | Payer: Medicare Other | Source: Ambulatory Visit | Attending: Emergency Medicine | Admitting: Emergency Medicine

## 2023-10-06 VITALS — BP 142/81 | HR 75 | Temp 97.4°F | Resp 17

## 2023-10-06 DIAGNOSIS — R21 Rash and other nonspecific skin eruption: Secondary | ICD-10-CM

## 2023-10-06 MED ORDER — NYSTATIN 100000 UNIT/GM EX OINT: 1.0000 | TOPICAL_OINTMENT | Freq: Two times a day (BID) | CUTANEOUS | 1 refills | Status: DC

## 2023-10-06 NOTE — ED Provider Notes (Signed)
[facility]    CSN: [id number] Arrival date & time: 10/06/23  1847     History   Chief Complaint Chief Complaint  Patient presents with   Rash    I have something  going on in my groin. I have a sharp pain there, and desitin doesn't cut it - Entered by patient    HPI Ryan Zhang is a 77 y.o. male.  Groin and genital rash for about a week Reporting itching and discomfort Has tried Desitin without relief History of yeast infections similar to this, usually does well with nystatin Not having dysuria, urgency, frequency No fevers  Has seen urology in the past Has urethral stricture, chronic phimosis, and balanitis xerotica obliterans (BXO) Has to use a funnel when he voids   Past Medical History:  Diagnosis Date   Allergy    allergy shots Dr. Corinda Gubler   Anemia    Anxiety    Asthma    moderate persistant   Bipolar affective (HCC)    Cataract    both eyes   Clotting disorder (HCC)    Due to blood thinner   COPD (chronic obstructive pulmonary disease) (HCC)    Depression    Diabetes mellitus    DJD (degenerative joint disease)    Dysphagia    Gallstones    hx of, s/p cholecystectomy   GERD (gastroesophageal reflux disease)    Hepatitis A    as teenager 60's   Hollenhorst plaque    right eye   Hyperlipidemia    Hypertension    Kidney stones    hx of   Meniere's disease    Motility disorder, esophageal    Obesity    Pancreatitis    Personal history of colonic polyps 11/2004   hyperplastic.   Stroke [facility])    per MRI    Patient Active Problem List   Diagnosis Date Noted   Tinea cruris 07/24/2020   PCP NOTES >>>>>>>>>>>>>>>>>>>>>>>>>>>>>>> 01/12/2016   Folliculitis 05/09/2015   Hearing loss in right ear 08/05/2014   Panic attack as reaction to stress 04/20/2012   Dysphagia, neurologic 01/07/2012   Morbid obesity (HCC) 01/07/2012   Chest pain, musculoskeletal 12/07/2011   Candidiasis of skin 08/12/2011   Annual physical exam 06/07/2011    OLECRANON BURSITIS 01/29/2011   PARTIAL ARTERIAL OCCLUSION OF RETINA 05/27/2010   Essential hypertension, benign 01/16/2010   ABNORMAL ELECTROCARDIOGRAM 01/14/2010   Asthma 10/14/2009   GERD 10/14/2009   Bipolar disorder (HCC) 02/20/2009   PHIMOSIS 02/20/2009   KNEE PAIN, CHRONIC 11/20/2008   HIP PAIN, RIGHT 08/19/2008   DM type 2 with diabetic peripheral neuropathy (HCC) 02/22/2007   Hyperlipidemia associated with type 2 diabetes mellitus (HCC) 02/22/2007   ALLERGIC RHINITIS, SEASONAL 02/22/2007   LOW BACK PAIN, CHRONIC 02/22/2007   MENIERE'S DISEASE, HX OF 02/22/2007    Past Surgical History:  Procedure Laterality Date   CATARACT EXTRACTION Bilateral 09/2015 and 10/2015   CHOLECYSTECTOMY     COLONOSCOPY     EYE SURGERY     JOINT REPLACEMENT     TOTAL KNEE ARTHROPLASTY Bilateral    both knees       Home Medications    Prior to Admission medications   Medication Sig Start Date End Date Taking? Authorizing Provider  nystatin ointment (MYCOSTATIN) Apply 1 Application topically 2 (two) times daily. 10/06/23  Yes Laquonda Welby, Lurena Joiner, PA-C  albuterol (PROVENTIL) (2.5 MG/3ML) 0.083% nebulizer solution Take 3 mLs (2.5 mg total) by nebulization every 6 (six) hours  as needed for wheezing or shortness of breath. 12/28/21   Wanda Plump, MD  albuterol (VENTOLIN HFA) 108 (90 Base) MCG/ACT inhaler Inhale 2 puffs into the lungs every 6 (six) hours as needed for wheezing or shortness of breath. 02/11/20   Wanda Plump, MD  amoxicillin (AMOXIL) 500 MG capsule TAKE 4 CAPSULES BY MOUTH 1 HOUR PRIOR TO DENTAL PROCEDURE Patient not taking: Reported on 09/09/2023 09/12/18   [provider]  asenapine (SAPHRIS) 5 MG SUBL 24 hr tablet Place 5 mg under the tongue at bedtime.    [provider]  augmented betamethasone dipropionate (DIPROLENE-AF) 0.05 % cream Apply topically 2 (two) times daily. 04/19/22   Wanda Plump, MD  budesonide-formoterol [facility]) 160-4.5 MCG/ACT inhaler Inhale 2  puffs into the lungs 2 (two) times daily.     [provider]  buPROPion (WELLBUTRIN XL) 150 MG 24 hr tablet Take 150 mg by mouth daily. 02/15/21   [provider]  carbidopa-levodopa (SINEMET IR) 25-100 MG tablet Take 1 tablet by mouth 3 (three) times daily. 7am/11am/4pm 07/22/23   Tat, Octaviano Batty, DO  cholecalciferol (VITAMIN D3) 25 MCG (1000 UNIT) tablet Take 1,000 Units by mouth daily.    [provider]  clonazePAM (KLONOPIN) 0.5 MG tablet Take 0.5 mg by mouth daily.    [provider]  clopidogrel (PLAVIX) 75 MG tablet Take 1 tablet (75 mg total) by mouth daily. 05/30/23   Wanda Plump, MD  DULoxetine (CYMBALTA) 60 MG capsule Take 60 mg by mouth every morning.    [provider]  EPIPEN 2-PAK 0.3 MG/0.3ML SOAJ injection Reported on 04/29/2016 Patient not taking: Reported on 09/09/2023 07/04/14   [provider]  fexofenadine (ALLEGRA) 180 MG tablet Take 180 mg by mouth every morning. 8am [name])    [provider]  fluticasone Aleda Grana) 50 MCG/ACT nasal spray  09/21/22   [provider]  lamoTRIgine (LAMICTAL) 25 MG tablet Take 50 mg by mouth at bedtime.    [provider]  Lancets Letta Pate ULTRASOFT) lancets Check blood sugars no more than twice daily 12/09/16   Wanda Plump, MD  losartan (COZAAR) 25 MG tablet Take 1 tablet (25 mg total) by mouth every evening. 08/17/23   Wanda Plump, MD  montelukast (SINGULAIR) 10 MG tablet Take 10 mg by mouth at bedtime. [name]    [provider]  Multiple Vitamin (MULTIVITAMIN) tablet Take 1 tablet by mouth daily. Herbalist, Historical, MD  naproxen (NAPROSYN) 500 MG tablet Take 500 mg by mouth daily as needed.    [provider]  [facility] ULTRA test strip USE TO TEST GLUCOSE LEVELS 2 TIMES DAILY 06/29/23   Wanda Plump, MD  pantoprazole (PROTONIX) 40 MG tablet Take 40 mg by mouth daily. 08/12/23   [provider]   pioglitazone-metformin (ACTOPLUS MET) 15-850 MG tablet Take 1 tablet by mouth 2 (two) times daily with a meal. 06/06/23   Wanda Plump, MD  simvastatin (ZOCOR) 40 MG tablet Take 1 tablet (40 mg total) by mouth at bedtime. 08/17/23   Wanda Plump, MD  solifenacin (VESICARE) 10 MG tablet Take 1 tablet (10 mg total) by mouth daily. 11/26/20   Wanda Plump, MD    Family History Family History  Problem Relation Age of Onset   Arthritis Mother    Cancer Mother    Diabetes Father    Alcohol abuse Father    Cancer Sister  unknown type   Heart disease Brother    Obesity Brother    Obesity Brother    Heart disease Maternal Grandmother    Alzheimer's disease Paternal Grandmother    Drug abuse Daughter    Colon cancer Neg Hx    Prostate cancer Neg Hx    Colon polyps Neg Hx    Esophageal cancer Neg Hx    Rectal cancer Neg Hx    Stomach cancer Neg Hx     Social History Social History   Tobacco Use   Smoking status: Former    Current packs/day: 0.00    Average packs/day: 1 pack/day for 1 year (1.0 ttl pk-yrs)    Types: Cigarettes    Start date: 11/16/1963    Quit date: 11/15/1964    Years since quitting: 58.9   Smokeless tobacco: Never   Tobacco comments:    Questions not useful  Substance Use Topics   Alcohol use: Not Currently   Drug use: No     Allergies   Celexa  [citalopram hydrobromide], Depakote  [divalproex sodium], Methocarbamol, Paroxetine hcl, Smz-tmp ds [sulfamethoxazole-trimethoprim], Other, Oxycodone-acetaminophen, and Risperidone   Review of Systems Review of Systems  Skin:  Positive for rash.   Per HPI  Physical Exam Triage Vital Signs ED Triage Vitals  Encounter Vitals Group     BP 10/06/23 1901 (!) 142/81     Systolic BP Percentile --      Diastolic BP Percentile --      Pulse Rate 10/06/23 1901 75     Resp 10/06/23 1901 17     Temp 10/06/23 1901 (!) 97.4 F (36.3 C)     Temp Source 10/06/23 1901 Oral     SpO2 10/06/23 1901 96 %     Weight --       Height --      Head Circumference --      Peak Flow --      Pain Score 10/06/23 1900 3     Pain Loc --      Pain Education --      Exclude from Growth Chart --    No data found.  Updated Vital Signs BP (!) 142/81 (BP Location: Right Arm)   Pulse 75   Temp (!) 97.4 F (36.3 C) (Oral)   Resp 17   SpO2 96%   Physical Exam Vitals and nursing note reviewed. Exam conducted with a chaperone present Scientist, research (medical)).  Constitutional:      General: He is not in acute distress.    Appearance: He is not ill-appearing.  HENT:     Mouth/Throat:     Mouth: Mucous membranes are moist.     Pharynx: Oropharynx is clear.  Eyes:     Conjunctiva/sclera: Conjunctivae normal.  Cardiovascular:     Rate and Rhythm: Normal rate and regular rhythm.     Heart sounds: Normal heart sounds.  Pulmonary:     Effort: Pulmonary effort is normal.     Breath sounds: Normal breath sounds.  Genitourinary:    Penis: Phimosis, erythema and discharge present.      Testes: Normal.     Comments: Phimosis. Cannot visualize urethra. However there is erythema and some discharge at the foreskin. No obvious rash in the groin folds No testicular pain or swelling Skin:    General: Skin is warm and dry.  Neurological:     Mental Status: He is alert and oriented to person, place, and time.     UC Treatments /  Results  Labs (all labs ordered are listed, but only abnormal results are displayed) Labs Reviewed - No data to display  EKG  Radiology No results found.  Procedures Procedures   Medications Ordered in UC Medications - No data to display  Initial Impression / Assessment and Plan / UC Course  I have reviewed the triage vital signs and the nursing notes.  Pertinent labs & imaging results that were available during my care of the patient were reviewed by me and considered in my medical decision making (see chart for details).  Phimosis makes exam difficult. However he does have erythema and some  discharge. With urology history and past yeast infections, recommend to treat with nystatin cream twice daily. Advised follow up with urology if persisting. Patient is agreeable to plan  Final Clinical Impressions(s) / UC Diagnoses   Final diagnoses:  Groin rash     Discharge Instructions      Apply nystatin ointment twice daily for the next 1-2 weeks Contact urology for follow up if symptoms are persisting     ED Prescriptions     Medication Sig Dispense Auth. Provider   nystatin ointment (MYCOSTATIN) Apply 1 Application topically 2 (two) times daily. 30 g Filimon Miranda, Lurena Joiner, PA-C      PDMP not reviewed this encounter.   [place]cedes [place], Ray Church 10/06/23 2040

## 2023-10-06 NOTE — Discharge Instructions (Signed)
Apply nystatin ointment twice daily for the next 1-2 weeks Contact urology for follow up if symptoms are persisting

## 2023-10-06 NOTE — ED Triage Notes (Signed)
Pt presents with a groin rash x 1 wk. Pt states it is a pin brush pressure.

## 2023-10-11 DIAGNOSIS — J301 Allergic rhinitis due to pollen: Secondary | ICD-10-CM | POA: Diagnosis not present

## 2023-10-11 DIAGNOSIS — J3089 Other allergic rhinitis: Secondary | ICD-10-CM | POA: Diagnosis not present

## 2023-10-11 DIAGNOSIS — J3081 Allergic rhinitis due to animal (cat) (dog) hair and dander: Secondary | ICD-10-CM | POA: Diagnosis not present

## 2023-10-13 ENCOUNTER — Other Ambulatory Visit: Payer: Self-pay | Admitting: Internal Medicine

## 2023-10-13 DIAGNOSIS — J4541 Moderate persistent asthma with (acute) exacerbation: Secondary | ICD-10-CM

## 2023-10-18 ENCOUNTER — Encounter: Payer: Self-pay | Admitting: Internal Medicine

## 2023-10-19 DIAGNOSIS — J3081 Allergic rhinitis due to animal (cat) (dog) hair and dander: Secondary | ICD-10-CM | POA: Diagnosis not present

## 2023-10-19 DIAGNOSIS — J3089 Other allergic rhinitis: Secondary | ICD-10-CM | POA: Diagnosis not present

## 2023-10-19 DIAGNOSIS — J301 Allergic rhinitis due to pollen: Secondary | ICD-10-CM | POA: Diagnosis not present

## 2023-10-19 MED ORDER — ONETOUCH VERIO REFLECT W/DEVICE KIT: PACK | 0 refills | Status: AC

## 2023-10-26 DIAGNOSIS — J3089 Other allergic rhinitis: Secondary | ICD-10-CM | POA: Diagnosis not present

## 2023-10-26 DIAGNOSIS — J3081 Allergic rhinitis due to animal (cat) (dog) hair and dander: Secondary | ICD-10-CM | POA: Diagnosis not present

## 2023-10-26 DIAGNOSIS — J301 Allergic rhinitis due to pollen: Secondary | ICD-10-CM | POA: Diagnosis not present

## 2023-10-27 ENCOUNTER — Ambulatory Visit: Payer: Medicare Other | Admitting: Urology

## 2023-11-02 DIAGNOSIS — J3089 Other allergic rhinitis: Secondary | ICD-10-CM | POA: Diagnosis not present

## 2023-11-02 DIAGNOSIS — J301 Allergic rhinitis due to pollen: Secondary | ICD-10-CM | POA: Diagnosis not present

## 2023-11-21 ENCOUNTER — Other Ambulatory Visit: Payer: Self-pay | Admitting: Internal Medicine

## 2023-11-23 DIAGNOSIS — J301 Allergic rhinitis due to pollen: Secondary | ICD-10-CM | POA: Diagnosis not present

## 2023-11-23 DIAGNOSIS — J3089 Other allergic rhinitis: Secondary | ICD-10-CM | POA: Diagnosis not present

## 2023-12-01 DIAGNOSIS — J301 Allergic rhinitis due to pollen: Secondary | ICD-10-CM | POA: Diagnosis not present

## 2023-12-01 DIAGNOSIS — J3089 Other allergic rhinitis: Secondary | ICD-10-CM | POA: Diagnosis not present

## 2023-12-07 DIAGNOSIS — J301 Allergic rhinitis due to pollen: Secondary | ICD-10-CM | POA: Diagnosis not present

## 2023-12-07 DIAGNOSIS — J3089 Other allergic rhinitis: Secondary | ICD-10-CM | POA: Diagnosis not present

## 2023-12-14 DIAGNOSIS — J3089 Other allergic rhinitis: Secondary | ICD-10-CM | POA: Diagnosis not present

## 2023-12-14 DIAGNOSIS — J3081 Allergic rhinitis due to animal (cat) (dog) hair and dander: Secondary | ICD-10-CM | POA: Diagnosis not present

## 2023-12-14 DIAGNOSIS — J301 Allergic rhinitis due to pollen: Secondary | ICD-10-CM | POA: Diagnosis not present

## 2023-12-15 DIAGNOSIS — J3089 Other allergic rhinitis: Secondary | ICD-10-CM | POA: Diagnosis not present

## 2023-12-15 DIAGNOSIS — J301 Allergic rhinitis due to pollen: Secondary | ICD-10-CM | POA: Diagnosis not present

## 2023-12-19 ENCOUNTER — Other Ambulatory Visit: Payer: Self-pay | Admitting: Internal Medicine

## 2023-12-21 DIAGNOSIS — J3089 Other allergic rhinitis: Secondary | ICD-10-CM | POA: Diagnosis not present

## 2023-12-21 DIAGNOSIS — J301 Allergic rhinitis due to pollen: Secondary | ICD-10-CM | POA: Diagnosis not present

## 2023-12-28 DIAGNOSIS — J301 Allergic rhinitis due to pollen: Secondary | ICD-10-CM | POA: Diagnosis not present

## 2023-12-28 DIAGNOSIS — J3081 Allergic rhinitis due to animal (cat) (dog) hair and dander: Secondary | ICD-10-CM | POA: Diagnosis not present

## 2023-12-28 DIAGNOSIS — J3089 Other allergic rhinitis: Secondary | ICD-10-CM | POA: Diagnosis not present

## 2024-01-02 NOTE — Progress Notes (Unsigned)
Assessment/Plan:   1.  Neuroleptic induced parkinsonism  -As when I saw the patient back in 2015, he continues to have secondary parkinsonism secondary to Saphris, only it has progressed since that time.  We told the patient back in 2015 that symptoms certainly could progress if unable to get off the medication, which it did. -DaTscan done in 2014 was unremarkable. -Levodopa was started in August, 2024.  Patient does think that levodopa has helped his parkinsonism. -Patient is still on Saphris for bipolar    2.  Tardive dyskinesia             -This really is in the form of oral buccal lingual dyskinesia and started before levodopa.  This is certainly from Saphris.  It is mild     3.  Long history of Mnire's disease with associated vertigo             -Well-known to patient and diagnosed and apparently 58.  4.  Bipolar disorder  -On Saphris, which is the etiology of #1. Subjective:   Ryan Zhang was seen today in follow up for secondary parkinsonism.  My previous records were reviewed prior to todays visit as well as outside records available to me. Pt decided to add levodopa last visit to see if that would help.  He is still on Saphris for mental health/bipolar disorder.  Patient feels that levodopa has helped. He noted improvement the first week.  He reports falls are "significantly reduced from daily to very rare - maybe once since I saw you last."  No hallucinations.     Current prescribed movement disorder medications: carbidopa/levodopa 25/100 po tid   PREVIOUS MEDICATIONS: Sinemet  ALLERGIES:   Allergies  Allergen Reactions   Celexa  [Citalopram Hydrobromide] Other (See Comments)   Depakote  [Divalproex Sodium] Other (See Comments)   Methocarbamol     Other reaction(s): Hallucinations   Paroxetine Hcl     Other reaction(s): Insomnia   Smz-Tmp Ds [Sulfamethoxazole-Trimethoprim] Nausea And Vomiting   Other     "Symbrax" alteration in blood sugar, pt unsure if  low or high blood sugar   Oxycodone-Acetaminophen     Other reaction(s): Unknown   Risperidone Other (See Comments)    REACTION: hyper    CURRENT MEDICATIONS:  Current Meds  Medication Sig   albuterol (PROVENTIL) (2.5 MG/3ML) 0.083% nebulizer solution TAKE (2.5MG  TOTAL) BY NEBULIZATION EVERY 6 HOURS AS NEEDED FOR WHEEZING OR SHORTNESS OF BREATH   albuterol (VENTOLIN HFA) 108 (90 Base) MCG/ACT inhaler Inhale 2 puffs into the lungs every 6 (six) hours as needed for wheezing or shortness of breath.   asenapine (SAPHRIS) 5 MG SUBL 24 hr tablet Place 5 mg under the tongue at bedtime.   augmented betamethasone dipropionate (DIPROLENE-AF) 0.05 % cream Apply topically 2 (two) times daily.   Blood Glucose Monitoring Suppl (ONETOUCH VERIO REFLECT) w/Device KIT Check blood sugars 1-2 times daily   budesonide-formoterol (SYMBICORT) 160-4.5 MCG/ACT inhaler Inhale 2 puffs into the lungs 2 (two) times daily.    buPROPion (WELLBUTRIN XL) 150 MG 24 hr tablet Take 150 mg by mouth daily.   carbidopa-levodopa (SINEMET IR) 25-100 MG tablet Take 1 tablet by mouth 3 (three) times daily. 7am/11am/4pm   cholecalciferol (VITAMIN D3) 25 MCG (1000 UNIT) tablet Take 1,000 Units by mouth daily.   clonazePAM (KLONOPIN) 0.5 MG tablet Take 0.5 mg by mouth daily.   clopidogrel (PLAVIX) 75 MG tablet Take 1 tablet (75 mg total) by mouth daily.  DULoxetine (CYMBALTA) 60 MG capsule Take 60 mg by mouth every morning.   fexofenadine (ALLEGRA) 180 MG tablet Take 180 mg by mouth every morning. 8am [name])   fluticasone (FLONASE) 50 MCG/ACT nasal spray    lamoTRIgine (LAMICTAL) 25 MG tablet Take 50 mg by mouth at bedtime.   Lancets (ONETOUCH ULTRASOFT) lancets Check blood sugars no more than twice daily   losartan (COZAAR) 25 MG tablet Take 1 tablet (25 mg total) by mouth every evening.   montelukast (SINGULAIR) 10 MG tablet Take 10 mg by mouth at bedtime. [name]   Multiple Vitamin (MULTIVITAMIN) tablet Take 1  tablet by mouth daily. [employer] senior   naproxen (NAPROSYN) 500 MG tablet Take 500 mg by mouth daily as needed.   nystatin ointment (MYCOSTATIN) Apply 1 Application topically 2 (two) times daily.   ONETOUCH ULTRA test strip USE TO TEST GLUCOSE LEVELS 2 TIMES DAILY   pantoprazole (PROTONIX) 40 MG tablet Take 40 mg by mouth daily.   pioglitazone-metformin (ACTOPLUS MET) 15-850 MG tablet Take 1 tablet by mouth 2 (two) times daily with a meal.   simvastatin (ZOCOR) 40 MG tablet Take 1 tablet (40 mg total) by mouth at bedtime.   solifenacin (VESICARE) 10 MG tablet Take 1 tablet (10 mg total) by mouth daily.     Objective:   PHYSICAL EXAMINATION:    VITALS:   Vitals:   01/03/24 1300  BP: 120/76  Pulse: 78  SpO2: 98%  Weight: 240 lb 3.2 oz (109 kg)    GEN:  The patient appears stated age and is in NAD. HEENT:  Normocephalic, atraumatic.  The mucous membranes are moist. The superficial temporal arteries are without ropiness or tenderness. CV:  RRR Lungs:  CTAB Neck/HEME:  There are no carotid bruits bilaterally.  Neurological examination:  Orientation: The patient is alert and oriented x3. Cranial nerves: There is good facial symmetry with min facial hypomimia. The speech is fluent and clear. Soft palate rises symmetrically and there is no tongue deviation. Hearing is intact to conversational tone. Sensation: Sensation is intact to light touch throughout Motor: Strength is at least antigravity x4.  Movement examination: Tone: There is nl tone in the UE/LE Abnormal movements: none Coordination:  There is no decremation with RAM's, with any form of RAMS, including alternating supination and pronation of the forearm, hand opening and closing, finger taps, heel taps and toe taps.  Gait and Station: The patient pushes off to arise.  He has his upright walker but doesn't use it to show me walk in the hall.  He is a bit short stepped.    I have reviewed and interpreted the following  labs independently    Chemistry      Component Value Date/Time   NA 141 05/24/2023 1630   K 5.0 05/24/2023 1630   CL 103 05/24/2023 1630   CO2 26 05/24/2023 1630   BUN 33 (H) 05/24/2023 1630   CREATININE 1.01 05/24/2023 1630   CREATININE 1.01 12/25/2021 1620      Component Value Date/Time   CALCIUM 9.3 05/24/2023 1630   ALKPHOS 58 01/29/2023 1751   AST 21 01/29/2023 1751   ALT 11 01/29/2023 1751   BILITOT 1.0 01/29/2023 1751       Lab Results  Component Value Date   WBC 8.4 09/09/2023   HGB 11.2 (L) 09/09/2023   HCT 36.4 (L) 09/09/2023   MCV 81.1 09/09/2023   PLT 352 09/09/2023    Lab Results  Component Value Date  TSH 1.86 09/14/2021      Cc:  Wanda Plump, MD

## 2024-01-03 ENCOUNTER — Ambulatory Visit: Payer: Medicare Other | Admitting: Neurology

## 2024-01-03 VITALS — BP 120/76 | HR 78 | Wt 240.2 lb

## 2024-01-03 DIAGNOSIS — T43505A Adverse effect of unspecified antipsychotics and neuroleptics, initial encounter: Secondary | ICD-10-CM | POA: Diagnosis not present

## 2024-01-03 DIAGNOSIS — G2401 Drug induced subacute dyskinesia: Secondary | ICD-10-CM

## 2024-01-03 DIAGNOSIS — G2111 Neuroleptic induced parkinsonism: Secondary | ICD-10-CM | POA: Diagnosis not present

## 2024-01-03 DIAGNOSIS — J3089 Other allergic rhinitis: Secondary | ICD-10-CM | POA: Diagnosis not present

## 2024-01-03 DIAGNOSIS — J301 Allergic rhinitis due to pollen: Secondary | ICD-10-CM | POA: Diagnosis not present

## 2024-01-03 DIAGNOSIS — J3081 Allergic rhinitis due to animal (cat) (dog) hair and dander: Secondary | ICD-10-CM | POA: Diagnosis not present

## 2024-01-03 NOTE — Patient Instructions (Signed)
 Local and Online Resources for Power over Parkinson's Group?  Febraury 2025 ?  [place] PARKINSON'S GROUPS??  Power over Parkinson's Group:???  Upcoming Power over [employer] Meetings/Care Partner Support:? 2nd Wednesdays of the month at 2 pm:  February 12th, March 12th Contact Lynwood Dawley at [email or web address] or Amy Marriott at [email or web address] if interested in participating in this group?  ?  LOCAL EVENTS AND NEW OFFERINGS?  Dance Project Spring 2025:  January 14-May 20, Tuesdays 10-11 am.  All details on website: [email or web address] ACT FITNESS Chair Yoga classes "Train and Gain", Fridays 10 am, [employer].  Contact Gina at [phone].   PWR! Moves [place] class!  Wednesdays at 10 am.  Please contact Lonia Blood, PT at [email or web address] if interested. Health visitor Classes offering at [employer]!? Tuesdays (Chair Yoga)  and Wednesdays (PWR! Moves)  1:00 pm.?? Contact Aldona Lento [phone] or [email or web address] Drumming for Parkinson's will be held on 2nd and 4th Mondays at 11:00 am.?? Located at the [place] of the [place] ([address] [place] ? Contact Albertina Parr at [email or web address] or [phone]?  [facility] Parkinson's Tai Chi Class, Mondays at 11 am.  Call [phone] for details  [facility] at [facility]- [address], [place] Wednesdays- 4:00 - 5:00 PM - specifically for Parkinson's Disease.  Free!  Contact Denny Peon, [place] - [phone] (clinic) or  [phone] (cell) or by email: [email or web address]   ?ONLINE EDUCATION AND SUPPORT?  Parkinson Foundation:? www.parkinson.org?  PD Health at Home continues:? Mindfulness Mondays, Wellness Wednesdays, Fitness Fridays??  Upcoming Education:??  Learn More, Live Better.  Parkinson's Symposium Scientist, water quality and  [place], [place]).   Saturday, February 8th, 9:30-2:00 Staying Connected:  Nurturing Wellness beyond [employer]. Wednesday, February 12th, 1-2 pm Impulse Control Disorders:  Understanding and Managing the Challenges.  Wednesday, February 19th, 1-2 pm Veterans and Parkinson's:  [employer].  Thursday, February 27th, 2:30-4 pm Care Partner Conversations.  Wednesday, March 5th, 1-2 pm Expert Briefing:    Nourishing Wellness-Nutrition for [employer].  Wednesday, March 12th, 1-2 pm. Register for virtual education and Photographer (webinars) at [employer].gl  Please check out their website to sign up for emails and see their full online offerings??  ?  [employer]:? www.michaeljfox.org??  Third Thursday Webinars:? On the third Thursday of every month at 12 p.m. ET, join our free live webinars to learn about various aspects of living with Parkinson's disease and our work to speed medical breakthroughs.?  Upcoming Webinar:? How Government Policies Impact Parkinson's Research:  Wins and Next Steps.  Thursday, February 20th at 12 noon.  Check out additional information on their website to see their full online offerings?  ?  [employer]:? www.[email or web address].org?  Upcoming Webinar:   Overcoming Hurdles to Exercise.  Tuesday, February 18th at 5 pm Series:? Living with Parkinson's Meetup.?? Third Thursdays each month, 3 pm?  Care Partner Monthly Meetup.? With Jillene Bucks [place].? First Tuesday of each month, 2 pm?  Check out additional information to Live Well Today on their website?  ?  Parkinson and Movement Disorders (PMD) Alliance:? www.pmdalliance.org?  [employer] Online:? Online Education Events?  Sign up for emails, which are sent weekly to give you updates on programming and online offerings?  ?  Parkinson's Association of [place]:? www.parkinsonassociation.org?  Information on online support groups,  education events, and online exercises including Yoga, Parkinson's exercises and more-LOTS of information on links to PD resources and online events?  Virtual Support Group through  Parkinson's Association of the [place]-First Wednesday of each month at 2 pm   MOVEMENT AND EXERCISE OPPORTUNITIES?  PWR! Moves [place] class continues!  Wednesdays at 10 am.  Please contact Lonia Blood, PT at [email or web address] if interested. Parkinson's Exercise Class offerings at [employer]. Tuesdays (Chair yoga) and Wednesdays (PWR! Moves)  1:00 pm.?  Contact Aldona Lento [phone] or [email or web address]  Parkinson's Wellness Recovery (PWR! Moves)? www.pwr4life.org?  Info on the PWR! Virtual Experience:? You will have access to our expertise?through self-assessment, guided plans that start with the PD-specific fundamentals, educational content, tips, Q&A with an expert, and a growing Engineering geologist of PD-specific pre-recorded and live exercise classes of varying types and intensity - both physical and cognitive! If that is not enough, we offer 1:1 wellness consultations (in-person or virtual) to personalize your PWR! Dance movement psychotherapist.??  Parkinson [employer] Fridays:??  As part of the PD Health @ Home program, this free video series focuses each week on one aspect of fitness designed to support people living with Parkinson's.? These weekly videos highlight the Parkinson Foundation fitness guidelines for people with Parkinson's disease.?  MenusLocal.com.br?  Dance for PD website is offering free, live-stream classes throughout the week, as well as links to [employer] of classes:? https://danceforparkinsons.org/?  Virtual dance and Pilates for Parkinson's classes: Click on the Community Tab> Parkinson's Movement Initiative Tab.? To register for classes and for more information, visit www.[email or web address].co.za and click the "community"  tab.??  [employer] Parkinson's Cycling Classes??  [facility]:? Thursdays @ Noon-Live classes at [employer] ([employer] at [email or web address]?or [phone])?  [facility]: Classes Tuesday, Wednesday and Thursday (contact [place] at [email or web address] ?or [phone])?  [employer]?  Varied levels of classes are offered Tuesdays and Thursdays at [facility].??  Stretching with Byrd Hesselbach weekly class is also offered for people with Parkinson's?  To observe a class or for more information, call [phone] or email Patricia Nettle at [email or web address]?    ADDITIONAL SUPPORT AND RESOURCES?  [facility]Spring [employer]:  Chiropractor:? www.well-springsolutions.org/caregiver-education/caregiver-support-group.? You may also contact Loleta Chance at [place] -spring.org or [phone].????  Well-Spring Navigator:? Just1Navigator program, a?free service to help individuals and families through the journey of determining care for older adults.? The "Navigator" is a Child psychotherapist, Sidney Ace, who will speak with a prospective client and/or loved ones to provide an assessment of the situation and a set of recommendations for a personalized care plan -- all free of charge, and whether?[facility] [employer] offers the needed service or not. If the need is not a service we provide, we are well-connected with reputable programs in town that we can refer you to.? www.well-springsolutions.org or to speak with the Navigator, call [phone].?

## 2024-01-10 ENCOUNTER — Ambulatory Visit: Payer: Medicare Other | Admitting: Urology

## 2024-01-10 ENCOUNTER — Encounter: Payer: Self-pay | Admitting: Urology

## 2024-01-10 ENCOUNTER — Ambulatory Visit (INDEPENDENT_AMBULATORY_CARE_PROVIDER_SITE_OTHER): Payer: Medicare Other | Admitting: Internal Medicine

## 2024-01-10 VITALS — BP 164/94 | HR 88 | Ht 70.0 in | Wt 240.0 lb

## 2024-01-10 VITALS — BP 130/70 | HR 83 | Temp 97.2°F | Resp 12 | Ht 70.0 in | Wt 239.0 lb

## 2024-01-10 DIAGNOSIS — E1142 Type 2 diabetes mellitus with diabetic polyneuropathy: Secondary | ICD-10-CM | POA: Diagnosis not present

## 2024-01-10 DIAGNOSIS — I10 Essential (primary) hypertension: Secondary | ICD-10-CM | POA: Diagnosis not present

## 2024-01-10 DIAGNOSIS — I6523 Occlusion and stenosis of bilateral carotid arteries: Secondary | ICD-10-CM | POA: Diagnosis not present

## 2024-01-10 DIAGNOSIS — N35911 Unspecified urethral stricture, male, meatal: Secondary | ICD-10-CM | POA: Diagnosis not present

## 2024-01-10 DIAGNOSIS — D649 Anemia, unspecified: Secondary | ICD-10-CM | POA: Diagnosis not present

## 2024-01-10 DIAGNOSIS — N401 Enlarged prostate with lower urinary tract symptoms: Secondary | ICD-10-CM | POA: Diagnosis not present

## 2024-01-10 DIAGNOSIS — Z794 Long term (current) use of insulin: Secondary | ICD-10-CM | POA: Diagnosis not present

## 2024-01-10 DIAGNOSIS — E785 Hyperlipidemia, unspecified: Secondary | ICD-10-CM | POA: Diagnosis not present

## 2024-01-10 DIAGNOSIS — E1169 Type 2 diabetes mellitus with other specified complication: Secondary | ICD-10-CM | POA: Diagnosis not present

## 2024-01-10 LAB — LIPID PANEL
Cholesterol: 135 mg/dL (ref 0–200)
HDL: 55.9 mg/dL (ref >39.00)
LDL Cholesterol: 61 mg/dL (ref 0–99)
NonHDL: 78.85
Total CHOL/HDL Ratio: 2
Triglycerides: 89 mg/dL (ref 0.0–149.0)
VLDL: 17.8 mg/dL (ref 0.0–40.0)

## 2024-01-10 LAB — AST: AST: 13 U/L (ref 0–37)

## 2024-01-10 LAB — BASIC METABOLIC PANEL
BUN: 29 mg/dL — ABNORMAL HIGH (ref 6–23)
CO2: 26 meq/L (ref 19–32)
Calcium: 9.1 mg/dL (ref 8.4–10.5)
Chloride: 105 meq/L (ref 96–112)
Creatinine, Ser: 1.12 mg/dL (ref 0.40–1.50)
GFR: 64.22 mL/min (ref >60.00)
Glucose, Bld: 171 mg/dL — ABNORMAL HIGH (ref 70–99)
Potassium: 5.1 meq/L (ref 3.5–5.1)
Sodium: 143 meq/L (ref 135–145)

## 2024-01-10 LAB — ALT: ALT: 6 U/L (ref 0–53)

## 2024-01-10 LAB — HEMOGLOBIN A1C: Hgb A1c MFr Bld: 6.9 % — ABNORMAL HIGH (ref 4.6–6.5)

## 2024-01-10 MED ORDER — SOLIFENACIN SUCCINATE 10 MG PO TABS: 10.0000 mg | ORAL_TABLET | Freq: Every day | ORAL | 3 refills | Status: DC

## 2024-01-10 NOTE — Addendum Note (Signed)
 Addended by: Carolin Coy on: 01/10/2024 03:13 PM   Modules accepted: Orders

## 2024-01-10 NOTE — Progress Notes (Unsigned)
 Subjective:    Patient ID: Ryan Zhang, male    DOB: 1946/04/06, 78 y.o.   MRN: [id number]  DOS:  01/10/2024 Type of visit - description: rov, here with his wife  Chronic medical problems addressed. Chart reviewed. Overall feels good. History of anemia, not taking iron, no nausea or vomiting.  No blood in the stools Gait disorder: Much improved. No recent ambulatory BPs or ambulatory CBGs.   Review of Systems See above   Past Medical History:  Diagnosis Date   Allergy    allergy shots Dr. Corinda Gubler   Anemia    Anxiety    Asthma    moderate persistant   Bipolar affective (HCC)    Cataract    both eyes   Clotting disorder (HCC)    Due to blood thinner   COPD (chronic obstructive pulmonary disease) (HCC)    Depression    Diabetes mellitus    DJD (degenerative joint disease)    Dysphagia    Gallstones    hx of, s/p cholecystectomy   GERD (gastroesophageal reflux disease)    Hepatitis A    as teenager 60's   Hollenhorst plaque    right eye   Hyperlipidemia    Hypertension    Kidney stones    hx of   Meniere's disease    Motility disorder, esophageal    Obesity    Pancreatitis    Personal history of colonic polyps 11/2004   hyperplastic.   Stroke [facility])    per MRI    Past Surgical History:  Procedure Laterality Date   CATARACT EXTRACTION Bilateral 09/2015 and 10/2015   CHOLECYSTECTOMY     COLONOSCOPY     EYE SURGERY     JOINT REPLACEMENT     TOTAL KNEE ARTHROPLASTY Bilateral    both knees    Current Outpatient Medications  Medication Instructions   albuterol (PROVENTIL) (2.5 MG/3ML) 0.083% nebulizer solution TAKE (2.5MG  TOTAL) BY NEBULIZATION EVERY 6 HOURS AS NEEDED FOR WHEEZING OR SHORTNESS OF BREATH   albuterol (VENTOLIN HFA) 108 (90 Base) MCG/ACT inhaler 2 puffs, Inhalation, Every 6 hours PRN   asenapine (SAPHRIS) 5 mg, Daily at bedtime   augmented betamethasone dipropionate (DIPROLENE-AF) 0.05 % cream Topical, 2 times daily   Blood Glucose  Monitoring Suppl (ONETOUCH VERIO REFLECT) w/Device KIT Check blood sugars 1-2 times daily   budesonide-formoterol (SYMBICORT) 160-4.5 MCG/ACT inhaler 2 puffs, 2 times daily   buPROPion (WELLBUTRIN XL) 150 mg, Daily   carbidopa-levodopa (SINEMET IR) 25-100 MG tablet 1 tablet, Oral, 3 times daily, 7am/11am/4pm   cholecalciferol (VITAMIN D3) 1,000 Units, Daily   clonazePAM (KLONOPIN) 0.5 mg, Daily   clopidogrel (PLAVIX) 75 mg, Oral, Daily   DULoxetine (CYMBALTA) 60 mg, BH-each morning   fexofenadine (ALLEGRA) 180 mg, Every morning   fluticasone (FLONASE) 50 MCG/ACT nasal spray    lamoTRIgine (LAMICTAL) 50 mg, Daily at bedtime   Lancets (ONETOUCH ULTRASOFT) lancets Check blood sugars no more than twice daily   losartan (COZAAR) 25 mg, Oral, Every evening   montelukast (SINGULAIR) 10 mg, Daily at bedtime   Multiple Vitamin (MULTIVITAMIN) tablet 1 tablet, Daily   naproxen (NAPROSYN) 500 mg, Daily PRN   nystatin ointment (MYCOSTATIN) 1 Application, Topical, 2 times daily   ONETOUCH ULTRA test strip USE TO TEST GLUCOSE LEVELS 2 TIMES DAILY   pantoprazole (PROTONIX) 40 mg, Daily   pioglitazone-metformin (ACTOPLUS MET) 15-850 MG tablet 1 tablet, Oral, 2 times daily with meals   simvastatin (ZOCOR) 40 mg, Oral,  Daily at bedtime   solifenacin (VESICARE) 10 mg, Oral, Daily       Objective:   Physical Exam BP 130/70 (BP Location: Left Arm, Patient Position: Sitting, Cuff Size: Large)   Pulse 83   Temp (!) 97.2 F (36.2 C) (Temporal)   Resp 12   Ht 5\' 10"  (1.778 m)   Wt 239 lb (108.4 kg)   SpO2 95%   BMI 34.29 kg/m  General:   Well developed, NAD, BMI noted. HEENT:  Normocephalic . Face symmetric, atraumatic Lungs:  CTA B Normal respiratory effort, no intercostal retractions, no accessory muscle use. Heart: RRR,  no murmur.  Lower extremities: no pretibial edema bilaterally  Skin: Not pale. Not jaundice Neurologic:  alert & oriented X3.  Speech normal, gait assisted by a  walker. Psych--  Cognition and judgment appear intact.  Cooperative with normal attention span and concentration.  Behavior appropriate. No anxious or depressed appearing.  Seems in good spirits     Assessment   Assessment DM, mild neuropathy HTN Hyperlipidemia Morbid obesity (BMI 39 plus DM) Bipolar, Depression, cognitive issues after ECT 2007 (on disability) per   psychiatry, Dr Donell Beers DJD-- hydrocodone rarely rx by [place] ortho Asthma-Allergies ------------ Dr [name]  GERD, h/o dysphagia (chronic, neurogenic-transfer dysphagia? See GI note 12-2011) Neuro: --? stroke : saw neuro 2013 d/t B transient visual loss, ASA changed to plavix.  ---Saw neuro 11-2013: fall, syncope,parkinsonism d/t sapharis? CV: Enlarged ascending Ao , + Ao atherosclerosis  --per CT 12-2015,CT chest 08-2016 stable.    --MRI chest 10/2017 stable, no routine f/u Nl LE arterial dopplers 2015 Carotid artery disease: Per [place]  2011, last [place] 2018: <50% B,   08/2019 < 1-39% B, 09-2021 < 39%, stable consider recheck in 2 years  CT chest  ---RML  8mm: 07-2014, CT 12-2015, CT 08-2016, MRI chest 12,2018, CT chest 07/2018:Stable  HOH: L deaf L, R  hearing aid H/o Mnire's disease  Iron deficiency anemia: Noted 01/2018, + Hemoccult, 09-2018: Colonoscopy and EGD were completely normal  Urology- on Vesicare, sees urology  PLAN: DM: Last A1c increased from 6.6  to  7.0, no changes made.  On Actos plus met.  Recheck A1c. HTN: BP satisfactory, no ambulatory BPs,Continue losartan, check a BMP. Neuroleptic induced Parkinson's, tardive dyskinesia: Saw neurology 12-2023, they noted that he has started levodopa August 2024 which is helping.  Still on Saphris for bipolar disorder treatment (which  is likely the trigger of parkinsonism) Frequent falls, gait disorder: Reports is much improved since he has started treatment for Parkinson's.  Within his house does not need any help, when he goes for short trips uses a cane, if he goes  shopping  uses a walker. Would like to get even stronger, he plans to continue taking walks safely.  Declined PT referral for now. Hyperlipidemia: On simvastatin, check FLP AST ALT. Carotid artery disease: Per ultrasound 2022, recheck [place]. Anemia, history of:-CBC very good. RTC 4 months

## 2024-01-10 NOTE — Progress Notes (Signed)
 Assessment: 1. 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 Benign localized prostatic hyperplasia with lower urinary tract symptoms (LUTS)     Plan: Continue Vesicare-prescription renewed today Follow-up 1 year or sooner problems arise  Chief Complaint: LUTS  HPI: Ryan Zhang is a 78 y.o. male who presents for continued evaluation of a number of urologic issues.  Patient reports that he is stable from a GU standpoint. He has a long history of BPH/lower urinary tract symptoms with prominent urgency.  This has been longstanding.  He previously took tamsulosin and alfuzosin but did not notice much benefit.  Over the last 3 years he has been taking Vesicare 10 mg which has helped him somewhat. IPSS = 15/3.   PSA has been in the 1.0 range   Patient also has a long history of BX O/meatal stenosis.  He has had a repeat circumcision and dilation back in 2010.  He states that this is stable but he does use a funnel when he voids to direct his urine stream.  Portions of the above documentation were copied from a prior visit for review purposes only.  Allergies: Allergies  Allergen Reactions   Celexa  [Citalopram Hydrobromide] Other (See Comments)   Depakote  [Divalproex Sodium] Other (See Comments)   Methocarbamol     Other reaction(s): Hallucinations   Paroxetine Hcl     Other reaction(s): Insomnia   Smz-Tmp Ds [Sulfamethoxazole-Trimethoprim] Nausea And Vomiting   Other     "Symbrax" alteration in blood sugar, pt unsure if low or high blood sugar   Oxycodone-Acetaminophen     Other reaction(s): Unknown   Risperidone Other (See Comments)    REACTION: hyper    PMH: Past Medical History:  Diagnosis Date   Allergy    allergy shots Dr. Corinda Gubler   Anemia    Anxiety    Asthma    moderate persistant   Bipolar affective (HCC)    Cataract    both eyes   Clotting disorder (HCC)    Due to blood thinner   COPD (chronic obstructive pulmonary disease)  (HCC)    Depression    Diabetes mellitus    DJD (degenerative joint disease)    Dysphagia    Gallstones    hx of, s/p cholecystectomy   GERD (gastroesophageal reflux disease)    Hepatitis A    as teenager 60's   Hollenhorst plaque    right eye   Hyperlipidemia    Hypertension    Kidney stones    hx of   Meniere's disease    Motility disorder, esophageal    Obesity    Pancreatitis    Personal history of colonic polyps 11/2004   hyperplastic.   Stroke [facility])    per MRI    PSH: Past Surgical History:  Procedure Laterality Date   CATARACT EXTRACTION Bilateral 09/2015 and 10/2015   CHOLECYSTECTOMY     COLONOSCOPY     EYE SURGERY     JOINT REPLACEMENT     TOTAL KNEE ARTHROPLASTY Bilateral    both knees    SH: Social History   Tobacco Use   Smoking status: Former    Current packs/day: 0.00    Average packs/day: 1 pack/day for 1 year (1.0 ttl pk-yrs)    Types: Cigarettes    Start date: 11/16/1963    Quit date: 11/15/1964    Years since quitting: 59.1   Smokeless tobacco: Never   Tobacco comments:    Questions  not useful  Substance Use Topics   Alcohol use: Not Currently   Drug use: No    ROS: Constitutional:  Negative for fever, chills, weight loss CV: Negative for chest pain, previous MI, hypertension Respiratory:  Negative for shortness of breath, wheezing, sleep apnea, frequent cough GI:  Negative for nausea, vomiting, bloody stool, GERD  PE: BP (!) 164/94   Pulse 88   Ht 5\' 10"  (1.778 m)   Wt 240 lb (108.9 kg)   BMI 34.44 kg/m  GENERAL APPEARANCE:  Well appearing, well developed, well nourished, NAD

## 2024-01-10 NOTE — Patient Instructions (Signed)
 Will arrange for test on your carotid arteries (carotid ultrasound)  GO TO THE LAB : Get the blood work     Next visit with me in 4 months for a physical exam Please schedule it at the front desk

## 2024-01-11 DIAGNOSIS — J301 Allergic rhinitis due to pollen: Secondary | ICD-10-CM | POA: Diagnosis not present

## 2024-01-11 DIAGNOSIS — J3089 Other allergic rhinitis: Secondary | ICD-10-CM | POA: Diagnosis not present

## 2024-01-11 DIAGNOSIS — J3081 Allergic rhinitis due to animal (cat) (dog) hair and dander: Secondary | ICD-10-CM | POA: Diagnosis not present

## 2024-01-11 NOTE — Assessment & Plan Note (Signed)
 DM: Last A1c increased from 6.6  to  7.0, no changes made.  On Actos plus met.  Recheck A1c. HTN: BP satisfactory, no ambulatory BPs,Continue losartan, check a BMP. Neuroleptic induced Parkinson's, tardive dyskinesia: Saw neurology 12-2023, they noted that he has started levodopa August 2024 which is helping.  Still on Saphris for bipolar disorder treatment (which  is likely the trigger of parkinsonism) Frequent falls, gait disorder: Reports is much improved since he has started treatment for Parkinson's.  Within his house does not need any help, when he goes for short trips uses a cane, if he goes shopping  uses a walker. Would like to get even stronger, he plans to continue taking walks safely.  Declined PT referral for now. Hyperlipidemia: On simvastatin, check FLP AST ALT. Carotid artery disease: Per ultrasound 2022, recheck [place]. Anemia, history of:-CBC very good. RTC 4 months

## 2024-01-12 ENCOUNTER — Encounter: Payer: Self-pay | Admitting: Internal Medicine

## 2024-01-18 DIAGNOSIS — J3081 Allergic rhinitis due to animal (cat) (dog) hair and dander: Secondary | ICD-10-CM | POA: Diagnosis not present

## 2024-01-18 DIAGNOSIS — J301 Allergic rhinitis due to pollen: Secondary | ICD-10-CM | POA: Diagnosis not present

## 2024-01-18 DIAGNOSIS — J3089 Other allergic rhinitis: Secondary | ICD-10-CM | POA: Diagnosis not present

## 2024-01-25 DIAGNOSIS — J3089 Other allergic rhinitis: Secondary | ICD-10-CM | POA: Diagnosis not present

## 2024-01-25 DIAGNOSIS — J3081 Allergic rhinitis due to animal (cat) (dog) hair and dander: Secondary | ICD-10-CM | POA: Diagnosis not present

## 2024-01-25 DIAGNOSIS — J301 Allergic rhinitis due to pollen: Secondary | ICD-10-CM | POA: Diagnosis not present

## 2024-01-29 ENCOUNTER — Other Ambulatory Visit: Payer: Self-pay | Admitting: Neurology

## 2024-01-29 ENCOUNTER — Other Ambulatory Visit: Payer: Self-pay | Admitting: Internal Medicine

## 2024-01-29 DIAGNOSIS — G2401 Drug induced subacute dyskinesia: Secondary | ICD-10-CM

## 2024-01-29 DIAGNOSIS — T43505A Adverse effect of unspecified antipsychotics and neuroleptics, initial encounter: Secondary | ICD-10-CM

## 2024-02-01 DIAGNOSIS — J3089 Other allergic rhinitis: Secondary | ICD-10-CM | POA: Diagnosis not present

## 2024-02-01 DIAGNOSIS — J3081 Allergic rhinitis due to animal (cat) (dog) hair and dander: Secondary | ICD-10-CM | POA: Diagnosis not present

## 2024-02-01 DIAGNOSIS — J301 Allergic rhinitis due to pollen: Secondary | ICD-10-CM | POA: Diagnosis not present

## 2024-02-03 ENCOUNTER — Ambulatory Visit (HOSPITAL_COMMUNITY)
Admission: RE | Admit: 2024-02-03 | Discharge: 2024-02-03 | Disposition: A | Discharge status: Home / Self Care | Payer: Medicare Other | Source: Ambulatory Visit | Attending: Internal Medicine | Admitting: Internal Medicine

## 2024-02-03 DIAGNOSIS — I6523 Occlusion and stenosis of bilateral carotid arteries: Secondary | ICD-10-CM

## 2024-02-07 ENCOUNTER — Encounter: Payer: Self-pay | Admitting: Internal Medicine

## 2024-02-08 DIAGNOSIS — J301 Allergic rhinitis due to pollen: Secondary | ICD-10-CM | POA: Diagnosis not present

## 2024-02-08 DIAGNOSIS — J3081 Allergic rhinitis due to animal (cat) (dog) hair and dander: Secondary | ICD-10-CM | POA: Diagnosis not present

## 2024-02-08 DIAGNOSIS — J3089 Other allergic rhinitis: Secondary | ICD-10-CM | POA: Diagnosis not present

## 2024-02-12 ENCOUNTER — Other Ambulatory Visit: Payer: Self-pay | Admitting: Internal Medicine

## 2024-02-15 DIAGNOSIS — J301 Allergic rhinitis due to pollen: Secondary | ICD-10-CM | POA: Diagnosis not present

## 2024-02-15 DIAGNOSIS — J3081 Allergic rhinitis due to animal (cat) (dog) hair and dander: Secondary | ICD-10-CM | POA: Diagnosis not present

## 2024-02-15 DIAGNOSIS — J3089 Other allergic rhinitis: Secondary | ICD-10-CM | POA: Diagnosis not present

## 2024-02-21 DIAGNOSIS — J301 Allergic rhinitis due to pollen: Secondary | ICD-10-CM | POA: Diagnosis not present

## 2024-02-21 DIAGNOSIS — J3089 Other allergic rhinitis: Secondary | ICD-10-CM | POA: Diagnosis not present

## 2024-02-22 ENCOUNTER — Other Ambulatory Visit: Payer: Self-pay

## 2024-02-22 ENCOUNTER — Ambulatory Visit
Admission: RE | Admit: 2024-02-22 | Discharge: 2024-02-22 | Disposition: A | Discharge status: Home / Self Care | Source: Ambulatory Visit | Attending: Physician Assistant | Admitting: Physician Assistant

## 2024-02-22 VITALS — BP 137/77 | HR 81 | Temp 98.4°F | Resp 20 | Ht 70.0 in | Wt 250.0 lb

## 2024-02-22 DIAGNOSIS — B3789 Other sites of candidiasis: Secondary | ICD-10-CM

## 2024-02-22 DIAGNOSIS — L089 Local infection of the skin and subcutaneous tissue, unspecified: Secondary | ICD-10-CM

## 2024-02-22 MED ORDER — CEPHALEXIN 500 MG PO CAPS: 500.0000 mg | ORAL_CAPSULE | Freq: Four times a day (QID) | ORAL | 0 refills | Status: AC

## 2024-02-22 MED ORDER — FLUCONAZOLE 150 MG PO TABS: 150.0000 mg | ORAL_TABLET | ORAL | 0 refills | Status: DC | PRN

## 2024-02-22 NOTE — ED Triage Notes (Addendum)
 Pt presents with complaints of groin pain (and rash) x 4 days. Pt states his pain began on Sunday 4/6 and has worsened since. Tylenol taken for symptoms with little relief. Denies fevers at home. Pt currently rates his overall pain a 5/10.

## 2024-02-22 NOTE — Discharge Instructions (Addendum)
 You were seen today for concerns for rash in your genital region.  He reports that this has not improved significantly with nystatin cream at home.  I have sent in a medication called Diflucan for you to take once every 72 hours as needed for symptom management.  This is an antifungal medication typically helps with genital yeast infections.  Given the fact that your skin has become macerated and seems to be breaking down a little bit I am sending the prescription in for an antibiotic called Keflex.  Please take this every 6 hours as directed for 5 days.  You can continue to use Tylenol as needed for pain management.   If at any point you start to develop more severe symptoms such as fever that is not responding to over-the-counter medications, more severe rash, skin breakdown, drainage that looks like pus, severe pain in the groin region please go to the emergency room as these could be signs of a medical emergency.

## 2024-02-22 NOTE — ED Provider Notes (Signed)
 [facility]    CSN: [id number] Arrival date & time: 02/22/24  1502      History   Chief Complaint Chief Complaint  Patient presents with   Groin Pain    I've  been treating my groin for a fungal infections with Nystatin since Sunday. This should have dealt with the infections. I'm experiencing pain which has no lessen since Sunday. It's not the normal irritation. It is pain. Especially to the touch. - Entered by patient    HPI Ryan Zhang is a 78 y.o. male.   HPI  Patient reports concerns for yeast infection that is not resolving. He is concerned for secondary bacterial infection at this time He reports the rash started last week and seemed to get worse. He reports starting the Nystatin on Sunday and there has not been much improvement  He reports the pain seems to be getting worse but the underlying yeast infection seems to be clearing up  He denies bleeding, oozing or drainage in the area    Past Medical History:  Diagnosis Date   Allergy    allergy shots Dr. Corinda Gubler   Anemia    Anxiety    Asthma    moderate persistant   Bipolar affective (HCC)    Cataract    both eyes   Clotting disorder (HCC)    Due to blood thinner   COPD (chronic obstructive pulmonary disease) (HCC)    Depression    Diabetes mellitus    DJD (degenerative joint disease)    Dysphagia    Gallstones    hx of, s/p cholecystectomy   GERD (gastroesophageal reflux disease)    Hepatitis A    as teenager 60's   Hollenhorst plaque    right eye   Hyperlipidemia    Hypertension    Kidney stones    hx of   Meniere's disease    Motility disorder, esophageal    Obesity    Pancreatitis    Personal history of colonic polyps 11/2004   hyperplastic.   Stroke [facility])    per MRI    Patient Active Problem List   Diagnosis Date Noted   Tinea cruris 07/24/2020   PCP NOTES >>>>>>>>>>>>>>>>>>>>>>>>>>>>>>> 01/12/2016   Folliculitis 05/09/2015   Hearing loss in right ear 08/05/2014    Panic attack as reaction to stress 04/20/2012   Dysphagia, neurologic 01/07/2012   Morbid obesity (HCC) 01/07/2012   Chest pain, musculoskeletal 12/07/2011   Candidiasis of skin 08/12/2011   Annual physical exam 06/07/2011   OLECRANON BURSITIS 01/29/2011   PARTIAL ARTERIAL OCCLUSION OF RETINA 05/27/2010   Essential hypertension, benign 01/16/2010   ABNORMAL ELECTROCARDIOGRAM 01/14/2010   Asthma 10/14/2009   GERD 10/14/2009   Bipolar disorder (HCC) 02/20/2009   PHIMOSIS 02/20/2009   KNEE PAIN, CHRONIC 11/20/2008   HIP PAIN, RIGHT 08/19/2008   DM type 2 with diabetic peripheral neuropathy (HCC) 02/22/2007   Hyperlipidemia associated with type 2 diabetes mellitus (HCC) 02/22/2007   ALLERGIC RHINITIS, SEASONAL 02/22/2007   LOW BACK PAIN, CHRONIC 02/22/2007   MENIERE'S DISEASE, HX OF 02/22/2007    Past Surgical History:  Procedure Laterality Date   CATARACT EXTRACTION Bilateral 09/2015 and 10/2015   CHOLECYSTECTOMY     COLONOSCOPY     EYE SURGERY     JOINT REPLACEMENT     TOTAL KNEE ARTHROPLASTY Bilateral    both knees       Home Medications    Prior to Admission medications   Medication Sig Start Date  End Date Taking? Authorizing Provider  cephALEXin (KEFLEX) 500 MG capsule Take 1 capsule (500 mg total) by mouth 4 (four) times daily for 5 days. 02/22/24 02/27/24 Yes Juliza Machnik E, PA-C  fluconazole (DIFLUCAN) 150 MG tablet Take 1 tablet (150 mg total) by mouth every three (3) days as needed. May repeat in 3 days if symptoms not resolved 02/22/24  Yes Rich Paprocki E, PA-C  albuterol (PROVENTIL) (2.5 MG/3ML) 0.083% nebulizer solution TAKE (2.5MG  TOTAL) BY NEBULIZATION EVERY 6 HOURS AS NEEDED FOR WHEEZING OR SHORTNESS OF BREATH 10/15/23   Wanda Plump, MD  albuterol (VENTOLIN HFA) 108 (90 Base) MCG/ACT inhaler Inhale 2 puffs into the lungs every 6 (six) hours as needed for wheezing or shortness of breath. 02/11/20   Wanda Plump, MD  asenapine (SAPHRIS) 5 MG SUBL 24 hr tablet Place  5 mg under the tongue at bedtime.    [provider]  augmented betamethasone dipropionate (DIPROLENE-AF) 0.05 % cream Apply topically 2 (two) times daily. 04/19/22   Wanda Plump, MD  Blood Glucose Monitoring Suppl [facility] VERIO REFLECT) w/Device KIT Check blood sugars 1-2 times daily 10/19/23   Wanda Plump, MD  budesonide-formoterol [facility]) 160-4.5 MCG/ACT inhaler Inhale 2 puffs into the lungs 2 (two) times daily.     [provider]  buPROPion (WELLBUTRIN XL) 150 MG 24 hr tablet Take 150 mg by mouth daily. 02/15/21   [provider]  carbidopa-levodopa (SINEMET IR) 25-100 MG tablet TAKE ONE TABLET BY MOUTH THREE TIMES DAILY AT 7AM, 11AM, AND 4PM 01/30/24   Tat, Octaviano Batty, DO  cholecalciferol (VITAMIN D3) 25 MCG (1000 UNIT) tablet Take 1,000 Units by mouth daily.    [provider]  clonazePAM (KLONOPIN) 0.5 MG tablet Take 0.5 mg by mouth daily.    [provider]  clopidogrel (PLAVIX) 75 MG tablet Take 1 tablet (75 mg total) by mouth daily. 12/20/23   Wanda Plump, MD  DULoxetine (CYMBALTA) 60 MG capsule Take 60 mg by mouth every morning.    [provider]  fexofenadine (ALLEGRA) 180 MG tablet Take 180 mg by mouth every morning. 8am [name])    [provider]  fluticasone Aleda Grana) 50 MCG/ACT nasal spray  09/21/22   [provider]  lamoTRIgine (LAMICTAL) 25 MG tablet Take 50 mg by mouth at bedtime.    [provider]  Lancets Letta Pate ULTRASOFT) lancets Check blood sugars no more than twice daily 12/09/16   Wanda Plump, MD  losartan (COZAAR) 25 MG tablet Take 1 tablet (25 mg total) by mouth every evening. 08/17/23   Wanda Plump, MD  montelukast (SINGULAIR) 10 MG tablet Take 10 mg by mouth at bedtime. [name]    [provider]  Multiple Vitamin (MULTIVITAMIN) tablet Take 1 tablet by mouth daily. Herbalist, Historical, MD  naproxen (NAPROSYN) 500 MG tablet Take 500 mg by mouth daily  as needed.    [provider]  nystatin ointment (MYCOSTATIN) Apply 1 Application topically 2 (two) times daily. 10/06/23   Rising, Lurena Joiner, PA-C  ONETOUCH ULTRA test strip USE TO TEST GLUCOSE LEVELS 2 TIMES DAILY 06/29/23   Wanda Plump, MD  pantoprazole (PROTONIX) 40 MG tablet Take 1 tablet (40 mg total) by mouth daily before breakfast. 01/30/24   Wanda Plump, MD  pioglitazone-metformin (ACTOPLUS MET) [phone] MG tablet Take 1 tablet by mouth 2 (two) times daily with a meal. 11/22/23   Paz, Portage E,  MD  simvastatin (ZOCOR) 40 MG tablet TAKE ONE TABLET BY MOUTH DAILY AT BEDTIME 02/13/24   Wanda Plump, MD  solifenacin (VESICARE) 10 MG tablet Take 1 tablet (10 mg total) by mouth daily. 01/10/24   [name], MD    Family History Family History  Problem Relation Age of Onset   Arthritis Mother    Cancer Mother    Diabetes Father    Alcohol abuse Father    Cancer Sister        unknown type   Heart disease Brother    Obesity Brother    Obesity Brother    Heart disease Maternal Grandmother    Alzheimer's disease Paternal Grandmother    Drug abuse Daughter    Colon cancer Neg Hx    Prostate cancer Neg Hx    Colon polyps Neg Hx    Esophageal cancer Neg Hx    Rectal cancer Neg Hx    Stomach cancer Neg Hx     Social History Social History   Tobacco Use   Smoking status: Former    Current packs/day: 0.00    Average packs/day: 1 pack/day for 1 year (1.0 ttl pk-yrs)    Types: Cigarettes    Start date: 11/16/1963    Quit date: 11/15/1964    Years since quitting: 59.3   Smokeless tobacco: Never   Tobacco comments:    Questions not useful  Vaping Use   Vaping status: Never Used  Substance Use Topics   Alcohol use: Not Currently   Drug use: No     Allergies   Celexa  [citalopram hydrobromide], Depakote  [divalproex sodium], Methocarbamol, Paroxetine hcl, Smz-tmp ds [sulfamethoxazole-trimethoprim], Other, Oxycodone-acetaminophen, and Risperidone   Review of Systems Review  of Systems  Constitutional:  Negative for chills and fever.  Skin:  Positive for rash.     Physical Exam Triage Vital Signs ED Triage Vitals  Encounter Vitals Group     BP 02/22/24 1528 137/77     Systolic BP Percentile --      Diastolic BP Percentile --      Pulse Rate 02/22/24 1528 81     Resp 02/22/24 1528 20     Temp 02/22/24 1528 98.4 F (36.9 C)     Temp Source 02/22/24 1528 Temporal     SpO2 02/22/24 1528 95 %     Weight 02/22/24 1529 250 lb (113.4 kg)     Height 02/22/24 1533 5\' 10"  (1.778 m)     Head Circumference --      Peak Flow --      Pain Score 02/22/24 1528 5     Pain Loc --      Pain Education --      Exclude from Growth Chart --    No data found.  Updated Vital Signs BP 137/77 (BP Location: Right Arm)   Pulse 81   Temp 98.4 F (36.9 C) (Temporal)   Resp 20   Ht 5\' 10"  (1.778 m)   Wt 250 lb (113.4 kg)   SpO2 95%   BMI 35.87 kg/m   Visual Acuity Right Eye Distance:   Left Eye Distance:   Bilateral Distance:    Right Eye Near:   Left Eye Near:    Bilateral Near:     Physical Exam Vitals reviewed.  Constitutional:      General: He is awake.     Appearance: Normal appearance. He is well-developed and well-groomed.  HENT:     Head:  Normocephalic and atraumatic.  Genitourinary:    Pubic Area: Rash present.     Penis: Erythema and tenderness present.      Comments: Patient has erythematous and macerated skin along the penis that is tender to light palpation  Neurological:     Mental Status: He is alert.  Psychiatric:        Behavior: Behavior is cooperative.      UC Treatments / Results  Labs (all labs ordered are listed, but only abnormal results are displayed) Labs Reviewed - No data to display  EKG   Radiology No results found.  Procedures Procedures (including critical care time)  Medications Ordered in UC Medications - No data to display  Initial Impression / Assessment and Plan / UC Course  I have reviewed the  triage vital signs and the nursing notes.  Pertinent labs & imaging results that were available during my care of the patient were reviewed by me and considered in my medical decision making (see chart for details).      Final Clinical Impressions(s) / UC Diagnoses   Final diagnoses:  Candida rash of groin  Skin infection   Patient was seen today for concerns for genital yeast infection.  He reports that discomfort and rash started last week and he started treating with nystatin cream on Sunday.  He reports that symptoms appear to be getting worse despite home measures.  Physical exam is notable for erythematous and macerated skin along the penis and groin.  No obvious signs of swelling, purulent drainage or bleeding at this time.  Patient is worried about secondary bacterial infection given skin breakdown.  Will send in Diflucan x 2 treatments to assist with genital yeast infection.  Will also send in Keflex p.o. 4 times daily x 5 days for potential secondary bacterial infection.  ED and return precautions reviewed and provided in after visit summary.  Follow-up as needed for progressing or persistent symptoms    Discharge Instructions      You were seen today for concerns for rash in your genital region.  He reports that this has not improved significantly with nystatin cream at home.  I have sent in a medication called Diflucan for you to take once every 72 hours as needed for symptom management.  This is an antifungal medication typically helps with genital yeast infections.  Given the fact that your skin has become macerated and seems to be breaking down a little bit I am sending the prescription in for an antibiotic called Keflex.  Please take this every 6 hours as directed for 5 days.  You can continue to use Tylenol as needed for pain management.   If at any point you start to develop more severe symptoms such as fever that is not responding to over-the-counter medications, more severe  rash, skin breakdown, drainage that looks like pus, severe pain in the groin region please go to the emergency room as these could be signs of a medical emergency.     ED Prescriptions     Medication Sig Dispense Auth. Provider   fluconazole (DIFLUCAN) 150 MG tablet Take 1 tablet (150 mg total) by mouth every three (3) days as needed. May repeat in 3 days if symptoms not resolved 2 tablet Sariyah Corcino E, PA-C   cephALEXin (KEFLEX) 500 MG capsule Take 1 capsule (500 mg total) by mouth 4 (four) times daily for 5 days. 20 capsule Kanyla Omeara E, PA-C      PDMP not reviewed this  encounter.   Providence Crosby, PA-C 02/22/24 1629

## 2024-02-29 DIAGNOSIS — J3089 Other allergic rhinitis: Secondary | ICD-10-CM | POA: Diagnosis not present

## 2024-02-29 DIAGNOSIS — J301 Allergic rhinitis due to pollen: Secondary | ICD-10-CM | POA: Diagnosis not present

## 2024-03-07 DIAGNOSIS — J301 Allergic rhinitis due to pollen: Secondary | ICD-10-CM | POA: Diagnosis not present

## 2024-03-07 DIAGNOSIS — J3081 Allergic rhinitis due to animal (cat) (dog) hair and dander: Secondary | ICD-10-CM | POA: Diagnosis not present

## 2024-03-07 DIAGNOSIS — J3089 Other allergic rhinitis: Secondary | ICD-10-CM | POA: Diagnosis not present

## 2024-03-10 ENCOUNTER — Other Ambulatory Visit: Payer: Self-pay | Admitting: Internal Medicine

## 2024-03-14 ENCOUNTER — Encounter (HOSPITAL_BASED_OUTPATIENT_CLINIC_OR_DEPARTMENT_OTHER): Payer: Self-pay

## 2024-03-14 ENCOUNTER — Emergency Department (HOSPITAL_BASED_OUTPATIENT_CLINIC_OR_DEPARTMENT_OTHER)

## 2024-03-14 ENCOUNTER — Emergency Department (HOSPITAL_BASED_OUTPATIENT_CLINIC_OR_DEPARTMENT_OTHER)
Admission: EM | Admit: 2024-03-14 | Discharge: 2024-03-14 | Disposition: A | Discharge status: Home / Self Care | Attending: Emergency Medicine | Admitting: Emergency Medicine

## 2024-03-14 ENCOUNTER — Other Ambulatory Visit: Payer: Self-pay

## 2024-03-14 DIAGNOSIS — Z7902 Long term (current) use of antithrombotics/antiplatelets: Secondary | ICD-10-CM | POA: Insufficient documentation

## 2024-03-14 DIAGNOSIS — Z79899 Other long term (current) drug therapy: Secondary | ICD-10-CM | POA: Insufficient documentation

## 2024-03-14 DIAGNOSIS — I517 Cardiomegaly: Secondary | ICD-10-CM | POA: Diagnosis not present

## 2024-03-14 DIAGNOSIS — J301 Allergic rhinitis due to pollen: Secondary | ICD-10-CM | POA: Diagnosis not present

## 2024-03-14 DIAGNOSIS — I700 Atherosclerosis of aorta: Secondary | ICD-10-CM | POA: Diagnosis not present

## 2024-03-14 DIAGNOSIS — I10 Essential (primary) hypertension: Secondary | ICD-10-CM | POA: Insufficient documentation

## 2024-03-14 DIAGNOSIS — R0602 Shortness of breath: Secondary | ICD-10-CM | POA: Insufficient documentation

## 2024-03-14 DIAGNOSIS — J3081 Allergic rhinitis due to animal (cat) (dog) hair and dander: Secondary | ICD-10-CM | POA: Diagnosis not present

## 2024-03-14 DIAGNOSIS — R079 Chest pain, unspecified: Secondary | ICD-10-CM | POA: Diagnosis not present

## 2024-03-14 DIAGNOSIS — R0789 Other chest pain: Secondary | ICD-10-CM | POA: Insufficient documentation

## 2024-03-14 DIAGNOSIS — J3089 Other allergic rhinitis: Secondary | ICD-10-CM | POA: Diagnosis not present

## 2024-03-14 LAB — CBC
HCT: 36.7 % — ABNORMAL LOW (ref 39.0–52.0)
Hemoglobin: 11.6 g/dL — ABNORMAL LOW (ref 13.0–17.0)
MCH: 25.5 pg — ABNORMAL LOW (ref 26.0–34.0)
MCHC: 31.6 g/dL (ref 30.0–36.0)
MCV: 80.7 fL (ref 80.0–100.0)
Platelets: 320 10*3/uL (ref 150–400)
RBC: 4.55 MIL/uL (ref 4.22–5.81)
RDW: 16.5 % — ABNORMAL HIGH (ref 11.5–15.5)
WBC: 11.2 10*3/uL — ABNORMAL HIGH (ref 4.0–10.5)
nRBC: 0 % (ref 0.0–0.2)

## 2024-03-14 LAB — TROPONIN T, HIGH SENSITIVITY
Troponin T High Sensitivity: <15 ng/L (ref <19)
Troponin T High Sensitivity: <15 ng/L (ref <19)

## 2024-03-14 LAB — BASIC METABOLIC PANEL WITH GFR
Anion gap: 13 (ref 5–15)
BUN: 19 mg/dL (ref 8–23)
CO2: 19 mmol/L — ABNORMAL LOW (ref 22–32)
Calcium: 7.8 mg/dL — ABNORMAL LOW (ref 8.9–10.3)
Chloride: 110 mmol/L (ref 98–111)
Creatinine, Ser: 0.87 mg/dL (ref 0.61–1.24)
GFR, Estimated: >60 mL/min (ref >60)
Glucose, Bld: 95 mg/dL (ref 70–99)
Potassium: 3.5 mmol/L (ref 3.5–5.1)
Sodium: 142 mmol/L (ref 135–145)

## 2024-03-14 MED ORDER — ONDANSETRON HCL 4 MG/2ML IJ SOLN
4.0000 mg | Freq: Once | INTRAMUSCULAR | Status: AC
  Administered 2024-03-14: 4 mg via INTRAVENOUS
  Filled 2024-03-14: qty 2

## 2024-03-14 MED ORDER — FENTANYL CITRATE PF 50 MCG/ML IJ SOSY
50.0000 ug | PREFILLED_SYRINGE | Freq: Once | INTRAMUSCULAR | Status: AC
  Administered 2024-03-14: 50 ug via INTRAVENOUS
  Filled 2024-03-14: qty 1

## 2024-03-14 MED ORDER — HYDROCODONE-ACETAMINOPHEN 5-325 MG PO TABS: 1.0000 | ORAL_TABLET | Freq: Four times a day (QID) | ORAL | 0 refills | Status: DC | PRN

## 2024-03-14 NOTE — ED Notes (Signed)
 Pt. Reports he bent down to help the dog and now his chest is hurting.   Pt. Reports he has joint issues.

## 2024-03-14 NOTE — ED Provider Notes (Signed)
 [place] [facility] Provider Note   CSN: [id number] Arrival date & time: 03/14/24  1650     History  Chief Complaint  Patient presents with   Chest Pain   Shortness of Breath    Ryan Zhang is a 78 y.o. male who presents emergency department chief complaint of chest pain.  Patient reports that pain is across his entire chest. He states that his pain came on suddenly when he was leaning over to untangle his dog who was caught up in the leash.  He stood up and had immediate onset of a certain sensation of tightness across his chest which is worse with deep breathing and better with rest.  He states it is also worse when he is breathing heavily and feels he has to splint his breathing.  It does not radiate.  He denies hemoptysis, unilateral leg swelling, shortness of breath or diaphoresis.  He does have a history of carotid artery disease but denies any known history of coronary artery disease.  He is on medication for both hypertension and hyperlipidemia.  He denies any upper extremity weakness cough nausea or vomiting.  Patient initially offered pain medication and declined.   Chest Pain Associated symptoms: shortness of breath   Shortness of Breath Associated symptoms: chest pain        Home Medications Prior to Admission medications   Medication Sig Start Date End Date Taking? Authorizing Provider  albuterol  (PROVENTIL ) (2.5 MG/3ML) 0.083% nebulizer solution TAKE (2.5MG  TOTAL) BY NEBULIZATION EVERY 6 HOURS AS NEEDED FOR WHEEZING OR SHORTNESS OF BREATH 10/15/23   Ezell Hollow, MD  albuterol  (VENTOLIN  HFA) 108 [phone] Base) MCG/ACT inhaler Inhale 2 puffs into the lungs every 6 (six) hours as needed for wheezing or shortness of breath. 02/11/20   Paz, Jose E, MD  asenapine  (SAPHRIS ) 5 MG SUBL 24 hr tablet Place 5 mg under the tongue at bedtime.    [provider]  augmented betamethasone  dipropionate (DIPROLENE -AF) 0.05 % cream Apply  topically 2 (two) times daily. 04/19/22   Paz, Jose E, MD  Blood Glucose Monitoring Suppl [facility] VERIO REFLECT) w/Device KIT Check blood sugars 1-2 times daily 10/19/23   Paz, Jose E, MD  budesonide -formoterol (SYMBICORT) 160-4.5 MCG/ACT inhaler Inhale 2 puffs into the lungs 2 (two) times daily.     [provider]  buPROPion (WELLBUTRIN XL) 150 MG 24 hr tablet Take 150 mg by mouth daily. 02/15/21   [provider]  carbidopa -levodopa  (SINEMET  IR) 25-100 MG tablet TAKE ONE TABLET BY MOUTH THREE TIMES DAILY AT 7AM, 11AM, AND 4PM 01/30/24   Tat, Von Grumbling, DO  cholecalciferol (VITAMIN D3) 25 MCG (1000 UNIT) tablet Take 1,000 Units by mouth daily.    [provider]  clonazePAM  (KLONOPIN ) 0.5 MG tablet Take 0.5 mg by mouth daily.    [provider]  clopidogrel  (PLAVIX ) 75 MG tablet Take 1 tablet (75 mg total) by mouth daily. 12/20/23   Ezell Hollow, MD  DULoxetine  (CYMBALTA ) 60 MG capsule Take 60 mg by mouth every morning.    [provider]  fexofenadine (ALLEGRA) 180 MG tablet Take 180 mg by mouth every morning. 8am [name])    [provider]  fluconazole  (DIFLUCAN ) 150 MG tablet Take 1 tablet (150 mg total) by mouth every three (3) days as needed. May repeat in 3 days if symptoms not resolved 02/22/24   Mecum, Erin E, PA-C  fluticasone  (FLONASE ) 50 MCG/ACT nasal spray  09/21/22   [provider]  lamoTRIgine (LAMICTAL) 25 MG tablet Take 50 mg by mouth at bedtime.    [provider]  Lancets Emmett Harman ULTRASOFT) lancets Check blood sugars no more than twice daily 12/09/16   Paz, Jose E, MD  losartan  (COZAAR ) 25 MG tablet Take 1 tablet (25 mg total) by mouth every evening. 03/12/24   Paz, Jose E, MD  montelukast (SINGULAIR) 10 MG tablet Take 10 mg by mouth at bedtime. [name]    [provider]  Multiple Vitamin (MULTIVITAMIN) tablet Take 1 tablet by mouth daily. Herbalist, Historical, MD  naproxen   (NAPROSYN ) 500 MG tablet Take 500 mg by mouth daily as needed.    [provider]  nystatin  ointment (MYCOSTATIN ) Apply 1 Application topically 2 (two) times daily. 10/06/23   Rising, Ivette Marks, PA-C  ONETOUCH ULTRA test strip USE TO TEST GLUCOSE LEVELS 2 TIMES DAILY 06/29/23   Paz, Jose E, MD  pantoprazole  (PROTONIX ) 40 MG tablet Take 1 tablet (40 mg total) by mouth daily before breakfast. 01/30/24   Paz, Jose E, MD  pioglitazone -metformin  (ACTOPLUS MET ) 15-850 MG tablet Take 1 tablet by mouth 2 (two) times daily with a meal. 11/22/23   Ezell Hollow, MD  simvastatin  (ZOCOR ) 40 MG tablet TAKE ONE TABLET BY MOUTH DAILY AT BEDTIME 02/13/24   Paz, Jose E, MD  solifenacin  (VESICARE ) 10 MG tablet Take 1 tablet (10 mg total) by mouth daily. 01/10/24   [name], MD      Allergies    Celexa  [citalopram hydrobromide], Depakote  [divalproex sodium], Methocarbamol, Paroxetine hcl, Smz-tmp ds [sulfamethoxazole-trimethoprim], Other, Oxycodone-acetaminophen , and Risperidone    Review of Systems   Review of Systems  Respiratory:  Positive for shortness of breath.   Cardiovascular:  Positive for chest pain.    Physical Exam Updated Vital Signs BP 125/73 (BP Location: Right Arm)   Pulse 80   Temp 97.6 F (36.4 C) (Oral)   Resp 20   Ht 5\' 10"  (1.778 m)   Wt 113.4 kg   SpO2 100%   BMI 35.87 kg/m  Physical Exam Vitals and nursing note reviewed.  Constitutional:      General: He is not in acute distress.    Appearance: He is well-developed. He is not diaphoretic.  HENT:     Head: Normocephalic and atraumatic.  Eyes:     General: No scleral icterus.    Conjunctiva/sclera: Conjunctivae normal.  Cardiovascular:     Rate and Rhythm: Normal rate and regular rhythm.     Heart sounds: Normal heart sounds.  Pulmonary:     Effort: Pulmonary effort is normal. No respiratory distress.     Breath sounds: Normal breath sounds.  Chest:     Chest wall: Tenderness present.       Comments: Acute  spasm and tenderness noted across the entire chest worse on the right side. Abdominal:     Palpations: Abdomen is soft.     Tenderness: There is no abdominal tenderness.  Musculoskeletal:     Cervical back: Normal range of motion and neck supple.  Skin:    General: Skin is warm and dry.  Neurological:     Mental Status: He is alert.  Psychiatric:        Behavior: Behavior normal.    ED Results / Procedures / Treatments   Labs (all labs ordered are listed, but only abnormal results are displayed) Labs Reviewed  CBC - Abnormal; Notable  for the following components:      Result Value   WBC 11.2 (*)    Hemoglobin 11.6 (*)    HCT 36.7 (*)    MCH 25.5 (*)    RDW 16.5 (*)    All other components within normal limits  BASIC METABOLIC PANEL WITH GFR - Abnormal; Notable for the following components:   CO2 19 (*)    Calcium 7.8 (*)    All other components within normal limits  TROPONIN T, HIGH SENSITIVITY  TROPONIN T, HIGH SENSITIVITY    EKG EKG Interpretation Date/Time:  Wednesday March 14 2024 16:56:51 EDT Ventricular Rate:  86 PR Interval:  200 QRS Duration:  128 QT Interval:  414 QTC Calculation: 495 R Axis:   -3  Text Interpretation: Normal sinus rhythm Right bundle branch block Inferior infarct , age undetermined When compared with ECG of 05-Feb-2020 22:07, PREVIOUS ECG IS PRESENT No significant change since last tracing Confirmed by Almond Army ([id number]) on 03/14/2024 5:16:19 PM  Radiology DG Chest 2 View Result Date: 03/14/2024 CLINICAL DATA:  Left-sided chest pain with radiation of the left shoulder. EXAM: CHEST - 2 VIEW COMPARISON:  01/29/2023 and CT chest 01/29/2023. FINDINGS: Trachea is midline. Heart is enlarged. Thoracic aorta is calcified. Bibasilar scarring. No airspace consolidation or pleural fluid. IMPRESSION: No acute findings. Electronically Signed   By: Shearon Denis M.D.   On: 03/14/2024 17:25    Procedures Procedures    Medications Ordered in  ED Medications  fentaNYL  (SUBLIMAZE ) injection 50 mcg (50 mcg Intravenous Given 03/14/24 1927)  ondansetron  (ZOFRAN ) injection 4 mg (4 mg Intravenous Given 03/14/24 1927)    ED Course/ Medical Decision Making/ A&P Clinical Course as of 03/14/24 2134  Wed Mar 14, 2024  2132 WBC(!): 11.2 [AH]  2133 EKG 12-Lead [AH]    Clinical Course User Index [AH] Tama Fails, PA-C                                 Medical Decision Making Amount and/or Complexity of Data Reviewed Labs: ordered. Decision-making details documented in ED Course.    Details: Mild hypocalcemia of insignificant value, white count 11.2 hemoglobin 11.6 also insignificant workup.  Radiology: ordered and independent interpretation performed.    Details: Visualized and interpreted two-view chest x-ray which shows no acute findings ECG/medicine tests: independent interpretation performed. Decision-making details documented in ED Course.  Risk Prescription drug management.   Given the large differential diagnosis for DAJUAN ELVIN, the decision making in this case is of high complexity.  After evaluating all of the data points in this case, the presentation of Tayari K Whittingham is NOT consistent with Acute Coronary Syndrome (ACS) and/or myocardial ischemia, pulmonary embolism, aortic dissection; Borhaave's, significant arrythmia, pneumothorax, cardiac tamponade, or other emergent cardiopulmonary condition.  Further, the presentation of YOUNESS LAWES is NOT consistent with pericarditis, myocarditis, cholecystitis, pancreatitis, mediastinitis, endocarditis, new valvular disease.  Additionally, the presentation of DEZMEN GEST NOT consistent with flail chest, cardiac contusion, ARDS, or significant intra-thoracic or intra-abdominal bleeding.  Moreover, this presentation is NOT consistent with pneumonia, sepsis, or pyelonephritis.  The patient has a   Heart score moderate risk  His diagnosis is most consistent with chest wall  pain.    Strict return and follow-up precautions have been given by me personally or by detailed written instruction given verbally by nursing staff using the teach back method to the patient/family/caregiver(s).  Data Reviewed/Counseling:  I have reviewed the patient's vital signs, nursing notes, and other relevant tests/information. I had a detailed discussion regarding the historical points, exam findings, and any diagnostic results supporting the discharge diagnosis. I also discussed the need for outpatient follow-up and the need to return to the ED if symptoms worsen or if there are any questions or concerns that arise at home.          Final Clinical Impression(s) / ED Diagnoses Final diagnoses:  Chest wall pain    Rx / DC Orders ED Discharge Orders     None         Tama Fails, PA-C 03/14/24 2209    Almond Army, MD 03/16/24 1906

## 2024-03-14 NOTE — Discharge Instructions (Addendum)
Contact a health care provider if:  You have a fever.  Your chest pain becomes worse.  You have new symptoms.  Get help right away if:  You have nausea or vomiting.  You feel sweaty or light-headed.  You have a cough with mucus from your lungs (sputum) or you cough up blood.  You develop shortness of breath.  These symptoms may represent a serious problem that is an emergency. Do not wait to see if the symptoms will go away. Get medical help right away. Call your local emergency services (911 in the [place] Do not drive yourself to the hospital.

## 2024-03-14 NOTE — ED Triage Notes (Signed)
 Pt coming in with L sided C pain with intermittent radiation to left shoulder starting 90 minutes ago. Pain on inspiration reported. Pain is sharp in nature and came on suddenly when bending over. Associated weakness/fatigue/dizziness. Denies fever, N/V/D. Hx of bilateral carotid artery stenosis.

## 2024-03-19 ENCOUNTER — Other Ambulatory Visit: Payer: Self-pay

## 2024-03-19 ENCOUNTER — Encounter (HOSPITAL_BASED_OUTPATIENT_CLINIC_OR_DEPARTMENT_OTHER): Payer: Self-pay | Admitting: Emergency Medicine

## 2024-03-19 ENCOUNTER — Ambulatory Visit
Admission: RE | Admit: 2024-03-19 | Discharge: 2024-03-19 | Disposition: A | Discharge status: Home / Self Care | Source: Ambulatory Visit | Attending: Internal Medicine | Admitting: Internal Medicine

## 2024-03-19 ENCOUNTER — Other Ambulatory Visit (HOSPITAL_BASED_OUTPATIENT_CLINIC_OR_DEPARTMENT_OTHER): Payer: Self-pay

## 2024-03-19 ENCOUNTER — Emergency Department (HOSPITAL_BASED_OUTPATIENT_CLINIC_OR_DEPARTMENT_OTHER)
Admission: EM | Admit: 2024-03-19 | Discharge: 2024-03-19 | Disposition: A | Discharge status: Home / Self Care | Attending: Emergency Medicine | Admitting: Emergency Medicine

## 2024-03-19 ENCOUNTER — Emergency Department (HOSPITAL_BASED_OUTPATIENT_CLINIC_OR_DEPARTMENT_OTHER)

## 2024-03-19 VITALS — BP 149/78 | HR 83 | Temp 97.0°F | Resp 19

## 2024-03-19 DIAGNOSIS — E119 Type 2 diabetes mellitus without complications: Secondary | ICD-10-CM | POA: Insufficient documentation

## 2024-03-19 DIAGNOSIS — R369 Urethral discharge, unspecified: Secondary | ICD-10-CM | POA: Diagnosis present

## 2024-03-19 DIAGNOSIS — R300 Dysuria: Secondary | ICD-10-CM | POA: Diagnosis not present

## 2024-03-19 DIAGNOSIS — N481 Balanitis: Secondary | ICD-10-CM | POA: Insufficient documentation

## 2024-03-19 DIAGNOSIS — N471 Phimosis: Secondary | ICD-10-CM | POA: Diagnosis not present

## 2024-03-19 DIAGNOSIS — K4020 Bilateral inguinal hernia, without obstruction or gangrene, not specified as recurrent: Secondary | ICD-10-CM | POA: Diagnosis not present

## 2024-03-19 LAB — BASIC METABOLIC PANEL WITH GFR
Anion gap: 11 (ref 5–15)
BUN: 30 mg/dL — ABNORMAL HIGH (ref 8–23)
CO2: 22 mmol/L (ref 22–32)
Calcium: 9.2 mg/dL (ref 8.9–10.3)
Chloride: 106 mmol/L (ref 98–111)
Creatinine, Ser: 0.92 mg/dL (ref 0.61–1.24)
GFR, Estimated: >60 mL/min (ref >60)
Glucose, Bld: 139 mg/dL — ABNORMAL HIGH (ref 70–99)
Potassium: 3.4 mmol/L — ABNORMAL LOW (ref 3.5–5.1)
Sodium: 139 mmol/L (ref 135–145)

## 2024-03-19 LAB — URINALYSIS, ROUTINE W REFLEX MICROSCOPIC
Bilirubin Urine: NEGATIVE
Glucose, UA: NEGATIVE mg/dL
Ketones, ur: NEGATIVE mg/dL
Leukocytes,Ua: NEGATIVE
Nitrite: NEGATIVE
Protein, ur: NEGATIVE mg/dL
Specific Gravity, Urine: >=1.03 (ref 1.005–1.030)
pH: 5.5 (ref 5.0–8.0)

## 2024-03-19 LAB — CBC WITH DIFFERENTIAL/PLATELET
Abs Immature Granulocytes: 0.07 10*3/uL (ref 0.00–0.07)
Basophils Absolute: 0.1 10*3/uL (ref 0.0–0.1)
Basophils Relative: 1 %
Eosinophils Absolute: 0.4 10*3/uL (ref 0.0–0.5)
Eosinophils Relative: 5 %
HCT: 32.5 % — ABNORMAL LOW (ref 39.0–52.0)
Hemoglobin: 10.3 g/dL — ABNORMAL LOW (ref 13.0–17.0)
Immature Granulocytes: 1 %
Lymphocytes Relative: 23 %
Lymphs Abs: 2.2 10*3/uL (ref 0.7–4.0)
MCH: 25.9 pg — ABNORMAL LOW (ref 26.0–34.0)
MCHC: 31.7 g/dL (ref 30.0–36.0)
MCV: 81.7 fL (ref 80.0–100.0)
Monocytes Absolute: 0.7 10*3/uL (ref 0.1–1.0)
Monocytes Relative: 7 %
Neutro Abs: 5.8 10*3/uL (ref 1.7–7.7)
Neutrophils Relative %: 63 %
Platelets: 361 10*3/uL (ref 150–400)
RBC: 3.98 MIL/uL — ABNORMAL LOW (ref 4.22–5.81)
RDW: 16.3 % — ABNORMAL HIGH (ref 11.5–15.5)
WBC: 9.2 10*3/uL (ref 4.0–10.5)
nRBC: 0 % (ref 0.0–0.2)

## 2024-03-19 LAB — URINALYSIS, MICROSCOPIC (REFLEX)

## 2024-03-19 MED ORDER — KETOCONAZOLE 2 % EX CREA: 1.0000 | TOPICAL_CREAM | Freq: Every day | CUTANEOUS | 0 refills | Status: DC

## 2024-03-19 MED ORDER — CEPHALEXIN 500 MG PO CAPS: 500.0000 mg | ORAL_CAPSULE | Freq: Four times a day (QID) | ORAL | 0 refills | Status: DC

## 2024-03-19 MED ORDER — ONDANSETRON HCL 4 MG/2ML IJ SOLN
4.0000 mg | Freq: Once | INTRAMUSCULAR | Status: AC
  Administered 2024-03-19: 4 mg via INTRAVENOUS
  Filled 2024-03-19: qty 2

## 2024-03-19 MED ORDER — IOHEXOL 300 MG/ML  SOLN
100.0000 mL | Freq: Once | INTRAMUSCULAR | Status: AC | PRN
  Administered 2024-03-19: 100 mL via INTRAVENOUS

## 2024-03-19 MED ORDER — CEPHALEXIN 250 MG PO CAPS: 1000.0000 mg | ORAL_CAPSULE | Freq: Once | ORAL | Status: DC

## 2024-03-19 MED ORDER — FLUCONAZOLE 150 MG PO TABS
150.0000 mg | ORAL_TABLET | Freq: Every day | ORAL | 0 refills | Status: DC
  Filled 2024-03-19: qty 7, 7d supply, fill #0

## 2024-03-19 MED ORDER — MORPHINE SULFATE (PF) 4 MG/ML IV SOLN
4.0000 mg | Freq: Once | INTRAVENOUS | Status: AC
  Administered 2024-03-19: 4 mg via INTRAVENOUS
  Filled 2024-03-19: qty 1

## 2024-03-19 MED ORDER — KETOCONAZOLE 2 % EX CREA
1.0000 | TOPICAL_CREAM | Freq: Every day | CUTANEOUS | 0 refills | Status: DC
  Filled 2024-03-19 (×2): qty 15, 15d supply, fill #0

## 2024-03-19 MED ORDER — FLUCONAZOLE 150 MG PO TABS: 150.0000 mg | ORAL_TABLET | Freq: Every day | ORAL | 0 refills | Status: DC

## 2024-03-19 MED ORDER — MORPHINE SULFATE 15 MG PO TABS
7.5000 mg | ORAL_TABLET | ORAL | 0 refills | Status: DC | PRN
  Filled 2024-03-19: qty 3, 1d supply, fill #0

## 2024-03-19 MED ORDER — FLUCONAZOLE 150 MG PO TABS
150.0000 mg | ORAL_TABLET | Freq: Once | ORAL | Status: AC
  Administered 2024-03-19: 150 mg via ORAL
  Filled 2024-03-19: qty 1

## 2024-03-19 MED ORDER — CEPHALEXIN 500 MG PO CAPS
500.0000 mg | ORAL_CAPSULE | Freq: Four times a day (QID) | ORAL | 0 refills | Status: DC
  Filled 2024-03-19 (×2): qty 20, 5d supply, fill #0

## 2024-03-19 NOTE — ED Triage Notes (Addendum)
 Pt states Thursday he started having urinary discomfort. States his penis is "hidden". Now pain is "unbearable"   He has been taking Tylenol  and hydrocodone  at home.

## 2024-03-19 NOTE — ED Notes (Signed)
 Patient is being discharged from the Urgent Care and sent to the Emergency Department via POV . Per Shella Devoid, FNP, patient is in need of higher level of care due to penile/urinary infection. Patient is aware and verbalizes understanding of plan of care.  Vitals:   03/19/24 1031  BP: (!) 149/78  Pulse: 83  Resp: 19  Temp: (!) 97 F (36.1 C)  SpO2: 93%

## 2024-03-19 NOTE — ED Provider Notes (Signed)
 [facility] Provider Note   CSN: [id number] Arrival date & time: 03/19/24  1115     History  Chief Complaint  Patient presents with   Penile Discharge    Ryan Zhang is a 78 y.o. male.  78 yo M with a chief complaints of penile pain.  He has had this problem off and on.  Worsening over the past few days.  No fevers.  He said he was seen by his doctor and was told that his white blood cell count was up.  He went to urgent care today and there was some concern that they could not visualize his penis and so they sent him to the ED for evaluation.   Penile Discharge       Home Medications Prior to Admission medications   Medication Sig Start Date End Date Taking? Authorizing Provider  morphine  (MSIR) 15 MG tablet Take 0.5 tablets (7.5 mg total) by mouth every 4 (four) hours as needed. 03/19/24  Yes Albertus Hughs, DO  albuterol  (PROVENTIL ) (2.5 MG/3ML) 0.083% nebulizer solution TAKE 3MLS (2.5MG  TOTAL) BY NEBULIZATION EVERY 6 HOURS AS NEEDED FOR WHEEZING OR SHORTNESS OF BREATH 10/15/23   Ezell Hollow, MD  albuterol  (VENTOLIN  HFA) [phone] Base) MCG/ACT inhaler Inhale 2 puffs into the lungs every 6 (six) hours as needed for wheezing or shortness of breath. 02/11/20   Paz, Jose E, MD  asenapine  (SAPHRIS ) 5 MG SUBL 24 hr tablet Place 5 mg under the tongue at bedtime.    [provider]  augmented betamethasone  dipropionate (DIPROLENE -AF) 0.05 % cream Apply topically 2 (two) times daily. 04/19/22   Paz, Jose E, MD  Blood Glucose Monitoring Suppl [facility] VERIO REFLECT) w/Device KIT Check blood sugars 1-2 times daily 10/19/23   Paz, Jose E, MD  budesonide -formoterol (SYMBICORT) 160-4.5 MCG/ACT inhaler Inhale 2 puffs into the lungs 2 (two) times daily.     [provider]  buPROPion (WELLBUTRIN XL) 150 MG 24 hr tablet Take 150 mg by mouth daily. 02/15/21   [provider]  carbidopa -levodopa  (SINEMET  IR) 25-100 MG tablet TAKE ONE  TABLET BY MOUTH THREE TIMES DAILY AT 7AM, 11AM, AND 4PM 01/30/24   Tat, Von Grumbling, DO  cephALEXin  (KEFLEX ) 500 MG capsule Take 1 capsule (500 mg total) by mouth 4 (four) times daily. 03/19/24   Albertus Hughs, DO  cholecalciferol (VITAMIN D3) 25 MCG (1000 UNIT) tablet Take 1,000 Units by mouth daily.    [provider]  clonazePAM  (KLONOPIN ) 0.5 MG tablet Take 0.5 mg by mouth daily.    [provider]  clopidogrel  (PLAVIX ) 75 MG tablet Take 1 tablet (75 mg total) by mouth daily. 12/20/23   Ezell Hollow, MD  DULoxetine  (CYMBALTA ) 60 MG capsule Take 60 mg by mouth every morning.    [provider]  fexofenadine (ALLEGRA) 180 MG tablet Take 180 mg by mouth every morning. 8am [name])    [provider]  fluconazole  (DIFLUCAN ) 150 MG tablet Take 1 tablet (150 mg total) by mouth daily. 03/19/24   Albertus Hughs, DO  fluticasone  (FLONASE ) 50 MCG/ACT nasal spray  09/21/22   [provider]  HYDROcodone -acetaminophen  (NORCO/VICODIN) 5-325 MG tablet Take 1-2 tablets by mouth every 6 (six) hours as needed for severe pain (pain score 7-10). 03/14/24   Harris, Abigail, PA-C  ketoconazole  (NIZORAL ) 2 % cream Apply 1 Application topically daily. 03/19/24   Albertus Hughs, DO  lamoTRIgine (LAMICTAL) 25 MG tablet Take 50  mg by mouth at bedtime.    [provider]  Lancets Emmett Harman ULTRASOFT) lancets Check blood sugars no more than twice daily 12/09/16   Paz, Jose E, MD  losartan  (COZAAR ) 25 MG tablet Take 1 tablet (25 mg total) by mouth every evening. 03/12/24   Paz, Jose E, MD  montelukast (SINGULAIR) 10 MG tablet Take 10 mg by mouth at bedtime. [name]    [provider]  Multiple Vitamin (MULTIVITAMIN) tablet Take 1 tablet by mouth daily. Herbalist, Historical, MD  naproxen  (NAPROSYN ) 500 MG tablet Take 500 mg by mouth daily as needed.    [provider]  nystatin  ointment (MYCOSTATIN ) Apply 1 Application topically 2 (two) times daily.  10/06/23   Rising, Ivette Marks, PA-C  ONETOUCH ULTRA test strip USE TO TEST GLUCOSE LEVELS 2 TIMES DAILY 06/29/23   Paz, Jose E, MD  pantoprazole  (PROTONIX ) 40 MG tablet Take 1 tablet (40 mg total) by mouth daily before breakfast. 01/30/24   Paz, Jose E, MD  pioglitazone -metformin  (ACTOPLUS MET ) 15-850 MG tablet Take 1 tablet by mouth 2 (two) times daily with a meal. 11/22/23   Ezell Hollow, MD  simvastatin  (ZOCOR ) 40 MG tablet TAKE ONE TABLET BY MOUTH DAILY AT BEDTIME 02/13/24   Paz, Jose E, MD  solifenacin  (VESICARE ) 10 MG tablet Take 1 tablet (10 mg total) by mouth daily. 01/10/24   [name], MD      Allergies    Celexa  [citalopram hydrobromide], Depakote  [divalproex sodium], Methocarbamol, Paroxetine hcl, Smz-tmp ds [sulfamethoxazole-trimethoprim], Other, Oxycodone-acetaminophen , and Risperidone    Review of Systems   Review of Systems  Genitourinary:  Positive for penile discharge.    Physical Exam Updated Vital Signs BP 136/65   Pulse 80   Temp 97.8 F (36.6 C)   Resp (!) 22   Ht 5\' 10"  (1.778 m)   Wt 113.4 kg   SpO2 95%   BMI 35.87 kg/m  Physical Exam Vitals and nursing note reviewed.  Constitutional:      Appearance: He is well-developed.  HENT:     Head: Normocephalic and atraumatic.  Eyes:     Pupils: Pupils are equal, round, and reactive to light.  Neck:     Vascular: No JVD.  Cardiovascular:     Rate and Rhythm: Normal rate and regular rhythm.     Heart sounds: No murmur heard.    No friction rub. No gallop.  Pulmonary:     Effort: No respiratory distress.     Breath sounds: No wheezing.  Abdominal:     General: There is no distension.     Tenderness: There is no abdominal tenderness. There is no guarding or rebound.  Genitourinary:    Comments: Patient with significant subcutaneous tissue buildup around the penis.  There is some erythema some purulent appearing drainage the tip of the visible area of the penis.  Some mild skin breakdown as  well. Musculoskeletal:        General: Normal range of motion.     Cervical back: Normal range of motion and neck supple.  Skin:    Coloration: Skin is not pale.     Findings: No rash.  Neurological:     Mental Status: He is alert and oriented to person, place, and time.  Psychiatric:        Behavior: Behavior normal.     ED Results / Procedures / Treatments   Labs (all labs ordered are listed, but  only abnormal results are displayed) Labs Reviewed  CBC WITH DIFFERENTIAL/PLATELET - Abnormal; Notable for the following components:      Result Value   RBC 3.98 (*)    Hemoglobin 10.3 (*)    HCT 32.5 (*)    MCH 25.9 (*)    RDW 16.3 (*)    All other components within normal limits  BASIC METABOLIC PANEL WITH GFR - Abnormal; Notable for the following components:   Potassium 3.4 (*)    Glucose, Bld 139 (*)    BUN 30 (*)    All other components within normal limits  URINALYSIS, ROUTINE W REFLEX MICROSCOPIC - Abnormal; Notable for the following components:   Hgb urine dipstick TRACE (*)    All other components within normal limits  URINALYSIS, MICROSCOPIC (REFLEX) - Abnormal; Notable for the following components:   Bacteria, UA FEW (*)    All other components within normal limits    EKG None  Radiology CT PELVIS W CONTRAST Result Date: 03/19/2024 CLINICAL DATA:  Penile infection. EXAM: CT PELVIS WITH CONTRAST TECHNIQUE: Multidetector CT imaging of the pelvis was performed using the standard protocol following the bolus administration of intravenous contrast. RADIATION DOSE REDUCTION: This exam was performed according to the departmental dose-optimization program which includes automated exposure control, adjustment of the mA and/or kV according to patient size and/or use of iterative reconstruction technique. CONTRAST:  OMNIPAQUE  IOHEXOL  300 MG/ML  SOLN COMPARISON:  01/29/2023. FINDINGS: Urinary Tract:  No acute abnormality visualized. Bowel: Partially evaluated diverticulosis  of the descending and sigmoid colon without evidence of focal inflammatory changes. Vascular/Lymphatic: Aortoiliac atherosclerosis. Similar mildly prominent right inguinal lymph node measuring up to 11 mm in short axis, may be reactive. Reproductive: Penis appears retracted. Mild soft tissue thickening extending along the distal penis with surrounding stranding and ill-defined fluid. No discrete loculated fluid collection identified. No soft tissue gas. Small fat containing bilateral inguinal hernias. Other:  No abdominopelvic ascites.  No intraperitoneal free air. Musculoskeletal: No acute osseous abnormality. No suspicious osseous lesion. Degenerative disc disease at L5-S1. IMPRESSION: 1. Penis is retracted with mild soft tissue thickening distally with surrounding stranding and ill-defined fluid, most compatible with reported history of infection. No loculated fluid collection identified, although, underlying phlegmonous change cannot be excluded. No soft tissue gas. 2. Similar mildly prominent right inguinal lymph node, may be reactive. 3.  Aortic Atherosclerosis (ICD10-I70.0). Electronically Signed   By: Mannie Seek M.D.   On: 03/19/2024 14:02    Procedures Procedures    Medications Ordered in ED Medications  fluconazole  (DIFLUCAN ) tablet 150 mg (has no administration in time range)  morphine  (PF) 4 MG/ML injection 4 mg (4 mg Intravenous Given 03/19/24 1152)  ondansetron  (ZOFRAN ) injection 4 mg (4 mg Intravenous Given 03/19/24 1151)  iohexol  (OMNIPAQUE ) 300 MG/ML solution 100 mL (100 mLs Intravenous Contrast Given 03/19/24 1309)    ED Course/ Medical Decision Making/ A&P                                 Medical Decision Making Amount and/or Complexity of Data Reviewed Labs: ordered. Radiology: ordered.  Risk Prescription drug management.   78 yo M with a chief complaint of penile pain.  Going on for couple days.  Having some skin breakdown and some drainage.  He went to urgent care and  they were concerned that they could not visualize the penis and sent him here for evaluation.  I  am less concerned about the ability to see the penis, with his history of diabetes and recurrent infections will obtain CT imaging to assess for deep space infection.  Likely started on oral antibiotics and antifungal medications and urology follow-up.  No leukocytosis, no significant electrolyte abnormalities.  UA negative for infection.  CT pelvis with contrast with fluid around the penis.  No obvious abscess.  I discussed the case with urology on-call, Alla Ar, NP, he recommends starting the patient on oral antifungals and following up closely with urology.  Asked about antibiotics and he thought that this could make the case worse in case of fungal etiology.  Discussed with the patient and family.  3:00 PM:  I have discussed the diagnosis/risks/treatment options with the patient and family.  Evaluation and diagnostic testing in the emergency department does not suggest an emergent condition requiring admission or immediate intervention beyond what has been performed at this time.  They will follow up with Urology. We also discussed returning to the ED immediately if new or worsening sx occur. We discussed the sx which are most concerning (e.g., sudden worsening pain, fever, inability to tolerate by mouth) that necessitate immediate return. Medications administered to the patient during their visit and any new prescriptions provided to the patient are listed below.  Medications given during this visit Medications  fluconazole  (DIFLUCAN ) tablet 150 mg (has no administration in time range)  morphine  (PF) 4 MG/ML injection 4 mg (4 mg Intravenous Given 03/19/24 1152)  ondansetron  (ZOFRAN ) injection 4 mg (4 mg Intravenous Given 03/19/24 1151)  iohexol  (OMNIPAQUE ) 300 MG/ML solution 100 mL (100 mLs Intravenous Contrast Given 03/19/24 1309)     The patient appears reasonably screen and/or  stabilized for discharge and I doubt any other medical condition or other [facility] requiring further screening, evaluation, or treatment in the ED at this time prior to discharge.          Final Clinical Impression(s) / ED Diagnoses Final diagnoses:  Balanitis    Rx / DC Orders ED Discharge Orders          Ordered    fluconazole  (DIFLUCAN ) 150 MG tablet  Daily,   Status:  Discontinued        03/19/24 1433    ketoconazole  (NIZORAL ) 2 % cream  Daily,   Status:  Discontinued        03/19/24 1433    cephALEXin  (KEFLEX ) 500 MG capsule  4 times daily,   Status:  Discontinued        03/19/24 1433    morphine  (MSIR) 15 MG tablet  Every 4 hours PRN        03/19/24 1440    cephALEXin  (KEFLEX ) 500 MG capsule  4 times daily        03/19/24 1440    fluconazole  (DIFLUCAN ) 150 MG tablet  Daily        03/19/24 1440    ketoconazole  (NIZORAL ) 2 % cream  Daily        03/19/24 1440              Ariah Mower, DO 03/19/24 1500

## 2024-03-19 NOTE — Discharge Instructions (Addendum)
 Please follow-up with your urologist in the office.

## 2024-03-19 NOTE — ED Provider Notes (Signed)
 [facility]    CSN: [id number] Arrival date & time: 03/19/24  1024      History   Chief Complaint Chief Complaint  Patient presents with   Groin Pain    I see dark green. Think I have an infection. Lots of pain - Entered by patient    HPI Ryan Zhang is a 78 y.o. male.   Ryan Zhang is a 78 y.o. male with history of type 2 diabetes, COPD, and stroke presenting for chief complaint of dysuria and penile pain that started a few days ago. He has a history of phimosis, states he has not been able to retract his foreskin in 2 years and has frequent tinea cruris infections. 2 days ago, he noticed new yellow/white drainage from the penile region and burning with urination. He reports green drainage from the penile region associated with increased irritation with urination this morning causing concern for infection. He was treated for yeast dermatitis and secondary bacterial infection with diflucan  and cephalexin  for 5 days 1 month ago starting on February 22, 2024. States antibiotics and antifungals helped with the yellow/white drainage and itching temporarily, then symptoms returned quickly. Most recent HA1C was 6.9 in February 2025, states sugars remain under good control. He has been using Desitin to the penis and scrotum with minimal relief.    Groin Pain    Past Medical History:  Diagnosis Date   Allergy    allergy shots [employer]. [employer]   Anemia    Anxiety    Asthma    moderate persistant   Bipolar affective (HCC)    Cataract    both eyes   Clotting disorder (HCC)    Due to blood thinner   COPD (chronic obstructive pulmonary disease) (HCC)    Depression    Diabetes mellitus    DJD (degenerative joint disease)    Dysphagia    Gallstones    hx of, s/p cholecystectomy   GERD (gastroesophageal reflux disease)    Hepatitis A    as teenager 60's   Hollenhorst plaque    right eye   Hyperlipidemia    Hypertension    Kidney stones    hx of   Meniere's disease     Motility disorder, esophageal    Obesity    Pancreatitis    Personal history of colonic polyps 11/2004   hyperplastic.   Stroke [facility])    per MRI    Patient Active Problem List   Diagnosis Date Noted   Tinea cruris 07/24/2020   PCP NOTES >>>>>>>>>>>>>>>>>>>>>>>>>>>>>>> 01/12/2016   Folliculitis 05/09/2015   Hearing loss in right ear 08/05/2014   Panic attack as reaction to stress 04/20/2012   Dysphagia, neurologic 01/07/2012   Morbid obesity (HCC) 01/07/2012   Chest pain, musculoskeletal 12/07/2011   Candidiasis of skin 08/12/2011   Annual physical exam 06/07/2011   OLECRANON BURSITIS 01/29/2011   PARTIAL ARTERIAL OCCLUSION OF RETINA 05/27/2010   Essential hypertension, benign 01/16/2010   ABNORMAL ELECTROCARDIOGRAM 01/14/2010   Asthma 10/14/2009   GERD 10/14/2009   Bipolar disorder (HCC) 02/20/2009   PHIMOSIS 02/20/2009   KNEE PAIN, CHRONIC 11/20/2008   HIP PAIN, RIGHT 08/19/2008   DM type 2 with diabetic peripheral neuropathy (HCC) 02/22/2007   Hyperlipidemia associated with type 2 diabetes mellitus (HCC) 02/22/2007   ALLERGIC RHINITIS, SEASONAL 02/22/2007   LOW BACK PAIN, CHRONIC 02/22/2007   MENIERE'S DISEASE, HX OF 02/22/2007    Past Surgical History:  Procedure Laterality Date   CATARACT EXTRACTION  Bilateral 09/2015 and 10/2015   CHOLECYSTECTOMY     COLONOSCOPY     EYE SURGERY     JOINT REPLACEMENT     TOTAL KNEE ARTHROPLASTY Bilateral    both knees       Home Medications    Prior to Admission medications   Medication Sig Start Date End Date Taking? Authorizing Provider  albuterol  (PROVENTIL ) (2.5 MG/3ML) 0.083% nebulizer solution TAKE (2.5MG  TOTAL) BY NEBULIZATION EVERY 6 HOURS AS NEEDED FOR WHEEZING OR SHORTNESS OF BREATH 10/15/23   Ezell Hollow, MD  albuterol  (VENTOLIN  HFA) 108 [phone] Base) MCG/ACT inhaler Inhale 2 puffs into the lungs every 6 (six) hours as needed for wheezing or shortness of breath. 02/11/20   Ezell Hollow, MD  asenapine  (SAPHRIS ) 5  MG SUBL 24 hr tablet Place 5 mg under the tongue at bedtime.    [provider]  augmented betamethasone  dipropionate (DIPROLENE -AF) 0.05 % cream Apply topically 2 (two) times daily. 04/19/22   Paz, Jose E, MD  Blood Glucose Monitoring Suppl [facility] VERIO REFLECT) w/Device KIT Check blood sugars 1-2 times daily 10/19/23   Paz, Jose E, MD  budesonide -formoterol (SYMBICORT) 160-4.5 MCG/ACT inhaler Inhale 2 puffs into the lungs 2 (two) times daily.     [provider]  buPROPion (WELLBUTRIN XL) 150 MG 24 hr tablet Take 150 mg by mouth daily. 02/15/21   [provider]  carbidopa -levodopa  (SINEMET  IR) 25-100 MG tablet TAKE ONE TABLET BY MOUTH THREE TIMES DAILY AT 7AM, 11AM, AND 4PM 01/30/24   Tat, Von Grumbling, DO  cholecalciferol (VITAMIN D3) 25 MCG (1000 UNIT) tablet Take 1,000 Units by mouth daily.    [provider]  clonazePAM  (KLONOPIN ) 0.5 MG tablet Take 0.5 mg by mouth daily.    [provider]  clopidogrel  (PLAVIX ) 75 MG tablet Take 1 tablet (75 mg total) by mouth daily. 12/20/23   Ezell Hollow, MD  DULoxetine  (CYMBALTA ) 60 MG capsule Take 60 mg by mouth every morning.    [provider]  fexofenadine (ALLEGRA) 180 MG tablet Take 180 mg by mouth every morning. 8am [name])    [provider]  fluconazole  (DIFLUCAN ) 150 MG tablet Take 1 tablet (150 mg total) by mouth every three (3) days as needed. May repeat in 3 days if symptoms not resolved 02/22/24   Mecum, Erin E, PA-C  fluticasone  (FLONASE ) 50 MCG/ACT nasal spray  09/21/22   [provider]  HYDROcodone -acetaminophen  (NORCO/VICODIN) 5-325 MG tablet Take 1-2 tablets by mouth every 6 (six) hours as needed for severe pain (pain score 7-10). 03/14/24   Harris, Abigail, PA-C  lamoTRIgine (LAMICTAL) 25 MG tablet Take 50 mg by mouth at bedtime.    [provider]  Lancets Emmett Harman ULTRASOFT) lancets Check blood sugars no more than twice daily 12/09/16   Paz, Jose E, MD   losartan  (COZAAR ) 25 MG tablet Take 1 tablet (25 mg total) by mouth every evening. 03/12/24   Paz, Jose E, MD  montelukast (SINGULAIR) 10 MG tablet Take 10 mg by mouth at bedtime. [name]    [provider]  Multiple Vitamin (MULTIVITAMIN) tablet Take 1 tablet by mouth daily. Herbalist, Historical, MD  naproxen  (NAPROSYN ) 500 MG tablet Take 500 mg by mouth daily as needed.    [provider]  nystatin  ointment (MYCOSTATIN ) Apply 1 Application topically 2 (two) times daily. 10/06/23   Rising, Ivette Marks, PA-C  ONETOUCH ULTRA test strip USE TO TEST GLUCOSE LEVELS  2 TIMES DAILY 06/29/23   Ezell Hollow, MD  pantoprazole  (PROTONIX ) 40 MG tablet Take 1 tablet (40 mg total) by mouth daily before breakfast. 01/30/24   Paz, Jose E, MD  pioglitazone -metformin  (ACTOPLUS MET ) 15-850 MG tablet Take 1 tablet by mouth 2 (two) times daily with a meal. 11/22/23   Ezell Hollow, MD  simvastatin  (ZOCOR ) 40 MG tablet TAKE ONE TABLET BY MOUTH DAILY AT BEDTIME 02/13/24   Paz, Jose E, MD  solifenacin  (VESICARE ) 10 MG tablet Take 1 tablet (10 mg total) by mouth daily. 01/10/24   [name], MD    Family History Family History  Problem Relation Age of Onset   Arthritis Mother    Cancer Mother    Diabetes Father    Alcohol abuse Father    Cancer Sister        unknown type   Heart disease Brother    Obesity Brother    Obesity Brother    Heart disease Maternal Grandmother    Alzheimer's disease Paternal Grandmother    Drug abuse Daughter    Colon cancer Neg Hx    Prostate cancer Neg Hx    Colon polyps Neg Hx    Esophageal cancer Neg Hx    Rectal cancer Neg Hx    Stomach cancer Neg Hx     Social History Social History   Tobacco Use   Smoking status: Former    Current packs/day: 0.00    Average packs/day: 1 pack/day for 1 year (1.0 ttl pk-yrs)    Types: Cigarettes    Start date: 11/16/1963    Quit date: 11/15/1964    Years since quitting: 59.3   Smokeless tobacco:  Never   Tobacco comments:    Questions not useful  Vaping Use   Vaping status: Never Used  Substance Use Topics   Alcohol use: Not Currently   Drug use: No     Allergies   Celexa  [citalopram hydrobromide], Depakote  [divalproex sodium], Methocarbamol, Paroxetine hcl, Smz-tmp ds [sulfamethoxazole-trimethoprim], Other, Oxycodone-acetaminophen , and Risperidone   Review of Systems Review of Systems Per HPI  Physical Exam Triage Vital Signs ED Triage Vitals  Encounter Vitals Group     BP 03/19/24 1031 (!) 149/78     Systolic BP Percentile --      Diastolic BP Percentile --      Pulse Rate 03/19/24 1031 83     Resp 03/19/24 1031 19     Temp 03/19/24 1031 (!) 97 F (36.1 C)     Temp Source 03/19/24 1031 Axillary     SpO2 03/19/24 1031 93 %     Weight --      Height --      Head Circumference --      Peak Flow --      Pain Score 03/19/24 1037 10     Pain Loc --      Pain Education --      Exclude from Growth Chart --    No data found.  Updated Vital Signs BP (!) 149/78 (BP Location: Right Arm)   Pulse 83   Temp (!) 97 F (36.1 C) (Axillary)   Resp 19   SpO2 93%   Visual Acuity Right Eye Distance:   Left Eye Distance:   Bilateral Distance:    Right Eye Near:   Left Eye Near:    Bilateral Near:     Physical Exam Vitals and nursing note reviewed. Exam conducted with a chaperone present (  Lane Pinon, RN present for GU exam).  Constitutional:      Appearance: He is not ill-appearing or toxic-appearing.  HENT:     Head: Normocephalic and atraumatic.     Right Ear: Hearing and external ear normal.     Left Ear: Hearing and external ear normal.     Nose: Nose normal.     Mouth/Throat:     Lips: Pink.  Eyes:     General: Lids are normal. Vision grossly intact. Gaze aligned appropriately.     Extraocular Movements: Extraocular movements intact.     Conjunctiva/sclera: Conjunctivae normal.  Pulmonary:     Effort: Pulmonary effort is normal.  Genitourinary:     Penis: Phimosis, tenderness, discharge and swelling present.      Testes: Normal.     Comments: Penis is retracted. Unable to pull back foreskin to visualize the urethral meatus and the glans penis. Significant swelling and tenderness of the foreskin with manipulation. Green and yellow drainage visualized on exam with erythematous and macerated appearing tissue surrounding the foreskin.  Musculoskeletal:     Cervical back: Neck supple.  Skin:    General: Skin is warm and dry.     Capillary Refill: Capillary refill takes less than 2 seconds.     Findings: No rash.  Neurological:     General: No focal deficit present.     Mental Status: He is alert and oriented to person, place, and time. Mental status is at baseline.     Cranial Nerves: No dysarthria or facial asymmetry.  Psychiatric:        Mood and Affect: Mood normal.        Speech: Speech normal.        Behavior: Behavior normal.        Thought Content: Thought content normal.        Judgment: Judgment normal.      UC Treatments / Results  Labs (all labs ordered are listed, but only abnormal results are displayed) Labs Reviewed - No data to display  EKG   Radiology No results found.  Procedures Procedures (including critical care time)  Medications Ordered in UC Medications - No data to display  Initial Impression / Assessment and Plan / UC Course  I have reviewed the triage vital signs and the nursing notes.  Pertinent labs & imaging results that were available during my care of the patient were reviewed by me and considered in my medical decision making (see chart for details).   1. Acute infective balanitis, phimosis of penis Significant tenderness on exam with attempt to retract foreskin, signs of tinea cruris with secondary bacterial infection. Previous provider was able to retract the foreskin and visualize penile lesions on assessment, I'm unable to do this today and concerned for significant secondary  bacterial infection to the penis.  Recommend further workup and evaluation in the emergency department to rule out urologic emergency and for stat blood work.   Discussed clinical concerns/exam findings leading to recommendation for further workup in the ER setting and risks of deferring ER visit with patient/family. Patient/family express understanding and agreement with plan, discharged to ER via private car with wife.   Final Clinical Impressions(s) / UC Diagnoses   Final diagnoses:  Acute infective balanitis  Phimosis of penis   Discharge Instructions   None    ED Prescriptions   None    PDMP not reviewed this encounter.   Starlene Eaton, [place] 03/19/24 1112

## 2024-03-19 NOTE — ED Triage Notes (Signed)
 Pt POV in wheelchair- c/o penile infection since Thursday, c/o urinary discomfort.   Last taken tylenol  appx 1100 today.

## 2024-03-22 ENCOUNTER — Telehealth: Payer: Self-pay | Admitting: Internal Medicine

## 2024-03-22 NOTE — Telephone Encounter (Signed)
 Copied from CRM [id number]. Topic: Medicare AWV >> Mar 22, 2024  9:51 AM Juliana Ocean wrote: Reason for CRM: LVM 03/22/2024 to schedule AWV. Please schedule Virtual or Telehealth visits ONLY.   Rosalee Collins; Care Guide Ambulatory Clinical Support [place] [facility] Direct Dial: [phone]

## 2024-04-04 DIAGNOSIS — J301 Allergic rhinitis due to pollen: Secondary | ICD-10-CM | POA: Diagnosis not present

## 2024-04-04 DIAGNOSIS — J3081 Allergic rhinitis due to animal (cat) (dog) hair and dander: Secondary | ICD-10-CM | POA: Diagnosis not present

## 2024-04-04 DIAGNOSIS — J3089 Other allergic rhinitis: Secondary | ICD-10-CM | POA: Diagnosis not present

## 2024-04-07 ENCOUNTER — Encounter: Payer: Self-pay | Admitting: Internal Medicine

## 2024-04-11 ENCOUNTER — Other Ambulatory Visit: Payer: Self-pay | Admitting: Internal Medicine

## 2024-04-11 DIAGNOSIS — J301 Allergic rhinitis due to pollen: Secondary | ICD-10-CM | POA: Diagnosis not present

## 2024-04-11 DIAGNOSIS — J3089 Other allergic rhinitis: Secondary | ICD-10-CM | POA: Diagnosis not present

## 2024-04-11 DIAGNOSIS — J3081 Allergic rhinitis due to animal (cat) (dog) hair and dander: Secondary | ICD-10-CM | POA: Diagnosis not present

## 2024-04-11 MED ORDER — NYSTATIN 100000 UNIT/GM EX POWD: 1.0000 | Freq: Three times a day (TID) | CUTANEOUS | 1 refills | Status: DC

## 2024-04-11 MED ORDER — NYSTATIN 100000 UNIT/GM EX OINT: 1.0000 | TOPICAL_OINTMENT | Freq: Two times a day (BID) | CUTANEOUS | 1 refills | Status: DC

## 2024-04-18 DIAGNOSIS — J3089 Other allergic rhinitis: Secondary | ICD-10-CM | POA: Diagnosis not present

## 2024-04-18 DIAGNOSIS — J301 Allergic rhinitis due to pollen: Secondary | ICD-10-CM | POA: Diagnosis not present

## 2024-04-20 ENCOUNTER — Encounter: Payer: Self-pay | Admitting: Urology

## 2024-04-20 ENCOUNTER — Ambulatory Visit: Admitting: Urology

## 2024-04-20 VITALS — BP 153/79 | HR 104 | Ht 70.0 in | Wt 250.0 lb

## 2024-04-20 DIAGNOSIS — N35911 Unspecified urethral stricture, male, meatal: Secondary | ICD-10-CM | POA: Diagnosis not present

## 2024-04-20 DIAGNOSIS — Q5564 Hidden penis: Secondary | ICD-10-CM

## 2024-04-20 DIAGNOSIS — N401 Enlarged prostate with lower urinary tract symptoms: Secondary | ICD-10-CM

## 2024-04-20 LAB — URINALYSIS, ROUTINE W REFLEX MICROSCOPIC
Bilirubin, UA: NEGATIVE
Glucose, UA: NEGATIVE
Nitrite, UA: NEGATIVE
RBC, UA: NEGATIVE
Specific Gravity, UA: >1.03 — ABNORMAL HIGH (ref 1.005–1.030)
Urobilinogen, Ur: 4 mg/dL — ABNORMAL HIGH (ref 0.2–1.0)
pH, UA: 5.5 (ref 5.0–7.5)

## 2024-04-20 LAB — MICROSCOPIC EXAMINATION

## 2024-04-20 MED ORDER — CLOTRIMAZOLE-BETAMETHASONE 1-0.05 % EX CREA: 1.0000 | TOPICAL_CREAM | Freq: Two times a day (BID) | CUTANEOUS | 3 refills | Status: DC

## 2024-04-20 NOTE — Progress Notes (Signed)
 Assessment: 1. Hidden penis   2. 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 Benign localized prostatic hyperplasia with lower urinary tract symptoms (LUTS)     Plan: I personally reviewed the patient's chart including provider notes, labs. He has a hidden penis with significant chronic scarring.  I do not think that a dorsal slit or circumcision revision would be appropriate.  He would likely need more significant surgical management.  I discussed a referral to a reconstructive urologist to discuss management options. Trial of Lotrisone  ointment twice daily. Continue solifenacin  Return to office in 2 months.  Chief Complaint: Chief Complaint  Patient presents with   Hidden Penis    HPI: Ryan Zhang is a 78 y.o. male who presents for continued evaluation of hidden penis, BXO with meatal stenosis, BPH with LUTS. He was followed for a number of years by [facility] urology.  He was seen by Dr. Del Favia in February 2020 for for evaluation of his lower urinary tract symptoms.  He has a long history of BPH with lower urinary tract symptoms with prominent urgency.  He previously took tamsulosin  and alfuzosin without benefit.  He was taking Vesicare  10 mg daily with some improvement.  At his last visit in February 2025, his lower urinary tract symptoms were fairly stable with the solifenacin . IPSS = 15/3.  He has a long history of BXO with meatal stenosis.  He is status post a circumcision with repeat circumcision and urethral dilation in 2010.  Attempted office cystoscopy in June 2024 was unsuccessful due to his phimosis and BXO. He was seen in the emergency room in May 2025 for dysuria and penile pain.  He was treated for balanitis with oral fluconazole .  PSA 2/24: 0.20  Urine culture from 5/24 grew >100 K Klebsiella.  He presents today for evaluation of his hidden penis.  He has had recent problems with balanitis.  He is unable to retract his foreskin.  He voids  sitting down and has a slow stream.  He is not having any current dysuria.  No gross hematuria.  He continues on solifenacin  with stable lower urinary tract symptoms.  Portions of the above documentation were copied from a prior visit for review purposes only.  Allergies: Allergies  Allergen Reactions   Celexa  [Citalopram Hydrobromide] Other (See Comments)   Depakote  [Divalproex Sodium] Other (See Comments)   Methocarbamol     Other reaction(s): Hallucinations   Paroxetine Hcl     Other reaction(s): Insomnia   Smz-Tmp Ds [Sulfamethoxazole-Trimethoprim] Nausea And Vomiting   Other     "Symbrax" alteration in blood sugar, pt unsure if low or high blood sugar   Oxycodone-Acetaminophen      Other reaction(s): Unknown   Risperidone Other (See Comments)    REACTION: hyper    PMH: Past Medical History:  Diagnosis Date   Allergy    allergy shots Dr[employer]   Anemia    Anxiety    Asthma    moderate persistant   Bipolar affective (HCC)    Cataract    both eyes   Clotting disorder (HCC)    Due to blood thinner   COPD (chronic obstructive pulmonary disease) (HCC)    Depression    Diabetes mellitus    DJD (degenerative joint disease)    Dysphagia    Gallstones    hx of, s/p cholecystectomy   GERD (gastroesophageal reflux disease)    Hepatitis A    as teenager 60's  Hollenhorst plaque    right eye   Hyperlipidemia    Hypertension    Kidney stones    hx of   Meniere's disease    Motility disorder, esophageal    Obesity    Pancreatitis    Personal history of colonic polyps 11/2004   hyperplastic.   Stroke [facility])    per MRI    PSH: Past Surgical History:  Procedure Laterality Date   CATARACT EXTRACTION Bilateral 09/2015 and 10/2015   CHOLECYSTECTOMY     COLONOSCOPY     EYE SURGERY     JOINT REPLACEMENT     TOTAL KNEE ARTHROPLASTY Bilateral    both knees    SH: Social History   Tobacco Use   Smoking status: Former    Current packs/day: 0.00    Average  packs/day: 1 pack/day for 1 year (1.0 ttl pk-yrs)    Types: Cigarettes    Start date: 11/16/1963    Quit date: 11/15/1964    Years since quitting: 59.4   Smokeless tobacco: Never   Tobacco comments:    Questions not useful  Vaping Use   Vaping status: Never Used  Substance Use Topics   Alcohol use: Not Currently   Drug use: No    ROS: Constitutional:  Negative for fever, chills, weight loss CV: Negative for chest pain, previous MI, hypertension Respiratory:  Negative for shortness of breath, wheezing, sleep apnea, frequent cough GI:  Negative for nausea, vomiting, bloody stool, GERD  PE: BP (!) 153/79   Pulse (!) 104   Ht 5\' 10"  (1.778 m)   Wt 250 lb (113.4 kg)   BMI 35.87 kg/m  GENERAL APPEARANCE:  Well appearing, well developed, well nourished, NAD HEENT:  Atraumatic, normocephalic, oropharynx clear NECK:  Supple without lymphadenopathy or thyromegaly ABDOMEN:  Soft, non-tender, no masses EXTREMITIES:  Moves all extremities well, without clubbing, cyanosis, or edema NEUROLOGIC:  Alert and oriented x 3, normal gait, CN II-XII grossly intact MENTAL STATUS:  appropriate BACK:  Non-tender to palpation, No CVAT SKIN:  Warm, dry, and intact GU: Penis:  retracted; unable to visualize glans due to severe scarring of overlying skin; chronic inflammatory changes of skin; no obvious infection Meatus: unable to visualize Scrotum: normal, no masses Testis: normal without masses bilateral   Results: U/A: 6/10 WBCs, 0-2 RBCs

## 2024-04-25 DIAGNOSIS — J3081 Allergic rhinitis due to animal (cat) (dog) hair and dander: Secondary | ICD-10-CM | POA: Diagnosis not present

## 2024-04-25 DIAGNOSIS — J301 Allergic rhinitis due to pollen: Secondary | ICD-10-CM | POA: Diagnosis not present

## 2024-04-25 DIAGNOSIS — J3089 Other allergic rhinitis: Secondary | ICD-10-CM | POA: Diagnosis not present

## 2024-05-02 ENCOUNTER — Encounter: Payer: Self-pay | Admitting: Urology

## 2024-05-02 ENCOUNTER — Other Ambulatory Visit: Payer: Self-pay | Admitting: Urology

## 2024-05-02 DIAGNOSIS — J3089 Other allergic rhinitis: Secondary | ICD-10-CM | POA: Diagnosis not present

## 2024-05-02 DIAGNOSIS — J301 Allergic rhinitis due to pollen: Secondary | ICD-10-CM | POA: Diagnosis not present

## 2024-05-02 DIAGNOSIS — J3081 Allergic rhinitis due to animal (cat) (dog) hair and dander: Secondary | ICD-10-CM | POA: Diagnosis not present

## 2024-05-02 MED ORDER — SOLIFENACIN SUCCINATE 10 MG PO TABS: 10.0000 mg | ORAL_TABLET | Freq: Every day | ORAL | 3 refills | Status: DC

## 2024-05-09 ENCOUNTER — Encounter: Payer: Self-pay | Admitting: Internal Medicine

## 2024-05-09 DIAGNOSIS — J3081 Allergic rhinitis due to animal (cat) (dog) hair and dander: Secondary | ICD-10-CM | POA: Diagnosis not present

## 2024-05-09 DIAGNOSIS — J3089 Other allergic rhinitis: Secondary | ICD-10-CM | POA: Diagnosis not present

## 2024-05-09 DIAGNOSIS — J301 Allergic rhinitis due to pollen: Secondary | ICD-10-CM | POA: Diagnosis not present

## 2024-05-12 ENCOUNTER — Other Ambulatory Visit: Payer: Self-pay | Admitting: Neurology

## 2024-05-12 DIAGNOSIS — T43505A Adverse effect of unspecified antipsychotics and neuroleptics, initial encounter: Secondary | ICD-10-CM

## 2024-05-12 DIAGNOSIS — G2401 Drug induced subacute dyskinesia: Secondary | ICD-10-CM

## 2024-05-14 ENCOUNTER — Ambulatory Visit: Payer: Medicare Other | Admitting: Internal Medicine

## 2024-05-14 ENCOUNTER — Encounter: Payer: Self-pay | Admitting: Internal Medicine

## 2024-05-14 VITALS — BP 128/86 | HR 89 | Temp 98.0°F | Resp 18 | Ht 70.0 in | Wt 222.5 lb

## 2024-05-14 DIAGNOSIS — G2111 Neuroleptic induced parkinsonism: Secondary | ICD-10-CM | POA: Diagnosis not present

## 2024-05-14 DIAGNOSIS — Z0000 Encounter for general adult medical examination without abnormal findings: Secondary | ICD-10-CM | POA: Diagnosis not present

## 2024-05-14 DIAGNOSIS — G2401 Drug induced subacute dyskinesia: Secondary | ICD-10-CM

## 2024-05-14 DIAGNOSIS — T43505A Adverse effect of unspecified antipsychotics and neuroleptics, initial encounter: Secondary | ICD-10-CM

## 2024-05-14 DIAGNOSIS — I10 Essential (primary) hypertension: Secondary | ICD-10-CM | POA: Diagnosis not present

## 2024-05-14 DIAGNOSIS — Z0001 Encounter for general adult medical examination with abnormal findings: Secondary | ICD-10-CM | POA: Diagnosis not present

## 2024-05-14 DIAGNOSIS — E1142 Type 2 diabetes mellitus with diabetic polyneuropathy: Secondary | ICD-10-CM

## 2024-05-14 MED ORDER — CARBIDOPA-LEVODOPA 25-100 MG PO TABS: 1.0000 | ORAL_TABLET | Freq: Three times a day (TID) | ORAL | 0 refills | Status: DC

## 2024-05-14 MED ORDER — NYSTATIN 100000 UNIT/GM EX POWD: 1.0000 | Freq: Three times a day (TID) | CUTANEOUS | Status: DC

## 2024-05-14 NOTE — Assessment & Plan Note (Signed)
 Here for CPX -Td 2018 - pnm shot 2015; prevnar 2016; PNM 20: 03/15/2022 - zostavax 2013; s/p shingrex x 2 per pt; s/p RSV - Rec: flu shot q fall; COVID VAX from 07/2023  -CCS: Normal  Cscope 2011; cscope 09/2018: neg, no polyp, done for anemia  -Prostate ca screening:  Sees urology   - Patient education: Diet-exercise - feet care  -lab: BMP, CBC, A1c - ACP information on file

## 2024-05-14 NOTE — Assessment & Plan Note (Signed)
 Here for CPX Other issues: DM: Currently on ActosPlusmet, last A1c satisfactory, recheck A1c HTN: BP today is 128/86, continue losartan , check BMP. High cholesterol: Controlled on simvastatin  Bipolar, depression: Felt poorly last month, see HPI, currently with no suicidal ideas. Parkinson's: Forgot to get carbidopa -levodopa , requested short-term refill, sent. MSK: See patient's message, pain pain not helped by OTCs, for now I recommend to stick with Tylenol .  Frequent falls, gait disorder: Patient reported he is afraid of falling, previously seen at [facility], they recommended to continue PT in the home setting thus I offered referral however he declines, reports that frequent falls have significantly decreased Dermatitis: Seen w/  a groin rash in April, then was seen with balanoposthitis at the ER 03/19/2024, CT of the pelvis with no deep infection, urology got involved, was Rx Diflucan , Nizoral , Keflex .  Subsequently saw urology, all better. RTC 4 months

## 2024-05-14 NOTE — Progress Notes (Signed)
 Subjective:    Patient ID: Ryan Zhang, male    DOB: [birth year], 78 y.o.   MRN: [id number]  DOS:  05/14/2024 Type of visit - description: Here for CPX  Chronic medical problems addressed. In the last few weeks was feeling pretty poorly, mostly because his stamina is not good, he also went to urgent care and the emergency room c/o a groin and genital rash. At some point got more depressed than usual and had suicidal ideas. Fortunately no further suicidal ideas. The genital rash is improved  Denies chest pain or difficulty breathing No edema No nausea vomiting.  No blood in the stools. Had a single episode of vertigo reminiscent to his history of Mnire's.  MSK: See patient's message, despite Tylenol , Advil, Aleve  he still have some pains. Muscle strength is poor. He is afraid of falling. The patient states he has the feeling ambulatory PT, benchmark, does not like to see him again.  Review of Systems  Other than above, a 14 point review of systems is negative     Past Medical History:  Diagnosis Date   Allergy    allergy shots Dr. Cloretta   Anemia    Anxiety    Asthma    moderate persistant   Bipolar affective (HCC)    Cataract    both eyes   Clotting disorder (HCC)    Due to blood thinner   COPD (chronic obstructive pulmonary disease) (HCC)    Depression    Diabetes mellitus    DJD (degenerative joint disease)    Dysphagia    Gallstones    hx of, s/p cholecystectomy   GERD (gastroesophageal reflux disease)    Hepatitis A    as teenager 60's   Hollenhorst plaque    right eye   Hyperlipidemia    Hypertension    Kidney stones    hx of   Meniere's disease    Motility disorder, esophageal    Obesity    Pancreatitis    Personal history of colonic polyps 11/2004   hyperplastic.   Stroke [facility])    per MRI    Past Surgical History:  Procedure Laterality Date   CATARACT EXTRACTION Bilateral 09/2015 and 10/2015   CHOLECYSTECTOMY     COLONOSCOPY     EYE  SURGERY     JOINT REPLACEMENT     TOTAL KNEE ARTHROPLASTY Bilateral    both knees   Social History   Social History Narrative   - Household-- pt , wife   - adopted  daughter (h/o substance abuse,on a methadone program, doing better )   - disability d/t ECT (2007) - was left w/ cognitive problems    Right handed     Current Outpatient Medications  Medication Instructions   albuterol  (PROVENTIL ) (2.5 MG/3ML) 0.083% nebulizer solution TAKE (2.5MG  TOTAL) BY NEBULIZATION EVERY 6 HOURS AS NEEDED FOR WHEEZING OR SHORTNESS OF BREATH   albuterol  (VENTOLIN  HFA) 108 (90 Base) MCG/ACT inhaler 2 puffs, Inhalation, Every 6 hours PRN   asenapine  (SAPHRIS ) 5 mg, Daily at bedtime   augmented betamethasone  dipropionate (DIPROLENE -AF) 0.05 % cream Topical, 2 times daily   Blood Glucose Monitoring Suppl (ONETOUCH VERIO REFLECT) w/Device KIT Check blood sugars 1-2 times daily   budesonide -formoterol (SYMBICORT) 160-4.5 MCG/ACT inhaler 2 puffs, 2 times daily   buPROPion (WELLBUTRIN XL) 150 mg, Daily   carbidopa -levodopa  (SINEMET  IR) 25-100 MG tablet 1 tablet, Oral, 3 times daily   cholecalciferol (VITAMIN D3) 1,000 Units, Daily   clonazePAM  (  KLONOPIN ) 0.5 mg, Daily   clopidogrel  (PLAVIX ) 75 mg, Oral, Daily   clotrimazole -betamethasone  (LOTRISONE ) cream 1 Application, Topical, 2 times daily   DULoxetine  (CYMBALTA ) 60 mg, BH-each morning   fexofenadine (ALLEGRA) 180 mg, Every morning   fluticasone  (FLONASE ) 50 MCG/ACT nasal spray    lamoTRIgine (LAMICTAL) 50 mg, Daily at bedtime   Lancets (ONETOUCH ULTRASOFT) lancets Check blood sugars no more than twice daily   losartan  (COZAAR ) 25 mg, Oral, Every evening   montelukast (SINGULAIR) 10 mg, Daily at bedtime   Multiple Vitamin (MULTIVITAMIN) tablet 1 tablet, Daily   naproxen  (NAPROSYN ) 500 mg, Daily PRN   nystatin  (MYCOSTATIN /NYSTOP ) powder 1 Application, Topical, 3 times daily   ONETOUCH ULTRA test strip USE TO TEST GLUCOSE LEVELS 2 TIMES DAILY    pantoprazole  (PROTONIX ) 40 mg, Oral, Daily before breakfast   pioglitazone -metformin  (ACTOPLUS MET ) 15-850 MG tablet 1 tablet, Oral, 2 times daily with meals   simvastatin  (ZOCOR ) 40 mg, Oral, Daily at bedtime   solifenacin  (VESICARE ) 10 mg, Oral, Daily       Objective:   Physical Exam BP 128/86   Pulse 89   Temp 98 F (36.7 C) (Oral)   Resp 18   Ht 5' 10 (1.778 m)   Wt 222 lb 8 oz (100.9 kg)   SpO2 96%   BMI 31.93 kg/m  General:   Well developed, NAD, BMI noted. HEENT:  Normocephalic . Face symmetric, atraumatic Lungs:  CTA B Normal respiratory effort, no intercostal retractions, no accessory muscle use. Heart: RRR,  no murmur.  Lower extremities: no pretibial edema bilaterally  Skin: Not pale. Not jaundice Neurologic:  alert & oriented X3.  Speech normal, gait assisted by a walker. Psych--  Cognition and judgment appear intact.  Cooperative with normal attention span and concentration.  Behavior appropriate. No anxious or depressed appearing.      Assessment     Assessment DM, mild neuropathy HTN Hyperlipidemia Morbid obesity  Neuro, psych: ---Bipolar, Depression,  (on disability) per psych, Dr Tasia ---  Parkinson's, likely triggered by Sapharisris --- Tardive dyskinesia (see  neuro OV note  (12/2023) --? stroke : saw neuro 2013 d/t B transient visual loss, ASA changed to plavix .  DJD-- hydrocodone  rarely rx by [place] ortho Asthma-Allergies ------------ Dr Fleeta Smock  GERD, h/o dysphagia (chronic, neurogenic-transfer dysphagia? See GI note 12-2011) CV: Enlarged ascending Ao , + Ao atherosclerosis  --per CT 12-2015,CT chest 08-2016 stable.    --MRI chest 10/2017 stable, no routine f/u Nl LE arterial dopplers 2015 Carotid artery disease: Per US   2011,   US  2018: <50% B,   08/2019, 2022, 02/03/2024: Stable at < 1-39% B  CT chest  ---RML  8mm: 07-2014, CT 12-2015, CT 08-2016, MRI chest 12,2018, CT chest 07/2018:Stable  HOH: L deaf L, R  hearing aid H/o Mnire's  disease  Iron deficiency anemia: Noted 01/2018, + Hemoccult, 09-2018: Colonoscopy and EGD were completely normal  Urology- on Vesicare , sees urology  PLAN: Here for CPX -Td 2018 - pnm shot 2015; prevnar 2016; PNM 20: 03/15/2022 - zostavax 2013; s/p shingrex x 2 per pt; s/p RSV - Rec: flu shot q fall; COVID VAX from 07/2023  -CCS: Normal  Cscope 2011; cscope 09/2018: neg, no polyp, done for anemia  -Prostate ca screening:  Sees urology   - Patient education: Diet-exercise - feet care  -lab: BMP, CBC, A1c - ACP information on file  Other issues: DM: Currently on ActosPlusmet, last A1c satisfactory, recheck A1c HTN: BP today is 128/86,  continue losartan , check BMP. High cholesterol: Controlled on simvastatin  Bipolar, depression: Felt poorly last month, see HPI, currently with no suicidal ideas. Parkinson's: Forgot to get carbidopa -levodopa , requested short-term refill, sent. MSK: See patient's message, pain pain not helped by OTCs, for now I recommend to stick with Tylenol .  Frequent falls, gait disorder: Patient reported he is afraid of falling, previously seen at [facility], they recommended to continue PT in the home setting thus I offered referral however he declines, reports that frequent falls have significantly decreased Dermatitis: Seen w/  a groin rash in April, then was seen with balanoposthitis at the ER 03/19/2024, CT of the pelvis with no deep infection, urology got involved, was Rx Diflucan , Nizoral , Keflex .  Subsequently saw urology, all better. RTC 4 months

## 2024-05-14 NOTE — Patient Instructions (Signed)
 Tylenol   500 mg OTC 2 tabs a day every 8 hours as needed for pain  Will send home physical therapy  GO TO THE LAB :  Get the blood work   Your results will be posted on MyChart with my comments  Next office visit for a checkup in 4 months Please make an appointment before you leave today

## 2024-05-15 ENCOUNTER — Ambulatory Visit (INDEPENDENT_AMBULATORY_CARE_PROVIDER_SITE_OTHER)

## 2024-05-15 VITALS — Ht 70.0 in | Wt 222.0 lb

## 2024-05-15 DIAGNOSIS — Z0000 Encounter for general adult medical examination without abnormal findings: Secondary | ICD-10-CM | POA: Diagnosis not present

## 2024-05-15 LAB — CBC WITH DIFFERENTIAL/PLATELET
Basophils Absolute: 0.1 10*3/uL (ref 0.0–0.1)
Basophils Relative: 0.6 % (ref 0.0–3.0)
Eosinophils Absolute: 0.3 10*3/uL (ref 0.0–0.7)
Eosinophils Relative: 2.5 % (ref 0.0–5.0)
HCT: 36.1 % — ABNORMAL LOW (ref 39.0–52.0)
Hemoglobin: 11 g/dL — ABNORMAL LOW (ref 13.0–17.0)
Lymphocytes Relative: 22.7 % (ref 12.0–46.0)
Lymphs Abs: 2.4 10*3/uL (ref 0.7–4.0)
MCHC: 30.3 g/dL (ref 30.0–36.0)
MCV: 79.5 fl (ref 78.0–100.0)
Monocytes Absolute: 0.6 10*3/uL (ref 0.1–1.0)
Monocytes Relative: 5.5 % (ref 3.0–12.0)
Neutro Abs: 7.3 10*3/uL (ref 1.4–7.7)
Neutrophils Relative %: 68.7 % (ref 43.0–77.0)
Platelets: 406 10*3/uL — ABNORMAL HIGH (ref 150.0–400.0)
RBC: 4.54 Mil/uL (ref 4.22–5.81)
RDW: 18.1 % — ABNORMAL HIGH (ref 11.5–15.5)
WBC: 10.7 10*3/uL — ABNORMAL HIGH (ref 4.0–10.5)

## 2024-05-15 LAB — MICROALBUMIN / CREATININE URINE RATIO
Creatinine,U: 222.6 mg/dL
Microalb Creat Ratio: 67.5 mg/g — ABNORMAL HIGH (ref 0.0–30.0)
Microalb, Ur: 15 mg/dL — ABNORMAL HIGH (ref 0.0–1.9)

## 2024-05-15 LAB — BASIC METABOLIC PANEL WITH GFR
BUN: 17 mg/dL (ref 6–23)
CO2: 23 meq/L (ref 19–32)
Calcium: 9.1 mg/dL (ref 8.4–10.5)
Chloride: 105 meq/L (ref 96–112)
Creatinine, Ser: 0.8 mg/dL (ref 0.40–1.50)
GFR: 86.3 mL/min (ref >60.00)
Glucose, Bld: 168 mg/dL — ABNORMAL HIGH (ref 70–99)
Potassium: 4 meq/L (ref 3.5–5.1)
Sodium: 141 meq/L (ref 135–145)

## 2024-05-15 LAB — HEMOGLOBIN A1C: Hgb A1c MFr Bld: 7.3 % — ABNORMAL HIGH (ref 4.6–6.5)

## 2024-05-15 NOTE — Progress Notes (Signed)
 Subjective:   Ryan Zhang is a 78 y.o. who presents for a Medicare Wellness preventive visit.  As a reminder, Annual Wellness Visits don't include a physical exam, and some assessments may be limited, especially if this visit is performed virtually. We may recommend an in-person follow-up visit with your provider if needed.  Visit Complete: Virtual I connected with  Draeden K Ashmead on 05/15/24 by a audio enabled telemedicine application and verified that I am speaking with the correct person using two identifiers.  Patient Location: Home  Provider Location: Home Office  I discussed the limitations of evaluation and management by telemedicine. The patient expressed understanding and agreed to proceed.  Vital Signs: Because this visit was a virtual/telehealth visit, some criteria may be missing or patient reported. Any vitals not documented were not able to be obtained and vitals that have been documented are patient reported.  VideoDeclined- This patient declined Librarian, academic. Therefore the visit was completed with audio only.  Persons Participating in Visit: Patient.  AWV Questionnaire: Yes: Patient Medicare AWV questionnaire was completed by the patient on 05/08/24; I have confirmed that all information answered by patient is correct and no changes since this date.  Cardiac Risk Factors include: advanced age (>17men, >48 women);diabetes mellitus;male gender;hypertension;dyslipidemia;sedentary lifestyle     Objective:    Today's Vitals   05/15/24 1417  Weight: 222 lb (100.7 kg)  Height: 5' 10 (1.778 m)   Body mass index is 31.85 kg/m.     05/15/2024    2:22 PM 03/19/2024   11:24 AM 03/14/2024    5:04 PM 01/03/2024    1:01 PM 07/14/2023    1:40 PM 05/13/2023    3:45 PM 05/06/2023   11:09 AM  Advanced Directives  Does Patient Have a Medical Advance Directive? Yes Yes Yes No Yes Yes Yes  Type of Estate agent of  [place];Living will Healthcare Power of [place];Living will Healthcare Power of [place];Living will  Living will Healthcare Power of [place];Living will Healthcare Power of [place];Living will;Out of facility DNR (pink MOST or yellow form)  Does patient want to make changes to medical advance directive? No - Patient declined  No - Patient declined   No - Patient declined No - Patient declined  Copy of Healthcare Power of Attorney in Chart? Yes - validated most recent copy scanned in chart (See row information)  No - copy requested   Yes - validated most recent copy scanned in chart (See row information) No - copy requested  Would patient like information on creating a medical advance directive?       No - Guardian declined    Current Medications (verified) Outpatient Encounter Medications as of 05/15/2024  Medication Sig   albuterol  (PROVENTIL ) (2.5 MG/3ML) 0.083% nebulizer solution TAKE 3MLS (2.5MG  TOTAL) BY NEBULIZATION EVERY 6 HOURS AS NEEDED FOR WHEEZING OR SHORTNESS OF BREATH   albuterol  (VENTOLIN  HFA) 108 (90 Base) MCG/ACT inhaler Inhale 2 puffs into the lungs every 6 (six) hours as needed for wheezing or shortness of breath.   asenapine  (SAPHRIS ) 5 MG SUBL 24 hr tablet Place 5 mg under the tongue at bedtime.   augmented betamethasone  dipropionate (DIPROLENE -AF) 0.05 % cream Apply topically 2 (two) times daily.   Blood Glucose Monitoring Suppl (ONETOUCH VERIO REFLECT) w/Device KIT Check blood sugars 1-2 times daily   budesonide -formoterol (SYMBICORT) 160-4.5 MCG/ACT inhaler Inhale 2 puffs into the lungs 2 (two) times daily.    buPROPion (WELLBUTRIN XL) 150 MG 24  hr tablet Take 150 mg by mouth daily.   carbidopa -levodopa  (SINEMET  IR) 25-100 MG tablet Take 1 tablet by mouth 3 (three) times daily.   cholecalciferol (VITAMIN D3) 25 MCG (1000 UNIT) tablet Take 1,000 Units by mouth daily.   clonazePAM  (KLONOPIN ) 0.5 MG tablet Take 0.5 mg by mouth daily.   clopidogrel  (PLAVIX ) 75 MG tablet Take  1 tablet (75 mg total) by mouth daily.   clotrimazole -betamethasone  (LOTRISONE ) cream Apply 1 Application topically 2 (two) times daily.   DULoxetine  (CYMBALTA ) 60 MG capsule Take 60 mg by mouth every morning.   fexofenadine (ALLEGRA) 180 MG tablet Take 180 mg by mouth every morning. 8am Marlo Smock)   fluticasone  (FLONASE ) 50 MCG/ACT nasal spray    lamoTRIgine (LAMICTAL) 25 MG tablet Take 50 mg by mouth at bedtime.   Lancets (ONETOUCH ULTRASOFT) lancets Check blood sugars no more than twice daily   losartan  (COZAAR ) 25 MG tablet Take 1 tablet (25 mg total) by mouth every evening.   montelukast (SINGULAIR) 10 MG tablet Take 10 mg by mouth at bedtime. [place]   Multiple Vitamin (MULTIVITAMIN) tablet Take 1 tablet by mouth daily. [employer]ravite senior   naproxen  (NAPROSYN ) 500 MG tablet Take 500 mg by mouth daily as needed.   nystatin  (MYCOSTATIN /NYSTOP ) powder Apply 1 Application topically 3 (three) times daily.   ONETOUCH ULTRA test strip USE TO TEST GLUCOSE LEVELS 2 TIMES DAILY   pantoprazole  (PROTONIX ) 40 MG tablet Take 1 tablet (40 mg total) by mouth daily before breakfast.   pioglitazone -metformin  (ACTOPLUS MET ) 15-850 MG tablet Take 1 tablet by mouth 2 (two) times daily with a meal.   simvastatin  (ZOCOR ) 40 MG tablet TAKE ONE TABLET BY MOUTH DAILY AT BEDTIME   solifenacin  (VESICARE ) 10 MG tablet Take 1 tablet (10 mg total) by mouth daily.   No facility-administered encounter medications on file as of 05/15/2024.    Allergies (verified) Celexa  [citalopram hydrobromide], Depakote  [divalproex sodium], Methocarbamol, Paroxetine hcl, Smz-tmp ds [sulfamethoxazole-trimethoprim], Other, Oxycodone-acetaminophen , and Risperidone   History: Past Medical History:  Diagnosis Date   Allergy    allergy shots Dr. Cloretta   Anemia    Anxiety    Asthma    moderate persistant   Bipolar affective (HCC)    Cataract    both eyes   Clotting disorder (HCC)    Due to blood thinner   COPD (chronic  obstructive pulmonary disease) (HCC)    Depression    Diabetes mellitus    DJD (degenerative joint disease)    Dysphagia    Gallstones    hx of, s/p cholecystectomy   GERD (gastroesophageal reflux disease)    Hepatitis A    as teenager 60's   Hollenhorst plaque    right eye   Hyperlipidemia    Hypertension    Kidney stones    hx of   Meniere's disease    Motility disorder, esophageal    Obesity    Pancreatitis    Personal history of colonic polyps 11/2004   hyperplastic.   Stroke [facility])    per MRI   Past Surgical History:  Procedure Laterality Date   CATARACT EXTRACTION Bilateral 09/2015 and 10/2015   CHOLECYSTECTOMY     COLONOSCOPY     EYE SURGERY     JOINT REPLACEMENT     TOTAL KNEE ARTHROPLASTY Bilateral    both knees   Family History  Problem Relation Age of Onset   Arthritis Mother    Cancer Mother    Diabetes  Father    Alcohol abuse Father    Cancer Sister        unknown type   Heart disease Brother    Obesity Brother    Obesity Brother    Heart disease Maternal Grandmother    Hearing loss Maternal Grandmother    Alzheimer's disease Paternal Grandmother    Drug abuse Daughter    Colon cancer Neg Hx    Prostate cancer Neg Hx    Colon polyps Neg Hx    Esophageal cancer Neg Hx    Rectal cancer Neg Hx    Stomach cancer Neg Hx    Social History   Socioeconomic History   Marital status: Married    Spouse name: Not on file   Number of children: 1   Years of education: 17   Highest education level: Bachelor's degree (e.g., BA, AB, BS)  Occupational History   Occupation: disabled (due to s/e of ECT)    Employer: DISABLED    Comment: laboratory computing/programming  Tobacco Use   Smoking status: Former    Current packs/day: 0.00    Average packs/day: 1 pack/day for 1 year (1.0 ttl pk-yrs)    Types: Cigarettes    Start date: 11/16/1963    Quit date: 11/15/1964    Years since quitting: 59.5   Smokeless tobacco: Never   Tobacco comments:     Questions not useful  Vaping Use   Vaping status: Never Used  Substance and Sexual Activity   Alcohol use: Not Currently   Drug use: No   Sexual activity: Not Currently    Birth control/protection: Other-see comments    Comment: Age  Other Topics Concern   Not on file  Social History Narrative   - Household-- pt , wife   - adopted  daughter (h/o substance abuse,on a methadone program, doing better )   - disability d/t ECT (2007) - was left w/ cognitive problems    Right handed    Social Drivers of Health   Financial Resource Strain: Low Risk  (05/15/2024)   Overall Financial Resource Strain (CARDIA)    Difficulty of Paying Living Expenses: Not very hard  Food Insecurity: No Food Insecurity (05/15/2024)   Hunger Vital Sign    Worried About Running Out of Food in the Last Year: Never true    Ran Out of Food in the Last Year: Never true  Transportation Needs: No Transportation Needs (05/15/2024)   PRAPARE - Administrator, Civil Service (Medical): No    Lack of Transportation (Non-Medical): No  Physical Activity: Patient Declined (05/15/2024)   Exercise Vital Sign    Days of Exercise per Week: Patient declined    Minutes of Exercise per Session: Patient declined  Stress: Stress Concern Present (05/15/2024)   [employer] of Occupational Health - Occupational Stress Questionnaire    Feeling of Stress: Rather much  Social Connections: Socially Isolated (05/15/2024)   Social Connection and Isolation Panel    Frequency of Communication with Friends and Family: Never    Frequency of Social Gatherings with Friends and Family: Never    Attends Religious Services: Never    Database administrator or Organizations: No    Attends Engineer, structural: Never    Marital Status: Married    Tobacco Counseling Counseling given: Not Answered Tobacco comments: Questions not useful    Clinical Intake:  Pre-visit preparation completed: Yes  Pain : No/denies pain      Diabetes: Yes CBG done?:  No Did pt. bring in CBG monitor from home?: No  Lab Results  Component Value Date   HGBA1C 7.3 (H) 05/14/2024   HGBA1C 6.9 (H) 01/10/2024   HGBA1C 7.0 (H) 09/09/2023     How often do you need to have someone help you when you read instructions, pamphlets, or other written materials from your doctor or pharmacy?: 1 - Never  Interpreter Needed?: No  Information entered by :: Charmaine Bloodgood LPN   Activities of Daily Living     05/14/2024    2:01 PM 05/08/2024   10:52 AM  In your present state of health, do you have any difficulty performing the following activities:  Hearing? 0 1  Vision? 0 0  Difficulty concentrating or making decisions? 0 0  Walking or climbing stairs? 1 1  Dressing or bathing? 0 0  Doing errands, shopping? 0 0  Preparing Food and eating ?  N  Using the Toilet?  N  In the past six months, have you accidently leaked urine?  Y  Do you have problems with loss of bowel control?  N  Managing your Medications?  N  Managing your Finances?  N  Housekeeping or managing your Housekeeping?  N    Patient Care Team: Amon Aloysius BRAVO, MD as PCP - General (Internal Medicine) Fleeta Smock, Lamar BROCKS, MD as Consulting Physician (Allergy and Immunology) Tasia Lung, MD as Consulting Physician (Psychiatry) Camillo Golas, MD as Consulting Physician (Ophthalmology) Roseann, Adine PARAS., MD as Referring Physician (Urology)  I have updated your Care Teams any recent Medical Services you may have received from other providers in the past year.     Assessment:   This is a routine wellness examination for Fidel.  Hearing/Vision screen Hearing Screening - Comments:: Some hearing loss  Vision Screening - Comments:: Wears rx glasses - up to date with routine eye exams with [facility]    Goals Addressed             This Visit's Progress    Patient Stated   On track    Increase activity       Depression Screen     05/15/2024     2:21 PM 05/14/2024    2:01 PM 09/09/2023    2:45 PM 05/24/2023    3:56 PM 05/13/2023    3:52 PM 12/21/2022   10:06 AM 10/05/2022    3:44 PM  PHQ 2/9 Scores  PHQ - 2 Score 0 0 2 0 0 0 0  PHQ- 9 Score 0 0 5   2 2     Fall Risk     05/14/2024    2:01 PM 05/08/2024   10:52 AM 01/10/2024    1:13 PM 01/03/2024    1:00 PM 09/09/2023    2:19 PM  Fall Risk   Falls in the past year? 0 1 1 1 1   Number falls in past yr: 0 0 1 1 1   Injury with Fall? 0 0 0 0 1  Risk for fall due to :  Impaired balance/gait;Impaired mobility;History of fall(s) History of fall(s)    Follow up Falls evaluation completed;Education provided Education provided;Falls prevention discussed;Falls evaluation completed  Falls evaluation completed Falls evaluation completed;Education provided    MEDICARE RISK AT HOME:  Medicare Risk at Home Any stairs in or around the home?: (Patient-Rptd) No If so, are there any without handrails?: (Patient-Rptd) Yes Home free of loose throw rugs in walkways, pet beds, electrical cords, etc?: (Patient-Rptd) No Adequate lighting in  your home to reduce risk of falls?: (Patient-Rptd) Yes Life alert?: (Patient-Rptd) No Use of a cane, walker or w/c?: (Patient-Rptd) Yes Grab bars in the bathroom?: (Patient-Rptd) Yes Shower chair or bench in shower?: (Patient-Rptd) No Elevated toilet seat or a handicapped toilet?: (Patient-Rptd) Yes  TIMED UP AND GO:  Was the test performed?  No  Cognitive Function: 6CIT completed    02/13/2019    3:35 PM 02/09/2017    3:01 PM  MMSE - Mini Mental State Exam  Not completed: Unable to complete   Orientation to time  5   Orientation to Place  5   Registration  3   Attention/ Calculation  5   Recall  3   Language- name 2 objects  2   Language- repeat  1  Language- follow 3 step command  3   Language- read & follow direction  1   Write a sentence  1   Copy design  1   Total score  30      Data saved with a previous flowsheet row definition         05/15/2024    2:23 PM 05/13/2023    3:55 PM  6CIT Screen  What Year? 0 points 0 points  What month? 0 points 0 points  What time? 0 points 0 points  Count back from 20 0 points 0 points  Months in reverse 0 points 0 points  Repeat phrase 0 points 0 points  Total Score 0 points 0 points    Immunizations Immunization History  Administered Date(s) Administered   Fluad Quad(high Dose 65+) 07/12/2019, 08/16/2022   H1N1 11/14/2008   Influenza Split 08/12/2011, 08/03/2012, 08/19/2023   Influenza Whole 08/22/2009, 08/17/2010   Influenza, High Dose Seasonal PF 08/28/2018   Influenza,inj,Quad PF,6+ Mos 09/25/2013, 10/14/2014, 09/25/2015   Influenza-Unspecified 08/04/2016, 07/22/2017, 09/04/2020, 07/30/2021   PFIZER(Purple Top)SARS-COV-2 Vaccination 12/23/2019, 01/16/2020, 09/04/2020, 05/08/2021   PNEUMOCOCCAL CONJUGATE-20 03/15/2022   Pfizer Covid-19 Vaccine Bivalent Booster 37yrs & up 09/02/2021, 08/18/2022   Pfizer(Comirnaty)Fall Seasonal Vaccine 12 years and older 08/19/2023   Pneumococcal Conjugate-13 02/12/2015   Pneumococcal Polysaccharide-23 11/15/2008, 12/27/2013   Respiratory Syncytial Virus Vaccine,Recomb Aduvanted(Arexvy) 08/18/2022   Td 08/22/2009, 07/20/2010, 12/15/2016   Zoster Recombinant(Shingrix) 03/16/2017, 09/15/2017   Zoster, Live 04/20/2012    Screening Tests Health Maintenance  Topic Date Due   FOOT EXAM  12/22/2023   COVID-19 Vaccine (8 - 2024-25 season) 03/19/2025 (Originally 02/17/2024)   INFLUENZA VACCINE  06/15/2024   OPHTHALMOLOGY EXAM  09/07/2024   HEMOGLOBIN A1C  11/13/2024   Diabetic kidney evaluation - eGFR measurement  05/14/2025   Diabetic kidney evaluation - Urine ACR  05/14/2025   Medicare Annual Wellness (AWV)  05/15/2025   DTaP/Tdap/Td (4 - Tdap) 12/15/2026   Pneumococcal Vaccine: 50+ Years  Completed   Hepatitis C Screening  Completed   Zoster Vaccines- Shingrix  Completed   Hepatitis B Vaccines  Aged Out   HPV VACCINES  Aged Out    Meningococcal B Vaccine  Aged Out    Health Maintenance  Health Maintenance Due  Topic Date Due   FOOT EXAM  12/22/2023    Additional Screening:  Vision Screening: Recommended annual ophthalmology exams for early detection of glaucoma and other disorders of the eye. Would you like a referral to an eye doctor? No    Dental Screening: Recommended annual dental exams for proper oral hygiene  Community Resource Referral / Chronic Care Management: CRR required this visit?  No   CCM required this  visit?  No   Plan:    I have personally reviewed and noted the following in the patient's chart:   Medical and social history Use of alcohol, tobacco or illicit drugs  Current medications and supplements including opioid prescriptions. Patient is not currently taking opioid prescriptions. Functional ability and status Nutritional status Physical activity Advanced directives List of other physicians Hospitalizations, surgeries, and ER visits in previous 12 months Vitals Screenings to include cognitive, depression, and falls Referrals and appointments  In addition, I have reviewed and discussed with patient certain preventive protocols, quality metrics, and best practice recommendations. A written personalized care plan for preventive services as well as general preventive health recommendations were provided to patient.   Lavelle Pfeiffer [place], [place]   12/23/7972   After Visit Summary: (MyChart) Due to this being a telephonic visit, the after visit summary with patients personalized plan was offered to patient via MyChart   Notes: Nothing significant to report at this time.

## 2024-05-15 NOTE — Patient Instructions (Signed)
 Mr. Ryan Zhang , Thank you for taking time out of your busy schedule to complete your Annual Wellness Visit with me. I enjoyed our conversation and look forward to speaking with you again next year. I, as well as your care team,  appreciate your ongoing commitment to your health goals. Please review the following plan we discussed and let me know if I can assist you in the future. Your Game plan/ To Do List     Follow up Visits: Next Medicare AWV with our clinical staff: In 1 year    Have you seen your provider in the last 6 months (3 months if uncontrolled diabetes)? Yes Next Office Visit with your provider: 09/14/24 @ 2:00  Clinician Recommendations:  Aim for 30 minutes of exercise or brisk walking, 6-8 glasses of water, and 5 servings of fruits and vegetables each day.       This is a list of the screening recommended for you and due dates:  Health Maintenance  Topic Date Due   Complete foot exam   12/22/2023   COVID-19 Vaccine (8 - 2024-25 season) 03/19/2025*   Flu Shot  06/15/2024   Eye exam for diabetics  09/07/2024   Hemoglobin A1C  11/13/2024   Yearly kidney function blood test for diabetes  05/14/2025   Yearly kidney health urinalysis for diabetes  05/14/2025   Medicare Annual Wellness Visit  05/15/2025   DTaP/Tdap/Td vaccine (4 - Tdap) 12/15/2026   Pneumococcal Vaccine for age over 68  Completed   Hepatitis C Screening  Completed   Zoster (Shingles) Vaccine  Completed   Hepatitis B Vaccine  Aged Out   HPV Vaccine  Aged Out   Meningitis B Vaccine  Aged Out  *Topic was postponed. The date shown is not the original due date.    Advanced directives: (In Chart) A copy of your advanced directives are scanned into your chart should your provider ever need it. Advance Care Planning is important because it:  [x]  Makes sure you receive the medical care that is consistent with your values, goals, and preferences  [x]  It provides guidance to your family and loved ones and reduces  their decisional burden about whether or not they are making the right decisions based on your wishes.  Follow the link provided in your after visit summary or read over the paperwork we have mailed to you to help you started getting your Advance Directives in place. If you need assistance in completing these, please reach out to us  so that we can help you!  See attachments for Preventive Care and Fall Prevention Tips.

## 2024-05-16 DIAGNOSIS — J3081 Allergic rhinitis due to animal (cat) (dog) hair and dander: Secondary | ICD-10-CM | POA: Diagnosis not present

## 2024-05-16 DIAGNOSIS — J3089 Other allergic rhinitis: Secondary | ICD-10-CM | POA: Diagnosis not present

## 2024-05-16 DIAGNOSIS — J301 Allergic rhinitis due to pollen: Secondary | ICD-10-CM | POA: Diagnosis not present

## 2024-05-17 ENCOUNTER — Ambulatory Visit: Payer: Self-pay | Admitting: Internal Medicine

## 2024-05-21 ENCOUNTER — Other Ambulatory Visit: Payer: Self-pay | Admitting: Internal Medicine

## 2024-05-23 ENCOUNTER — Other Ambulatory Visit: Payer: Self-pay | Admitting: Neurology

## 2024-05-23 DIAGNOSIS — G2111 Neuroleptic induced parkinsonism: Secondary | ICD-10-CM

## 2024-05-23 DIAGNOSIS — G2401 Drug induced subacute dyskinesia: Secondary | ICD-10-CM

## 2024-05-23 DIAGNOSIS — J3089 Other allergic rhinitis: Secondary | ICD-10-CM | POA: Diagnosis not present

## 2024-05-23 DIAGNOSIS — J3081 Allergic rhinitis due to animal (cat) (dog) hair and dander: Secondary | ICD-10-CM | POA: Diagnosis not present

## 2024-05-23 DIAGNOSIS — J301 Allergic rhinitis due to pollen: Secondary | ICD-10-CM | POA: Diagnosis not present

## 2024-05-28 ENCOUNTER — Other Ambulatory Visit: Payer: Self-pay

## 2024-05-28 DIAGNOSIS — G2401 Drug induced subacute dyskinesia: Secondary | ICD-10-CM

## 2024-05-28 DIAGNOSIS — T43505A Adverse effect of unspecified antipsychotics and neuroleptics, initial encounter: Secondary | ICD-10-CM

## 2024-05-28 MED ORDER — CARBIDOPA-LEVODOPA 25-100 MG PO TABS: 1.0000 | ORAL_TABLET | Freq: Three times a day (TID) | ORAL | 0 refills | Status: DC

## 2024-05-29 ENCOUNTER — Telehealth: Payer: Self-pay | Admitting: Neurology

## 2024-05-29 NOTE — Telephone Encounter (Signed)
 Left a message with th eafter hour service on  05-29-24   Caller states he has been trying to get his RX since June he has been trying to get it for 2 week  now

## 2024-05-29 NOTE — Telephone Encounter (Signed)
 Called [facility] and RX is not going to process until Wednesday

## 2024-05-30 ENCOUNTER — Encounter: Payer: Self-pay | Admitting: Internal Medicine

## 2024-05-30 DIAGNOSIS — R269 Unspecified abnormalities of gait and mobility: Secondary | ICD-10-CM

## 2024-05-30 DIAGNOSIS — G2111 Neuroleptic induced parkinsonism: Secondary | ICD-10-CM

## 2024-05-30 DIAGNOSIS — R296 Repeated falls: Secondary | ICD-10-CM

## 2024-05-31 NOTE — Addendum Note (Signed)
 Addended by: Raea Magallon D on: 05/31/2024 08:00 AM   Modules accepted: Orders

## 2024-06-06 DIAGNOSIS — J3089 Other allergic rhinitis: Secondary | ICD-10-CM | POA: Diagnosis not present

## 2024-06-06 DIAGNOSIS — J301 Allergic rhinitis due to pollen: Secondary | ICD-10-CM | POA: Diagnosis not present

## 2024-06-13 DIAGNOSIS — J301 Allergic rhinitis due to pollen: Secondary | ICD-10-CM | POA: Diagnosis not present

## 2024-06-13 DIAGNOSIS — J3081 Allergic rhinitis due to animal (cat) (dog) hair and dander: Secondary | ICD-10-CM | POA: Diagnosis not present

## 2024-06-13 DIAGNOSIS — J3089 Other allergic rhinitis: Secondary | ICD-10-CM | POA: Diagnosis not present

## 2024-06-15 NOTE — Therapy (Signed)
 OUTPATIENT PHYSICAL THERAPY [facility]ARKIN[facility]'S EVALUATION   Patient Name: Ryan Zhang MRN: [id number] DOB:1946/10/17, 78 y.o., male Today's Date: 06/18/2024   END OF SESSION:  PT End of Session - 06/18/24 1017     Visit Number 1    Date for PT Re-Evaluation 09/10/24    Authorization Type [employer]    Authorization Time Period auth pending    Authorization - Visit Number 0    PT Start Time 1017    PT Stop Time 1104    PT Time Calculation (min) 47 min    Activity Tolerance Patient tolerated treatment well    Behavior During Therapy WFL for tasks assessed/performed          Past Medical History:  Diagnosis Date   Allergy    allergy shots Dr. Cloretta   Anemia    Anxiety    Asthma    moderate persistant   Bipolar affective (HCC)    Cataract    both eyes   Clotting disorder (HCC)    Due to blood thinner   COPD (chronic obstructive pulmonary disease) (HCC)    Depression    Diabetes mellitus    DJD (degenerative joint disease)    Dysphagia    Gallstones    hx of, s/p cholecystectomy   GERD (gastroesophageal reflux disease)    Hepatitis A    as teenager 60's   Hollenhorst plaque    right eye   Hyperlipidemia    Hypertension    Kidney stones    hx of   Meniere's disease    Motility disorder, esophageal    Obesity    Pancreatitis    Personal history of colonic polyps 11/2004   hyperplastic.   Stroke [facility])    per MRI   Past Surgical History:  Procedure Laterality Date   CATARACT EXTRACTION Bilateral 09/2015 and 10/2015   CHOLECYSTECTOMY     COLONOSCOPY     EYE SURGERY     JOINT REPLACEMENT     TOTAL KNEE ARTHROPLASTY Bilateral    both knees   Patient Active Problem List   Diagnosis Date Noted   Tinea cruris 07/24/2020   PCP NOTES >>>>>>>>>>>>>>>>>>>>>>>>>>>>>>> 01/12/2016   Folliculitis 05/09/2015   Hearing loss in right ear 08/05/2014   Panic attack as reaction to stress 04/20/2012   Dysphagia, neurologic 01/07/2012   Morbid obesity (HCC) 01/07/2012    Chest pain, musculoskeletal 12/07/2011   Candidiasis of skin 08/12/2011   Annual physical exam 06/07/2011   OLECRANON BURSITIS 01/29/2011   PARTIAL ARTERIAL OCCLUSION OF RETINA 05/27/2010   Essential hypertension, benign 01/16/2010   ABNORMAL ELECTROCARDIOGRAM 01/14/2010   Asthma 10/14/2009   GERD 10/14/2009   Bipolar disorder (HCC) 02/20/2009   PHIMOSIS 02/20/2009   KNEE PAIN, CHRONIC 11/20/2008   HIP PAIN, RIGHT 08/19/2008   DM type 2 with diabetic peripheral neuropathy (HCC) 02/22/2007   Hyperlipidemia associated with type 2 diabetes mellitus (HCC) 02/22/2007   ALLERGIC RHINITIS, SEASONAL 02/22/2007   LOW BACK PAIN, CHRONIC 02/22/2007   MENIERE'S DISEASE, HX OF 02/22/2007    PCP: Amon Aloysius BRAVO, MD   REFERRING PROVIDER: Amon Aloysius BRAVO, MD  (Neurologist - Evonnie Stabs, MD)  REFERRING DIAG:  [phone] (ICD-10-CM) - Neuroleptic-induced parkinsonism (HCC)  R26.9 (ICD-10-CM) - Gait disorder  R29.6 (ICD-10-CM) - Multiple falls   THERAPY DIAG:  Muscle weakness (generalized)  Unsteadiness on feet  Other abnormalities of gait and mobility  History of falling  RATIONALE FOR EVALUATION AND TREATMENT: Rehabilitation  ONSET DATE: ~2013 -  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  He reports weakness in his legs with limited endurance and activity tolerance as well as slow recovery.  Still recovering from going the grocery store yesterday.  Uses cane for short distances and platform RW for longer distances when in community but ambulates w/o AD in his  apartment.  Pt accompanied by: self  PAIN: Are you having pain? Yes: NPRS scale: 3/10  Pain location: B shoulders and R elbow Pain description: sharp in shoulders, sometimes aching  Aggravating factors: being up and moving around  Relieving factors: extra-strength Tylenol  when pain is really bad (mostly ignores the pain), when really bad, will take hydrocodone  to help him sleep   PERTINENT HISTORY:  Anxiety, asthma, bipolar affective disorder, COPD, depression, DM-II, DJD, chronic knee pain, B TKA, GERD, HTN, Mnire's disease, obesity, pancreatitis, h/o CVA, neurologic dysphagia, tardive dyskinesia, HOH - L deaf & R hearing aid  PRECAUTIONS: Fall  RED FLAGS: None  WEIGHT BEARING RESTRICTIONS: No  FALLS:  Has patient fallen in last 6 months? No and before the levodopa  supplements, falls were daily (sometimes several times a day)  LIVING ENVIRONMENT: Lives with: lives with their spouse and dog Lives in: Apartment Stairs: No Has following equipment at home: Single point cane, Environmental consultant - 2 wheeled, shower chair, and Grab bars (platform RW)  OCCUPATION: Retired  PLOF: Independent, Independent with community mobility with device, and Leisure: watching TV, listening to books, programming, walking ~15 min (around the block) - tries to do daily   PATIENT GOALS: To get back to the point where I can walk to the car unassisted (no AD) and use platform RW less in community.  Be able to assist my wife with bringing groceries in from the car.   OBJECTIVE: (objective measures completed at initial evaluation unless otherwise dated)  DIAGNOSTIC FINDINGS:  N/A - Multiple imaging studies in 2024 s/p falls but no acute fractures.  COGNITION: Overall cognitive status: Impaired and History of cognitive impairments - at baseline - mostly memory issues resulting from ECT 19 yrs ago, better over last 3 months but still notes limited  STM   SENSATION: WFL  COORDINATION: B Heel/toe WFL Heel to  shin unable bilaterally   POSTURE:  rounded shoulders, forward head, and flexed trunk   MUSCLE LENGTH: Hamstrings: mod/severe tight B Piriformis: mild /mod tight R>L Hip flexors: mod tight   LOWER EXTREMITY ROM:    Grossly WFL except limited hip extension and rotation bilaterally  LOWER EXTREMITY MMT:    MMT Right eval Left eval  Hip flexion 3+ 3+  Hip extension 3+ 3+  Hip abduction 3- 3  Hip adduction 3+ 3+  Hip internal rotation 4- 3+  Hip external rotation 4- 3+  Knee flexion 4 4  Knee extension 4 4  Ankle dorsiflexion 4- 4-  Ankle plantarflexion    Ankle inversion    Ankle eversion    (Blank rows = not tested)  BED MOBILITY:  SBA  TRANSFERS: Assistive device utilized: None  Sit to stand: Modified independence and SBA Stand to sit: Modified independence and SBA Chair to chair: SBA Floor: NT  GAIT: Distance walked: clinic distances Assistive device utilized: Single point cane, platform RW/2WW, and None Level of assistance: SBA Gait pattern: step through pattern, decreased arm swing- Right, decreased arm swing- Left, decreased stride length, decreased hip/knee flexion- Right, decreased hip/knee flexion- Left, decreased ankle dorsiflexion- Right, decreased ankle dorsiflexion- Left, shuffling, festinating, trunk flexed, poor foot clearance- Right, and poor foot clearance- Left Comments: gait deviations more pronounced with decreasing AD support  FUNCTIONAL TESTS:  5 times sit to stand: unable w/o UE assist - only able to complete 4 reps in 34.03 sec; 20.18 sec with B UE assist Timed up and go (TUG): Normal - 15.84 sec w/o AD, Manual - 15.19 sec w/o AD, Cognitive - 15.66 sec w/o AD 10 meter walk test: 18.25 sec w/o AD, 16.56 sec with SPC, 14.78 sec with platform RW Gait speed: 1.80 ft/sec w/o AD, 1.98 ft/sec with SPC, 2.22 ft/sec with platform RW Berg balance scale: TBA DGI: TBA  PATIENT SURVEYS:  ABC scale: 620 / 1600 = 38.8 %, indicating a low level of physical  functioning (<69% indicates risk for recurrent falls in PD)   TODAY'S TREATMENT:   06/18/2024  SELF CARE:  Reviewed eval findings and role of PT in addressing identified deficits as well as need for further assessment of balance and related fall risk.    PATIENT EDUCATION:  Education details: PT eval findings, anticipated POC, and need for further assessment of standardized balance tests  Person educated: Patient Education method: Explanation Education comprehension: verbalized understanding  HOME EXERCISE PROGRAM: TBD   ASSESSMENT:  CLINICAL IMPRESSION: Ryan Zhang is a 78 y.o. male who was referred to physical therapy for evaluation and treatment for gait disorder and multiple falls related to neuroleptic-induced parkinsonism.  Patient first diagnosed with Parkinsonism in 2013.  He reports falls were a major issue prior to starting carbidopa -levodopa , occurring on an almost daily basis and sometimes multiple time per day.  Since starting carbidopa -levodopa , he has not had any falls in the past 6 months but notes worsening LE weakness, endurance and activity tolerance over the past 6 months. Patient presents with physical impairments of decreased timing and coordination of gait, impaired ambulation, impaired standing balance, abnormal posture, bradykinesia with transfers, impaired activity tolerance, LE weakness, postural instability and decreased safety awareness impacting safe and independent functional mobility.  Examination revealed patient is at risk for falls and functional decline as evidenced by the following objective test measures: 5xSTS of 20.18 sec with B UE assist (  unable to complete 5 reps w/o UE assist despite repeated effort) (>15 sec indicates increased risk for falls and decreased BLE power), Gait speed 1.80 ft/sec w/o AD, 1.98 ft/sec with SPC and 2.22 ft/sec with platform RW (2.62 ft/sec is needed for community access and <1.8 ft/sec is indicative of risk for recurrent  falls), TUG of 15.84 sec (>13.5 sec indicates increased risk for falls), TUG Manual of 15.19 sec (difference between TUG manual and TUG >4.5 seconds indicates increased fall risk), and TUG cognitive of 15.66 sec (>/= 15 seconds indicates high risk for falls and community dwelling older adults).  TUG scores of >10% difference indicate difficulty with dual tasking.  ABC scale score of 38.8% indicates a low level of physical functioning.  Giancarlos will benefit from skilled PT to address above deficits to improve mobility and activity tolerance to help reach the maximal level of functional independence with mobility and gait with reduced risk for falls.  Patient demonstrates understanding of this POC and is in agreement with this plan.   OBJECTIVE IMPAIRMENTS: Abnormal gait, decreased activity tolerance, decreased balance, decreased coordination, decreased endurance, decreased knowledge of condition, decreased knowledge of use of DME, decreased mobility, difficulty walking, decreased strength, decreased safety awareness, dizziness, impaired perceived functional ability, impaired flexibility, improper body mechanics, postural dysfunction, and pain.   ACTIVITY LIMITATIONS: carrying, lifting, bending, standing, squatting, stairs, transfers, bed mobility, reach over head, locomotion level, and caring for others  PARTICIPATION LIMITATIONS: meal prep, cleaning, laundry, shopping, and community activity  PERSONAL FACTORS: Age, Fitness, Past/current experiences, Time since onset of injury/illness/exacerbation, and 3+ comorbidities: Anxiety, asthma, bipolar affective disorder, COPD, depression, DM-II, DJD, chronic knee pain, B TKA, GERD, HTN, Mnire's disease, obesity, pancreatitis, h/o CVA, neurologic dysphagia, tardive dyskinesia, HOH - L deaf & R hearing aid are also affecting patient's functional outcome.   REHAB POTENTIAL: Good  CLINICAL DECISION MAKING: Unstable/unpredictable  EVALUATION COMPLEXITY:  High   GOALS: Goals reviewed with patient? Yes  SHORT TERM GOALS: Target date: 07/30/2024  Patient will be independent with initial HEP. Baseline: To be estblished Goal status: INITIAL  2.  Patient will demonstrate decreased fall risk by scoring < 13.5 sec on normal TUG. Baseline: 15.84 sec w/o AD Goal status: INITIAL  3.  Patient will be educated on strategies to decrease risk of falls.  Baseline:  Goal status: INITIAL  LONG TERM GOALS: Target date: 09/10/2024  Patient will be independent with ongoing/advanced HEP for self-management at home incorporating PWR! Moves as indicated .  Baseline:  Goal status: INITIAL  2.  Patient will be able to ambulate 600' with LRAD with good safety to access community.  Baseline:  Goal status: INITIAL  3.  Patient will be able to step up/down curb safely with LRAD for safety with community ambulation.  Baseline:  Goal status: INITIAL   4.  Patient will demonstrate gait speed of >/= 2.62 ft/sec (0.8 m/s) to be a safe limited community ambulator with decreased risk for recurrent falls.  Baseline: 1.80 ft/sec w/o AD, 1.98 ft/sec with SPC and 2.22 ft/sec with platform RW Goal status: INITIAL  5.  Patient will improve 5x STS time to </= 15 seconds w/o need for UE assist to demonstrate improved functional strength and transfer efficiency. Baseline: unable w/o UE assist - only able to complete 4 reps in 34.03 sec; 20.18 sec with B UE assist Goal status: INITIAL  6.  Patient will demonstrate at least 19/24 on DGI to improve gait stability and reduce risk for falls. Baseline:  TBA Goal status: INITIAL  7.  Patient will improve Berg score by at least 8 points to improve safety and stability with ADLs in standing and reduce risk for falls. (MCID = 8 points)   Baseline: TBA Goal status: INITIAL  8.  Patient will report >/= 52% on ABC scale to demonstrate improved balance confidence and decreased risk for falls. Baseline: 620 / 1600 = 38.8  % Goal status: INITIAL  9. Patient will verbalize understanding of local Parkinson's disease community resources, including community fitness post d/c. Baseline:  Goal status: INITIAL   PLAN:  PT FREQUENCY: 2x/week  PT DURATION: 12 weeks  PLANNED INTERVENTIONS: 97164- PT Re-evaluation, 97750- Physical Performance Testing, 97110-Therapeutic exercises, 97530- Therapeutic activity, [id number]- Neuromuscular re-education, 97535- Self Care, 02859- Manual therapy, [phone]- Gait training, [phone]- Canalith repositioning, [id number]- Electrical stimulation (unattended), 97035- Ultrasound, 02966- Ionotophoresis 4mg /ml Dexamethasone , 79439 (1-2 muscles), 20561 (3+ muscles)- Dry Needling, Patient/Family education, Balance training, Stair training, Taping, Joint mobilization, Vestibular training, DME instructions, Cryotherapy, and Moist heat  PLAN FOR NEXT SESSION: Complete Berg & DGI; create initial HEP for LE flexibility and core/LE strengthening   Elijah CHRISTELLA Hidden, PT 06/18/2024, 12:12 PM    Date of referral: 05/31/24 Referring provider: Amon Aloysius BRAVO, MD Referring diagnosis?  G21.11,T43.505A (ICD-10-CM) - Neuroleptic-induced parkinsonism (HCC)  R26.9 (ICD-10-CM) - Gait disorder  R29.6 (ICD-10-CM) - Multiple falls   Treatment diagnosis? (if different than referring diagnosis)  Muscle weakness (generalized)  Unsteadiness on feet  Other abnormalities of gait and mobility  History of falling  What was this (referring dx) caused by? Ongoing Issue and Other: neurodegenerative disease  Nature of Condition: Chronic (continuous duration > 3 months)   Laterality: Both  Current Functional Measure Score: Other ABC Scale: 620 / 1600 = 38.8 %, indicating a low level of physical functioning (<69% indicates risk for recurrent falls in PD)    Objective measurements identify impairments when they are compared to normal values, the uninvolved extremity, and prior level of function.  [x]  Yes  []  No  Objective  assessment of functional ability: Moderate functional limitations   Briefly describe symptoms:  Patient first diagnosed with neuroleptic-induced Parkinsonism in 2013.  He reports falls were a major issue prior to starting carbidopa -levodopa , occurring on an almost daily basis and sometimes multiple time per day.  Since starting carbidopa -levodopa , he has not had any falls in the past 6 months but notes worsening LE weakness, endurance and activity tolerance over the past 6 months. Patient presents with physical impairments of decreased timing and coordination of gait, impaired ambulation, impaired standing balance, abnormal posture, bradykinesia with transfers, impaired activity tolerance, LE weakness, postural instability and decreased safety awareness impacting safe and independent functional mobility.  Examination revealed patient is at risk for falls and functional decline as evidenced by the following objective test measures: 5xSTS of 20.18 sec with B UE assist (unable to complete 5 reps w/o UE assist despite repeated effort) (>15 sec indicates increased risk for falls and decreased BLE power), Gait speed 1.80 ft/sec w/o AD, 1.98 ft/sec with SPC and 2.22 ft/sec with platform RW (2.62 ft/sec is needed for community access and <1.8 ft/sec is indicative of risk for recurrent falls), TUG of 15.84 sec (>13.5 sec indicates increased risk for falls), TUG Manual of 15.19 sec (difference between TUG manual and TUG >4.5 seconds indicates increased fall risk), and TUG cognitive of 15.66 sec (>/= 15 seconds indicates high risk for falls and community dwelling older adults).  TUG scores of >10% difference indicate  difficulty with dual tasking.  ABC scale score of 38.8% indicates a low level of physical functioning.    How did symptoms start: gradual onset  Average pain intensity:  Last 24 hours: 3/10  Past week: 5/10  How often does the pt experience symptoms? Frequently  How much have the symptoms interfered  with usual daily activities? Quite a bit  How has condition changed since care began at this facility? NA - initial visit  In general, how is the patients overall health? Fair  Onset date: Parkinsonism diagnosis in 2013, worsening weakness over the past 6 months    BACK PAIN (STarT Back Screening Tool) - (When applicable): N/A  Has your back pain spread down your leg(s) at sometime in the last 2 weeks? []  Yes   []  No Have you had pain in the shoulder or neck at sometime in the past 2 weeks? []  Yes   []  No Have you only walked short distances because of your back pain? []  Yes   []  No In the past 2 weeks, have you dressed more slowly than usual because of your back pain? []  Yes   []  No Do you think it is not really safe for person with a condition like yours to be physically active? []  Yes   []  No Have worrying thoughts been going through your mind a lot of the time? []  Yes   []  No Do you feel that your back pain is terrible and it is never going to get any better? []  Yes   []  No In general, have you stopped enjoying all the things you usually enjoy? []  Yes   []  No Overall, how bothersome has your back pain been in the last 2 weeks? []  Not at all   []  Slightly     []  Moderate   []  Very much     []  Extremely

## 2024-06-18 ENCOUNTER — Other Ambulatory Visit: Payer: Self-pay

## 2024-06-18 ENCOUNTER — Encounter: Payer: Self-pay | Admitting: Physical Therapy

## 2024-06-18 ENCOUNTER — Ambulatory Visit: Attending: Internal Medicine | Admitting: Physical Therapy

## 2024-06-18 DIAGNOSIS — R2689 Other abnormalities of gait and mobility: Secondary | ICD-10-CM | POA: Diagnosis not present

## 2024-06-18 DIAGNOSIS — G2111 Neuroleptic induced parkinsonism: Secondary | ICD-10-CM | POA: Diagnosis not present

## 2024-06-18 DIAGNOSIS — M6281 Muscle weakness (generalized): Secondary | ICD-10-CM | POA: Insufficient documentation

## 2024-06-18 DIAGNOSIS — R2681 Unsteadiness on feet: Secondary | ICD-10-CM | POA: Insufficient documentation

## 2024-06-18 DIAGNOSIS — R296 Repeated falls: Secondary | ICD-10-CM | POA: Diagnosis not present

## 2024-06-18 DIAGNOSIS — Z9181 History of falling: Secondary | ICD-10-CM | POA: Insufficient documentation

## 2024-06-18 DIAGNOSIS — T43505A Adverse effect of unspecified antipsychotics and neuroleptics, initial encounter: Secondary | ICD-10-CM | POA: Insufficient documentation

## 2024-06-18 DIAGNOSIS — R269 Unspecified abnormalities of gait and mobility: Secondary | ICD-10-CM | POA: Diagnosis not present

## 2024-06-20 DIAGNOSIS — J301 Allergic rhinitis due to pollen: Secondary | ICD-10-CM | POA: Diagnosis not present

## 2024-06-20 DIAGNOSIS — J3089 Other allergic rhinitis: Secondary | ICD-10-CM | POA: Diagnosis not present

## 2024-06-23 ENCOUNTER — Other Ambulatory Visit: Payer: Self-pay | Admitting: Internal Medicine

## 2024-06-26 ENCOUNTER — Ambulatory Visit

## 2024-06-26 DIAGNOSIS — Z9181 History of falling: Secondary | ICD-10-CM

## 2024-06-26 DIAGNOSIS — M6281 Muscle weakness (generalized): Secondary | ICD-10-CM | POA: Diagnosis not present

## 2024-06-26 DIAGNOSIS — T43505A Adverse effect of unspecified antipsychotics and neuroleptics, initial encounter: Secondary | ICD-10-CM | POA: Diagnosis not present

## 2024-06-26 DIAGNOSIS — R269 Unspecified abnormalities of gait and mobility: Secondary | ICD-10-CM | POA: Diagnosis not present

## 2024-06-26 DIAGNOSIS — G2111 Neuroleptic induced parkinsonism: Secondary | ICD-10-CM | POA: Diagnosis not present

## 2024-06-26 DIAGNOSIS — R2689 Other abnormalities of gait and mobility: Secondary | ICD-10-CM

## 2024-06-26 DIAGNOSIS — R2681 Unsteadiness on feet: Secondary | ICD-10-CM | POA: Diagnosis not present

## 2024-06-26 DIAGNOSIS — R296 Repeated falls: Secondary | ICD-10-CM | POA: Diagnosis not present

## 2024-06-26 NOTE — Therapy (Signed)
 OUTPATIENT PHYSICAL THERAPY [facility]   Patient Name: Ryan Zhang MRN: [id number] DOB:11-21-1945, 78 y.o., male Today's Date: 06/26/2024   END OF SESSION:  PT End of Session - 06/26/24 1622     Visit Number 2    Date for PT Re-Evaluation 09/10/24    Authorization Type [employer]    Authorization Time Period 06/18/24-07/16/24    Authorization - Visit Number 2    Authorization - Number of Visits 6    PT Start Time 1535    PT Stop Time 1615    PT Time Calculation (min) 40 min    Activity Tolerance Patient tolerated treatment well    Behavior During Therapy WFL for tasks assessed/performed           Past Medical History:  Diagnosis Date   Allergy    allergy shots Dr. Cloretta   Anemia    Anxiety    Asthma    moderate persistant   Bipolar affective (HCC)    Cataract    both eyes   Clotting disorder (HCC)    Due to blood thinner   COPD (chronic obstructive pulmonary disease) (HCC)    Depression    Diabetes mellitus    DJD (degenerative joint disease)    Dysphagia    Gallstones    hx of, s/p cholecystectomy   GERD (gastroesophageal reflux disease)    Hepatitis A    as teenager 60's   Hollenhorst plaque    right eye   Hyperlipidemia    Hypertension    Kidney stones    hx of   Meniere's disease    Motility disorder, esophageal    Obesity    Pancreatitis    Personal history of colonic polyps 11/2004   hyperplastic.   Stroke [facility])    per MRI   Past Surgical History:  Procedure Laterality Date   CATARACT EXTRACTION Bilateral 09/2015 and 10/2015   CHOLECYSTECTOMY     COLONOSCOPY     EYE SURGERY     JOINT REPLACEMENT     TOTAL KNEE ARTHROPLASTY Bilateral    both knees   Patient Active Problem List   Diagnosis Date Noted   Tinea cruris 07/24/2020   PCP NOTES >>>>>>>>>>>>>>>>>>>>>>>>>>>>>>> 01/12/2016   Folliculitis 05/09/2015   Hearing loss in right ear 08/05/2014   Panic attack as reaction to stress 04/20/2012   Dysphagia, neurologic  01/07/2012   Morbid obesity (HCC) 01/07/2012   Chest pain, musculoskeletal 12/07/2011   Candidiasis of skin 08/12/2011   Annual physical exam 06/07/2011   OLECRANON BURSITIS 01/29/2011   PARTIAL ARTERIAL OCCLUSION OF RETINA 05/27/2010   Essential hypertension, benign 01/16/2010   ABNORMAL ELECTROCARDIOGRAM 01/14/2010   Asthma 10/14/2009   GERD 10/14/2009   Bipolar disorder (HCC) 02/20/2009   PHIMOSIS 02/20/2009   KNEE PAIN, CHRONIC 11/20/2008   HIP PAIN, RIGHT 08/19/2008   DM type 2 with diabetic peripheral neuropathy (HCC) 02/22/2007   Hyperlipidemia associated with type 2 diabetes mellitus (HCC) 02/22/2007   ALLERGIC RHINITIS, SEASONAL 02/22/2007   LOW BACK PAIN, CHRONIC 02/22/2007   MENIERE'S DISEASE, HX OF 02/22/2007    PCP: Amon Aloysius BRAVO, MD   REFERRING PROVIDER: Amon Aloysius BRAVO, MD  (Neurologist - Evonnie Stabs, MD)  REFERRING DIAG:  [phone] (ICD-10-CM) - Neuroleptic-induced parkinsonism (HCC)  R26.9 (ICD-10-CM) - Gait disorder  R29.6 (ICD-10-CM) - Multiple falls   THERAPY DIAG:  Muscle weakness (generalized)  Unsteadiness on feet  Other abnormalities of gait and mobility  History of falling  RATIONALE FOR  EVALUATION AND TREATMENT: Rehabilitation  ONSET DATE: ~2013 - 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 Yes: NPRS scale: 2-3/10  Pain location: B shoulders and R elbow Pain description: sharp in shoulders, sometimes aching  Aggravating factors: being up and moving around  Relieving factors: extra-strength Tylenol  when pain is really bad  (mostly ignores the pain), when really bad, will take hydrocodone  to help him sleep   PERTINENT HISTORY:  Anxiety, asthma, bipolar affective disorder, COPD, depression, DM-II, DJD, chronic knee pain, B TKA, GERD, HTN, Mnire's disease, obesity, pancreatitis, h/o CVA, neurologic dysphagia, tardive dyskinesia, HOH - L deaf & R hearing aid  PRECAUTIONS: Fall  RED FLAGS: None  WEIGHT BEARING RESTRICTIONS: No  FALLS:  Has patient fallen in last 6 months? No and before the levodopa  supplements, falls were daily (sometimes several times a day)  LIVING ENVIRONMENT: Lives with: lives with their spouse and dog Lives in: Apartment Stairs: No Has following equipment at home: Single point cane, Environmental consultant - 2 wheeled, shower chair, and Grab bars (platform RW)  OCCUPATION: Retired  PLOF: Independent, Independent with community mobility with device, and Leisure: watching TV, listening to books, programming, walking ~15 min (around the block) - tries to do daily   PATIENT GOALS: To get back to the point where I can walk to the car unassisted (no AD) and use platform RW less in community.  Be able to assist my wife with bringing groceries in from the car.   OBJECTIVE: (objective measures completed at initial evaluation unless otherwise dated)  DIAGNOSTIC FINDINGS:  N/A - Multiple imaging studies in 2024 s/p falls but no acute fractures.  COGNITION: Overall cognitive status: Impaired and History of cognitive impairments - at baseline - mostly memory issues resulting from ECT 19 yrs ago, better over last 3 months but still notes limited STM   SENSATION: WFL  COORDINATION: B Heel/toe WFL Heel to shin unable bilaterally   POSTURE:  rounded shoulders, forward head, and flexed trunk   MUSCLE LENGTH: Hamstrings: mod/severe tight B Piriformis: mild /mod tight R>L Hip flexors: mod tight   LOWER EXTREMITY ROM:    Grossly  WFL except limited hip extension and rotation bilaterally  LOWER  EXTREMITY MMT:    MMT Right eval Left eval  Hip flexion 3+ 3+  Hip extension 3+ 3+  Hip abduction 3- 3  Hip adduction 3+ 3+  Hip internal rotation 4- 3+  Hip external rotation 4- 3+  Knee flexion 4 4  Knee extension 4 4  Ankle dorsiflexion 4- 4-  Ankle plantarflexion    Ankle inversion    Ankle eversion    (Blank rows = not tested)  BED MOBILITY:  SBA  TRANSFERS: Assistive device utilized: None  Sit to stand: Modified independence and SBA Stand to sit: Modified independence and SBA Chair to chair: SBA Floor: NT  GAIT: Distance walked: clinic distances Assistive device utilized: Single point cane, platform RW/2WW, and None Level of assistance: SBA Gait pattern: step through pattern, decreased arm swing- Right, decreased arm swing- Left, decreased stride length, decreased hip/knee flexion- Right, decreased hip/knee flexion- Left, decreased ankle dorsiflexion- Right, decreased ankle dorsiflexion- Left, shuffling, festinating, trunk flexed, poor foot clearance- Right, and poor foot clearance- Left Comments: gait deviations more pronounced with decreasing AD support  FUNCTIONAL TESTS:  5 times sit to stand: unable w/o UE assist - only able to complete 4 reps in 34.03 sec; 20.18 sec with B UE assist Timed up and go (TUG): Normal - 15.84 sec w/o AD, Manual - 15.19 sec w/o AD, Cognitive - 15.66 sec w/o AD 10 meter walk test: 18.25 sec w/o AD, 16.56 sec with SPC, 14.78 sec with platform RW Gait speed: 1.80 ft/sec w/o AD, 1.98 ft/sec with SPC, 2.22 ft/sec with platform RW Berg balance scale: TBA DGI: TBA  PATIENT SURVEYS:  ABC scale: 620 / 1600 = 38.8 %, indicating a low level of physical functioning (<69% indicates risk for recurrent falls in PD)   TODAY'S TREATMENT:  06/26/24 Seated hip ADD ball squeeze 10x3; 2 sets Seated hip ABD RTB 2x10 Seated marching RTB 2x10  NEUROMUSCULAR RE-EDUCATION: To improve coordination, kinesthesia, posture, and proprioception.   [facility] PT  Assessment - 06/26/24 0001       Standardized Balance Assessment   Standardized Balance Assessment Berg Balance Test;Dynamic Gait Index      Berg Balance Test   Sit to Stand Able to stand  independently using hands    Standing Unsupported Able to stand safely 2 minutes    Sitting with Back Unsupported but Feet Supported on Floor or Stool Able to sit safely and securely 2 minutes    Stand to Sit Sits safely with minimal use of hands    Transfers Able to transfer safely, minor use of hands    Standing Unsupported with Eyes Closed Able to stand 10 seconds safely    Standing Unsupported with Feet Together Able to place feet together independently and stand 1 minute safely    From Standing, Reach Forward with Outstretched Arm Can reach confidently >25 cm (10)    From Standing Position, Pick up Object from Floor Able to pick up shoe safely and easily    From Standing Position, Turn to Look Behind Over each Shoulder Looks behind from both sides and weight shifts well    Turn 360 Degrees Able to turn 360 degrees safely but slowly    Standing Unsupported, Alternately Place Feet on Step/Stool Needs assistance to keep from falling or unable to try    Standing Unsupported, One Foot in [employer] balance while stepping or standing    Standing on One Leg Unable to try  or needs assist to prevent fall    Total Score 41      Dynamic Gait Index   Level Surface Moderate Impairment    Change in Gait Speed Mild Impairment    Gait with Horizontal Head Turns Moderate Impairment    Gait with Vertical Head Turns Mild Impairment    Gait and Pivot Turn Mild Impairment    Step Over Obstacle Moderate Impairment    Step Around Obstacles Mild Impairment    Steps Moderate Impairment    Total Score 12          06/18/2024  SELF CARE:  Reviewed eval findings and role of PT in addressing identified deficits as well as need for further assessment of balance and related fall risk.    PATIENT EDUCATION:   Education details: PT eval findings, anticipated POC, and need for further assessment of standardized balance tests  Person educated: Patient Education method: Explanation Education comprehension: verbalized understanding  HOME EXERCISE PROGRAM: Access Code: [email or web address] URL: [email or web address] Date: 06/26/2024 Prepared by: Brailyn Killion  Exercises - Seated Hip Abduction with Resistance  - 1 x daily - 7 x weekly - 3 sets - 10 reps - Seated Hip Adduction Isometrics with Ball  - 1 x daily - 7 x weekly - 3 sets - 10 reps - Seated March with Resistance  - 1 x daily - 7 x weekly - 3 sets - 10 reps   ASSESSMENT:  CLINICAL IMPRESSION: BERG score indicates low fall risk, DGI indicate high fall risks. Pt shows more instability with dynamic activities. He most notably lost balance while turning around in circle to the left side during BERG test. Trygg will benefit from skilled PT to address above deficits to improve mobility and activity tolerance to help reach the maximal level of functional independence with mobility and gait with reduced risk for falls.  Patient demonstrates understanding of this POC and is in agreement with this plan.   OBJECTIVE IMPAIRMENTS: Abnormal gait, decreased activity tolerance, decreased balance, decreased coordination, decreased endurance, decreased knowledge of condition, decreased knowledge of use of DME, decreased mobility, difficulty walking, decreased strength, decreased safety awareness, dizziness, impaired perceived functional ability, impaired flexibility, improper body mechanics, postural dysfunction, and pain.   ACTIVITY LIMITATIONS: carrying, lifting, bending, standing, squatting, stairs, transfers, bed mobility, reach over head, locomotion level, and caring for others  PARTICIPATION LIMITATIONS: meal prep, cleaning, laundry, shopping, and community activity  PERSONAL FACTORS: Age, Fitness, Past/current experiences, Time since onset of  injury/illness/exacerbation, and 3+ comorbidities: Anxiety, asthma, bipolar affective disorder, COPD, depression, DM-II, DJD, chronic knee pain, B TKA, GERD, HTN, Mnire's disease, obesity, pancreatitis, h/o CVA, neurologic dysphagia, tardive dyskinesia, HOH - L deaf & R hearing aid are also affecting patient's functional outcome.   REHAB POTENTIAL: Good  CLINICAL DECISION MAKING: Unstable/unpredictable  EVALUATION COMPLEXITY: High   GOALS: Goals reviewed with patient? Yes  SHORT TERM GOALS: Target date: 07/30/2024  Patient will be independent with initial HEP. Baseline: To be estblished Goal status: INITIAL  2.  Patient will demonstrate decreased fall risk by scoring < 13.5 sec on normal TUG. Baseline: 15.84 sec w/o AD Goal status: INITIAL  3.  Patient will be educated on strategies to decrease risk of falls.  Baseline:  Goal status: INITIAL  LONG TERM GOALS: Target date: 09/10/2024  Patient will be independent with ongoing/advanced HEP for self-management at home incorporating PWR! Moves as indicated .  Baseline:  Goal status: INITIAL  2.  Patient will be able to ambulate 600'  with LRAD with good safety to access community.  Baseline:  Goal status: INITIAL  3.  Patient will be able to step up/down curb safely with LRAD for safety with community ambulation.  Baseline:  Goal status: INITIAL   4.  Patient will demonstrate gait speed of >/= 2.62 ft/sec (0.8 m/s) to be a safe limited community ambulator with decreased risk for recurrent falls.  Baseline: 1.80 ft/sec w/o AD, 1.98 ft/sec with SPC and 2.22 ft/sec with platform RW Goal status: INITIAL  5.  Patient will improve 5x STS time to </= 15 seconds w/o need for UE assist to demonstrate improved functional strength and transfer efficiency. Baseline: unable w/o UE assist - only able to complete 4 reps in 34.03 sec; 20.18 sec with B UE assist Goal status: INITIAL  6.  Patient will demonstrate at least 19/24 on DGI to  improve gait stability and reduce risk for falls. Baseline: TBA Goal status: INITIAL  7.  Patient will improve Berg score by at least 8 points to improve safety and stability with ADLs in standing and reduce risk for falls. (MCID = 8 points)   Baseline: TBA Goal status: INITIAL  8.  Patient will report >/= 52% on ABC scale to demonstrate improved balance confidence and decreased risk for falls. Baseline: 620 / 1600 = 38.8 % Goal status: INITIAL  9. Patient will verbalize understanding of local Parkinson's disease community resources, including community fitness post d/c. Baseline:  Goal status: INITIAL   PLAN:  PT FREQUENCY: 2x/week  PT DURATION: 12 weeks  PLANNED INTERVENTIONS: 97164- PT Re-evaluation, 97750- Physical Performance Testing, 97110-Therapeutic exercises, 97530- Therapeutic activity, [id number]- Neuromuscular re-education, 97535- Self Care, [id number]- Manual therapy, [phone]- Gait training, [phone]- Canalith repositioning, [id number]- Electrical stimulation (unattended), 97035- Ultrasound, 02966- Ionotophoresis 4mg /ml Dexamethasone , 79439 (1-2 muscles), 20561 (3+ muscles)- Dry Needling, Patient/Family education, Balance training, Stair training, Taping, Joint mobilization, Vestibular training, DME instructions, Cryotherapy, and Moist heat  PLAN FOR NEXT SESSION: Review and expand HEP for LE flexibility and core/LE strengthening   Benigna Delisi L Makel Mcmann, PTA 06/26/2024, 5:06 PM    Date of referral: 05/31/24 Referring provider: Amon Aloysius BRAVO, MD Referring diagnosis?  G21.11,T43.505A (ICD-10-CM) - Neuroleptic-induced parkinsonism (HCC)  R26.9 (ICD-10-CM) - Gait disorder  R29.6 (ICD-10-CM) - Multiple falls   Treatment diagnosis? (if different than referring diagnosis)  Muscle weakness (generalized)  Unsteadiness on feet  Other abnormalities of gait and mobility  History of falling  What was this (referring dx) caused by? Ongoing Issue and Other: neurodegenerative disease  Nature of  Condition: Chronic (continuous duration > 3 months)   Laterality: Both  Current Functional Measure Score: Other ABC Scale: 620 / 1600 = 38.8 %, indicating a low level of physical functioning (<69% indicates risk for recurrent falls in PD)    Objective measurements identify impairments when they are compared to normal values, the uninvolved extremity, and prior level of function.  [x]  Yes  []  No  Objective assessment of functional ability: Moderate functional limitations   Briefly describe symptoms:  Patient first diagnosed with neuroleptic-induced Parkinsonism in 2013.  He reports falls were a major issue prior to starting carbidopa -levodopa , occurring on an almost daily basis and sometimes multiple time per day.  Since starting carbidopa -levodopa , he has not had any falls in the past 6 months but notes worsening LE weakness, endurance and activity tolerance over the past 6 months. Patient presents with physical impairments of decreased timing and coordination of gait, impaired ambulation, impaired standing balance, abnormal posture, bradykinesia with transfers,  impaired activity tolerance, LE weakness, postural instability and decreased safety awareness impacting safe and independent functional mobility.  Examination revealed patient is at risk for falls and functional decline as evidenced by the following objective test measures: 5xSTS of 20.18 sec with B UE assist (unable to complete 5 reps w/o UE assist despite repeated effort) (>15 sec indicates increased risk for falls and decreased BLE power), Gait speed 1.80 ft/sec w/o AD, 1.98 ft/sec with SPC and 2.22 ft/sec with platform RW (2.62 ft/sec is needed for community access and <1.8 ft/sec is indicative of risk for recurrent falls), TUG of 15.84 sec (>13.5 sec indicates increased risk for falls), TUG Manual of 15.19 sec (difference between TUG manual and TUG >4.5 seconds indicates increased fall risk), and TUG cognitive of 15.66 sec (>/= 15 seconds  indicates high risk for falls and community dwelling older adults).  TUG scores of >10% difference indicate difficulty with dual tasking.  ABC scale score of 38.8% indicates a low level of physical functioning.    How did symptoms start: gradual onset  Average pain intensity:  Last 24 hours: 3/10  Past week: 5/10  How often does the pt experience symptoms? Frequently  How much have the symptoms interfered with usual daily activities? Quite a bit  How has condition changed since care began at this facility? NA - initial visit  In general, how is the patients overall health? Fair  Onset date: Parkinsonism diagnosis in 2013, worsening weakness over the past 6 months    BACK PAIN (STarT Back Screening Tool) - (When applicable): N/A  Has your back pain spread down your leg(s) at sometime in the last 2 weeks? []  Yes   []  No Have you had pain in the shoulder or neck at sometime in the past 2 weeks? []  Yes   []  No Have you only walked short distances because of your back pain? []  Yes   []  No In the past 2 weeks, have you dressed more slowly than usual because of your back pain? []  Yes   []  No Do you think it is not really safe for person with a condition like yours to be physically active? []  Yes   []  No Have worrying thoughts been going through your mind a lot of the time? []  Yes   []  No Do you feel that your back pain is terrible and it is never going to get any better? []  Yes   []  No In general, have you stopped enjoying all the things you usually enjoy? []  Yes   []  No Overall, how bothersome has your back pain been in the last 2 weeks? []  Not at all   []  Slightly     []  Moderate   []  Very much     []  Extremely

## 2024-06-27 DIAGNOSIS — J3081 Allergic rhinitis due to animal (cat) (dog) hair and dander: Secondary | ICD-10-CM | POA: Diagnosis not present

## 2024-06-27 DIAGNOSIS — J301 Allergic rhinitis due to pollen: Secondary | ICD-10-CM | POA: Diagnosis not present

## 2024-06-27 DIAGNOSIS — J3089 Other allergic rhinitis: Secondary | ICD-10-CM | POA: Diagnosis not present

## 2024-07-03 ENCOUNTER — Ambulatory Visit: Admitting: Physical Therapy

## 2024-07-05 ENCOUNTER — Ambulatory Visit: Admitting: Physical Therapy

## 2024-07-05 DIAGNOSIS — T43505A Adverse effect of unspecified antipsychotics and neuroleptics, initial encounter: Secondary | ICD-10-CM | POA: Diagnosis not present

## 2024-07-05 DIAGNOSIS — R2681 Unsteadiness on feet: Secondary | ICD-10-CM

## 2024-07-05 DIAGNOSIS — Z9181 History of falling: Secondary | ICD-10-CM

## 2024-07-05 DIAGNOSIS — R2689 Other abnormalities of gait and mobility: Secondary | ICD-10-CM

## 2024-07-05 DIAGNOSIS — M6281 Muscle weakness (generalized): Secondary | ICD-10-CM | POA: Diagnosis not present

## 2024-07-05 DIAGNOSIS — R296 Repeated falls: Secondary | ICD-10-CM | POA: Diagnosis not present

## 2024-07-05 DIAGNOSIS — G2111 Neuroleptic induced parkinsonism: Secondary | ICD-10-CM | POA: Diagnosis not present

## 2024-07-05 DIAGNOSIS — R269 Unspecified abnormalities of gait and mobility: Secondary | ICD-10-CM | POA: Diagnosis not present

## 2024-07-05 NOTE — Therapy (Signed)
 OUTPATIENT PHYSICAL THERAPY [facility]   Patient Name: Ryan Zhang MRN: [id number] DOB:1946-03-09, 78 y.o., male Today's Date: 07/05/2024   END OF SESSION:  PT End of Session - 07/05/24 1618     Visit Number 3    Date for PT Re-Evaluation 09/10/24    Authorization Type [employer]    Authorization Time Period 06/18/24-07/16/24    Authorization - Visit Number 2    Authorization - Number of Visits 6    PT Start Time 1618    PT Stop Time 1658    PT Time Calculation (min) 40 min    Equipment Utilized During Treatment Gait belt    Activity Tolerance Patient tolerated treatment well    Behavior During Therapy WFL for tasks assessed/performed           Past Medical History:  Diagnosis Date   Allergy    allergy shots Dr. Cloretta   Anemia    Anxiety    Asthma    moderate persistant   Bipolar affective (HCC)    Cataract    both eyes   Clotting disorder (HCC)    Due to blood thinner   COPD (chronic obstructive pulmonary disease) (HCC)    Depression    Diabetes mellitus    DJD (degenerative joint disease)    Dysphagia    Gallstones    hx of, s/p cholecystectomy   GERD (gastroesophageal reflux disease)    Hepatitis A    as teenager 60's   Hollenhorst plaque    right eye   Hyperlipidemia    Hypertension    Kidney stones    hx of   Meniere's disease    Motility disorder, esophageal    Obesity    Pancreatitis    Personal history of colonic polyps 11/2004   hyperplastic.   Stroke [facility])    per MRI   Past Surgical History:  Procedure Laterality Date   CATARACT EXTRACTION Bilateral 09/2015 and 10/2015   CHOLECYSTECTOMY     COLONOSCOPY     EYE SURGERY     JOINT REPLACEMENT     TOTAL KNEE ARTHROPLASTY Bilateral    both knees   Patient Active Problem List   Diagnosis Date Noted   Tinea cruris 07/24/2020   PCP NOTES >>>>>>>>>>>>>>>>>>>>>>>>>>>>>>> 01/12/2016   Folliculitis 05/09/2015   Hearing loss in right ear 08/05/2014   Panic attack as reaction  to stress 04/20/2012   Dysphagia, neurologic 01/07/2012   Morbid obesity (HCC) 01/07/2012   Chest pain, musculoskeletal 12/07/2011   Candidiasis of skin 08/12/2011   Annual physical exam 06/07/2011   OLECRANON BURSITIS 01/29/2011   PARTIAL ARTERIAL OCCLUSION OF RETINA 05/27/2010   Essential hypertension, benign 01/16/2010   ABNORMAL ELECTROCARDIOGRAM 01/14/2010   Asthma 10/14/2009   GERD 10/14/2009   Bipolar disorder (HCC) 02/20/2009   PHIMOSIS 02/20/2009   KNEE PAIN, CHRONIC 11/20/2008   HIP PAIN, RIGHT 08/19/2008   DM type 2 with diabetic peripheral neuropathy (HCC) 02/22/2007   Hyperlipidemia associated with type 2 diabetes mellitus (HCC) 02/22/2007   ALLERGIC RHINITIS, SEASONAL 02/22/2007   LOW BACK PAIN, CHRONIC 02/22/2007   MENIERE'S DISEASE, HX OF 02/22/2007    PCP: Amon Aloysius BRAVO, MD   REFERRING PROVIDER: Amon Aloysius BRAVO, MD  (Neurologist - Evonnie Stabs, MD)  REFERRING DIAG:  [phone] (ICD-10-CM) - Neuroleptic-induced parkinsonism (HCC)  R26.9 (ICD-10-CM) - Gait disorder  R29.6 (ICD-10-CM) - Multiple falls   THERAPY DIAG:  Muscle weakness (generalized)  Unsteadiness on feet  Other abnormalities of gait  and mobility  History of falling  RATIONALE FOR EVALUATION AND TREATMENT: Rehabilitation  ONSET DATE: ~2013 - 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 Reports that he has still been doing his walking and exercises.   Pt accompanied by: self  PAIN: Are you having pain? Yes: NPRS scale: 4/10 (when using arms)  Pain location: B shoulders and R elbow Pain  description: sharp in shoulders, sometimes aching  Aggravating factors: being up and moving around  Relieving factors: extra-strength Tylenol  when pain is really bad (mostly ignores the pain), when really bad, will take hydrocodone  to help him sleep   PERTINENT HISTORY:  Anxiety, asthma, bipolar affective disorder, COPD, depression, DM-II, DJD, chronic knee pain, B TKA, GERD, HTN, Mnire's disease, obesity, pancreatitis, h/o CVA, neurologic dysphagia, tardive dyskinesia, HOH - L deaf & R hearing aid  PRECAUTIONS: Fall  RED FLAGS: None  WEIGHT BEARING RESTRICTIONS: No  FALLS:  Has patient fallen in last 6 months? No and before the levodopa  supplements, falls were daily (sometimes several times a day)  LIVING ENVIRONMENT: Lives with: lives with their spouse and dog Lives in: Apartment Stairs: No Has following equipment at home: Single point cane, Environmental consultant - 2 wheeled, shower chair, and Grab bars (platform RW)  OCCUPATION: Retired  PLOF: Independent, Independent with community mobility with device, and Leisure: watching TV, listening to books, programming, walking ~15 min (around the block) - tries to do daily   PATIENT GOALS: To get back to the point where I can walk to the car unassisted (no AD) and use platform RW less in community.  Be able to assist my wife with bringing groceries in from the car.   OBJECTIVE: (objective measures completed at initial evaluation unless otherwise dated)  DIAGNOSTIC FINDINGS:  N/A - Multiple imaging studies in 2024 s/p falls but no acute fractures.  COGNITION: Overall cognitive status: Impaired and History of cognitive impairments - at baseline - mostly memory issues resulting from ECT 19 yrs ago, better over last 3 months but still notes limited STM   SENSATION: WFL  COORDINATION: B Heel/toe WFL Heel to shin unable bilaterally   POSTURE:  rounded shoulders, forward head, and flexed trunk   MUSCLE LENGTH: Hamstrings: mod/severe tight  B Piriformis: mild /mod tight R>L Hip flexors: mod tight   LOWER EXTREMITY ROM:    Grossly WFL except limited hip extension and rotation bilaterally  LOWER EXTREMITY MMT:    MMT Right eval Left eval  Hip flexion 3+ 3+  Hip extension 3+ 3+  Hip abduction 3- 3  Hip adduction 3+ 3+  Hip internal rotation 4- 3+  Hip external rotation 4- 3+  Knee flexion 4 4  Knee extension 4 4  Ankle dorsiflexion 4- 4-  Ankle plantarflexion    Ankle inversion    Ankle eversion    (Blank rows = not tested)  BED MOBILITY:  SBA  TRANSFERS: Assistive device utilized: None  Sit to stand: Modified independence and SBA Stand to sit: Modified independence and SBA Chair to chair: SBA Floor: NT  GAIT: Distance walked: clinic distances Assistive device utilized: Single point cane, platform RW/2WW, and None Level of assistance: SBA Gait pattern: step through pattern, decreased arm swing- Right, decreased arm swing- Left, decreased stride length, decreased hip/knee flexion- Right, decreased hip/knee flexion- Left, decreased ankle dorsiflexion- Right, decreased ankle dorsiflexion- Left, shuffling, festinating, trunk flexed, poor foot clearance- Right, and poor foot clearance- Left Comments: gait deviations more pronounced with decreasing AD support  FUNCTIONAL TESTS:  5 times sit to stand: unable w/o UE assist - only able to complete 4 reps in 34.03 sec; 20.18 sec with B UE assist Timed up and go (TUG): Normal - 15.84 sec w/o AD, Manual - 15.19 sec w/o AD, Cognitive - 15.66 sec w/o AD 10 meter walk test: 18.25 sec w/o AD, 16.56 sec with SPC, 14.78 sec with platform RW Gait speed: 1.80 ft/sec w/o AD, 1.98 ft/sec with SPC, 2.22 ft/sec with platform RW Berg balance scale: TBA DGI: TBA  PATIENT SURVEYS:  ABC scale: 620 / 1600 = 38.8 %, indicating a low level of physical functioning (<69% indicates risk for recurrent falls in PD)   TODAY'S TREATMENT:  07/05/2024 THERAPEUTIC EXERCISE: To improve  strength, endurance, and ROM.  Demonstration, verbal and tactile cues throughout for technique.   Gait 300' w/ platform walker  Hooklying Bridges 2x10  S/L Clamshells 2x10 B- L side feels tighter/more difficult  Seated hip add w/ ball squeeze 2x10, 3  Seated hip abd RTB 2x10  Seated Marches x10 B  Alt Seated Marches x20 B  STS from chair x10- fatigue noted by rep 6  NEURO RE-EDUCATION: Focusing on balance, upright posture  Side Stepping 3x10' Fwd Weight Shifts- cued to place weight into leading foot and come up onto heel of opposite foot  Fwd Stepping over foam roll w/ counter support x10  Lateral Stepping over foam roll w/ counter support x10 B  Standing hip abd w/ counter support x10 B- cued to maintain movement w/o letting feet slap the ground  Standing hip ext w/ counter support  x10 B Toe Taps onto shoe box 2x10 B- cued to gently tap the box to ensure controlled motion   06/26/24 Seated hip ADD ball squeeze 10x3; 2 sets Seated hip ABD RTB 2x10 Seated marching RTB 2x10  NEUROMUSCULAR RE-EDUCATION: To improve coordination, kinesthesia, posture, and proprioception.   [facility] PT Assessment - 06/26/24 0001       Standardized Balance Assessment   Standardized Balance Assessment Berg Balance Test;Dynamic Gait Index      Berg Balance Test   Sit to Stand Able to stand  independently using hands  Standing Unsupported Able to stand safely 2 minutes    Sitting with Back Unsupported but Feet Supported on Floor or Stool Able to sit safely and securely 2 minutes    Stand to Sit Sits safely with minimal use of hands    Transfers Able to transfer safely, minor use of hands    Standing Unsupported with Eyes Closed Able to stand 10 seconds safely    Standing Unsupported with Feet Together Able to place feet together independently and stand 1 minute safely    From Standing, Reach Forward with Outstretched Arm Can reach confidently >25 cm (10)    From Standing Position, Pick up Object from  Floor Able to pick up shoe safely and easily    From Standing Position, Turn to Look Behind Over each Shoulder Looks behind from both sides and weight shifts well    Turn 360 Degrees Able to turn 360 degrees safely but slowly    Standing Unsupported, Alternately Place Feet on Step/Stool Needs assistance to keep from falling or unable to try    Standing Unsupported, One Foot in [employer] balance while stepping or standing    Standing on One Leg Unable to try or needs assist to prevent fall    Total Score 41      Dynamic Gait Index   Level Surface Moderate Impairment    Change in Gait Speed Mild Impairment    Gait with Horizontal Head Turns Moderate Impairment    Gait with Vertical Head Turns Mild Impairment    Gait and Pivot Turn Mild Impairment    Step Over Obstacle Moderate Impairment    Step Around Obstacles Mild Impairment    Steps Moderate Impairment    Total Score 12          06/18/2024  SELF CARE:  Reviewed eval findings and role of PT in addressing identified deficits as well as need for further assessment of balance and related fall risk.    PATIENT EDUCATION:  Education details: PT eval findings, anticipated POC, and need for further assessment of standardized balance tests  Person educated: Patient Education method: Explanation Education comprehension: verbalized understanding  HOME EXERCISE PROGRAM: Access Code: [email or web address] URL: [email or web address] Date: 07/05/2024 Prepared by: Eusebio Saba  Exercises - Seated Hip Abduction with Resistance  - 1 x daily - 7 x weekly - 3 sets - 10 reps - Seated Hip Adduction Isometrics with Ball  - 1 x daily - 7 x weekly - 3 sets - 10 reps - Seated March with Resistance  - 1 x daily - 7 x weekly - 3 sets - 10 reps - Sit to Stand  - 1 x daily - 7 x weekly - 1 sets - 5 reps  ASSESSMENT:  CLINICAL IMPRESSION: Vir reports that last week he had a dizzy spell that was longer than any of his previous episodes. Notes  that he was not doing anything in particular that brought the dizziness on. Also is still experiencing R arm and elbow pain today, states it is always painful. Began LE/core strengthening and balance exercises with Shravan today. He was able to perform standing balance activities w/ CGA and counter top support. He seems to have inc'd difficulty with H/L bridges d/t weakness in hip extension. He also had difficulty with fwd weight shifts as he had issues placing weight anteriorly to where he could hardly accomplish lifting the heel of his opposite foot for several reps. Pt required seated breaks throughout the session d/t fatigue and stated  his legs felt weak as well. Sallie will continue to benefit from skilled PT to improve mobility and activity tolerance w/ dec'd fall risk.   OBJECTIVE IMPAIRMENTS: Abnormal gait, decreased activity tolerance, decreased balance, decreased coordination, decreased endurance, decreased knowledge of condition, decreased knowledge of use of DME, decreased mobility, difficulty walking, decreased strength, decreased safety awareness, dizziness, impaired perceived functional ability, impaired flexibility, improper body mechanics, postural dysfunction, and pain.   ACTIVITY LIMITATIONS: carrying, lifting, bending, standing, squatting, stairs, transfers, bed mobility, reach over head, locomotion level, and caring for others  PARTICIPATION LIMITATIONS: meal prep, cleaning, laundry, shopping, and community activity  PERSONAL FACTORS: Age, Fitness, Past/current experiences, Time since onset of injury/illness/exacerbation, and 3+ comorbidities: Anxiety, asthma, bipolar affective disorder, COPD, depression, DM-II, DJD, chronic knee pain, B TKA, GERD, HTN, Mnire's disease, obesity, pancreatitis, h/o CVA, neurologic dysphagia, tardive dyskinesia, HOH - L deaf & R hearing aid are also affecting patient's functional outcome.   REHAB POTENTIAL: Good  CLINICAL DECISION MAKING:  Unstable/unpredictable  EVALUATION COMPLEXITY: High   GOALS: Goals reviewed with patient? Yes  SHORT TERM GOALS: Target date: 07/30/2024  Patient will be independent with initial HEP. Baseline: To be estblished Goal status: IN PROGRESS-07/05/24- consistent with current HEP   2.  Patient will demonstrate decreased fall risk by scoring < 13.5 sec on normal TUG. Baseline: 15.84 sec w/o AD Goal status: INITIAL  3.  Patient will be educated on strategies to decrease risk of falls.  Baseline:  Goal status: INITIAL  LONG TERM GOALS: Target date: 09/10/2024  Patient will be independent with ongoing/advanced HEP for self-management at home incorporating PWR! Moves as indicated .  Baseline:  Goal status: INITIAL  2.  Patient will be able to ambulate 600' with LRAD with good safety to access community.  Baseline:  Goal status: INITIAL  3.  Patient will be able to step up/down curb safely with LRAD for safety with community ambulation.  Baseline:  Goal status: INITIAL   4.  Patient will demonstrate gait speed of >/= 2.62 ft/sec (0.8 m/s) to be a safe limited community ambulator with decreased risk for recurrent falls.  Baseline: 1.80 ft/sec w/o AD, 1.98 ft/sec with SPC and 2.22 ft/sec with platform RW Goal status: INITIAL  5.  Patient will improve 5x STS time to </= 15 seconds w/o need for UE assist to demonstrate improved functional strength and transfer efficiency. Baseline: unable w/o UE assist - only able to complete 4 reps in 34.03 sec; 20.18 sec with B UE assist Goal status: INITIAL  6.  Patient will demonstrate at least 19/24 on DGI to improve gait stability and reduce risk for falls. Baseline: TBA Goal status: INITIAL  7.  Patient will improve Berg score by at least 8 points to improve safety and stability with ADLs in standing and reduce risk for falls. (MCID = 8 points)   Baseline: TBA Goal status: INITIAL  8.  Patient will report >/= 52% on ABC scale to demonstrate  improved balance confidence and decreased risk for falls. Baseline: 620 / 1600 = 38.8 % Goal status: INITIAL  9. Patient will verbalize understanding of local Parkinson's disease community resources, including community fitness post d/c. Baseline:  Goal status: INITIAL   PLAN:  PT FREQUENCY: 2x/week  PT DURATION: 12 weeks  PLANNED INTERVENTIONS: 97164- PT Re-evaluation, 97750- Physical Performance Testing, 97110-Therapeutic exercises, 97530- Therapeutic activity, [id number]- Neuromuscular re-education, 97535- Self Care, [id number]- Manual therapy, [id number]- Gait training, [phone]- Canalith repositioning, H9716- Electrical stimulation (unattended), 97035- Ultrasound, 02966- Ionotophoresis  4mg /ml Dexamethasone , 20560 (1-2 muscles), 20561 (3+ muscles)- Dry Needling, Patient/Family education, Balance training, Stair training, Taping, Joint mobilization, Vestibular training, DME instructions, Cryotherapy, and Moist heat  PLAN FOR NEXT SESSION: Review and expand HEP for LE flexibility and core/LE strengthening; continue LE strengthening; balance activities (both static and dynamic); pt education on fall prevention    Tamani Durney Jerrye Keel 07/05/2024, 5:34 PM    Date of referral: 05/31/24 Referring provider: Amon Aloysius BRAVO, MD Referring diagnosis?  G21.11,T43.505A (ICD-10-CM) - Neuroleptic-induced parkinsonism (HCC)  R26.9 (ICD-10-CM) - Gait disorder  R29.6 (ICD-10-CM) - Multiple falls   Treatment diagnosis? (if different than referring diagnosis)  Muscle weakness (generalized)  Unsteadiness on feet  Other abnormalities of gait and mobility  History of falling  What was this (referring dx) caused by? Ongoing Issue and Other: neurodegenerative disease  Nature of Condition: Chronic (continuous duration > 3 months)   Laterality: Both  Current Functional Measure Score: Other ABC Scale: 620 / 1600 = 38.8 %, indicating a low level of physical functioning (<69% indicates risk for recurrent falls in  PD)    Objective measurements identify impairments when they are compared to normal values, the uninvolved extremity, and prior level of function.  [x]  Yes  []  No  Objective assessment of functional ability: Moderate functional limitations   Briefly describe symptoms:  Patient first diagnosed with neuroleptic-induced Parkinsonism in 2013.  He reports falls were a major issue prior to starting carbidopa -levodopa , occurring on an almost daily basis and sometimes multiple time per day.  Since starting carbidopa -levodopa , he has not had any falls in the past 6 months but notes worsening LE weakness, endurance and activity tolerance over the past 6 months. Patient presents with physical impairments of decreased timing and coordination of gait, impaired ambulation, impaired standing balance, abnormal posture, bradykinesia with transfers, impaired activity tolerance, LE weakness, postural instability and decreased safety awareness impacting safe and independent functional mobility.  Examination revealed patient is at risk for falls and functional decline as evidenced by the following objective test measures: 5xSTS of 20.18 sec with B UE assist (unable to complete 5 reps w/o UE assist despite repeated effort) (>15 sec indicates increased risk for falls and decreased BLE power), Gait speed 1.80 ft/sec w/o AD, 1.98 ft/sec with SPC and 2.22 ft/sec with platform RW (2.62 ft/sec is needed for community access and <1.8 ft/sec is indicative of risk for recurrent falls), TUG of 15.84 sec (>13.5 sec indicates increased risk for falls), TUG Manual of 15.19 sec (difference between TUG manual and TUG >4.5 seconds indicates increased fall risk), and TUG cognitive of 15.66 sec (>/= 15 seconds indicates high risk for falls and community dwelling older adults).  TUG scores of >10% difference indicate difficulty with dual tasking.  ABC scale score of 38.8% indicates a low level of physical functioning.    How did symptoms start:  gradual onset  Average pain intensity:  Last 24 hours: 3/10  Past week: 5/10  How often does the pt experience symptoms? Frequently  How much have the symptoms interfered with usual daily activities? Quite a bit  How has condition changed since care began at this facility? NA - initial visit  In general, how is the patients overall health? Fair  Onset date: Parkinsonism diagnosis in 2013, worsening weakness over the past 6 months    BACK PAIN (STarT Back Screening Tool) - (When applicable): N/A  Has your back pain spread down your leg(s) at sometime in the last 2 weeks? []  Yes   []   No Have you had pain in the shoulder or neck at sometime in the past 2 weeks? []  Yes   []  No Have you only walked short distances because of your back pain? []  Yes   []  No In the past 2 weeks, have you dressed more slowly than usual because of your back pain? []  Yes   []  No Do you think it is not really safe for person with a condition like yours to be physically active? []  Yes   []  No Have worrying thoughts been going through your mind a lot of the time? []  Yes   []  No Do you feel that your back pain is terrible and it is never going to get any better? []  Yes   []  No In general, have you stopped enjoying all the things you usually enjoy? []  Yes   []  No Overall, how bothersome has your back pain been in the last 2 weeks? []  Not at all   []  Slightly     []  Moderate   []  Very much     []  Extremely

## 2024-07-09 ENCOUNTER — Ambulatory Visit: Admitting: Physical Therapy

## 2024-07-09 ENCOUNTER — Encounter: Payer: Self-pay | Admitting: Physical Therapy

## 2024-07-09 DIAGNOSIS — M6281 Muscle weakness (generalized): Secondary | ICD-10-CM

## 2024-07-09 DIAGNOSIS — G2111 Neuroleptic induced parkinsonism: Secondary | ICD-10-CM | POA: Diagnosis not present

## 2024-07-09 DIAGNOSIS — R2689 Other abnormalities of gait and mobility: Secondary | ICD-10-CM | POA: Diagnosis not present

## 2024-07-09 DIAGNOSIS — T43505A Adverse effect of unspecified antipsychotics and neuroleptics, initial encounter: Secondary | ICD-10-CM | POA: Diagnosis not present

## 2024-07-09 DIAGNOSIS — R2681 Unsteadiness on feet: Secondary | ICD-10-CM | POA: Diagnosis not present

## 2024-07-09 DIAGNOSIS — R296 Repeated falls: Secondary | ICD-10-CM | POA: Diagnosis not present

## 2024-07-09 DIAGNOSIS — Z9181 History of falling: Secondary | ICD-10-CM

## 2024-07-09 DIAGNOSIS — R269 Unspecified abnormalities of gait and mobility: Secondary | ICD-10-CM | POA: Diagnosis not present

## 2024-07-09 NOTE — Therapy (Signed)
 OUTPATIENT PHYSICAL THERAPY [facility]   Patient Name: Ryan Zhang MRN: [id number] DOB:1945-12-14, 78 y.o., male Today's Date: 07/09/2024   END OF SESSION:  PT End of Session - 07/09/24 1448     Visit Number 4    Date for PT Re-Evaluation 09/10/24    Authorization Type [employer]    Authorization Time Period 06/18/24-07/16/24    Authorization - Visit Number 3    Authorization - Number of Visits 6    PT Start Time 1448    PT Stop Time 1530    PT Time Calculation (min) 42 min    Activity Tolerance Patient tolerated treatment well    Behavior During Therapy WFL for tasks assessed/performed            Past Medical History:  Diagnosis Date   Allergy    allergy shots Dr. Cloretta   Anemia    Anxiety    Asthma    moderate persistant   Bipolar affective (HCC)    Cataract    both eyes   Clotting disorder (HCC)    Due to blood thinner   COPD (chronic obstructive pulmonary disease) (HCC)    Depression    Diabetes mellitus    DJD (degenerative joint disease)    Dysphagia    Gallstones    hx of, s/p cholecystectomy   GERD (gastroesophageal reflux disease)    Hepatitis A    as teenager 60's   Hollenhorst plaque    right eye   Hyperlipidemia    Hypertension    Kidney stones    hx of   Meniere's disease    Motility disorder, esophageal    Obesity    Pancreatitis    Personal history of colonic polyps 11/2004   hyperplastic.   Stroke [facility])    per MRI   Past Surgical History:  Procedure Laterality Date   CATARACT EXTRACTION Bilateral 09/2015 and 10/2015   CHOLECYSTECTOMY     COLONOSCOPY     EYE SURGERY     JOINT REPLACEMENT     TOTAL KNEE ARTHROPLASTY Bilateral    both knees   Patient Active Problem List   Diagnosis Date Noted   Tinea cruris 07/24/2020   PCP NOTES >>>>>>>>>>>>>>>>>>>>>>>>>>>>>>> 01/12/2016   Folliculitis 05/09/2015   Hearing loss in right ear 08/05/2014   Panic attack as reaction to stress 04/20/2012   Dysphagia, neurologic  01/07/2012   Morbid obesity (HCC) 01/07/2012   Chest pain, musculoskeletal 12/07/2011   Candidiasis of skin 08/12/2011   Annual physical exam 06/07/2011   OLECRANON BURSITIS 01/29/2011   PARTIAL ARTERIAL OCCLUSION OF RETINA 05/27/2010   Essential hypertension, benign 01/16/2010   ABNORMAL ELECTROCARDIOGRAM 01/14/2010   Asthma 10/14/2009   GERD 10/14/2009   Bipolar disorder (HCC) 02/20/2009   PHIMOSIS 02/20/2009   KNEE PAIN, CHRONIC 11/20/2008   HIP PAIN, RIGHT 08/19/2008   DM type 2 with diabetic peripheral neuropathy (HCC) 02/22/2007   Hyperlipidemia associated with type 2 diabetes mellitus (HCC) 02/22/2007   ALLERGIC RHINITIS, SEASONAL 02/22/2007   LOW BACK PAIN, CHRONIC 02/22/2007   MENIERE'S DISEASE, HX OF 02/22/2007    PCP: Amon Aloysius BRAVO, MD   REFERRING PROVIDER: Amon Aloysius BRAVO, MD  (Neurologist - Evonnie Stabs, MD)  REFERRING DIAG:  [phone] (ICD-10-CM) - Neuroleptic-induced parkinsonism (HCC)  R26.9 (ICD-10-CM) - Gait disorder  R29.6 (ICD-10-CM) - Multiple falls   THERAPY DIAG:  Muscle weakness (generalized)  Unsteadiness on feet  Other abnormalities of gait and mobility  History of falling  RATIONALE  FOR EVALUATION AND TREATMENT: Rehabilitation  ONSET DATE: ~2013 - 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 He notes +'s and -'s: easier maneuverability but harder to control on inclines.  Pt accompanied by: self  PAIN: Are you having pain? Yes: NPRS scale: 3-5/10 (when using arms)  Pain location: L shoulder, elbow, wrist, hand and thumb Pain description: sharp in shoulders, sometimes aching  Aggravating factors: being  up and moving around  Relieving factors: extra-strength Tylenol  when pain is really bad (mostly ignores the pain), when really bad, will take hydrocodone  to help him sleep   PERTINENT HISTORY:  Anxiety, asthma, bipolar affective disorder, COPD, depression, DM-II, DJD, chronic knee pain, B TKA, GERD, HTN, Mnire's disease, obesity, pancreatitis, h/o CVA, neurologic dysphagia, tardive dyskinesia, HOH - L deaf & R hearing aid  PRECAUTIONS: Fall  RED FLAGS: None  WEIGHT BEARING RESTRICTIONS: No  FALLS:  Has patient fallen in last 6 months? No and before the levodopa  supplements, falls were daily (sometimes several times a day)  LIVING ENVIRONMENT: Lives with: lives with their spouse and dog Lives in: Apartment Stairs: No Has following equipment at home: Single point cane, Environmental consultant - 2 wheeled, shower chair, and Grab bars (platform RW)  OCCUPATION: Retired  PLOF: Independent, Independent with community mobility with device, and Leisure: watching TV, listening to books, programming, walking ~15 min (around the block) - tries to do daily   PATIENT GOALS: To get back to the point where I can walk to the car unassisted (no AD) and use platform RW less in community.  Be able to assist my wife with bringing groceries in from the car.   OBJECTIVE: (objective measures completed at initial evaluation unless otherwise dated)  DIAGNOSTIC FINDINGS:  N/A - Multiple imaging studies in 2024 s/p falls but no acute fractures.  COGNITION: Overall cognitive status: Impaired and History of cognitive impairments - at baseline - mostly memory issues resulting from ECT 19 yrs ago, better over last 3 months but still notes limited STM   SENSATION: WFL  COORDINATION: B Heel/toe WFL Heel to shin unable bilaterally   POSTURE:  rounded shoulders, forward head, and flexed trunk   MUSCLE LENGTH: Hamstrings: mod/severe tight B Piriformis: mild /mod tight  R>L Hip flexors: mod tight   LOWER EXTREMITY  ROM:    Grossly WFL except limited hip extension and rotation bilaterally  LOWER EXTREMITY MMT:    MMT Right eval Left eval  Hip flexion 3+ 3+  Hip extension 3+ 3+  Hip abduction 3- 3  Hip adduction 3+ 3+  Hip internal rotation 4- 3+  Hip external rotation 4- 3+  Knee flexion 4 4  Knee extension 4 4  Ankle dorsiflexion 4- 4-  Ankle plantarflexion    Ankle inversion    Ankle eversion    (Blank rows = not tested)  BED MOBILITY:  SBA  TRANSFERS: Assistive device utilized: None  Sit to stand: Modified independence and SBA Stand to sit: Modified independence and SBA Chair to chair: SBA Floor: NT  GAIT: Distance walked: clinic distances Assistive device utilized: Single point cane, platform RW/2WW, and None Level of assistance: SBA Gait pattern: step through pattern, decreased arm swing- Right, decreased arm swing- Left, decreased stride length, decreased hip/knee flexion- Right, decreased hip/knee flexion- Left, decreased ankle dorsiflexion- Right, decreased ankle dorsiflexion- Left, shuffling, festinating, trunk flexed, poor foot clearance- Right, and poor foot clearance- Left Comments: gait deviations more pronounced with decreasing AD support  FUNCTIONAL TESTS:  5 times sit to stand: unable w/o UE assist - only able to complete 4 reps in 34.03 sec; 20.18 sec with B UE assist Timed up and go (TUG): Normal - 15.84 sec w/o AD, Manual - 15.19 sec w/o AD, Cognitive - 15.66 sec w/o AD 10 meter walk test: 18.25 sec w/o AD, 16.56 sec with SPC, 14.78 sec with platform RW Gait speed: 1.80 ft/sec w/o AD, 1.98 ft/sec with SPC, 2.22 ft/sec with platform RW Berg balance scale: TBA DGI: TBA  PATIENT SURVEYS:  ABC scale: 620 / 1600 = 38.8 %, indicating a low level of physical functioning (<69% indicates risk for recurrent falls in PD)   TODAY'S TREATMENT:   07/09/2024  THERAPEUTIC EXERCISE: To improve strength, endurance, and flexibility.  Demonstration, verbal and tactile cues  throughout for technique.  NuStep - L4 x 6 min (x 2' with UE/LE, remaining 4' just LE) Seated hip hinge HS stretch 2 x 30 bil Seated hip hinge figure-4 piriformis stretch 2 x 30 bil Seated hip flexor stretch in lunge position over edge of seat 2 x 30 bil  SELF CARE: Provided education to improve safety with use of platform rollator for assistive device.  Assess platform rollator height, reinforcing upright posture and as close proximity as seat will allow. Adjusted tension on rollator brakes to increase brake engagement against wheel to secure walker during transfers and allow for better control on inclines.  Provided instruction in safe transfer techniques with rollator, ensuring brakes are locked and pushing from/reaching for chair/seat during sit <> stand transition rather than pulling on walker. Reviewed the Check for Safety - Home Fall Prevention Checklist for Older Adults to help identify fall risk hazards in the home along with strategies to reduce fall risk at home.   07/05/2024 THERAPEUTIC EXERCISE: To improve strength, endurance, and ROM.  Demonstration, verbal and tactile cues throughout for technique.   Gait 300' w/ platform walker  Hooklying Bridges 2x10  S/L Clamshells 2x10 B- L side feels tighter/more difficult  Seated hip add w/ ball squeeze 2x10, 3  Seated hip abd RTB 2x10  Seated Marches x10 B  Alt Seated Marches x20 B  STS from chair x10- fatigue noted by rep 6   NEURO RE-EDUCATION: Focusing on balance, upright  posture  Side Stepping 3x10' Fwd Weight Shifts- cued to place weight into leading foot and come up onto heel of opposite foot  Fwd Stepping over foam roll w/ counter support x10  Lateral Stepping over foam roll w/ counter support x10 B  Standing hip abd w/ counter support x10 B- cued to maintain movement w/o letting feet slap the ground  Standing hip ext w/ counter support  x10 B Toe Taps onto shoe box 2x10 B- cued to gently tap the box to ensure  controlled motion    06/26/24 Seated hip ADD ball squeeze 10x3; 2 sets Seated hip ABD RTB 2x10 Seated marching RTB 2x10  NEUROMUSCULAR RE-EDUCATION: To improve coordination, kinesthesia, posture, and proprioception.   Standardized Balance Assessment   Standardized Balance Assessment Berg Balance Test;Dynamic Gait Index      Berg Balance Test   Sit to Stand Able to stand  independently using hands    Standing Unsupported Able to stand safely 2 minutes    Sitting with Back Unsupported but Feet Supported on Floor or Stool Able to sit safely and securely 2 minutes    Stand to Sit Sits safely with minimal use of hands    Transfers Able to transfer safely, minor use of hands    Standing Unsupported with Eyes Closed Able to stand 10 seconds safely    Standing Unsupported with Feet Together Able to place feet together independently and stand 1 minute safely    From Standing, Reach Forward with Outstretched Arm Can reach confidently >25 cm (10)    From Standing Position, Pick up Object from Floor Able to pick up shoe safely and easily    From Standing Position, Turn to Look Behind Over each Shoulder Looks behind from both sides and weight shifts well    Turn 360 Degrees Able to turn 360 degrees safely but slowly    Standing Unsupported, Alternately Place Feet on Step/Stool Needs assistance to keep from falling or unable to try    Standing Unsupported, One Foot in [employer] balance while stepping or standing    Standing on One Leg Unable to try or needs assist to prevent fall    Total Score 41      Dynamic Gait Index   Level Surface Moderate Impairment    Change in Gait Speed Mild Impairment    Gait with Horizontal Head Turns Moderate Impairment    Gait with Vertical Head Turns Mild Impairment    Gait and Pivot Turn Mild Impairment    Step Over Obstacle Moderate Impairment    Step Around Obstacles Mild Impairment    Steps Moderate Impairment    Total Score 12       06/18/2024  SELF  CARE:  Reviewed eval findings and role of PT in addressing identified deficits as well as need for further assessment of balance and related fall risk.    PATIENT EDUCATION:  Education details: HEP update - proximal LE stretches, transfer safety, gait safety with platform rollator, and Check for Safety - Home Fall Prevention Checklist for Older Adults   Person educated: Patient Education method: Explanation, Demonstration, Verbal cues, Handouts, and MedBridgeGO app access provided Education comprehension: verbalized understanding, returned demonstration, verbal cues required, and needs further education  HOME EXERCISE PROGRAM: Access Code: [email or web address] URL: [email or web address] Date: 07/09/2024 Prepared by: Elijah Hidden  Exercises - Seated Hip Abduction with Resistance  - 1 x daily - 7 x weekly - 3 sets - 10 reps - Seated Hip Adduction Isometrics with Mercer  -  1 x daily - 7 x weekly - 3 sets - 10 reps - Seated March with Resistance  - 1 x daily - 7 x weekly - 3 sets - 10 reps - Sit to Stand  - 1 x daily - 7 x weekly - 1 sets - 5 reps - Seated Hamstring Stretch  - 1-2 x daily - 7 x weekly - 3 reps - 30 sec hold - Seated Figure 4 Piriformis Stretch  - 1-2 x daily - 7 x weekly - 3 reps - 30 sec hold - Seated Hip Flexor Stretch  - 1-2 x daily - 7 x weekly - 3 reps - 30 sec hold  Patient Education - Check for Safety   ASSESSMENT:  CLINICAL IMPRESSION: Shyne arrived to PT with a new platform rollator.  Height adjustment was appropriate but cues necessary for increased proximity to walker to maintain upright posture.  Rollator brakes required adjustment to increase tension for more secure grip on wheels for safe transfers as well as better control on inclines. Education provided for safe transfers with rollator, ensuring brakes are locked and instructing patient to push from/reach for chair/seat during sit <> stand transition rather than pulling on walker.  Check for Safety - Home  Fall Prevention Checklist for Older Adults provide to and reviewed with patient to help identify fall risk hazards in the home along with strategies to reduce fall risk at home.  Remainder of session focusing on instruction in proximal LE stretches to help promote improved posture and stride length during gait.  Will plan to introduce PWR! Moves in upcoming visits to further address movement planning deficits associated with his Parkinsonism.  Alee will benefit from skilled PT to address ongoing flexibility, strength and balance deficits to improve mobility and activity tolerance with decreased pain interference and decreased risk for falls.    OBJECTIVE IMPAIRMENTS: Abnormal gait, decreased activity tolerance, decreased balance, decreased coordination, decreased endurance, decreased knowledge of condition, decreased knowledge of use of DME, decreased mobility, difficulty walking, decreased strength, decreased safety awareness, dizziness, impaired perceived functional ability, impaired flexibility, improper body mechanics, postural dysfunction, and pain.   ACTIVITY LIMITATIONS: carrying, lifting, bending, standing, squatting, stairs, transfers, bed mobility, reach over head, locomotion level, and caring for others  PARTICIPATION LIMITATIONS: meal prep, cleaning, laundry, shopping, and community activity  PERSONAL FACTORS: Age, Fitness, Past/current experiences, Time since onset of injury/illness/exacerbation, and 3+ comorbidities: Anxiety, asthma, bipolar affective disorder, COPD, depression, DM-II, DJD, chronic knee pain, B TKA, GERD, HTN, Mnire's disease, obesity, pancreatitis, h/o CVA, neurologic dysphagia, tardive dyskinesia, HOH - L deaf & R hearing aid are also affecting patient's functional outcome.   REHAB POTENTIAL: Good  CLINICAL DECISION MAKING: Unstable/unpredictable  EVALUATION COMPLEXITY: High   GOALS: Goals reviewed with patient? Yes  SHORT TERM GOALS: Target date:  07/30/2024  Patient will be independent with initial HEP. Baseline: To be established Goal status: IN PROGRESS - 07/05/24 - consistent with current HEP   2.  Patient will demonstrate decreased fall risk by scoring < 13.5 sec on normal TUG. Baseline: 15.84 sec w/o AD Goal status: INITIAL  3.  Patient will be educated on strategies to decrease risk of falls.  Baseline:  Goal status: MET - 07/09/24  LONG TERM GOALS: Target date: 09/10/2024  Patient will be independent with ongoing/advanced HEP for self-management at home incorporating PWR! Moves as indicated .  Baseline:  Goal status: INITIAL  2.  Patient will be able to ambulate 600' with LRAD with good safety to  access community.  Baseline:  Goal status: INITIAL  3.  Patient will be able to step up/down curb safely with LRAD for safety with community ambulation.  Baseline:  Goal status: INITIAL   4.  Patient will demonstrate gait speed of >/= 2.62 ft/sec (0.8 m/s) to be a safe limited community ambulator with decreased risk for recurrent falls.  Baseline: 1.80 ft/sec w/o AD, 1.98 ft/sec with SPC and 2.22 ft/sec with platform RW Goal status: INITIAL  5.  Patient will improve 5x STS time to </= 15 seconds w/o need for UE assist to demonstrate improved functional strength and transfer efficiency. Baseline: unable w/o UE assist - only able to complete 4 reps in 34.03 sec; 20.18 sec with B UE assist Goal status: INITIAL  6.  Patient will demonstrate at least 19/24 on DGI to improve gait stability and reduce risk for falls. Baseline: TBA Goal status: INITIAL  7.  Patient will improve Berg score by at least 8 points to improve safety and stability with ADLs in standing and reduce risk for falls. (MCID = 8 points)   Baseline: TBA Goal status: INITIAL  8.  Patient will report >/= 52% on ABC scale to demonstrate improved balance confidence and decreased risk for falls. Baseline: 620 / 1600 = 38.8 % Goal status: INITIAL  9. Patient  will verbalize understanding of local Parkinson's disease community resources, including community fitness post d/c. Baseline:  Goal status: INITIAL   PLAN:  PT FREQUENCY: 2x/week  PT DURATION: 12 weeks  PLANNED INTERVENTIONS: 97164- PT Re-evaluation, 97750- Physical Performance Testing, 97110-Therapeutic exercises, 97530- Therapeutic activity, [id number]- Neuromuscular re-education, 97535- Self Care, [id number]- Manual therapy, [id number]- Gait training, [phone]- Canalith repositioning, [id number]- Electrical stimulation (unattended), 97035- Ultrasound, 02966- Ionotophoresis 4mg /ml Dexamethasone , 79439 (1-2 muscles), 20561 (3+ muscles)- Dry Needling, Patient/Family education, Balance training, Stair training, Taping, Joint mobilization, Vestibular training, DME instructions, Cryotherapy, and Moist heat  PLAN FOR NEXT SESSION: Review and expand HEP for LE flexibility and core/LE strengthening PRN; continue LE strengthening; balance activities (both static and dynamic); initiate PWR! [address]    [place], [place] 07/09/2024, 4:15 PM    Date of referral: 05/31/24 Referring provider: Amon Aloysius BRAVO, MD Referring diagnosis?  G21.11,T43.505A (ICD-10-CM) - Neuroleptic-induced parkinsonism (HCC)  R26.9 (ICD-10-CM) - Gait disorder  R29.6 (ICD-10-CM) - Multiple falls   Treatment diagnosis? (if different than referring diagnosis)  Muscle weakness (generalized)  Unsteadiness on feet  Other abnormalities of gait and mobility  History of falling  What was this (referring dx) caused by? Ongoing Issue and Other: neurodegenerative disease  Nature of Condition: Chronic (continuous duration > 3 months)   Laterality: Both  Current Functional Measure Score: Other ABC Scale: 620 / 1600 = 38.8 %, indicating a low level of physical functioning (<69% indicates risk for recurrent falls in PD)    Objective measurements identify impairments when they are compared to normal values, the uninvolved extremity, and prior level of  function.  [x]  Yes  []  No  Objective assessment of functional ability: Moderate functional limitations   Briefly describe symptoms:  Patient first diagnosed with neuroleptic-induced Parkinsonism in 2013.  He reports falls were a major issue prior to starting carbidopa -levodopa , occurring on an almost daily basis and sometimes multiple time per day.  Since starting carbidopa -levodopa , he has not had any falls in the past 6 months but notes worsening LE weakness, endurance and activity tolerance over the past 6 months. Patient presents with physical impairments of decreased timing and coordination of gait, impaired ambulation,  impaired standing balance, abnormal posture, bradykinesia with transfers, impaired activity tolerance, LE weakness, postural instability and decreased safety awareness impacting safe and independent functional mobility.  Examination revealed patient is at risk for falls and functional decline as evidenced by the following objective test measures: 5xSTS of 20.18 sec with B UE assist (unable to complete 5 reps w/o UE assist despite repeated effort) (>15 sec indicates increased risk for falls and decreased BLE power), Gait speed 1.80 ft/sec w/o AD, 1.98 ft/sec with SPC and 2.22 ft/sec with platform RW (2.62 ft/sec is needed for community access and <1.8 ft/sec is indicative of risk for recurrent falls), TUG of 15.84 sec (>13.5 sec indicates increased risk for falls), TUG Manual of 15.19 sec (difference between TUG manual and TUG >4.5 seconds indicates increased fall risk), and TUG cognitive of 15.66 sec (>/= 15 seconds indicates high risk for falls and community dwelling older adults).  TUG scores of >10% difference indicate difficulty with dual tasking.  ABC scale score of 38.8% indicates a low level of physical functioning.    How did symptoms start: gradual onset  Average pain intensity:  Last 24 hours: 3/10  Past week: 5/10  How often does the pt experience symptoms?  Frequently  How much have the symptoms interfered with usual daily activities? Quite a bit  How has condition changed since care began at this facility? NA - initial visit  In general, how is the patients overall health? Fair  Onset date: Parkinsonism diagnosis in 2013, worsening weakness over the past 6 months    BACK PAIN (STarT Back Screening Tool) - (When applicable): N/A  Has your back pain spread down your leg(s) at sometime in the last 2 weeks? []  Yes   []  No Have you had pain in the shoulder or neck at sometime in the past 2 weeks? []  Yes   []  No Have you only walked short distances because of your back pain? []  Yes   []  No In the past 2 weeks, have you dressed more slowly than usual because of your back pain? []  Yes   []  No Do you think it is not really safe for person with a condition like yours to be physically active? []  Yes   []  No Have worrying thoughts been going through your mind a lot of the time? []  Yes   []  No Do you feel that your back pain is terrible and it is never going to get any better? []  Yes   []  No In general, have you stopped enjoying all the things you usually enjoy? []  Yes   []  No Overall, how bothersome has your back pain been in the last 2 weeks? []  Not at all   []  Slightly     []  Moderate   []  Very much     []  Extremely

## 2024-07-10 ENCOUNTER — Other Ambulatory Visit: Payer: Self-pay | Admitting: Internal Medicine

## 2024-07-11 DIAGNOSIS — J3089 Other allergic rhinitis: Secondary | ICD-10-CM | POA: Diagnosis not present

## 2024-07-11 DIAGNOSIS — J301 Allergic rhinitis due to pollen: Secondary | ICD-10-CM | POA: Diagnosis not present

## 2024-07-11 DIAGNOSIS — J3081 Allergic rhinitis due to animal (cat) (dog) hair and dander: Secondary | ICD-10-CM | POA: Diagnosis not present

## 2024-07-12 ENCOUNTER — Ambulatory Visit: Admitting: Physical Therapy

## 2024-07-12 ENCOUNTER — Encounter: Payer: Self-pay | Admitting: Physical Therapy

## 2024-07-12 DIAGNOSIS — R269 Unspecified abnormalities of gait and mobility: Secondary | ICD-10-CM | POA: Diagnosis not present

## 2024-07-12 DIAGNOSIS — R2689 Other abnormalities of gait and mobility: Secondary | ICD-10-CM | POA: Diagnosis not present

## 2024-07-12 DIAGNOSIS — M6281 Muscle weakness (generalized): Secondary | ICD-10-CM | POA: Diagnosis not present

## 2024-07-12 DIAGNOSIS — G2111 Neuroleptic induced parkinsonism: Secondary | ICD-10-CM | POA: Diagnosis not present

## 2024-07-12 DIAGNOSIS — T43505A Adverse effect of unspecified antipsychotics and neuroleptics, initial encounter: Secondary | ICD-10-CM | POA: Diagnosis not present

## 2024-07-12 DIAGNOSIS — Z9181 History of falling: Secondary | ICD-10-CM | POA: Diagnosis not present

## 2024-07-12 DIAGNOSIS — R2681 Unsteadiness on feet: Secondary | ICD-10-CM | POA: Diagnosis not present

## 2024-07-12 DIAGNOSIS — R296 Repeated falls: Secondary | ICD-10-CM | POA: Diagnosis not present

## 2024-07-12 NOTE — Therapy (Signed)
 OUTPATIENT PHYSICAL THERAPY [facility]   Patient Name: Ryan Zhang MRN: [id number] DOB:[birth year], 78 y.o., male Today's Date: 07/12/2024   END OF SESSION:  PT End of Session - 07/12/24 1400     Visit Number 5    Date for PT Re-Evaluation 09/10/24    Authorization Type [employer]    Authorization Time Period 06/18/24-07/16/24    Authorization - Visit Number 4    Authorization - Number of Visits 6    PT Start Time 1400    PT Stop Time 1447    PT Time Calculation (min) 47 min    Activity Tolerance Patient tolerated treatment well    Behavior During Therapy WFL for tasks assessed/performed             Past Medical History:  Diagnosis Date   Allergy    allergy shots Dr. Cloretta   Anemia    Anxiety    Asthma    moderate persistant   Bipolar affective (HCC)    Cataract    both eyes   Clotting disorder (HCC)    Due to blood thinner   COPD (chronic obstructive pulmonary disease) (HCC)    Depression    Diabetes mellitus    DJD (degenerative joint disease)    Dysphagia    Gallstones    hx of, s/p cholecystectomy   GERD (gastroesophageal reflux disease)    Hepatitis A    as teenager 60's   Hollenhorst plaque    right eye   Hyperlipidemia    Hypertension    Kidney stones    hx of   Meniere's disease    Motility disorder, esophageal    Obesity    Pancreatitis    Personal history of colonic polyps 11/2004   hyperplastic.   Stroke [facility])    per MRI   Past Surgical History:  Procedure Laterality Date   CATARACT EXTRACTION Bilateral 09/2015 and 10/2015   CHOLECYSTECTOMY     COLONOSCOPY     EYE SURGERY     JOINT REPLACEMENT     TOTAL KNEE ARTHROPLASTY Bilateral    both knees   Patient Active Problem List   Diagnosis Date Noted   Tinea cruris 07/24/2020   PCP NOTES >>>>>>>>>>>>>>>>>>>>>>>>>>>>>>> 01/12/2016   Folliculitis 05/09/2015   Hearing loss in right ear 08/05/2014   Panic attack as reaction to stress 04/20/2012   Dysphagia, neurologic  01/07/2012   Morbid obesity (HCC) 01/07/2012   Chest pain, musculoskeletal 12/07/2011   Candidiasis of skin 08/12/2011   Annual physical exam 06/07/2011   OLECRANON BURSITIS 01/29/2011   PARTIAL ARTERIAL OCCLUSION OF RETINA 05/27/2010   Essential hypertension, benign 01/16/2010   ABNORMAL ELECTROCARDIOGRAM 01/14/2010   Asthma 10/14/2009   GERD 10/14/2009   Bipolar disorder (HCC) 02/20/2009   PHIMOSIS 02/20/2009   KNEE PAIN, CHRONIC 11/20/2008   HIP PAIN, RIGHT 08/19/2008   DM type 2 with diabetic peripheral neuropathy (HCC) 02/22/2007   Hyperlipidemia associated with type 2 diabetes mellitus (HCC) 02/22/2007   ALLERGIC RHINITIS, SEASONAL 02/22/2007   LOW BACK PAIN, CHRONIC 02/22/2007   MENIERE'S DISEASE, HX OF 02/22/2007    PCP: Amon Aloysius BRAVO, MD   REFERRING PROVIDER: Amon Aloysius BRAVO, MD  (Neurologist - Evonnie Stabs, MD)  REFERRING DIAG:  [phone] (ICD-10-CM) - Neuroleptic-induced parkinsonism (HCC)  R26.9 (ICD-10-CM) - Gait disorder  R29.6 (ICD-10-CM) - Multiple falls   THERAPY DIAG:  Muscle weakness (generalized)  Unsteadiness on feet  Other abnormalities of gait and mobility  History of falling  RATIONALE FOR EVALUATION AND TREATMENT: Rehabilitation  ONSET DATE: ~2013 - 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  States he has been doing 3 sets of 15 reps daily.  Pt accompanied by: self  PAIN: Are you having pain? Yes: NPRS scale: 4/10 (when using arms)  Pain location: L shoulder, elbow, wrist, hand and thumb Pain description: sharp in shoulders, sometimes aching  Aggravating factors: being up and moving around  Relieving  factors: extra-strength Tylenol  when pain is really bad (mostly ignores the pain), when really bad, will take hydrocodone  to help him sleep   PERTINENT HISTORY:  Anxiety, asthma, bipolar affective disorder, COPD, depression, DM-II, DJD, chronic knee pain, B TKA, GERD, HTN, Mnire's disease, obesity, pancreatitis, h/o CVA, neurologic dysphagia, tardive dyskinesia, HOH - L deaf & R hearing aid  PRECAUTIONS: Fall  RED FLAGS: None  WEIGHT BEARING RESTRICTIONS: No  FALLS:  Has patient fallen in last 6 months? No and before the levodopa  supplements, falls were daily (sometimes several times a day)  LIVING ENVIRONMENT: Lives with: lives with their spouse and dog Lives in: Apartment Stairs: No Has following equipment at home: Single point cane, Environmental consultant - 2 wheeled, shower chair, and Grab bars (platform RW)  OCCUPATION: Retired  PLOF: Independent, Independent with community mobility with device, and Leisure: watching TV, listening to books, programming, walking ~15 min (around the block) - tries to do daily   PATIENT GOALS: To get back to the point where I can walk to the car unassisted (no AD) and use platform RW less in community.  Be able to assist my wife with bringing groceries in from the car.   OBJECTIVE: (objective measures completed at initial evaluation unless otherwise dated)  DIAGNOSTIC FINDINGS:  N/A - Multiple imaging studies in 2024 s/p falls but no acute fractures.  COGNITION: Overall cognitive status: Impaired and History of cognitive impairments - at baseline - mostly memory issues resulting from ECT 19 yrs ago, better over last 3 months but still notes limited STM   SENSATION: WFL  COORDINATION: B Heel/toe WFL Heel to shin unable bilaterally   POSTURE:  rounded shoulders, forward head, and flexed trunk   MUSCLE LENGTH: Hamstrings: mod/severe tight B Piriformis: mild /mod tight  R>L Hip flexors: mod tight   LOWER EXTREMITY ROM:    Grossly WFL except  limited hip extension and rotation bilaterally  LOWER EXTREMITY MMT:    MMT Right eval Left eval  Hip flexion 3+ 3+  Hip extension 3+ 3+  Hip abduction 3- 3  Hip adduction 3+ 3+  Hip internal rotation 4- 3+  Hip external rotation 4- 3+  Knee flexion 4 4  Knee extension 4 4  Ankle dorsiflexion 4- 4-  Ankle plantarflexion    Ankle inversion    Ankle eversion    (Blank rows = not tested)  BED MOBILITY:  SBA  TRANSFERS: Assistive device utilized: None  Sit to stand: Modified independence and SBA Stand to sit: Modified independence and SBA Chair to chair: SBA Floor: NT  GAIT: Distance walked: clinic distances Assistive device utilized: Single point cane, platform RW/2WW, and None Level of assistance: SBA Gait pattern: step through pattern, decreased arm swing- Right, decreased arm swing- Left, decreased stride length, decreased hip/knee flexion- Right, decreased hip/knee flexion- Left, decreased ankle dorsiflexion- Right, decreased ankle dorsiflexion- Left, shuffling, festinating, trunk flexed, poor foot clearance- Right, and poor foot clearance- Left Comments: gait deviations more pronounced with decreasing AD support  FUNCTIONAL TESTS:  5 times sit to stand: unable w/o UE assist - only able to complete 4 reps in 34.03 sec; 20.18 sec with B UE assist Timed up and go (TUG): Normal - 15.84 sec w/o AD, Manual - 15.19 sec w/o AD, Cognitive - 15.66 sec w/o AD 10 meter walk test: 18.25 sec w/o AD, 16.56 sec with SPC, 14.78 sec with platform RW Gait speed: 1.80 ft/sec w/o AD, 1.98 ft/sec with SPC, 2.22 ft/sec with platform RW Berg balance scale: TBA DGI: TBA  PATIENT SURVEYS:  ABC scale: 620 / 1600 = 38.8 %, indicating a low level of physical functioning (<69% indicates risk for recurrent falls in PD)   TODAY'S TREATMENT:   07/12/2024  THERAPEUTIC EXERCISE: To improve strength, endurance, ROM, and flexibility.  Demonstration, verbal and tactile cues throughout for technique.   NuStep - L3 x 6 min (LE only) - resistance reduced d/t c/o fatigue Seated hip hinge HS stretch 3 x 30 bil Seated hip hinge figure-4 piriformis stretch 3 x 30 bil Seated hip flexor stretch in lunge position over edge of seat 3 x 30 bil  NEUROMUSCULAR RE-EDUCATION: To improve coordination, kinesthesia, posture, proprioception, reduce fall risk, amplitude of movement, speed of movement to reduce bradykinesia, and reduce rigidity. Seated PWR! Moves: Up x 10 Rock x 5 bil Twist - deferred d/t increased shoulder pain Step x 5 bil Seated scapular retraction into pool noodle along spine and back of chair 10 x 3 Seated GTB scapular retraction + B shoulder row 10 x 3 Seated GTB scapular retraction + B shoulder extension 10 x 3   07/09/2024  THERAPEUTIC EXERCISE: To improve strength, endurance, and flexibility.  Demonstration, verbal and tactile cues throughout for technique.  NuStep - L4 x 6 min (x 2' with UE/LE, remaining 4' just LE) Seated hip hinge HS stretch 2 x 30 bil Seated hip hinge figure-4 piriformis stretch 2 x 30 bil Seated hip flexor stretch in lunge position over edge of seat 2 x 30 bil  SELF CARE: Provided education to improve safety with use of platform rollator for assistive device.  Assess platform rollator height, reinforcing upright posture and as close proximity as seat will allow. Adjusted tension on rollator brakes to increase brake engagement against wheel to secure walker  during transfers and allow for better control on inclines.  Provided instruction in safe transfer techniques with rollator, ensuring brakes are locked and pushing from/reaching for chair/seat during sit <> stand transition rather than pulling on walker. Reviewed the Check for Safety - Home Fall Prevention Checklist for Older Adults to help identify fall risk hazards in the home along with strategies to reduce fall risk at home.   07/05/2024 THERAPEUTIC EXERCISE: To improve strength, endurance,  and ROM.  Demonstration, verbal and tactile cues throughout for technique.   Gait 300' w/ platform walker  Hooklying Bridges 2x10  S/L Clamshells 2x10 B- L side feels tighter/more difficult  Seated hip add w/ ball squeeze 2x10, 3  Seated hip abd RTB 2x10  Seated Marches x10 B  Alt Seated Marches x20 B  STS from chair x10- fatigue noted by rep 6   NEURO RE-EDUCATION: Focusing on balance, upright posture  Side Stepping 3x10' Fwd Weight Shifts- cued to place weight into leading foot and come up onto heel of opposite foot  Fwd Stepping over foam roll w/ counter support x10  Lateral Stepping over foam roll w/ counter support x10 B  Standing hip abd w/ counter support x10 B- cued to maintain movement w/o letting feet slap the ground  Standing hip ext w/ counter support  x10 B Toe Taps onto shoe box 2x10 B- cued to gently tap the box to ensure controlled motion    06/26/24 Seated hip ADD ball squeeze 10x3; 2 sets Seated hip ABD RTB 2x10 Seated marching RTB 2x10  NEUROMUSCULAR RE-EDUCATION: To improve coordination, kinesthesia, posture, and proprioception.   Standardized Balance Assessment   Standardized Balance Assessment Berg Balance Test;Dynamic Gait Index      Berg Balance Test   Sit to Stand Able to stand  independently using hands    Standing Unsupported Able to stand safely 2 minutes    Sitting with Back Unsupported but Feet Supported on Floor or Stool Able to sit safely and securely 2 minutes    Stand to Sit Sits safely with minimal use of hands    Transfers Able to transfer safely, minor use of hands    Standing Unsupported with Eyes Closed Able to stand 10 seconds safely    Standing Unsupported with Feet Together Able to place feet together independently and stand 1 minute safely    From Standing, Reach Forward with Outstretched Arm Can reach confidently >25 cm (10)    From Standing Position, Pick up Object from Floor Able to pick up shoe safely and easily    From Standing  Position, Turn to Look Behind Over each Shoulder Looks behind from both sides and weight shifts well    Turn 360 Degrees Able to turn 360 degrees safely but slowly    Standing Unsupported, Alternately Place Feet on Step/Stool Needs assistance to keep from falling or unable to try    Standing Unsupported, One Foot in [employer] balance while stepping or standing    Standing on One Leg Unable to try or needs assist to prevent fall    Total Score 41      Dynamic Gait Index   Level Surface Moderate Impairment    Change in Gait Speed Mild Impairment    Gait with Horizontal Head Turns Moderate Impairment    Gait with Vertical Head Turns Mild Impairment    Gait and Pivot Turn Mild Impairment    Step Over Obstacle Moderate Impairment    Step Around Obstacles Mild Impairment  Steps Moderate Impairment    Total Score 12       06/18/2024  SELF CARE:  Reviewed eval findings and role of PT in addressing identified deficits as well as need for further assessment of balance and related fall risk.    PATIENT EDUCATION:  Education details: HEP update - proximal LE stretches, transfer safety, gait safety with platform rollator, and Check for Safety - Home Fall Prevention Checklist for Older Adults   Person educated: Patient Education method: Explanation, Demonstration, Verbal cues, Handouts, and MedBridge[employer] app access provided Education comprehension: verbalized understanding, returned demonstration, verbal cues required, and needs further education  HOME EXERCISE PROGRAM: Access Code: [email or web address] URL: [email or web address] Date: 07/12/2024 Prepared by: Elijah Hidden  Exercises - Seated Hip Abduction with Resistance  - 1 x daily - 3 x weekly - 3 sets - 10 reps - Seated Hip Adduction Isometrics with Ball  - 1 x daily - 3 x weekly - 3 sets - 10 reps - Seated March with Resistance  - 1 x daily - 3 x weekly - 3 sets - 10 reps - Sit to Stand  - 1 x daily - 3 x weekly - 1 sets - 5 reps -  Seated Hamstring Stretch  - 1-2 x daily - 7 x weekly - 3 reps - 30 sec hold - Seated Figure 4 Piriformis Stretch  - 1-2 x daily - 7 x weekly - 3 reps - 30 sec hold - Seated Hip Flexor Stretch  - 1-2 x daily - 7 x weekly - 3 reps - 30 sec hold  Patient Education - Check for Safety   ASSESSMENT:  CLINICAL IMPRESSION: Reakwon reports increased fatigue and muscle soreness from HEP over-performance, therefore reviewed recommended frequency of HEP aiming for ~daily stretching but every other day or 3x/wk with strengthening exercises for now to reduce fatigue and overuse irritation.  Reviewed LE stretches as patient reports he has not tried these at home yet, clarifying intensity of hold and hold duration.  He denies need for review of initial strengthening exercises other than clarification of frequency as above.  Introduced seated PWR! Moves, but had to defer PWR! Twist due to shoulder discomfort and limit repetitions on PWR! Rock and step to 5 instead of 10.  Patient demonstrates very rounded shoulder posture with protracted scapula bilaterally, likely exacerbated by use of platform RW and 4WW, there focused remainder of session on postural correction and strengthening with activation of scapular muscles with patient noting some relief of shoulder discomfort during these exercises.  Despite increased soreness and fatigue from exercises Khalel does feel like he has been progressing with PT.  He will benefit from continued skilled PT to address ongoing flexibility, strength and balance deficits to improve mobility and activity tolerance with decreased pain interference and decreased risk for falls.    OBJECTIVE IMPAIRMENTS: Abnormal gait, decreased activity tolerance, decreased balance, decreased coordination, decreased endurance, decreased knowledge of condition, decreased knowledge of use of DME, decreased mobility, difficulty walking, decreased strength, decreased safety awareness, dizziness, impaired  perceived functional ability, impaired flexibility, improper body mechanics, postural dysfunction, and pain.   ACTIVITY LIMITATIONS: carrying, lifting, bending, standing, squatting, stairs, transfers, bed mobility, reach over head, locomotion level, and caring for others  PARTICIPATION LIMITATIONS: meal prep, cleaning, laundry, shopping, and community activity  PERSONAL FACTORS: Age, Fitness, Past/current experiences, Time since onset of injury/illness/exacerbation, and 3+ comorbidities: Anxiety, asthma, bipolar affective disorder, COPD, depression, DM-II, DJD, chronic knee pain, B TKA, GERD, HTN, Mnire's disease, obesity, pancreatitis,  h/o CVA, neurologic dysphagia, tardive dyskinesia, HOH - L deaf & R hearing aid are also affecting patient's functional outcome.   REHAB POTENTIAL: Good  CLINICAL DECISION MAKING: Unstable/unpredictable  EVALUATION COMPLEXITY: High   GOALS: Goals reviewed with patient? Yes  SHORT TERM GOALS: Target date: 07/30/2024  Patient will be independent with initial HEP. Baseline: To be established 07/05/24 - consistent with current HEP  Goal status: IN PROGRESS - 07/12/24 - clarified HEP frequency  2.  Patient will demonstrate decreased fall risk by scoring < 13.5 sec on normal TUG. Baseline: 15.84 sec w/o AD Goal status: INITIAL  3.  Patient will be educated on strategies to decrease risk of falls.  Baseline:  Goal status: MET - 07/09/24  LONG TERM GOALS: Target date: 09/10/2024  Patient will be independent with ongoing/advanced HEP for self-management at home incorporating PWR! Moves as indicated .  Baseline:  Goal status: INITIAL  2.  Patient will be able to ambulate 600' with LRAD with good safety to access community.  Baseline:  Goal status: INITIAL  3.  Patient will be able to step up/down curb safely with LRAD for safety with community ambulation.  Baseline:  Goal status: INITIAL   4.  Patient will demonstrate gait speed of >/= 2.62 ft/sec  (0.8 m/s) to be a safe limited community ambulator with decreased risk for recurrent falls.  Baseline: 1.80 ft/sec w/o AD, 1.98 ft/sec with SPC and 2.22 ft/sec with platform RW Goal status: INITIAL  5.  Patient will improve 5x STS time to </= 15 seconds w/o need for UE assist to demonstrate improved functional strength and transfer efficiency. Baseline: unable w/o UE assist - only able to complete 4 reps in 34.03 sec; 20.18 sec with B UE assist Goal status: INITIAL  6.  Patient will demonstrate at least 19/24 on DGI to improve gait stability and reduce risk for falls. Baseline: TBA Goal status: INITIAL  7.  Patient will improve Berg score by at least 8 points to improve safety and stability with ADLs in standing and reduce risk for falls. (MCID = 8 points)   Baseline: TBA Goal status: INITIAL  8.  Patient will report >/= 52% on ABC scale to demonstrate improved balance confidence and decreased risk for falls. Baseline: 620 / 1600 = 38.8 % Goal status: INITIAL  9. Patient will verbalize understanding of local Parkinson's disease community resources, including community fitness post d/c. Baseline:  Goal status: INITIAL   PLAN:  PT FREQUENCY: 2x/week  PT DURATION: 12 weeks  PLANNED INTERVENTIONS: [id number]- PT Re-evaluation, 97750- Physical Performance Testing, 97110-Therapeutic exercises, 97530- Therapeutic activity, [id number]- Neuromuscular re-education, 97535- Self Care, [id number]- Manual therapy, [id number]- Gait training, [phone]- Canalith repositioning, [id number]- Electrical stimulation (unattended), 97035- Ultrasound, 02966- Ionotophoresis 4mg /ml Dexamethasone , 79439 (1-2 muscles), 20561 (3+ muscles)- Dry Needling, Patient/Family education, Balance training, Stair training, Taping, Joint mobilization, Vestibular training, DME instructions, Cryotherapy, and Moist heat  PLAN FOR NEXT SESSION: Review and expand HEP for LE flexibility and core/LE strengthening PRN; continue LE strengthening; balance  activities (both static and dynamic); initiate PWR! [address]    [place], [place] 07/12/2024, 4:02 PM    Date of referral: 05/31/24 Referring provider: Amon Aloysius BRAVO, MD Referring diagnosis?  G21.11,T43.505A (ICD-10-CM) - Neuroleptic-induced parkinsonism (HCC)  R26.9 (ICD-10-CM) - Gait disorder  R29.6 (ICD-10-CM) - Multiple falls   Treatment diagnosis? (if different than referring diagnosis)  Muscle weakness (generalized)  Unsteadiness on feet  Other abnormalities of gait and mobility  History of falling  What was  this (referring dx) caused by? Ongoing Issue and Other: neurodegenerative disease  Nature of Condition: Chronic (continuous duration > 3 months)   Laterality: Both  Current Functional Measure Score: Other ABC Scale: 620 / 1600 = 38.8 %, indicating a low level of physical functioning (<69% indicates risk for recurrent falls in PD)    Objective measurements identify impairments when they are compared to normal values, the uninvolved extremity, and prior level of function.  [x]  Yes  []  No  Objective assessment of functional ability: Moderate functional limitations   Briefly describe symptoms:  Patient feels like he is progressing with PT but may have been overdoing HEP resulting in increased fatigue and muscle soreness, hence perceived slight worsening of condition since onset of therapy.  He continues to demonstrate abnormal posture, decreased flexibility, impaired strength, and impaired balance with high risk for falls indicating need for continued PT.  How did symptoms start: gradual onset  Average pain intensity:  Last 24 hours: 3.5-6.5/10  Past week: 3.5-6.5/10  How often does the pt experience symptoms? Frequently  How much have the symptoms interfered with usual daily activities? Quite a bit  How has condition changed since care began at this facility? A little worse - sore from exercises  In general, how is the patients overall health? Fair  Onset date:  Parkinsonism diagnosis in 2013, worsening weakness over the past 6 months    BACK PAIN (STarT Back Screening Tool) - (When applicable): N/A  Has your back pain spread down your leg(s) at sometime in the last 2 weeks? []  Yes   []  No Have you had pain in the shoulder or neck at sometime in the past 2 weeks? []  Yes   []  No Have you only walked short distances because of your back pain? []  Yes   []  No In the past 2 weeks, have you dressed more slowly than usual because of your back pain? []  Yes   []  No Do you think it is not really safe for person with a condition like yours to be physically active? []  Yes   []  No Have worrying thoughts been going through your mind a lot of the time? []  Yes   []  No Do you feel that your back pain is terrible and it is never going to get any better? []  Yes   []  No In general, have you stopped enjoying all the things you usually enjoy? []  Yes   []  No Overall, how bothersome has your back pain been in the last 2 weeks? []  Not at all   []  Slightly     []  Moderate   []  Very much     []  Extremely

## 2024-07-13 NOTE — Progress Notes (Signed)
 Assessment/Plan:   1.  Neuroleptic induced parkinsonism  -As when I saw the patient back in 2015, he continues to have secondary parkinsonism secondary to Saphris , only it has progressed since that time.  We told the patient back in 2015 that symptoms certainly could progress if unable to get off the medication, which it did. -DaTscan done in 2014 was unremarkable. -Levodopa  was started in August, 2024.  Patient does think that levodopa  has helped his parkinsonism significantly but is a bit more imbalanced.  Unfortunately, since the saphris  is the ultimate source of the parkinsonism, pt would have to address that with psychiatry.  He does state that he does not think of anything can be done with the Saphris . -Patient is still on Saphris  for bipolar    2.  Tardive dyskinesia             -This really is in the form of oral buccal lingual dyskinesia and started before levodopa .  This is certainly from Saphris .  It is mild     3.  Long history of Mnire's disease with associated vertigo             -Well-known to patient and diagnosed and apparently 41.  4.  Bipolar disorder  -On Saphris , which is the etiology of #1. Subjective:   Ryan Zhang was seen today in follow up for secondary parkinsonism.  My previous records were reviewed prior to todays visit as well as outside records available to me. Pt is now on both levodopa  as well as continues on his Saphris .  He has not had hallucinations.  He thinks levodopa  helps.  He has been to physical therapy since our last visit.   He has had 1 fall; he was getting OOB and his legs just collapsed.  He didn't have his walker.  He is using an upright walker currently that he got on [employer] and that seems to be working well.  No hallucinations.    Current prescribed movement disorder medications: carbidopa /levodopa  25/100 po tid   PREVIOUS MEDICATIONS: Sinemet   ALLERGIES:   Allergies  Allergen Reactions   Celexa  [Citalopram Hydrobromide]  Other (See Comments)   Depakote  [Divalproex Sodium] Other (See Comments)   Methocarbamol     Other reaction(s): Hallucinations   Paroxetine Hcl     Other reaction(s): Insomnia   Smz-Tmp Ds [Sulfamethoxazole-Trimethoprim] Nausea And Vomiting   Other     Symbrax alteration in blood sugar, pt unsure if low or high blood sugar   Oxycodone-Acetaminophen      Other reaction(s): Unknown   Risperidone Other (See Comments)    REACTION: hyper    CURRENT MEDICATIONS:  Current Meds  Medication Sig   albuterol  (PROVENTIL ) (2.5 MG/3ML) 0.083% nebulizer solution TAKE (2.5MG  TOTAL) BY NEBULIZATION EVERY 6 HOURS AS NEEDED FOR WHEEZING OR SHORTNESS OF BREATH   albuterol  (VENTOLIN  HFA) 108 (90 Base) MCG/ACT inhaler Inhale 2 puffs into the lungs every 6 (six) hours as needed for wheezing or shortness of breath.   asenapine  (SAPHRIS ) 5 MG SUBL 24 hr tablet Place 5 mg under the tongue at bedtime.   Blood Glucose Monitoring Suppl (ONETOUCH VERIO REFLECT) w/Device KIT Check blood sugars 1-2 times daily   budesonide -formoterol (SYMBICORT) 160-4.5 MCG/ACT inhaler Inhale 2 puffs into the lungs 2 (two) times daily.    buPROPion (WELLBUTRIN XL) 150 MG 24 hr tablet Take 150 mg by mouth daily. (Patient taking differently: Take 300 mg by mouth daily.)   cholecalciferol (VITAMIN D3) 25  MCG (1000 UNIT) tablet Take 1,000 Units by mouth daily.   clopidogrel  (PLAVIX ) 75 MG tablet Take 1 tablet (75 mg total) by mouth daily.   DULoxetine  (CYMBALTA ) 60 MG capsule Take 60 mg by mouth every morning.   fexofenadine (ALLEGRA) 180 MG tablet Take 180 mg by mouth every morning. 8am Marlo Smock)   fluticasone  (FLONASE ) 50 MCG/ACT nasal spray    lamoTRIgine (LAMICTAL) 25 MG tablet Take 50 mg by mouth at bedtime.   Lancets (ONETOUCH ULTRASOFT) lancets Check blood sugars no more than twice daily   losartan  (COZAAR ) 25 MG tablet Take 1 tablet (25 mg total) by mouth every evening.   montelukast (SINGULAIR) 10 MG tablet Take 10 mg  by mouth at bedtime. [place]   Multiple Vitamin (MULTIVITAMIN) tablet Take 1 tablet by mouth daily. [employer] senior   naproxen  (NAPROSYN ) 500 MG tablet Take 500 mg by mouth daily as needed.   nystatin  (MYCOSTATIN /NYSTOP ) powder Apply 1 Application topically 3 (three) times daily.   ONETOUCH ULTRA test strip USE TO TEST GLUCOSE LEVELS 2 TIMES DAILY   pantoprazole  (PROTONIX ) 40 MG tablet Take 1 tablet (40 mg total) by mouth daily before breakfast.   pioglitazone -metformin  (ACTOPLUS MET ) 15-850 MG tablet TAKE ONE TABLET BY MOUTH TWICE DAILY WITH A MEAL   simvastatin  (ZOCOR ) 40 MG tablet Take 1 tablet (40 mg total) by mouth at bedtime.   solifenacin  (VESICARE ) 10 MG tablet Take 1 tablet (10 mg total) by mouth daily.   [DISCONTINUED] carbidopa -levodopa  (SINEMET  IR) 25-100 MG tablet TAKE ONE TABLET BY MOUTH THREE TIMES DAILY AT 7AM, 11AM, AND 4PM   [DISCONTINUED] carbidopa -levodopa  (SINEMET  IR) 25-100 MG tablet Take 1 tablet by mouth 3 (three) times daily.     Objective:   PHYSICAL EXAMINATION:    VITALS:   Vitals:   07/17/24 1343  BP: 118/82  Pulse: 80  SpO2: 95%  Weight: 214 lb 6.4 oz (97.3 kg)  Height: 5' 10 (1.778 m)     GEN:  The patient appears stated age and is in NAD. HEENT:  Normocephalic, atraumatic.  The mucous membranes are moist. The superficial temporal arteries are without ropiness or tenderness. CV:  RRR Lungs:  CTAB Neck/HEME:  There are no carotid bruits bilaterally.  Neurological examination:  Orientation: The patient is alert and oriented x3. Cranial nerves: There is good facial symmetry with min facial hypomimia.  This is stable.  The speech is fluent and clear. Soft palate rises symmetrically and there is no tongue deviation. Hearing is intact to conversational tone. Sensation: Sensation is intact to light touch throughout Motor: Strength is at least antigravity x4.  Movement examination: Tone: There is nl tone in the UE/LE Abnormal movements:  none Coordination:  There is mild decremation with toe taps on the right.  All other rapid alternating movements are normal. Gait and Station: The patient pushes off to arise.  He uses his upright walker.  He is slightly short stepped, but generally walks well with it.  I have reviewed and interpreted the following labs independently    Chemistry      Component Value Date/Time   NA 141 05/14/2024 1448   K 4.0 05/14/2024 1448   CL 105 05/14/2024 1448   CO2 23 05/14/2024 1448   BUN 17 05/14/2024 1448   CREATININE 0.80 05/14/2024 1448   CREATININE 1.01 12/25/2021 1620      Component Value Date/Time   CALCIUM 9.1 05/14/2024 1448   ALKPHOS 58 01/29/2023 1751   AST 13 01/10/2024  1351   ALT 6 01/10/2024 1351   BILITOT 1.0 01/29/2023 1751       Lab Results  Component Value Date   WBC 10.7 (H) 05/14/2024   HGB 11.0 (L) 05/14/2024   HCT 36.1 (L) 05/14/2024   MCV 79.5 05/14/2024   PLT 406.0 (H) 05/14/2024    Lab Results  Component Value Date   TSH 1.86 09/14/2021     Cc:  Amon Aloysius BRAVO, MD

## 2024-07-17 ENCOUNTER — Encounter: Admitting: Physical Therapy

## 2024-07-17 ENCOUNTER — Encounter: Payer: Self-pay | Admitting: Neurology

## 2024-07-17 ENCOUNTER — Ambulatory Visit: Payer: Medicare Other | Admitting: Neurology

## 2024-07-17 VITALS — BP 118/82 | HR 80 | Ht 70.0 in | Wt 214.4 lb

## 2024-07-17 DIAGNOSIS — F3160 Bipolar disorder, current episode mixed, unspecified: Secondary | ICD-10-CM

## 2024-07-17 DIAGNOSIS — G2111 Neuroleptic induced parkinsonism: Secondary | ICD-10-CM | POA: Diagnosis not present

## 2024-07-17 DIAGNOSIS — J3089 Other allergic rhinitis: Secondary | ICD-10-CM | POA: Diagnosis not present

## 2024-07-17 DIAGNOSIS — G2401 Drug induced subacute dyskinesia: Secondary | ICD-10-CM

## 2024-07-17 DIAGNOSIS — J301 Allergic rhinitis due to pollen: Secondary | ICD-10-CM | POA: Diagnosis not present

## 2024-07-17 DIAGNOSIS — J3081 Allergic rhinitis due to animal (cat) (dog) hair and dander: Secondary | ICD-10-CM | POA: Diagnosis not present

## 2024-07-17 DIAGNOSIS — T43505A Adverse effect of unspecified antipsychotics and neuroleptics, initial encounter: Secondary | ICD-10-CM | POA: Diagnosis not present

## 2024-07-17 MED ORDER — CARBIDOPA-LEVODOPA 25-100 MG PO TABS
1.0000 | ORAL_TABLET | Freq: Three times a day (TID) | ORAL | 2 refills | Status: AC
Start: 2024-07-17 — End: ?

## 2024-07-17 NOTE — Patient Instructions (Signed)

## 2024-07-19 ENCOUNTER — Ambulatory Visit: Attending: Internal Medicine

## 2024-07-19 DIAGNOSIS — Z9181 History of falling: Secondary | ICD-10-CM | POA: Diagnosis not present

## 2024-07-19 DIAGNOSIS — R2689 Other abnormalities of gait and mobility: Secondary | ICD-10-CM | POA: Diagnosis not present

## 2024-07-19 DIAGNOSIS — M6281 Muscle weakness (generalized): Secondary | ICD-10-CM | POA: Insufficient documentation

## 2024-07-19 DIAGNOSIS — R2681 Unsteadiness on feet: Secondary | ICD-10-CM | POA: Insufficient documentation

## 2024-07-19 NOTE — Therapy (Signed)
 OUTPATIENT PHYSICAL THERAPY [facility]   Patient Name: Ryan Zhang MRN: [id number] DOB:04-04-1946, 78 y.o., male Today's Date: 07/19/2024   END OF SESSION:  PT End of Session - 07/19/24 1146     Visit Number 6    Date for PT Re-Evaluation 09/10/24    Authorization Type [employer] Medicare    Authorization Time Period 07/19/24- 08/16/24    Authorization - Visit Number 1    Authorization - Number of Visits 6    PT Start Time 1100    PT Stop Time 1144    PT Time Calculation (min) 44 min    Activity Tolerance Patient tolerated treatment well    Behavior During Therapy WFL for tasks assessed/performed              Past Medical History:  Diagnosis Date   Allergy    allergy shots Dr. Cloretta   Anemia    Anxiety    Asthma    moderate persistant   Bipolar affective (HCC)    Cataract    both eyes   Clotting disorder (HCC)    Due to blood thinner   COPD (chronic obstructive pulmonary disease) (HCC)    Depression    Diabetes mellitus    DJD (degenerative joint disease)    Dysphagia    Gallstones    hx of, s/p cholecystectomy   GERD (gastroesophageal reflux disease)    Hepatitis A    as teenager 60's   Hollenhorst plaque    right eye   Hyperlipidemia    Hypertension    Kidney stones    hx of   Meniere's disease    Motility disorder, esophageal    Obesity    Pancreatitis    Personal history of colonic polyps 11/2004   hyperplastic.   Stroke [facility])    per MRI   Past Surgical History:  Procedure Laterality Date   CATARACT EXTRACTION Bilateral 09/2015 and 10/2015   CHOLECYSTECTOMY     COLONOSCOPY     EYE SURGERY     JOINT REPLACEMENT     TOTAL KNEE ARTHROPLASTY Bilateral    both knees   Patient Active Problem List   Diagnosis Date Noted   Tinea cruris 07/24/2020   PCP NOTES >>>>>>>>>>>>>>>>>>>>>>>>>>>>>>> 01/12/2016   Folliculitis 05/09/2015   Hearing loss in right ear 08/05/2014   Panic attack as reaction to stress 04/20/2012   Dysphagia,  neurologic 01/07/2012   Morbid obesity (HCC) 01/07/2012   Chest pain, musculoskeletal 12/07/2011   Candidiasis of skin 08/12/2011   Annual physical exam 06/07/2011   OLECRANON BURSITIS 01/29/2011   PARTIAL ARTERIAL OCCLUSION OF RETINA 05/27/2010   Essential hypertension, benign 01/16/2010   ABNORMAL ELECTROCARDIOGRAM 01/14/2010   Asthma 10/14/2009   GERD 10/14/2009   Bipolar disorder (HCC) 02/20/2009   PHIMOSIS 02/20/2009   KNEE PAIN, CHRONIC 11/20/2008   HIP PAIN, RIGHT 08/19/2008   DM type 2 with diabetic peripheral neuropathy (HCC) 02/22/2007   Hyperlipidemia associated with type 2 diabetes mellitus (HCC) 02/22/2007   ALLERGIC RHINITIS, SEASONAL 02/22/2007   LOW BACK PAIN, CHRONIC 02/22/2007   MENIERE'S DISEASE, HX OF 02/22/2007    PCP: Amon Aloysius BRAVO, MD   REFERRING PROVIDER: Amon Aloysius BRAVO, MD  (Neurologist - Evonnie Stabs, MD)  REFERRING DIAG:  [phone] (ICD-10-CM) - Neuroleptic-induced parkinsonism (HCC)  R26.9 (ICD-10-CM) - Gait disorder  R29.6 (ICD-10-CM) - Multiple falls   THERAPY DIAG:  Muscle weakness (generalized)  Unsteadiness on feet  Other abnormalities of gait and mobility  History of  falling  RATIONALE FOR EVALUATION AND TREATMENT: Rehabilitation  ONSET DATE: ~2013 - 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   Pt accompanied by: self  PAIN: Are you having pain? Yes: NPRS scale: 4/10 (when using arms)  Pain location: L shoulder, elbow, wrist, hand and thumb Pain description: sharp in shoulders, sometimes aching  Aggravating factors: being up and moving around  Relieving factors:  extra-strength Tylenol  when pain is really bad (mostly ignores the pain), when really bad, will take hydrocodone  to help him sleep   PERTINENT HISTORY:  Anxiety, asthma, bipolar affective disorder, COPD, depression, DM-II, DJD, chronic knee pain, B TKA, GERD, HTN, Mnire's disease, obesity, pancreatitis, h/o CVA, neurologic dysphagia, tardive dyskinesia, HOH - L deaf & R hearing aid  PRECAUTIONS: Fall  RED FLAGS: None  WEIGHT BEARING RESTRICTIONS: No  FALLS:  Has patient fallen in last 6 months? No and before the levodopa  supplements, falls were daily (sometimes several times a day)  LIVING ENVIRONMENT: Lives with: lives with their spouse and dog Lives in: Apartment Stairs: No Has following equipment at home: Single point cane, Environmental consultant - 2 wheeled, shower chair, and Grab bars (platform RW)  OCCUPATION: Retired  PLOF: Independent, Independent with community mobility with device, and Leisure: watching TV, listening to books, programming, walking ~15 min (around the block) - tries to do daily   PATIENT GOALS: To get back to the point where I can walk to the car unassisted (no AD) and use platform RW less in community.  Be able to assist my wife with bringing groceries in from the car.   OBJECTIVE: (objective measures completed at initial evaluation unless otherwise dated)  DIAGNOSTIC FINDINGS:  N/A - Multiple imaging studies in 2024 s/p falls but no acute fractures.  COGNITION: Overall cognitive status: Impaired and History of cognitive impairments - at baseline - mostly memory issues resulting from ECT 19 yrs ago, better over last 3 months but still notes limited STM   SENSATION: WFL  COORDINATION: B Heel/toe WFL Heel to shin unable bilaterally   POSTURE:  rounded shoulders, forward head, and flexed trunk   MUSCLE LENGTH: Hamstrings: mod/severe tight B Piriformis: mild /mod tight R>L Hip flexors: mod  tight   LOWER EXTREMITY ROM:    Grossly WFL except limited hip  extension and rotation bilaterally  LOWER EXTREMITY MMT:    MMT Right eval Left eval  Hip flexion 3+ 3+  Hip extension 3+ 3+  Hip abduction 3- 3  Hip adduction 3+ 3+  Hip internal rotation 4- 3+  Hip external rotation 4- 3+  Knee flexion 4 4  Knee extension 4 4  Ankle dorsiflexion 4- 4-  Ankle plantarflexion    Ankle inversion    Ankle eversion    (Blank rows = not tested)  BED MOBILITY:  SBA  TRANSFERS: Assistive device utilized: None  Sit to stand: Modified independence and SBA Stand to sit: Modified independence and SBA Chair to chair: SBA Floor: NT  GAIT: Distance walked: clinic distances Assistive device utilized: Single point cane, platform RW/2WW, and None Level of assistance: SBA Gait pattern: step through pattern, decreased arm swing- Right, decreased arm swing- Left, decreased stride length, decreased hip/knee flexion- Right, decreased hip/knee flexion- Left, decreased ankle dorsiflexion- Right, decreased ankle dorsiflexion- Left, shuffling, festinating, trunk flexed, poor foot clearance- Right, and poor foot clearance- Left Comments: gait deviations more pronounced with decreasing AD support  FUNCTIONAL TESTS:  5 times sit to stand: unable w/o UE assist - only able to complete 4 reps in 34.03 sec; 20.18 sec with B UE assist Timed up and go (TUG): Normal - 15.84 sec w/o AD, Manual - 15.19 sec w/o AD, Cognitive - 15.66 sec w/o AD 10 meter walk test: 18.25 sec w/o AD, 16.56 sec with SPC, 14.78 sec with platform RW Gait speed: 1.80 ft/sec w/o AD, 1.98 ft/sec with SPC, 2.22 ft/sec with platform RW Berg balance scale: TBA DGI: TBA  PATIENT SURVEYS:  ABC scale: 620 / 1600 = 38.8 %, indicating a low level of physical functioning (<69% indicates risk for recurrent falls in PD)   TODAY'S TREATMENT:    TODAY'S TREATMENT:   07/19/2024  THERAPEUTIC EXERCISE: To improve strength, endurance, ROM, and flexibility.  Demonstration, verbal and tactile cues throughout for  technique.  NuStep - L3 x 8 min (LE only) - resistance reduced d/t c/o fatigue Seated marching 2lb 2x10 BLE Seated LAQ 2lb 2x10 BLE Seated hamstring stretch x 30 B Seated piriformis stretch fig 4 x 30 B Gait 90 ft around clinic with walker   NEUROMUSCULAR RE-EDUCATION: To improve coordination, kinesthesia, posture, proprioception Seated diagonals with RTB x 10 B Seated rows GTB 2x10 Seated shoulder ext GTB 2x10 Sit to stands x 5 Standing ant/post WS in staggered stance x 10 B with walker  07/12/2024  THERAPEUTIC EXERCISE: To improve strength, endurance, ROM, and flexibility.  Demonstration, verbal and tactile cues throughout for technique.  NuStep - L3 x 6 min (LE only) - resistance reduced d/t c/o fatigue Seated hip hinge HS stretch 3 x 30 bil Seated hip hinge figure-4 piriformis stretch 3 x 30 bil Seated hip flexor stretch in lunge position over edge of seat 3 x 30 bil  NEUROMUSCULAR RE-EDUCATION: To improve coordination, kinesthesia, posture, proprioception, reduce fall risk, amplitude of movement, speed of movement to reduce bradykinesia, and reduce rigidity. Seated PWR! Moves: Up x 10 Rock x 5 bil Twist - deferred d/t increased shoulder pain Step x 5 bil Seated scapular retraction into pool noodle along spine and back of chair 10 x 3 Seated GTB scapular retraction + B shoulder row 10 x 3 Seated GTB scapular retraction + B shoulder extension 10 x 3   07/09/2024  THERAPEUTIC EXERCISE:  To improve strength, endurance, and flexibility.  Demonstration, verbal and tactile cues throughout for technique.  NuStep - L4 x 6 min (x 2' with UE/LE, remaining 4' just LE) Seated hip hinge HS stretch 2 x 30 bil Seated hip hinge figure-4 piriformis stretch 2 x 30 bil Seated hip flexor stretch in lunge position over edge of seat 2 x 30 bil  SELF CARE: Provided education to improve safety with use of platform rollator for assistive device.  Assess platform rollator height,  reinforcing upright posture and as close proximity as seat will allow. Adjusted tension on rollator brakes to increase brake engagement against wheel to secure walker during transfers and allow for better control on inclines.  Provided instruction in safe transfer techniques with rollator, ensuring brakes are locked and pushing from/reaching for chair/seat during sit <> stand transition rather than pulling on walker. Reviewed the Check for Safety - Home Fall Prevention Checklist for Older Adults to help identify fall risk hazards in the home along with strategies to reduce fall risk at home.   07/05/2024 THERAPEUTIC EXERCISE: To improve strength, endurance, and ROM.  Demonstration, verbal and tactile cues throughout for technique.   Gait 300' w/ platform walker  Hooklying Bridges 2x10  S/L Clamshells 2x10 B- L side feels tighter/more difficult  Seated hip add w/ ball squeeze 2x10, 3  Seated hip abd RTB 2x10  Seated Marches x10 B  Alt Seated Marches x20 B  STS from chair x10- fatigue noted by rep 6   NEURO RE-EDUCATION: Focusing on balance, upright posture  Side Stepping 3x10' Fwd Weight Shifts- cued to place weight into leading foot and come up onto heel of opposite foot  Fwd Stepping over foam roll w/ counter support x10  Lateral Stepping over foam roll w/ counter support x10 B  Standing hip abd w/ counter support x10 B- cued to maintain movement w/o letting feet slap the ground  Standing hip ext w/ counter support  x10 B Toe Taps onto shoe box 2x10 B- cued to gently tap the box to ensure controlled motion    06/26/24 Seated hip ADD ball squeeze 10x3; 2 sets Seated hip ABD RTB 2x10 Seated marching RTB 2x10  NEUROMUSCULAR RE-EDUCATION: To improve coordination, kinesthesia, posture, and proprioception.   Standardized Balance Assessment   Standardized Balance Assessment Berg Balance Test;Dynamic Gait Index      Berg Balance Test   Sit to Stand Able to stand  independently using  hands    Standing Unsupported Able to stand safely 2 minutes    Sitting with Back Unsupported but Feet Supported on Floor or Stool Able to sit safely and securely 2 minutes    Stand to Sit Sits safely with minimal use of hands    Transfers Able to transfer safely, minor use of hands    Standing Unsupported with Eyes Closed Able to stand 10 seconds safely    Standing Unsupported with Feet Together Able to place feet together independently and stand 1 minute safely    From Standing, Reach Forward with Outstretched Arm Can reach confidently >25 cm (10)    From Standing Position, Pick up Object from Floor Able to pick up shoe safely and easily    From Standing Position, Turn to Look Behind Over each Shoulder Looks behind from both sides and weight shifts well    Turn 360 Degrees Able to turn 360 degrees safely but slowly    Standing Unsupported, Alternately Place Feet on Step/Stool Needs assistance to keep from falling or  unable to try    Standing Unsupported, One Foot in [employer] balance while stepping or standing    Standing on One Leg Unable to try or needs assist to prevent fall    Total Score 41      Dynamic Gait Index   Level Surface Moderate Impairment    Change in Gait Speed Mild Impairment    Gait with Horizontal Head Turns Moderate Impairment    Gait with Vertical Head Turns Mild Impairment    Gait and Pivot Turn Mild Impairment    Step Over Obstacle Moderate Impairment    Step Around Obstacles Mild Impairment    Steps Moderate Impairment    Total Score 12       06/18/2024  SELF CARE:  Reviewed eval findings and role of PT in addressing identified deficits as well as need for further assessment of balance and related fall risk.    PATIENT EDUCATION:  Education details: HEP update - proximal LE stretches, transfer safety, gait safety with platform rollator, and Check for Safety - Home Fall Prevention Checklist for Older Adults   Person educated: Patient Education method:  Explanation, Demonstration, Verbal cues, Handouts, and MedBridgeGO app access provided Education comprehension: verbalized understanding, returned demonstration, verbal cues required, and needs further education  HOME EXERCISE PROGRAM: Access Code: [email or web address] URL: [email or web address] Date: 07/12/2024 Prepared by: Elijah Hidden  Exercises - Seated Hip Abduction with Resistance  - 1 x daily - 3 x weekly - 3 sets - 10 reps - Seated Hip Adduction Isometrics with Ball  - 1 x daily - 3 x weekly - 3 sets - 10 reps - Seated March with Resistance  - 1 x daily - 3 x weekly - 3 sets - 10 reps - Sit to Stand  - 1 x daily - 3 x weekly - 1 sets - 5 reps - Seated Hamstring Stretch  - 1-2 x daily - 7 x weekly - 3 reps - 30 sec hold - Seated Figure 4 Piriformis Stretch  - 1-2 x daily - 7 x weekly - 3 reps - 30 sec hold - Seated Hip Flexor Stretch  - 1-2 x daily - 7 x weekly - 3 reps - 30 sec hold  Patient Education - Check for Safety   ASSESSMENT:  CLINICAL IMPRESSION: [place] reports increased fatigue and muscle soreness from HEP over-performance, therefore reviewed recommended frequency of HEP aiming for ~daily stretching but every other day or 3x/wk with strengthening exercises for now to reduce fatigue and overuse irritation.  Reviewed LE stretches as patient reports he has not tried these at home yet, clarifying intensity of hold and hold duration.  He denies need for review of initial strengthening exercises other than clarification of frequency as above.  Introduced seated PWR! Moves, but had to defer PWR! Twist due to shoulder discomfort and limit repetitions on PWR! Rock and step to 5 instead of 10.  Patient demonstrates very rounded shoulder posture with protracted scapula bilaterally, likely exacerbated by use of platform RW and 4WW, there focused remainder of session on postural correction and strengthening with activation of scapular muscles with patient noting some relief of shoulder  discomfort during these exercises.  Despite increased soreness and fatigue from exercises Chi does feel like he has been progressing with PT.  He will benefit from continued skilled PT to address ongoing flexibility, strength and balance deficits to improve mobility and activity tolerance with decreased pain interference and decreased risk for falls.    OBJECTIVE IMPAIRMENTS: Abnormal  gait, decreased activity tolerance, decreased balance, decreased coordination, decreased endurance, decreased knowledge of condition, decreased knowledge of use of DME, decreased mobility, difficulty walking, decreased strength, decreased safety awareness, dizziness, impaired perceived functional ability, impaired flexibility, improper body mechanics, postural dysfunction, and pain.   ACTIVITY LIMITATIONS: carrying, lifting, bending, standing, squatting, stairs, transfers, bed mobility, reach over head, locomotion level, and caring for others  PARTICIPATION LIMITATIONS: meal prep, cleaning, laundry, shopping, and community activity  PERSONAL FACTORS: Age, Fitness, Past/current experiences, Time since onset of injury/illness/exacerbation, and 3+ comorbidities: Anxiety, asthma, bipolar affective disorder, COPD, depression, DM-II, DJD, chronic knee pain, B TKA, GERD, HTN, Mnire's disease, obesity, pancreatitis, h/o CVA, neurologic dysphagia, tardive dyskinesia, HOH - L deaf & R hearing aid are also affecting patient's functional outcome.   REHAB POTENTIAL: Good  CLINICAL DECISION MAKING: Unstable/unpredictable  EVALUATION COMPLEXITY: High   GOALS: Goals reviewed with patient? Yes  SHORT TERM GOALS: Target date: 07/30/2024  Patient will be independent with initial HEP. Baseline: To be established 07/05/24 - consistent with current HEP  Goal status: IN PROGRESS - 07/12/24 - clarified HEP frequency  2.  Patient will demonstrate decreased fall risk by scoring < 13.5 sec on normal TUG. Baseline: 15.84 sec w/o  AD Goal status: INITIAL  3.  Patient will be educated on strategies to decrease risk of falls.  Baseline:  Goal status: MET - 07/09/24  LONG TERM GOALS: Target date: 09/10/2024  Patient will be independent with ongoing/advanced HEP for self-management at home incorporating PWR! Moves as indicated .  Baseline:  Goal status: INITIAL  2.  Patient will be able to ambulate 600' with LRAD with good safety to access community.  Baseline:  Goal status: INITIAL  3.  Patient will be able to step up/down curb safely with LRAD for safety with community ambulation.  Baseline:  Goal status: INITIAL   4.  Patient will demonstrate gait speed of >/= 2.62 ft/sec (0.8 m/s) to be a safe limited community ambulator with decreased risk for recurrent falls.  Baseline: 1.80 ft/sec w/o AD, 1.98 ft/sec with SPC and 2.22 ft/sec with platform RW Goal status: IN PROGRESS- 07/19/24 2.26 ft/sec  5.  Patient will improve 5x STS time to </= 15 seconds w/o need for UE assist to demonstrate improved functional strength and transfer efficiency. Baseline: unable w/o UE assist - only able to complete 4 reps in 34.03 sec; 20.18 sec with B UE assist Goal status: INITIAL  6.  Patient will demonstrate at least 19/24 on DGI to improve gait stability and reduce risk for falls. Baseline: TBA Goal status: INITIAL  7.  Patient will improve Berg score by at least 8 points to improve safety and stability with ADLs in standing and reduce risk for falls. (MCID = 8 points)   Baseline: TBA Goal status: INITIAL  8.  Patient will report >/= 52% on ABC scale to demonstrate improved balance confidence and decreased risk for falls. Baseline: 620 / 1600 = 38.8 % Goal status: INITIAL  9. Patient will verbalize understanding of local Parkinson's disease community resources, including community fitness post d/c. Baseline:  Goal status: INITIAL   PLAN:  PT FREQUENCY: 2x/week  PT DURATION: 12 weeks  PLANNED INTERVENTIONS: [id number]-  PT Re-evaluation, 97750- Physical Performance Testing, 97110-Therapeutic exercises, 97530- Therapeutic activity, [id number]- Neuromuscular re-education, 97535- Self Care, [id number]- Manual therapy, [id number]- Gait training, [phone]- Canalith repositioning, H9716- Electrical stimulation (unattended), [id number]- Ultrasound, [id number]- Ionotophoresis 4mg /ml Dexamethasone , 79439 (1-2 muscles), 20561 (3+ muscles)- Dry Needling, Patient/Family education, Balance training, Stair  training, Taping, Joint mobilization, Vestibular training, DME instructions, Cryotherapy, and Moist heat  PLAN FOR NEXT SESSION: Review and expand HEP for LE flexibility and core/LE strengthening PRN; continue LE strengthening; balance activities (both static and dynamic); initiate PWR! Moves    Sol LITTIE Gaskins, PTA 07/19/2024, 12:09 PM    Date of referral: 05/31/24 Referring provider: Amon Aloysius BRAVO, MD Referring diagnosis?  G21.11,T43.505A (ICD-10-CM) - Neuroleptic-induced parkinsonism (HCC)  R26.9 (ICD-10-CM) - Gait disorder  R29.6 (ICD-10-CM) - Multiple falls   Treatment diagnosis? (if different than referring diagnosis)  Muscle weakness (generalized)  Unsteadiness on feet  Other abnormalities of gait and mobility  History of falling  What was this (referring dx) caused by? Ongoing Issue and Other: neurodegenerative disease  Nature of Condition: Chronic (continuous duration > 3 months)   Laterality: Both  Current Functional Measure Score: Other ABC Scale: 620 / 1600 = 38.8 %, indicating a low level of physical functioning (<69% indicates risk for recurrent falls in PD)    Objective measurements identify impairments when they are compared to normal values, the uninvolved extremity, and prior level of function.  [x]  Yes  []  No  Objective assessment of functional ability: Moderate functional limitations   Briefly describe symptoms:  Patient feels like he is progressing with PT but may have been overdoing HEP resulting in increased  fatigue and muscle soreness, hence perceived slight worsening of condition since onset of therapy.  He continues to demonstrate abnormal posture, decreased flexibility, impaired strength, and impaired balance with high risk for falls indicating need for continued PT.  How did symptoms start: gradual onset  Average pain intensity:  Last 24 hours: 3.5-6.5/10  Past week: 3.5-6.5/10  How often does the pt experience symptoms? Frequently  How much have the symptoms interfered with usual daily activities? Quite a bit  How has condition changed since care began at this facility? A little worse - sore from exercises  In general, how is the patients overall health? Fair  Onset date: Parkinsonism diagnosis in 2013, worsening weakness over the past 6 months    BACK PAIN (STarT Back Screening Tool) - (When applicable): N/A  Has your back pain spread down your leg(s) at sometime in the last 2 weeks? []  Yes   []  No Have you had pain in the shoulder or neck at sometime in the past 2 weeks? []  Yes   []  No Have you only walked short distances because of your back pain? []  Yes   []  No In the past 2 weeks, have you dressed more slowly than usual because of your back pain? []  Yes   []  No Do you think it is not really safe for person with a condition like yours to be physically active? []  Yes   []  No Have worrying thoughts been going through your mind a lot of the time? []  Yes   []  No Do you feel that your back pain is terrible and it is never going to get any better? []  Yes   []  No In general, have you stopped enjoying all the things you usually enjoy? []  Yes   []  No Overall, how bothersome has your back pain been in the last 2 weeks? []  Not at all   []  Slightly     []  Moderate   []  Very much     []  Extremely

## 2024-07-24 ENCOUNTER — Ambulatory Visit

## 2024-07-24 DIAGNOSIS — R2681 Unsteadiness on feet: Secondary | ICD-10-CM | POA: Diagnosis not present

## 2024-07-24 DIAGNOSIS — R2689 Other abnormalities of gait and mobility: Secondary | ICD-10-CM

## 2024-07-24 DIAGNOSIS — Z9181 History of falling: Secondary | ICD-10-CM

## 2024-07-24 DIAGNOSIS — M6281 Muscle weakness (generalized): Secondary | ICD-10-CM

## 2024-07-24 NOTE — Therapy (Signed)
 OUTPATIENT PHYSICAL THERAPY [facility]   Patient Name: Ryan Zhang MRN: [id number] DOB:07-29-1946, 78 y.o., male Today's Date: 07/24/2024   END OF SESSION:  PT End of Session - 07/24/24 1442     Visit Number 7    Date for PT Re-Evaluation 09/10/24    Authorization Type [employer]    Authorization Time Period 07/19/24- 08/16/24    Authorization - Visit Number 2    Authorization - Number of Visits 6    PT Start Time 1401    PT Stop Time 1442    PT Time Calculation (min) 41 min    Activity Tolerance Patient tolerated treatment well    Behavior During Therapy WFL for tasks assessed/performed               Past Medical History:  Diagnosis Date   Allergy    allergy shots Dr. Cloretta   Anemia    Anxiety    Asthma    moderate persistant   Bipolar affective (HCC)    Cataract    both eyes   Clotting disorder (HCC)    Due to blood thinner   COPD (chronic obstructive pulmonary disease) (HCC)    Depression    Diabetes mellitus    DJD (degenerative joint disease)    Dysphagia    Gallstones    hx of, s/p cholecystectomy   GERD (gastroesophageal reflux disease)    Hepatitis A    as teenager 60's   Hollenhorst plaque    right eye   Hyperlipidemia    Hypertension    Kidney stones    hx of   Meniere's disease    Motility disorder, esophageal    Obesity    Pancreatitis    Personal history of colonic polyps 11/2004   hyperplastic.   Stroke [facility])    per MRI   Past Surgical History:  Procedure Laterality Date   CATARACT EXTRACTION Bilateral 09/2015 and 10/2015   CHOLECYSTECTOMY     COLONOSCOPY     EYE SURGERY     JOINT REPLACEMENT     TOTAL KNEE ARTHROPLASTY Bilateral    both knees   Patient Active Problem List   Diagnosis Date Noted   Tinea cruris 07/24/2020   PCP NOTES >>>>>>>>>>>>>>>>>>>>>>>>>>>>>>> 01/12/2016   Folliculitis 05/09/2015   Hearing loss in right ear 08/05/2014   Panic attack as reaction to stress 04/20/2012   Dysphagia,  neurologic 01/07/2012   Morbid obesity (HCC) 01/07/2012   Chest pain, musculoskeletal 12/07/2011   Candidiasis of skin 08/12/2011   Annual physical exam 06/07/2011   OLECRANON BURSITIS 01/29/2011   PARTIAL ARTERIAL OCCLUSION OF RETINA 05/27/2010   Essential hypertension, benign 01/16/2010   ABNORMAL ELECTROCARDIOGRAM 01/14/2010   Asthma 10/14/2009   GERD 10/14/2009   Bipolar disorder (HCC) 02/20/2009   PHIMOSIS 02/20/2009   KNEE PAIN, CHRONIC 11/20/2008   HIP PAIN, RIGHT 08/19/2008   DM type 2 with diabetic peripheral neuropathy (HCC) 02/22/2007   Hyperlipidemia associated with type 2 diabetes mellitus (HCC) 02/22/2007   ALLERGIC RHINITIS, SEASONAL 02/22/2007   LOW BACK PAIN, CHRONIC 02/22/2007   MENIERE'S DISEASE, HX OF 02/22/2007    PCP: Amon Aloysius BRAVO, MD   REFERRING PROVIDER: Amon Aloysius BRAVO, MD  (Neurologist - Evonnie Stabs, MD)  REFERRING DIAG:  [phone] (ICD-10-CM) - Neuroleptic-induced parkinsonism (HCC)  R26.9 (ICD-10-CM) - Gait disorder  R29.6 (ICD-10-CM) - Multiple falls   THERAPY DIAG:  Muscle weakness (generalized)  Unsteadiness on feet  Other abnormalities of gait and mobility  History  of falling  RATIONALE FOR EVALUATION AND TREATMENT: Rehabilitation  ONSET DATE: ~2013 - 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 Yes: NPRS scale: 4/10 (when using arms)  Pain location: L shoulder, elbow, wrist, hand and thumb Pain description: sharp in shoulders, sometimes aching  Aggravating factors: being up and moving around  Relieving factors: extra-strength Tylenol  when pain is  really bad (mostly ignores the pain), when really bad, will take hydrocodone  to help him sleep   PERTINENT HISTORY:  Anxiety, asthma, bipolar affective disorder, COPD, depression, DM-II, DJD, chronic knee pain, B TKA, GERD, HTN, Mnire's disease, obesity, pancreatitis, h/o CVA, neurologic dysphagia, tardive dyskinesia, HOH - L deaf & R hearing aid  PRECAUTIONS: Fall  RED FLAGS: None  WEIGHT BEARING RESTRICTIONS: No  FALLS:  Has patient fallen in last 6 months? No and before the levodopa  supplements, falls were daily (sometimes several times a day)  LIVING ENVIRONMENT: Lives with: lives with their spouse and dog Lives in: Apartment Stairs: No Has following equipment at home: Single point cane, Environmental consultant - 2 wheeled, shower chair, and Grab bars (platform RW)  OCCUPATION: Retired  PLOF: Independent, Independent with community mobility with device, and Leisure: watching TV, listening to books, programming, walking ~15 min (around the block) - tries to do daily   PATIENT GOALS: To get back to the point where I can walk to the car unassisted (no AD) and use platform RW less in community.  Be able to assist my wife with bringing groceries in from the car.   OBJECTIVE: (objective measures completed at initial evaluation unless otherwise dated)  DIAGNOSTIC FINDINGS:  N/A - Multiple imaging studies in 2024 s/p falls but no acute fractures.  COGNITION: Overall cognitive status: Impaired and History of cognitive impairments - at baseline - mostly memory issues resulting from ECT 19 yrs ago, better over last 3 months but still notes limited STM   SENSATION: WFL  COORDINATION: B Heel/toe WFL Heel to shin unable bilaterally   POSTURE:  rounded shoulders, forward head, and flexed trunk   MUSCLE LENGTH: Hamstrings: mod/severe tight B Piriformis: mild /mod tight R>L Hip flexors: mod tight   LOWER EXTREMITY ROM:  Grossly WFL except limited hip extension and rotation  bilaterally  LOWER EXTREMITY MMT:    MMT Right eval Left eval  Hip flexion 3+ 3+  Hip extension 3+ 3+  Hip abduction 3- 3  Hip adduction 3+ 3+  Hip internal rotation 4- 3+  Hip external rotation 4- 3+  Knee flexion 4 4  Knee extension 4 4  Ankle dorsiflexion 4- 4-  Ankle plantarflexion    Ankle inversion    Ankle eversion    (Blank rows = not tested)  BED MOBILITY:  SBA  TRANSFERS: Assistive device utilized: None  Sit to stand: Modified independence and SBA Stand to sit: Modified independence and SBA Chair to chair: SBA Floor: NT  GAIT: Distance walked: clinic distances Assistive device utilized: Single point cane, platform RW/2WW, and None Level of assistance: SBA Gait pattern: step through pattern, decreased arm swing- Right, decreased arm swing- Left, decreased stride length, decreased hip/knee flexion- Right, decreased hip/knee flexion- Left, decreased ankle dorsiflexion- Right, decreased ankle dorsiflexion- Left, shuffling, festinating, trunk flexed, poor foot clearance- Right, and poor foot clearance- Left Comments: gait deviations more pronounced with decreasing AD support  FUNCTIONAL TESTS:  5 times sit to stand: unable w/o UE assist - only able to complete 4 reps in 34.03 sec; 20.18 sec with B UE assist Timed up and go (TUG): Normal - 15.84 sec w/o AD, Manual - 15.19 sec w/o AD, Cognitive - 15.66 sec w/o AD 10 meter walk test: 18.25 sec w/o AD, 16.56 sec with SPC, 14.78 sec with platform RW Gait speed: 1.80 ft/sec w/o AD, 1.98 ft/sec with SPC, 2.22 ft/sec with platform RW Berg balance scale: TBA DGI: TBA  PATIENT SURVEYS:  ABC scale: 620 / 1600 = 38.8 %, indicating a low level of physical functioning (<69% indicates risk for recurrent falls in PD)   TODAY'S TREATMENT:    TODAY'S TREATMENT:  07/24/2024  THERAPEUTIC EXERCISE: To improve strength, endurance, ROM, and flexibility.  Demonstration, verbal and tactile cues throughout for technique.  NuStep - L4 x  8 min (LE only) - resistance reduced d/t c/o fatigue Gait 270 ft around clinic with walker   NEUROMUSCULAR RE-EDUCATION: To improve coordination, kinesthesia, posture, proprioception Seated:  PWR! Up 2x10  PWR Rock 2x10 each direction  PWR Twist 2x10 each direction  PWR step x 5 each direction very difficult  Sit to stand x 5  07/19/2024  THERAPEUTIC EXERCISE: To improve strength, endurance, ROM, and flexibility.  Demonstration, verbal and tactile cues throughout for technique.  NuStep - L3 x 8 min (LE only) - resistance reduced d/t c/o fatigue Seated marching 2lb 2x10 BLE Seated LAQ 2lb 2x10 BLE Seated hamstring stretch x 30 B Seated piriformis stretch fig 4 x 30 B Gait 90 ft around clinic with walker   NEUROMUSCULAR RE-EDUCATION: To improve coordination, kinesthesia, posture, proprioception Seated diagonals with RTB x 10 B Seated rows GTB 2x10 Seated shoulder ext GTB 2x10 Sit to stands x 5 Standing ant/post WS in staggered stance x 10 B with walker  07/12/2024  THERAPEUTIC EXERCISE: To improve strength, endurance, ROM, and flexibility.  Demonstration, verbal and tactile cues throughout for technique.  NuStep - L3 x 6 min (LE only) - resistance reduced d/t c/o fatigue Seated hip hinge HS stretch 3 x 30 bil Seated hip hinge figure-4 piriformis stretch 3 x 30 bil Seated hip flexor stretch in lunge position over edge of seat 3 x 30 bil  NEUROMUSCULAR RE-EDUCATION: To improve coordination, kinesthesia, posture, proprioception, reduce fall risk, amplitude  of movement, speed of movement to reduce bradykinesia, and reduce rigidity. Seated PWR! Moves: Up x 10 Rock x 5 bil Twist - deferred d/t increased shoulder pain Step x 5 bil Seated scapular retraction into pool noodle along spine and back of chair 10 x 3 Seated GTB scapular retraction + B shoulder row 10 x 3 Seated GTB scapular retraction + B shoulder extension 10 x 3   07/09/2024  THERAPEUTIC EXERCISE: To improve  strength, endurance, and flexibility.  Demonstration, verbal and tactile cues throughout for technique.  NuStep - L4 x 6 min (x 2' with UE/LE, remaining 4' just LE) Seated hip hinge HS stretch 2 x 30 bil Seated hip hinge figure-4 piriformis stretch 2 x 30 bil Seated hip flexor stretch in lunge position over edge of seat 2 x 30 bil  SELF CARE: Provided education to improve safety with use of platform rollator for assistive device.  Assess platform rollator height, reinforcing upright posture and as close proximity as seat will allow. Adjusted tension on rollator brakes to increase brake engagement against wheel to secure walker during transfers and allow for better control on inclines.  Provided instruction in safe transfer techniques with rollator, ensuring brakes are locked and pushing from/reaching for chair/seat during sit <> stand transition rather than pulling on walker. Reviewed the Check for Safety - Home Fall Prevention Checklist for Older Adults to help identify fall risk hazards in the home along with strategies to reduce fall risk at home.   07/05/2024 THERAPEUTIC EXERCISE: To improve strength, endurance, and ROM.  Demonstration, verbal and tactile cues throughout for technique.   Gait 300' w/ platform walker  Hooklying Bridges 2x10  S/L Clamshells 2x10 B- L side feels tighter/more difficult  Seated hip add w/ ball squeeze 2x10, 3  Seated hip abd RTB 2x10  Seated Marches x10 B  Alt Seated Marches x20 B  STS from chair x10- fatigue noted by rep 6   NEURO RE-EDUCATION: Focusing on balance, upright posture  Side Stepping 3x10' Fwd Weight Shifts- cued to place weight into leading foot and come up onto heel of opposite foot  Fwd Stepping over foam roll w/ counter support x10  Lateral Stepping over foam roll w/ counter support x10 B  Standing hip abd w/ counter support x10 B- cued to maintain movement w/o letting feet slap the ground  Standing hip ext w/ counter support  x10  B Toe Taps onto shoe box 2x10 B- cued to gently tap the box to ensure controlled motion    06/26/24 Seated hip ADD ball squeeze 10x3; 2 sets Seated hip ABD RTB 2x10 Seated marching RTB 2x10  NEUROMUSCULAR RE-EDUCATION: To improve coordination, kinesthesia, posture, and proprioception.   Standardized Balance Assessment   Standardized Balance Assessment Berg Balance Test;Dynamic Gait Index      Berg Balance Test   Sit to Stand Able to stand  independently using hands    Standing Unsupported Able to stand safely 2 minutes    Sitting with Back Unsupported but Feet Supported on Floor or Stool Able to sit safely and securely 2 minutes    Stand to Sit Sits safely with minimal use of hands    Transfers Able to transfer safely, minor use of hands    Standing Unsupported with Eyes Closed Able to stand 10 seconds safely    Standing Unsupported with Feet Together Able to place feet together independently and stand 1 minute safely    From Standing, Reach Forward with Outstretched Arm Can reach confidently >  25 cm (10)    From Standing Position, Pick up Object from Floor Able to pick up shoe safely and easily    From Standing Position, Turn to Look Behind Over each Shoulder Looks behind from both sides and weight shifts well    Turn 360 Degrees Able to turn 360 degrees safely but slowly    Standing Unsupported, Alternately Place Feet on Step/Stool Needs assistance to keep from falling or unable to try    Standing Unsupported, One Foot in [employer] balance while stepping or standing    Standing on One Leg Unable to try or needs assist to prevent fall    Total Score 41      Dynamic Gait Index   Level Surface Moderate Impairment    Change in Gait Speed Mild Impairment    Gait with Horizontal Head Turns Moderate Impairment    Gait with Vertical Head Turns Mild Impairment    Gait and Pivot Turn Mild Impairment    Step Over Obstacle Moderate Impairment    Step Around Obstacles Mild Impairment     Steps Moderate Impairment    Total Score 12       06/18/2024  SELF CARE:  Reviewed eval findings and role of PT in addressing identified deficits as well as need for further assessment of balance and related fall risk.    PATIENT EDUCATION:  Education details: HEP update - proximal LE stretches, transfer safety, gait safety with platform rollator, and Check for Safety - Home Fall Prevention Checklist for Older Adults   Person educated: Patient Education method: Explanation, Demonstration, Verbal cues, Handouts, and MedBridgeGO app access provided Education comprehension: verbalized understanding, returned demonstration, verbal cues required, and needs further education  HOME EXERCISE PROGRAM: Access Code: [email or web address] URL: [email or web address] Date: 07/12/2024 Prepared by: Elijah Hidden  Exercises - Seated Hip Abduction with Resistance  - 1 x daily - 3 x weekly - 3 sets - 10 reps - Seated Hip Adduction Isometrics with Ball  - 1 x daily - 3 x weekly - 3 sets - 10 reps - Seated March with Resistance  - 1 x daily - 3 x weekly - 3 sets - 10 reps - Sit to Stand  - 1 x daily - 3 x weekly - 1 sets - 5 reps - Seated Hamstring Stretch  - 1-2 x daily - 7 x weekly - 3 reps - 30 sec hold - Seated Figure 4 Piriformis Stretch  - 1-2 x daily - 7 x weekly - 3 reps - 30 sec hold - Seated Hip Flexor Stretch  - 1-2 x daily - 7 x weekly - 3 reps - 30 sec hold  Patient Education - Check for Safety   ASSESSMENT:  CLINICAL IMPRESSION: Able to work on [employer]! Moves, strength, and endurance interventions. Pt still fatigued with sit to stands, although able to increase walking distance today. Pt responded well, cues and instruction given throughout session.   OBJECTIVE IMPAIRMENTS: Abnormal gait, decreased activity tolerance, decreased balance, decreased coordination, decreased endurance, decreased knowledge of condition, decreased knowledge of use of DME, decreased mobility, difficulty walking,  decreased strength, decreased safety awareness, dizziness, impaired perceived functional ability, impaired flexibility, improper body mechanics, postural dysfunction, and pain.   ACTIVITY LIMITATIONS: carrying, lifting, bending, standing, squatting, stairs, transfers, bed mobility, reach over head, locomotion level, and caring for others  PARTICIPATION LIMITATIONS: meal prep, cleaning, laundry, shopping, and community activity  PERSONAL FACTORS: Age, Fitness, Past/current experiences, Time since onset of injury/illness/exacerbation, and 3+ comorbidities: Anxiety,  asthma, bipolar affective disorder, COPD, depression, DM-II, DJD, chronic knee pain, B TKA, GERD, HTN, Mnire's disease, obesity, pancreatitis, h/o CVA, neurologic dysphagia, tardive dyskinesia, HOH - L deaf & R hearing aid are also affecting patient's functional outcome.   REHAB POTENTIAL: Good  CLINICAL DECISION MAKING: Unstable/unpredictable  EVALUATION COMPLEXITY: High   GOALS: Goals reviewed with patient? Yes  SHORT TERM GOALS: Target date: 07/30/2024  Patient will be independent with initial HEP. Baseline: To be established 07/05/24 - consistent with current HEP  Goal status: IN PROGRESS - 07/12/24 - clarified HEP frequency  2.  Patient will demonstrate decreased fall risk by scoring < 13.5 sec on normal TUG. Baseline: 15.84 sec w/o AD Goal status: INITIAL  3.  Patient will be educated on strategies to decrease risk of falls.  Baseline:  Goal status: MET - 07/09/24  LONG TERM GOALS: Target date: 09/10/2024  Patient will be independent with ongoing/advanced HEP for self-management at home incorporating PWR! Moves as indicated .  Baseline:  Goal status: INITIAL  2.  Patient will be able to ambulate 600' with LRAD with good safety to access community.  Baseline:  Goal status: INITIAL  3.  Patient will be able to step up/down curb safely with LRAD for safety with community ambulation.  Baseline:  Goal status:  INITIAL   4.  Patient will demonstrate gait speed of >/= 2.62 ft/sec (0.8 m/s) to be a safe limited community ambulator with decreased risk for recurrent falls.  Baseline: 1.80 ft/sec w/o AD, 1.98 ft/sec with SPC and 2.22 ft/sec with platform RW Goal status: IN PROGRESS- 07/19/24 2.26 ft/sec  5.  Patient will improve 5x STS time to </= 15 seconds w/o need for UE assist to demonstrate improved functional strength and transfer efficiency. Baseline: unable w/o UE assist - only able to complete 4 reps in 34.03 sec; 20.18 sec with B UE assist Goal status: INITIAL  6.  Patient will demonstrate at least 19/24 on DGI to improve gait stability and reduce risk for falls. Baseline: TBA Goal status: INITIAL  7.  Patient will improve Berg score by at least 8 points to improve safety and stability with ADLs in standing and reduce risk for falls. (MCID = 8 points)   Baseline: TBA Goal status: INITIAL  8.  Patient will report >/= 52% on ABC scale to demonstrate improved balance confidence and decreased risk for falls. Baseline: 620 / 1600 = 38.8 % Goal status: INITIAL  9. Patient will verbalize understanding of local Parkinson's disease community resources, including community fitness post d/c. Baseline:  Goal status: INITIAL   PLAN:  PT FREQUENCY: 2x/week  PT DURATION: 12 weeks  PLANNED INTERVENTIONS: 97164- PT Re-evaluation, 97750- Physical Performance Testing, 97110-Therapeutic exercises, 97530- Therapeutic activity, [id number]- Neuromuscular re-education, 97535- Self Care, [id number]- Manual therapy, [id number]- Gait training, [phone]- Canalith repositioning, [id number]- Electrical stimulation (unattended), 97035- Ultrasound, 02966- Ionotophoresis 4mg /ml Dexamethasone , 79439 (1-2 muscles), 20561 (3+ muscles)- Dry Needling, Patient/Family education, Balance training, Stair training, Taping, Joint mobilization, Vestibular training, DME instructions, Cryotherapy, and Moist heat  PLAN FOR NEXT SESSION: continue LE  strengthening; balance activities (both static and dynamic); PWR! Moves    Alisha Bacus L Rainee Sweatt, PTA 07/24/2024, 2:55 PM    Date of referral: 05/31/24 Referring provider: Amon Aloysius BRAVO, MD Referring diagnosis?  G21.11,T43.505A (ICD-10-CM) - Neuroleptic-induced parkinsonism (HCC)  R26.9 (ICD-10-CM) - Gait disorder  R29.6 (ICD-10-CM) - Multiple falls   Treatment diagnosis? (if different than referring diagnosis)  Muscle weakness (generalized)  Unsteadiness on feet  Other abnormalities  of gait and mobility  History of falling  What was this (referring dx) caused by? Ongoing Issue and Other: neurodegenerative disease  Nature of Condition: Chronic (continuous duration > 3 months)   Laterality: Both  Current Functional Measure Score: Other ABC Scale: 620 / 1600 = 38.8 %, indicating a low level of physical functioning (<69% indicates risk for recurrent falls in PD)    Objective measurements identify impairments when they are compared to normal values, the uninvolved extremity, and prior level of function.  [x]  Yes  []  No  Objective assessment of functional ability: Moderate functional limitations   Briefly describe symptoms:  Patient feels like he is progressing with PT but may have been overdoing HEP resulting in increased fatigue and muscle soreness, hence perceived slight worsening of condition since onset of therapy.  He continues to demonstrate abnormal posture, decreased flexibility, impaired strength, and impaired balance with high risk for falls indicating need for continued PT.  How did symptoms start: gradual onset  Average pain intensity:  Last 24 hours: 3.5-6.5/10  Past week: 3.5-6.5/10  How often does the pt experience symptoms? Frequently  How much have the symptoms interfered with usual daily activities? Quite a bit  How has condition changed since care began at this facility? A little worse - sore from exercises  In general, how is the patients overall health?  Fair  Onset date: Parkinsonism diagnosis in 2013, worsening weakness over the past 6 months    BACK PAIN (STarT Back Screening Tool) - (When applicable): N/A  Has your back pain spread down your leg(s) at sometime in the last 2 weeks? []  Yes   []  No Have you had pain in the shoulder or neck at sometime in the past 2 weeks? []  Yes   []  No Have you only walked short distances because of your back pain? []  Yes   []  No In the past 2 weeks, have you dressed more slowly than usual because of your back pain? []  Yes   []  No Do you think it is not really safe for person with a condition like yours to be physically active? []  Yes   []  No Have worrying thoughts been going through your mind a lot of the time? []  Yes   []  No Do you feel that your back pain is terrible and it is never going to get any better? []  Yes   []  No In general, have you stopped enjoying all the things you usually enjoy? []  Yes   []  No Overall, how bothersome has your back pain been in the last 2 weeks? []  Not at all   []  Slightly     []  Moderate   []  Very much     []  Extremely

## 2024-07-25 DIAGNOSIS — J3089 Other allergic rhinitis: Secondary | ICD-10-CM | POA: Diagnosis not present

## 2024-07-25 DIAGNOSIS — J3081 Allergic rhinitis due to animal (cat) (dog) hair and dander: Secondary | ICD-10-CM | POA: Diagnosis not present

## 2024-07-25 DIAGNOSIS — J301 Allergic rhinitis due to pollen: Secondary | ICD-10-CM | POA: Diagnosis not present

## 2024-08-01 DIAGNOSIS — J301 Allergic rhinitis due to pollen: Secondary | ICD-10-CM | POA: Diagnosis not present

## 2024-08-01 DIAGNOSIS — J3081 Allergic rhinitis due to animal (cat) (dog) hair and dander: Secondary | ICD-10-CM | POA: Diagnosis not present

## 2024-08-01 DIAGNOSIS — J3089 Other allergic rhinitis: Secondary | ICD-10-CM | POA: Diagnosis not present

## 2024-08-05 ENCOUNTER — Other Ambulatory Visit: Payer: Self-pay | Admitting: Internal Medicine

## 2024-08-06 ENCOUNTER — Encounter: Payer: Self-pay | Admitting: Physical Therapy

## 2024-08-06 ENCOUNTER — Ambulatory Visit: Payer: Self-pay | Admitting: Physical Therapy

## 2024-08-06 DIAGNOSIS — R2681 Unsteadiness on feet: Secondary | ICD-10-CM | POA: Diagnosis not present

## 2024-08-06 DIAGNOSIS — Z9181 History of falling: Secondary | ICD-10-CM | POA: Diagnosis not present

## 2024-08-06 DIAGNOSIS — R2689 Other abnormalities of gait and mobility: Secondary | ICD-10-CM

## 2024-08-06 DIAGNOSIS — M6281 Muscle weakness (generalized): Secondary | ICD-10-CM | POA: Diagnosis not present

## 2024-08-06 NOTE — Therapy (Signed)
 OUTPATIENT PHYSICAL THERAPY [facility]   Patient Name: Ryan Zhang MRN: [id number] DOB:Sep 06, 1946, 78 y.o., male Today's Date: 08/06/2024   END OF SESSION:  PT End of Session - 08/06/24 1056     Visit Number 8    Date for Recertification  09/10/24    Authorization Type [employer]    Authorization Time Period 07/19/24 - 08/16/24    Authorization - Visit Number 3    Authorization - Number of Visits 6    PT Start Time 1056    PT Stop Time 1141    PT Time Calculation (min) 45 min    Activity Tolerance Patient tolerated treatment well    Behavior During Therapy WFL for tasks assessed/performed                Past Medical History:  Diagnosis Date   Allergy    allergy shots Dr. Cloretta   Anemia    Anxiety    Asthma    moderate persistant   Bipolar affective (HCC)    Cataract    both eyes   Clotting disorder (HCC)    Due to blood thinner   COPD (chronic obstructive pulmonary disease) (HCC)    Depression    Diabetes mellitus    DJD (degenerative joint disease)    Dysphagia    Gallstones    hx of, s/p cholecystectomy   GERD (gastroesophageal reflux disease)    Hepatitis A    as teenager 60's   Hollenhorst plaque    right eye   Hyperlipidemia    Hypertension    Kidney stones    hx of   Meniere's disease    Motility disorder, esophageal    Obesity    Pancreatitis    Personal history of colonic polyps 11/2004   hyperplastic.   Stroke [facility])    per MRI   Past Surgical History:  Procedure Laterality Date   CATARACT EXTRACTION Bilateral 09/2015 and 10/2015   CHOLECYSTECTOMY     COLONOSCOPY     EYE SURGERY     JOINT REPLACEMENT     TOTAL KNEE ARTHROPLASTY Bilateral    both knees   Patient Active Problem List   Diagnosis Date Noted   Tinea cruris 07/24/2020   PCP NOTES >>>>>>>>>>>>>>>>>>>>>>>>>>>>>>> 01/12/2016   Folliculitis 05/09/2015   Hearing loss in right ear 08/05/2014   Panic attack as reaction to stress 04/20/2012   Dysphagia,  neurologic 01/07/2012   Morbid obesity (HCC) 01/07/2012   Chest pain, musculoskeletal 12/07/2011   Candidiasis of skin 08/12/2011   Annual physical exam 06/07/2011   OLECRANON BURSITIS 01/29/2011   PARTIAL ARTERIAL OCCLUSION OF RETINA 05/27/2010   Essential hypertension, benign 01/16/2010   ABNORMAL ELECTROCARDIOGRAM 01/14/2010   Asthma 10/14/2009   GERD 10/14/2009   Bipolar disorder (HCC) 02/20/2009   PHIMOSIS 02/20/2009   KNEE PAIN, CHRONIC 11/20/2008   HIP PAIN, RIGHT 08/19/2008   DM type 2 with diabetic peripheral neuropathy (HCC) 02/22/2007   Hyperlipidemia associated with type 2 diabetes mellitus (HCC) 02/22/2007   ALLERGIC RHINITIS, SEASONAL 02/22/2007   LOW BACK PAIN, CHRONIC 02/22/2007   MENIERE'S DISEASE, HX OF 02/22/2007    PCP: Amon Aloysius BRAVO, MD   REFERRING PROVIDER: Amon Aloysius BRAVO, MD  (Neurologist - Evonnie Stabs, MD)  REFERRING DIAG:  [phone] (ICD-10-CM) - Neuroleptic-induced parkinsonism (HCC)  R26.9 (ICD-10-CM) - Gait disorder  R29.6 (ICD-10-CM) - Multiple falls   THERAPY DIAG:  Muscle weakness (generalized)  Unsteadiness on feet  Other abnormalities of gait and mobility  History of falling  RATIONALE FOR EVALUATION AND TREATMENT: Rehabilitation  ONSET DATE: ~2013 - 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  He states he has started taking Tylenol  around the clock which seems to be helping with his pain.  Pt accompanied by: self  PAIN: Are you having pain? Yes: NPRS scale: 1-2/10   Pain location: R shoulder, L elbow, wrist, hand and thumb Pain description:  sharp in shoulders, sometimes aching  Aggravating factors: being up and moving around  Relieving factors: extra-strength Tylenol  when pain is really bad (mostly ignores the pain), when really bad, will take hydrocodone  to help him sleep   PERTINENT HISTORY:  Anxiety, asthma, bipolar affective disorder, COPD, depression, DM-II, DJD, chronic knee pain, B TKA, GERD, HTN, Mnire's disease, obesity, pancreatitis, h/o CVA, neurologic dysphagia, tardive dyskinesia, HOH - L deaf & R hearing aid  PRECAUTIONS: Fall  RED FLAGS: None  WEIGHT BEARING RESTRICTIONS: No  FALLS:  Has patient fallen in last 6 months? No and before the levodopa  supplements, falls were daily (sometimes several times a day)  LIVING ENVIRONMENT: Lives with: lives with their spouse and dog Lives in: Apartment Stairs: No Has following equipment at home: Single point cane, Environmental consultant - 2 wheeled, shower chair, and Grab bars (platform RW)  OCCUPATION: Retired  PLOF: Independent, Independent with community mobility with device, and Leisure: watching TV, listening to books, programming, walking ~15 min (around the block) - tries to do daily   PATIENT GOALS: To get back to the point where I can walk to the car unassisted (no AD) and use platform RW less in community.  Be able to assist my wife with bringing groceries in from the car.   OBJECTIVE: (objective measures completed at initial evaluation unless otherwise dated)  DIAGNOSTIC FINDINGS:  N/A - Multiple imaging studies in 2024 s/p falls but no acute fractures.  COGNITION: Overall cognitive status: Impaired and History of cognitive impairments - at baseline - mostly memory issues resulting from ECT 19 yrs ago, better over last 3 months but still notes limited STM   SENSATION: WFL  COORDINATION: B Heel/toe WFL Heel to shin unable bilaterally   POSTURE:  rounded shoulders, forward head, and flexed trunk   MUSCLE LENGTH: Hamstrings: mod/severe tight  B Piriformis: mild /mod tight R>L Hip flexors: mod tight   LOWER EXTREMITY ROM:    Grossly WFL except limited hip extension and rotation bilaterally  LOWER EXTREMITY MMT:    MMT Right eval Left eval  Hip flexion 3+ 3+  Hip extension 3+ 3+  Hip abduction 3- 3  Hip adduction 3+ 3+  Hip internal rotation 4- 3+  Hip external rotation 4- 3+  Knee flexion 4 4  Knee extension 4 4  Ankle dorsiflexion 4- 4-  Ankle plantarflexion    Ankle inversion    Ankle eversion    (Blank rows = not tested)  BED MOBILITY:  SBA  TRANSFERS: Assistive device utilized: None  Sit to stand: Modified independence and SBA Stand to sit: Modified independence and SBA Chair to chair: SBA Floor: NT  GAIT: Distance walked: clinic distances Assistive device utilized: Single point cane, platform RW/2WW, and None Level of assistance: SBA Gait pattern: step through pattern, decreased arm swing- Right, decreased arm swing- Left, decreased stride length, decreased hip/knee flexion- Right, decreased hip/knee flexion- Left, decreased ankle dorsiflexion- Right, decreased ankle dorsiflexion- Left, shuffling, festinating, trunk flexed, poor foot clearance- Right, and poor foot clearance- Left Comments: gait deviations more pronounced with decreasing AD support  FUNCTIONAL TESTS:  5 times sit to stand: unable w/o UE assist - only able to complete 4 reps in 34.03 sec; 20.18 sec with B UE assist Timed up and go (TUG): Normal - 15.84 sec w/o AD, Manual - 15.19 sec w/o AD, Cognitive - 15.66 sec w/o AD 10 meter walk test: 18.25 sec w/o AD, 16.56 sec with SPC, 14.78 sec with platform RW Gait speed: 1.80 ft/sec w/o AD, 1.98 ft/sec with SPC, 2.22 ft/sec with platform RW Berg balance scale: TBA DGI: TBA  PATIENT SURVEYS:  ABC scale: 620 / 1600 = 38.8 %, indicating a low level of physical functioning (<69% indicates risk for recurrent falls in PD)   TODAY'S TREATMENT:    TODAY'S TREATMENT:   08/06/2024 THERAPEUTIC  EXERCISE: To improve strength and endurance.   NuStep - L4 x 8 min (LE only)  THERAPEUTIC ACTIVITIES: To improve functional performance.  Demonstration, verbal and tactile cues throughout for technique. TUG = 19.88 sec with platform 4WW (pt misjudged seat and attempted to sit on armrest of chair), 23.06 sec w/o AD 5xSTS = 20.50 sec with B UE assist, unable w/o UE assist  NEUROMUSCULAR RE-EDUCATION: To improve balance, coordination, kinesthesia, posture, proprioception, reduce fall risk, amplitude of movement, speed of movement to reduce bradykinesia, and reduce rigidity. Seated PWR! Moves: Up x 10 Rock x 10 Twist x 10 Step x 10  PWR! Sit to stand x 7 from Airex pad in chair Standing PWR! Moves (modified with chair in front for intermittent UE support as needed): Up x 10 Rock x 10 - one hand remaining on chair Twist x 10 - one hand remaining on chair Step x 8 - one hand remaining on chair   07/24/2024  THERAPEUTIC EXERCISE: To improve strength, endurance, ROM, and flexibility.  Demonstration, verbal and tactile cues throughout for technique.   - resistance reduced d/t c/o fatigue Gait 270 ft around clinic with walker   NEUROMUSCULAR RE-EDUCATION: To improve coordination, kinesthesia, posture, proprioception Seated:  PWR! Up 2x10  PWR Rock 2x10 each direction  PWR Twist 2x10 each direction  PWR step x 5 each direction very difficult  Sit to stand x 5  07/19/2024  THERAPEUTIC EXERCISE: To improve strength, endurance, ROM, and flexibility.  Demonstration, verbal and tactile cues throughout for technique.  NuStep - L3 x 8 min (LE only) - resistance reduced d/t c/o fatigue Seated marching 2lb 2x10 BLE Seated LAQ 2lb 2x10 BLE Seated hamstring stretch x 30 B Seated piriformis stretch fig 4 x 30 B Gait 90 ft around clinic with walker   NEUROMUSCULAR RE-EDUCATION: To improve coordination, kinesthesia, posture, proprioception Seated diagonals with RTB x 10 B Seated rows GTB  2x10 Seated shoulder ext GTB 2x10 Sit to stands x 5 Standing ant/post WS in staggered stance x 10 B with walker   07/12/2024  THERAPEUTIC EXERCISE: To improve strength, endurance, ROM, and flexibility.  Demonstration, verbal and tactile cues throughout for technique.  NuStep - L3 x 6 min (LE only) - resistance reduced d/t c/o fatigue Seated hip hinge HS stretch 3 x 30 bil Seated hip hinge figure-4 piriformis stretch 3 x 30 bil Seated hip flexor stretch in lunge position over edge of seat 3 x 30 bil  NEUROMUSCULAR RE-EDUCATION: To improve coordination, kinesthesia, posture, proprioception, reduce fall risk, amplitude of movement, speed of movement to reduce bradykinesia, and reduce rigidity. Seated PWR! Moves: Up x 10 Rock x 5 bil Twist - deferred d/t increased shoulder pain Step x 5 bil Seated scapular retraction into pool noodle along spine and back of chair 10 x 3 Seated GTB scapular retraction + B shoulder row 10 x 3 Seated GTB scapular retraction + B shoulder extension 10 x 3   07/09/2024  THERAPEUTIC EXERCISE: To improve strength, endurance, and flexibility.  Demonstration, verbal and tactile cues throughout for technique.  NuStep - L4 x 6 min (x 2' with UE/LE, remaining 4' just LE) Seated hip hinge HS stretch 2 x 30 bil Seated hip hinge figure-4 piriformis stretch 2 x 30 bil Seated hip flexor stretch in lunge position over edge of seat 2 x 30 bil  SELF CARE: Provided education to improve safety with use of platform rollator for assistive device.  Assess platform rollator height, reinforcing upright posture and as close proximity as seat will allow. Adjusted tension on rollator brakes to increase brake engagement against wheel to secure walker during transfers and allow for better control on inclines.  Provided instruction in safe transfer techniques with rollator, ensuring brakes are locked and pushing from/reaching for chair/seat during sit <> stand transition rather  than pulling on walker. Reviewed the Check for Safety - Home Fall Prevention Checklist for Older Adults to help identify fall risk hazards in the home along with strategies to reduce fall risk at home.   07/05/2024 THERAPEUTIC EXERCISE: To improve strength, endurance, and ROM.  Demonstration, verbal and tactile cues throughout for technique.   Gait 300' w/ platform walker  Hooklying Bridges 2x10  S/L Clamshells 2x10 B- L side feels tighter/more difficult  Seated hip add w/ ball squeeze 2x10, 3  Seated hip abd RTB 2x10  Seated Marches x10 B  Alt Seated Marches x20 B  STS from chair x10- fatigue noted by rep 6   NEURO RE-EDUCATION: Focusing on balance, upright posture  Side Stepping 3x10' Fwd Weight Shifts- cued to place weight into leading foot and come up onto heel of opposite foot  Fwd Stepping over foam roll w/ counter support x10  Lateral Stepping over foam roll w/ counter support x10 B  Standing hip abd w/ counter support x10 B- cued to maintain movement w/o letting feet slap the ground  Standing hip ext w/  counter support  x10 B Toe Taps onto shoe box 2x10 B- cued to gently tap the box to ensure controlled motion    06/26/24 Seated hip ADD ball squeeze 10x3; 2 sets Seated hip ABD RTB 2x10 Seated marching RTB 2x10  NEUROMUSCULAR RE-EDUCATION: To improve coordination, kinesthesia, posture, and proprioception.   Standardized Balance Assessment   Standardized Balance Assessment Berg Balance Test;Dynamic Gait Index      Berg Balance Test   Sit to Stand Able to stand  independently using hands    Standing Unsupported Able to stand safely 2 minutes    Sitting with Back Unsupported but Feet Supported on Floor or Stool Able to sit safely and securely 2 minutes    Stand to Sit Sits safely with minimal use of hands    Transfers Able to transfer safely, minor use of hands    Standing Unsupported with Eyes Closed Able to stand 10 seconds safely    Standing Unsupported with Feet  Together Able to place feet together independently and stand 1 minute safely    From Standing, Reach Forward with Outstretched Arm Can reach confidently >25 cm (10)    From Standing Position, Pick up Object from Floor Able to pick up shoe safely and easily    From Standing Position, Turn to Look Behind Over each Shoulder Looks behind from both sides and weight shifts well    Turn 360 Degrees Able to turn 360 degrees safely but slowly    Standing Unsupported, Alternately Place Feet on Step/Stool Needs assistance to keep from falling or unable to try    Standing Unsupported, One Foot in [employer] balance while stepping or standing    Standing on One Leg Unable to try or needs assist to prevent fall    Total Score 41      Dynamic Gait Index   Level Surface Moderate Impairment    Change in Gait Speed Mild Impairment    Gait with Horizontal Head Turns Moderate Impairment    Gait with Vertical Head Turns Mild Impairment    Gait and Pivot Turn Mild Impairment    Step Over Obstacle Moderate Impairment    Step Around Obstacles Mild Impairment    Steps Moderate Impairment    Total Score 12       06/18/2024  SELF CARE:  Reviewed eval findings and role of PT in addressing identified deficits as well as need for further assessment of balance and related fall risk.    PATIENT EDUCATION:  Education details: HEP update - proximal LE stretches, transfer safety, gait safety with platform rollator, and Check for Safety - Home Fall Prevention Checklist for Older Adults   Person educated: Patient Education method: Explanation, Demonstration, Verbal cues, Handouts, and MedBridgeGO app access provided Education comprehension: verbalized understanding, returned demonstration, verbal cues required, and needs further education  HOME EXERCISE PROGRAM: Access Code: [email or web address] URL: [email or web address] Date: 07/12/2024 Prepared by: Elijah Hidden  Exercises - Seated Hip Abduction with  Resistance  - 1 x daily - 3 x weekly - 3 sets - 10 reps - Seated Hip Adduction Isometrics with Ball  - 1 x daily - 3 x weekly - 3 sets - 10 reps - Seated March with Resistance  - 1 x daily - 3 x weekly - 3 sets - 10 reps - Sit to Stand  - 1 x daily - 3 x weekly - 1 sets - 5 reps - Seated Hamstring Stretch  - 1-2 x daily - 7 x weekly -  3 reps - 30 sec hold - Seated Figure 4 Piriformis Stretch  - 1-2 x daily - 7 x weekly - 3 reps - 30 sec hold - Seated Hip Flexor Stretch  - 1-2 x daily - 7 x weekly - 3 reps - 30 sec hold  Patient Education - Check for Safety  PWR Moves: - Sitting - Standing (modified)   ASSESSMENT:  CLINICAL IMPRESSION: Diante feels like he is improving with PT, albeit more slowly that he would like. TUG time actually decreased today and 5xSTS essentially unchanged while still requiring B UE assist. He requested to continue focus on PWR! Moves, with PT providing insight on how PWR! Moves carryover into daily movement and activities.  Reviewed seated PWR! Moves and introduced standing PWR! Moves with modifications to provide UE support for balance and safety, providing HEP handouts for both.  Utilized PWR! Up to assist with increased independence with sit to stand without need for UE assist, although seat height elevated by ~2 with Airex pad to facilitate increased movement.  Patient fatigues more quickly with standing activities requiring intermittent seated rest breaks.  Dillon will benefit from continued skilled PT to address ongoing strength and balance deficits deficits to improve mobility and activity tolerance with decreased pain interference and decreased risk for falls.   OBJECTIVE IMPAIRMENTS: Abnormal gait, decreased activity tolerance, decreased balance, decreased coordination, decreased endurance, decreased knowledge of condition, decreased knowledge of use of DME, decreased mobility, difficulty walking, decreased strength, decreased safety awareness, dizziness, impaired  perceived functional ability, impaired flexibility, improper body mechanics, postural dysfunction, and pain.   ACTIVITY LIMITATIONS: carrying, lifting, bending, standing, squatting, stairs, transfers, bed mobility, reach over head, locomotion level, and caring for others  PARTICIPATION LIMITATIONS: meal prep, cleaning, laundry, shopping, and community activity  PERSONAL FACTORS: Age, Fitness, Past/current experiences, Time since onset of injury/illness/exacerbation, and 3+ comorbidities: Anxiety, asthma, bipolar affective disorder, COPD, depression, DM-II, DJD, chronic knee pain, B TKA, GERD, HTN, Mnire's disease, obesity, pancreatitis, h/o CVA, neurologic dysphagia, tardive dyskinesia, HOH - L deaf & R hearing aid are also affecting patient's functional outcome.   REHAB POTENTIAL: Good  CLINICAL DECISION MAKING: Unstable/unpredictable  EVALUATION COMPLEXITY: High   GOALS: Goals reviewed with patient? Yes  SHORT TERM GOALS: Target date: 07/30/2024  Patient will be independent with initial HEP. Baseline: To be established 07/05/24 - consistent with current HEP  07/12/24 - clarified HEP frequency Goal status: MET - 08/06/24   2.  Patient will demonstrate decreased fall risk by scoring < 13.5 sec on normal TUG. Baseline: 15.84 sec w/o AD Goal status: IN PROGRESS - 08/06/24 - 19.88 sec with platform 4WW (pt misjudged seat and attempted to sit on armrest of chair), 23.06 sec w/o AD  3.  Patient will be educated on strategies to decrease risk of falls.  Baseline:  Goal status: MET - 07/09/24  LONG TERM GOALS: Target date: 09/10/2024  Patient will be independent with ongoing/advanced HEP for self-management at home incorporating PWR! Moves as indicated .  Baseline:  Goal status: INITIAL  2.  Patient will be able to ambulate 600' with LRAD with good safety to access community.  Baseline:  Goal status: INITIAL  3.  Patient will be able to step up/down curb safely with LRAD for safety  with community ambulation.  Baseline:  Goal status: INITIAL   4.  Patient will demonstrate gait speed of >/= 2.62 ft/sec (0.8 m/s) to be a safe limited community ambulator with decreased risk for recurrent falls.  Baseline:  1.80 ft/sec w/o AD, 1.98 ft/sec with SPC and 2.22 ft/sec with platform RW Goal status: IN PROGRESS - 07/19/24 - 2.26 ft/sec  5.  Patient will improve 5x STS time to </= 15 seconds w/o need for UE assist to demonstrate improved functional strength and transfer efficiency. Baseline: unable w/o UE assist - only able to complete 4 reps in 34.03 sec; 20.18 sec with B UE assist Goal status: IN PROGRESS - 08/06/24 - 20.50 sec with B UE assist  6.  Patient will demonstrate at least 19/24 on DGI to improve gait stability and reduce risk for falls. Baseline: TBA Goal status: INITIAL  7.  Patient will improve Berg score by at least 8 points to improve safety and stability with ADLs in standing and reduce risk for falls. (MCID = 8 points)   Baseline: TBA Goal status: INITIAL  8.  Patient will report >/= 52% on ABC scale to demonstrate improved balance confidence and decreased risk for falls. Baseline: 620 / 1600 = 38.8 % Goal status: INITIAL  9. Patient will verbalize understanding of local Parkinson's disease community resources, including community fitness post d/c. Baseline:  Goal status: INITIAL   PLAN:  PT FREQUENCY: 2x/week  PT DURATION: 12 weeks  PLANNED INTERVENTIONS: 97164- PT Re-evaluation, 97750- Physical Performance Testing, 97110-Therapeutic exercises, 97530- Therapeutic activity, [id number]- Neuromuscular re-education, 97535- Self Care, [id number]- Manual therapy, [id number]- Gait training, [phone]- Canalith repositioning, H9716- Electrical stimulation (unattended), [id number]- Ultrasound, 02966- Ionotophoresis 4mg /ml Dexamethasone , 79439 (1-2 muscles), 20561 (3+ muscles)- Dry Needling, Patient/Family education, Balance training, Stair training, Taping, Joint mobilization, Vestibular  training, DME instructions, Cryotherapy, and Moist heat  PLAN FOR NEXT SESSION: continue LE strengthening; balance activities (both static and dynamic); PWR! [address]    Elijah CHRISTELLA Hidden, [place] 08/06/2024, 11:49 AM    Date of referral: 05/31/24 Referring provider: Amon Aloysius BRAVO, MD Referring diagnosis?  G21.11,T43.505A (ICD-10-CM) - Neuroleptic-induced parkinsonism (HCC)  R26.9 (ICD-10-CM) - Gait disorder  R29.6 (ICD-10-CM) - Multiple falls   Treatment diagnosis? (if different than referring diagnosis)  Muscle weakness (generalized)  Unsteadiness on feet  Other abnormalities of gait and mobility  History of falling  What was this (referring dx) caused by? Ongoing Issue and Other: neurodegenerative disease  Nature of Condition: Chronic (continuous duration > 3 months)   Laterality: Both  Current Functional Measure Score: Other ABC Scale: 620 / 1600 = 38.8 %, indicating a low level of physical functioning (<69% indicates risk for recurrent falls in PD)    Objective measurements identify impairments when they are compared to normal values, the uninvolved extremity, and prior level of function.  [x]  Yes  []  No  Objective assessment of functional ability: Moderate functional limitations   Briefly describe symptoms:  Patient feels like he is progressing with PT but may have been overdoing HEP resulting in increased fatigue and muscle soreness, hence perceived slight worsening of condition since onset of therapy.  He continues to demonstrate abnormal posture, decreased flexibility, impaired strength, and impaired balance with high risk for falls indicating need for continued PT.  How did symptoms start: gradual onset  Average pain intensity:  Last 24 hours: 3.5-6.5/10  Past week: 3.5-6.5/10  How often does the pt experience symptoms? Frequently  How much have the symptoms interfered with usual daily activities? Quite a bit  How has condition changed since care began at this  facility? A little worse - sore from exercises  In general, how is the patients overall health? Fair  Onset date: Parkinsonism diagnosis in 2013, worsening  weakness over the past 6 months    BACK PAIN (STarT Back Screening Tool) - (When applicable): N/A  Has your back pain spread down your leg(s) at sometime in the last 2 weeks? []  Yes   []  No Have you had pain in the shoulder or neck at sometime in the past 2 weeks? []  Yes   []  No Have you only walked short distances because of your back pain? []  Yes   []  No In the past 2 weeks, have you dressed more slowly than usual because of your back pain? []  Yes   []  No Do you think it is not really safe for person with a condition like yours to be physically active? []  Yes   []  No Have worrying thoughts been going through your mind a lot of the time? []  Yes   []  No Do you feel that your back pain is terrible and it is never going to get any better? []  Yes   []  No In general, have you stopped enjoying all the things you usually enjoy? []  Yes   []  No Overall, how bothersome has your back pain been in the last 2 weeks? []  Not at all   []  Slightly     []  Moderate   []  Very much     []  Extremely

## 2024-08-09 ENCOUNTER — Ambulatory Visit

## 2024-08-09 DIAGNOSIS — R2681 Unsteadiness on feet: Secondary | ICD-10-CM | POA: Diagnosis not present

## 2024-08-09 DIAGNOSIS — R2689 Other abnormalities of gait and mobility: Secondary | ICD-10-CM | POA: Diagnosis not present

## 2024-08-09 DIAGNOSIS — Z9181 History of falling: Secondary | ICD-10-CM

## 2024-08-09 DIAGNOSIS — M6281 Muscle weakness (generalized): Secondary | ICD-10-CM

## 2024-08-09 NOTE — Therapy (Signed)
 OUTPATIENT PHYSICAL THERAPY [facility]   Patient Name: Ryan Zhang MRN: [id number] DOB:September 04, 1946, 78 y.o., male Today's Date: 08/10/2024   END OF SESSION:  PT End of Session - 08/09/24 1526     Visit Number 9    Date for Recertification  09/10/24    Authorization Type [employer]    Authorization Time Period 07/19/24 - 08/16/24    Authorization - Visit Number 4    Authorization - Number of Visits 6    PT Start Time 1445    PT Stop Time 1531    PT Time Calculation (min) 46 min    Activity Tolerance Patient tolerated treatment well    Behavior During Therapy WFL for tasks assessed/performed                 Past Medical History:  Diagnosis Date   Allergy    allergy shots Dr. Cloretta   Anemia    Anxiety    Asthma    moderate persistant   Bipolar affective (HCC)    Cataract    both eyes   Clotting disorder    Due to blood thinner   COPD (chronic obstructive pulmonary disease) (HCC)    Depression    Diabetes mellitus    DJD (degenerative joint disease)    Dysphagia    Gallstones    hx of, s/p cholecystectomy   GERD (gastroesophageal reflux disease)    Hepatitis A    as teenager 60's   Hollenhorst plaque    right eye   Hyperlipidemia    Hypertension    Kidney stones    hx of   Meniere's disease    Motility disorder, esophageal    Obesity    Pancreatitis    Personal history of colonic polyps 11/2004   hyperplastic.   Stroke [facility])    per MRI   Past Surgical History:  Procedure Laterality Date   CATARACT EXTRACTION Bilateral 09/2015 and 10/2015   CHOLECYSTECTOMY     COLONOSCOPY     EYE SURGERY     JOINT REPLACEMENT     TOTAL KNEE ARTHROPLASTY Bilateral    both knees   Patient Active Problem List   Diagnosis Date Noted   Tinea cruris 07/24/2020   PCP NOTES >>>>>>>>>>>>>>>>>>>>>>>>>>>>>>> 01/12/2016   Folliculitis 05/09/2015   Hearing loss in right ear 08/05/2014   Panic attack as reaction to stress 04/20/2012   Dysphagia,  neurologic 01/07/2012   Morbid obesity (HCC) 01/07/2012   Chest pain, musculoskeletal 12/07/2011   Candidiasis of skin 08/12/2011   Annual physical exam 06/07/2011   OLECRANON BURSITIS 01/29/2011   PARTIAL ARTERIAL OCCLUSION OF RETINA 05/27/2010   Essential hypertension, benign 01/16/2010   ABNORMAL ELECTROCARDIOGRAM 01/14/2010   Asthma 10/14/2009   GERD 10/14/2009   Bipolar disorder (HCC) 02/20/2009   PHIMOSIS 02/20/2009   KNEE PAIN, CHRONIC 11/20/2008   HIP PAIN, RIGHT 08/19/2008   DM type 2 with diabetic peripheral neuropathy (HCC) 02/22/2007   Hyperlipidemia associated with type 2 diabetes mellitus (HCC) 02/22/2007   ALLERGIC RHINITIS, SEASONAL 02/22/2007   LOW BACK PAIN, CHRONIC 02/22/2007   MENIERE'S DISEASE, HX OF 02/22/2007    PCP: Amon Aloysius BRAVO, MD   REFERRING PROVIDER: Amon Aloysius BRAVO, MD  (Neurologist - Evonnie Stabs, MD)  REFERRING DIAG:  [phone] (ICD-10-CM) - Neuroleptic-induced parkinsonism (HCC)  R26.9 (ICD-10-CM) - Gait disorder  R29.6 (ICD-10-CM) - Multiple falls   THERAPY DIAG:  Muscle weakness (generalized)  Unsteadiness on feet  Other abnormalities of gait and mobility  History of falling  RATIONALE FOR EVALUATION AND TREATMENT: Rehabilitation  ONSET DATE: ~2013 - 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 Yes: NPRS scale: 1/10   Pain location: L shoulder, R elbow, wrist, hand and thumb Pain description: sharp in shoulders, sometimes aching  Aggravating factors: being up and moving around  Relieving factors: extra-strength Tylenol  when  pain is really bad (mostly ignores the pain), when really bad, will take hydrocodone  to help him sleep   PERTINENT HISTORY:  Anxiety, asthma, bipolar affective disorder, COPD, depression, DM-II, DJD, chronic knee pain, B TKA, GERD, HTN, Mnire's disease, obesity, pancreatitis, h/o CVA, neurologic dysphagia, tardive dyskinesia, HOH - L deaf & R hearing aid  PRECAUTIONS: Fall  RED FLAGS: None  WEIGHT BEARING RESTRICTIONS: No  FALLS:  Has patient fallen in last 6 months? No and before the levodopa  supplements, falls were daily (sometimes several times a day)  LIVING ENVIRONMENT: Lives with: lives with their spouse and dog Lives in: Apartment Stairs: No Has following equipment at home: Single point cane, Environmental consultant - 2 wheeled, shower chair, and Grab bars (platform RW)  OCCUPATION: Retired  PLOF: Independent, Independent with community mobility with device, and Leisure: watching TV, listening to books, programming, walking ~15 min (around the block) - tries to do daily   PATIENT GOALS: To get back to the point where I can walk to the car unassisted (no AD) and use platform RW less in community.  Be able to assist my wife with bringing groceries in from the car.   OBJECTIVE: (objective measures completed at initial evaluation unless otherwise dated)  DIAGNOSTIC FINDINGS:  N/A - Multiple imaging studies in 2024 s/p falls but no acute fractures.  COGNITION: Overall cognitive status: Impaired and History of cognitive impairments - at baseline - mostly memory issues resulting from ECT 19 yrs ago, better over last 3 months but still notes limited STM   SENSATION: WFL  COORDINATION: B Heel/toe WFL Heel to shin unable bilaterally   POSTURE:  rounded shoulders, forward head, and flexed trunk   MUSCLE LENGTH: Hamstrings: mod/severe tight B Piriformis: mild /mod tight R>L Hip flexors: mod tight  LOWER EXTREMITY ROM:    Grossly WFL except limited hip extension and rotation  bilaterally  LOWER EXTREMITY MMT:    MMT Right eval Left eval  Hip flexion 3+ 3+  Hip extension 3+ 3+  Hip abduction 3- 3  Hip adduction 3+ 3+  Hip internal rotation 4- 3+  Hip external rotation 4- 3+  Knee flexion 4 4  Knee extension 4 4  Ankle dorsiflexion 4- 4-  Ankle plantarflexion    Ankle inversion    Ankle eversion    (Blank rows = not tested)  BED MOBILITY:  SBA  TRANSFERS: Assistive device utilized: None  Sit to stand: Modified independence and SBA Stand to sit: Modified independence and SBA Chair to chair: SBA Floor: NT  GAIT: Distance walked: clinic distances Assistive device utilized: Single point cane, platform RW/2WW, and None Level of assistance: SBA Gait pattern: step through pattern, decreased arm swing- Right, decreased arm swing- Left, decreased stride length, decreased hip/knee flexion- Right, decreased hip/knee flexion- Left, decreased ankle dorsiflexion- Right, decreased ankle dorsiflexion- Left, shuffling, festinating, trunk flexed, poor foot clearance- Right, and poor foot clearance- Left Comments: gait deviations more pronounced with decreasing AD support  FUNCTIONAL TESTS:  5 times sit to stand: unable w/o UE assist - only able to complete 4 reps in 34.03 sec; 20.18 sec with B UE assist Timed up and go (TUG): Normal - 15.84 sec w/o AD, Manual - 15.19 sec w/o AD, Cognitive - 15.66 sec w/o AD 10 meter walk test: 18.25 sec w/o AD, 16.56 sec with SPC, 14.78 sec with platform RW Gait speed: 1.80 ft/sec w/o AD, 1.98 ft/sec with SPC, 2.22 ft/sec with platform RW Berg balance scale: TBA DGI: TBA  PATIENT SURVEYS:  ABC scale: 620 / 1600 = 38.8 %, indicating a low level of physical functioning (<69% indicates risk for recurrent falls in PD)   TODAY'S TREATMENT:  08/09/2024 THERAPEUTIC EXERCISE: To improve strength and endurance.   NuStep - L4 x 8 min (LE only) Gait around clinic with 270'   NEUROMUSCULAR RE-EDUCATION:  With UE support: Standing  heel/toe lift x 10 B Standing hip abduction x 10 B Standing marching x 10 B Standing balance x 1 min; added EC 3x10 Sidesteps 3x down and back  PWR twist x 10   08/06/2024 THERAPEUTIC EXERCISE: To improve strength and endurance.   NuStep - L4 x 8 min (LE only)  THERAPEUTIC ACTIVITIES: To improve functional performance.  Demonstration, verbal and tactile cues throughout for technique. TUG = 19.88 sec with platform 4WW (pt misjudged seat and attempted to sit on armrest of chair), 23.06 sec w/o AD 5xSTS = 20.50 sec with B UE assist, unable w/o UE assist  NEUROMUSCULAR RE-EDUCATION: To improve balance, coordination, kinesthesia, posture, proprioception, reduce fall risk, amplitude of movement, speed of movement to reduce bradykinesia, and reduce rigidity. Seated PWR! Moves: Up x 10 Rock x 10 Twist x 10 Step x 10  PWR! Sit to stand x 7 from Airex pad in chair Standing PWR! Moves (modified with chair in front for intermittent UE support as needed): Up x 10 Rock x 10 - one hand remaining on chair Twist x 10 - one hand remaining on chair Step x 8 - one hand remaining on chair   07/24/2024  THERAPEUTIC EXERCISE: To improve strength, endurance, ROM, and flexibility.  Demonstration, verbal and tactile cues throughout for technique.   - resistance reduced d/t c/o fatigue Gait 270 ft around clinic with walker   NEUROMUSCULAR RE-EDUCATION: To improve  coordination, kinesthesia, posture, proprioception Seated:  PWR! Up 2x10  PWR Rock 2x10 each direction  PWR Twist 2x10 each direction  PWR step x 5 each direction very difficult  Sit to stand x 5   07/19/2024  THERAPEUTIC EXERCISE: To improve strength, endurance, ROM, and flexibility.  Demonstration, verbal and tactile cues throughout for technique.  NuStep - L3 x 8 min (LE only) - resistance reduced d/t c/o fatigue Seated marching 2lb 2x10 BLE Seated LAQ 2lb 2x10 BLE Seated hamstring stretch x 30 B Seated piriformis stretch fig 4 x 30  B Gait 90 ft around clinic with walker   NEUROMUSCULAR RE-EDUCATION: To improve coordination, kinesthesia, posture, proprioception Seated diagonals with RTB x 10 B Seated rows GTB 2x10 Seated shoulder ext GTB 2x10 Sit to stands x 5 Standing ant/post WS in staggered stance x 10 B with walker   07/12/2024  THERAPEUTIC EXERCISE: To improve strength, endurance, ROM, and flexibility.  Demonstration, verbal and tactile cues throughout for technique.  NuStep - L3 x 6 min (LE only) - resistance reduced d/t c/o fatigue Seated hip hinge HS stretch 3 x 30 bil Seated hip hinge figure-4 piriformis stretch 3 x 30 bil Seated hip flexor stretch in lunge position over edge of seat 3 x 30 bil  NEUROMUSCULAR RE-EDUCATION: To improve coordination, kinesthesia, posture, proprioception, reduce fall risk, amplitude of movement, speed of movement to reduce bradykinesia, and reduce rigidity. Seated PWR! Moves: Up x 10 Rock x 5 bil Twist - deferred d/t increased shoulder pain Step x 5 bil Seated scapular retraction into pool noodle along spine and back of chair 10 x 3 Seated GTB scapular retraction + B shoulder row 10 x 3 Seated GTB scapular retraction + B shoulder extension 10 x 3    PATIENT EDUCATION:  Education details: HEP update - proximal LE stretches, transfer safety, gait safety with platform rollator, and Check for Safety - Home Fall Prevention Checklist for Older Adults   Person educated: Patient Education method: Explanation, Demonstration, Verbal cues, Handouts, and MedBridgeGO app access provided Education comprehension: verbalized understanding, returned demonstration, verbal cues required, and needs further education  HOME EXERCISE PROGRAM: Access Code: [email or web address] URL: [email or web address] Date: 07/12/2024 Prepared by: Elijah Hidden  Exercises - Seated Hip Abduction with Resistance  - 1 x daily - 3 x weekly - 3 sets - 10 reps - Seated Hip Adduction Isometrics with  Ball  - 1 x daily - 3 x weekly - 3 sets - 10 reps - Seated March with Resistance  - 1 x daily - 3 x weekly - 3 sets - 10 reps - Sit to Stand  - 1 x daily - 3 x weekly - 1 sets - 5 reps - Seated Hamstring Stretch  - 1-2 x daily - 7 x weekly - 3 reps - 30 sec hold - Seated Figure 4 Piriformis Stretch  - 1-2 x daily - 7 x weekly - 3 reps - 30 sec hold - Seated Hip Flexor Stretch  - 1-2 x daily - 7 x weekly - 3 reps - 30 sec hold  Patient Education - Check for Safety  PWR Moves: - Sitting - Standing (modified)   ASSESSMENT:  CLINICAL IMPRESSION: Pt presents with increased LE fatigue, limiting his ability to ambulate community distances and to stand for longer then ~3 min. He was able to complete all interventions but with many rest breaks given throughout session. Will continue trying to progress with strengthening and balance as able. Livan will benefit from continued skilled  PT to address ongoing strength and balance deficits deficits to improve mobility and activity tolerance with decreased pain interference and decreased risk for falls.   OBJECTIVE IMPAIRMENTS: Abnormal gait, decreased activity tolerance, decreased balance, decreased coordination, decreased endurance, decreased knowledge of condition, decreased knowledge of use of DME, decreased mobility, difficulty walking, decreased strength, decreased safety awareness, dizziness, impaired perceived functional ability, impaired flexibility, improper body mechanics, postural dysfunction, and pain.   ACTIVITY LIMITATIONS: carrying, lifting, bending, standing, squatting, stairs, transfers, bed mobility, reach over head, locomotion level, and caring for others  PARTICIPATION LIMITATIONS: meal prep, cleaning, laundry, shopping, and community activity  PERSONAL FACTORS: Age, Fitness, Past/current experiences, Time since onset of injury/illness/exacerbation, and 3+ comorbidities: Anxiety, asthma, bipolar affective disorder, COPD, depression,  DM-II, DJD, chronic knee pain, B TKA, GERD, HTN, Mnire's disease, obesity, pancreatitis, h/o CVA, neurologic dysphagia, tardive dyskinesia, HOH - L deaf & R hearing aid are also affecting patient's functional outcome.   REHAB POTENTIAL: Good  CLINICAL DECISION MAKING: Unstable/unpredictable  EVALUATION COMPLEXITY: High   GOALS: Goals reviewed with patient? Yes  SHORT TERM GOALS: Target date: 07/30/2024  Patient will be independent with initial HEP. Baseline: To be established 07/05/24 - consistent with current HEP  07/12/24 - clarified HEP frequency Goal status: MET - 08/06/24   2.  Patient will demonstrate decreased fall risk by scoring < 13.5 sec on normal TUG. Baseline: 15.84 sec w/o AD Goal status: IN PROGRESS - 08/06/24 - 19.88 sec with platform 4WW (pt misjudged seat and attempted to sit on armrest of chair), 23.06 sec w/o AD  3.  Patient will be educated on strategies to decrease risk of falls.  Baseline:  Goal status: MET - 07/09/24  LONG TERM GOALS: Target date: 09/10/2024  Patient will be independent with ongoing/advanced HEP for self-management at home incorporating PWR! Moves as indicated .  Baseline:  Goal status: INITIAL  2.  Patient will be able to ambulate 600' with LRAD with good safety to access community.  Baseline:  Goal status: IN PROGRESS- 08/09/24 270' with standing walker  3.  Patient will be able to step up/down curb safely with LRAD for safety with community ambulation.  Baseline:  Goal status: INITIAL   4.  Patient will demonstrate gait speed of >/= 2.62 ft/sec (0.8 m/s) to be a safe limited community ambulator with decreased risk for recurrent falls.  Baseline: 1.80 ft/sec w/o AD, 1.98 ft/sec with SPC and 2.22 ft/sec with platform RW Goal status: IN PROGRESS - 07/19/24 - 2.26 ft/sec  5.  Patient will improve 5x STS time to </= 15 seconds w/o need for UE assist to demonstrate improved functional strength and transfer efficiency. Baseline: unable w/o  UE assist - only able to complete 4 reps in 34.03 sec; 20.18 sec with B UE assist Goal status: IN PROGRESS - 08/06/24 - 20.50 sec with B UE assist  6.  Patient will demonstrate at least 19/24 on DGI to improve gait stability and reduce risk for falls. Baseline: TBA Goal status: INITIAL  7.  Patient will improve Berg score by at least 8 points to improve safety and stability with ADLs in standing and reduce risk for falls. (MCID = 8 points)   Baseline: TBA Goal status: INITIAL  8.  Patient will report >/= 52% on ABC scale to demonstrate improved balance confidence and decreased risk for falls. Baseline: 620 / 1600 = 38.8 % Goal status: INITIAL  9. Patient will verbalize understanding of local Parkinson's disease community resources, including community fitness  post d/c. Baseline:  Goal status: INITIAL   PLAN:  PT FREQUENCY: 2x/week  PT DURATION: 12 weeks  PLANNED INTERVENTIONS: 97164- PT Re-evaluation, 97750- Physical Performance Testing, 97110-Therapeutic exercises, 97530- Therapeutic activity, [id number]- Neuromuscular re-education, 97535- Self Care, [id number]- Manual therapy, [id number]- Gait training, [phone]- Canalith repositioning, [id number]- Electrical stimulation (unattended), 97035- Ultrasound, 02966- Ionotophoresis 4mg /ml Dexamethasone , 79439 (1-2 muscles), 20561 (3+ muscles)- Dry Needling, Patient/Family education, Balance training, Stair training, Taping, Joint mobilization, Vestibular training, DME instructions, Cryotherapy, and Moist heat  PLAN FOR NEXT SESSION: continue LE strengthening; balance activities (both static and dynamic); PWR! Moves    Suann Klier L Taysia Rivere, PTA  08/10/2024, 11:48 AM    Date of referral: 05/31/24 Referring provider: Amon Aloysius BRAVO, MD Referring diagnosis?  G21.11,T43.505A (ICD-10-CM) - Neuroleptic-induced parkinsonism (HCC)  R26.9 (ICD-10-CM) - Gait disorder  R29.6 (ICD-10-CM) - Multiple falls   Treatment diagnosis? (if different than referring diagnosis)  Muscle  weakness (generalized)  Unsteadiness on feet  Other abnormalities of gait and mobility  History of falling  What was this (referring dx) caused by? Ongoing Issue and Other: neurodegenerative disease  Nature of Condition: Chronic (continuous duration > 3 months)   Laterality: Both  Current Functional Measure Score: Other ABC Scale: 620 / 1600 = 38.8 %, indicating a low level of physical functioning (<69% indicates risk for recurrent falls in PD)    Objective measurements identify impairments when they are compared to normal values, the uninvolved extremity, and prior level of function.  [x]  Yes  []  No  Objective assessment of functional ability: Moderate functional limitations   Briefly describe symptoms:  Patient feels like he is progressing with PT but may have been overdoing HEP resulting in increased fatigue and muscle soreness, hence perceived slight worsening of condition since onset of therapy.  He continues to demonstrate abnormal posture, decreased flexibility, impaired strength, and impaired balance with high risk for falls indicating need for continued PT.  How did symptoms start: gradual onset  Average pain intensity:  Last 24 hours: 3.5-6.5/10  Past week: 3.5-6.5/10  How often does the pt experience symptoms? Frequently  How much have the symptoms interfered with usual daily activities? Quite a bit  How has condition changed since care began at this facility? A little worse - sore from exercises  In general, how is the patients overall health? Fair  Onset date: Parkinsonism diagnosis in 2013, worsening weakness over the past 6 months    BACK PAIN (STarT Back Screening Tool) - (When applicable): N/A  Has your back pain spread down your leg(s) at sometime in the last 2 weeks? []  Yes   []  No Have you had pain in the shoulder or neck at sometime in the past 2 weeks? []  Yes   []  No Have you only walked short distances because of your back pain? []  Yes   []   No In the past 2 weeks, have you dressed more slowly than usual because of your back pain? []  Yes   []  No Do you think it is not really safe for person with a condition like yours to be physically active? []  Yes   []  No Have worrying thoughts been going through your mind a lot of the time? []  Yes   []  No Do you feel that your back pain is terrible and it is never going to get any better? []  Yes   []  No In general, have you stopped enjoying all the things you usually enjoy? []  Yes   []  No Overall, how  bothersome has your back pain been in the last 2 weeks? []  Not at all   []  Slightly     []  Moderate   []  Very much     []  Extremely

## 2024-08-13 ENCOUNTER — Ambulatory Visit

## 2024-08-13 DIAGNOSIS — R2681 Unsteadiness on feet: Secondary | ICD-10-CM | POA: Diagnosis not present

## 2024-08-13 DIAGNOSIS — J300 Vasomotor rhinitis: Secondary | ICD-10-CM | POA: Diagnosis not present

## 2024-08-13 DIAGNOSIS — R1310 Dysphagia, unspecified: Secondary | ICD-10-CM | POA: Diagnosis not present

## 2024-08-13 DIAGNOSIS — J3089 Other allergic rhinitis: Secondary | ICD-10-CM | POA: Diagnosis not present

## 2024-08-13 DIAGNOSIS — M6281 Muscle weakness (generalized): Secondary | ICD-10-CM

## 2024-08-13 DIAGNOSIS — J4540 Moderate persistent asthma, uncomplicated: Secondary | ICD-10-CM | POA: Diagnosis not present

## 2024-08-13 DIAGNOSIS — Z9181 History of falling: Secondary | ICD-10-CM

## 2024-08-13 DIAGNOSIS — R2689 Other abnormalities of gait and mobility: Secondary | ICD-10-CM | POA: Diagnosis not present

## 2024-08-13 DIAGNOSIS — J3081 Allergic rhinitis due to animal (cat) (dog) hair and dander: Secondary | ICD-10-CM | POA: Diagnosis not present

## 2024-08-13 DIAGNOSIS — J301 Allergic rhinitis due to pollen: Secondary | ICD-10-CM | POA: Diagnosis not present

## 2024-08-13 NOTE — Therapy (Addendum)
 OUTPATIENT PHYSICAL THERAPY [facility]  Progress Note  Reporting Period 06/18/2024 to 08/13/2024   See note below for Objective Data and Assessment of Progress/Goals.     Patient Name: Ryan Zhang MRN: [id number] DOB:[birth year], 78 y.o., male Today's Date: 08/13/2024   END OF SESSION:  PT End of Session - 08/13/24 1152     Visit Number 10    Date for Recertification  09/10/24    Authorization Type [employer] Medicare    Authorization Time Period 07/19/24 - 08/16/24    Authorization - Visit Number 5    Authorization - Number of Visits 6    PT Start Time 1100    PT Stop Time 1142    PT Time Calculation (min) 42 min    Activity Tolerance Patient tolerated treatment well    Behavior During Therapy WFL for tasks assessed/performed                  Past Medical History:  Diagnosis Date   Allergy    allergy shots Dr. Cloretta   Anemia    Anxiety    Asthma    moderate persistant   Bipolar affective (HCC)    Cataract    both eyes   Clotting disorder    Due to blood thinner   COPD (chronic obstructive pulmonary disease) (HCC)    Depression    Diabetes mellitus    DJD (degenerative joint disease)    Dysphagia    Gallstones    hx of, s/p cholecystectomy   GERD (gastroesophageal reflux disease)    Hepatitis A    as teenager 60's   Hollenhorst plaque    right eye   Hyperlipidemia    Hypertension    Kidney stones    hx of   Meniere's disease    Motility disorder, esophageal    Obesity    Pancreatitis    Personal history of colonic polyps 11/2004   hyperplastic.   Stroke [facility])    per MRI   Past Surgical History:  Procedure Laterality Date   CATARACT EXTRACTION Bilateral 09/2015 and 10/2015   CHOLECYSTECTOMY     COLONOSCOPY     EYE SURGERY     JOINT REPLACEMENT     TOTAL KNEE ARTHROPLASTY Bilateral    both knees   Patient Active Problem List   Diagnosis Date Noted   Tinea cruris 07/24/2020   PCP NOTES >>>>>>>>>>>>>>>>>>>>>>>>>>>>>>> 01/12/2016    Folliculitis 05/09/2015   Hearing loss in right ear 08/05/2014   Panic attack as reaction to stress 04/20/2012   Dysphagia, neurologic 01/07/2012   Morbid obesity (HCC) 01/07/2012   Chest pain, musculoskeletal 12/07/2011   Candidiasis of skin 08/12/2011   Annual physical exam 06/07/2011   OLECRANON BURSITIS 01/29/2011   PARTIAL ARTERIAL OCCLUSION OF RETINA 05/27/2010   Essential hypertension, benign 01/16/2010   ABNORMAL ELECTROCARDIOGRAM 01/14/2010   Asthma 10/14/2009   GERD 10/14/2009   Bipolar disorder (HCC) 02/20/2009   PHIMOSIS 02/20/2009   KNEE PAIN, CHRONIC 11/20/2008   HIP PAIN, RIGHT 08/19/2008   DM type 2 with diabetic peripheral neuropathy (HCC) 02/22/2007   Hyperlipidemia associated with type 2 diabetes mellitus (HCC) 02/22/2007   ALLERGIC RHINITIS, SEASONAL 02/22/2007   LOW BACK PAIN, CHRONIC 02/22/2007   MENIERE'S DISEASE, HX OF 02/22/2007    PCP: Amon Aloysius BRAVO, MD   REFERRING PROVIDER: Amon Aloysius BRAVO, MD  (Neurologist - Evonnie Stabs, MD)  REFERRING DIAG:  [phone] (ICD-10-CM) - Neuroleptic-induced parkinsonism (HCC)  R26.9 (ICD-10-CM) - Gait disorder  R29.6 (ICD-10-CM) - Multiple falls   THERAPY DIAG:  Muscle weakness (generalized)  Unsteadiness on feet  Other abnormalities of gait and mobility  History of falling  RATIONALE FOR EVALUATION AND TREATMENT: Rehabilitation  ONSET DATE: ~2013 - 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  Pt accompanied by: self  PAIN: Are you having pain? Yes: NPRS scale: 2/10   Pain location: L shoulder, R elbow, wrist, hand and  thumb Pain description: sharp in shoulders, sometimes aching  Aggravating factors: being up and moving around  Relieving factors: extra-strength Tylenol  when pain is really bad (mostly ignores the pain), when really bad, will take hydrocodone  to help him sleep   PERTINENT HISTORY:  Anxiety, asthma, bipolar affective disorder, COPD, depression, DM-II, DJD, chronic knee pain, B TKA, GERD, HTN, Mnire's disease, obesity, pancreatitis, h/o CVA, neurologic dysphagia, tardive dyskinesia, HOH - L deaf & R hearing aid  PRECAUTIONS: Fall  RED FLAGS: None  WEIGHT BEARING RESTRICTIONS: No  FALLS:  Has patient fallen in last 6 months? No and before the levodopa  supplements, falls were daily (sometimes several times a day)  LIVING ENVIRONMENT: Lives with: lives with their spouse and dog Lives in: Apartment Stairs: No Has following equipment at home: Single point cane, Environmental consultant - 2 wheeled, shower chair, and Grab bars (platform RW)  OCCUPATION: Retired  PLOF: Independent, Independent with community mobility with device, and Leisure: watching TV, listening to books, programming, walking ~15 min (around the block) - tries to do daily   PATIENT GOALS: To get back to the point where I can walk to the car unassisted (no AD) and use platform RW less in community.  Be able to assist my wife with bringing groceries in from the car.   OBJECTIVE: (objective measures completed at initial evaluation unless otherwise dated)  DIAGNOSTIC FINDINGS:  N/A - Multiple imaging studies in 2024 s/p falls but no acute fractures.  COGNITION: Overall cognitive status: Impaired and History of cognitive impairments - at baseline - mostly memory issues resulting from ECT 19 yrs ago, better over last 3 months but still notes limited STM   SENSATION: WFL  COORDINATION: B Heel/toe WFL Heel to shin  unable bilaterally   POSTURE:  rounded shoulders, forward head, and flexed trunk   MUSCLE LENGTH: Hamstrings:  mod/severe tight B Piriformis: mild /mod tight R>L Hip flexors: mod tight   LOWER EXTREMITY ROM:    Grossly WFL except limited hip extension and rotation bilaterally  LOWER EXTREMITY MMT:    MMT Right eval Left eval  Hip flexion 3+ 3+  Hip extension 3+ 3+  Hip abduction 3- 3  Hip adduction 3+ 3+  Hip internal rotation 4- 3+  Hip external rotation 4- 3+  Knee flexion 4 4  Knee extension 4 4  Ankle dorsiflexion 4- 4-  Ankle plantarflexion    Ankle inversion    Ankle eversion    (Blank rows = not tested)  BED MOBILITY:  SBA  TRANSFERS: Assistive device utilized: None  Sit to stand: Modified independence and SBA Stand to sit: Modified independence and SBA Chair to chair: SBA Floor: NT  GAIT: Distance walked: clinic distances Assistive device utilized: Single point cane, platform RW/2WW, and None Level of assistance: SBA Gait pattern: step through pattern, decreased arm swing- Right, decreased arm swing- Left, decreased stride length, decreased hip/knee flexion- Right, decreased hip/knee flexion- Left, decreased ankle dorsiflexion- Right, decreased ankle dorsiflexion- Left, shuffling, festinating, trunk flexed, poor foot clearance- Right, and poor foot clearance- Left Comments: gait deviations more pronounced with decreasing AD support  FUNCTIONAL TESTS:  5 times sit to stand: unable w/o UE assist - only able to complete 4 reps in 34.03 sec; 20.18 sec with B UE assist Timed up and go (TUG): Normal - 15.84 sec w/o AD, Manual - 15.19 sec w/o AD, Cognitive - 15.66 sec w/o AD 10 meter walk test: 18.25 sec w/o AD, 16.56 sec with SPC, 14.78 sec with platform RW Gait speed: 1.80 ft/sec w/o AD, 1.98 ft/sec with SPC, 2.22 ft/sec with platform RW Berg balance scale: 41/56, 37-45 indicates significant (>80%) risk for falls (06/26/24) DGI: 12/24, <19/24 indicates high risk for falls (06/26/24)  PATIENT SURVEYS:  ABC scale: 620 / 1600 = 38.8 %, indicating a low level of physical  functioning (<69% indicates risk for recurrent falls in PD)   TODAY'S TREATMENT:   08/13/24 Nustep L5x80min LE only ABC scale: 810 / 1600 = 50.6 % TUG = 21.01 sec with platform 4WW Completed BERG & DGI -- see below  Berg Balance Test   Sit to Stand Able to stand  independently using hands    Standing Unsupported Able to stand safely 2 minutes    Sitting with Back Unsupported but Feet Supported on Floor or Stool Able to sit safely and securely 2 minutes    Stand to Sit Sits safely with minimal use of hands    Transfers Able to transfer safely, minor use of hands    Standing Unsupported with Eyes Closed Able to stand 10 seconds safely    Standing Unsupported with Feet Together Able to place feet together independently and stand 1 minute safely    From Standing, Reach Forward with Outstretched Arm Can reach confidently >25 cm (10)    From Standing Position, Pick up Object from Floor Able to pick up shoe safely and easily    From Standing Position, Turn to Look Behind Over each Shoulder Looks behind from both sides and weight shifts well    Turn 360 Degrees Able to turn 360 degrees safely one side only in 4 seconds or less    Standing Unsupported, Alternately Place Feet on Step/Stool Able to stand independently and complete  8 steps >20 seconds    Standing Unsupported, One Foot in Front Able to take small step independently and hold 30 seconds    Standing on One Leg Unable to try or needs assist to prevent fall    Total Score 47      Dynamic Gait Index   Level Surface Moderate Impairment    Change in Gait Speed Mild Impairment    Gait with Horizontal Head Turns Mild Impairment    Gait with Vertical Head Turns Mild Impairment    Gait and Pivot Turn Normal    Step Over Obstacle Mild Impairment    Step Around Obstacles Normal    Steps Moderate Impairment    Total Score 16       08/09/2024 THERAPEUTIC EXERCISE: To improve strength and endurance.   NuStep - L4 x 8 min (LE only) Gait  around clinic with 270'  NEUROMUSCULAR RE-EDUCATION:  With UE support: Standing heel/toe lift x 10 B Standing hip abduction x 10 B Standing marching x 10 B Standing balance x 1 min; added EC 3x10 Sidesteps 3x down and back  PWR twist x 10    08/06/2024 THERAPEUTIC EXERCISE: To improve strength and endurance.   NuStep - L4 x 8 min (LE only)  THERAPEUTIC ACTIVITIES: To improve functional performance.  Demonstration, verbal and tactile cues throughout for technique. TUG = 19.88 sec with platform 4WW (pt misjudged seat and attempted to sit on armrest of chair), 23.06 sec w/o AD 5xSTS = 20.50 sec with B UE assist, unable w/o UE assist  NEUROMUSCULAR RE-EDUCATION: To improve balance, coordination, kinesthesia, posture, proprioception, reduce fall risk, amplitude of movement, speed of movement to reduce bradykinesia, and reduce rigidity. Seated PWR! Moves: Up x 10 Rock x 10 Twist x 10 Step x 10  PWR! Sit to stand x 7 from Airex pad in chair Standing PWR! Moves (modified with chair in front for intermittent UE support as needed): Up x 10 Rock x 10 - one hand remaining on chair Twist x 10 - one hand remaining on chair Step x 8 - one hand remaining on chair   07/24/2024  THERAPEUTIC EXERCISE: To improve strength, endurance, ROM, and flexibility.  Demonstration, verbal and tactile cues throughout for technique.   - resistance reduced d/t c/o fatigue Gait 270 ft around clinic with walker   NEUROMUSCULAR RE-EDUCATION: To improve coordination, kinesthesia, posture, proprioception Seated:  PWR! Up 2x10  PWR Rock 2x10 each direction  PWR Twist 2x10 each direction  PWR step x 5 each direction very difficult  Sit to stand x 5   07/19/2024  THERAPEUTIC EXERCISE: To improve strength, endurance, ROM, and flexibility.  Demonstration, verbal and tactile cues throughout for technique.  NuStep - L3 x 8 min (LE only) - resistance reduced d/t c/o fatigue Seated marching 2lb 2x10 BLE Seated LAQ  2lb 2x10 BLE Seated hamstring stretch x 30 B Seated piriformis stretch fig 4 x 30 B Gait 90 ft around clinic with walker   NEUROMUSCULAR RE-EDUCATION: To improve coordination, kinesthesia, posture, proprioception Seated diagonals with RTB x 10 B Seated rows GTB 2x10 Seated shoulder ext GTB 2x10 Sit to stands x 5 Standing ant/post WS in staggered stance x 10 B with walker   07/12/2024  THERAPEUTIC EXERCISE: To improve strength, endurance, ROM, and flexibility.  Demonstration, verbal and tactile cues throughout for technique.  NuStep - L3 x 6 min (LE only) - resistance reduced d/t c/o fatigue Seated hip hinge HS stretch 3 x 30 bil Seated hip  hinge figure-4 piriformis stretch 3 x 30 bil Seated hip flexor stretch in lunge position over edge of seat 3 x 30 bil  NEUROMUSCULAR RE-EDUCATION: To improve coordination, kinesthesia, posture, proprioception, reduce fall risk, amplitude of movement, speed of movement to reduce bradykinesia, and reduce rigidity. Seated PWR! Moves: Up x 10 Rock x 5 bil Twist - deferred d/t increased shoulder pain Step x 5 bil Seated scapular retraction into pool noodle along spine and back of chair 10 x 3 Seated GTB scapular retraction + B shoulder row 10 x 3 Seated GTB scapular retraction + B shoulder extension 10 x 3    PATIENT EDUCATION:  Education details: HEP update - proximal LE stretches, transfer safety, gait safety with platform rollator, and Check for Safety - Home Fall Prevention Checklist for Older Adults   Person educated: Patient Education method: Explanation, Demonstration, Verbal cues, Handouts, and MedBridgeGO app access provided Education comprehension: verbalized understanding, returned demonstration, verbal cues required, and needs further education  HOME EXERCISE PROGRAM: Access Code: [email or web address] URL: [email or web address] Date: 07/12/2024 Prepared by: Elijah Hidden  Exercises - Seated Hip Abduction with Resistance   - 1 x daily - 3 x weekly - 3 sets - 10 reps - Seated Hip Adduction Isometrics with Ball  - 1 x daily - 3 x weekly - 3 sets - 10 reps - Seated March with Resistance  - 1 x daily - 3 x weekly - 3 sets - 10 reps - Sit to Stand  - 1 x daily - 3 x weekly - 1 sets - 5 reps - Seated Hamstring Stretch  - 1-2 x daily - 7 x weekly - 3 reps - 30 sec hold - Seated Figure 4 Piriformis Stretch  - 1-2 x daily - 7 x weekly - 3 reps - 30 sec hold - Seated Hip Flexor Stretch  - 1-2 x daily - 7 x weekly - 3 reps - 30 sec hold  Patient Education - Check for Safety  PWR Moves: - Sitting - Standing (modified)   ASSESSMENT:  CLINICAL IMPRESSION: Pt demonstrates improved balance based on BERG and DGI scores today. BERG improved by 6 points and DGI by 4 points. Today he showed improvement in ability to stand for longer without breaks. He is still indicated as a fall risk despite improvements in balance test. Would recommend continued PT to address continued balance deficits, deficits limiting endurance with standing, and sx d/t Parkinsonism. Keller will benefit from continued skilled PT to address ongoing strength and balance deficits deficits to improve mobility and activity tolerance with decreased pain interference and decreased risk for falls.   OBJECTIVE IMPAIRMENTS: Abnormal gait, decreased activity tolerance, decreased balance, decreased coordination, decreased endurance, decreased knowledge of condition, decreased knowledge of use of DME, decreased mobility, difficulty walking, decreased strength, decreased safety awareness, dizziness, impaired perceived functional ability, impaired flexibility, improper body mechanics, postural dysfunction, and pain.   ACTIVITY LIMITATIONS: carrying, lifting, bending, standing, squatting, stairs, transfers, bed mobility, reach over head, locomotion level, and caring for others  PARTICIPATION LIMITATIONS: meal prep, cleaning, laundry, shopping, and community  activity  PERSONAL FACTORS: Age, Fitness, Past/current experiences, Time since onset of injury/illness/exacerbation, and 3+ comorbidities: Anxiety, asthma, bipolar affective disorder, COPD, depression, DM-II, DJD, chronic knee pain, B TKA, GERD, HTN, Mnire's disease, obesity, pancreatitis, h/o CVA, neurologic dysphagia, tardive dyskinesia, HOH - L deaf & R hearing aid are also affecting patient's functional outcome.   REHAB POTENTIAL: Good  CLINICAL DECISION MAKING: Unstable/unpredictable  EVALUATION COMPLEXITY: High   GOALS:  Goals reviewed with patient? Yes  SHORT TERM GOALS: Target date: 07/30/2024  Patient will be independent with initial HEP. Baseline: To be established 07/05/24 - consistent with current HEP  07/12/24 - clarified HEP frequency Goal status: MET - 08/06/24   2.  Patient will demonstrate decreased fall risk by scoring < 13.5 sec on normal TUG. Baseline: 15.84 sec w/o AD 08/06/24 - 19.88 sec with platform 4WW (pt misjudged seat and attempted to sit on armrest of chair), 23.06 sec w/o AD Goal status: IN PROGRESS - 08/13/24 - 21.01 sec with platform 4WW  3.  Patient will be educated on strategies to decrease risk of falls.  Baseline:  Goal status: MET - 07/09/24   LONG TERM GOALS: Target date: 09/10/2024  Patient will be independent with ongoing/advanced HEP for self-management at home incorporating PWR! Moves as indicated .  Baseline:  Goal status: IN PROGRESS- met for current HEP 08/13/24  2.  Patient will be able to ambulate 600' with LRAD with good safety to access community.  Baseline:  Goal status: IN PROGRESS- 08/09/24 270' with standing walker  3.  Patient will be able to step up/down curb safely with LRAD for safety with community ambulation.  Baseline:  Goal status: IN PROGRESS   4.  Patient will demonstrate gait speed of >/= 2.62 ft/sec (0.8 m/s) to be a safe limited community ambulator with decreased risk for recurrent falls.  Baseline: 1.80 ft/sec  w/o AD, 1.98 ft/sec with SPC and 2.22 ft/sec with platform RW Goal status: IN PROGRESS - 07/19/24 - 2.26 ft/sec  5.  Patient will improve 5x STS time to </= 15 seconds w/o need for UE assist to demonstrate improved functional strength and transfer efficiency. Baseline: unable w/o UE assist - only able to complete 4 reps in 34.03 sec; 20.18 sec with B UE assist Goal status: IN PROGRESS - 08/06/24 - 20.50 sec with B UE assist  6.  Patient will demonstrate at least 19/24 on DGI to improve gait stability and reduce risk for falls. Baseline: 12/24 (06/26/24) Goal status: IN PROGRESS - 08/13/24 - 16/24  7.  Patient will improve Berg score by at least 8 points to improve safety and stability with ADLs in standing and reduce risk for falls. (MCID = 8 points)   Baseline: 41/56 (06/26/24) Goal status: IN PROGRESS - 08/13/24 - 47/56  8.  Patient will report >/= 52% on ABC scale to demonstrate improved balance confidence and decreased risk for falls. Baseline: 620 / 1600 = 38.8 % Goal status: IN PROGRESS - 08/13/24 - 810 / 1600 = 50.6 %  9. Patient will verbalize understanding of local Parkinson's disease community resources, including community fitness post d/c. Baseline:  Goal status: IN PROGRESS   PLAN:  PT FREQUENCY: 2x/week  PT DURATION: 12 weeks  PLANNED INTERVENTIONS: 97164- PT Re-evaluation, 97750- Physical Performance Testing, 97110-Therapeutic exercises, 97530- Therapeutic activity, [id number]- Neuromuscular re-education, 97535- Self Care, 02859- Manual therapy, [id number]- Gait training, [phone]- Canalith repositioning, [id number]- Electrical stimulation (unattended), 97035- Ultrasound, 02966- Ionotophoresis 4mg /ml Dexamethasone , 79439 (1-2 muscles), 20561 (3+ muscles)- Dry Needling, Patient/Family education, Balance training, Stair training, Taping, Joint mobilization, Vestibular training, DME instructions, Cryotherapy, and Moist heat  PLAN FOR NEXT SESSION: need to assess curb negotiation; resubmit for more  visits; continue LE strengthening; balance activities (both static and dynamic); PWR! Moves    Jalan Fariss L Marijane Trower, PTA  08/13/2024, 11:52 AM   Norleen is progressing with PT demonstrating improving tolerance for standing activities, improving safety with gait with his  platform [phone], and decreasing fall risk per improvements in Berg from 41/56 to 47/56 (indicating a reduction in fall risk from significant (greater than 80%) to moderate moderate (>50%) and DGI from 12/24 to 16/24 (scores of <19/24 still indicating high risk for falls) today.  He has not had any falls since starting PT.  Lexus will benefit from continued skilled PT to address ongoing motor control, strength and balance deficits to improve mobility and activity tolerance with decreased pain interference and decreased risk for falls.   Elijah EMERSON Hidden, PT 08/13/2024, 8:08 PM  [facility] [address]  [address] [place], [place], [address] Phone: [phone]   Fax:  [phone]     Date of referral: 05/31/24 Referring provider: Amon Aloysius BRAVO, MD Referring diagnosis?  G21.11,T43.505A (ICD-10-CM) - Neuroleptic-induced parkinsonism (HCC)  R26.9 (ICD-10-CM) - Gait disorder  R29.6 (ICD-10-CM) - Multiple falls   Treatment diagnosis? (if different than referring diagnosis)  Muscle weakness (generalized)  Unsteadiness on feet  Other abnormalities of gait and mobility  History of falling  What was this (referring dx) caused by? Ongoing Issue and Other: neurodegenerative disease  Nature of Condition: Chronic (continuous duration > 3 months)   Laterality: Both  Current Functional Measure Score: Other ABC Scale: 810 / 1600 = 50.6 %, indicating a moderate level of physical functioning (<69% indicates risk for recurrent falls in PD)    Objective measurements identify impairments when they are compared to normal values, the uninvolved extremity, and prior level of function.  [x]   Yes  []  No  Objective assessment of functional ability: Moderate functional limitations   Briefly describe symptoms:  Duwan is progressing with PT demonstrating improving tolerance for standing activities, improving safety with gait with his platform 4WW, and decreasing fall risk per improvements in Berg from 41/56 to 47/56 (indicating a reduction in fall risk from significant (greater than 80%) to moderate moderate (>50%) and DGI from 12/24 to 16/24 (scores of <19/24 still indicating high risk for falls) today.  He has not had any falls since starting PT.  Sekai will benefit from continued skilled PT to address ongoing motor control, strength and balance deficits to improve mobility and activity tolerance with decreased pain interference and decreased risk for falls.   How did symptoms start: gradual onset  Average pain intensity:  Last 24 hours: 3.5-6.5/10  Past week: 3.5-6.5/10  How often does the pt experience symptoms? Frequently  How much have the symptoms interfered with usual daily activities? Quite a bit  How has condition changed since care began at this facility? A little worse - sore from exercises  In general, how is the patients overall health? Fair  Onset date: Parkinsonism diagnosis in 2013, worsening weakness over the past 6 months    BACK PAIN (STarT Back Screening Tool) - (When applicable): N/A  Has your back pain spread down your leg(s) at sometime in the last 2 weeks? []  Yes   []  No Have you had pain in the shoulder or neck at sometime in the past 2 weeks? []  Yes   []  No Have you only walked short distances because of your back pain? []  Yes   []  No In the past 2 weeks, have you dressed more slowly than usual because of your back pain? []  Yes   []  No Do you think it is not really safe for person with a condition like yours to be physically active? []  Yes   []  No Have worrying  thoughts been going through your mind a lot of the time? []  Yes   []  No Do you feel  that your back pain is terrible and it is never going to get any better? []  Yes   []  No In general, have you stopped enjoying all the things you usually enjoy? []  Yes   []  No Overall, how bothersome has your back pain been in the last 2 weeks? []  Not at all   []  Slightly     []  Moderate   []  Very much     []  Extremely

## 2024-08-16 ENCOUNTER — Ambulatory Visit: Attending: Internal Medicine | Admitting: Physical Therapy

## 2024-08-16 ENCOUNTER — Encounter: Payer: Self-pay | Admitting: Physical Therapy

## 2024-08-16 DIAGNOSIS — M6281 Muscle weakness (generalized): Secondary | ICD-10-CM | POA: Insufficient documentation

## 2024-08-16 DIAGNOSIS — R2689 Other abnormalities of gait and mobility: Secondary | ICD-10-CM | POA: Diagnosis present

## 2024-08-16 DIAGNOSIS — Z9181 History of falling: Secondary | ICD-10-CM | POA: Insufficient documentation

## 2024-08-16 DIAGNOSIS — R2681 Unsteadiness on feet: Secondary | ICD-10-CM | POA: Insufficient documentation

## 2024-08-16 NOTE — Therapy (Signed)
 OUTPATIENT PHYSICAL THERAPY [facility]    Patient Name: Ryan Zhang MRN: [id number] DOB:[birth year], 78 y.o., male Today's Date: 08/16/2024   END OF SESSION:  PT End of Session - 08/16/24 1103     Visit Number 11    Date for Recertification  09/10/24    Authorization Type [employer]    Authorization Time Period 07/19/24 - 08/16/24    Authorization - Visit Number 6    Authorization - Number of Visits 6    PT Start Time 1103    PT Stop Time 1145    PT Time Calculation (min) 42 min    Activity Tolerance Patient tolerated treatment well    Behavior During Therapy WFL for tasks assessed/performed                  Past Medical History:  Diagnosis Date   Allergy    allergy shots Dr. Cloretta   Anemia    Anxiety    Asthma    moderate persistant   Bipolar affective (HCC)    Cataract    both eyes   Clotting disorder    Due to blood thinner   COPD (chronic obstructive pulmonary disease) (HCC)    Depression    Diabetes mellitus    DJD (degenerative joint disease)    Dysphagia    Gallstones    hx of, s/p cholecystectomy   GERD (gastroesophageal reflux disease)    Hepatitis A    as teenager 60's   Hollenhorst plaque    right eye   Hyperlipidemia    Hypertension    Kidney stones    hx of   Meniere's disease    Motility disorder, esophageal    Obesity    Pancreatitis    Personal history of colonic polyps 11/2004   hyperplastic.   Stroke [facility])    per MRI   Past Surgical History:  Procedure Laterality Date   CATARACT EXTRACTION Bilateral 09/2015 and 10/2015   CHOLECYSTECTOMY     COLONOSCOPY     EYE SURGERY     JOINT REPLACEMENT     TOTAL KNEE ARTHROPLASTY Bilateral    both knees   Patient Active Problem List   Diagnosis Date Noted   Tinea cruris 07/24/2020   PCP NOTES >>>>>>>>>>>>>>>>>>>>>>>>>>>>>>> 01/12/2016   Folliculitis 05/09/2015   Hearing loss in right ear 08/05/2014   Panic attack as reaction to stress 04/20/2012   Dysphagia,  neurologic 01/07/2012   Morbid obesity (HCC) 01/07/2012   Chest pain, musculoskeletal 12/07/2011   Candidiasis of skin 08/12/2011   Annual physical exam 06/07/2011   OLECRANON BURSITIS 01/29/2011   PARTIAL ARTERIAL OCCLUSION OF RETINA 05/27/2010   Essential hypertension, benign 01/16/2010   ABNORMAL ELECTROCARDIOGRAM 01/14/2010   Asthma 10/14/2009   GERD 10/14/2009   Bipolar disorder (HCC) 02/20/2009   PHIMOSIS 02/20/2009   KNEE PAIN, CHRONIC 11/20/2008   HIP PAIN, RIGHT 08/19/2008   DM type 2 with diabetic peripheral neuropathy (HCC) 02/22/2007   Hyperlipidemia associated with type 2 diabetes mellitus (HCC) 02/22/2007   ALLERGIC RHINITIS, SEASONAL 02/22/2007   LOW BACK PAIN, CHRONIC 02/22/2007   MENIERE'S DISEASE, HX OF 02/22/2007    PCP: Amon Aloysius BRAVO, MD   REFERRING PROVIDER: Amon Aloysius BRAVO, MD  (Neurologist - Evonnie Stabs, MD)  REFERRING DIAG:  [phone] (ICD-10-CM) - Neuroleptic-induced parkinsonism (HCC)  R26.9 (ICD-10-CM) - Gait disorder  R29.6 (ICD-10-CM) - Multiple falls   THERAPY DIAG:  Muscle weakness (generalized)  Unsteadiness on feet  Other abnormalities of gait  and mobility  History of falling  RATIONALE FOR EVALUATION AND TREATMENT: Rehabilitation  ONSET DATE: ~2013 - 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 Pt feels that his mobility, gait and endurance are improving since starting PT.  Still more dependent on his platform 4WW than he would like, but would like to be able to use his cane more - does use cane to get in and out of restaurants but does not feel confident with this.  Pt accompanied by: self  PAIN: Are you having  pain? No  PERTINENT HISTORY:  Anxiety, asthma, bipolar affective disorder, COPD, depression, DM-II, DJD, chronic knee pain, B TKA, GERD, HTN, Mnire's disease, obesity, pancreatitis, h/o CVA, neurologic dysphagia, tardive dyskinesia, HOH - L deaf & R hearing aid  PRECAUTIONS: Fall  RED FLAGS: None  WEIGHT BEARING RESTRICTIONS: No  FALLS:  Has patient fallen in last 6 months? No and before the levodopa  supplements, falls were daily (sometimes several times a day)  LIVING ENVIRONMENT: Lives with: lives with their spouse and dog Lives in: Apartment Stairs: No Has following equipment at home: Single point cane, Environmental consultant - 2 wheeled, shower chair, and Grab bars (platform RW)  OCCUPATION: Retired  PLOF: Independent, Independent with community mobility with device, and Leisure: watching TV, listening to books, programming, walking ~15 min (around the block) - tries to do daily   PATIENT GOALS: To get back to the point where I can walk to the car unassisted (no AD) and use platform RW less in community.  Be able to assist my wife with bringing groceries in from the car.   OBJECTIVE: (objective measures completed at initial evaluation unless otherwise dated)  DIAGNOSTIC FINDINGS:  N/A - Multiple imaging studies in 2024 s/p falls but no acute fractures.  COGNITION: Overall cognitive status: Impaired and History of cognitive impairments - at baseline - mostly memory issues resulting from ECT 19 yrs ago, better over last 3 months but still notes limited STM   SENSATION: WFL  COORDINATION: B Heel/toe WFL Heel to shin unable bilaterally   POSTURE:  rounded shoulders, forward head, and flexed trunk   MUSCLE LENGTH: Hamstrings: mod/severe tight B Piriformis: mild /mod tight R>L Hip flexors: mod tight   LOWER EXTREMITY  ROM:    Grossly WFL except limited hip extension and rotation bilaterally  LOWER EXTREMITY MMT:    MMT Right eval Left eval  Hip flexion 3+ 3+  Hip extension  3+ 3+  Hip abduction 3- 3  Hip adduction 3+ 3+  Hip internal rotation 4- 3+  Hip external rotation 4- 3+  Knee flexion 4 4  Knee extension 4 4  Ankle dorsiflexion 4- 4-  Ankle plantarflexion    Ankle inversion    Ankle eversion    (Blank rows = not tested)  BED MOBILITY:  SBA  TRANSFERS: Assistive device utilized: None  Sit to stand: Modified independence and SBA Stand to sit: Modified independence and SBA Chair to chair: SBA Floor: NT  GAIT: Distance walked: clinic distances Assistive device utilized: Single point cane, platform RW/2WW, and None Level of assistance: SBA Gait pattern: step through pattern, decreased arm swing- Right, decreased arm swing- Left, decreased stride length, decreased hip/knee flexion- Right, decreased hip/knee flexion- Left, decreased ankle dorsiflexion- Right, decreased ankle dorsiflexion- Left, shuffling, festinating, trunk flexed, poor foot clearance- Right, and poor foot clearance- Left Comments: gait deviations more pronounced with decreasing AD support  FUNCTIONAL TESTS:  5 times sit to stand: unable w/o UE assist - only able to complete 4 reps in 34.03 sec; 20.18 sec with B UE assist Timed up and go (TUG): Normal - 15.84 sec w/o AD, Manual - 15.19 sec w/o AD, Cognitive - 15.66 sec w/o AD 10 meter walk test: 18.25 sec w/o AD, 16.56 sec with SPC, 14.78 sec with platform RW Gait speed: 1.80 ft/sec w/o AD, 1.98 ft/sec with SPC, 2.22 ft/sec with platform RW Berg balance scale: 41/56, 37-45 indicates significant (>80%) risk for falls (06/26/24) DGI: 12/24, <19/24 indicates high risk for falls (06/26/24)  PATIENT SURVEYS:  ABC scale: 620 / 1600 = 38.8 %, indicating a low level of physical functioning (<69% indicates risk for recurrent falls in PD)   TODAY'S TREATMENT:   08/16/2024  THERAPEUTIC EXERCISE: To improve strength and endurance.  Demonstration, verbal and tactile cues throughout for technique. NuStep - L5 x 6 min (LE only)  THERAPEUTIC  ACTIVITIES: To improve functional performance.  Demonstration, verbal and tactile cues throughout for technique. with upright/platform 4WW = 15.16 sec (normal speed), 13.84 sec (fastest comfortable speed) Gait speed with upright/platform 4WW = 2.16 ft/sec (normal speed), 2.37 ft/sec (fastest comfortable speed)  SELF CARE:  Provided education on community resources related to Parkinson's including support group and educational sessions, community-based PWR! Moves and exercise/movement groups as well as online resources related to PD.   NEUROMUSCULAR RE-EDUCATION: To improve balance, coordination, kinesthesia, posture, proprioception, reduce fall risk, amplitude of movement, speed of movement to reduce bradykinesia, and reduce rigidity.  Standing between backs of 2 chair for UE support with SBA of PT: PWR! Step forward x 10 bil - min A x 1 for posterior LOB PWR! Step backward x 10 bil PWR! Step through forward & backward x 10 bil  GAIT TRAINING: To normalize gait pattern and prepare to wean to LRAD. 40 ft with B hiking poles and CGA of PT via gait belt - cues for reciprocal sequencing with pt having difficulty achieving normal step pattern   08/13/24 Nustep L5x22min LE only ABC scale: 810 / 1600 = 50.6 % TUG = 21.01 sec with platform 4WW Completed BERG & DGI -- see below  Berg Balance Test   Sit to Stand Able to stand  independently using hands    Standing Unsupported  Able to stand safely 2 minutes    Sitting with Back Unsupported but Feet Supported on Floor or Stool Able to sit safely and securely 2 minutes    Stand to Sit Sits safely with minimal use of hands    Transfers Able to transfer safely, minor use of hands    Standing Unsupported with Eyes Closed Able to stand 10 seconds safely    Standing Unsupported with Feet Together Able to place feet together independently and stand 1 minute safely    From Standing, Reach Forward with Outstretched Arm Can reach confidently >25 cm (10)     From Standing Position, Pick up Object from Floor Able to pick up shoe safely and easily    From Standing Position, Turn to Look Behind Over each Shoulder Looks behind from both sides and weight shifts well    Turn 360 Degrees Able to turn 360 degrees safely one side only in 4 seconds or less    Standing Unsupported, Alternately Place Feet on Step/Stool Able to stand independently and complete 8 steps >20 seconds    Standing Unsupported, One Foot in Front Able to take small step independently and hold 30 seconds    Standing on One Leg Unable to try or needs assist to prevent fall    Total Score 47      Dynamic Gait Index   Level Surface Moderate Impairment    Change in Gait Speed Mild Impairment    Gait with Horizontal Head Turns Mild Impairment    Gait with Vertical Head Turns Mild Impairment    Gait and Pivot Turn Normal    Step Over Obstacle Mild Impairment    Step Around Obstacles Normal    Steps Moderate Impairment    Total Score 16       08/09/2024 THERAPEUTIC EXERCISE: To improve strength and endurance.   NuStep - L4 x 8 min (LE only) Gait around clinic with 270'  NEUROMUSCULAR RE-EDUCATION:  With UE support: Standing heel/toe lift x 10 B Standing hip abduction x 10 B Standing marching x 10 B Standing balance x 1 min; added EC 3x10 Sidesteps 3x down and back  PWR twist x 10    08/06/2024 THERAPEUTIC EXERCISE: To improve strength and endurance.   NuStep - L4 x 8 min (LE only)  THERAPEUTIC ACTIVITIES: To improve functional performance.  Demonstration, verbal and tactile cues throughout for technique. TUG = 19.88 sec with platform 4WW (pt misjudged seat and attempted to sit on armrest of chair), 23.06 sec w/o AD 5xSTS = 20.50 sec with B UE assist, unable w/o UE assist  NEUROMUSCULAR RE-EDUCATION: To improve balance, coordination, kinesthesia, posture, proprioception, reduce fall risk, amplitude of movement, speed of movement to reduce bradykinesia, and reduce  rigidity. Seated PWR! Moves: Up x 10 Rock x 10 Twist x 10 Step x 10  PWR! Sit to stand x 7 from Airex pad in chair Standing PWR! Moves (modified with chair in front for intermittent UE support as needed): Up x 10 Rock x 10 - one hand remaining on chair Twist x 10 - one hand remaining on chair Step x 8 - one hand remaining on chair   07/24/2024  THERAPEUTIC EXERCISE: To improve strength, endurance, ROM, and flexibility.  Demonstration, verbal and tactile cues throughout for technique.   - resistance reduced d/t c/o fatigue Gait 270 ft around clinic with walker   NEUROMUSCULAR RE-EDUCATION: To improve coordination, kinesthesia, posture, proprioception Seated:  PWR! Up 2x10  PWR Rock 2x10 each direction  PWR Twist 2x10 each direction  PWR step x 5 each direction very difficult  Sit to stand x 5   07/19/2024  THERAPEUTIC EXERCISE: To improve strength, endurance, ROM, and flexibility.  Demonstration, verbal and tactile cues throughout for technique.  NuStep - L3 x 8 min (LE only) - resistance reduced d/t c/o fatigue Seated marching 2lb 2x10 BLE Seated LAQ 2lb 2x10 BLE Seated hamstring stretch x 30 B Seated piriformis stretch fig 4 x 30 B Gait 90 ft around clinic with walker   NEUROMUSCULAR RE-EDUCATION: To improve coordination, kinesthesia, posture, proprioception Seated diagonals with RTB x 10 B Seated rows GTB 2x10 Seated shoulder ext GTB 2x10 Sit to stands x 5 Standing ant/post WS in staggered stance x 10 B with walker   07/12/2024  THERAPEUTIC EXERCISE: To improve strength, endurance, ROM, and flexibility.  Demonstration, verbal and tactile cues throughout for technique.  NuStep - L3 x 6 min (LE only) - resistance reduced d/t c/o fatigue Seated hip hinge HS stretch 3 x 30 bil Seated hip hinge figure-4 piriformis stretch 3 x 30 bil Seated hip flexor stretch in lunge position over edge of seat 3 x 30 bil  NEUROMUSCULAR RE-EDUCATION: To improve coordination,  kinesthesia, posture, proprioception, reduce fall risk, amplitude of movement, speed of movement to reduce bradykinesia, and reduce rigidity. Seated PWR! Moves: Up x 10 Rock x 5 bil Twist - deferred d/t increased shoulder pain Step x 5 bil Seated scapular retraction into pool noodle along spine and back of chair 10 x 3 Seated GTB scapular retraction + B shoulder row 10 x 3 Seated GTB scapular retraction + B shoulder extension 10 x 3    PATIENT EDUCATION:  Education details: HEP update - proximal LE stretches, transfer safety, gait safety with platform rollator, and Check for Safety - Home Fall Prevention Checklist for Older Adults   Person educated: Patient Education method: Explanation, Demonstration, Verbal cues, Handouts, and MedBridgeGO app access provided Education comprehension: verbalized understanding, returned demonstration, verbal cues required, and needs further education  HOME EXERCISE PROGRAM: Access Code: [email or web address] URL: [email or web address] Date: 07/12/2024 Prepared by: Elijah Hidden  Exercises - Seated Hip Abduction with Resistance  - 1 x daily - 3 x weekly - 3 sets - 10 reps - Seated Hip Adduction Isometrics with Ball  - 1 x daily - 3 x weekly - 3 sets - 10 reps - Seated March with Resistance  - 1 x daily - 3 x weekly - 3 sets - 10 reps - Sit to Stand  - 1 x daily - 3 x weekly - 1 sets - 5 reps - Seated Hamstring Stretch  - 1-2 x daily - 7 x weekly - 3 reps - 30 sec hold - Seated Figure 4 Piriformis Stretch  - 1-2 x daily - 7 x weekly - 3 reps - 30 sec hold - Seated Hip Flexor Stretch  - 1-2 x daily - 7 x weekly - 3 reps - 30 sec hold  Patient Education - Check for Safety  PWR Moves: - Sitting - Standing (modified)   ASSESSMENT:  CLINICAL IMPRESSION: Reis continues to report feeling that his mobility, gait and endurance are improving with PT, noting ability to walk for ~1 hr through the grocery store recently, but he is still hoping to be  able to wean to using his cane more often.  He continues to demonstrate improvements in gait safety and speed with his platform 4WW but is much more unsteady w/o AD.  Tried using B  hiking poles with gait in place of platform 4WW to continue to promote more upright posture while weaning to less restrictive AD, however pt having much difficulty coordinating reciprocal step pattern with hiking poles.  Darin will benefit from continued skilled PT to address ongoing motor control, strength and balance deficits to improve mobility and activity tolerance with decreased pain interference and decreased risk for falls.   OBJECTIVE IMPAIRMENTS: Abnormal gait, decreased activity tolerance, decreased balance, decreased coordination, decreased endurance, decreased knowledge of condition, decreased knowledge of use of DME, decreased mobility, difficulty walking, decreased strength, decreased safety awareness, dizziness, impaired perceived functional ability, impaired flexibility, improper body mechanics, postural dysfunction, and pain.   ACTIVITY LIMITATIONS: carrying, lifting, bending, standing, squatting, stairs, transfers, bed mobility, reach over head, locomotion level, and caring for others  PARTICIPATION LIMITATIONS: meal prep, cleaning, laundry, shopping, and community activity  PERSONAL FACTORS: Age, Fitness, Past/current experiences, Time since onset of injury/illness/exacerbation, and 3+ comorbidities: Anxiety, asthma, bipolar affective disorder, COPD, depression, DM-II, DJD, chronic knee pain, B TKA, GERD, HTN, Mnire's disease, obesity, pancreatitis, h/o CVA, neurologic dysphagia, tardive dyskinesia, HOH - L deaf & R hearing aid are also affecting patient's functional outcome.   REHAB POTENTIAL: Good  CLINICAL DECISION MAKING: Unstable/unpredictable  EVALUATION COMPLEXITY: High   GOALS: Goals reviewed with patient? Yes  SHORT TERM GOALS: Target date: 07/30/2024  Patient will be independent with  initial HEP. Baseline: To be established 07/05/24 - consistent with current HEP  07/12/24 - clarified HEP frequency Goal status: MET - 08/06/24   2.  Patient will demonstrate decreased fall risk by scoring < 13.5 sec on normal TUG. Baseline: 15.84 sec w/o AD 08/06/24 - 19.88 sec with platform 4WW (pt misjudged seat and attempted to sit on armrest of chair), 23.06 sec w/o AD Goal status: IN PROGRESS - 08/13/24 - 21.01 sec with platform 4WW  3.  Patient will be educated on strategies to decrease risk of falls.  Baseline:  Goal status: MET - 07/09/24   LONG TERM GOALS: Target date: 09/10/2024  Patient will be independent with ongoing/advanced HEP for self-management at home incorporating PWR! Moves as indicated .  Baseline:  Goal status: IN PROGRESS - 08/13/24 - met for current HEP   2.  Patient will be able to ambulate 600' with LRAD with good safety to access community.  Baseline:  Goal status: IN PROGRESS - 08/09/24 - 270' with standing $WW  3.  Patient will be able to step up/down curb safely with LRAD for safety with community ambulation.  Baseline:  Goal status: IN PROGRESS - 08/16/24 - pt reports he does not attempt curbs with platform 4WW as walker too heavy to navigate up curb  4.  Patient will demonstrate gait speed of >/= 2.62 ft/sec (0.8 m/s) to be a safe limited community ambulator with decreased risk for recurrent falls.  Baseline: 1.80 ft/sec w/o AD, 1.98 ft/sec with SPC and 2.22 ft/sec with platform RW 07/19/24 - 2.26 ft/sec Goal status: IN PROGRESS - 08/16/24 - 2.16 ft/sec (normal speed), 2.37 ft/sec (fastest comfortable speed) with upright/platform 4WW  5.  Patient will improve 5x STS time to </= 15 seconds w/o need for UE assist to demonstrate improved functional strength and transfer efficiency. Baseline: unable w/o UE assist - only able to complete 4 reps in 34.03 sec; 20.18 sec with B UE assist Goal status: IN PROGRESS - 08/06/24 - 20.50 sec with B UE assist  6.  Patient  will demonstrate at least 19/24 on DGI to improve  gait stability and reduce risk for falls. Baseline: 12/24 (06/26/24) Goal status: IN PROGRESS - 08/13/24 - 16/24  7.  Patient will improve Berg score by at least 8 points to improve safety and stability with ADLs in standing and reduce risk for falls. (MCID = 8 points)   Baseline: 41/56 (06/26/24) Goal status: IN PROGRESS - 08/13/24 - 47/56  8.  Patient will report >/= 52% on ABC scale to demonstrate improved balance confidence and decreased risk for falls. Baseline: 620 / 1600 = 38.8 % Goal status: IN PROGRESS - 08/13/24 - 810 / 1600 = 50.6 %  9. Patient will verbalize understanding of local Parkinson's disease community resources, including community fitness post d/c. Baseline:  Goal status: IN PROGRESS - 08/16/24 - handouts provided   PLAN:  PT FREQUENCY: 2x/week  PT DURATION: 12 weeks  PLANNED INTERVENTIONS: 97164- PT Re-evaluation, 97750- Physical Performance Testing, 97110-Therapeutic exercises, 97530- Therapeutic activity, [id number]- Neuromuscular re-education, 97535- Self Care, 02859- Manual therapy, [id number]- Gait training, [phone]- Canalith repositioning, [id number]- Electrical stimulation (unattended), 97035- Ultrasound, 02966- Ionotophoresis 4mg /ml Dexamethasone , 79439 (1-2 muscles), 20561 (3+ muscles)- Dry Needling, Patient/Family education, Balance training, Stair training, Taping, Joint mobilization, Vestibular training, DME instructions, Cryotherapy, and Moist heat  PLAN FOR NEXT SESSION: need to assess curb negotiation; resubmit for more visits; continue LE strengthening; balance activities (both static and dynamic); PWR! Moves    [place], [place]  08/16/2024, 1:41 PM       Date of referral: 05/31/24 Referring provider: Amon Aloysius BRAVO, MD Referring diagnosis?  G21.11,T43.505A (ICD-10-CM) - Neuroleptic-induced parkinsonism (HCC)  R26.9 (ICD-10-CM) - Gait disorder  R29.6 (ICD-10-CM) - Multiple falls   Treatment diagnosis? (if  different than referring diagnosis)  Muscle weakness (generalized)  Unsteadiness on feet  Other abnormalities of gait and mobility  History of falling  What was this (referring dx) caused by? Ongoing Issue and Other: neurodegenerative disease  Nature of Condition: Chronic (continuous duration > 3 months)   Laterality: Both  Current Functional Measure Score: Other ABC Scale: 810 / 1600 = 50.6 %, indicating a moderate level of physical functioning (<69% indicates risk for recurrent falls in PD)    Objective measurements identify impairments when they are compared to normal values, the uninvolved extremity, and prior level of function.  [x]  Yes  []  No  Objective assessment of functional ability: Moderate functional limitations   Briefly describe symptoms:  Tomie is progressing with PT demonstrating improving tolerance for standing activities, improving gait safety and speed with his platform 4WW, and decreasing fall risk per improvements in Berg from 41/56 to 47/56 (indicating a reduction in fall risk from significant (greater than 80%) to moderate moderate (>50%) and DGI from 12/24 to 16/24 (scores of <19/24 still indicating high risk for falls) today.  He has not had any falls in the past month.  [place] will benefit from continued skilled PT to address ongoing motor control, strength and balance deficits to improve mobility and activity tolerance with decreased pain interference and decreased risk for falls.   How did symptoms start: gradual onset  Average pain intensity:  Last 24 hours: 2/10  Past week: 2/10  How often does the pt experience symptoms? Frequently  How much have the symptoms interfered with usual daily activities? Moderately  How has condition changed since care began at this facility? A little better   In general, how is the patients overall health? Fair  Onset date: Parkinsonism diagnosis in 2013, worsening weakness over the past 6 months  BACK PAIN (STarT  Back Screening Tool) - (When applicable):   Has your back pain spread down your leg(s) at sometime in the last 2 weeks? []  Yes   [x]  No Have you had pain in the shoulder or neck at sometime in the past 2 weeks? [x]  Yes   []  No Have you only walked short distances because of your back pain? []  Yes   [x]  No In the past 2 weeks, have you dressed more slowly than usual because of your back pain? []  Yes   [x]  No Do you think it is not really safe for person with a condition like yours to be physically active? []  Yes   [x]  No Have worrying thoughts been going through your mind a lot of the time? [x]  Yes   []  No Do you feel that your back pain is terrible and it is never going to get any better? []  Yes   [x]  No In general, have you stopped enjoying all the things you usually enjoy? [x]  Yes   []  No Overall, how bothersome has your back pain been in the last 2 weeks? []  Not at all   [x]  Slightly     []  Moderate   []  Very much     []  Extremely

## 2024-08-21 ENCOUNTER — Ambulatory Visit
Admission: RE | Admit: 2024-08-21 | Discharge: 2024-08-21 | Disposition: A | Discharge status: Home / Self Care | Source: Ambulatory Visit | Attending: Family Medicine | Admitting: Family Medicine

## 2024-08-21 VITALS — BP 148/85 | HR 84 | Temp 97.4°F | Resp 18

## 2024-08-21 DIAGNOSIS — N471 Phimosis: Secondary | ICD-10-CM

## 2024-08-21 MED ORDER — FLUCONAZOLE 150 MG PO TABS: 150.0000 mg | ORAL_TABLET | Freq: Once | ORAL | 0 refills | Status: AC

## 2024-08-21 MED ORDER — TRIAMCINOLONE ACETONIDE 0.1 % EX CREA: 1.0000 | TOPICAL_CREAM | Freq: Two times a day (BID) | CUTANEOUS | 0 refills | Status: AC

## 2024-08-21 MED ORDER — NYSTATIN 100000 UNIT/GM EX CREA: TOPICAL_CREAM | CUTANEOUS | 0 refills | Status: AC

## 2024-08-21 NOTE — ED Triage Notes (Signed)
 Pt reports he has phimosis, the skin in the penis, is red, white and painful x 1 month.  Clotrimazole -Betamethasone  1-0.05  (leftover) was makes it worse. Pt was not able to  get an appointment with my PCP or Urologist.

## 2024-08-21 NOTE — Discharge Instructions (Addendum)
 I am changing your cream to nystatin  and triamcinolone .  Please use the antifungal nystatin  cream until the symptoms resolve or your urologist changes your treatment.  Use triamcinolone  steroid cream for a max of 1 week.  I am also doing a loading dose of oral fluconazole , so take 1 dose and continue on with the topical treatments.  Please make sure you follow-up with your urologist as soon as possible.

## 2024-08-21 NOTE — ED Provider Notes (Signed)
 [facility]  Note:  This document was prepared using Conservation officer, historic buildings and may include unintentional dictation errors.  MRN: [id number] DOB: [birth year]  Subjective:   Ryan Zhang is a 78 y.o. male presenting for 1 month history of persistent phimosis.  Feels that there is redness, slight white discharge.  Has been using clotrimazole  betamethasone  cream.  Has not been able to get an appointment with his urologist.  Patient is still able to urinate.  He does have a history of phimosis.  No current facility-administered medications for this encounter.  Current Outpatient Medications:    albuterol  (PROVENTIL ) (2.5 MG/3ML) 0.083% nebulizer solution, TAKE (2.5MG  TOTAL) BY NEBULIZATION EVERY 6 HOURS AS NEEDED FOR WHEEZING OR SHORTNESS OF BREATH, Disp: 150 mL, Rfl: 0   albuterol  (VENTOLIN  HFA) 108 (90 Base) MCG/ACT inhaler, Inhale 2 puffs into the lungs every 6 (six) hours as needed for wheezing or shortness of breath., Disp: 18 g, Rfl: 6   asenapine  (SAPHRIS ) 5 MG SUBL 24 hr tablet, Place 5 mg under the tongue at bedtime., Disp: , Rfl:    Blood Glucose Monitoring Suppl (ONETOUCH VERIO REFLECT) w/Device KIT, Check blood sugars 1-2 times daily, Disp: 1 kit, Rfl: 0   budesonide -formoterol (SYMBICORT) 160-4.5 MCG/ACT inhaler, Inhale 2 puffs into the lungs 2 (two) times daily. , Disp: , Rfl:    buPROPion (WELLBUTRIN XL) 150 MG 24 hr tablet, Take 150 mg by mouth daily. (Patient taking differently: Take 300 mg by mouth daily.), Disp: , Rfl:    carbidopa -levodopa  (SINEMET  IR) 25-100 MG tablet, Take 1 tablet by mouth 3 (three) times daily. 8am/noon/4pm, Disp: 270 tablet, Rfl: 2   cholecalciferol (VITAMIN D3) 25 MCG (1000 UNIT) tablet, Take 1,000 Units by mouth daily., Disp: , Rfl:    clopidogrel  (PLAVIX ) 75 MG tablet, Take 1 tablet (75 mg total) by mouth daily., Disp: 90 tablet, Rfl: 1   DULoxetine  (CYMBALTA ) 60 MG capsule, Take 60 mg by mouth every morning.,  Disp: , Rfl:    fexofenadine (ALLEGRA) 180 MG tablet, Take 180 mg by mouth every morning. 8am Marlo Smock), Disp: , Rfl:    fluticasone  (FLONASE ) 50 MCG/ACT nasal spray, , Disp: , Rfl:    lamoTRIgine (LAMICTAL) 25 MG tablet, Take 50 mg by mouth at bedtime., Disp: , Rfl:    Lancets (ONETOUCH ULTRASOFT) lancets, Check blood sugars no more than twice daily, Disp: 100 each, Rfl: 12   losartan  (COZAAR ) 25 MG tablet, Take 1 tablet (25 mg total) by mouth every evening., Disp: 90 tablet, Rfl: 1   montelukast (SINGULAIR) 10 MG tablet, Take 10 mg by mouth at bedtime. Fleeta Smock, Disp: , Rfl:    Multiple Vitamin (MULTIVITAMIN) tablet, Take 1 tablet by mouth daily. Spectravite senior, Disp: , Rfl:    naproxen  (NAPROSYN ) 500 MG tablet, Take 500 mg by mouth daily as needed., Disp: , Rfl:    nystatin  (MYCOSTATIN /NYSTOP ) powder, Apply 1 Application topically 3 (three) times daily., Disp: , Rfl:    ONETOUCH ULTRA test strip, USE TO TEST GLUCOSE LEVELS 2 TIMES DAILY, Disp: 100 strip, Rfl: 0   pantoprazole  (PROTONIX ) 40 MG tablet, Take 1 tablet (40 mg total) by mouth daily before breakfast., Disp: 90 tablet, Rfl: 1   pioglitazone -metformin  (ACTOPLUS MET ) 15-850 MG tablet, TAKE ONE TABLET BY MOUTH TWICE DAILY WITH A MEAL, Disp: 180 tablet, Rfl: 0   simvastatin  (ZOCOR ) 40 MG tablet, Take 1 tablet (40 mg total) by mouth at bedtime., Disp: 90 tablet, Rfl:  1   solifenacin  (VESICARE ) 10 MG tablet, Take 1 tablet (10 mg total) by mouth daily., Disp: 90 tablet, Rfl: 3   Allergies  Allergen Reactions   Celexa  [Citalopram Hydrobromide] Other (See Comments)   Depakote  [Divalproex Sodium] Other (See Comments)   Methocarbamol     Other reaction(s): Hallucinations   Paroxetine Hcl     Other reaction(s): Insomnia   Smz-Tmp Ds [Sulfamethoxazole-Trimethoprim] Nausea And Vomiting   Other     Symbrax alteration in blood sugar, pt unsure if low or high blood sugar   Oxycodone-Acetaminophen      Other reaction(s): Unknown    Risperidone Other (See Comments)    REACTION: hyper    Past Medical History:  Diagnosis Date   Allergy    allergy shots Dr. Cloretta   Anemia    Anxiety    Asthma    moderate persistant   Bipolar affective (HCC)    Cataract    both eyes   Clotting disorder    Due to blood thinner   COPD (chronic obstructive pulmonary disease) (HCC)    Depression    Diabetes mellitus    DJD (degenerative joint disease)    Dysphagia    Gallstones    hx of, s/p cholecystectomy   GERD (gastroesophageal reflux disease)    Hepatitis A    as teenager 60's   Hollenhorst plaque    right eye   Hyperlipidemia    Hypertension    Kidney stones    hx of   Meniere's disease    Motility disorder, esophageal    Obesity    Pancreatitis    Personal history of colonic polyps 11/2004   hyperplastic.   Stroke [facility])    per MRI     Past Surgical History:  Procedure Laterality Date   CATARACT EXTRACTION Bilateral 09/2015 and 10/2015   CHOLECYSTECTOMY     COLONOSCOPY     EYE SURGERY     JOINT REPLACEMENT     TOTAL KNEE ARTHROPLASTY Bilateral    both knees    Family History  Problem Relation Age of Onset   Arthritis Mother    Cancer Mother    Diabetes Father    Alcohol abuse Father    Cancer Sister        unknown type   Heart disease Brother    Obesity Brother    Obesity Brother    Heart disease Maternal Grandmother    Hearing loss Maternal Grandmother    Alzheimer's disease Paternal Grandmother    Drug abuse Daughter    Colon cancer Neg Hx    Prostate cancer Neg Hx    Colon polyps Neg Hx    Esophageal cancer Neg Hx    Rectal cancer Neg Hx    Stomach cancer Neg Hx     Social History   Tobacco Use   Smoking status: Former    Current packs/day: 0.00    Average packs/day: 1 pack/day for 1 year (1.0 ttl pk-yrs)    Types: Cigarettes    Start date: 11/16/1963    Quit date: 11/15/1964    Years since quitting: 59.8   Smokeless tobacco: Never   Tobacco comments:    Questions not useful   Vaping Use   Vaping status: Never Used  Substance Use Topics   Alcohol use: Not Currently   Drug use: No    ROS   Objective:   Vitals: BP (!) 148/85 (BP Location: Right Arm)   Pulse 84  Temp (!) 97.4 F (36.3 C) (Oral)   Resp 18   SpO2 94%   Physical Exam Constitutional:      General: He is not in acute distress.    Appearance: Normal appearance. He is well-developed and normal weight. He is not ill-appearing, toxic-appearing or diaphoretic.  HENT:     Head: Normocephalic and atraumatic.     Right Ear: External ear normal.     Left Ear: External ear normal.     Nose: Nose normal.     Mouth/Throat:     Pharynx: Oropharynx is clear.  Eyes:     General: No scleral icterus.       Right eye: No discharge.        Left eye: No discharge.     Extraocular Movements: Extraocular movements intact.  Cardiovascular:     Rate and Rhythm: Normal rate.  Pulmonary:     Effort: Pulmonary effort is normal.  Genitourinary:    Penis: Phimosis, erythema and discharge present.   Musculoskeletal:     Cervical back: Normal range of motion.  Neurological:     Mental Status: He is alert and oriented to person, place, and time.  Psychiatric:        Mood and Affect: Mood normal.        Behavior: Behavior normal.        Thought Content: Thought content normal.        Judgment: Judgment normal.     Assessment and Plan :   PDMP not reviewed this encounter.  1. Phimosis    Commend loading dose of fluconazole  followed by treatment with nystatin  and triamcinolone  cream.  Recommend attempting careful and gentle retracting of the foreskin.  Emphasized need for close follow-up with his urologist.  Counseled patient on potential for adverse effects with medications prescribed/recommended today, ER and return-to-clinic precautions discussed, patient verbalized understanding.    Christopher Savannah, PA-C 08/21/24 1800

## 2024-08-22 DIAGNOSIS — J3089 Other allergic rhinitis: Secondary | ICD-10-CM | POA: Diagnosis not present

## 2024-08-22 DIAGNOSIS — J301 Allergic rhinitis due to pollen: Secondary | ICD-10-CM | POA: Diagnosis not present

## 2024-08-27 DIAGNOSIS — J301 Allergic rhinitis due to pollen: Secondary | ICD-10-CM | POA: Diagnosis not present

## 2024-08-27 DIAGNOSIS — J3089 Other allergic rhinitis: Secondary | ICD-10-CM | POA: Diagnosis not present

## 2024-08-29 ENCOUNTER — Ambulatory Visit
Admission: RE | Admit: 2024-08-29 | Discharge: 2024-08-29 | Disposition: A | Discharge status: Home / Self Care | Source: Ambulatory Visit | Attending: Family Medicine | Admitting: Family Medicine

## 2024-08-29 VITALS — BP 126/81 | HR 83 | Temp 97.7°F | Resp 15

## 2024-08-29 DIAGNOSIS — N471 Phimosis: Secondary | ICD-10-CM | POA: Diagnosis not present

## 2024-08-29 MED ORDER — FLUCONAZOLE 150 MG PO TABS: ORAL_TABLET | ORAL | 0 refills | Status: AC

## 2024-08-29 NOTE — ED Triage Notes (Signed)
 Pt present with c/o groin pain and soreness. Pt states he still has sensitivity at the groin. Pt reports nausea and sore throat x yesterday.   Pt states he has taken abx for yeast infection and has not felt relief.

## 2024-08-29 NOTE — ED Provider Notes (Signed)
 [facility]  Note:  This document was prepared using Conservation officer, historic buildings and may include unintentional dictation errors.  MRN: [id number] DOB: 1946/09/07  Subjective:   Ryan Zhang is a 77 y.o. male presenting for recheck on persistent yeast infection from his chronic phimosis.  Patient just scheduled a urology appointment but is not for 3 weeks.  Prior to this patient has used nystatin  powder but it stopped working.  He was subsequently tried on clotrimazole /betamethasone  but that did not help either.  At his last visit on 08/21/2024 was switched to nystatin  cream with triamcinolone .  Unfortunately symptoms persist and he still has surrounding groin pain.  Has felt general malaise, fatigue, nausea without vomiting.  Yesterday, started to have throat pain.  No fever, cough, chest pain, shortness of breath, wheezing.  No current facility-administered medications for this encounter.  Current Outpatient Medications:    albuterol  (PROVENTIL ) (2.5 MG/3ML) 0.083% nebulizer solution, TAKE (2.5MG  TOTAL) BY NEBULIZATION EVERY 6 HOURS AS NEEDED FOR WHEEZING OR SHORTNESS OF BREATH, Disp: 150 mL, Rfl: 0   albuterol  (VENTOLIN  HFA) 108 (90 Base) MCG/ACT inhaler, Inhale 2 puffs into the lungs every 6 (six) hours as needed for wheezing or shortness of breath., Disp: 18 g, Rfl: 6   asenapine  (SAPHRIS ) 5 MG SUBL 24 hr tablet, Place 5 mg under the tongue at bedtime., Disp: , Rfl:    Blood Glucose Monitoring Suppl (ONETOUCH VERIO REFLECT) w/Device KIT, Check blood sugars 1-2 times daily, Disp: 1 kit, Rfl: 0   budesonide -formoterol (SYMBICORT) 160-4.5 MCG/ACT inhaler, Inhale 2 puffs into the lungs 2 (two) times daily. , Disp: , Rfl:    buPROPion (WELLBUTRIN XL) 150 MG 24 hr tablet, Take 150 mg by mouth daily. (Patient taking differently: Take 300 mg by mouth daily.), Disp: , Rfl:    carbidopa -levodopa  (SINEMET  IR) 25-100 MG tablet, Take 1 tablet by mouth 3 (three) times  daily. 8am/noon/4pm, Disp: 270 tablet, Rfl: 2   cholecalciferol (VITAMIN D3) 25 MCG (1000 UNIT) tablet, Take 1,000 Units by mouth daily., Disp: , Rfl:    clopidogrel  (PLAVIX ) 75 MG tablet, Take 1 tablet (75 mg total) by mouth daily., Disp: 90 tablet, Rfl: 1   DULoxetine  (CYMBALTA ) 60 MG capsule, Take 60 mg by mouth every morning., Disp: , Rfl:    fexofenadine (ALLEGRA) 180 MG tablet, Take 180 mg by mouth every morning. 8am Marlo Smock), Disp: , Rfl:    fluticasone  (FLONASE ) 50 MCG/ACT nasal spray, , Disp: , Rfl:    lamoTRIgine (LAMICTAL) 25 MG tablet, Take 50 mg by mouth at bedtime., Disp: , Rfl:    Lancets (ONETOUCH ULTRASOFT) lancets, Check blood sugars no more than twice daily, Disp: 100 each, Rfl: 12   losartan  (COZAAR ) 25 MG tablet, Take 1 tablet (25 mg total) by mouth every evening., Disp: 90 tablet, Rfl: 1   montelukast (SINGULAIR) 10 MG tablet, Take 10 mg by mouth at bedtime. Fleeta Smock, Disp: , Rfl:    Multiple Vitamin (MULTIVITAMIN) tablet, Take 1 tablet by mouth daily. Spectravite senior, Disp: , Rfl:    naproxen  (NAPROSYN ) 500 MG tablet, Take 500 mg by mouth daily as needed., Disp: , Rfl:    nystatin  cream (MYCOSTATIN ), Apply to affected area 2 times daily., Disp: 30 g, Rfl: 0   ONETOUCH ULTRA test strip, USE TO TEST GLUCOSE LEVELS 2 TIMES DAILY, Disp: 100 strip, Rfl: 0   pantoprazole  (PROTONIX ) 40 MG tablet, Take 1 tablet (40 mg total) by mouth daily before  breakfast., Disp: 90 tablet, Rfl: 1   pioglitazone -metformin  (ACTOPLUS MET ) 15-850 MG tablet, TAKE ONE TABLET BY MOUTH TWICE DAILY WITH A MEAL, Disp: 180 tablet, Rfl: 0   simvastatin  (ZOCOR ) 40 MG tablet, Take 1 tablet (40 mg total) by mouth at bedtime., Disp: 90 tablet, Rfl: 1   solifenacin  (VESICARE ) 10 MG tablet, Take 1 tablet (10 mg total) by mouth daily., Disp: 90 tablet, Rfl: 3   triamcinolone  cream (KENALOG ) 0.1 %, Apply 1 Application topically 2 (two) times daily., Disp: 30 g, Rfl: 0   Allergies  Allergen Reactions    Celexa  [Citalopram Hydrobromide] Other (See Comments)   Depakote  [Divalproex Sodium] Other (See Comments)   Methocarbamol     Other reaction(s): Hallucinations   Paroxetine Hcl     Other reaction(s): Insomnia   Smz-Tmp Ds [Sulfamethoxazole-Trimethoprim] Nausea And Vomiting   Other     Symbrax alteration in blood sugar, pt unsure if low or high blood sugar   Oxycodone-Acetaminophen      Other reaction(s): Unknown   Risperidone Other (See Comments)    REACTION: hyper    Past Medical History:  Diagnosis Date   Allergy    allergy shots Dr. Cloretta   Anemia    Anxiety    Asthma    moderate persistant   Bipolar affective (HCC)    Cataract    both eyes   Clotting disorder    Due to blood thinner   COPD (chronic obstructive pulmonary disease) (HCC)    Depression    Diabetes mellitus    DJD (degenerative joint disease)    Dysphagia    Gallstones    hx of, s/p cholecystectomy   GERD (gastroesophageal reflux disease)    Hepatitis A    as teenager 60's   Hollenhorst plaque    right eye   Hyperlipidemia    Hypertension    Kidney stones    hx of   Meniere's disease    Motility disorder, esophageal    Obesity    Pancreatitis    Personal history of colonic polyps 11/2004   hyperplastic.   Stroke [facility])    per MRI     Past Surgical History:  Procedure Laterality Date   CATARACT EXTRACTION Bilateral 09/2015 and 10/2015   CHOLECYSTECTOMY     COLONOSCOPY     EYE SURGERY     JOINT REPLACEMENT     TOTAL KNEE ARTHROPLASTY Bilateral    both knees    Family History  Problem Relation Age of Onset   Arthritis Mother    Cancer Mother    Diabetes Father    Alcohol abuse Father    Cancer Sister        unknown type   Heart disease Brother    Obesity Brother    Obesity Brother    Heart disease Maternal Grandmother    Hearing loss Maternal Grandmother    Alzheimer's disease Paternal Grandmother    Drug abuse Daughter    Colon cancer Neg Hx    Prostate cancer Neg Hx     Colon polyps Neg Hx    Esophageal cancer Neg Hx    Rectal cancer Neg Hx    Stomach cancer Neg Hx     Social History   Tobacco Use   Smoking status: Former    Current packs/day: 0.00    Average packs/day: 1 pack/day for 1 year (1.0 ttl pk-yrs)    Types: Cigarettes    Start date: 11/16/1963    Quit date:  11/15/1964    Years since quitting: 59.8   Smokeless tobacco: Never   Tobacco comments:    Questions not useful  Vaping Use   Vaping status: Never Used  Substance Use Topics   Alcohol use: Not Currently   Drug use: No    ROS   Objective:   Vitals: BP 126/81 (BP Location: Left Arm)   Pulse 83   Temp 97.7 F (36.5 C) (Oral)   Resp 15   SpO2 93%   Physical Exam Constitutional:      General: He is not in acute distress.    Appearance: Normal appearance. He is well-developed and normal weight. He is not ill-appearing, toxic-appearing or diaphoretic.  HENT:     Head: Normocephalic and atraumatic.     Right Ear: External ear normal.     Left Ear: External ear normal.     Nose: Nose normal.     Mouth/Throat:     Mouth: Mucous membranes are moist.     Pharynx: No pharyngeal swelling, oropharyngeal exudate, posterior oropharyngeal erythema or uvula swelling.     Tonsils: No tonsillar exudate or tonsillar abscesses. 0 on the right. 0 on the left.  Eyes:     General: No scleral icterus.       Right eye: No discharge.        Left eye: No discharge.     Extraocular Movements: Extraocular movements intact.  Cardiovascular:     Rate and Rhythm: Normal rate and regular rhythm.     Heart sounds: Normal heart sounds. No murmur heard.    No friction rub. No gallop.  Pulmonary:     Effort: Pulmonary effort is normal. No respiratory distress.     Breath sounds: Normal breath sounds. No stridor. No wheezing, rhonchi or rales.  Genitourinary:    Penis: Phimosis, erythema and tenderness present.   Musculoskeletal:     Cervical back: Normal range of motion.  Neurological:      Mental Status: He is alert and oriented to person, place, and time.  Psychiatric:        Mood and Affect: Mood normal.        Behavior: Behavior normal.        Thought Content: Thought content normal.        Judgment: Judgment normal.     Assessment and Plan :   PDMP not reviewed this encounter.  1. Phimosis      Creatinine clearance calculated at 61mL/min -127mL/min using creatinine level from 05/14/2024.  Patient had very slight improvement in his condition and therefore recommended a 2-week trial of oral fluconazole .  Can still use triamcinolone  cream as needed.  Otherwise has a clear cardiopulmonary exam, clear throat exam.  Recommend supportive care. Counseled patient on potential for adverse effects with medications prescribed/recommended today, ER and return-to-clinic precautions discussed, patient verbalized understanding.    Christopher Savannah, PA-C 08/29/24 1527

## 2024-08-29 NOTE — Discharge Instructions (Signed)
 Start fluconazole  for the next 2 weeks to try a different method of helping with your phimosis. Hold simvastatin , the cholesterol medication, while on fluconazole . Keep your follow up appointment with the urologist.

## 2024-09-05 DIAGNOSIS — J3081 Allergic rhinitis due to animal (cat) (dog) hair and dander: Secondary | ICD-10-CM | POA: Diagnosis not present

## 2024-09-05 DIAGNOSIS — J301 Allergic rhinitis due to pollen: Secondary | ICD-10-CM | POA: Diagnosis not present

## 2024-09-05 DIAGNOSIS — J3089 Other allergic rhinitis: Secondary | ICD-10-CM | POA: Diagnosis not present

## 2024-09-06 ENCOUNTER — Ambulatory Visit: Admitting: Physical Therapy

## 2024-09-06 ENCOUNTER — Encounter: Payer: Self-pay | Admitting: Physical Therapy

## 2024-09-06 DIAGNOSIS — R2681 Unsteadiness on feet: Secondary | ICD-10-CM

## 2024-09-06 DIAGNOSIS — R2689 Other abnormalities of gait and mobility: Secondary | ICD-10-CM

## 2024-09-06 DIAGNOSIS — M6281 Muscle weakness (generalized): Secondary | ICD-10-CM | POA: Diagnosis not present

## 2024-09-06 DIAGNOSIS — Z9181 History of falling: Secondary | ICD-10-CM

## 2024-09-06 NOTE — Therapy (Signed)
 OUTPATIENT PHYSICAL THERAPY TREATMENT / RECERTIFICATION   Progress Note  Reporting Period 08/13/2024 to 09/06/2024   See note below for Objective Data and Assessment of Progress/Goals.     Patient Name: Ryan Zhang MRN: [id number] DOB:Dec 21, 1945, 78 y.o., male Today's Date: 09/06/2024   END OF SESSION:  PT End of Session - 09/06/24 1104     Visit Number 12    Date for Recertification  11/01/24    Authorization Type [employer]    Authorization Time Period auth pending    Authorization - Visit Number 1    Authorization - Number of Visits --    Progress Note Due on Visit 22   Recert/PN on visit #12 (09/06/24)   PT Start Time 1104    PT Stop Time 1147    PT Time Calculation (min) 43 min    Activity Tolerance Patient limited by fatigue;Patient limited by pain    Behavior During Therapy WFL for tasks assessed/performed             Past Medical History:  Diagnosis Date   Allergy    allergy shots Dr. Cloretta   Anemia    Anxiety    Asthma    moderate persistant   Bipolar affective (HCC)    Cataract    both eyes   Clotting disorder    Due to blood thinner   COPD (chronic obstructive pulmonary disease) (HCC)    Depression    Diabetes mellitus    DJD (degenerative joint disease)    Dysphagia    Gallstones    hx of, s/p cholecystectomy   GERD (gastroesophageal reflux disease)    Hepatitis A    as teenager 60's   Hollenhorst plaque    right eye   Hyperlipidemia    Hypertension    Kidney stones    hx of   Meniere's disease    Motility disorder, esophageal    Obesity    Pancreatitis    Personal history of colonic polyps 11/2004   hyperplastic.   Stroke [facility])    per MRI   Past Surgical History:  Procedure Laterality Date   CATARACT EXTRACTION Bilateral 09/2015 and 10/2015   CHOLECYSTECTOMY     COLONOSCOPY     EYE SURGERY     JOINT REPLACEMENT     TOTAL KNEE ARTHROPLASTY Bilateral    both knees   Patient Active Problem List   Diagnosis Date  Noted   Tinea cruris 07/24/2020   PCP NOTES >>>>>>>>>>>>>>>>>>>>>>>>>>>>>>> 01/12/2016   Folliculitis 05/09/2015   Hearing loss in right ear 08/05/2014   Panic attack as reaction to stress 04/20/2012   Dysphagia, neurologic 01/07/2012   Morbid obesity (HCC) 01/07/2012   Chest pain, musculoskeletal 12/07/2011   Candidiasis of skin 08/12/2011   Annual physical exam 06/07/2011   OLECRANON BURSITIS 01/29/2011   PARTIAL ARTERIAL OCCLUSION OF RETINA 05/27/2010   Essential hypertension, benign 01/16/2010   ABNORMAL ELECTROCARDIOGRAM 01/14/2010   Asthma 10/14/2009   GERD 10/14/2009   Bipolar disorder (HCC) 02/20/2009   PHIMOSIS 02/20/2009   KNEE PAIN, CHRONIC 11/20/2008   HIP PAIN, RIGHT 08/19/2008   DM type 2 with diabetic peripheral neuropathy (HCC) 02/22/2007   Hyperlipidemia associated with type 2 diabetes mellitus (HCC) 02/22/2007   ALLERGIC RHINITIS, SEASONAL 02/22/2007   LOW BACK PAIN, CHRONIC 02/22/2007   MENIERE'S DISEASE, HX OF 02/22/2007    PCP: Amon Aloysius BRAVO, MD   REFERRING PROVIDER: Amon Aloysius BRAVO, MD  (Neurologist - Evonnie Stabs, MD)  REFERRING DIAG:  G21.11,T43.505A (ICD-10-CM) - Neuroleptic-induced parkinsonism (HCC)  R26.9 (ICD-10-CM) - Gait disorder  R29.6 (ICD-10-CM) - Multiple falls   THERAPY DIAG:  Muscle weakness (generalized)  Unsteadiness on feet  Other abnormalities of gait and mobility  History of falling  RATIONALE FOR EVALUATION AND TREATMENT: Rehabilitation  ONSET DATE: ~2013 - 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  Also more weakness in legs.   He has been dealing with some form of infection in his genital area which is thought to be fungal in origin - he is currently taking fluconazole  which seems to be helping with the infection symptoms but he is still feeling weaker than normal.  His legs feel so weak that he cannot get his platform 4WW in the car on his own.  He reports he had a fall yesterday where he feels that his shoe got caught in the carpet.  Pt accompanied by: significant other (spouse in waiting room)  PAIN: Are you having pain? Yes: NPRS scale: 5/10  Pain location: B LE  Pain description: aching  Aggravating factors: walking or moving around  Relieving factors: rest   PERTINENT HISTORY:  Anxiety, asthma, bipolar affective disorder, COPD, depression, DM-II, DJD, chronic knee pain, B TKA, GERD, HTN, Mnire's disease, obesity, pancreatitis, h/o CVA, neurologic dysphagia, tardive dyskinesia, HOH - L deaf & R hearing aid  PRECAUTIONS: Fall  RED FLAGS: None  WEIGHT BEARING RESTRICTIONS: No  FALLS:  Has patient fallen in last 6 months? No and before the levodopa  supplements, falls were daily (sometimes several times a day)  LIVING ENVIRONMENT: Lives with: lives with their spouse and dog Lives in: Apartment Stairs: No Has following equipment at home: Single point cane, Environmental consultant - 2 wheeled, shower chair, and Grab bars (platform RW)  OCCUPATION: Retired  PLOF: Independent, Independent with community mobility with device, and Leisure: watching TV, listening to books, programming, walking ~15 min (around the block) - tries to do daily   PATIENT GOALS: To get back to the point where I can walk to the car unassisted (no AD) and use platform RW less in community.  Be able to assist my wife with bringing groceries  in from the car.   OBJECTIVE: (objective measures completed at initial evaluation unless otherwise dated)  DIAGNOSTIC FINDINGS:  N/A - Multiple imaging studies in 2024 s/p falls but no acute  fractures.  COGNITION: Overall cognitive status: Impaired and History of cognitive impairments - at baseline - mostly memory issues resulting from ECT 19 yrs ago, better over last 3 months but still notes limited STM   SENSATION: WFL  COORDINATION: B Heel/toe WFL Heel to shin unable bilaterally   POSTURE:  rounded shoulders, forward head, and flexed trunk   MUSCLE LENGTH: Hamstrings: mod/severe tight B Piriformis: mild /mod tight R>L Hip flexors: mod tight   LOWER EXTREMITY ROM:    Grossly WFL except limited hip extension and rotation bilaterally  LOWER EXTREMITY MMT:    MMT Right eval Left eval R 09/06/24 L 09/06/24  Hip flexion 3+ 3+ 3+ 3+  Hip extension 3+ 3+ 3+ 3+  Hip abduction 3- 3 3- 3-  Hip adduction 3+ 3+ 3- 3-  Hip internal rotation 4- 3+ 4- 4-  Hip external rotation 4- 3+ 4- 4-  Knee flexion 4 4 4- 4  Knee extension 4 4 4- 4  Ankle dorsiflexion 4- 4- 4- 4  Ankle plantarflexion      Ankle inversion      Ankle eversion      (Blank rows = not tested)  BED MOBILITY:  SBA  TRANSFERS: Assistive device utilized: None  Sit to stand: Modified independence and SBA Stand to sit: Modified independence and SBA Chair to chair: SBA Floor: NT  GAIT: Distance walked: clinic distances Assistive device utilized: Single point cane, platform RW/2WW, and None Level of assistance: SBA Gait pattern: step through pattern, decreased arm swing- Right, decreased arm swing- Left, decreased stride length, decreased hip/knee flexion- Right, decreased hip/knee flexion- Left, decreased ankle dorsiflexion- Right, decreased ankle dorsiflexion- Left, shuffling, festinating, trunk flexed, poor foot clearance- Right, and poor foot clearance- Left Comments: gait deviations more pronounced with decreasing AD support  FUNCTIONAL TESTS:  5 times sit to stand: unable w/o UE assist - only able to complete 4 reps in 34.03 sec; 20.18 sec with B UE assist Timed up and go (TUG): Normal - 15.84  sec w/o AD, Manual - 15.19 sec w/o AD, Cognitive - 15.66 sec w/o AD 10 meter walk test: 18.25 sec w/o AD, 16.56 sec with SPC, 14.78 sec with platform RW Gait speed: 1.80 ft/sec w/o AD, 1.98 ft/sec with SPC, 2.22 ft/sec with platform RW Berg balance scale: 41/56, 37-45 indicates significant (>80%) risk for falls (06/26/24) DGI: 12/24, <19/24 indicates high risk for falls (06/26/24)  PATIENT SURVEYS:  ABC scale: 620 / 1600 = 38.8 %, indicating a low level of physical functioning (<69% indicates risk for recurrent falls in PD) 09/06/24 - ABC scale: 09/06/24 - 160 / 1600 = 10.0 %   TODAY'S TREATMENT:   09/06/2024 - Recertification THERAPEUTIC ACTIVITIES: To improve functional performance.  Demonstration, verbal and tactile cues throughout for technique. LE MMT 5xSTS = 32.06 sec with B UE assist TUG = 40.50 sec with platform 4WW ABC Scale: 09/06/24 - 160 / 1600 = 10.0 % = 28.63 sec with platform 4WW Gait speed = 1.15 ft/sec with platform 4WW Goal assessment   08/16/2024  THERAPEUTIC EXERCISE: To improve strength and endurance.  Demonstration, verbal and tactile cues throughout for technique. NuStep - L5 x 6 min (LE only)  THERAPEUTIC ACTIVITIES: To improve functional performance.  Demonstration, verbal and tactile cues throughout for technique. with  upright/platform 4WW = 15.16 sec (normal speed), 13.84 sec (fastest comfortable speed) Gait speed with upright/platform 4WW = 2.16 ft/sec (normal speed), 2.37 ft/sec (fastest comfortable speed)  SELF CARE:  Provided education on community resources related to Parkinson's including support group and educational sessions, community-based PWR! Moves and exercise/movement groups as well as online resources related to PD.   NEUROMUSCULAR RE-EDUCATION: To improve balance, coordination, kinesthesia, posture, proprioception, reduce fall risk, amplitude of movement, speed of movement to reduce bradykinesia, and reduce rigidity.  Standing  between backs of 2 chair for UE support with SBA of PT: PWR! Step forward x 10 bil - min A x 1 for posterior LOB PWR! Step backward x 10 bil PWR! Step through forward & backward x 10 bil  GAIT TRAINING: To normalize gait pattern and prepare to wean to LRAD. 40 ft with B hiking poles and CGA of PT via gait belt - cues for reciprocal sequencing with pt having difficulty achieving normal step pattern   08/13/24 Nustep L5x99min LE only ABC scale: 810 / 1600 = 50.6 % TUG = 21.01 sec with platform 4WW Completed BERG & DGI -- see below  Berg Balance Test   Sit to Stand Able to stand  independently using hands    Standing Unsupported Able to stand safely 2 minutes    Sitting with Back Unsupported but Feet Supported on Floor or Stool Able to sit safely and securely 2 minutes    Stand to Sit Sits safely with minimal use of hands    Transfers Able to transfer safely, minor use of hands    Standing Unsupported with Eyes Closed Able to stand 10 seconds safely    Standing Unsupported with Feet Together Able to place feet together independently and stand 1 minute safely    From Standing, Reach Forward with Outstretched Arm Can reach confidently >25 cm (10)    From Standing Position, Pick up Object from Floor Able to pick up shoe safely and easily    From Standing Position, Turn to Look Behind Over each Shoulder Looks behind from both sides and weight shifts well    Turn 360 Degrees Able to turn 360 degrees safely one side only in 4 seconds or less    Standing Unsupported, Alternately Place Feet on Step/Stool Able to stand independently and complete 8 steps >20 seconds    Standing Unsupported, One Foot in Front Able to take small step independently and hold 30 seconds    Standing on One Leg Unable to try or needs assist to prevent fall    Total Score 47      Dynamic Gait Index   Level Surface Moderate Impairment    Change in Gait Speed Mild Impairment    Gait with Horizontal Head Turns Mild  Impairment    Gait with Vertical Head Turns Mild Impairment    Gait and Pivot Turn Normal    Step Over Obstacle Mild Impairment    Step Around Obstacles Normal    Steps Moderate Impairment    Total Score 16       08/09/2024 THERAPEUTIC EXERCISE: To improve strength and endurance.   NuStep - L4 x 8 min (LE only) Gait around clinic with 270'  NEUROMUSCULAR RE-EDUCATION:  With UE support: Standing heel/toe lift x 10 B Standing hip abduction x 10 B Standing marching x 10 B Standing balance x 1 min; added EC 3x10 Sidesteps 3x down and back  PWR twist x 10    08/06/2024 THERAPEUTIC EXERCISE: To improve  strength and endurance.   NuStep - L4 x 8 min (LE only)  THERAPEUTIC ACTIVITIES: To improve functional performance.  Demonstration, verbal and tactile cues throughout for technique. TUG = 19.88 sec with platform 4WW (pt misjudged seat and attempted to sit on armrest of chair), 23.06 sec w/o AD 5xSTS = 20.50 sec with B UE assist, unable w/o UE assist  NEUROMUSCULAR RE-EDUCATION: To improve balance, coordination, kinesthesia, posture, proprioception, reduce fall risk, amplitude of movement, speed of movement to reduce bradykinesia, and reduce rigidity. Seated PWR! Moves: Up x 10 Rock x 10 Twist x 10 Step x 10  PWR! Sit to stand x 7 from Airex pad in chair Standing PWR! Moves (modified with chair in front for intermittent UE support as needed): Up x 10 Rock x 10 - one hand remaining on chair Twist x 10 - one hand remaining on chair Step x 8 - one hand remaining on chair   07/24/2024  THERAPEUTIC EXERCISE: To improve strength, endurance, ROM, and flexibility.  Demonstration, verbal and tactile cues throughout for technique.   - resistance reduced d/t c/o fatigue Gait 270 ft around clinic with walker   NEUROMUSCULAR RE-EDUCATION: To improve coordination, kinesthesia, posture, proprioception Seated:  PWR! Up 2x10  PWR Rock 2x10 each direction  PWR Twist 2x10 each direction   PWR step x 5 each direction very difficult  Sit to stand x 5   07/19/2024  THERAPEUTIC EXERCISE: To improve strength, endurance, ROM, and flexibility.  Demonstration, verbal and tactile cues throughout for technique.  NuStep - L3 x 8 min (LE only) - resistance reduced d/t c/o fatigue Seated marching 2lb 2x10 BLE Seated LAQ 2lb 2x10 BLE Seated hamstring stretch x 30 B Seated piriformis stretch fig 4 x 30 B Gait 90 ft around clinic with walker   NEUROMUSCULAR RE-EDUCATION: To improve coordination, kinesthesia, posture, proprioception Seated diagonals with RTB x 10 B Seated rows GTB 2x10 Seated shoulder ext GTB 2x10 Sit to stands x 5 Standing ant/post WS in staggered stance x 10 B with walker   07/12/2024  THERAPEUTIC EXERCISE: To improve strength, endurance, ROM, and flexibility.  Demonstration, verbal and tactile cues throughout for technique.  NuStep - L3 x 6 min (LE only) - resistance reduced d/t c/o fatigue Seated hip hinge HS stretch 3 x 30 bil Seated hip hinge figure-4 piriformis stretch 3 x 30 bil Seated hip flexor stretch in lunge position over edge of seat 3 x 30 bil  NEUROMUSCULAR RE-EDUCATION: To improve coordination, kinesthesia, posture, proprioception, reduce fall risk, amplitude of movement, speed of movement to reduce bradykinesia, and reduce rigidity. Seated PWR! Moves: Up x 10 Rock x 5 bil Twist - deferred d/t increased shoulder pain Step x 5 bil Seated scapular retraction into pool noodle along spine and back of chair 10 x 3 Seated GTB scapular retraction + B shoulder row 10 x 3 Seated GTB scapular retraction + B shoulder extension 10 x 3    PATIENT EDUCATION:  Education details: HEP update - proximal LE stretches, transfer safety, gait safety with platform rollator, and Check for Safety - Home Fall Prevention Checklist for Older Adults   Person educated: Patient Education method: Explanation, Demonstration, Verbal cues, Handouts, and MedBridgeGO app  access provided Education comprehension: verbalized understanding, returned demonstration, verbal cues required, and needs further education  HOME EXERCISE PROGRAM: Access Code: [email or web address] URL: [email or web address] Date: 07/12/2024 Prepared by: Elijah Hidden  Exercises - Seated Hip Abduction with Resistance  - 1 x daily - 3 x weekly - 3  sets - 10 reps - Seated Hip Adduction Isometrics with Ball  - 1 x daily - 3 x weekly - 3 sets - 10 reps - Seated March with Resistance  - 1 x daily - 3 x weekly - 3 sets - 10 reps - Sit to Stand  - 1 x daily - 3 x weekly - 1 sets - 5 reps - Seated Hamstring Stretch  - 1-2 x daily - 7 x weekly - 3 reps - 30 sec hold - Seated Figure 4 Piriformis Stretch  - 1-2 x daily - 7 x weekly - 3 reps - 30 sec hold - Seated Hip Flexor Stretch  - 1-2 x daily - 7 x weekly - 3 reps - 30 sec hold  Patient Education - Check for Safety  PWR Moves: - Sitting - Standing (modified)   ASSESSMENT:  CLINICAL IMPRESSION: Antonia returns to PT after a 3-week gap due to miscommunication about scheduling and insurance prior authorization.  He notes worsening of his mobility over the past week or so, with increased achiness and weakness in his LEs, increased instability, and reduced activity tolerance.  He had a fall yesterday where he states his shoe got caught on the carpet.  He reports he has been being treated for a genital yeast infection but does not feel that the treatments are working and feels that he needs antibiotics instead.  He questions whether the current infection may be contributing to his worsening overall functional status.  Reassessment of LE strength demonstrates slight worsening compared to his initial eval.  Standardized balance testing revealing significant declines on 5xSTS, TUG and 10MWT/gait speed, with current values below those at his initial eval despite previous progress observed at prior interval testing - all testing indicating a high risk for  falls.  ABC scale score of only 10% balance confidence also indicating a very low level of physical functioning with high risk for recurrent falls, also demonstrating a decline from his eval and interval assessment scores.  Given recent decline in function (potentially associated with his current infection), Jaskirat has not met the majority of his PT goals and will benefit from continued skilled PT to address ongoing strength, balance and coordination deficits to improve mobility and activity tolerance with decreased pain interference and decreased risk for falls, therefore will recommend recert for additional 2x/wk for up to 8 weeks.   OBJECTIVE IMPAIRMENTS: Abnormal gait, decreased activity tolerance, decreased balance, decreased coordination, decreased endurance, decreased knowledge of condition, decreased knowledge of use of DME, decreased mobility, difficulty walking, decreased strength, decreased safety awareness, dizziness, impaired perceived functional ability, impaired flexibility, improper body mechanics, postural dysfunction, and pain.   ACTIVITY LIMITATIONS: carrying, lifting, bending, standing, squatting, stairs, transfers, bed mobility, reach over head, locomotion level, and caring for others  PARTICIPATION LIMITATIONS: meal prep, cleaning, laundry, shopping, and community activity  PERSONAL FACTORS: Age, Fitness, Past/current experiences, Time since onset of injury/illness/exacerbation, and 3+ comorbidities: Anxiety, asthma, bipolar affective disorder, COPD, depression, DM-II, DJD, chronic knee pain, B TKA, GERD, HTN, Mnire's disease, obesity, pancreatitis, h/o CVA, neurologic dysphagia, tardive dyskinesia, HOH - L deaf & R hearing aid are also affecting patient's functional outcome.   REHAB POTENTIAL: Good  CLINICAL DECISION MAKING: Unstable/unpredictable  EVALUATION COMPLEXITY: High   GOALS: Goals reviewed with patient? Yes  SHORT TERM GOALS: Target date: 07/30/2024, extended to  10/04/2024  Patient will be independent with initial HEP. Baseline: To be established 07/05/24 - consistent with current HEP  07/12/24 - clarified HEP frequency Goal  status: MET - 08/06/24   2.  Patient will demonstrate decreased fall risk by scoring < 13.5 sec on normal TUG. Baseline: 15.84 sec w/o AD 08/06/24 - 19.88 sec with platform 4WW (pt misjudged seat and attempted to sit on armrest of chair), 23.06 sec w/o AD 08/13/24 - 21.01 sec with platform 4WW Goal status: IN PROGRESS - 09/05/24 - 40.50 sec with platform 4WW  3.  Patient will be educated on strategies to decrease risk of falls.  Baseline:  Goal status: MET - 07/09/24   LONG TERM GOALS: Target date: 09/10/2024, extended to 11/01/2024  Patient will be independent with ongoing/advanced HEP for self-management at home incorporating PWR! Moves as indicated .  Baseline:  Goal status: IN PROGRESS - 08/13/24 - met for current HEP   2.  Patient will be able to ambulate 600' with LRAD with good safety to access community.  Baseline:  Goal status: IN PROGRESS - 08/09/24 - 270' with standing 4WW  3.  Patient will be able to step up/down curb safely with LRAD for safety with community ambulation.  Baseline:  Goal status: IN PROGRESS - 08/16/24 - pt reports he does not attempt curbs with platform 4WW as walker too heavy to navigate up curb  4.  Patient will demonstrate gait speed of >/= 2.62 ft/sec (0.8 m/s) to be a safe limited community ambulator with decreased risk for recurrent falls.  Baseline: 1.80 ft/sec w/o AD, 1.98 ft/sec with SPC and 2.22 ft/sec with platform RW 07/19/24 - 2.26 ft/sec 08/16/24 - 2.16 ft/sec (normal speed), 2.37 ft/sec (fastest comfortable speed) with upright/platform 4WW Goal status: IN PROGRESS - 09/06/24 - 1.15 ft/sec with platform 4WW  5.  Patient will improve 5x STS time to </= 15 seconds w/o need for UE assist to demonstrate improved functional strength and transfer efficiency. Baseline: unable w/o UE assist  - only able to complete 4 reps in 34.03 sec; 20.18 sec with B UE assist 08/06/24 - 20.50 sec with B UE assist Goal status: IN PROGRESS - 09/06/24 - 32.06 sec with B UE assist  6.  Patient will demonstrate at least 19/24 on DGI to improve gait stability and reduce risk for falls. Baseline: 12/24 (06/26/24) Goal status: IN PROGRESS - 08/13/24 - 16/24  7.  Patient will improve Berg score by at least 8 points to improve safety and stability with ADLs in standing and reduce risk for falls. (MCID = 8 points)   Baseline: 41/56 (06/26/24) Goal status: IN PROGRESS - 08/13/24 - 47/56  8.  Patient will report >/= 52% on ABC scale to demonstrate improved balance confidence and decreased risk for falls. Baseline: 620 / 1600 = 38.8 % 08/13/24 - 810 / 1600 = 50.6 % Goal status: IN PROGRESS - 09/06/24 - 160 / 1600 = 10.0 %  9. Patient will verbalize understanding of local Parkinson's disease community resources, including community fitness post d/c. Baseline:  Goal status: IN PROGRESS - 08/16/24 - handouts provided   PLAN:  PT FREQUENCY: 2x/week  PT DURATION: 12 weeks  PLANNED INTERVENTIONS: [id number]- PT Re-evaluation, 97750- Physical Performance Testing, 97110-Therapeutic exercises, 97530- Therapeutic activity, [id number]- Neuromuscular re-education, 97535- Self Care, [id number]- Manual therapy, [id number]- Gait training, [phone]- Canalith repositioning, [id number]- Electrical stimulation (unattended), 97035- Ultrasound, 02966- Ionotophoresis 4mg /ml Dexamethasone , 79439 (1-2 muscles), 20561 (3+ muscles)- Dry Needling, Patient/Family education, Balance training, Stair training, Taping, Joint mobilization, Vestibular training, DME instructions, Cryotherapy, and Moist heat  PLAN FOR NEXT SESSION: continue LE strengthening; balance activities (both static and dynamic); PWR! Moves  Elijah CHRISTELLA Hidden, PT  09/06/2024, 1:08 PM       Date of referral: 05/31/24 Referring provider: Amon Aloysius BRAVO, MD Referring diagnosis?   G21.11,T43.505A (ICD-10-CM) - Neuroleptic-induced parkinsonism (HCC)  R26.9 (ICD-10-CM) - Gait disorder  R29.6 (ICD-10-CM) - Multiple falls   Treatment diagnosis? (if different than referring diagnosis)  Muscle weakness (generalized)  Unsteadiness on feet  Other abnormalities of gait and mobility  History of falling  What was this (referring dx) caused by? Ongoing Issue and Other: neurodegenerative disease  Nature of Condition: Chronic (continuous duration > 3 months)   Laterality: Both  Current Functional Measure Score: Other ABC Scale: 810 / 1600 = 50.6 %, indicating a moderate level of physical functioning (<69% indicates risk for recurrent falls in PD)    Objective measurements identify impairments when they are compared to normal values, the uninvolved extremity, and prior level of function.  [x]  Yes  []  No  Objective assessment of functional ability: Moderate functional limitations   Briefly describe symptoms:  Alfio is progressing with PT demonstrating improving tolerance for standing activities, improving gait safety and speed with his platform 4WW, and decreasing fall risk per improvements in Berg from 41/56 to 47/56 (indicating a reduction in fall risk from significant (greater than 80%) to moderate moderate (>50%) and DGI from 12/24 to 16/24 (scores of <19/24 still indicating high risk for falls) today.  He has not had any falls in the past month.  Jovan will benefit from continued skilled PT to address ongoing motor control, strength and balance deficits to improve mobility and activity tolerance with decreased pain interference and decreased risk for falls.   How did symptoms start: gradual onset  Average pain intensity:  Last 24 hours: 2/10  Past week: 2/10  How often does the pt experience symptoms? Frequently  How much have the symptoms interfered with usual daily activities? Moderately  How has condition changed since care began at this facility? A little  better   In general, how is the patients overall health? Fair  Onset date: Parkinsonism diagnosis in 2013, worsening weakness over the past 6 months    BACK PAIN (STarT Back Screening Tool) - (When applicable):   Has your back pain spread down your leg(s) at sometime in the last 2 weeks? []  Yes   [x]  No Have you had pain in the shoulder or neck at sometime in the past 2 weeks? [x]  Yes   []  No Have you only walked short distances because of your back pain? []  Yes   [x]  No In the past 2 weeks, have you dressed more slowly than usual because of your back pain? []  Yes   [x]  No Do you think it is not really safe for person with a condition like yours to be physically active? []  Yes   [x]  No Have worrying thoughts been going through your mind a lot of the time? [x]  Yes   []  No Do you feel that your back pain is terrible and it is never going to get any better? []  Yes   [x]  No In general, have you stopped enjoying all the things you usually enjoy? [x]  Yes   []  No Overall, how bothersome has your back pain been in the last 2 weeks? []  Not at all   [x]  Slightly     []  Moderate   []  Very much     []  Extremely

## 2024-09-07 ENCOUNTER — Encounter: Payer: Self-pay | Admitting: Family

## 2024-09-07 ENCOUNTER — Ambulatory Visit (INDEPENDENT_AMBULATORY_CARE_PROVIDER_SITE_OTHER): Admitting: Family

## 2024-09-07 VITALS — BP 132/82 | HR 83 | Temp 97.9°F | Resp 20 | Ht 70.0 in | Wt 217.8 lb

## 2024-09-07 DIAGNOSIS — E1169 Type 2 diabetes mellitus with other specified complication: Secondary | ICD-10-CM | POA: Diagnosis not present

## 2024-09-07 DIAGNOSIS — N471 Phimosis: Secondary | ICD-10-CM

## 2024-09-07 DIAGNOSIS — N478 Other disorders of prepuce: Secondary | ICD-10-CM | POA: Diagnosis not present

## 2024-09-07 DIAGNOSIS — E1142 Type 2 diabetes mellitus with diabetic polyneuropathy: Secondary | ICD-10-CM

## 2024-09-07 DIAGNOSIS — E785 Hyperlipidemia, unspecified: Secondary | ICD-10-CM | POA: Diagnosis not present

## 2024-09-07 DIAGNOSIS — R300 Dysuria: Secondary | ICD-10-CM

## 2024-09-07 DIAGNOSIS — K219 Gastro-esophageal reflux disease without esophagitis: Secondary | ICD-10-CM

## 2024-09-07 LAB — LIPID PANEL
Cholesterol: 191 mg/dL (ref 0–200)
HDL: 61.7 mg/dL (ref >39.00)
LDL Cholesterol: 110 mg/dL — ABNORMAL HIGH (ref 0–99)
NonHDL: 129.19
Total CHOL/HDL Ratio: 3
Triglycerides: 96 mg/dL (ref 0.0–149.0)
VLDL: 19.2 mg/dL (ref 0.0–40.0)

## 2024-09-07 LAB — CBC WITH DIFFERENTIAL/PLATELET
Basophils Absolute: 0 K/uL (ref 0.0–0.1)
Basophils Relative: 0.4 % (ref 0.0–3.0)
Eosinophils Absolute: 0.2 K/uL (ref 0.0–0.7)
Eosinophils Relative: 2 % (ref 0.0–5.0)
HCT: 35.2 % — ABNORMAL LOW (ref 39.0–52.0)
Hemoglobin: 11.1 g/dL — ABNORMAL LOW (ref 13.0–17.0)
Lymphocytes Relative: 24.7 % (ref 12.0–46.0)
Lymphs Abs: 2.7 K/uL (ref 0.7–4.0)
MCHC: 31.4 g/dL (ref 30.0–36.0)
MCV: 79.4 fl (ref 78.0–100.0)
Monocytes Absolute: 1 K/uL (ref 0.1–1.0)
Monocytes Relative: 8.9 % (ref 3.0–12.0)
Neutro Abs: 6.9 K/uL (ref 1.4–7.7)
Neutrophils Relative %: 64 % (ref 43.0–77.0)
Platelets: 386 K/uL (ref 150.0–400.0)
RBC: 4.44 Mil/uL (ref 4.22–5.81)
RDW: 17.8 % — ABNORMAL HIGH (ref 11.5–15.5)
WBC: 10.8 K/uL — ABNORMAL HIGH (ref 4.0–10.5)

## 2024-09-07 LAB — BASIC METABOLIC PANEL WITH GFR
BUN: 25 mg/dL — ABNORMAL HIGH (ref 6–23)
CO2: 27 meq/L (ref 19–32)
Calcium: 9.4 mg/dL (ref 8.4–10.5)
Chloride: 103 meq/L (ref 96–112)
Creatinine, Ser: 0.97 mg/dL (ref 0.40–1.50)
GFR: 75.96 mL/min (ref >60.00)
Glucose, Bld: 144 mg/dL — ABNORMAL HIGH (ref 70–99)
Potassium: 5.1 meq/L (ref 3.5–5.1)
Sodium: 140 meq/L (ref 135–145)

## 2024-09-07 LAB — HEMOGLOBIN A1C: Hgb A1c MFr Bld: 7.2 % — ABNORMAL HIGH (ref 4.6–6.5)

## 2024-09-07 MED ORDER — CIPROFLOXACIN HCL 250 MG PO TABS: 250.0000 mg | ORAL_TABLET | Freq: Two times a day (BID) | ORAL | 0 refills | Status: AC

## 2024-09-08 ENCOUNTER — Encounter (HOSPITAL_BASED_OUTPATIENT_CLINIC_OR_DEPARTMENT_OTHER): Payer: Self-pay | Admitting: Emergency Medicine

## 2024-09-08 ENCOUNTER — Other Ambulatory Visit: Payer: Self-pay | Admitting: Internal Medicine

## 2024-09-08 ENCOUNTER — Emergency Department (HOSPITAL_BASED_OUTPATIENT_CLINIC_OR_DEPARTMENT_OTHER)

## 2024-09-08 ENCOUNTER — Emergency Department (HOSPITAL_BASED_OUTPATIENT_CLINIC_OR_DEPARTMENT_OTHER): Admission: EM | Admit: 2024-09-08 | Discharge: 2024-09-09 | Disposition: A | Discharge status: Home / Self Care

## 2024-09-08 ENCOUNTER — Other Ambulatory Visit: Payer: Self-pay

## 2024-09-08 DIAGNOSIS — R531 Weakness: Secondary | ICD-10-CM | POA: Diagnosis not present

## 2024-09-08 DIAGNOSIS — G20C Parkinsonism, unspecified: Secondary | ICD-10-CM | POA: Diagnosis not present

## 2024-09-08 DIAGNOSIS — Z7902 Long term (current) use of antithrombotics/antiplatelets: Secondary | ICD-10-CM | POA: Insufficient documentation

## 2024-09-08 DIAGNOSIS — R29818 Other symptoms and signs involving the nervous system: Secondary | ICD-10-CM | POA: Diagnosis not present

## 2024-09-08 DIAGNOSIS — I10 Essential (primary) hypertension: Secondary | ICD-10-CM | POA: Diagnosis not present

## 2024-09-08 DIAGNOSIS — Z79899 Other long term (current) drug therapy: Secondary | ICD-10-CM | POA: Insufficient documentation

## 2024-09-08 DIAGNOSIS — E119 Type 2 diabetes mellitus without complications: Secondary | ICD-10-CM | POA: Insufficient documentation

## 2024-09-08 LAB — URINALYSIS, ROUTINE W REFLEX MICROSCOPIC
Bilirubin Urine: NEGATIVE
Glucose, UA: NEGATIVE mg/dL
Hgb urine dipstick: NEGATIVE
Ketones, ur: NEGATIVE mg/dL
Leukocytes,Ua: NEGATIVE
Nitrite: NEGATIVE
Protein, ur: NEGATIVE mg/dL
Specific Gravity, Urine: 1.02 (ref 1.005–1.030)
pH: 8.5 — ABNORMAL HIGH (ref 5.0–8.0)

## 2024-09-08 LAB — COMPREHENSIVE METABOLIC PANEL WITH GFR
ALT: <5 U/L (ref 0–44)
AST: 18 U/L (ref 15–41)
Albumin: 3.8 g/dL (ref 3.5–5.0)
Alkaline Phosphatase: 77 U/L (ref 38–126)
Anion gap: 12 (ref 5–15)
BUN: 19 mg/dL (ref 8–23)
CO2: 24 mmol/L (ref 22–32)
Calcium: 8.8 mg/dL — ABNORMAL LOW (ref 8.9–10.3)
Chloride: 103 mmol/L (ref 98–111)
Creatinine, Ser: 0.93 mg/dL (ref 0.61–1.24)
GFR, Estimated: >60 mL/min (ref >60)
Glucose, Bld: 94 mg/dL (ref 70–99)
Potassium: 4.4 mmol/L (ref 3.5–5.1)
Sodium: 139 mmol/L (ref 135–145)
Total Bilirubin: 0.5 mg/dL (ref 0.0–1.2)
Total Protein: 6.1 g/dL — ABNORMAL LOW (ref 6.5–8.1)

## 2024-09-08 LAB — CK: Total CK: 284 U/L (ref 49–397)

## 2024-09-08 LAB — AMMONIA: Ammonia: <13 umol/L (ref 9–35)

## 2024-09-08 LAB — CBC
HCT: 31.2 % — ABNORMAL LOW (ref 39.0–52.0)
Hemoglobin: 9.8 g/dL — ABNORMAL LOW (ref 13.0–17.0)
MCH: 25.1 pg — ABNORMAL LOW (ref 26.0–34.0)
MCHC: 31.4 g/dL (ref 30.0–36.0)
MCV: 79.8 fL — ABNORMAL LOW (ref 80.0–100.0)
Platelets: 299 K/uL (ref 150–400)
RBC: 3.91 MIL/uL — ABNORMAL LOW (ref 4.22–5.81)
RDW: 17.4 % — ABNORMAL HIGH (ref 11.5–15.5)
WBC: 7.2 K/uL (ref 4.0–10.5)
nRBC: 0 % (ref 0.0–0.2)

## 2024-09-08 LAB — MAGNESIUM: Magnesium: 1.4 mg/dL — ABNORMAL LOW (ref 1.7–2.4)

## 2024-09-08 LAB — LIPASE, BLOOD: Lipase: 11 U/L (ref 11–51)

## 2024-09-08 LAB — LACTIC ACID, PLASMA: Lactic Acid, Venous: 1.4 mmol/L (ref 0.5–1.9)

## 2024-09-08 MED ORDER — CLOPIDOGREL BISULFATE 75 MG PO TABS: 75.0000 mg | ORAL_TABLET | Freq: Every day | ORAL | Status: DC

## 2024-09-08 MED ORDER — CARBIDOPA-LEVODOPA 25-100 MG PO TABS
1.0000 | ORAL_TABLET | Freq: Three times a day (TID) | ORAL | Status: DC
Start: 2024-09-08 — End: 2024-09-09
  Filled 2024-09-08: qty 1

## 2024-09-08 MED ORDER — MAGNESIUM SULFATE 2 GM/50ML IV SOLN
2.0000 g | Freq: Once | INTRAVENOUS | Status: AC
  Administered 2024-09-08: 2 g via INTRAVENOUS
  Filled 2024-09-08: qty 50

## 2024-09-08 MED ORDER — ALBUTEROL SULFATE HFA 108 (90 BASE) MCG/ACT IN AERS: 2.0000 | INHALATION_SPRAY | Freq: Four times a day (QID) | RESPIRATORY_TRACT | Status: DC | PRN

## 2024-09-08 MED ORDER — ASENAPINE MALEATE 5 MG SL SUBL
5.0000 mg | SUBLINGUAL_TABLET | Freq: Every day | SUBLINGUAL | Status: DC
  Filled 2024-09-08: qty 1

## 2024-09-08 MED ORDER — LOSARTAN POTASSIUM 25 MG PO TABS: 25.0000 mg | ORAL_TABLET | Freq: Every evening | ORAL | Status: DC

## 2024-09-08 MED ORDER — LAMOTRIGINE 25 MG PO TABS
50.0000 mg | ORAL_TABLET | Freq: Every day | ORAL | Status: DC
  Administered 2024-09-09: 50 mg via ORAL
  Filled 2024-09-08: qty 2

## 2024-09-08 MED ORDER — MAGNESIUM SULFATE 50 % IJ SOLN: 2.0000 g | Freq: Once | INTRAMUSCULAR | Status: DC

## 2024-09-08 NOTE — ED Provider Notes (Signed)
 [place] [facility] AT [facility] Provider Note   CSN: [id number] Arrival date & time: 09/08/24  1911     Patient presents with: Extremity Weakness   SAED Ryan Zhang is a 78 y.o. male.   This is a 78 year old male presenting emergency department with difficulty ambulating.  Reports some chronic ambulatory dysfunction at baseline due to age and sedentary lifestyle that has been ongoing for several months has been working with physical therapy.  Over the past week ambulation has been mor difficult and acutely worsened today. He states he is unable to stand or walk because he is having spaslms of his legs and does not feel like he can control his legs.  They spasm, lock up on him and jerk. He has normal sensation.  Denies headache, vision loss, facial droop, altered sensorium.  Aphasia.  He is not having back pain, no bowel or bladder incontinence.  No saddle anesthesia.  No recent trauma.Ryan Zhang  He was started on  fluconazole  for issues related to his phimosis.  Saw his primary doctor yesterday who did basic labs and started him on antibiotics for UTI. Otherwise no infectious symptoms. He has no complaints sitting in bed and notes symptoms only with ambulation.    Extremity Weakness       Prior to Admission medications   Medication Sig Start Date End Date Taking? Authorizing Provider  albuterol  (PROVENTIL ) (2.5 MG/3ML) 0.083% nebulizer solution TAKE (2.5MG  TOTAL) BY NEBULIZATION EVERY 6 HOURS AS NEEDED FOR WHEEZING OR SHORTNESS OF BREATH 10/15/23   Amon Aloysius BRAVO, MD  albuterol  (VENTOLIN  HFA) [phone] Base) MCG/ACT inhaler Inhale 2 puffs into the lungs every 6 (six) hours as needed for wheezing or shortness of breath. 02/11/20   Paz, Jose E, MD  asenapine  (SAPHRIS ) 5 MG SUBL 24 hr tablet Place 5 mg under the tongue at bedtime.    [provider]  Blood Glucose Monitoring Suppl (ONETOUCH VERIO REFLECT) w/Device KIT Check blood sugars 1-2 times daily 10/19/23   Paz,  Jose E, MD  budesonide -formoterol (SYMBICORT) 160-4.5 MCG/ACT inhaler Inhale 2 puffs into the lungs 2 (two) times daily.     [provider]  buPROPion (WELLBUTRIN XL) 150 MG 24 hr tablet Take 150 mg by mouth daily. Patient taking differently: Take 300 mg by mouth daily. 02/15/21   [provider]  carbidopa -levodopa  (SINEMET  IR) 25-100 MG tablet Take 1 tablet by mouth 3 (three) times daily. 8am/noon/4pm 07/17/24   Tat, Asberry RAMAN, DO  cholecalciferol (VITAMIN D3) 25 MCG (1000 UNIT) tablet Take 1,000 Units by mouth daily.    [provider]  ciprofloxacin  (CIPRO ) 250 MG tablet Take 1 tablet (250 mg total) by mouth 2 (two) times daily for 7 days. 09/07/24 09/14/24  Webb, Padonda B, FNP  clopidogrel  (PLAVIX ) 75 MG tablet Take 1 tablet (75 mg total) by mouth daily. 07/10/24   Amon Aloysius BRAVO, MD  DULoxetine  (CYMBALTA ) 60 MG capsule Take 60 mg by mouth every morning.    [provider]  fluconazole  (DIFLUCAN ) 150 MG tablet Day 1: Take 2 tablets daily. Day 2-14: Take 1 tablet daily. 08/29/24   Christopher Savannah, PA-C  fluticasone  (FLONASE ) 50 MCG/ACT nasal spray  09/21/22   [provider]  lamoTRIgine (LAMICTAL) 25 MG tablet Take 50 mg by mouth at bedtime.    [provider]  Lancets JANETT ULTRASOFT) lancets Check blood sugars no more than twice daily 12/09/16   Paz, Jose E, MD  losartan  (COZAAR ) 25 MG  tablet Take 1 tablet (25 mg total) by mouth every evening. 03/12/24   Paz, Jose E, MD  montelukast (SINGULAIR) 10 MG tablet Take 10 mg by mouth at bedtime. Ryan Zhang    [provider]  Multiple Vitamin (MULTIVITAMIN) tablet Take 1 tablet by mouth daily. Herbalist, Historical, MD  naproxen  (NAPROSYN ) 500 MG tablet Take 500 mg by mouth daily as needed.    [provider]  nystatin  cream (MYCOSTATIN ) Apply to affected area 2 times daily. 08/21/24   Christopher Savannah, PA-C  ONETOUCH ULTRA test strip USE TO TEST GLUCOSE LEVELS 2 TIMES  DAILY 06/29/23   Paz, Jose E, MD  pantoprazole  (PROTONIX ) 40 MG tablet Take 1 tablet (40 mg total) by mouth daily before breakfast. 08/06/24   Paz, Jose E, MD  pioglitazone -metformin  (ACTOPLUS MET ) 15-850 MG tablet TAKE ONE TABLET BY MOUTH TWICE DAILY WITH A MEAL 06/25/24   Paz, Jose E, MD  simvastatin  (ZOCOR ) 40 MG tablet Take 1 tablet (40 mg total) by mouth at bedtime. 05/21/24   Amon Aloysius BRAVO, MD  solifenacin  (VESICARE ) 10 MG tablet Take 1 tablet (10 mg total) by mouth daily. 05/02/24   Ryan Zhang., MD  triamcinolone  cream (KENALOG ) 0.1 % Apply 1 Application topically 2 (two) times daily. 08/21/24   Christopher Savannah, PA-C    Allergies: Celexa  [citalopram hydrobromide], Depakote  [divalproex sodium], Methocarbamol, Paroxetine hcl, Smz-tmp ds [sulfamethoxazole-trimethoprim], Other, Oxycodone-acetaminophen , and Risperidone    Review of Systems  Musculoskeletal:  Positive for extremity weakness.    Updated Vital Signs BP 127/72   Pulse 84   Temp (!) 97.4 F (36.3 C)   Resp 20   Ht 5' 10 (1.778 m)   Wt 98.8 kg   SpO2 97%   BMI 31.25 kg/m   Physical Exam Vitals and nursing note reviewed.  Constitutional:      General: He is not in acute distress.    Appearance: He is not toxic-appearing.  HENT:     Head: Normocephalic and atraumatic.     Nose: Nose normal.     Mouth/Throat:     Mouth: Mucous membranes are moist.  Eyes:     Conjunctiva/sclera: Conjunctivae normal.  Cardiovascular:     Rate and Rhythm: Normal rate and regular rhythm.  Pulmonary:     Effort: Pulmonary effort is normal.     Breath sounds: Normal breath sounds.  Abdominal:     General: Abdomen is flat. There is no distension.     Palpations: Abdomen is soft.     Tenderness: There is no abdominal tenderness. There is no guarding or rebound.  Musculoskeletal:        General: Normal range of motion.     Comments: No midline spinal tenderness.  Has equal strength in bilateral lower extremities albeit slightly  diminished 4 out of 5.  But strong plantarflexion dorsiflexion, able to lift his legs up off the bed.  Does have some rigidity.   Skin:    General: Skin is warm and dry.     Capillary Refill: Capillary refill takes less than 2 seconds.  Neurological:     Mental Status: He is alert and oriented to person, place, and time.  Psychiatric:        Mood and Affect: Mood normal.        Behavior: Behavior normal.     (all labs ordered are listed, but only abnormal results are displayed) Labs Reviewed  CBC - Abnormal; Notable for  the following components:      Result Value   RBC 3.91 (*)    Hemoglobin 9.8 (*)    HCT 31.2 (*)    MCV 79.8 (*)    MCH 25.1 (*)    RDW 17.4 (*)    All other components within normal limits  COMPREHENSIVE METABOLIC PANEL WITH GFR - Abnormal; Notable for the following components:   Calcium 8.8 (*)    Total Protein 6.1 (*)    All other components within normal limits  URINALYSIS, ROUTINE W REFLEX MICROSCOPIC - Abnormal; Notable for the following components:   pH 8.5 (*)    All other components within normal limits  MAGNESIUM  - Abnormal; Notable for the following components:   Magnesium  1.4 (*)    All other components within normal limits  LIPASE, BLOOD  CK  LACTIC ACID, PLASMA  AMMONIA  LACTIC ACID, PLASMA  LAMOTRIGINE LEVEL    EKG: None  Radiology: DG Chest 2 View Result Date: 09/08/2024 EXAM: 2 VIEW(S) XRAY OF THE CHEST 09/08/2024 09:33:00 PM COMPARISON: 03/14/2024 CLINICAL HISTORY: infection?. Pt reports increased weakness and spasms/jerking movements to BLE over the last week FINDINGS: LUNGS AND PLEURA: Mild left lower lung scarring/atelectasis. No focal pulmonary opacity. No pulmonary edema. No pleural effusion. No pneumothorax. HEART AND MEDIASTINUM: Cardiomegaly. Aortic arch calcifications. BONES AND SOFT TISSUES: No acute osseous abnormality. IMPRESSION: 1. No acute cardiopulmonary process. 2. Cardiomegaly. Electronically signed by: Pinkie Pebbles MD 09/08/2024 09:41 PM EDT RP Workstation: [id number]   CT Head Wo Contrast Result Date: 09/08/2024 EXAM: CT HEAD WITHOUT CONTRAST 09/08/2024 09:33:00 PM TECHNIQUE: CT of the head was performed without the administration of intravenous contrast. Automated exposure control, iterative reconstruction, and/or weight based adjustment of the mA/kV was utilized to reduce the radiation dose to as low as reasonably achievable. COMPARISON: CT 05/24/2023 CLINICAL HISTORY: Neuro deficit, acute, stroke suspected. Pt reports increased weakness and spasms/jerking movements to BLE over the last week. FINDINGS: BRAIN AND VENTRICLES: No acute hemorrhage. No evidence of acute infarct. No hydrocephalus. No extra-axial collection. No mass effect or midline shift. ORBITS: No acute abnormality. SINUSES: No acute abnormality. SOFT TISSUES AND SKULL: No acute soft tissue abnormality. No skull fracture. IMPRESSION: 1. No acute intracranial abnormality. Electronically signed by: Gilmore Molt MD 09/08/2024 09:40 PM EDT RP Workstation: [id number]     Procedures   Medications Ordered in the ED  magnesium  sulfate IVPB 2 g 50 mL (2 g Intravenous New Bag/Given 09/08/24 2139)    Clinical Course as of 09/08/24 2304  Sat Sep 08, 2024  1944 Saw nuerology on 07/17/24:  1.  Neuroleptic induced parkinsonism             -As when I saw the patient back in 2015, he continues to have secondary parkinsonism secondary to Saphris , only it has progressed since that time.  We told the patient back in 2015 that symptoms certainly could progress if unable to get off the medication, which it did. -DaTscan done in 2014 was unremarkable. -Levodopa  was started in August, 2024.  Patient does think that levodopa  has helped his parkinsonism significantly but is a bit more imbalanced.  Unfortunately, since the saphris  is the ultimate source of the parkinsonism, pt would have to address that with psychiatry.  He does state that he does not  think of anything can be done with the Saphris . -Patient is still on Saphris  for bipolar     2.  Tardive dyskinesia             -  This really is in the form of oral buccal lingual dyskinesia and started before levodopa .  This is certainly from Saphris .  It is mild    [TY]  2119 Magnesium (!): 1.4 Low magnesium .  Will replete [TY]  2120 Comprehensive metabolic panel(!) Electrolytes otherwise normal.  Normal kidney function.  No transaminitis to suggest hepatobiliary disease [TY]  2120 CBC(!) No leukocytosis to suggest stomach infection.  Hemoglobin 9.8. [TY]  2120 CK Total: 284 [TY]  2142 CT Head Wo Contrast IMPRESSION: 1. No acute intracranial abnormality.   [TY]  2303 Spoke with hospitalist for admission.  [TY]    Clinical Course User Index [TY] Neysa Caron PARAS, DO                                 Medical Decision Making This is a 78 year old male with complex past medical history to include diabetes, hypertension, hyperlipidemia, bipolar, Mnire's and drug-induced parkinsonianism who presented with muscle spasms and difficulty ambulating.  He is afebrile nontachycardic, normotensive.  Maintaining oxygen  saturation room air.  He has no specific complaints while lying in the bed, but notes he cannot ambulate secondary to muscle spasms and not controlling his legs.  He has no focal neurologic deficits on exam and appears to have good strength in his lower extremities.  Does have some rigidity.  He is not having other deficits to suspect acute stroke.  CT head also negative.  Broad workup as noted in ED course significant for low magnesium  which was repleted.  I suspect his symptoms secondary to symptomatic hypomagnesemia.  Will plan for admission for further electrolyte repletion and ensure patient can return to his baseline function.  Amount and/or Complexity of Data Reviewed Independent Historian:     Details: Wife notes started on antibiotic yesterday thinks it is cephalexin  that she  is unsure External Data Reviewed:     Details: See ED course Labs: ordered. Decision-making details documented in ED Course. Radiology: ordered and independent interpretation performed. Decision-making details documented in ED Course.    Details: Do not appreciate obvious intracranial hemorrhage or mass on my review of CT head ECG/medicine tests: ordered and independent interpretation performed.    Details: Does have some prolonged PR, but normal QRS.  Normal QTc.  No ischemic changes.  Appears to be sinus rhythm. Discussion of management or test interpretation with external provider(s): Hospitalist for admission.   Risk Prescription drug management. Decision regarding hospitalization. Diagnosis or treatment significantly limited by social determinants of health.      Final diagnoses:  Hypomagnesemia    ED Discharge Orders     None          Neysa Caron PARAS, DO 09/08/24 2236

## 2024-09-08 NOTE — ED Notes (Signed)
 Patient transported to CT

## 2024-09-08 NOTE — ED Triage Notes (Signed)
 Pt reports increased weakness and spasms/jerking movements to BLE over the last week

## 2024-09-08 NOTE — Plan of Care (Signed)
 Plan of Care Note for accepted transfer   Patient name: Ryan Zhang FMW:[id number] DOB: [birth year]  Facility requesting transfer: [facility] ED Requesting Provider: Dr. Neysa Reason for transfer: Symptomatic hypomagnesemia Facility course: 78 year old male with history of asthma/COPD, bipolar disorder, depression, diabetes, GERD, hypertension, hyperlipidemia, stroke, and other medical comorbidities presented with complaints of bilateral leg weakness/spasms and difficulty ambulating. He has chronic mobility issues and goes to physical therapy.  No back pain.  No bowel or bladder incontinence.  No saddle anesthesia.  No recent trauma.  CT head showing no acute intracranial abnormality.  No fever or leukocytosis, magnesium  1.4, normal CK.  Patient was given IV mag.  Plan of care: The patient is accepted for admission to Telemetry unit at [facility].  [facility] will assume care on arrival to accepting facility. Until arrival, care as per EDP. However, [facility] available 24/7 for questions and assistance.  Check [email or web address] for on-call coverage.  Nursing staff, please call [facility] Admits & Consults System-Wide number under Amion on patient's arrival so appropriate admitting provider can evaluate the pt.

## 2024-09-09 LAB — LACTIC ACID, PLASMA: Lactic Acid, Venous: 0.9 mmol/L (ref 0.5–1.9)

## 2024-09-09 LAB — MAGNESIUM: Magnesium: 1.9 mg/dL (ref 1.7–2.4)

## 2024-09-09 MED ORDER — MAGNESIUM GLYCINATE 120 MG PO CAPS: 1.0000 | ORAL_CAPSULE | Freq: Two times a day (BID) | ORAL | 0 refills | Status: AC

## 2024-09-09 NOTE — Progress Notes (Signed)
 Established Patient Office Visit  Subjective   Patient ID: Ryan Zhang, male    DOB: [birth year]  Age: 78 y.o. MRN: [id number]  Chief Complaint  Patient presents with   UC follow up    HPI   78 Year old male presents today as an urgent care follow-up from 10/7, 10/15 where he presented with phimosis. Patient reports using Nystatin  powder, Diflucan  oral, Lotrisone  cream that help but do not get rid of the rash. He wears adult diapers. He has an appointment with urology on 11/7 for additional management. Does report discomfort with urination. Denies back pain or abdominal pain.   Patient is also requesting to have his labs drawn for his chronic disease management follow-up. He has a history of DM, GERD, HLD, Obesity. Reports being stable.    ROS Past Medical History:  Diagnosis Date   Allergy    allergy shots Dr. Cloretta   Anemia    Anxiety    Asthma    moderate persistant   Bipolar affective (HCC)    Cataract    both eyes   Clotting disorder    Due to blood thinner   COPD (chronic obstructive pulmonary disease) (HCC)    Depression    Diabetes mellitus    DJD (degenerative joint disease)    Dysphagia    Gallstones    hx of, s/p cholecystectomy   GERD (gastroesophageal reflux disease)    Hepatitis A    as teenager 60's   Hollenhorst plaque    right eye   Hyperlipidemia    Hypertension    Kidney stones    hx of   Meniere's disease    Motility disorder, esophageal    Obesity    Pancreatitis    Personal history of colonic polyps 11/2004   hyperplastic.   Stroke [facility])    per MRI    Social History   Socioeconomic History   Marital status: Married    Spouse name: Not on file   Number of children: 1   Years of education: 17   Highest education level: Bachelor's degree (e.g., BA, AB, BS)  Occupational History   Occupation: disabled (due to s/e of ECT)    Employer: DISABLED    Comment: laboratory computing/programming  Tobacco Use   Smoking status: Former     Current packs/day: 0.00    Average packs/day: 1 pack/day for 1 year (1.0 ttl pk-yrs)    Types: Cigarettes    Start date: 11/16/1963    Quit date: 11/15/1964    Years since quitting: 59.8   Smokeless tobacco: Never   Tobacco comments:    Questions not useful  Vaping Use   Vaping status: Never Used  Substance and Sexual Activity   Alcohol use: Not Currently   Drug use: No   Sexual activity: Not Currently    Birth control/protection: Other-see comments    Comment: Age  Other Topics Concern   Not on file  Social History Narrative   - Household-- pt , wife   - adopted  daughter (h/o substance abuse,on a methadone program, doing better )   - disability d/t ECT (2007) - was left w/ cognitive problems    Right handed    Social Drivers of Health   Financial Resource Strain: Low Risk  (09/06/2024)   Overall Financial Resource Strain (CARDIA)    Difficulty of Paying Living Expenses: Not very hard  Food Insecurity: Food Insecurity Present (09/06/2024)   Hunger Vital Sign    Worried  About Running Out of Food in the Last Year: Sometimes true    Ran Out of Food in the Last Year: Sometimes true  Transportation Needs: No Transportation Needs (09/06/2024)   PRAPARE - Administrator, Civil Service (Medical): No    Lack of Transportation (Non-Medical): No  Physical Activity: Insufficiently Active (09/06/2024)   Exercise Vital Sign    Days of Exercise per Week: 4 days    Minutes of Exercise per Session: 20 min  Stress: Stress Concern Present (09/06/2024)   [employer] of Occupational Health - Occupational Stress Questionnaire    Feeling of Stress: To some extent  Social Connections: Moderately Isolated (09/06/2024)   Social Connection and Isolation Panel    Frequency of Communication with Friends and Family: Once a week    Frequency of Social Gatherings with Friends and Family: Never    Attends Religious Services: 1 to 4 times per year    Active Member of [employer] or  Organizations: No    Attends Engineer, Structural: Not on file    Marital Status: Married  Catering Manager Violence: Not At Risk (05/15/2024)   Humiliation, Afraid, Rape, and Kick questionnaire    Fear of Current or Ex-Partner: No    Emotionally Abused: No    Physically Abused: No    Sexually Abused: No    Past Surgical History:  Procedure Laterality Date   CATARACT EXTRACTION Bilateral 09/2015 and 10/2015   CHOLECYSTECTOMY     COLONOSCOPY     EYE SURGERY     JOINT REPLACEMENT     TOTAL KNEE ARTHROPLASTY Bilateral    both knees    Family History  Problem Relation Age of Onset   Arthritis Mother    Cancer Mother    Diabetes Father    Alcohol abuse Father    Cancer Sister        unknown type   Heart disease Brother    Obesity Brother    Obesity Brother    Heart disease Maternal Grandmother    Hearing loss Maternal Grandmother    Alzheimer's disease Paternal Grandmother    Drug abuse Daughter    Colon cancer Neg Hx    Prostate cancer Neg Hx    Colon polyps Neg Hx    Esophageal cancer Neg Hx    Rectal cancer Neg Hx    Stomach cancer Neg Hx     Allergies  Allergen Reactions   Celexa  [Citalopram Hydrobromide] Other (See Comments)   Depakote  [Divalproex Sodium] Other (See Comments)   Methocarbamol     Other reaction(s): Hallucinations   Paroxetine Hcl     Other reaction(s): Insomnia   Smz-Tmp Ds [Sulfamethoxazole-Trimethoprim] Nausea And Vomiting   Other     Symbrax alteration in blood sugar, pt unsure if low or high blood sugar   Oxycodone-Acetaminophen      Other reaction(s): Unknown   Risperidone Other (See Comments)    REACTION: hyper    Current Outpatient Medications on File Prior to Visit  Medication Sig Dispense Refill   albuterol  (PROVENTIL ) (2.5 MG/3ML) 0.083% nebulizer solution TAKE (2.5MG  TOTAL) BY NEBULIZATION EVERY 6 HOURS AS NEEDED FOR WHEEZING OR SHORTNESS OF BREATH 150 mL 0   albuterol  (VENTOLIN  HFA) 108 (90 Base) MCG/ACT  inhaler Inhale 2 puffs into the lungs every 6 (six) hours as needed for wheezing or shortness of breath. 18 g 6   asenapine  (SAPHRIS ) 5 MG SUBL 24 hr tablet Place 5 mg under the tongue at  bedtime.     Blood Glucose Monitoring Suppl (ONETOUCH VERIO REFLECT) w/Device KIT Check blood sugars 1-2 times daily 1 kit 0   budesonide -formoterol (SYMBICORT) 160-4.5 MCG/ACT inhaler Inhale 2 puffs into the lungs 2 (two) times daily.      buPROPion (WELLBUTRIN XL) 150 MG 24 hr tablet Take 150 mg by mouth daily. (Patient taking differently: Take 300 mg by mouth daily.)     carbidopa -levodopa  (SINEMET  IR) 25-100 MG tablet Take 1 tablet by mouth 3 (three) times daily. 8am/noon/4pm 270 tablet 2   cholecalciferol (VITAMIN D3) 25 MCG (1000 UNIT) tablet Take 1,000 Units by mouth daily.     clopidogrel  (PLAVIX ) 75 MG tablet Take 1 tablet (75 mg total) by mouth daily. 90 tablet 1   DULoxetine  (CYMBALTA ) 60 MG capsule Take 60 mg by mouth every morning.     fluconazole  (DIFLUCAN ) 150 MG tablet Day 1: Take 2 tablets daily. Day 2-14: Take 1 tablet daily. 15 tablet 0   fluticasone  (FLONASE ) 50 MCG/ACT nasal spray      lamoTRIgine (LAMICTAL) 25 MG tablet Take 50 mg by mouth at bedtime.     Lancets (ONETOUCH ULTRASOFT) lancets Check blood sugars no more than twice daily 100 each 12   losartan  (COZAAR ) 25 MG tablet Take 1 tablet (25 mg total) by mouth every evening. 90 tablet 1   montelukast (SINGULAIR) 10 MG tablet Take 10 mg by mouth at bedtime. [place]     Multiple Vitamin (MULTIVITAMIN) tablet Take 1 tablet by mouth daily. [employer]ravite senior     naproxen  (NAPROSYN ) 500 MG tablet Take 500 mg by mouth daily as needed.     nystatin  cream (MYCOSTATIN ) Apply to affected area 2 times daily. 30 g 0   ONETOUCH ULTRA test strip USE TO TEST GLUCOSE LEVELS 2 TIMES DAILY 100 strip 0   pantoprazole  (PROTONIX ) 40 MG tablet Take 1 tablet (40 mg total) by mouth daily before breakfast. 90 tablet 1   pioglitazone -metformin  (ACTOPLUS MET )  15-850 MG tablet TAKE ONE TABLET BY MOUTH TWICE DAILY WITH A MEAL 180 tablet 0   simvastatin  (ZOCOR ) 40 MG tablet Take 1 tablet (40 mg total) by mouth at bedtime. 90 tablet 1   solifenacin  (VESICARE ) 10 MG tablet Take 1 tablet (10 mg total) by mouth daily. 90 tablet 3   triamcinolone  cream (KENALOG ) 0.1 % Apply 1 Application topically 2 (two) times daily. 30 g 0   No current facility-administered medications on file prior to visit.    BP 132/82 (BP Location: Left Arm, Patient Position: Sitting, Cuff Size: Large)   Pulse 83   Temp 97.9 F (36.6 C) (Oral)   Resp 20   Ht 5' 10 (1.778 m)   Wt 217 lb 12.8 oz (98.8 kg)   SpO2 95%   BMI 31.25 kg/m chart    Objective:     BP 132/82 (BP Location: Left Arm, Patient Position: Sitting, Cuff Size: Large)   Pulse 83   Temp 97.9 F (36.6 C) (Oral)   Resp 20   Ht 5' 10 (1.778 m)   Wt 217 lb 12.8 oz (98.8 kg)   SpO2 95%   BMI 31.25 kg/m    Physical Exam   Results for orders placed or performed in visit on 09/07/24  CBC w/Diff  Result Value Ref Range   WBC 10.8 (H) 4.0 - 10.5 K/uL   RBC 4.44 4.22 - 5.81 Mil/uL   Hemoglobin 11.1 (L) 13.0 - 17.0 g/dL   HCT 64.7 (L) 60.9 -  52.0 %   MCV 79.4 78.0 - 100.0 fl   MCHC 31.4 30.0 - 36.0 g/dL   RDW 82.1 (H) 88.4 - 84.4 %   Platelets 386.0 150.0 - 400.0 K/uL   Neutrophils Relative % 64.0 43.0 - 77.0 %   Lymphocytes Relative 24.7 12.0 - 46.0 %   Monocytes Relative 8.9 3.0 - 12.0 %   Eosinophils Relative 2.0 0.0 - 5.0 %   Basophils Relative 0.4 0.0 - 3.0 %   Neutro Abs 6.9 1.4 - 7.7 K/uL   Lymphs Abs 2.7 0.7 - 4.0 K/uL   Monocytes Absolute 1.0 0.1 - 1.0 K/uL   Eosinophils Absolute 0.2 0.0 - 0.7 K/uL   Basophils Absolute 0.0 0.0 - 0.1 K/uL  HgB A1c  Result Value Ref Range   Hgb A1c MFr Bld 7.2 (H) 4.6 - 6.5 %  Basic Metabolic Panel (BMET)  Result Value Ref Range   Sodium 140 135 - 145 mEq/L   Potassium 5.1 3.5 - 5.1 mEq/L   Chloride 103 96 - 112 mEq/L   CO2 27 19 - 32 mEq/L    Glucose, Bld 144 (H) 70 - 99 mg/dL   BUN 25 (H) 6 - 23 mg/dL   Creatinine, Ser 9.02 0.40 - 1.50 mg/dL   GFR 24.03 >39.99 mL/min   Calcium 9.4 8.4 - 10.5 mg/dL  Lipid panel  Result Value Ref Range   Cholesterol 191 0 - 200 mg/dL   Triglycerides 03.9 0.0 - 149.0 mg/dL   HDL 38.29 >60.99 mg/dL   VLDL 80.7 0.0 - 59.9 mg/dL   LDL Cholesterol 889 (H) 0 - 99 mg/dL   Total CHOL/HDL Ratio 3    NonHDL 129.19       The ASCVD Risk score (Arnett DK, et al., 2019) failed to calculate for the following reasons:   Risk score cannot be calculated because patient has a medical history suggesting prior/existing ASCVD    Assessment & Plan:   Problem List Items Addressed This Visit     PHIMOSIS - Primary   Relevant Orders   POC Urinalysis Dipstick   CBC w/Diff (Completed)   HgB A1c (Completed)   Basic Metabolic Panel (BMET) (Completed)   Lipid panel (Completed)   Morbid obesity (HCC)   Relevant Orders   CBC w/Diff (Completed)   HgB A1c (Completed)   Basic Metabolic Panel (BMET) (Completed)   Lipid panel (Completed)   Hyperlipidemia associated with type 2 diabetes mellitus (HCC)   Relevant Orders   CBC w/Diff (Completed)   HgB A1c (Completed)   Basic Metabolic Panel (BMET) (Completed)   Lipid panel (Completed)   GERD   Relevant Orders   CBC w/Diff (Completed)   HgB A1c (Completed)   Basic Metabolic Panel (BMET) (Completed)   Lipid panel (Completed)   DM type 2 with diabetic peripheral neuropathy (HCC)   Relevant Orders   CBC w/Diff (Completed)   HgB A1c (Completed)   Basic Metabolic Panel (BMET) (Completed)   Lipid panel (Completed)   Other Visit Diagnoses       Dysuria       Relevant Orders   POC Urinalysis Dipstick   CBC w/Diff (Completed)   HgB A1c (Completed)   Basic Metabolic Panel (BMET) (Completed)   Lipid panel (Completed)       No follow-ups on file.   Follow-up with urology as scheduled. Patient was unable to obtain a urine today. Will treat with Cipro  BID  x 7 days. Continue Diflucan . Call the office if symptoms  worsen or persist. Recheck pending labs, and sooner as needed.   Mohit Zirbes B Finn Altemose, FNP

## 2024-09-09 NOTE — ED Provider Notes (Signed)
 I assumed care of this patient from previous provider.  Please see their note for further details of history, exam, and MDM.   Briefly patient is a 78 y.o. male who presented with weakness and gait instability he noted to have low magnesium  level at 1.4.  Patient was awaiting admission to the hospital for further evaluation and treatment.  Patient and family inquired about reassessment in the morning after IV repletion to determine if patient is able to go home.  Since there was prolonged inpatient bed wait time, I reevaluated patient and repeated the magnesium  level which normalized at 1.9.  Patient was able to ambulate at baseline with his walker, briskly, around the entire emergency department.  The patient and family are comfortable with him going home with additional oral repletion.  I think that this is reasonable and recommended to follow-up with his primary care provider in 2 to 3 weeks for repeat magnesium  level check.  I reached out to Dr. Alfornia from the hospitalist service to inform them about the change in disposition.  The patient appears reasonably screened and/or stabilized for discharge and I doubt any other medical condition or other [facility] requiring further screening, evaluation, or treatment in the ED at this time. I have discussed the findings, Dx and Tx plan with the patient/family who expressed understanding and agree(s) with the plan. Discharge instructions discussed at length. The patient/family was given strict return precautions who verbalized understanding of the instructions. No further questions at time of discharge.  Disposition: Discharge  Condition: Good  ED Discharge Orders          Ordered    Magnesium  Glycinate 120 MG CAPS  2 times daily        09/09/24 0433             Follow Up: Amon Aloysius BRAVO, MD [address] [place] [address] [phone]  Call  to schedule an appointment for close follow up in 2-3 weeks for magnesium  level  recheck         Macy Lingenfelter, Raynell Moder, MD 09/09/24 [phone]

## 2024-09-10 LAB — LAMOTRIGINE LEVEL: Lamotrigine Lvl: 4 ug/mL (ref 2.0–20.0)

## 2024-09-11 ENCOUNTER — Telehealth: Payer: Self-pay | Admitting: Neurology

## 2024-09-11 NOTE — Telephone Encounter (Signed)
 Pt and his wife called in and stated that he went to Emergency Room last Saturday and Pt Magnesium  was low so he got a prescription for that. Pt stated that he has no control over his muscles in his legs and has no balance. Pt is frustrated at this time. He feels that his illness is getting worse. Please call. Thanks

## 2024-09-11 NOTE — Telephone Encounter (Signed)
 When reviewing the notes from ED they did detect Magnesium  deficiency. They also made note that patient needs to see PCP and that patient needs to talk to psychiatry as his symptoms are coming from the Saphris  he is taking for his Bipolar

## 2024-09-11 NOTE — Telephone Encounter (Signed)
 Who's calling (name and relationship to patient) : [name]; pt   Best contact number: [phone]  Provider they see: Dr. Evonnie  Reason for call: Ryan Zhang called in stating that he was seen in the ER on Saturday. With Dx(wasn't able to make out). He stated he is wanting to know what to do next.

## 2024-09-11 NOTE — Telephone Encounter (Signed)
 Called and spoke to patient . Told him to call psyc and PCP to see why levels were so low and see if psyc can help. When hanging up patients phone said this call is being recorded

## 2024-09-12 ENCOUNTER — Ambulatory Visit

## 2024-09-12 DIAGNOSIS — J3089 Other allergic rhinitis: Secondary | ICD-10-CM | POA: Diagnosis not present

## 2024-09-12 DIAGNOSIS — J3081 Allergic rhinitis due to animal (cat) (dog) hair and dander: Secondary | ICD-10-CM | POA: Diagnosis not present

## 2024-09-12 DIAGNOSIS — J301 Allergic rhinitis due to pollen: Secondary | ICD-10-CM | POA: Diagnosis not present

## 2024-09-14 ENCOUNTER — Ambulatory Visit: Admitting: Internal Medicine

## 2024-09-18 ENCOUNTER — Ambulatory Visit: Attending: Internal Medicine

## 2024-09-18 DIAGNOSIS — R2689 Other abnormalities of gait and mobility: Secondary | ICD-10-CM | POA: Diagnosis present

## 2024-09-18 DIAGNOSIS — Z9181 History of falling: Secondary | ICD-10-CM | POA: Diagnosis present

## 2024-09-18 DIAGNOSIS — M6281 Muscle weakness (generalized): Secondary | ICD-10-CM | POA: Diagnosis present

## 2024-09-18 DIAGNOSIS — R2681 Unsteadiness on feet: Secondary | ICD-10-CM | POA: Diagnosis present

## 2024-09-18 NOTE — Therapy (Signed)
 OUTPATIENT PHYSICAL THERAPY TREATMENT     Patient Name: Ryan Zhang MRN: [id number] DOB:01-31-1946, 78 y.o., male Today's Date: 09/18/2024   END OF SESSION:  PT End of Session - 09/18/24 1151     Visit Number 13    Date for Recertification  11/01/24    Authorization Type [employer]    Authorization Time Period 09/06/24- 10/04/24    Authorization - Visit Number 2    Authorization - Number of Visits 8    Progress Note Due on Visit 22   Recert/PN on visit #12 (09/06/24)   PT Start Time 1103    PT Stop Time 1147    PT Time Calculation (min) 44 min    Activity Tolerance Patient limited by fatigue    Behavior During Therapy WFL for tasks assessed/performed              Past Medical History:  Diagnosis Date   Allergy    allergy shots Dr. Cloretta   Anemia    Anxiety    Asthma    moderate persistant   Bipolar affective (HCC)    Cataract    both eyes   Clotting disorder    Due to blood thinner   COPD (chronic obstructive pulmonary disease) (HCC)    Depression    Diabetes mellitus    DJD (degenerative joint disease)    Dysphagia    Gallstones    hx of, s/p cholecystectomy   GERD (gastroesophageal reflux disease)    Hepatitis A    as teenager 60's   Hollenhorst plaque    right eye   Hyperlipidemia    Hypertension    Kidney stones    hx of   Meniere's disease    Motility disorder, esophageal    Obesity    Pancreatitis    Personal history of colonic polyps 11/2004   hyperplastic.   Stroke [facility])    per MRI   Past Surgical History:  Procedure Laterality Date   CATARACT EXTRACTION Bilateral 09/2015 and 10/2015   CHOLECYSTECTOMY     COLONOSCOPY     EYE SURGERY     JOINT REPLACEMENT     TOTAL KNEE ARTHROPLASTY Bilateral    both knees   Patient Active Problem List   Diagnosis Date Noted   Hypomagnesemia 09/08/2024   Tinea cruris 07/24/2020   PCP NOTES >>>>>>>>>>>>>>>>>>>>>>>>>>>>>>> 01/12/2016   Folliculitis 05/09/2015   Hearing loss in right ear  08/05/2014   Panic attack as reaction to stress 04/20/2012   Dysphagia, neurologic 01/07/2012   Morbid obesity (HCC) 01/07/2012   Chest pain, musculoskeletal 12/07/2011   Candidiasis of skin 08/12/2011   Annual physical exam 06/07/2011   OLECRANON BURSITIS 01/29/2011   PARTIAL ARTERIAL OCCLUSION OF RETINA 05/27/2010   Essential hypertension, benign 01/16/2010   ABNORMAL ELECTROCARDIOGRAM 01/14/2010   Asthma 10/14/2009   GERD 10/14/2009   Bipolar disorder (HCC) 02/20/2009   PHIMOSIS 02/20/2009   KNEE PAIN, CHRONIC 11/20/2008   HIP PAIN, RIGHT 08/19/2008   DM type 2 with diabetic peripheral neuropathy (HCC) 02/22/2007   Hyperlipidemia associated with type 2 diabetes mellitus (HCC) 02/22/2007   ALLERGIC RHINITIS, SEASONAL 02/22/2007   LOW BACK PAIN, CHRONIC 02/22/2007   MENIERE'S DISEASE, HX OF 02/22/2007    PCP: Amon Aloysius BRAVO, MD   REFERRING PROVIDER: Amon Aloysius BRAVO, MD  (Neurologist - Evonnie Stabs, MD)  REFERRING DIAG:  [phone] (ICD-10-CM) - Neuroleptic-induced parkinsonism (HCC)  R26.9 (ICD-10-CM) - Gait disorder  R29.6 (ICD-10-CM) - Multiple falls   THERAPY  DIAG:  Muscle weakness (generalized)  Unsteadiness on feet  Other abnormalities of gait and mobility  History of falling  RATIONALE FOR EVALUATION AND TREATMENT: Rehabilitation  ONSET DATE: ~2013 - 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 He notes he fell yesterday, stooped down and came back up and fell.  Pt accompanied by:  significant other (spouse in waiting room)  PAIN: Are you having pain? Yes: NPRS scale: 5/10  Pain location: B LE  Pain description: aching  Aggravating factors: walking or moving around  Relieving factors: rest   PERTINENT HISTORY:  Anxiety, asthma, bipolar affective disorder, COPD, depression, DM-II, DJD, chronic knee pain, B TKA, GERD, HTN, Mnire's disease, obesity, pancreatitis, h/o CVA, neurologic dysphagia, tardive dyskinesia, HOH - L deaf & R hearing aid  PRECAUTIONS: Fall  RED FLAGS: None  WEIGHT BEARING RESTRICTIONS: No  FALLS:  Has patient fallen in last 6 months? No and before the levodopa  supplements, falls were daily (sometimes several times a day)  LIVING ENVIRONMENT: Lives with: lives with their spouse and dog Lives in: Apartment Stairs: No Has following equipment at home: Single point cane, Environmental Consultant - 2 wheeled, shower chair, and Grab bars (platform RW)  OCCUPATION: Retired  PLOF: Independent, Independent with community mobility with device, and Leisure: watching TV, listening to books, programming, walking ~15 min (around the block) - tries to do daily   PATIENT GOALS: To get back to the point where I can walk to the car unassisted (no AD) and use platform RW less in community.  Be able to assist my wife with bringing groceries in from the car.   OBJECTIVE: (objective measures completed at initial evaluation unless otherwise dated)  DIAGNOSTIC FINDINGS:  N/A - Multiple imaging studies in 2024 s/p falls but no acute fractures.  COGNITION: Overall cognitive status: Impaired and History of cognitive impairments - at baseline - mostly memory issues resulting from ECT 19 yrs ago, better over last 3 months but still notes limited STM   SENSATION: WFL  COORDINATION: B Heel/toe WFL Heel to  shin unable bilaterally   POSTURE:  rounded shoulders, forward head, and flexed trunk   MUSCLE LENGTH: Hamstrings: mod/severe tight B Piriformis: mild /mod tight  R>L Hip flexors: mod tight   LOWER EXTREMITY ROM:    Grossly WFL except limited hip extension and rotation bilaterally  LOWER EXTREMITY MMT:    MMT Right eval Left eval R 09/06/24 L 09/06/24  Hip flexion 3+ 3+ 3+ 3+  Hip extension 3+ 3+ 3+ 3+  Hip abduction 3- 3 3- 3-  Hip adduction 3+ 3+ 3- 3-  Hip internal rotation 4- 3+ 4- 4-  Hip external rotation 4- 3+ 4- 4-  Knee flexion 4 4 4- 4  Knee extension 4 4 4- 4  Ankle dorsiflexion 4- 4- 4- 4  Ankle plantarflexion      Ankle inversion      Ankle eversion      (Blank rows = not tested)  BED MOBILITY:  SBA  TRANSFERS: Assistive device utilized: None  Sit to stand: Modified independence and SBA Stand to sit: Modified independence and SBA Chair to chair: SBA Floor: NT  GAIT: Distance walked: clinic distances Assistive device utilized: Single point cane, platform RW/2WW, and None Level of assistance: SBA Gait pattern: step through pattern, decreased arm swing- Right, decreased arm swing- Left, decreased stride length, decreased hip/knee flexion- Right, decreased hip/knee flexion- Left, decreased ankle dorsiflexion- Right, decreased ankle dorsiflexion- Left, shuffling, festinating, trunk flexed, poor foot clearance- Right, and poor foot clearance- Left Comments: gait deviations more pronounced with decreasing AD support  FUNCTIONAL TESTS:  5 times sit to stand: unable w/o UE assist - only able to complete 4 reps in 34.03 sec; 20.18 sec with B UE assist Timed up and go (TUG): Normal - 15.84 sec w/o AD, Manual - 15.19 sec w/o AD, Cognitive - 15.66 sec w/o AD 10 meter walk test: 18.25 sec w/o AD, 16.56 sec with SPC, 14.78 sec with platform RW Gait speed: 1.80 ft/sec w/o AD, 1.98 ft/sec with SPC, 2.22 ft/sec with platform RW Berg balance scale: 41/56, 37-45 indicates significant (>80%) risk for falls (06/26/24) DGI: 12/24, <19/24 indicates high risk for falls (06/26/24)  PATIENT SURVEYS:  ABC scale: 620 / 1600 = 38.8 %, indicating  a low level of physical functioning (<69% indicates risk for recurrent falls in PD) 09/06/24 - ABC scale: 09/06/24 - 160 / 1600 = 10.0 %   TODAY'S TREATMENT:  09/18/24 THERAPEUTIC EXERCISE: To improve strength and endurance.  Demonstration, verbal and tactile cues throughout for technique. NuStep - L5 x 6 min (LE only) NEUROMUSCULAR RE-EDUCATION:  Seated on dynadisk hands on table for support: Sitting balance LAQ x 5 BLE- very difficult Perturbations 4 ways Marching x 5 BLE  Gait training:  270 with roller walker - some R foot drag, very short steps B  Sit to stands with chair in front very challenging 3x lots of effort standing even with support  09/06/2024 - Recertification THERAPEUTIC ACTIVITIES: To improve functional performance.  Demonstration, verbal and tactile cues throughout for technique. LE MMT 5xSTS = 32.06 sec with B UE assist TUG = 40.50 sec with platform 4WW ABC Scale: 09/06/24 - 160 / 1600 = 10.0 % = 28.63 sec with platform 4WW Gait speed = 1.15 ft/sec with platform 4WW Goal assessment   08/16/2024  THERAPEUTIC EXERCISE: To improve strength and endurance.  Demonstration, verbal and tactile cues throughout for technique. NuStep - L5 x 6 min (LE only)  THERAPEUTIC ACTIVITIES: To improve functional performance.  Demonstration, verbal  and tactile cues throughout for technique. with upright/platform 4WW = 15.16 sec (normal speed), 13.84 sec (fastest comfortable speed) Gait speed with upright/platform 4WW = 2.16 ft/sec (normal speed), 2.37 ft/sec (fastest comfortable speed)  SELF CARE:  Provided education on community resources related to Parkinson's including support group and educational sessions, community-based PWR! Moves and exercise/movement groups as well as online resources related to PD.   NEUROMUSCULAR RE-EDUCATION: To improve balance, coordination, kinesthesia, posture, proprioception, reduce fall risk, amplitude of movement, speed of movement  to reduce bradykinesia, and reduce rigidity.  Standing between backs of 2 chair for UE support with SBA of PT: PWR! Step forward x 10 bil - min A x 1 for posterior LOB PWR! Step backward x 10 bil PWR! Step through forward & backward x 10 bil  GAIT TRAINING: To normalize gait pattern and prepare to wean to LRAD. 40 ft with B hiking poles and CGA of PT via gait belt - cues for reciprocal sequencing with pt having difficulty achieving normal step pattern   08/13/24 Nustep L5x38min LE only ABC scale: 810 / 1600 = 50.6 % TUG = 21.01 sec with platform 4WW Completed BERG & DGI -- see below  Berg Balance Test   Sit to Stand Able to stand  independently using hands    Standing Unsupported Able to stand safely 2 minutes    Sitting with Back Unsupported but Feet Supported on Floor or Stool Able to sit safely and securely 2 minutes    Stand to Sit Sits safely with minimal use of hands    Transfers Able to transfer safely, minor use of hands    Standing Unsupported with Eyes Closed Able to stand 10 seconds safely    Standing Unsupported with Feet Together Able to place feet together independently and stand 1 minute safely    From Standing, Reach Forward with Outstretched Arm Can reach confidently >25 cm (10)    From Standing Position, Pick up Object from Floor Able to pick up shoe safely and easily    From Standing Position, Turn to Look Behind Over each Shoulder Looks behind from both sides and weight shifts well    Turn 360 Degrees Able to turn 360 degrees safely one side only in 4 seconds or less    Standing Unsupported, Alternately Place Feet on Step/Stool Able to stand independently and complete 8 steps >20 seconds    Standing Unsupported, One Foot in Front Able to take small step independently and hold 30 seconds    Standing on One Leg Unable to try or needs assist to prevent fall    Total Score 47      Dynamic Gait Index   Level Surface Moderate Impairment    Change in Gait Speed Mild  Impairment    Gait with Horizontal Head Turns Mild Impairment    Gait with Vertical Head Turns Mild Impairment    Gait and Pivot Turn Normal    Step Over Obstacle Mild Impairment    Step Around Obstacles Normal    Steps Moderate Impairment    Total Score 16       08/09/2024 THERAPEUTIC EXERCISE: To improve strength and endurance.   NuStep - L4 x 8 min (LE only) Gait around clinic with 270'  NEUROMUSCULAR RE-EDUCATION:  With UE support: Standing heel/toe lift x 10 B Standing hip abduction x 10 B Standing marching x 10 B Standing balance x 1 min; added EC 3x10 Sidesteps 3x down and back  PWR twist x 10  08/06/2024 THERAPEUTIC EXERCISE: To improve strength and endurance.   NuStep - L4 x 8 min (LE only)  THERAPEUTIC ACTIVITIES: To improve functional performance.  Demonstration, verbal and tactile cues throughout for technique. TUG = 19.88 sec with platform 4WW (pt misjudged seat and attempted to sit on armrest of chair), 23.06 sec w/o AD 5xSTS = 20.50 sec with B UE assist, unable w/o UE assist  NEUROMUSCULAR RE-EDUCATION: To improve balance, coordination, kinesthesia, posture, proprioception, reduce fall risk, amplitude of movement, speed of movement to reduce bradykinesia, and reduce rigidity. Seated PWR! Moves: Up x 10 Rock x 10 Twist x 10 Step x 10  PWR! Sit to stand x 7 from Airex pad in chair Standing PWR! Moves (modified with chair in front for intermittent UE support as needed): Up x 10 Rock x 10 - one hand remaining on chair Twist x 10 - one hand remaining on chair Step x 8 - one hand remaining on chair   07/24/2024  THERAPEUTIC EXERCISE: To improve strength, endurance, ROM, and flexibility.  Demonstration, verbal and tactile cues throughout for technique.   - resistance reduced d/t c/o fatigue Gait 270 ft around clinic with walker   NEUROMUSCULAR RE-EDUCATION: To improve coordination, kinesthesia, posture, proprioception Seated:  PWR! Up 2x10  PWR Rock 2x10  each direction  PWR Twist 2x10 each direction  PWR step x 5 each direction very difficult  Sit to stand x 5   07/19/2024  THERAPEUTIC EXERCISE: To improve strength, endurance, ROM, and flexibility.  Demonstration, verbal and tactile cues throughout for technique.  NuStep - L3 x 8 min (LE only) - resistance reduced d/t c/o fatigue Seated marching 2lb 2x10 BLE Seated LAQ 2lb 2x10 BLE Seated hamstring stretch x 30 B Seated piriformis stretch fig 4 x 30 B Gait 90 ft around clinic with walker   NEUROMUSCULAR RE-EDUCATION: To improve coordination, kinesthesia, posture, proprioception Seated diagonals with RTB x 10 B Seated rows GTB 2x10 Seated shoulder ext GTB 2x10 Sit to stands x 5 Standing ant/post WS in staggered stance x 10 B with walker   07/12/2024  THERAPEUTIC EXERCISE: To improve strength, endurance, ROM, and flexibility.  Demonstration, verbal and tactile cues throughout for technique.  NuStep - L3 x 6 min (LE only) - resistance reduced d/t c/o fatigue Seated hip hinge HS stretch 3 x 30 bil Seated hip hinge figure-4 piriformis stretch 3 x 30 bil Seated hip flexor stretch in lunge position over edge of seat 3 x 30 bil  NEUROMUSCULAR RE-EDUCATION: To improve coordination, kinesthesia, posture, proprioception, reduce fall risk, amplitude of movement, speed of movement to reduce bradykinesia, and reduce rigidity. Seated PWR! Moves: Up x 10 Rock x 5 bil Twist - deferred d/t increased shoulder pain Step x 5 bil Seated scapular retraction into pool noodle along spine and back of chair 10 x 3 Seated GTB scapular retraction + B shoulder row 10 x 3 Seated GTB scapular retraction + B shoulder extension 10 x 3    PATIENT EDUCATION:  Education details: HEP update - proximal LE stretches, transfer safety, gait safety with platform rollator, and Check for Safety - Home Fall Prevention Checklist for Older Adults   Person educated: Patient Education method: Explanation,  Demonstration, Verbal cues, Handouts, and MedBridgeGO app access provided Education comprehension: verbalized understanding, returned demonstration, verbal cues required, and needs further education  HOME EXERCISE PROGRAM: Access Code: [email or web address] URL: [email or web address] Date: 07/12/2024 Prepared by: Elijah Hidden  Exercises - Seated Hip Abduction with Resistance  - 1 x daily -  3 x weekly - 3 sets - 10 reps - Seated Hip Adduction Isometrics with Ball  - 1 x daily - 3 x weekly - 3 sets - 10 reps - Seated March with Resistance  - 1 x daily - 3 x weekly - 3 sets - 10 reps - Sit to Stand  - 1 x daily - 3 x weekly - 1 sets - 5 reps - Seated Hamstring Stretch  - 1-2 x daily - 7 x weekly - 3 reps - 30 sec hold - Seated Figure 4 Piriformis Stretch  - 1-2 x daily - 7 x weekly - 3 reps - 30 sec hold - Seated Hip Flexor Stretch  - 1-2 x daily - 7 x weekly - 3 reps - 30 sec hold  Patient Education - Check for Safety  PWR Moves: - Sitting - Standing (modified)   ASSESSMENT:  CLINICAL IMPRESSION: Pt arrives today after being in ED 09/08/24 noting he could not control his legs, they found hypomagnesia and he was put on medications for this. Today presented with decline in functional strength as seen with more difficulty performing STS transfers. He was very challenged with the exercises seated on dynadisk requiring moderate assistance to avoid falling over. Will plan for BERG assessment again next visit.  OBJECTIVE IMPAIRMENTS: Abnormal gait, decreased activity tolerance, decreased balance, decreased coordination, decreased endurance, decreased knowledge of condition, decreased knowledge of use of DME, decreased mobility, difficulty walking, decreased strength, decreased safety awareness, dizziness, impaired perceived functional ability, impaired flexibility, improper body mechanics, postural dysfunction, and pain.   ACTIVITY LIMITATIONS: carrying, lifting, bending, standing,  squatting, stairs, transfers, bed mobility, reach over head, locomotion level, and caring for others  PARTICIPATION LIMITATIONS: meal prep, cleaning, laundry, shopping, and community activity  PERSONAL FACTORS: Age, Fitness, Past/current experiences, Time since onset of injury/illness/exacerbation, and 3+ comorbidities: Anxiety, asthma, bipolar affective disorder, COPD, depression, DM-II, DJD, chronic knee pain, B TKA, GERD, HTN, Mnire's disease, obesity, pancreatitis, h/o CVA, neurologic dysphagia, tardive dyskinesia, HOH - L deaf & R hearing aid are also affecting patient's functional outcome.   REHAB POTENTIAL: Good  CLINICAL DECISION MAKING: Unstable/unpredictable  EVALUATION COMPLEXITY: High   GOALS: Goals reviewed with patient? Yes  SHORT TERM GOALS: Target date: 07/30/2024, extended to 10/04/2024  Patient will be independent with initial HEP. Baseline: To be established 07/05/24 - consistent with current HEP  07/12/24 - clarified HEP frequency Goal status: MET - 08/06/24   2.  Patient will demonstrate decreased fall risk by scoring < 13.5 sec on normal TUG. Baseline: 15.84 sec w/o AD 08/06/24 - 19.88 sec with platform 4WW (pt misjudged seat and attempted to sit on armrest of chair), 23.06 sec w/o AD 08/13/24 - 21.01 sec with platform 4WW Goal status: IN PROGRESS - 09/05/24 - 40.50 sec with platform 4WW  3.  Patient will be educated on strategies to decrease risk of falls.  Baseline:  Goal status: MET - 07/09/24   LONG TERM GOALS: Target date: 09/10/2024, extended to 11/01/2024  Patient will be independent with ongoing/advanced HEP for self-management at home incorporating PWR! Moves as indicated .  Baseline:  Goal status: IN PROGRESS - 08/13/24 - met for current HEP   2.  Patient will be able to ambulate 600' with LRAD with good safety to access community.  Baseline:  Goal status: IN PROGRESS - 08/09/24 - 270' with standing 4WW  3.  Patient will be able to step up/down  curb safely with LRAD for safety with community ambulation.  Baseline:  Goal status: IN PROGRESS - 08/16/24 - pt reports he does not attempt curbs with platform 4WW as walker too heavy to navigate up curb   4.  Patient will demonstrate gait speed of >/= 2.62 ft/sec (0.8 m/s) to be a safe limited community ambulator with decreased risk for recurrent falls.  Baseline: 1.80 ft/sec w/o AD, 1.98 ft/sec with SPC and 2.22 ft/sec with platform RW 07/19/24 - 2.26 ft/sec 08/16/24 - 2.16 ft/sec (normal speed), 2.37 ft/sec (fastest comfortable speed) with upright/platform 4WW Goal status: IN PROGRESS - 09/06/24 - 1.15 ft/sec with platform 4WW  5.  Patient will improve 5x STS time to </= 15 seconds w/o need for UE assist to demonstrate improved functional strength and transfer efficiency. Baseline: unable w/o UE assist - only able to complete 4 reps in 34.03 sec; 20.18 sec with B UE assist 08/06/24 - 20.50 sec with B UE assist Goal status: IN PROGRESS - 09/06/24 - 32.06 sec with B UE assist  6.  Patient will demonstrate at least 19/24 on DGI to improve gait stability and reduce risk for falls. Baseline: 12/24 (06/26/24) Goal status: IN PROGRESS - 08/13/24 - 16/24  7.  Patient will improve Berg score by at least 8 points to improve safety and stability with ADLs in standing and reduce risk for falls. (MCID = 8 points)   Baseline: 41/56 (06/26/24) Goal status: IN PROGRESS - 08/13/24 - 47/56  8.  Patient will report >/= 52% on ABC scale to demonstrate improved balance confidence and decreased risk for falls. Baseline: 620 / 1600 = 38.8 % 08/13/24 - 810 / 1600 = 50.6 % Goal status: IN PROGRESS - 09/06/24 - 160 / 1600 = 10.0 %  9. Patient will verbalize understanding of local Parkinson's disease community resources, including community fitness post d/c. Baseline:  Goal status: IN PROGRESS - 08/16/24 - handouts provided   PLAN:  PT FREQUENCY: 2x/week  PT DURATION: 12 weeks  PLANNED INTERVENTIONS: [id number]- PT  Re-evaluation, 97750- Physical Performance Testing, 97110-Therapeutic exercises, 97530- Therapeutic activity, [id number]- Neuromuscular re-education, 97535- Self Care, [id number]- Manual therapy, [id number]- Gait training, [phone]- Canalith repositioning, [id number]- Electrical stimulation (unattended), 97035- Ultrasound, 02966- Ionotophoresis 4mg /ml Dexamethasone , 79439 (1-2 muscles), 20561 (3+ muscles)- Dry Needling, Patient/Family education, Balance training, Stair training, Taping, Joint mobilization, Vestibular training, DME instructions, Cryotherapy, and Moist heat  PLAN FOR NEXT SESSION: continue LE strengthening; balance activities (both static and dynamic); PWR! Moves    Sol LITTIE Gaskins, PTA  09/18/2024, 11:56 AM       Date of referral: 05/31/24 Referring provider: Amon Aloysius BRAVO, MD Referring diagnosis?  G21.11,T43.505A (ICD-10-CM) - Neuroleptic-induced parkinsonism (HCC)  R26.9 (ICD-10-CM) - Gait disorder  R29.6 (ICD-10-CM) - Multiple falls   Treatment diagnosis? (if different than referring diagnosis)  Muscle weakness (generalized)  Unsteadiness on feet  Other abnormalities of gait and mobility  History of falling  What was this (referring dx) caused by? Ongoing Issue and Other: neurodegenerative disease  Nature of Condition: Chronic (continuous duration > 3 months)   Laterality: Both  Current Functional Measure Score: Other ABC Scale: 810 / 1600 = 50.6 %, indicating a moderate level of physical functioning (<69% indicates risk for recurrent falls in PD)    Objective measurements identify impairments when they are compared to normal values, the uninvolved extremity, and prior level of function.  [x]  Yes  []  No  Objective assessment of functional ability: Moderate functional limitations   Briefly describe symptoms:  Orion is progressing with PT demonstrating improving tolerance for  standing activities, improving gait safety and speed with his platform 4WW, and decreasing fall risk per  improvements in Berg from 41/56 to 47/56 (indicating a reduction in fall risk from significant (greater than 80%) to moderate moderate (>50%) and DGI from 12/24 to 16/24 (scores of <19/24 still indicating high risk for falls) today.  He has not had any falls in the past month.  Pinchos will benefit from continued skilled PT to address ongoing motor control, strength and balance deficits to improve mobility and activity tolerance with decreased pain interference and decreased risk for falls.   How did symptoms start: gradual onset  Average pain intensity:  Last 24 hours: 2/10  Past week: 2/10  How often does the pt experience symptoms? Frequently  How much have the symptoms interfered with usual daily activities? Moderately  How has condition changed since care began at this facility? A little better   In general, how is the patients overall health? Fair  Onset date: Parkinsonism diagnosis in 2013, worsening weakness over the past 6 months    BACK PAIN (STarT Back Screening Tool) - (When applicable):   Has your back pain spread down your leg(s) at sometime in the last 2 weeks? []  Yes   [x]  No Have you had pain in the shoulder or neck at sometime in the past 2 weeks? [x]  Yes   []  No Have you only walked short distances because of your back pain? []  Yes   [x]  No In the past 2 weeks, have you dressed more slowly than usual because of your back pain? []  Yes   [x]  No Do you think it is not really safe for person with a condition like yours to be physically active? []  Yes   [x]  No Have worrying thoughts been going through your mind a lot of the time? [x]  Yes   []  No Do you feel that your back pain is terrible and it is never going to get any better? []  Yes   [x]  No In general, have you stopped enjoying all the things you usually enjoy? [x]  Yes   []  No Overall, how bothersome has your back pain been in the last 2 weeks? []  Not at all   [x]  Slightly     []  Moderate   []  Very much     []   Extremely

## 2024-09-19 ENCOUNTER — Ambulatory Visit

## 2024-09-19 DIAGNOSIS — M6281 Muscle weakness (generalized): Secondary | ICD-10-CM

## 2024-09-19 DIAGNOSIS — R2689 Other abnormalities of gait and mobility: Secondary | ICD-10-CM

## 2024-09-19 DIAGNOSIS — R2681 Unsteadiness on feet: Secondary | ICD-10-CM

## 2024-09-19 DIAGNOSIS — Z9181 History of falling: Secondary | ICD-10-CM

## 2024-09-19 NOTE — Therapy (Signed)
 OUTPATIENT PHYSICAL THERAPY TREATMENT     Patient Name: Ryan Zhang MRN: [id number] DOB:Aug 09, 1946, 78 y.o., male Today's Date: 09/19/2024   END OF SESSION:  PT End of Session - 09/19/24 1152     Visit Number 14    Date for Recertification  11/01/24    Authorization Type [employer]    Authorization Time Period 09/06/24- 10/04/24    Authorization - Visit Number 3    Authorization - Number of Visits 8    Progress Note Due on Visit 22   Recert/PN on visit #12 (09/06/24)   PT Start Time 1104    PT Stop Time 1147    PT Time Calculation (min) 43 min    Activity Tolerance Patient limited by fatigue    Behavior During Therapy WFL for tasks assessed/performed               Past Medical History:  Diagnosis Date   Allergy    allergy shots Dr. Cloretta   Anemia    Anxiety    Asthma    moderate persistant   Bipolar affective (HCC)    Cataract    both eyes   Clotting disorder    Due to blood thinner   COPD (chronic obstructive pulmonary disease) (HCC)    Depression    Diabetes mellitus    DJD (degenerative joint disease)    Dysphagia    Gallstones    hx of, s/p cholecystectomy   GERD (gastroesophageal reflux disease)    Hepatitis A    as teenager 60's   Hollenhorst plaque    right eye   Hyperlipidemia    Hypertension    Kidney stones    hx of   Meniere's disease    Motility disorder, esophageal    Obesity    Pancreatitis    Personal history of colonic polyps 11/2004   hyperplastic.   Stroke [facility])    per MRI   Past Surgical History:  Procedure Laterality Date   CATARACT EXTRACTION Bilateral 09/2015 and 10/2015   CHOLECYSTECTOMY     COLONOSCOPY     EYE SURGERY     JOINT REPLACEMENT     TOTAL KNEE ARTHROPLASTY Bilateral    both knees   Patient Active Problem List   Diagnosis Date Noted   Hypomagnesemia 09/08/2024   Tinea cruris 07/24/2020   PCP NOTES >>>>>>>>>>>>>>>>>>>>>>>>>>>>>>> 01/12/2016   Folliculitis 05/09/2015   Hearing loss in right  ear 08/05/2014   Panic attack as reaction to stress 04/20/2012   Dysphagia, neurologic 01/07/2012   Morbid obesity (HCC) 01/07/2012   Chest pain, musculoskeletal 12/07/2011   Candidiasis of skin 08/12/2011   Annual physical exam 06/07/2011   OLECRANON BURSITIS 01/29/2011   PARTIAL ARTERIAL OCCLUSION OF RETINA 05/27/2010   Essential hypertension, benign 01/16/2010   ABNORMAL ELECTROCARDIOGRAM 01/14/2010   Asthma 10/14/2009   GERD 10/14/2009   Bipolar disorder (HCC) 02/20/2009   PHIMOSIS 02/20/2009   KNEE PAIN, CHRONIC 11/20/2008   HIP PAIN, RIGHT 08/19/2008   DM type 2 with diabetic peripheral neuropathy (HCC) 02/22/2007   Hyperlipidemia associated with type 2 diabetes mellitus (HCC) 02/22/2007   ALLERGIC RHINITIS, SEASONAL 02/22/2007   LOW BACK PAIN, CHRONIC 02/22/2007   MENIERE'S DISEASE, HX OF 02/22/2007    PCP: Amon Aloysius BRAVO, MD   REFERRING PROVIDER: Amon Aloysius BRAVO, MD  (Neurologist - Evonnie Stabs, MD)  REFERRING DIAG:  [phone] (ICD-10-CM) - Neuroleptic-induced parkinsonism (HCC)  R26.9 (ICD-10-CM) - Gait disorder  R29.6 (ICD-10-CM) - Multiple falls  THERAPY DIAG:  Muscle weakness (generalized)  Unsteadiness on feet  Other abnormalities of gait and mobility  History of falling  RATIONALE FOR EVALUATION AND TREATMENT: Rehabilitation  ONSET DATE: ~2013 - 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 Yes: NPRS scale: 3/10  Pain location: L shoulder and elbow Pain description: aching  Aggravating factors: walking or  moving around  Relieving factors: rest   PERTINENT HISTORY:  Anxiety, asthma, bipolar affective disorder, COPD, depression, DM-II, DJD, chronic knee pain, B TKA, GERD, HTN, Mnire's disease, obesity, pancreatitis, h/o CVA, neurologic dysphagia, tardive dyskinesia, HOH - L deaf & R hearing aid  PRECAUTIONS: Fall  RED FLAGS: None  WEIGHT BEARING RESTRICTIONS: No  FALLS:  Has patient fallen in last 6 months? No and before the levodopa  supplements, falls were daily (sometimes several times a day)  LIVING ENVIRONMENT: Lives with: lives with their spouse and dog Lives in: Apartment Stairs: No Has following equipment at home: Single point cane, Environmental Consultant - 2 wheeled, shower chair, and Grab bars (platform RW)  OCCUPATION: Retired  PLOF: Independent, Independent with community mobility with device, and Leisure: watching TV, listening to books, programming, walking ~15 min (around the block) - tries to do daily   PATIENT GOALS: To get back to the point where I can walk to the car unassisted (no AD) and use platform RW less in community.  Be able to assist my wife with bringing groceries in from the car.   OBJECTIVE: (objective measures completed at initial evaluation unless otherwise dated)  DIAGNOSTIC FINDINGS:  N/A - Multiple imaging studies in 2024 s/p falls but no acute fractures.  COGNITION: Overall cognitive status: Impaired and History of cognitive impairments - at baseline - mostly memory issues resulting from ECT 19 yrs ago, better over last 3 months but still notes limited STM   SENSATION: WFL  COORDINATION: B Heel/toe WFL Heel to shin unable bilaterally   POSTURE:  rounded shoulders, forward head, and flexed trunk   MUSCLE LENGTH: Hamstrings: mod/severe tight B Piriformis: mild /mod tight R>L Hip flexors: mod tight   LOWER EXTREMITY ROM:  Grossly WFL except limited hip extension and rotation bilaterally  LOWER EXTREMITY MMT:    MMT Right eval Left eval R  09/06/24 L 09/06/24  Hip flexion 3+ 3+ 3+ 3+  Hip extension 3+ 3+ 3+ 3+  Hip abduction 3- 3 3- 3-  Hip adduction 3+ 3+ 3- 3-  Hip internal rotation 4- 3+ 4- 4-  Hip external rotation 4- 3+ 4- 4-  Knee flexion 4 4 4- 4  Knee extension 4 4 4- 4  Ankle dorsiflexion 4- 4- 4- 4  Ankle plantarflexion      Ankle inversion      Ankle eversion      (Blank rows = not tested)  BED MOBILITY:  SBA  TRANSFERS: Assistive device utilized: None  Sit to stand: Modified independence and SBA Stand to sit: Modified independence and SBA Chair to chair: SBA Floor: NT  GAIT: Distance walked: clinic distances Assistive device utilized: Single point cane, platform RW/2WW, and None Level of assistance: SBA Gait pattern: step through pattern, decreased arm swing- Right, decreased arm swing- Left, decreased stride length, decreased hip/knee flexion- Right, decreased hip/knee flexion- Left, decreased ankle dorsiflexion- Right, decreased ankle dorsiflexion- Left, shuffling, festinating, trunk flexed, poor foot clearance- Right, and poor foot clearance- Left Comments: gait deviations more pronounced with decreasing AD support  FUNCTIONAL TESTS:  5 times sit to stand: unable w/o UE assist - only able to complete 4 reps in 34.03 sec; 20.18 sec with B UE assist Timed up and go (TUG): Normal - 15.84 sec w/o AD, Manual - 15.19 sec w/o AD, Cognitive - 15.66 sec w/o AD 10 meter walk test: 18.25 sec w/o AD, 16.56 sec with SPC, 14.78 sec with platform RW Gait speed: 1.80 ft/sec w/o AD, 1.98 ft/sec with SPC, 2.22 ft/sec with platform RW Berg balance scale: 41/56, 37-45 indicates significant (>80%) risk for falls (06/26/24) DGI: 12/24, <19/24 indicates high risk for falls (06/26/24)  PATIENT SURVEYS:  ABC scale: 620 / 1600 = 38.8 %, indicating a low level of physical functioning (<69% indicates risk for recurrent falls in PD) 09/06/24 - ABC scale: 09/06/24 - 160 / 1600 = 10.0 %   TODAY'S TREATMENT:   09/19/24 THERAPEUTIC EXERCISE: To improve strength and endurance.  Demonstration, verbal and tactile cues throughout for technique. NuStep - L5 x 6 min (LE only) Seated LAQ BLE  10 Seated marching BLE x 10 NEUROMUSCULAR RE-EDUCATION:   [facility] PT Assessment - 09/19/24 0001       Berg Balance Test   Sit to Stand Able to stand without using hands and stabilize independently    Standing Unsupported Able to stand safely 2 minutes    Sitting with Back Unsupported but Feet Supported on Floor or Stool Able to sit safely and securely 2 minutes    Stand to Sit Sits safely with minimal use of hands    Transfers Able to transfer safely, minor use of hands    Standing Unsupported with Eyes Closed Able to stand 10 seconds safely    Standing Unsupported with Feet Together Able to place feet together independently and stand 1 minute safely    From Standing, Reach Forward with Outstretched Arm Can reach confidently >25 cm (10)    From Standing Position, Pick up Object from Floor Able to pick up shoe safely and easily    From Standing Position, Turn to Look Behind Over each Shoulder Looks behind from both sides and weight shifts well    Turn 360 Degrees Able to turn 360  degrees safely but slowly    Standing Unsupported, Alternately Place Feet on Step/Stool Needs assistance to keep from falling or unable to try    Standing Unsupported, One Foot in Front Needs help to step but can hold 15 seconds    Standing on One Leg Unable to try or needs assist to prevent fall    Total Score 43          09/18/24 THERAPEUTIC EXERCISE: To improve strength and endurance.  Demonstration, verbal and tactile cues throughout for technique. NuStep - L5 x 6 min (LE only) NEUROMUSCULAR RE-EDUCATION:  Seated on dynadisk hands on table for support: Sitting balance LAQ x 5 BLE- very difficult Perturbations 4 ways Marching x 5 BLE  Gait training:  270 with roller walker - some R foot drag, very short steps B  Sit to  stands with chair in front very challenging 3x lots of effort standing even with support  09/06/2024 - Recertification THERAPEUTIC ACTIVITIES: To improve functional performance.  Demonstration, verbal and tactile cues throughout for technique. LE MMT 5xSTS = 32.06 sec with B UE assist TUG = 40.50 sec with platform 4WW ABC Scale: 09/06/24 - 160 / 1600 = 10.0 % = 28.63 sec with platform 4WW Gait speed = 1.15 ft/sec with platform 4WW Goal assessment   08/16/2024  THERAPEUTIC EXERCISE: To improve strength and endurance.  Demonstration, verbal and tactile cues throughout for technique. NuStep - L5 x 6 min (LE only)  THERAPEUTIC ACTIVITIES: To improve functional performance.  Demonstration, verbal and tactile cues throughout for technique. with upright/platform 4WW = 15.16 sec (normal speed), 13.84 sec (fastest comfortable speed) Gait speed with upright/platform 4WW = 2.16 ft/sec (normal speed), 2.37 ft/sec (fastest comfortable speed)  SELF CARE:  Provided education on community resources related to Parkinson's including support group and educational sessions, community-based PWR! Moves and exercise/movement groups as well as online resources related to PD.   NEUROMUSCULAR RE-EDUCATION: To improve balance, coordination, kinesthesia, posture, proprioception, reduce fall risk, amplitude of movement, speed of movement to reduce bradykinesia, and reduce rigidity.  Standing between backs of 2 chair for UE support with SBA of PT: PWR! Step forward x 10 bil - min A x 1 for posterior LOB PWR! Step backward x 10 bil PWR! Step through forward & backward x 10 bil  GAIT TRAINING: To normalize gait pattern and prepare to wean to LRAD. 40 ft with B hiking poles and CGA of PT via gait belt - cues for reciprocal sequencing with pt having difficulty achieving normal step pattern   08/13/24 Nustep L5x81min LE only ABC scale: 810 / 1600 = 50.6 % TUG = 21.01 sec with platform 4WW Completed BERG  & DGI -- see below  Berg Balance Test   Sit to Stand Able to stand  independently using hands    Standing Unsupported Able to stand safely 2 minutes    Sitting with Back Unsupported but Feet Supported on Floor or Stool Able to sit safely and securely 2 minutes    Stand to Sit Sits safely with minimal use of hands    Transfers Able to transfer safely, minor use of hands    Standing Unsupported with Eyes Closed Able to stand 10 seconds safely    Standing Unsupported with Feet Together Able to place feet together independently and stand 1 minute safely    From Standing, Reach Forward with Outstretched Arm Can reach confidently >25 cm (10)    From Standing Position, Pick up Object from Floor  Able to pick up shoe safely and easily    From Standing Position, Turn to Look Behind Over each Shoulder Looks behind from both sides and weight shifts well    Turn 360 Degrees Able to turn 360 degrees safely one side only in 4 seconds or less    Standing Unsupported, Alternately Place Feet on Step/Stool Able to stand independently and complete 8 steps >20 seconds    Standing Unsupported, One Foot in Front Able to take small step independently and hold 30 seconds    Standing on One Leg Unable to try or needs assist to prevent fall    Total Score 47      Dynamic Gait Index   Level Surface Moderate Impairment    Change in Gait Speed Mild Impairment    Gait with Horizontal Head Turns Mild Impairment    Gait with Vertical Head Turns Mild Impairment    Gait and Pivot Turn Normal    Step Over Obstacle Mild Impairment    Step Around Obstacles Normal    Steps Moderate Impairment    Total Score 16       08/09/2024 THERAPEUTIC EXERCISE: To improve strength and endurance.   NuStep - L4 x 8 min (LE only) Gait around clinic with 270'  NEUROMUSCULAR RE-EDUCATION:  With UE support: Standing heel/toe lift x 10 B Standing hip abduction x 10 B Standing marching x 10 B Standing balance x 1 min; added EC  3x10 Sidesteps 3x down and back  PWR twist x 10    08/06/2024 THERAPEUTIC EXERCISE: To improve strength and endurance.   NuStep - L4 x 8 min (LE only)  THERAPEUTIC ACTIVITIES: To improve functional performance.  Demonstration, verbal and tactile cues throughout for technique. TUG = 19.88 sec with platform 4WW (pt misjudged seat and attempted to sit on armrest of chair), 23.06 sec w/o AD 5xSTS = 20.50 sec with B UE assist, unable w/o UE assist  NEUROMUSCULAR RE-EDUCATION: To improve balance, coordination, kinesthesia, posture, proprioception, reduce fall risk, amplitude of movement, speed of movement to reduce bradykinesia, and reduce rigidity. Seated PWR! Moves: Up x 10 Rock x 10 Twist x 10 Step x 10  PWR! Sit to stand x 7 from Airex pad in chair Standing PWR! Moves (modified with chair in front for intermittent UE support as needed): Up x 10 Rock x 10 - one hand remaining on chair Twist x 10 - one hand remaining on chair Step x 8 - one hand remaining on chair   07/24/2024  THERAPEUTIC EXERCISE: To improve strength, endurance, ROM, and flexibility.  Demonstration, verbal and tactile cues throughout for technique.   - resistance reduced d/t c/o fatigue Gait 270 ft around clinic with walker   NEUROMUSCULAR RE-EDUCATION: To improve coordination, kinesthesia, posture, proprioception Seated:  PWR! Up 2x10  PWR Rock 2x10 each direction  PWR Twist 2x10 each direction  PWR step x 5 each direction very difficult  Sit to stand x 5   07/19/2024  THERAPEUTIC EXERCISE: To improve strength, endurance, ROM, and flexibility.  Demonstration, verbal and tactile cues throughout for technique.  NuStep - L3 x 8 min (LE only) - resistance reduced d/t c/o fatigue Seated marching 2lb 2x10 BLE Seated LAQ 2lb 2x10 BLE Seated hamstring stretch x 30 B Seated piriformis stretch fig 4 x 30 B Gait 90 ft around clinic with walker   NEUROMUSCULAR RE-EDUCATION: To improve coordination, kinesthesia,  posture, proprioception Seated diagonals with RTB x 10 B Seated rows GTB 2x10 Seated shoulder ext  GTB 2x10 Sit to stands x 5 Standing ant/post WS in staggered stance x 10 B with walker   07/12/2024  THERAPEUTIC EXERCISE: To improve strength, endurance, ROM, and flexibility.  Demonstration, verbal and tactile cues throughout for technique.  NuStep - L3 x 6 min (LE only) - resistance reduced d/t c/o fatigue Seated hip hinge HS stretch 3 x 30 bil Seated hip hinge figure-4 piriformis stretch 3 x 30 bil Seated hip flexor stretch in lunge position over edge of seat 3 x 30 bil  NEUROMUSCULAR RE-EDUCATION: To improve coordination, kinesthesia, posture, proprioception, reduce fall risk, amplitude of movement, speed of movement to reduce bradykinesia, and reduce rigidity. Seated PWR! Moves: Up x 10 Rock x 5 bil Twist - deferred d/t increased shoulder pain Step x 5 bil Seated scapular retraction into pool noodle along spine and back of chair 10 x 3 Seated GTB scapular retraction + B shoulder row 10 x 3 Seated GTB scapular retraction + B shoulder extension 10 x 3    PATIENT EDUCATION:  Education details: HEP update - proximal LE stretches, transfer safety, gait safety with platform rollator, and Check for Safety - Home Fall Prevention Checklist for Older Adults   Person educated: Patient Education method: Explanation, Demonstration, Verbal cues, Handouts, and MedBridgeGO app access provided Education comprehension: verbalized understanding, returned demonstration, verbal cues required, and needs further education  HOME EXERCISE PROGRAM: Access Code: [email or web address] URL: [email or web address] Date: 07/12/2024 Prepared by: Elijah Hidden  Exercises - Seated Hip Abduction with Resistance  - 1 x daily - 3 x weekly - 3 sets - 10 reps - Seated Hip Adduction Isometrics with Ball  - 1 x daily - 3 x weekly - 3 sets - 10 reps - Seated March with Resistance  - 1 x daily - 3 x weekly - 3  sets - 10 reps - Sit to Stand  - 1 x daily - 3 x weekly - 1 sets - 5 reps - Seated Hamstring Stretch  - 1-2 x daily - 7 x weekly - 3 reps - 30 sec hold - Seated Figure 4 Piriformis Stretch  - 1-2 x daily - 7 x weekly - 3 reps - 30 sec hold - Seated Hip Flexor Stretch  - 1-2 x daily - 7 x weekly - 3 reps - 30 sec hold  Patient Education - Check for Safety  PWR Moves: - Sitting - Standing (modified)   ASSESSMENT:  CLINICAL IMPRESSION: BERG reassessed today, shows decline from the last time assessed. He fatigues quick with standing so the BERG took majority of the session today.   OBJECTIVE IMPAIRMENTS: Abnormal gait, decreased activity tolerance, decreased balance, decreased coordination, decreased endurance, decreased knowledge of condition, decreased knowledge of use of DME, decreased mobility, difficulty walking, decreased strength, decreased safety awareness, dizziness, impaired perceived functional ability, impaired flexibility, improper body mechanics, postural dysfunction, and pain.   ACTIVITY LIMITATIONS: carrying, lifting, bending, standing, squatting, stairs, transfers, bed mobility, reach over head, locomotion level, and caring for others  PARTICIPATION LIMITATIONS: meal prep, cleaning, laundry, shopping, and community activity  PERSONAL FACTORS: Age, Fitness, Past/current experiences, Time since onset of injury/illness/exacerbation, and 3+ comorbidities: Anxiety, asthma, bipolar affective disorder, COPD, depression, DM-II, DJD, chronic knee pain, B TKA, GERD, HTN, Mnire's disease, obesity, pancreatitis, h/o CVA, neurologic dysphagia, tardive dyskinesia, HOH - L deaf & R hearing aid are also affecting patient's functional outcome.   REHAB POTENTIAL: Good  CLINICAL DECISION MAKING: Unstable/unpredictable  EVALUATION COMPLEXITY: High   GOALS: Goals reviewed with patient? Yes  SHORT TERM GOALS: Target date: 07/30/2024, extended to 10/04/2024  Patient will be independent  with initial HEP. Baseline: To be established 07/05/24 - consistent with current HEP  07/12/24 - clarified HEP frequency Goal status: MET - 08/06/24   2.  Patient will demonstrate decreased fall risk by scoring < 13.5 sec on normal TUG. Baseline: 15.84 sec w/o AD 08/06/24 - 19.88 sec with platform 4WW (pt misjudged seat and attempted to sit on armrest of chair), 23.06 sec w/o AD 08/13/24 - 21.01 sec with platform 4WW Goal status: IN PROGRESS - 09/05/24 - 40.50 sec with platform 4WW  3.  Patient will be educated on strategies to decrease risk of falls.  Baseline:  Goal status: MET - 07/09/24   LONG TERM GOALS: Target date: 09/10/2024, extended to 11/01/2024  Patient will be independent with ongoing/advanced HEP for self-management at home incorporating PWR! Moves as indicated .  Baseline:  Goal status: IN PROGRESS - 08/13/24 - met for current HEP   2.  Patient will be able to ambulate 600' with LRAD with good safety to access community.  Baseline:  Goal status: IN PROGRESS - 08/09/24 - 270' with standing 4WW  3.  Patient will be able to step up/down curb safely with LRAD for safety with community ambulation.  Baseline:  Goal status: IN PROGRESS - 08/16/24 - pt reports he does not attempt curbs with platform 4WW as walker too heavy to navigate up curb   4.  Patient will demonstrate gait speed of >/= 2.62 ft/sec (0.8 m/s) to be a safe limited community ambulator with decreased risk for recurrent falls.  Baseline: 1.80 ft/sec w/o AD, 1.98 ft/sec with SPC and 2.22 ft/sec with platform RW 07/19/24 - 2.26 ft/sec 08/16/24 - 2.16 ft/sec (normal speed), 2.37 ft/sec (fastest comfortable speed) with upright/platform 4WW Goal status: IN PROGRESS - 09/06/24 - 1.15 ft/sec with platform 4WW  5.  Patient will improve 5x STS time to </= 15 seconds w/o need for UE assist to demonstrate improved functional strength and transfer efficiency. Baseline: unable w/o UE assist - only able to complete 4 reps in 34.03  sec; 20.18 sec with B UE assist 08/06/24 - 20.50 sec with B UE assist Goal status: IN PROGRESS - 09/06/24 - 32.06 sec with B UE assist  6.  Patient will demonstrate at least 19/24 on DGI to improve gait stability and reduce risk for falls. Baseline: 12/24 (06/26/24) Goal status: IN PROGRESS - 08/13/24 - 16/24  7.  Patient will improve Berg score by at least 8 points to improve safety and stability with ADLs in standing and reduce risk for falls. (MCID = 8 points)   Baseline: 41/56 (06/26/24) Goal status: IN PROGRESS - 08/13/24 - 47/56  8.  Patient will report >/= 52% on ABC scale to demonstrate improved balance confidence and decreased risk for falls. Baseline: 620 / 1600 = 38.8 % 08/13/24 - 810 / 1600 = 50.6 % Goal status: IN PROGRESS - 09/06/24 - 160 / 1600 = 10.0 %  9. Patient will verbalize understanding of local Parkinson's disease community resources, including community fitness post d/c. Baseline:  Goal status: IN PROGRESS - 08/16/24 - handouts provided   PLAN:  PT FREQUENCY: 2x/week  PT DURATION: 12 weeks  PLANNED INTERVENTIONS: [id number]- PT Re-evaluation, 97750- Physical Performance Testing, 97110-Therapeutic exercises, 97530- Therapeutic activity, [id number]- Neuromuscular re-education, 97535- Self Care, [id number]- Manual therapy, [id number]- Gait training, [phone]- Canalith repositioning, [id number]- Electrical stimulation (unattended), 97035- Ultrasound, 02966- Ionotophoresis 4mg /ml Dexamethasone , 79439 (1-2 muscles), [id number] (3+  muscles)- Dry Needling, Patient/Family education, Balance training, Stair training, Taping, Joint mobilization, Vestibular training, DME instructions, Cryotherapy, and Moist heat  PLAN FOR NEXT SESSION: continue LE strengthening; balance activities (both static and dynamic); PWR! Moves    Arvil Utz L Gordie Crumby, PTA  09/19/2024, 12:02 PM       Date of referral: 05/31/24 Referring provider: Amon Aloysius BRAVO, MD Referring diagnosis?  G21.11,T43.505A (ICD-10-CM) - Neuroleptic-induced  parkinsonism (HCC)  R26.9 (ICD-10-CM) - Gait disorder  R29.6 (ICD-10-CM) - Multiple falls   Treatment diagnosis? (if different than referring diagnosis)  Muscle weakness (generalized)  Unsteadiness on feet  Other abnormalities of gait and mobility  History of falling  What was this (referring dx) caused by? Ongoing Issue and Other: neurodegenerative disease  Nature of Condition: Chronic (continuous duration > 3 months)   Laterality: Both  Current Functional Measure Score: Other ABC Scale: 810 / 1600 = 50.6 %, indicating a moderate level of physical functioning (<69% indicates risk for recurrent falls in PD)    Objective measurements identify impairments when they are compared to normal values, the uninvolved extremity, and prior level of function.  [x]  Yes  []  No  Objective assessment of functional ability: Moderate functional limitations   Briefly describe symptoms:  Aahan is progressing with PT demonstrating improving tolerance for standing activities, improving gait safety and speed with his platform 4WW, and decreasing fall risk per improvements in Berg from 41/56 to 47/56 (indicating a reduction in fall risk from significant (greater than 80%) to moderate moderate (>50%) and DGI from 12/24 to 16/24 (scores of <19/24 still indicating high risk for falls) today.  He has not had any falls in the past month.  Lajuane will benefit from continued skilled PT to address ongoing motor control, strength and balance deficits to improve mobility and activity tolerance with decreased pain interference and decreased risk for falls.   How did symptoms start: gradual onset  Average pain intensity:  Last 24 hours: 2/10  Past week: 2/10  How often does the pt experience symptoms? Frequently  How much have the symptoms interfered with usual daily activities? Moderately  How has condition changed since care began at this facility? A little better   In general, how is the patients overall  health? Fair  Onset date: Parkinsonism diagnosis in 2013, worsening weakness over the past 6 months    BACK PAIN (STarT Back Screening Tool) - (When applicable):   Has your back pain spread down your leg(s) at sometime in the last 2 weeks? []  Yes   [x]  No Have you had pain in the shoulder or neck at sometime in the past 2 weeks? [x]  Yes   []  No Have you only walked short distances because of your back pain? []  Yes   [x]  No In the past 2 weeks, have you dressed more slowly than usual because of your back pain? []  Yes   [x]  No Do you think it is not really safe for person with a condition like yours to be physically active? []  Yes   [x]  No Have worrying thoughts been going through your mind a lot of the time? [x]  Yes   []  No Do you feel that your back pain is terrible and it is never going to get any better? []  Yes   [x]  No In general, have you stopped enjoying all the things you usually enjoy? [x]  Yes   []  No Overall, how bothersome has your back pain been in the last 2 weeks? []  Not at  all   [x]  Slightly     []  Moderate   []  Very much     []  Extremely

## 2024-09-20 ENCOUNTER — Other Ambulatory Visit: Payer: Self-pay | Admitting: Internal Medicine

## 2024-09-21 ENCOUNTER — Ambulatory Visit: Admitting: Urology

## 2024-09-21 ENCOUNTER — Encounter: Payer: Self-pay | Admitting: Urology

## 2024-09-21 VITALS — BP 154/83 | HR 85 | Ht 70.0 in | Wt 235.0 lb

## 2024-09-21 DIAGNOSIS — N35911 Unspecified urethral stricture, male, meatal: Secondary | ICD-10-CM | POA: Diagnosis not present

## 2024-09-21 DIAGNOSIS — R338 Other retention of urine: Secondary | ICD-10-CM

## 2024-09-21 DIAGNOSIS — R3912 Poor urinary stream: Secondary | ICD-10-CM

## 2024-09-21 DIAGNOSIS — N489 Disorder of penis, unspecified: Secondary | ICD-10-CM

## 2024-09-21 DIAGNOSIS — Q5564 Hidden penis: Secondary | ICD-10-CM

## 2024-09-21 DIAGNOSIS — N401 Enlarged prostate with lower urinary tract symptoms: Secondary | ICD-10-CM

## 2024-09-21 LAB — URINALYSIS, ROUTINE W REFLEX MICROSCOPIC
Glucose, UA: NEGATIVE
Nitrite, UA: NEGATIVE
RBC, UA: NEGATIVE
Specific Gravity, UA: >=1.03 — AB (ref 1.005–1.030)
Urobilinogen, Ur: 1 mg/dL (ref 0.2–1.0)
pH, UA: 5.5 (ref 5.0–7.5)

## 2024-09-21 LAB — MICROSCOPIC EXAMINATION

## 2024-09-21 NOTE — Progress Notes (Signed)
 Assessment: 1. Penile lesion   2. Hidden penis   3. 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 Benign localized prostatic hyperplasia with lower urinary tract symptoms (LUTS)     Plan: The lesion on the preputial skin has not responded to topical therapy.  This does not appear to be Candida balanitis. I think a biopsy would be appropriate.  I am going to see if we can arrange this in the office in the near future. I advised him that he will need to hold his Plavix  prior to any office procedure. Continue solifenacin  Will contact him to arrange follow-up.  Chief Complaint: Chief Complaint  Patient presents with   Penis Pain    HPI: Ryan Zhang is a 78 y.o. male who presents for continued evaluation of hidden penis, BXO with meatal stenosis, BPH with LUTS. He was followed for a number of years by [facility].  He was seen by Dr. Shona in February 2024 for evaluation of his lower urinary tract symptoms.  He has a long history of BPH with lower urinary tract symptoms with prominent urgency.  He previously took tamsulosin  and alfuzosin without benefit.  He was taking Vesicare  10 mg daily with some improvement.  At his last visit in February 2025, his lower urinary tract symptoms were fairly stable with the solifenacin . IPSS = 15/3.  He has a long history of BXO with meatal stenosis.  He is status post a circumcision with repeat circumcision and urethral dilation in 2010.  Attempted office cystoscopy in June 2024 was unsuccessful due to his phimosis and BXO. He was seen in the emergency room in May 2025 for dysuria and penile pain.  He was treated for balanitis with oral fluconazole .  PSA 2/24: 0.20  Urine culture from 5/24 grew >100 K Klebsiella.  He was seen in June 2025 for evaluation of his hidden penis.  He reported problems with balanitis.  He is unable to retract his foreskin.  He voids sitting down and has a slow stream.  He was not having any  dysuria.  No gross hematuria.  He continued on solifenacin  with stable lower urinary tract symptoms.  He presents today for evaluation of a red lesion on the foreskin.  He reports this has been present for approximately 2 months.  He has tried topical therapy with nystatin  and Lotrisone  cream.  He was also given a course of Diflucan .  He has not seen a change in the lesion. Continues on solifenacin .  He continues with lower urinary tract symptoms including a weak stream, intermittent stream, and incomplete emptying.  He is having some intermittent dysuria.  No gross hematuria. IPSS = 20/3.  Portions of the above documentation were copied from a prior visit for review purposes only.  Allergies: Allergies  Allergen Reactions   Celexa  [Citalopram Hydrobromide] Other (See Comments)   Depakote  [Divalproex Sodium] Other (See Comments)   Methocarbamol     Other reaction(s): Hallucinations   Paroxetine Hcl     Other reaction(s): Insomnia   Smz-Tmp Ds [Sulfamethoxazole-Trimethoprim] Nausea And Vomiting   Other     Symbrax alteration in blood sugar, pt unsure if low or high blood sugar   Oxycodone-Acetaminophen      Other reaction(s): Unknown   Risperidone Other (See Comments)    REACTION: hyper    PMH: Past Medical History:  Diagnosis Date   Allergy    allergy shots Dr. Cloretta   Anemia    Anxiety  Asthma    moderate persistant   Bipolar affective (HCC)    Cataract    both eyes   Clotting disorder    Due to blood thinner   COPD (chronic obstructive pulmonary disease) (HCC)    Depression    Diabetes mellitus    DJD (degenerative joint disease)    Dysphagia    Gallstones    hx of, s/p cholecystectomy   GERD (gastroesophageal reflux disease)    Hepatitis A    as teenager 60's   Hollenhorst plaque    right eye   Hyperlipidemia    Hypertension    Kidney stones    hx of   Meniere's disease    Motility disorder, esophageal    Obesity    Pancreatitis    Personal  history of colonic polyps 11/2004   hyperplastic.   Stroke [facility])    per MRI    PSH: Past Surgical History:  Procedure Laterality Date   CATARACT EXTRACTION Bilateral 09/2015 and 10/2015   CHOLECYSTECTOMY     COLONOSCOPY     EYE SURGERY     JOINT REPLACEMENT     TOTAL KNEE ARTHROPLASTY Bilateral    both knees    SH: Social History   Tobacco Use   Smoking status: Former    Current packs/day: 0.00    Average packs/day: 1 pack/day for 1 year (1.0 ttl pk-yrs)    Types: Cigarettes    Start date: 11/16/1963    Quit date: 11/15/1964    Years since quitting: 59.8   Smokeless tobacco: Never   Tobacco comments:    Questions not useful  Vaping Use   Vaping status: Never Used  Substance Use Topics   Alcohol use: Not Currently   Drug use: No    ROS: Constitutional:  Negative for fever, chills, weight loss CV: Negative for chest pain, previous MI, hypertension Respiratory:  Negative for shortness of breath, wheezing, sleep apnea, frequent cough GI:  Negative for nausea, vomiting, bloody stool, GERD  PE: BP (!) 154/83   Pulse 85   Ht 5' 10 (1.778 m)   Wt 235 lb (106.6 kg)   BMI 33.72 kg/m  GENERAL APPEARANCE:  Well appearing, well developed, well nourished, NAD HEENT:  Atraumatic, normocephalic, oropharynx clear NECK:  Supple without lymphadenopathy or thyromegaly ABDOMEN:  Soft, non-tender, no masses EXTREMITIES:  Moves all extremities well, without clubbing, cyanosis, or edema NEUROLOGIC:  Alert and oriented x 3,  CN II-XII grossly intact MENTAL STATUS:  appropriate BACK:  Non-tender to palpation, No CVAT SKIN:  Warm, dry, and intact GU: Penis:  hidden penis; erythematous plaque on ventral preputial skin   Results: U/A: 6-10 WBCs, 0-2 RBCs

## 2024-09-25 ENCOUNTER — Encounter: Payer: Self-pay | Admitting: Physical Therapy

## 2024-09-25 ENCOUNTER — Ambulatory Visit: Admitting: Physical Therapy

## 2024-09-25 DIAGNOSIS — M6281 Muscle weakness (generalized): Secondary | ICD-10-CM | POA: Diagnosis not present

## 2024-09-25 DIAGNOSIS — R2689 Other abnormalities of gait and mobility: Secondary | ICD-10-CM

## 2024-09-25 DIAGNOSIS — R2681 Unsteadiness on feet: Secondary | ICD-10-CM

## 2024-09-25 DIAGNOSIS — Z9181 History of falling: Secondary | ICD-10-CM

## 2024-09-25 NOTE — Therapy (Signed)
 OUTPATIENT PHYSICAL THERAPY TREATMENT     Patient Name: Ryan Zhang MRN: [id number] DOB:Jun 06, 1946, 78 y.o., male Today's Date: 09/25/2024   END OF SESSION:  PT End of Session - 09/25/24 1147     Visit Number 15    Date for Recertification  11/01/24    Authorization Type [employer]    Authorization Time Period 09/06/24- 10/04/24    Authorization - Visit Number 4    Authorization - Number of Visits 8    Progress Note Due on Visit 22   Recert/PN on visit #12 (09/06/24)   PT Start Time 1147    PT Stop Time 1236    PT Time Calculation (min) 49 min    Activity Tolerance Patient limited by fatigue    Behavior During Therapy WFL for tasks assessed/performed                Past Medical History:  Diagnosis Date   Allergy    allergy shots Dr. Cloretta   Anemia    Anxiety    Asthma    moderate persistant   Bipolar affective (HCC)    Cataract    both eyes   Clotting disorder    Due to blood thinner   COPD (chronic obstructive pulmonary disease) (HCC)    Depression    Diabetes mellitus    DJD (degenerative joint disease)    Dysphagia    Gallstones    hx of, s/p cholecystectomy   GERD (gastroesophageal reflux disease)    Hepatitis A    as teenager 60's   Hollenhorst plaque    right eye   Hyperlipidemia    Hypertension    Kidney stones    hx of   Meniere's disease    Motility disorder, esophageal    Obesity    Pancreatitis    Personal history of colonic polyps 11/2004   hyperplastic.   Stroke [facility])    per MRI   Past Surgical History:  Procedure Laterality Date   CATARACT EXTRACTION Bilateral 09/2015 and 10/2015   CHOLECYSTECTOMY     COLONOSCOPY     EYE SURGERY     JOINT REPLACEMENT     TOTAL KNEE ARTHROPLASTY Bilateral    both knees   Patient Active Problem List   Diagnosis Date Noted   Hypomagnesemia 09/08/2024   Tinea cruris 07/24/2020   PCP NOTES >>>>>>>>>>>>>>>>>>>>>>>>>>>>>>> 01/12/2016   Folliculitis 05/09/2015   Hearing loss in right  ear 08/05/2014   Panic attack as reaction to stress 04/20/2012   Dysphagia, neurologic 01/07/2012   Morbid obesity (HCC) 01/07/2012   Chest pain, musculoskeletal 12/07/2011   Candidiasis of skin 08/12/2011   Annual physical exam 06/07/2011   OLECRANON BURSITIS 01/29/2011   PARTIAL ARTERIAL OCCLUSION OF RETINA 05/27/2010   Essential hypertension, benign 01/16/2010   ABNORMAL ELECTROCARDIOGRAM 01/14/2010   Asthma 10/14/2009   GERD 10/14/2009   Bipolar disorder (HCC) 02/20/2009   PHIMOSIS 02/20/2009   KNEE PAIN, CHRONIC 11/20/2008   HIP PAIN, RIGHT 08/19/2008   DM type 2 with diabetic peripheral neuropathy (HCC) 02/22/2007   Hyperlipidemia associated with type 2 diabetes mellitus (HCC) 02/22/2007   ALLERGIC RHINITIS, SEASONAL 02/22/2007   LOW BACK PAIN, CHRONIC 02/22/2007   MENIERE'S DISEASE, HX OF 02/22/2007    PCP: Amon Aloysius BRAVO, MD   REFERRING PROVIDER: Amon Aloysius BRAVO, MD  (Neurologist - Evonnie Stabs, MD)  REFERRING DIAG:  [phone] (ICD-10-CM) - Neuroleptic-induced parkinsonism (HCC)  R26.9 (ICD-10-CM) - Gait disorder  R29.6 (ICD-10-CM) - Multiple falls  THERAPY DIAG:  Muscle weakness (generalized)  Unsteadiness on feet  Other abnormalities of gait and mobility  History of falling  RATIONALE FOR EVALUATION AND TREATMENT: Rehabilitation  ONSET DATE: ~2013 - 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  PAIN: Are you having pain? Yes: NPRS scale: 4-5/10  Pain location: L shoulder and R elbow Pain description: aching   Aggravating factors: walking or moving around  Relieving factors: rest   PERTINENT HISTORY:  Anxiety, asthma, bipolar affective disorder, COPD, depression, DM-II, DJD, chronic knee pain, B TKA, GERD, HTN, Mnire's disease, obesity, pancreatitis, h/o CVA, neurologic dysphagia, tardive dyskinesia, HOH - L deaf & R hearing aid  PRECAUTIONS: Fall  RED FLAGS: None  WEIGHT BEARING RESTRICTIONS: No  FALLS:  Has patient fallen in last 6 months? No and before the levodopa  supplements, falls were daily (sometimes several times a day)  LIVING ENVIRONMENT: Lives with: lives with their spouse and dog Lives in: Apartment Stairs: No Has following equipment at home: Single point cane, Environmental Consultant - 2 wheeled, shower chair, and Grab bars (platform RW)  OCCUPATION: Retired  PLOF: Independent, Independent with community mobility with device, and Leisure: watching TV, listening to books, programming, walking ~15 min (around the block) - tries to do daily   PATIENT GOALS: To get back to the point where I can walk to the car unassisted (no AD) and use platform RW less in community.  Be able to assist my wife with bringing groceries in from the car.   OBJECTIVE: (objective measures completed at initial evaluation unless otherwise dated)  DIAGNOSTIC FINDINGS:  N/A - Multiple imaging studies in 2024 s/p falls but no acute fractures.  COGNITION: Overall cognitive status: Impaired and History of cognitive impairments - at baseline - mostly memory issues resulting from ECT 19 yrs ago, better over last 3 months but still notes limited STM   SENSATION: WFL  COORDINATION: B Heel/toe WFL Heel to shin unable bilaterally   POSTURE:  rounded shoulders, forward head, and flexed trunk   MUSCLE LENGTH: Hamstrings: mod/severe tight B Piriformis: mild /mod tight R>L Hip flexors:  mod tight   LOWER EXTREMITY ROM:    Grossly WFL except limited hip extension and rotation bilaterally  LOWER EXTREMITY MMT:     MMT Right eval Left eval R 09/06/24 L 09/06/24  Hip flexion 3+ 3+ 3+ 3+  Hip extension 3+ 3+ 3+ 3+  Hip abduction 3- 3 3- 3-  Hip adduction 3+ 3+ 3- 3-  Hip internal rotation 4- 3+ 4- 4-  Hip external rotation 4- 3+ 4- 4-  Knee flexion 4 4 4- 4  Knee extension 4 4 4- 4  Ankle dorsiflexion 4- 4- 4- 4  Ankle plantarflexion      Ankle inversion      Ankle eversion      (Blank rows = not tested)  BED MOBILITY:  SBA  TRANSFERS: Assistive device utilized: None  Sit to stand: Modified independence and SBA Stand to sit: Modified independence and SBA Chair to chair: SBA Floor: NT  GAIT: Distance walked: clinic distances Assistive device utilized: Single point cane, platform RW/2WW, and None Level of assistance: SBA Gait pattern: step through pattern, decreased arm swing- Right, decreased arm swing- Left, decreased stride length, decreased hip/knee flexion- Right, decreased hip/knee flexion- Left, decreased ankle dorsiflexion- Right, decreased ankle dorsiflexion- Left, shuffling, festinating, trunk flexed, poor foot clearance- Right, and poor foot clearance- Left Comments: gait deviations more pronounced with decreasing AD support  FUNCTIONAL TESTS:  5 times sit to stand: unable w/o UE assist - only able to complete 4 reps in 34.03 sec; 20.18 sec with B UE assist Timed up and go (TUG): Normal - 15.84 sec w/o AD, Manual - 15.19 sec w/o AD, Cognitive - 15.66 sec w/o AD 10 meter walk test: 18.25 sec w/o AD, 16.56 sec with SPC, 14.78 sec with platform RW Gait speed: 1.80 ft/sec w/o AD, 1.98 ft/sec with SPC, 2.22 ft/sec with platform RW Berg balance scale: 41/56, 37-45 indicates significant (>80%) risk for falls (06/26/24) DGI: 12/24, <19/24 indicates high risk for falls (06/26/24)  PATIENT SURVEYS:  ABC scale: 620 / 1600 = 38.8 %, indicating a low level of physical functioning (<69% indicates risk for recurrent falls in PD) 09/06/24 - ABC scale: 09/06/24 - 160 / 1600 = 10.0  %   TODAY'S TREATMENT:   09/25/2024  THERAPEUTIC EXERCISE: To improve strength and endurance.  Demonstration, verbal and tactile cues throughout for technique.  NuStep - L5 x 6 min (started with B UE/LE, but shifted to LE only d/t irritating UE pain) Seated GTB B hip ABD/ER clam 2 x 10 Seated GTB alt hip flexion march 2 x 10  THERAPEUTIC ACTIVITIES: To improve functional performance.  Demonstration, verbal and tactile cues throughout for technique. Orthostatic BP assessment:  Time BP HR Symptoms  Lying down 5 min 132/70 84 No dizziness  Sitting 1-2 min 112/60 83 Spinning dizziness  Standing 1-2 min 104/56 91 Spinning dizziness  Sit to stand w/o UE assist from slightly elevated treatment table focusing on increased fwd weight shift to reduce posterior LOB.  SELF CARE: Provided education to reduce fall risk.  Provided education on orthostatic/postural hypotension including symptoms, causes and strategies to minimize symptoms and reduce risk for falls: Need for adequate hydration  Slow transitions with pause to ensure no dizziness or that dizziness has resolved Gentle LE exercises before initiation of movement   09/19/24 THERAPEUTIC EXERCISE: To improve strength and endurance.  Demonstration, verbal and tactile cues throughout for technique. NuStep - L5 x 6 min (LE only) Seated LAQ BLE  10 Seated marching BLE  x 10  NEUROMUSCULAR RE-EDUCATION:  Berg Balance Test  Sit to Stand Able to stand without using hands and stabilize independently   Standing Unsupported Able to stand safely 2 minutes   Sitting with Back Unsupported but Feet Supported on Floor or Stool Able to sit safely and securely 2 minutes   Stand to Sit Sits safely with minimal use of hands   Transfers Able to transfer safely, minor use of hands   Standing Unsupported with Eyes Closed Able to stand 10 seconds safely   Standing Unsupported with Feet Together Able to place feet together independently and stand 1 minute  safely   From Standing, Reach Forward with Outstretched Arm Can reach confidently >25 cm (10)   From Standing Position, Pick up Object from Floor Able to pick up shoe safely and easily   From Standing Position, Turn to Look Behind Over each Shoulder Looks behind from both sides and weight shifts well   Turn 360 Degrees Able to turn 360 degrees safely but slowly   Standing Unsupported, Alternately Place Feet on Step/Stool Needs assistance to keep from falling or unable to try   Standing Unsupported, One Foot in Front Needs help to step but can hold 15 seconds   Standing on One Leg Unable to try or needs assist to prevent fall   Total Score 43   Interpretation: 37-45 indicates significant (>80%) risk for falls (06/26/24)      09/18/24 THERAPEUTIC EXERCISE: To improve strength and endurance.  Demonstration, verbal and tactile cues throughout for technique. NuStep - L5 x 6 min (LE only) NEUROMUSCULAR RE-EDUCATION:  Seated on dynadisk hands on table for support: Sitting balance LAQ x 5 BLE- very difficult Perturbations 4 ways Marching x 5 BLE  Gait training:  270 with roller walker - some R foot drag, very short steps B  Sit to stands with chair in front very challenging 3x lots of effort standing even with support  09/06/2024 - Recertification THERAPEUTIC ACTIVITIES: To improve functional performance.  Demonstration, verbal and tactile cues throughout for technique. LE MMT 5xSTS = 32.06 sec with B UE assist TUG = 40.50 sec with platform 4WW ABC Scale: 09/06/24 - 160 / 1600 = 10.0 % = 28.63 sec with platform 4WW Gait speed = 1.15 ft/sec with platform 4WW Goal assessment   08/16/2024  THERAPEUTIC EXERCISE: To improve strength and endurance.  Demonstration, verbal and tactile cues throughout for technique. NuStep - L5 x 6 min (LE only)  THERAPEUTIC ACTIVITIES: To improve functional performance.  Demonstration, verbal and tactile cues throughout for technique. with  upright/platform 4WW = 15.16 sec (normal speed), 13.84 sec (fastest comfortable speed) Gait speed with upright/platform 4WW = 2.16 ft/sec (normal speed), 2.37 ft/sec (fastest comfortable speed)  SELF CARE:  Provided education on community resources related to Parkinson's including support group and educational sessions, community-based PWR! Moves and exercise/movement groups as well as online resources related to PD.   NEUROMUSCULAR RE-EDUCATION: To improve balance, coordination, kinesthesia, posture, proprioception, reduce fall risk, amplitude of movement, speed of movement to reduce bradykinesia, and reduce rigidity.  Standing between backs of 2 chair for UE support with SBA of PT: PWR! Step forward x 10 bil - min A x 1 for posterior LOB PWR! Step backward x 10 bil PWR! Step through forward & backward x 10 bil  GAIT TRAINING: To normalize gait pattern and prepare to wean to LRAD. 40 ft with B hiking poles and CGA of PT via gait belt - cues for  reciprocal sequencing with pt having difficulty achieving normal step pattern   08/13/24 Nustep L5x63min LE only ABC scale: 810 / 1600 = 50.6 % TUG = 21.01 sec with platform 4WW Completed BERG & DGI -- see below  Berg Balance Test   Sit to Stand Able to stand  independently using hands    Standing Unsupported Able to stand safely 2 minutes    Sitting with Back Unsupported but Feet Supported on Floor or Stool Able to sit safely and securely 2 minutes    Stand to Sit Sits safely with minimal use of hands    Transfers Able to transfer safely, minor use of hands    Standing Unsupported with Eyes Closed Able to stand 10 seconds safely    Standing Unsupported with Feet Together Able to place feet together independently and stand 1 minute safely    From Standing, Reach Forward with Outstretched Arm Can reach confidently >25 cm (10)    From Standing Position, Pick up Object from Floor Able to pick up shoe safely and easily    From Standing Position,  Turn to Look Behind Over each Shoulder Looks behind from both sides and weight shifts well    Turn 360 Degrees Able to turn 360 degrees safely one side only in 4 seconds or less    Standing Unsupported, Alternately Place Feet on Step/Stool Able to stand independently and complete 8 steps >20 seconds    Standing Unsupported, One Foot in Front Able to take small step independently and hold 30 seconds    Standing on One Leg Unable to try or needs assist to prevent fall    Total Score 47      Dynamic Gait Index   Level Surface Moderate Impairment    Change in Gait Speed Mild Impairment    Gait with Horizontal Head Turns Mild Impairment    Gait with Vertical Head Turns Mild Impairment    Gait and Pivot Turn Normal    Step Over Obstacle Mild Impairment    Step Around Obstacles Normal    Steps Moderate Impairment    Total Score 16       08/09/2024 THERAPEUTIC EXERCISE: To improve strength and endurance.   NuStep - L4 x 8 min (LE only) Gait around clinic with 270'  NEUROMUSCULAR RE-EDUCATION:  With UE support: Standing heel/toe lift x 10 B Standing hip abduction x 10 B Standing marching x 10 B Standing balance x 1 min; added EC 3x10 Sidesteps 3x down and back  PWR twist x 10    08/06/2024 THERAPEUTIC EXERCISE: To improve strength and endurance.   NuStep - L4 x 8 min (LE only)  THERAPEUTIC ACTIVITIES: To improve functional performance.  Demonstration, verbal and tactile cues throughout for technique. TUG = 19.88 sec with platform 4WW (pt misjudged seat and attempted to sit on armrest of chair), 23.06 sec w/o AD 5xSTS = 20.50 sec with B UE assist, unable w/o UE assist  NEUROMUSCULAR RE-EDUCATION: To improve balance, coordination, kinesthesia, posture, proprioception, reduce fall risk, amplitude of movement, speed of movement to reduce bradykinesia, and reduce rigidity. Seated PWR! Moves: Up x 10 Rock x 10 Twist x 10 Step x 10  PWR! Sit to stand x 7 from Airex pad in  chair Standing PWR! Moves (modified with chair in front for intermittent UE support as needed): Up x 10 Rock x 10 - one hand remaining on chair Twist x 10 - one hand remaining on chair Step x 8 - one hand remaining  on chair   07/24/2024  THERAPEUTIC EXERCISE: To improve strength, endurance, ROM, and flexibility.  Demonstration, verbal and tactile cues throughout for technique.   - resistance reduced d/t c/o fatigue Gait 270 ft around clinic with walker   NEUROMUSCULAR RE-EDUCATION: To improve coordination, kinesthesia, posture, proprioception Seated:  PWR! Up 2x10  PWR Rock 2x10 each direction  PWR Twist 2x10 each direction  PWR step x 5 each direction very difficult  Sit to stand x 5   07/19/2024  THERAPEUTIC EXERCISE: To improve strength, endurance, ROM, and flexibility.  Demonstration, verbal and tactile cues throughout for technique.  NuStep - L3 x 8 min (LE only) - resistance reduced d/t c/o fatigue Seated marching 2lb 2x10 BLE Seated LAQ 2lb 2x10 BLE Seated hamstring stretch x 30 B Seated piriformis stretch fig 4 x 30 B Gait 90 ft around clinic with walker   NEUROMUSCULAR RE-EDUCATION: To improve coordination, kinesthesia, posture, proprioception Seated diagonals with RTB x 10 B Seated rows GTB 2x10 Seated shoulder ext GTB 2x10 Sit to stands x 5 Standing ant/post WS in staggered stance x 10 B with walker   07/12/2024  THERAPEUTIC EXERCISE: To improve strength, endurance, ROM, and flexibility.  Demonstration, verbal and tactile cues throughout for technique.  NuStep - L3 x 6 min (LE only) - resistance reduced d/t c/o fatigue Seated hip hinge HS stretch 3 x 30 bil Seated hip hinge figure-4 piriformis stretch 3 x 30 bil Seated hip flexor stretch in lunge position over edge of seat 3 x 30 bil  NEUROMUSCULAR RE-EDUCATION: To improve coordination, kinesthesia, posture, proprioception, reduce fall risk, amplitude of movement, speed of movement to reduce bradykinesia, and  reduce rigidity. Seated PWR! Moves: Up x 10 Rock x 5 bil Twist - deferred d/t increased shoulder pain Step x 5 bil Seated scapular retraction into pool noodle along spine and back of chair 10 x 3 Seated GTB scapular retraction + B shoulder row 10 x 3 Seated GTB scapular retraction + B shoulder extension 10 x 3    PATIENT EDUCATION:  Education details: HEP review, HEP progression - advanced seated march to GTB, and symptoms and management of orthostatic/postural hypotension  Person educated: Patient Education method: Explanation, Demonstration, Verbal cues, Handouts, and MedBridge[employer] app updated Education comprehension: verbalized understanding, returned demonstration, verbal cues required, and needs further education  HOME EXERCISE PROGRAM: *Pt has MedBridge[employer] app access.   Access Code: [email or web address] URL: [email or web address] Date: 09/25/2024 Prepared by: Elijah Hidden  Exercises - Seated Hip Abduction with Resistance  - 1 x daily - 3 x weekly - 3 sets - 10 reps - Seated Hip Adduction Isometrics with Ball  - 1 x daily - 3 x weekly - 3 sets - 10 reps - Seated March with Resistance  - 1 x daily - 3 x weekly - 3 sets - 10 reps - Sit to Stand  - 1 x daily - 3 x weekly - 1 sets - 5 reps - Seated Hamstring Stretch  - 1-2 x daily - 7 x weekly - 3 reps - 30 sec hold - Seated Figure 4 Piriformis Stretch  - 1-2 x daily - 7 x weekly - 3 reps - 30 sec hold - Seated Hip Flexor Stretch  - 1-2 x daily - 7 x weekly - 3 reps - 30 sec hold  Patient Education - Check for Safety - Orthostatic Hypotension - Postural Hypotension  PWR Moves: - Sitting - Standing (modified)   ASSESSMENT:  CLINICAL IMPRESSION: Madix reports he has been feeling  better but still experiencing some dizziness w/o predictable trigger.  Assessed orthostatic BP today with patient demonstrating symptomatic BP drop from supine to sit and to a lesser degree from sitting to standing.  Provided education on  orthostatic/postural hypotension including symptoms, causes and strategies to minimize symptoms and reduce risk for falls.  He does report improving activity tolerance - he was able to walk through the grocery store over the weekend but it took him to the limit of his endurance.  Reviewed and progressed LE strengthening exercises from HEP and will plan to continued to build on these in upcoming visits.  Eual will benefit from continued skilled PT to address ongoing flexibility, strength, and balance deficits to improve mobility and activity tolerance with decreased pain interference and decreased risk for falls.    OBJECTIVE IMPAIRMENTS: Abnormal gait, decreased activity tolerance, decreased balance, decreased coordination, decreased endurance, decreased knowledge of condition, decreased knowledge of use of DME, decreased mobility, difficulty walking, decreased strength, decreased safety awareness, dizziness, impaired perceived functional ability, impaired flexibility, improper body mechanics, postural dysfunction, and pain.   ACTIVITY LIMITATIONS: carrying, lifting, bending, standing, squatting, stairs, transfers, bed mobility, reach over head, locomotion level, and caring for others  PARTICIPATION LIMITATIONS: meal prep, cleaning, laundry, shopping, and community activity  PERSONAL FACTORS: Age, Fitness, Past/current experiences, Time since onset of injury/illness/exacerbation, and 3+ comorbidities: Anxiety, asthma, bipolar affective disorder, COPD, depression, DM-II, DJD, chronic knee pain, B TKA, GERD, HTN, Mnire's disease, obesity, pancreatitis, h/o CVA, neurologic dysphagia, tardive dyskinesia, HOH - L deaf & R hearing aid are also affecting patient's functional outcome.   REHAB POTENTIAL: Good  CLINICAL DECISION MAKING: Unstable/unpredictable  EVALUATION COMPLEXITY: High   GOALS: Goals reviewed with patient? Yes  SHORT TERM GOALS: Target date: 07/30/2024, extended to  10/04/2024  Patient will be independent with initial HEP. Baseline: To be established 07/05/24 - consistent with current HEP  07/12/24 - clarified HEP frequency Goal status: MET - 08/06/24   2.  Patient will demonstrate decreased fall risk by scoring < 13.5 sec on normal TUG. Baseline: 15.84 sec w/o AD 08/06/24 - 19.88 sec with platform 4WW (pt misjudged seat and attempted to sit on armrest of chair), 23.06 sec w/o AD 08/13/24 - 21.01 sec with platform 4WW Goal status: IN PROGRESS - 09/05/24 - 40.50 sec with platform 4WW  3.  Patient will be educated on strategies to decrease risk of falls.  Baseline:  Goal status: MET - 07/09/24   LONG TERM GOALS: Target date: 09/10/2024, extended to 11/01/2024  Patient will be independent with ongoing/advanced HEP for self-management at home incorporating PWR! Moves as indicated .  Baseline:  08/13/24 - met for current HEP  Goal status: IN PROGRESS - 09/25/24 - HEP reviewed and updated  2.  Patient will be able to ambulate 600' with LRAD with good safety to access community.  Baseline:  Goal status: IN PROGRESS - 08/09/24 - 270' with standing 4WW  3.  Patient will be able to step up/down curb safely with LRAD for safety with community ambulation.  Baseline:  Goal status: IN PROGRESS - 08/16/24 - pt reports he does not attempt curbs with platform 4WW as walker too heavy to navigate up curb   4.  Patient will demonstrate gait speed of >/= 2.62 ft/sec (0.8 m/s) to be a safe limited community ambulator with decreased risk for recurrent falls.  Baseline: 1.80 ft/sec w/o AD, 1.98 ft/sec with SPC and 2.22 ft/sec with platform RW 07/19/24 - 2.26 ft/sec 08/16/24 - 2.16  ft/sec (normal speed), 2.37 ft/sec (fastest comfortable speed) with upright/platform 4WW Goal status: IN PROGRESS - 09/06/24 - 1.15 ft/sec with platform 4WW  5.  Patient will improve 5x STS time to </= 15 seconds w/o need for UE assist to demonstrate improved functional strength and transfer  efficiency. Baseline: unable w/o UE assist - only able to complete 4 reps in 34.03 sec; 20.18 sec with B UE assist 08/06/24 - 20.50 sec with B UE assist Goal status: IN PROGRESS - 09/06/24 - 32.06 sec with B UE assist  6.  Patient will demonstrate at least 19/24 on DGI to improve gait stability and reduce risk for falls. Baseline: 12/24 (06/26/24) Goal status: IN PROGRESS - 08/13/24 - 16/24  7.  Patient will improve Berg score by at least 8 points to improve safety and stability with ADLs in standing and reduce risk for falls. (MCID = 8 points)   Baseline: 41/56 (06/26/24) Goal status: IN PROGRESS - 08/13/24 - 47/56  8.  Patient will report >/= 52% on ABC scale to demonstrate improved balance confidence and decreased risk for falls. Baseline: 620 / 1600 = 38.8 % 08/13/24 - 810 / 1600 = 50.6 % Goal status: IN PROGRESS - 09/06/24 - 160 / 1600 = 10.0 %  9. Patient will verbalize understanding of local Parkinson's disease community resources, including community fitness post d/c. Baseline:  Goal status: IN PROGRESS - 08/16/24 - handouts provided   PLAN:  PT FREQUENCY: 2x/week  PT DURATION: 12 weeks  PLANNED INTERVENTIONS: [id number] PT Re-evaluation, 97750- Physical Performance Testing, 97110-Therapeutic exercises, 97530- Therapeutic activity, [id number]- Neuromuscular re-education, 97535- Self Care, 02859- Manual therapy, [id number]- Gait training, [id number]- Canalith repositioning, [id number]- Electrical stimulation (unattended), 97035- Ultrasound, 02966- Ionotophoresis 4mg /ml Dexamethasone , 79439 (1-2 muscles), 20561 (3+ muscles)- Dry Needling, Patient/Family education, Balance training, Stair training, Taping, Joint mobilization, Vestibular training, DME instructions, Cryotherapy, and Moist heat  PLAN FOR NEXT SESSION: continue LE strengthening; balance activities (both static and dynamic); PWR! Moves    Elijah CHRISTELLA Hidden, [place]  09/25/2024, 2:02 PM       Date of referral: 05/31/24 Referring provider: Amon Aloysius BRAVO, MD Referring diagnosis?  G21.11,T43.505A (ICD-10-CM) - Neuroleptic-induced parkinsonism (HCC)  R26.9 (ICD-10-CM) - Gait disorder  R29.6 (ICD-10-CM) - Multiple falls   Treatment diagnosis? (if different than referring diagnosis)  Muscle weakness (generalized)  Unsteadiness on feet  Other abnormalities of gait and mobility  History of falling  What was this (referring dx) caused by? Ongoing Issue and Other: neurodegenerative disease  Nature of Condition: Chronic (continuous duration > 3 months)   Laterality: Both  Current Functional Measure Score: Other ABC Scale: 810 / 1600 = 50.6 %, indicating a moderate level of physical functioning (<69% indicates risk for recurrent falls in PD)    Objective measurements identify impairments when they are compared to normal values, the uninvolved extremity, and prior level of function.  [x]  Yes  []  No  Objective assessment of functional ability: Moderate functional limitations   Briefly describe symptoms:  Efosa is progressing with PT demonstrating improving tolerance for standing activities, improving gait safety and speed with his platform 4WW, and decreasing fall risk per improvements in Berg from 41/56 to 47/56 (indicating a reduction in fall risk from significant (greater than 80%) to moderate moderate (>50%) and DGI from 12/24 to 16/24 (scores of <19/24 still indicating high risk for falls) today.  He has not had any falls in the past month.  [place] will benefit from continued skilled PT to address ongoing motor  control, strength and balance deficits to improve mobility and activity tolerance with decreased pain interference and decreased risk for falls.   How did symptoms start: gradual onset  Average pain intensity:  Last 24 hours: 2/10  Past week: 2/10  How often does the pt experience symptoms? Frequently  How much have the symptoms interfered with usual daily activities? Moderately  How has condition changed since care began at  this facility? A little better   In general, how is the patients overall health? Fair  Onset date: Parkinsonism diagnosis in 2013, worsening weakness over the past 6 months    BACK PAIN (STarT Back Screening Tool) - (When applicable):   Has your back pain spread down your leg(s) at sometime in the last 2 weeks? []  Yes   [x]  No Have you had pain in the shoulder or neck at sometime in the past 2 weeks? [x]  Yes   []  No Have you only walked short distances because of your back pain? []  Yes   [x]  No In the past 2 weeks, have you dressed more slowly than usual because of your back pain? []  Yes   [x]  No Do you think it is not really safe for person with a condition like yours to be physically active? []  Yes   [x]  No Have worrying thoughts been going through your mind a lot of the time? [x]  Yes   []  No Do you feel that your back pain is terrible and it is never going to get any better? []  Yes   [x]  No In general, have you stopped enjoying all the things you usually enjoy? [x]  Yes   []  No Overall, how bothersome has your back pain been in the last 2 weeks? []  Not at all   [x]  Slightly     []  Moderate   []  Very much     []  Extremely

## 2024-09-27 ENCOUNTER — Ambulatory Visit: Admitting: Physical Therapy

## 2024-09-27 ENCOUNTER — Encounter: Payer: Self-pay | Admitting: Physical Therapy

## 2024-09-27 DIAGNOSIS — R2689 Other abnormalities of gait and mobility: Secondary | ICD-10-CM

## 2024-09-27 DIAGNOSIS — M6281 Muscle weakness (generalized): Secondary | ICD-10-CM

## 2024-09-27 DIAGNOSIS — R2681 Unsteadiness on feet: Secondary | ICD-10-CM

## 2024-09-27 DIAGNOSIS — Z9181 History of falling: Secondary | ICD-10-CM

## 2024-09-27 NOTE — Therapy (Signed)
 OUTPATIENT PHYSICAL THERAPY TREATMENT     Patient Name: Ryan Zhang MRN: [id number] DOB:07/25/1946, 78 y.o., male Today's Date: 09/27/2024   END OF SESSION:  PT End of Session - 09/27/24 1107     Visit Number 16    Date for Recertification  11/01/24    Authorization Type [employer]    Authorization Time Period 09/06/24- 10/04/24    Authorization - Visit Number 5    Authorization - Number of Visits 8    Progress Note Due on Visit 22   Recert/PN on visit #12 (09/06/24)   PT Start Time 1107    PT Stop Time 1146    PT Time Calculation (min) 39 min    Activity Tolerance Patient limited by fatigue    Behavior During Therapy WFL for tasks assessed/performed                 Past Medical History:  Diagnosis Date   Allergy    allergy shots Dr. Cloretta   Anemia    Anxiety    Asthma    moderate persistant   Bipolar affective (HCC)    Cataract    both eyes   Clotting disorder    Due to blood thinner   COPD (chronic obstructive pulmonary disease) (HCC)    Depression    Diabetes mellitus    DJD (degenerative joint disease)    Dysphagia    Gallstones    hx of, s/p cholecystectomy   GERD (gastroesophageal reflux disease)    Hepatitis A    as teenager 60's   Hollenhorst plaque    right eye   Hyperlipidemia    Hypertension    Kidney stones    hx of   Meniere's disease    Motility disorder, esophageal    Obesity    Pancreatitis    Personal history of colonic polyps 11/2004   hyperplastic.   Stroke [facility])    per MRI   Past Surgical History:  Procedure Laterality Date   CATARACT EXTRACTION Bilateral 09/2015 and 10/2015   CHOLECYSTECTOMY     COLONOSCOPY     EYE SURGERY     JOINT REPLACEMENT     TOTAL KNEE ARTHROPLASTY Bilateral    both knees   Patient Active Problem List   Diagnosis Date Noted   Hypomagnesemia 09/08/2024   Tinea cruris 07/24/2020   PCP NOTES >>>>>>>>>>>>>>>>>>>>>>>>>>>>>>> 01/12/2016   Folliculitis 05/09/2015   Hearing loss in  right ear 08/05/2014   Panic attack as reaction to stress 04/20/2012   Dysphagia, neurologic 01/07/2012   Morbid obesity (HCC) 01/07/2012   Chest pain, musculoskeletal 12/07/2011   Candidiasis of skin 08/12/2011   Annual physical exam 06/07/2011   OLECRANON BURSITIS 01/29/2011   PARTIAL ARTERIAL OCCLUSION OF RETINA 05/27/2010   Essential hypertension, benign 01/16/2010   ABNORMAL ELECTROCARDIOGRAM 01/14/2010   Asthma 10/14/2009   GERD 10/14/2009   Bipolar disorder (HCC) 02/20/2009   PHIMOSIS 02/20/2009   KNEE PAIN, CHRONIC 11/20/2008   HIP PAIN, RIGHT 08/19/2008   DM type 2 with diabetic peripheral neuropathy (HCC) 02/22/2007   Hyperlipidemia associated with type 2 diabetes mellitus (HCC) 02/22/2007   ALLERGIC RHINITIS, SEASONAL 02/22/2007   LOW BACK PAIN, CHRONIC 02/22/2007   MENIERE'S DISEASE, HX OF 02/22/2007    PCP: Amon Aloysius BRAVO, MD   REFERRING PROVIDER: Amon Aloysius BRAVO, MD  (Neurologist - Evonnie Stabs, MD)  REFERRING DIAG:  [phone] (ICD-10-CM) - Neuroleptic-induced parkinsonism (HCC)  R26.9 (ICD-10-CM) - Gait disorder  R29.6 (ICD-10-CM) - Multiple falls  THERAPY DIAG:  Muscle weakness (generalized)  Unsteadiness on feet  Other abnormalities of gait and mobility  History of falling  RATIONALE FOR EVALUATION AND TREATMENT: Rehabilitation  ONSET DATE: ~2013 - 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  No further episodes of the dizziness.  PAIN: Are you having pain? No  PERTINENT HISTORY:  Anxiety, asthma, bipolar affective disorder, COPD, depression, DM-II, DJD, chronic knee pain, B TKA, GERD, HTN, Mnire's  disease, obesity, pancreatitis, h/o CVA, neurologic dysphagia, tardive dyskinesia, HOH - L deaf & R hearing aid  PRECAUTIONS: Fall  RED FLAGS: None  WEIGHT BEARING RESTRICTIONS: No  FALLS:  Has patient fallen in last 6 months? No and before the levodopa  supplements, falls were daily (sometimes several times a day)  LIVING ENVIRONMENT: Lives with: lives with their spouse and dog Lives in: Apartment Stairs: No Has following equipment at home: Single point cane, Environmental Consultant - 2 wheeled, shower chair, and Grab bars (platform RW)  OCCUPATION: Retired  PLOF: Independent, Independent with community mobility with device, and Leisure: watching TV, listening to books, programming, walking ~15 min (around the block) - tries to do daily   PATIENT GOALS: To get back to the point where I can walk to the car unassisted (no AD) and use platform RW less in community.  Be able to assist my wife with bringing groceries in from the car.   OBJECTIVE: (objective measures completed at initial evaluation unless otherwise dated)  DIAGNOSTIC FINDINGS:  N/A - Multiple imaging studies in 2024 s/p falls but no acute fractures.  COGNITION: Overall cognitive status: Impaired and History of cognitive impairments - at baseline - mostly memory issues resulting from ECT 19 yrs ago, better over last 3 months but still notes limited STM   SENSATION: WFL  COORDINATION: B Heel/toe WFL Heel to shin unable bilaterally   POSTURE:  rounded shoulders, forward head, and flexed trunk   MUSCLE LENGTH: Hamstrings: mod/severe tight B Piriformis: mild /mod tight R>L Hip flexors: mod tight   LOWER EXTREMITY ROM:    Grossly WFL except limited hip extension and rotation bilaterally  LOWER EXTREMITY MMT:    MMT Right eval Left eval R 09/06/24 L 09/06/24  Hip flexion 3+  3+ 3+ 3+  Hip extension 3+ 3+ 3+ 3+  Hip abduction 3- 3 3- 3-  Hip adduction 3+ 3+ 3- 3-  Hip internal rotation 4- 3+ 4- 4-  Hip external rotation  4- 3+ 4- 4-  Knee flexion 4 4 4- 4  Knee extension 4 4 4- 4  Ankle dorsiflexion 4- 4- 4- 4  Ankle plantarflexion      Ankle inversion      Ankle eversion      (Blank rows = not tested)  BED MOBILITY:  SBA  TRANSFERS: Assistive device utilized: None  Sit to stand: Modified independence and SBA Stand to sit: Modified independence and SBA Chair to chair: SBA Floor: NT  GAIT: Distance walked: clinic distances Assistive device utilized: Single point cane, platform RW/2WW, and None Level of assistance: SBA Gait pattern: step through pattern, decreased arm swing- Right, decreased arm swing- Left, decreased stride length, decreased hip/knee flexion- Right, decreased hip/knee flexion- Left, decreased ankle dorsiflexion- Right, decreased ankle dorsiflexion- Left, shuffling, festinating, trunk flexed, poor foot clearance- Right, and poor foot clearance- Left Comments: gait deviations more pronounced with decreasing AD support  FUNCTIONAL TESTS:  5 times sit to stand: unable w/o UE assist - only able to complete 4 reps in 34.03 sec; 20.18 sec with B UE assist Timed up and go (TUG): Normal - 15.84 sec w/o AD, Manual - 15.19 sec w/o AD, Cognitive - 15.66 sec w/o AD 10 meter walk test: 18.25 sec w/o AD, 16.56 sec with SPC, 14.78 sec with platform RW Gait speed: 1.80 ft/sec w/o AD, 1.98 ft/sec with SPC, 2.22 ft/sec with platform RW Berg balance scale: 41/56, 37-45 indicates significant (>80%) risk for falls (06/26/24) DGI: 12/24, <19/24 indicates high risk for falls (06/26/24)  PATIENT SURVEYS:  ABC scale: 620 / 1600 = 38.8 %, indicating a low level of physical functioning (<69% indicates risk for recurrent falls in PD) 09/06/24 - ABC scale: 09/06/24 - 160 / 1600 = 10.0 %   TODAY'S TREATMENT:   09/27/2024 THERAPEUTIC EXERCISE: To improve strength and endurance.  Demonstration, verbal and tactile cues throughout for technique.  NuStep - L6 x 6 min (LE only) Seated alt LAQ 2 x  10  THERAPEUTIC ACTIVITIES: To improve functional performance.  Demonstration, verbal and tactile cues throughout for technique. Standing at wall ladder for support: Alt hip extension 2 x 10 Alt hip ABD 2 x 10 Alt hip flexion march 2 x 10 Alt knee flexion/HS curl 2 x 10 Heel-toe raises 2 x 10 Mini-squat 2 x 10 = 17.07 sec with platform 4WW Gait speed = 1.92 ft/sec with platform 4WW TUG = 26.93 sec with platform 4WW Alt fwd step-over and back using yardstick as target to increase step length and foot clearance - single UE support on counter for balance  GAIT TRAINING: To normalize gait pattern and improve safety with platform 4WW.  400' with platform [id number] - cues for increased stride length and increased hip and knee flexion to reduce shuffling with gait and reduce tripping hazard   09/25/2024  THERAPEUTIC EXERCISE: To improve strength and endurance.  Demonstration, verbal and tactile cues throughout for technique.  NuStep - L5 x 6 min (started with B UE/LE, but shifted to LE only d/t irritating UE pain) Seated GTB B hip ABD/ER clam 2 x 10 Seated GTB alt hip flexion march 2 x 10  THERAPEUTIC ACTIVITIES: To improve functional performance.  Demonstration, verbal and tactile cues throughout for technique. Orthostatic BP assessment:  Time BP  HR Symptoms  Lying down 5 min 132/70 84 No dizziness  Sitting 1-2 min 112/60 83 Spinning dizziness  Standing 1-2 min 104/56 91 Spinning dizziness  Sit to stand w/o UE assist from slightly elevated treatment table focusing on increased fwd weight shift to reduce posterior LOB.  SELF CARE: Provided education to reduce fall risk.  Provided education on orthostatic/postural hypotension including symptoms, causes and strategies to minimize symptoms and reduce risk for falls: Need for adequate hydration  Slow transitions with pause to ensure no dizziness or that dizziness has resolved Gentle LE exercises before initiation of  movement   09/19/24 THERAPEUTIC EXERCISE: To improve strength and endurance.  Demonstration, verbal and tactile cues throughout for technique. NuStep - L5 x 6 min (LE only) Seated LAQ BLE  10 Seated marching BLE x 10  NEUROMUSCULAR RE-EDUCATION:  Berg Balance Test  Sit to Stand Able to stand without using hands and stabilize independently   Standing Unsupported Able to stand safely 2 minutes   Sitting with Back Unsupported but Feet Supported on Floor or Stool Able to sit safely and securely 2 minutes   Stand to Sit Sits safely with minimal use of hands   Transfers Able to transfer safely, minor use of hands   Standing Unsupported with Eyes Closed Able to stand 10 seconds safely   Standing Unsupported with Feet Together Able to place feet together independently and stand 1 minute safely   From Standing, Reach Forward with Outstretched Arm Can reach confidently >25 cm (10)   From Standing Position, Pick up Object from Floor Able to pick up shoe safely and easily   From Standing Position, Turn to Look Behind Over each Shoulder Looks behind from both sides and weight shifts well   Turn 360 Degrees Able to turn 360 degrees safely but slowly   Standing Unsupported, Alternately Place Feet on Step/Stool Needs assistance to keep from falling or unable to try   Standing Unsupported, One Foot in Front Needs help to step but can hold 15 seconds   Standing on One Leg Unable to try or needs assist to prevent fall   Total Score 43   Interpretation: 37-45 indicates significant (>80%) risk for falls (06/26/24)      09/18/24 THERAPEUTIC EXERCISE: To improve strength and endurance.  Demonstration, verbal and tactile cues throughout for technique. NuStep - L5 x 6 min (LE only) NEUROMUSCULAR RE-EDUCATION:  Seated on dynadisk hands on table for support: Sitting balance LAQ x 5 BLE- very difficult Perturbations 4 ways Marching x 5 BLE  Gait training:  270 with roller walker - some R foot drag, very  short steps B  Sit to stands with chair in front very challenging 3x lots of effort standing even with support  09/06/2024 - Recertification THERAPEUTIC ACTIVITIES: To improve functional performance.  Demonstration, verbal and tactile cues throughout for technique. LE MMT 5xSTS = 32.06 sec with B UE assist TUG = 40.50 sec with platform 4WW ABC Scale: 09/06/24 - 160 / 1600 = 10.0 % = 28.63 sec with platform 4WW Gait speed = 1.15 ft/sec with platform 4WW Goal assessment   08/16/2024  THERAPEUTIC EXERCISE: To improve strength and endurance.  Demonstration, verbal and tactile cues throughout for technique. NuStep - L5 x 6 min (LE only)  THERAPEUTIC ACTIVITIES: To improve functional performance.  Demonstration, verbal and tactile cues throughout for technique. with upright/platform 4WW = 15.16 sec (normal speed), 13.84 sec (fastest comfortable speed) Gait speed with upright/platform 4WW = 2.16 ft/sec (  normal speed), 2.37 ft/sec (fastest comfortable speed)  SELF CARE:  Provided education on community resources related to Parkinson's including support group and educational sessions, community-based PWR! Moves and exercise/movement groups as well as online resources related to PD.   NEUROMUSCULAR RE-EDUCATION: To improve balance, coordination, kinesthesia, posture, proprioception, reduce fall risk, amplitude of movement, speed of movement to reduce bradykinesia, and reduce rigidity.  Standing between backs of 2 chair for UE support with SBA of PT: PWR! Step forward x 10 bil - min A x 1 for posterior LOB PWR! Step backward x 10 bil PWR! Step through forward & backward x 10 bil  GAIT TRAINING: To normalize gait pattern and prepare to wean to LRAD. 40 ft with B hiking poles and CGA of PT via gait belt - cues for reciprocal sequencing with pt having difficulty achieving normal step pattern   08/13/24 Nustep L5x59min LE only ABC scale: 810 / 1600 = 50.6 % TUG = 21.01 sec with  platform 4WW Completed BERG & DGI -- see below  Berg Balance Test   Sit to Stand Able to stand  independently using hands    Standing Unsupported Able to stand safely 2 minutes    Sitting with Back Unsupported but Feet Supported on Floor or Stool Able to sit safely and securely 2 minutes    Stand to Sit Sits safely with minimal use of hands    Transfers Able to transfer safely, minor use of hands    Standing Unsupported with Eyes Closed Able to stand 10 seconds safely    Standing Unsupported with Feet Together Able to place feet together independently and stand 1 minute safely    From Standing, Reach Forward with Outstretched Arm Can reach confidently >25 cm (10)    From Standing Position, Pick up Object from Floor Able to pick up shoe safely and easily    From Standing Position, Turn to Look Behind Over each Shoulder Looks behind from both sides and weight shifts well    Turn 360 Degrees Able to turn 360 degrees safely one side only in 4 seconds or less    Standing Unsupported, Alternately Place Feet on Step/Stool Able to stand independently and complete 8 steps >20 seconds    Standing Unsupported, One Foot in Front Able to take small step independently and hold 30 seconds    Standing on One Leg Unable to try or needs assist to prevent fall    Total Score 47      Dynamic Gait Index   Level Surface Moderate Impairment    Change in Gait Speed Mild Impairment    Gait with Horizontal Head Turns Mild Impairment    Gait with Vertical Head Turns Mild Impairment    Gait and Pivot Turn Normal    Step Over Obstacle Mild Impairment    Step Around Obstacles Normal    Steps Moderate Impairment    Total Score 16       08/09/2024 THERAPEUTIC EXERCISE: To improve strength and endurance.   NuStep - L4 x 8 min (LE only) Gait around clinic with 270'  NEUROMUSCULAR RE-EDUCATION:  With UE support: Standing heel/toe lift x 10 B Standing hip abduction x 10 B Standing marching x 10 B Standing  balance x 1 min; added EC 3x10 Sidesteps 3x down and back  PWR twist x 10    08/06/2024 THERAPEUTIC EXERCISE: To improve strength and endurance.   NuStep - L4 x 8 min (LE only)  THERAPEUTIC ACTIVITIES: To improve functional performance.  Demonstration, verbal and tactile cues throughout for technique. TUG = 19.88 sec with platform 4WW (pt misjudged seat and attempted to sit on armrest of chair), 23.06 sec w/o AD 5xSTS = 20.50 sec with B UE assist, unable w/o UE assist  NEUROMUSCULAR RE-EDUCATION: To improve balance, coordination, kinesthesia, posture, proprioception, reduce fall risk, amplitude of movement, speed of movement to reduce bradykinesia, and reduce rigidity. Seated PWR! Moves: Up x 10 Rock x 10 Twist x 10 Step x 10  PWR! Sit to stand x 7 from Airex pad in chair Standing PWR! Moves (modified with chair in front for intermittent UE support as needed): Up x 10 Rock x 10 - one hand remaining on chair Twist x 10 - one hand remaining on chair Step x 8 - one hand remaining on chair   07/24/2024  THERAPEUTIC EXERCISE: To improve strength, endurance, ROM, and flexibility.  Demonstration, verbal and tactile cues throughout for technique.   - resistance reduced d/t c/o fatigue Gait 270 ft around clinic with walker   NEUROMUSCULAR RE-EDUCATION: To improve coordination, kinesthesia, posture, proprioception Seated:  PWR! Up 2x10  PWR Rock 2x10 each direction  PWR Twist 2x10 each direction  PWR step x 5 each direction very difficult  Sit to stand x 5   07/19/2024  THERAPEUTIC EXERCISE: To improve strength, endurance, ROM, and flexibility.  Demonstration, verbal and tactile cues throughout for technique.  NuStep - L3 x 8 min (LE only) - resistance reduced d/t c/o fatigue Seated marching 2lb 2x10 BLE Seated LAQ 2lb 2x10 BLE Seated hamstring stretch x 30 B Seated piriformis stretch fig 4 x 30 B Gait 90 ft around clinic with walker   NEUROMUSCULAR RE-EDUCATION: To improve  coordination, kinesthesia, posture, proprioception Seated diagonals with RTB x 10 B Seated rows GTB 2x10 Seated shoulder ext GTB 2x10 Sit to stands x 5 Standing ant/post WS in staggered stance x 10 B with walker   07/12/2024  THERAPEUTIC EXERCISE: To improve strength, endurance, ROM, and flexibility.  Demonstration, verbal and tactile cues throughout for technique.  NuStep - L3 x 6 min (LE only) - resistance reduced d/t c/o fatigue Seated hip hinge HS stretch 3 x 30 bil Seated hip hinge figure-4 piriformis stretch 3 x 30 bil Seated hip flexor stretch in lunge position over edge of seat 3 x 30 bil  NEUROMUSCULAR RE-EDUCATION: To improve coordination, kinesthesia, posture, proprioception, reduce fall risk, amplitude of movement, speed of movement to reduce bradykinesia, and reduce rigidity. Seated PWR! Moves: Up x 10 Rock x 5 bil Twist - deferred d/t increased shoulder pain Step x 5 bil Seated scapular retraction into pool noodle along spine and back of chair 10 x 3 Seated GTB scapular retraction + B shoulder row 10 x 3 Seated GTB scapular retraction + B shoulder extension 10 x 3    PATIENT EDUCATION:  Education details: HEP review, HEP progression - advanced seated march to GTB, and symptoms and management of orthostatic/postural hypotension  Person educated: Patient Education method: Explanation, Demonstration, Verbal cues, Handouts, and MedBridge[employer] app updated Education comprehension: verbalized understanding, returned demonstration, verbal cues required, and needs further education  HOME EXERCISE PROGRAM: *Pt has MedBridge[employer] app access.   Access Code: [email or web address] URL: [email or web address] Date: 09/25/2024 Prepared by: Elijah Hidden  Exercises - Seated Hip Abduction with Resistance  - 1 x daily - 3 x weekly - 3 sets - 10 reps - Seated Hip Adduction Isometrics with Ball  - 1 x daily - 3 x weekly - 3 sets - 10  reps - Seated March with Resistance  - 1 x daily  - 3 x weekly - 3 sets - 10 reps - Sit to Stand  - 1 x daily - 3 x weekly - 1 sets - 5 reps - Seated Hamstring Stretch  - 1-2 x daily - 7 x weekly - 3 reps - 30 sec hold - Seated Figure 4 Piriformis Stretch  - 1-2 x daily - 7 x weekly - 3 reps - 30 sec hold - Seated Hip Flexor Stretch  - 1-2 x daily - 7 x weekly - 3 reps - 30 sec hold  Patient Education - Check for Safety - Orthostatic Hypotension - Postural Hypotension  PWR Moves: - Sitting - Standing (modified)   ASSESSMENT:  CLINICAL IMPRESSION: Jermichael denies pain today and has not experienced any of the dizzy episodes since his last PT visit.  Briefly reviewed strategies to manage symptoms of orthostatic hypotension and reduce fall risk related to BP changes.  Progressed strengthening today to include more standing exercises to promote increased activity tolerance and facilitate improved core and LE strengthening for improved balance and decreased fall risk.  Gait speed improving to 1.92 ft/sec with platform 4WW but still shy of speeds achieved before recent decline in function, with gait pattern demonstrating short shuffling stride with decreased hip and knee flexion.  Therapeutic activities and gait training focusing on improving foot clearance and step length to promote more normal gait pattern and reduce tripping hazard.  Beryl will benefit from continued skilled PT to address ongoing flexibility, strength, and balance deficits to improve mobility and activity tolerance with decreased pain interference and decreased risk for falls.    OBJECTIVE IMPAIRMENTS: Abnormal gait, decreased activity tolerance, decreased balance, decreased coordination, decreased endurance, decreased knowledge of condition, decreased knowledge of use of DME, decreased mobility, difficulty walking, decreased strength, decreased safety awareness, dizziness, impaired perceived functional ability, impaired flexibility, improper body mechanics, postural dysfunction, and  pain.   ACTIVITY LIMITATIONS: carrying, lifting, bending, standing, squatting, stairs, transfers, bed mobility, reach over head, locomotion level, and caring for others  PARTICIPATION LIMITATIONS: meal prep, cleaning, laundry, shopping, and community activity  PERSONAL FACTORS: Age, Fitness, Past/current experiences, Time since onset of injury/illness/exacerbation, and 3+ comorbidities: Anxiety, asthma, bipolar affective disorder, COPD, depression, DM-II, DJD, chronic knee pain, B TKA, GERD, HTN, Mnire's disease, obesity, pancreatitis, h/o CVA, neurologic dysphagia, tardive dyskinesia, HOH - L deaf & R hearing aid are also affecting patient's functional outcome.   REHAB POTENTIAL: Good  CLINICAL DECISION MAKING: Unstable/unpredictable  EVALUATION COMPLEXITY: High   GOALS: Goals reviewed with patient? Yes  SHORT TERM GOALS: Target date: 07/30/2024, extended to 10/04/2024  Patient will be independent with initial HEP. Baseline: To be established 07/05/24 - consistent with current HEP  07/12/24 - clarified HEP frequency Goal status: MET - 08/06/24   2.  Patient will demonstrate decreased fall risk by scoring < 13.5 sec on normal TUG. Baseline: 15.84 sec w/o AD 08/06/24 - 19.88 sec with platform 4WW (pt misjudged seat and attempted to sit on armrest of chair), 23.06 sec w/o AD 08/13/24 - 21.01 sec with platform 4WW 09/05/24 - 40.50 sec with platform 4WW Goal status: IN PROGRESS - 09/27/24 - 26.93 sec with platform 4WW  3.  Patient will be educated on strategies to decrease risk of falls.  Baseline:  Goal status: MET - 07/09/24   LONG TERM GOALS: Target date: 09/10/2024, extended to 11/01/2024  Patient will be independent with ongoing/advanced HEP for self-management at home incorporating  PWR! Moves as indicated .  Baseline:  08/13/24 - met for current HEP  Goal status: IN PROGRESS - 09/25/24 - HEP reviewed and updated  2.  Patient will be able to ambulate 600' with LRAD with good  safety to access community.  Baseline:  Goal status: IN PROGRESS - 08/09/24 - 270' with standing 4WW  3.  Patient will be able to step up/down curb safely with LRAD for safety with community ambulation.  Baseline:  Goal status: IN PROGRESS - 08/16/24 - pt reports he does not attempt curbs with platform 4WW as walker too heavy to navigate up curb   4.  Patient will demonstrate gait speed of >/= 2.62 ft/sec (0.8 m/s) to be a safe limited community ambulator with decreased risk for recurrent falls.  Baseline: 1.80 ft/sec w/o AD, 1.98 ft/sec with SPC and 2.22 ft/sec with platform RW 07/19/24 - 2.26 ft/sec 08/16/24 - 2.16 ft/sec (normal speed), 2.37 ft/sec (fastest comfortable speed) with upright/platform 4WW 09/06/24 - 1.15 ft/sec with platform 4WW Goal status: IN PROGRESS - 09/27/24 - 1.92 ft/sec with platform 4WW  5.  Patient will improve 5x STS time to </= 15 seconds w/o need for UE assist to demonstrate improved functional strength and transfer efficiency. Baseline: unable w/o UE assist - only able to complete 4 reps in 34.03 sec; 20.18 sec with B UE assist 08/06/24 - 20.50 sec with B UE assist Goal status: IN PROGRESS - 09/06/24 - 32.06 sec with B UE assist  6.  Patient will demonstrate at least 19/24 on DGI to improve gait stability and reduce risk for falls. Baseline: 12/24 (06/26/24) Goal status: IN PROGRESS - 08/13/24 - 16/24  7.  Patient will improve Berg score by at least 8 points to improve safety and stability with ADLs in standing and reduce risk for falls. (MCID = 8 points)   Baseline: 41/56 (06/26/24) Goal status: IN PROGRESS - 08/13/24 - 47/56  8.  Patient will report >/= 52% on ABC scale to demonstrate improved balance confidence and decreased risk for falls. Baseline: 620 / 1600 = 38.8 % 08/13/24 - 810 / 1600 = 50.6 % Goal status: IN PROGRESS - 09/06/24 - 160 / 1600 = 10.0 %  9. Patient will verbalize understanding of local Parkinson's disease community resources, including  community fitness post d/c. Baseline:  Goal status: IN PROGRESS - 08/16/24 - handouts provided   PLAN:  PT FREQUENCY: 2x/week  PT DURATION: 12 weeks  PLANNED INTERVENTIONS: 97164- PT Re-evaluation, 97750- Physical Performance Testing, 97110-Therapeutic exercises, 97530- Therapeutic activity, [id number]- Neuromuscular re-education, 97535- Self Care, 02859- Manual therapy, [id number]- Gait training, [phone]- Canalith repositioning, [id number]- Electrical stimulation (unattended), 97035- Ultrasound, 02966- Ionotophoresis 4mg /ml Dexamethasone , 79439 (1-2 muscles), 20561 (3+ muscles)- Dry Needling, Patient/Family education, Balance training, Stair training, Taping, Joint mobilization, Vestibular training, DME instructions, Cryotherapy, and Moist heat  PLAN FOR NEXT SESSION: continue LE strengthening; balance activities (both static and dynamic); PWR! Moves    Elijah CHRISTELLA Hidden, [place]  09/27/2024, 7:16 PM       Date of referral: 05/31/24 Referring provider: Amon Aloysius BRAVO, MD Referring diagnosis?  G21.11,T43.505A (ICD-10-CM) - Neuroleptic-induced parkinsonism (HCC)  R26.9 (ICD-10-CM) - Gait disorder  R29.6 (ICD-10-CM) - Multiple falls   Treatment diagnosis? (if different than referring diagnosis)  Muscle weakness (generalized)  Unsteadiness on feet  Other abnormalities of gait and mobility  History of falling  What was this (referring dx) caused by? Ongoing Issue and Other: neurodegenerative disease  Nature of Condition: Chronic (continuous duration > 3  months)   Laterality: Both  Current Functional Measure Score: Other ABC Scale: 810 / 1600 = 50.6 %, indicating a moderate level of physical functioning (<69% indicates risk for recurrent falls in PD)    Objective measurements identify impairments when they are compared to normal values, the uninvolved extremity, and prior level of function.  [x]  Yes  []  No  Objective assessment of functional ability: Moderate functional limitations   Briefly  describe symptoms:  Kavon is progressing with PT demonstrating improving tolerance for standing activities, improving gait safety and speed with his platform 4WW, and decreasing fall risk per improvements in Berg from 41/56 to 47/56 (indicating a reduction in fall risk from significant (greater than 80%) to moderate moderate (>50%) and DGI from 12/24 to 16/24 (scores of <19/24 still indicating high risk for falls) today.  He has not had any falls in the past month.  Jaimen will benefit from continued skilled PT to address ongoing motor control, strength and balance deficits to improve mobility and activity tolerance with decreased pain interference and decreased risk for falls.   How did symptoms start: gradual onset  Average pain intensity:  Last 24 hours: 2/10  Past week: 2/10  How often does the pt experience symptoms? Frequently  How much have the symptoms interfered with usual daily activities? Moderately  How has condition changed since care began at this facility? A little better   In general, how is the patients overall health? Fair  Onset date: Parkinsonism diagnosis in 2013, worsening weakness over the past 6 months    BACK PAIN (STarT Back Screening Tool) - (When applicable):   Has your back pain spread down your leg(s) at sometime in the last 2 weeks? []  Yes   [x]  No Have you had pain in the shoulder or neck at sometime in the past 2 weeks? [x]  Yes   []  No Have you only walked short distances because of your back pain? []  Yes   [x]  No In the past 2 weeks, have you dressed more slowly than usual because of your back pain? []  Yes   [x]  No Do you think it is not really safe for person with a condition like yours to be physically active? []  Yes   [x]  No Have worrying thoughts been going through your mind a lot of the time? [x]  Yes   []  No Do you feel that your back pain is terrible and it is never going to get any better? []  Yes   [x]  No In general, have you stopped enjoying  all the things you usually enjoy? [x]  Yes   []  No Overall, how bothersome has your back pain been in the last 2 weeks? []  Not at all   [x]  Slightly     []  Moderate   []  Very much     []  Extremely

## 2024-10-02 ENCOUNTER — Ambulatory Visit

## 2024-10-02 DIAGNOSIS — R2681 Unsteadiness on feet: Secondary | ICD-10-CM

## 2024-10-02 DIAGNOSIS — R2689 Other abnormalities of gait and mobility: Secondary | ICD-10-CM

## 2024-10-02 DIAGNOSIS — Z9181 History of falling: Secondary | ICD-10-CM

## 2024-10-02 DIAGNOSIS — M6281 Muscle weakness (generalized): Secondary | ICD-10-CM | POA: Diagnosis not present

## 2024-10-02 NOTE — Therapy (Signed)
 OUTPATIENT PHYSICAL THERAPY TREATMENT     Patient Name: Ryan Zhang MRN: [id number] DOB:1946/02/19, 78 y.o., male Today's Date: 10/02/2024   END OF SESSION:  PT End of Session - 10/02/24 1625     Visit Number 17    Date for Recertification  11/01/24    Authorization Type [employer]    Authorization Time Period 09/06/24- 10/04/24    Authorization - Visit Number 6    Authorization - Number of Visits 8    Progress Note Due on Visit 22   Recert/PN on visit #12 (09/06/24)   PT Start Time 1534    PT Stop Time 1619    PT Time Calculation (min) 45 min    Activity Tolerance Patient limited by fatigue    Behavior During Therapy WFL for tasks assessed/performed                  Past Medical History:  Diagnosis Date   Allergy    allergy shots Dr. Cloretta   Anemia    Anxiety    Asthma    moderate persistant   Bipolar affective (HCC)    Cataract    both eyes   Clotting disorder    Due to blood thinner   COPD (chronic obstructive pulmonary disease) (HCC)    Depression    Diabetes mellitus    DJD (degenerative joint disease)    Dysphagia    Gallstones    hx of, s/p cholecystectomy   GERD (gastroesophageal reflux disease)    Hepatitis A    as teenager 60's   Hollenhorst plaque    right eye   Hyperlipidemia    Hypertension    Kidney stones    hx of   Meniere's disease    Motility disorder, esophageal    Obesity    Pancreatitis    Personal history of colonic polyps 11/2004   hyperplastic.   Stroke [facility])    per MRI   Past Surgical History:  Procedure Laterality Date   CATARACT EXTRACTION Bilateral 09/2015 and 10/2015   CHOLECYSTECTOMY     COLONOSCOPY     EYE SURGERY     JOINT REPLACEMENT     TOTAL KNEE ARTHROPLASTY Bilateral    both knees   Patient Active Problem List   Diagnosis Date Noted   Hypomagnesemia 09/08/2024   Tinea cruris 07/24/2020   PCP NOTES >>>>>>>>>>>>>>>>>>>>>>>>>>>>>>> 01/12/2016   Folliculitis 05/09/2015   Hearing loss in  right ear 08/05/2014   Panic attack as reaction to stress 04/20/2012   Dysphagia, neurologic 01/07/2012   Morbid obesity (HCC) 01/07/2012   Chest pain, musculoskeletal 12/07/2011   Candidiasis of skin 08/12/2011   Annual physical exam 06/07/2011   OLECRANON BURSITIS 01/29/2011   PARTIAL ARTERIAL OCCLUSION OF RETINA 05/27/2010   Essential hypertension, benign 01/16/2010   ABNORMAL ELECTROCARDIOGRAM 01/14/2010   Asthma 10/14/2009   GERD 10/14/2009   Bipolar disorder (HCC) 02/20/2009   PHIMOSIS 02/20/2009   KNEE PAIN, CHRONIC 11/20/2008   HIP PAIN, RIGHT 08/19/2008   DM type 2 with diabetic peripheral neuropathy (HCC) 02/22/2007   Hyperlipidemia associated with type 2 diabetes mellitus (HCC) 02/22/2007   ALLERGIC RHINITIS, SEASONAL 02/22/2007   LOW BACK PAIN, CHRONIC 02/22/2007   MENIERE'S DISEASE, HX OF 02/22/2007    PCP: Amon Aloysius BRAVO, MD   REFERRING PROVIDER: Amon Aloysius BRAVO, MD  (Neurologist - Evonnie Stabs, MD)  REFERRING DIAG:  [id number] (ICD-10-CM) - Neuroleptic-induced parkinsonism (HCC)  R26.9 (ICD-10-CM) - Gait disorder  R29.6 (ICD-10-CM) - Multiple  falls   THERAPY DIAG:  Muscle weakness (generalized)  Unsteadiness on feet  Other abnormalities of gait and mobility  History of falling  RATIONALE FOR EVALUATION AND TREATMENT: Rehabilitation  ONSET DATE: ~2013 - 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 Yes: NPRS scale: 2-5 Pain location: B arms and shoulders  PERTINENT HISTORY:  Anxiety, asthma, bipolar affective disorder, COPD, depression, DM-II, DJD, chronic knee pain, B TKA,  GERD, HTN, Mnire's disease, obesity, pancreatitis, h/o CVA, neurologic dysphagia, tardive dyskinesia, HOH - L deaf & R hearing aid  PRECAUTIONS: Fall  RED FLAGS: None  WEIGHT BEARING RESTRICTIONS: No  FALLS:  Has patient fallen in last 6 months? No and before the levodopa  supplements, falls were daily (sometimes several times a day)  LIVING ENVIRONMENT: Lives with: lives with their spouse and dog Lives in: Apartment Stairs: No Has following equipment at home: Single point cane, Environmental Consultant - 2 wheeled, shower chair, and Grab bars (platform RW)  OCCUPATION: Retired  PLOF: Independent, Independent with community mobility with device, and Leisure: watching TV, listening to books, programming, walking ~15 min (around the block) - tries to do daily   PATIENT GOALS: To get back to the point where I can walk to the car unassisted (no AD) and use platform RW less in community.  Be able to assist my wife with bringing groceries in from the car.   OBJECTIVE: (objective measures completed at initial evaluation unless otherwise dated)  DIAGNOSTIC FINDINGS:  N/A - Multiple imaging studies in 2024 s/p falls but no acute fractures.  COGNITION: Overall cognitive status: Impaired and History of cognitive impairments - at baseline - mostly memory issues resulting from ECT 19 yrs ago, better over last 3 months but still notes limited STM   SENSATION: WFL  COORDINATION: B Heel/toe WFL Heel to shin unable bilaterally   POSTURE:  rounded shoulders, forward head, and flexed trunk   MUSCLE LENGTH: Hamstrings: mod/severe tight B Piriformis: mild /mod tight R>L Hip flexors: mod tight   LOWER EXTREMITY ROM:    Grossly WFL except limited hip extension and rotation bilaterally  LOWER EXTREMITY MMT:    MMT Right eval Left eval R  09/06/24 L 09/06/24  Hip flexion 3+ 3+ 3+ 3+  Hip extension 3+ 3+ 3+ 3+  Hip abduction 3- 3 3- 3-  Hip adduction 3+ 3+ 3- 3-  Hip internal rotation 4- 3+ 4- 4-   Hip external rotation 4- 3+ 4- 4-  Knee flexion 4 4 4- 4  Knee extension 4 4 4- 4  Ankle dorsiflexion 4- 4- 4- 4  Ankle plantarflexion      Ankle inversion      Ankle eversion      (Blank rows = not tested)  BED MOBILITY:  SBA  TRANSFERS: Assistive device utilized: None  Sit to stand: Modified independence and SBA Stand to sit: Modified independence and SBA Chair to chair: SBA Floor: NT  GAIT: Distance walked: clinic distances Assistive device utilized: Single point cane, platform RW/2WW, and None Level of assistance: SBA Gait pattern: step through pattern, decreased arm swing- Right, decreased arm swing- Left, decreased stride length, decreased hip/knee flexion- Right, decreased hip/knee flexion- Left, decreased ankle dorsiflexion- Right, decreased ankle dorsiflexion- Left, shuffling, festinating, trunk flexed, poor foot clearance- Right, and poor foot clearance- Left Comments: gait deviations more pronounced with decreasing AD support  FUNCTIONAL TESTS:  5 times sit to stand: unable w/o UE assist - only able to complete 4 reps in 34.03 sec; 20.18 sec with B UE assist Timed up and go (TUG): Normal - 15.84 sec w/o AD, Manual - 15.19 sec w/o AD, Cognitive - 15.66 sec w/o AD 10 meter walk test: 18.25 sec w/o AD, 16.56 sec with SPC, 14.78 sec with platform RW Gait speed: 1.80 ft/sec w/o AD, 1.98 ft/sec with SPC, 2.22 ft/sec with platform RW Berg balance scale: 41/56, 37-45 indicates significant (>80%) risk for falls (06/26/24) DGI: 12/24, <19/24 indicates high risk for falls (06/26/24)  PATIENT SURVEYS:  ABC scale: 620 / 1600 = 38.8 %, indicating a low level of physical functioning (<69% indicates risk for recurrent falls in PD) 09/06/24 - ABC scale: 09/06/24 - 160 / 1600 = 10.0 %   TODAY'S TREATMENT:  10/02/2024 THERAPEUTIC EXERCISE: To improve strength and endurance.  Demonstration, verbal and tactile cues throughout for technique.  NuStep - L5 x 6 min (LE  only)  THERAPEUTIC ACTIVITIES: To improve functional performance.  Demonstration, verbal and tactile cues throughout for technique. Standing at wall ladder for support: Alt hip extension 2 x 10 Alt hip ABD 2 x 10 Alt hip flexion march 2 x 10 Alt knee flexion/HS curl 2 x 10 Heel-toe raises 2 x 10 Mini-squat 2 x 10 Step stance lunge 4 x 20 BLE Step ups BLE 4 x 10  Step over yard stick and back forward x 10B; lateral x 10 B   09/27/2024 THERAPEUTIC EXERCISE: To improve strength and endurance.  Demonstration, verbal and tactile cues throughout for technique.  NuStep - L6 x 6 min (LE only) Seated alt LAQ 2 x 10  THERAPEUTIC ACTIVITIES: To improve functional performance.  Demonstration, verbal and tactile cues throughout for technique. Standing at wall ladder for support: Alt hip extension 2 x 10 Alt hip ABD 2 x 10 Alt hip flexion march 2 x 10 Alt knee flexion/HS curl 2 x 10 Heel-toe raises 2 x 10 Mini-squat 2 x 10 = 17.07 sec with platform 4WW Gait speed = 1.92 ft/sec with platform 4WW TUG = 26.93 sec with platform 4WW Alt fwd step-over and back using yardstick as target to increase step length and foot clearance - single UE support on counter for balance  GAIT TRAINING: To normalize gait pattern and improve safety with platform 4WW.  400' with platform [phone] - cues for increased stride length and increased hip and knee flexion to reduce shuffling with gait and reduce tripping hazard   09/25/2024  THERAPEUTIC EXERCISE: To improve strength and endurance.  Demonstration, verbal and tactile cues throughout for technique.  NuStep - L5 x 6 min (started with B UE/LE, but shifted to LE only d/t irritating UE pain) Seated GTB B hip ABD/ER clam 2 x 10 Seated GTB alt hip flexion march 2 x 10  THERAPEUTIC ACTIVITIES: To improve functional performance.  Demonstration, verbal and tactile cues throughout for technique. Orthostatic BP assessment:  Time BP HR Symptoms  Lying down 5 min  132/70 84 No dizziness  Sitting 1-2 min 112/60 83 Spinning dizziness  Standing 1-2 min 104/56 91 Spinning dizziness  Sit to stand w/o UE assist from slightly elevated treatment table focusing on increased fwd weight shift to reduce posterior LOB.  SELF CARE: Provided education to reduce fall risk.  Provided education on orthostatic/postural hypotension including symptoms, causes and strategies to minimize symptoms and reduce risk for falls: Need for adequate hydration  Slow transitions with pause to ensure no dizziness or that dizziness has resolved Gentle LE exercises before initiation of movement   09/19/24 THERAPEUTIC EXERCISE: To improve strength and endurance.  Demonstration, verbal and tactile cues throughout for technique. NuStep - L5 x 6 min (LE only) Seated LAQ BLE  10 Seated marching BLE x 10  NEUROMUSCULAR RE-EDUCATION:  Berg Balance Test  Sit to Stand Able to stand without using hands and stabilize independently   Standing Unsupported Able to stand safely 2 minutes   Sitting with Back Unsupported but Feet Supported on Floor or Stool Able to sit safely and securely 2 minutes   Stand to Sit Sits safely with minimal use of hands   Transfers Able to transfer safely, minor use of hands   Standing Unsupported with Eyes Closed Able to stand 10 seconds safely   Standing Unsupported with Feet Together Able to place feet together independently and stand 1 minute safely   From Standing, Reach Forward with Outstretched Arm Can reach confidently >25 cm (10)   From Standing Position, Pick up Object from Floor Able to pick up shoe safely and easily   From Standing Position, Turn to Look Behind Over each Shoulder Looks behind from both sides and weight shifts well   Turn 360 Degrees Able to turn 360 degrees safely but slowly   Standing Unsupported, Alternately Place Feet on Step/Stool Needs assistance to keep from falling or unable to try   Standing Unsupported, One Foot in Front  Needs help to step but can hold 15 seconds   Standing on One Leg Unable to try or needs assist to prevent fall   Total Score 43   Interpretation: 37-45 indicates significant (>80%) risk for falls (06/26/24)      09/18/24 THERAPEUTIC EXERCISE: To improve strength and endurance.  Demonstration, verbal and tactile cues throughout for technique. NuStep - L5 x 6 min (LE only) NEUROMUSCULAR RE-EDUCATION:  Seated on dynadisk hands on table for support: Sitting balance LAQ x 5 BLE- very difficult Perturbations 4 ways Marching x 5 BLE  Gait training:  270 with roller walker - some R foot drag, very short steps B  Sit to stands with chair in front very challenging 3x lots of effort standing even with support  09/06/2024 - Recertification THERAPEUTIC ACTIVITIES: To improve functional performance.  Demonstration, verbal and tactile cues throughout for technique. LE MMT 5xSTS = 32.06 sec with B UE assist TUG = 40.50 sec with platform 4WW ABC Scale: 09/06/24 - 160 / 1600 = 10.0 % = 28.63 sec with platform 4WW Gait speed = 1.15 ft/sec with platform 4WW Goal assessment   08/16/2024  THERAPEUTIC EXERCISE: To improve strength and endurance.  Demonstration, verbal and tactile cues throughout for technique. NuStep - L5 x 6 min (LE only)  THERAPEUTIC ACTIVITIES: To improve functional performance.  Demonstration, verbal and tactile cues throughout for technique. with upright/platform 4WW = 15.16 sec (normal speed), 13.84 sec (fastest comfortable speed) Gait speed with upright/platform 4WW = 2.16 ft/sec (normal speed), 2.37 ft/sec (fastest comfortable speed)  SELF CARE:  Provided education on community resources related to Parkinson's including support group and educational sessions, community-based PWR! Moves and exercise/movement groups as well as online resources related to PD.   NEUROMUSCULAR RE-EDUCATION: To improve balance, coordination, kinesthesia, posture, proprioception,  reduce fall risk, amplitude of movement, speed of movement to reduce bradykinesia, and reduce rigidity.  Standing between backs of 2 chair for UE support with SBA of PT: PWR! Step forward x 10 bil - min A x 1 for posterior LOB PWR! Step backward x 10 bil PWR! Step through forward & backward x 10 bil  GAIT TRAINING: To normalize gait pattern and prepare to wean to LRAD. 40 ft with B hiking poles and CGA of PT via gait belt - cues for reciprocal sequencing with pt having difficulty achieving normal step pattern   08/13/24 Nustep L5x63min LE only ABC scale: 810 / 1600 = 50.6 % TUG = 21.01 sec with platform 4WW Completed BERG & DGI -- see below  Berg Balance Test   Sit to Stand Able to stand  independently using hands    Standing Unsupported Able to stand safely 2 minutes    Sitting with Back Unsupported but Feet Supported on Floor or Stool Able to sit safely and securely 2 minutes    Stand to Sit Sits safely with minimal use of hands    Transfers Able to transfer safely, minor use of hands    Standing Unsupported with Eyes Closed Able to stand 10 seconds safely    Standing Unsupported with Feet Together Able to place feet together independently and stand 1 minute safely    From Standing, Reach Forward with Outstretched Arm Can reach confidently >25 cm (10)    From Standing Position, Pick up Object from Floor Able to pick up shoe safely and easily    From Standing Position, Turn to Look Behind Over each Shoulder Looks behind from both sides and weight shifts well    Turn 360 Degrees Able to turn 360 degrees safely one side only in 4 seconds or less    Standing Unsupported, Alternately Place Feet on Step/Stool Able to stand independently and complete 8 steps >20 seconds    Standing Unsupported, One Foot in Front Able to take small step independently and hold 30 seconds    Standing on One Leg Unable to try or needs assist to prevent fall    Total Score 47      Dynamic Gait Index   Level  Surface Moderate Impairment    Change in Gait Speed Mild Impairment    Gait with Horizontal Head Turns Mild Impairment    Gait with Vertical Head Turns Mild Impairment    Gait and Pivot Turn Normal    Step Over Obstacle Mild Impairment  Step Around Obstacles Normal    Steps Moderate Impairment    Total Score 16       08/09/2024 THERAPEUTIC EXERCISE: To improve strength and endurance.   NuStep - L4 x 8 min (LE only) Gait around clinic with 270'  NEUROMUSCULAR RE-EDUCATION:  With UE support: Standing heel/toe lift x 10 B Standing hip abduction x 10 B Standing marching x 10 B Standing balance x 1 min; added EC 3x10 Sidesteps 3x down and back  PWR twist x 10    08/06/2024 THERAPEUTIC EXERCISE: To improve strength and endurance.   NuStep - L4 x 8 min (LE only)  THERAPEUTIC ACTIVITIES: To improve functional performance.  Demonstration, verbal and tactile cues throughout for technique. TUG = 19.88 sec with platform 4WW (pt misjudged seat and attempted to sit on armrest of chair), 23.06 sec w/o AD 5xSTS = 20.50 sec with B UE assist, unable w/o UE assist  NEUROMUSCULAR RE-EDUCATION: To improve balance, coordination, kinesthesia, posture, proprioception, reduce fall risk, amplitude of movement, speed of movement to reduce bradykinesia, and reduce rigidity. Seated PWR! Moves: Up x 10 Rock x 10 Twist x 10 Step x 10  PWR! Sit to stand x 7 from Airex pad in chair Standing PWR! Moves (modified with chair in front for intermittent UE support as needed): Up x 10 Rock x 10 - one hand remaining on chair Twist x 10 - one hand remaining on chair Step x 8 - one hand remaining on chair   07/24/2024  THERAPEUTIC EXERCISE: To improve strength, endurance, ROM, and flexibility.  Demonstration, verbal and tactile cues throughout for technique.   - resistance reduced d/t c/o fatigue Gait 270 ft around clinic with walker   NEUROMUSCULAR RE-EDUCATION: To improve coordination, kinesthesia,  posture, proprioception Seated:  PWR! Up 2x10  PWR Rock 2x10 each direction  PWR Twist 2x10 each direction  PWR step x 5 each direction very difficult  Sit to stand x 5   07/19/2024  THERAPEUTIC EXERCISE: To improve strength, endurance, ROM, and flexibility.  Demonstration, verbal and tactile cues throughout for technique.  NuStep - L3 x 8 min (LE only) - resistance reduced d/t c/o fatigue Seated marching 2lb 2x10 BLE Seated LAQ 2lb 2x10 BLE Seated hamstring stretch x 30 B Seated piriformis stretch fig 4 x 30 B Gait 90 ft around clinic with walker   NEUROMUSCULAR RE-EDUCATION: To improve coordination, kinesthesia, posture, proprioception Seated diagonals with RTB x 10 B Seated rows GTB 2x10 Seated shoulder ext GTB 2x10 Sit to stands x 5 Standing ant/post WS in staggered stance x 10 B with walker   07/12/2024  THERAPEUTIC EXERCISE: To improve strength, endurance, ROM, and flexibility.  Demonstration, verbal and tactile cues throughout for technique.  NuStep - L3 x 6 min (LE only) - resistance reduced d/t c/o fatigue Seated hip hinge HS stretch 3 x 30 bil Seated hip hinge figure-4 piriformis stretch 3 x 30 bil Seated hip flexor stretch in lunge position over edge of seat 3 x 30 bil  NEUROMUSCULAR RE-EDUCATION: To improve coordination, kinesthesia, posture, proprioception, reduce fall risk, amplitude of movement, speed of movement to reduce bradykinesia, and reduce rigidity. Seated PWR! Moves: Up x 10 Rock x 5 bil Twist - deferred d/t increased shoulder pain Step x 5 bil Seated scapular retraction into pool noodle along spine and back of chair 10 x 3 Seated GTB scapular retraction + B shoulder row 10 x 3 Seated GTB scapular retraction + B shoulder extension 10 x 3  PATIENT EDUCATION:  Education details: HEP review, HEP progression - advanced seated march to GTB, and symptoms and management of orthostatic/postural hypotension  Person educated: Patient Education  method: Explanation, Demonstration, Verbal cues, Handouts, and MedBridge[employer] app updated Education comprehension: verbalized understanding, returned demonstration, verbal cues required, and needs further education  HOME EXERCISE PROGRAM: *Pt has MedBridge[employer] app access.   Access Code: [email or web address] URL: [email or web address] Date: 10/02/2024 Prepared by: Sol Gaskins  Exercises - Seated Hip Abduction with Resistance  - 1 x daily - 3 x weekly - 3 sets - 10 reps - Seated Hip Adduction Isometrics with Ball  - 1 x daily - 3 x weekly - 3 sets - 10 reps - Seated March with Resistance  - 1 x daily - 3 x weekly - 3 sets - 10 reps - Sit to Stand  - 1 x daily - 3 x weekly - 1 sets - 5 reps - Seated Hamstring Stretch  - 1-2 x daily - 7 x weekly - 3 reps - 30 sec hold - Seated Figure 4 Piriformis Stretch  - 1-2 x daily - 7 x weekly - 3 reps - 30 sec hold - Seated Hip Flexor Stretch  - 1-2 x daily - 7 x weekly - 3 reps - 30 sec hold - Standing Hip Abduction with Counter Support  - 1 x daily - 7 x weekly - 2 sets - 10 reps - Standing Hip Extension with Counter Support  - 1 x daily - 7 x weekly - 2 sets - 10 reps - Standing March with Counter Support  - 1 x daily - 7 x weekly - 2 sets - 10 reps - Heel Raises with Counter Support  - 1 x daily - 7 x weekly - 2 sets - 10 reps  Patient Education - Check for Safety - Orthostatic Hypotension - Postural Hypotension  Patient Education - Check for Safety - Orthostatic Hypotension - Postural Hypotension  PWR Moves: - Sitting - Standing (modified)   ASSESSMENT:  CLINICAL IMPRESSION: Therapeutic Activities focused on improving standing tolerance required for daily functional activities. Cues required throughout for form and to correct movement. He was able to complete all exercises in standing position today.  Kacyn will benefit from continued skilled PT to address ongoing flexibility, strength, and balance deficits to improve mobility and  activity tolerance with decreased pain interference and decreased risk for falls.    OBJECTIVE IMPAIRMENTS: Abnormal gait, decreased activity tolerance, decreased balance, decreased coordination, decreased endurance, decreased knowledge of condition, decreased knowledge of use of DME, decreased mobility, difficulty walking, decreased strength, decreased safety awareness, dizziness, impaired perceived functional ability, impaired flexibility, improper body mechanics, postural dysfunction, and pain.   ACTIVITY LIMITATIONS: carrying, lifting, bending, standing, squatting, stairs, transfers, bed mobility, reach over head, locomotion level, and caring for others  PARTICIPATION LIMITATIONS: meal prep, cleaning, laundry, shopping, and community activity  PERSONAL FACTORS: Age, Fitness, Past/current experiences, Time since onset of injury/illness/exacerbation, and 3+ comorbidities: Anxiety, asthma, bipolar affective disorder, COPD, depression, DM-II, DJD, chronic knee pain, B TKA, GERD, HTN, Mnire's disease, obesity, pancreatitis, h/o CVA, neurologic dysphagia, tardive dyskinesia, HOH - L deaf & R hearing aid are also affecting patient's functional outcome.   REHAB POTENTIAL: Good  CLINICAL DECISION MAKING: Unstable/unpredictable  EVALUATION COMPLEXITY: High   GOALS: Goals reviewed with patient? Yes  SHORT TERM GOALS: Target date: 07/30/2024, extended to 10/04/2024  Patient will be independent with initial HEP. Baseline: To be established 07/05/24 - consistent with current HEP  07/12/24 -  clarified HEP frequency Goal status: MET - 08/06/24   2.  Patient will demonstrate decreased fall risk by scoring < 13.5 sec on normal TUG. Baseline: 15.84 sec w/o AD 08/06/24 - 19.88 sec with platform 4WW (pt misjudged seat and attempted to sit on armrest of chair), 23.06 sec w/o AD 08/13/24 - 21.01 sec with platform 4WW 09/05/24 - 40.50 sec with platform 4WW Goal status: IN PROGRESS - 09/27/24 - 26.93 sec  with platform 4WW  3.  Patient will be educated on strategies to decrease risk of falls.  Baseline:  Goal status: MET - 07/09/24   LONG TERM GOALS: Target date: 09/10/2024, extended to 11/01/2024  Patient will be independent with ongoing/advanced HEP for self-management at home incorporating PWR! Moves as indicated .  Baseline:  08/13/24 - met for current HEP  Goal status: IN PROGRESS - 09/25/24 - HEP reviewed and updated  2.  Patient will be able to ambulate 600' with LRAD with good safety to access community.  Baseline:  Goal status: IN PROGRESS - 08/09/24 - 270' with standing 4WW  3.  Patient will be able to step up/down curb safely with LRAD for safety with community ambulation.  Baseline:  Goal status: IN PROGRESS - 08/16/24 - pt reports he does not attempt curbs with platform 4WW as walker too heavy to navigate up curb   4.  Patient will demonstrate gait speed of >/= 2.62 ft/sec (0.8 m/s) to be a safe limited community ambulator with decreased risk for recurrent falls.  Baseline: 1.80 ft/sec w/o AD, 1.98 ft/sec with SPC and 2.22 ft/sec with platform RW 07/19/24 - 2.26 ft/sec 08/16/24 - 2.16 ft/sec (normal speed), 2.37 ft/sec (fastest comfortable speed) with upright/platform 4WW 09/06/24 - 1.15 ft/sec with platform 4WW Goal status: IN PROGRESS - 09/27/24 - 1.92 ft/sec with platform 4WW  5.  Patient will improve 5x STS time to </= 15 seconds w/o need for UE assist to demonstrate improved functional strength and transfer efficiency. Baseline: unable w/o UE assist - only able to complete 4 reps in 34.03 sec; 20.18 sec with B UE assist 08/06/24 - 20.50 sec with B UE assist Goal status: IN PROGRESS - 09/06/24 - 32.06 sec with B UE assist  6.  Patient will demonstrate at least 19/24 on DGI to improve gait stability and reduce risk for falls. Baseline: 12/24 (06/26/24) Goal status: IN PROGRESS - 08/13/24 - 16/24  7.  Patient will improve Berg score by at least 8 points to improve safety and  stability with ADLs in standing and reduce risk for falls. (MCID = 8 points)   Baseline: 41/56 (06/26/24) Goal status: IN PROGRESS - 08/13/24 - 47/56  8.  Patient will report >/= 52% on ABC scale to demonstrate improved balance confidence and decreased risk for falls. Baseline: 620 / 1600 = 38.8 % 08/13/24 - 810 / 1600 = 50.6 % Goal status: IN PROGRESS - 09/06/24 - 160 / 1600 = 10.0 %  9. Patient will verbalize understanding of local Parkinson's disease community resources, including community fitness post d/c. Baseline:  Goal status: IN PROGRESS - 08/16/24 - handouts provided   PLAN:  PT FREQUENCY: 2x/week  PT DURATION: 12 weeks  PLANNED INTERVENTIONS: 97164- PT Re-evaluation, 97750- Physical Performance Testing, 97110-Therapeutic exercises, 97530- Therapeutic activity, [id number]- Neuromuscular re-education, 97535- Self Care, [id number]- Manual therapy, [id number]- Gait training, [id number]- Canalith repositioning, [id number]- Electrical stimulation (unattended), 97035- Ultrasound, [id number]- Ionotophoresis 4mg /ml Dexamethasone , 79439 (1-2 muscles), 20561 (3+ muscles)- Dry Needling, Patient/Family education, Balance training, Stair training, Taping,  Joint mobilization, Vestibular training, DME instructions, Cryotherapy, and Moist heat  PLAN FOR NEXT SESSION: continue LE strengthening; balance activities (both static and dynamic); PWR! Moves    Doloras Tellado L Jolina Symonds, PTA  10/02/2024, 4:32 PM       Date of referral: 05/31/24 Referring provider: Amon Aloysius BRAVO, MD Referring diagnosis?  G21.11,T43.505A (ICD-10-CM) - Neuroleptic-induced parkinsonism (HCC)  R26.9 (ICD-10-CM) - Gait disorder  R29.6 (ICD-10-CM) - Multiple falls   Treatment diagnosis? (if different than referring diagnosis)  Muscle weakness (generalized)  Unsteadiness on feet  Other abnormalities of gait and mobility  History of falling  What was this (referring dx) caused by? Ongoing Issue and Other: neurodegenerative disease  Nature of Condition:  Chronic (continuous duration > 3 months)   Laterality: Both  Current Functional Measure Score: Other ABC Scale: 810 / 1600 = 50.6 %, indicating a moderate level of physical functioning (<69% indicates risk for recurrent falls in PD)    Objective measurements identify impairments when they are compared to normal values, the uninvolved extremity, and prior level of function.  [x]  Yes  []  No  Objective assessment of functional ability: Moderate functional limitations   Briefly describe symptoms:  Hoy is progressing with PT demonstrating improving tolerance for standing activities, improving gait safety and speed with his platform 4WW, and decreasing fall risk per improvements in Berg from 41/56 to 47/56 (indicating a reduction in fall risk from significant (greater than 80%) to moderate moderate (>50%) and DGI from 12/24 to 16/24 (scores of <19/24 still indicating high risk for falls) today.  He has not had any falls in the past month.  Braylen will benefit from continued skilled PT to address ongoing motor control, strength and balance deficits to improve mobility and activity tolerance with decreased pain interference and decreased risk for falls.   How did symptoms start: gradual onset  Average pain intensity:  Last 24 hours: 2/10  Past week: 2/10  How often does the pt experience symptoms? Frequently  How much have the symptoms interfered with usual daily activities? Moderately  How has condition changed since care began at this facility? A little better   In general, how is the patients overall health? Fair  Onset date: Parkinsonism diagnosis in 2013, worsening weakness over the past 6 months    BACK PAIN (STarT Back Screening Tool) - (When applicable):   Has your back pain spread down your leg(s) at sometime in the last 2 weeks? []  Yes   [x]  No Have you had pain in the shoulder or neck at sometime in the past 2 weeks? [x]  Yes   []  No Have you only walked short distances because  of your back pain? []  Yes   [x]  No In the past 2 weeks, have you dressed more slowly than usual because of your back pain? []  Yes   [x]  No Do you think it is not really safe for person with a condition like yours to be physically active? []  Yes   [x]  No Have worrying thoughts been going through your mind a lot of the time? [x]  Yes   []  No Do you feel that your back pain is terrible and it is never going to get any better? []  Yes   [x]  No In general, have you stopped enjoying all the things you usually enjoy? [x]  Yes   []  No Overall, how bothersome has your back pain been in the last 2 weeks? []  Not at all   [x]  Slightly     []   Moderate   []  Very much     []  Extremely

## 2024-10-04 ENCOUNTER — Ambulatory Visit

## 2024-10-04 DIAGNOSIS — Z9181 History of falling: Secondary | ICD-10-CM

## 2024-10-04 DIAGNOSIS — R2681 Unsteadiness on feet: Secondary | ICD-10-CM

## 2024-10-04 DIAGNOSIS — M6281 Muscle weakness (generalized): Secondary | ICD-10-CM | POA: Diagnosis not present

## 2024-10-04 DIAGNOSIS — R2689 Other abnormalities of gait and mobility: Secondary | ICD-10-CM

## 2024-10-04 NOTE — Therapy (Signed)
 OUTPATIENT PHYSICAL THERAPY TREATMENT     Patient Name: Ryan Zhang MRN: [id number] DOB:Jun 26, 1946, 78 y.o., male Today's Date: 10/04/2024   END OF SESSION:  PT End of Session - 10/04/24 1355     Visit Number 18    Date for Recertification  11/01/24    Authorization Type [employer]    Authorization Time Period 09/06/24- 10/04/24    Authorization - Visit Number 7    Authorization - Number of Visits 8    Progress Note Due on Visit 22   Recert/PN on visit #12 (09/06/24)   PT Start Time 1316    PT Stop Time 1354    PT Time Calculation (min) 38 min    Activity Tolerance Patient limited by fatigue    Behavior During Therapy WFL for tasks assessed/performed                   Past Medical History:  Diagnosis Date   Allergy    allergy shots Dr. Cloretta   Anemia    Anxiety    Asthma    moderate persistant   Bipolar affective (HCC)    Cataract    both eyes   Clotting disorder    Due to blood thinner   COPD (chronic obstructive pulmonary disease) (HCC)    Depression    Diabetes mellitus    DJD (degenerative joint disease)    Dysphagia    Gallstones    hx of, s/p cholecystectomy   GERD (gastroesophageal reflux disease)    Hepatitis A    as teenager 60's   Hollenhorst plaque    right eye   Hyperlipidemia    Hypertension    Kidney stones    hx of   Meniere's disease    Motility disorder, esophageal    Obesity    Pancreatitis    Personal history of colonic polyps 11/2004   hyperplastic.   Stroke [facility])    per MRI   Past Surgical History:  Procedure Laterality Date   CATARACT EXTRACTION Bilateral 09/2015 and 10/2015   CHOLECYSTECTOMY     COLONOSCOPY     EYE SURGERY     JOINT REPLACEMENT     TOTAL KNEE ARTHROPLASTY Bilateral    both knees   Patient Active Problem List   Diagnosis Date Noted   Hypomagnesemia 09/08/2024   Tinea cruris 07/24/2020   PCP NOTES >>>>>>>>>>>>>>>>>>>>>>>>>>>>>>> 01/12/2016   Folliculitis 05/09/2015   Hearing loss in  right ear 08/05/2014   Panic attack as reaction to stress 04/20/2012   Dysphagia, neurologic 01/07/2012   Morbid obesity (HCC) 01/07/2012   Chest pain, musculoskeletal 12/07/2011   Candidiasis of skin 08/12/2011   Annual physical exam 06/07/2011   OLECRANON BURSITIS 01/29/2011   PARTIAL ARTERIAL OCCLUSION OF RETINA 05/27/2010   Essential hypertension, benign 01/16/2010   ABNORMAL ELECTROCARDIOGRAM 01/14/2010   Asthma 10/14/2009   GERD 10/14/2009   Bipolar disorder (HCC) 02/20/2009   PHIMOSIS 02/20/2009   KNEE PAIN, CHRONIC 11/20/2008   HIP PAIN, RIGHT 08/19/2008   DM type 2 with diabetic peripheral neuropathy (HCC) 02/22/2007   Hyperlipidemia associated with type 2 diabetes mellitus (HCC) 02/22/2007   ALLERGIC RHINITIS, SEASONAL 02/22/2007   LOW BACK PAIN, CHRONIC 02/22/2007   MENIERE'S DISEASE, HX OF 02/22/2007    PCP: Amon Aloysius BRAVO, MD   REFERRING PROVIDER: Amon Aloysius BRAVO, MD  (Neurologist - Evonnie Stabs, MD)  REFERRING DIAG:  [phone] (ICD-10-CM) - Neuroleptic-induced parkinsonism (HCC)  R26.9 (ICD-10-CM) - Gait disorder  R29.6 (ICD-10-CM) -  Multiple falls   THERAPY DIAG:  Muscle weakness (generalized)  Unsteadiness on feet  Other abnormalities of gait and mobility  History of falling  RATIONALE FOR EVALUATION AND TREATMENT: Rehabilitation  ONSET DATE: ~2013 - 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 Yes: NPRS scale: 1/10 Pain location: L shoulder  PERTINENT HISTORY:  Anxiety, asthma, bipolar affective disorder, COPD, depression, DM-II, DJD, chronic knee pain, B TKA,  GERD, HTN, Mnire's disease, obesity, pancreatitis, h/o CVA, neurologic dysphagia, tardive dyskinesia, HOH - L deaf & R hearing aid  PRECAUTIONS: Fall  RED FLAGS: None  WEIGHT BEARING RESTRICTIONS: No  FALLS:  Has patient fallen in last 6 months? No and before the levodopa  supplements, falls were daily (sometimes several times a day)  LIVING ENVIRONMENT: Lives with: lives with their spouse and dog Lives in: Apartment Stairs: No Has following equipment at home: Single point cane, Environmental Consultant - 2 wheeled, shower chair, and Grab bars (platform RW)  OCCUPATION: Retired  PLOF: Independent, Independent with community mobility with device, and Leisure: watching TV, listening to books, programming, walking ~15 min (around the block) - tries to do daily   PATIENT GOALS: To get back to the point where I can walk to the car unassisted (no AD) and use platform RW less in community.  Be able to assist my wife with bringing groceries in from the car.   OBJECTIVE: (objective measures completed at initial evaluation unless otherwise dated)  DIAGNOSTIC FINDINGS:  N/A - Multiple imaging studies in 2024 s/p falls but no acute fractures.  COGNITION: Overall cognitive status: Impaired and History of cognitive impairments - at baseline - mostly memory issues resulting from ECT 19 yrs ago, better over last 3 months but still notes limited STM   SENSATION: WFL  COORDINATION: B Heel/toe WFL Heel to shin unable bilaterally   POSTURE:  rounded shoulders, forward head, and flexed trunk   MUSCLE LENGTH: Hamstrings: mod/severe tight B Piriformis: mild /mod tight R>L Hip flexors: mod tight   LOWER EXTREMITY ROM:    Grossly WFL except limited hip extension and rotation bilaterally  LOWER EXTREMITY MMT:    MMT Right eval Left eval R 09/06/24  L 09/06/24  Hip flexion 3+ 3+ 3+ 3+  Hip extension 3+ 3+ 3+ 3+  Hip abduction 3- 3 3- 3-  Hip adduction 3+ 3+ 3- 3-  Hip internal rotation 4- 3+ 4- 4-   Hip external rotation 4- 3+ 4- 4-  Knee flexion 4 4 4- 4  Knee extension 4 4 4- 4  Ankle dorsiflexion 4- 4- 4- 4  Ankle plantarflexion      Ankle inversion      Ankle eversion      (Blank rows = not tested)  BED MOBILITY:  SBA  TRANSFERS: Assistive device utilized: None  Sit to stand: Modified independence and SBA Stand to sit: Modified independence and SBA Chair to chair: SBA Floor: NT  GAIT: Distance walked: clinic distances Assistive device utilized: Single point cane, platform RW/2WW, and None Level of assistance: SBA Gait pattern: step through pattern, decreased arm swing- Right, decreased arm swing- Left, decreased stride length, decreased hip/knee flexion- Right, decreased hip/knee flexion- Left, decreased ankle dorsiflexion- Right, decreased ankle dorsiflexion- Left, shuffling, festinating, trunk flexed, poor foot clearance- Right, and poor foot clearance- Left Comments: gait deviations more pronounced with decreasing AD support  FUNCTIONAL TESTS:  5 times sit to stand: unable w/o UE assist - only able to complete 4 reps in 34.03 sec; 20.18 sec with B UE assist Timed up and go (TUG): Normal - 15.84 sec w/o AD, Manual - 15.19 sec w/o AD, Cognitive - 15.66 sec w/o AD 10 meter walk test: 18.25 sec w/o AD, 16.56 sec with SPC, 14.78 sec with platform RW Gait speed: 1.80 ft/sec w/o AD, 1.98 ft/sec with SPC, 2.22 ft/sec with platform RW Berg balance scale: 41/56, 37-45 indicates significant (>80%) risk for falls (06/26/24) DGI: 12/24, <19/24 indicates high risk for falls (06/26/24)  PATIENT SURVEYS:  ABC scale: 620 / 1600 = 38.8 %, indicating a low level of physical functioning (<69% indicates risk for recurrent falls in PD) 09/06/24 - ABC scale: 09/06/24 - 160 / 1600 = 10.0 %   TODAY'S TREATMENT:  11/120/2025 THERAPEUTIC EXERCISE: To improve strength and endurance.  Demonstration, verbal and tactile cues throughout for technique.  NuStep - L6 x 6 min (LE only) 5xSTS-  first trial no hands 42 seconds; 2nd trial with hands- 25.28 seconds ABC scale DGI  [facility] PT Assessment - 10/04/24 0001       Dynamic Gait Index   Level Surface Moderate Impairment    Change in Gait Speed Mild Impairment    Gait with Horizontal Head Turns Mild Impairment    Gait with Vertical Head Turns Mild Impairment    Gait and Pivot Turn Normal    Step Over Obstacle Mild Impairment    Step Around Obstacles Normal    Steps Moderate Impairment    Total Score 16          10/02/2024 THERAPEUTIC EXERCISE: To improve strength and endurance.  Demonstration, verbal and tactile cues throughout for technique.  NuStep - L5 x 6 min (LE only)  THERAPEUTIC ACTIVITIES: To improve functional performance.  Demonstration, verbal and tactile cues throughout for technique. Standing at wall ladder for support: Alt hip extension 2 x 10 Alt hip ABD 2 x 10 Alt hip flexion march 2 x 10 Alt knee flexion/HS curl 2 x 10 Heel-toe raises 2 x 10 Mini-squat 2 x 10 Step stance lunge 4 x 20 BLE Step ups BLE 4 x 10  Step over yard stick and back forward x 10B; lateral x 10 B  09/27/2024 THERAPEUTIC EXERCISE: To improve strength and endurance.  Demonstration, verbal and tactile cues throughout for technique.  NuStep - L6 x 6 min (LE only) Seated alt LAQ 2 x 10  THERAPEUTIC ACTIVITIES: To improve functional performance.  Demonstration, verbal and tactile cues throughout for technique. Standing at wall ladder for support: Alt hip extension 2 x 10 Alt hip ABD 2 x 10 Alt hip flexion march 2 x 10 Alt knee flexion/HS curl 2 x 10 Heel-toe raises 2 x 10 Mini-squat 2 x 10 = 17.07 sec with platform 4WW Gait speed = 1.92 ft/sec with platform 4WW TUG = 26.93 sec with platform 4WW Alt fwd step-over and back using yardstick as target to increase step length and foot clearance - single UE support on counter for balance  GAIT TRAINING: To normalize gait pattern and improve safety with platform 4WW.   400' with platform [phone] - cues for increased stride length and increased hip and knee flexion to reduce shuffling with gait and reduce tripping hazard   09/25/2024  THERAPEUTIC EXERCISE: To improve strength and endurance.  Demonstration, verbal and tactile cues throughout for technique.  NuStep - L5 x 6 min (started with B UE/LE, but shifted to LE only d/t irritating UE pain) Seated GTB B hip ABD/ER clam 2 x 10 Seated GTB alt hip flexion march 2 x 10  THERAPEUTIC ACTIVITIES: To improve functional performance.  Demonstration, verbal and tactile cues throughout for technique. Orthostatic BP assessment:  Time BP HR Symptoms  Lying down 5 min 132/70 84 No dizziness  Sitting 1-2 min 112/60 83 Spinning dizziness  Standing 1-2 min 104/56 91 Spinning dizziness  Sit to stand w/o UE assist from slightly elevated treatment table focusing on increased fwd weight shift to reduce posterior LOB.  SELF CARE: Provided education to reduce fall risk.  Provided education on orthostatic/postural hypotension including symptoms, causes and strategies to minimize symptoms and reduce risk for falls: Need for adequate hydration  Slow transitions with pause to ensure no dizziness or that dizziness has resolved Gentle LE exercises before initiation of movement   09/19/24 THERAPEUTIC EXERCISE: To improve strength and endurance.  Demonstration, verbal and tactile cues throughout for technique. NuStep - L5 x 6 min (LE only) Seated LAQ BLE  10 Seated marching BLE x 10  NEUROMUSCULAR RE-EDUCATION:  Berg Balance Test  Sit to Stand Able to stand without using hands and stabilize independently   Standing Unsupported Able to stand safely 2 minutes   Sitting with Back Unsupported but Feet Supported on Floor or Stool Able to sit safely and securely 2 minutes   Stand to Sit Sits safely with minimal use of hands   Transfers Able to transfer safely, minor use of hands   Standing Unsupported with Eyes Closed Able to  stand 10 seconds safely   Standing Unsupported with Feet Together Able to place feet together independently and stand 1 minute safely   From Standing, Reach Forward with Outstretched Arm Can reach confidently >25 cm (10)   From Standing Position, Pick up Object from Floor Able to pick up shoe safely and easily   From Standing Position, Turn to Look Behind Over each Shoulder Looks behind from both sides and weight shifts well   Turn 360 Degrees Able to turn 360 degrees safely but slowly   Standing Unsupported, Alternately Place Feet on Step/Stool Needs assistance to keep from falling or unable to try   Standing Unsupported, One Foot in Front Needs help to step but can  hold 15 seconds   Standing on One Leg Unable to try or needs assist to prevent fall   Total Score 43   Interpretation: 37-45 indicates significant (>80%) risk for falls (06/26/24)      09/18/24 THERAPEUTIC EXERCISE: To improve strength and endurance.  Demonstration, verbal and tactile cues throughout for technique. NuStep - L5 x 6 min (LE only) NEUROMUSCULAR RE-EDUCATION:  Seated on dynadisk hands on table for support: Sitting balance LAQ x 5 BLE- very difficult Perturbations 4 ways Marching x 5 BLE  Gait training:  270 with roller walker - some R foot drag, very short steps B  Sit to stands with chair in front very challenging 3x lots of effort standing even with support  09/06/2024 - Recertification THERAPEUTIC ACTIVITIES: To improve functional performance.  Demonstration, verbal and tactile cues throughout for technique. LE MMT 5xSTS = 32.06 sec with B UE assist TUG = 40.50 sec with platform 4WW ABC Scale: 09/06/24 - 160 / 1600 = 10.0 % = 28.63 sec with platform 4WW Gait speed = 1.15 ft/sec with platform 4WW Goal assessment   08/16/2024  THERAPEUTIC EXERCISE: To improve strength and endurance.  Demonstration, verbal and tactile cues throughout for technique. NuStep - L5 x 6 min (LE only)  THERAPEUTIC  ACTIVITIES: To improve functional performance.  Demonstration, verbal and tactile cues throughout for technique. with upright/platform 4WW = 15.16 sec (normal speed), 13.84 sec (fastest comfortable speed) Gait speed with upright/platform 4WW = 2.16 ft/sec (normal speed), 2.37 ft/sec (fastest comfortable speed)  SELF CARE:  Provided education on community resources related to Parkinson's including support group and educational sessions, community-based PWR! Moves and exercise/movement groups as well as online resources related to PD.   NEUROMUSCULAR RE-EDUCATION: To improve balance, coordination, kinesthesia, posture, proprioception, reduce fall risk, amplitude of movement, speed of movement to reduce bradykinesia, and reduce rigidity.  Standing between backs of 2 chair for UE support with SBA of PT: PWR! Step forward x 10 bil - min A x 1 for posterior LOB PWR! Step backward x 10 bil PWR! Step through forward & backward x 10 bil  GAIT TRAINING: To normalize gait pattern and prepare to wean to LRAD. 40 ft with B hiking poles and CGA of PT via gait belt - cues for reciprocal sequencing with pt having difficulty achieving normal step pattern   08/13/24 Nustep L5x25min LE only ABC scale: 810 / 1600 = 50.6 % TUG = 21.01 sec with platform 4WW Completed BERG & DGI -- see below  Berg Balance Test   Sit to Stand Able to stand  independently using hands    Standing Unsupported Able to stand safely 2 minutes    Sitting with Back Unsupported but Feet Supported on Floor or Stool Able to sit safely and securely 2 minutes    Stand to Sit Sits safely with minimal use of hands    Transfers Able to transfer safely, minor use of hands    Standing Unsupported with Eyes Closed Able to stand 10 seconds safely    Standing Unsupported with Feet Together Able to place feet together independently and stand 1 minute safely    From Standing, Reach Forward with Outstretched Arm Can reach confidently >25 cm (10)     From Standing Position, Pick up Object from Floor Able to pick up shoe safely and easily    From Standing Position, Turn to Look Behind Over each Shoulder Looks behind from both sides and weight shifts well    Turn  360 Degrees Able to turn 360 degrees safely one side only in 4 seconds or less    Standing Unsupported, Alternately Place Feet on Step/Stool Able to stand independently and complete 8 steps >20 seconds    Standing Unsupported, One Foot in Front Able to take small step independently and hold 30 seconds    Standing on One Leg Unable to try or needs assist to prevent fall    Total Score 47      Dynamic Gait Index   Level Surface Moderate Impairment    Change in Gait Speed Mild Impairment    Gait with Horizontal Head Turns Mild Impairment    Gait with Vertical Head Turns Mild Impairment    Gait and Pivot Turn Normal    Step Over Obstacle Mild Impairment    Step Around Obstacles Normal    Steps Moderate Impairment    Total Score 16       08/09/2024 THERAPEUTIC EXERCISE: To improve strength and endurance.   NuStep - L4 x 8 min (LE only) Gait around clinic with 270'  NEUROMUSCULAR RE-EDUCATION:  With UE support: Standing heel/toe lift x 10 B Standing hip abduction x 10 B Standing marching x 10 B Standing balance x 1 min; added EC 3x10 Sidesteps 3x down and back  PWR twist x 10    08/06/2024 THERAPEUTIC EXERCISE: To improve strength and endurance.   NuStep - L4 x 8 min (LE only)  THERAPEUTIC ACTIVITIES: To improve functional performance.  Demonstration, verbal and tactile cues throughout for technique. TUG = 19.88 sec with platform 4WW (pt misjudged seat and attempted to sit on armrest of chair), 23.06 sec w/o AD 5xSTS = 20.50 sec with B UE assist, unable w/o UE assist  NEUROMUSCULAR RE-EDUCATION: To improve balance, coordination, kinesthesia, posture, proprioception, reduce fall risk, amplitude of movement, speed of movement to reduce bradykinesia, and reduce  rigidity. Seated PWR! Moves: Up x 10 Rock x 10 Twist x 10 Step x 10  PWR! Sit to stand x 7 from Airex pad in chair Standing PWR! Moves (modified with chair in front for intermittent UE support as needed): Up x 10 Rock x 10 - one hand remaining on chair Twist x 10 - one hand remaining on chair Step x 8 - one hand remaining on chair   07/24/2024  THERAPEUTIC EXERCISE: To improve strength, endurance, ROM, and flexibility.  Demonstration, verbal and tactile cues throughout for technique.   - resistance reduced d/t c/o fatigue Gait 270 ft around clinic with walker   NEUROMUSCULAR RE-EDUCATION: To improve coordination, kinesthesia, posture, proprioception Seated:  PWR! Up 2x10  PWR Rock 2x10 each direction  PWR Twist 2x10 each direction  PWR step x 5 each direction very difficult  Sit to stand x 5   07/19/2024  THERAPEUTIC EXERCISE: To improve strength, endurance, ROM, and flexibility.  Demonstration, verbal and tactile cues throughout for technique.  NuStep - L3 x 8 min (LE only) - resistance reduced d/t c/o fatigue Seated marching 2lb 2x10 BLE Seated LAQ 2lb 2x10 BLE Seated hamstring stretch x 30 B Seated piriformis stretch fig 4 x 30 B Gait 90 ft around clinic with walker   NEUROMUSCULAR RE-EDUCATION: To improve coordination, kinesthesia, posture, proprioception Seated diagonals with RTB x 10 B Seated rows GTB 2x10 Seated shoulder ext GTB 2x10 Sit to stands x 5 Standing ant/post WS in staggered stance x 10 B with walker   07/12/2024  THERAPEUTIC EXERCISE: To improve strength, endurance, ROM, and flexibility.  Demonstration, verbal and  tactile cues throughout for technique.  NuStep - L3 x 6 min (LE only) - resistance reduced d/t c/o fatigue Seated hip hinge HS stretch 3 x 30 bil Seated hip hinge figure-4 piriformis stretch 3 x 30 bil Seated hip flexor stretch in lunge position over edge of seat 3 x 30 bil  NEUROMUSCULAR RE-EDUCATION: To improve coordination,  kinesthesia, posture, proprioception, reduce fall risk, amplitude of movement, speed of movement to reduce bradykinesia, and reduce rigidity. Seated PWR! Moves: Up x 10 Rock x 5 bil Twist - deferred d/t increased shoulder pain Step x 5 bil Seated scapular retraction into pool noodle along spine and back of chair 10 x 3 Seated GTB scapular retraction + B shoulder row 10 x 3 Seated GTB scapular retraction + B shoulder extension 10 x 3    PATIENT EDUCATION:  Education details: HEP review, HEP progression - advanced seated march to GTB, and symptoms and management of orthostatic/postural hypotension  Person educated: Patient Education method: Explanation, Demonstration, Verbal cues, Handouts, and MedBridge[employer] app updated Education comprehension: verbalized understanding, returned demonstration, verbal cues required, and needs further education  HOME EXERCISE PROGRAM: *Pt has MedBridge[employer] app access.   Access Code: [email or web address] URL: [email or web address] Date: 10/02/2024 Prepared by: Sol Gaskins  Exercises - Seated Hip Abduction with Resistance  - 1 x daily - 3 x weekly - 3 sets - 10 reps - Seated Hip Adduction Isometrics with Ball  - 1 x daily - 3 x weekly - 3 sets - 10 reps - Seated March with Resistance  - 1 x daily - 3 x weekly - 3 sets - 10 reps - Sit to Stand  - 1 x daily - 3 x weekly - 1 sets - 5 reps - Seated Hamstring Stretch  - 1-2 x daily - 7 x weekly - 3 reps - 30 sec hold - Seated Figure 4 Piriformis Stretch  - 1-2 x daily - 7 x weekly - 3 reps - 30 sec hold - Seated Hip Flexor Stretch  - 1-2 x daily - 7 x weekly - 3 reps - 30 sec hold - Standing Hip Abduction with Counter Support  - 1 x daily - 7 x weekly - 2 sets - 10 reps - Standing Hip Extension with Counter Support  - 1 x daily - 7 x weekly - 2 sets - 10 reps - Standing March with Counter Support  - 1 x daily - 7 x weekly - 2 sets - 10 reps - Heel Raises with Counter Support  - 1 x daily - 7 x weekly - 2  sets - 10 reps  Patient Education - Check for Safety - Orthostatic Hypotension - Postural Hypotension  Patient Education - Check for Safety - Orthostatic Hypotension - Postural Hypotension  PWR Moves: - Sitting - Standing (modified)   ASSESSMENT:  CLINICAL IMPRESSION: Today we reassessed DGI, 5xSTS, and ABC scale. Pt scored 16/24 on the DGI which still indicates a risk for falls, along with seeing score of 43/56 on BERG on 09/19/24, which shows moderate risk for falls. His score on 5xSTS has improved but with using UE support and he also required some assistance with standing at times. ABC scale score has went down 10 points since the last time assessed. We have seen halt in progress due to recent medical issues, showing decline in balance test scores and functional capacity.  Ryan Zhang will benefit from continued skilled PT to address ongoing functional strength, and balance deficits to improve mobility and activity tolerance with  decreased pain interference and decreased risk for falls.    OBJECTIVE IMPAIRMENTS: Abnormal gait, decreased activity tolerance, decreased balance, decreased coordination, decreased endurance, decreased knowledge of condition, decreased knowledge of use of DME, decreased mobility, difficulty walking, decreased strength, decreased safety awareness, dizziness, impaired perceived functional ability, impaired flexibility, improper body mechanics, postural dysfunction, and pain.   ACTIVITY LIMITATIONS: carrying, lifting, bending, standing, squatting, stairs, transfers, bed mobility, reach over head, locomotion level, and caring for others  PARTICIPATION LIMITATIONS: meal prep, cleaning, laundry, shopping, and community activity  PERSONAL FACTORS: Age, Fitness, Past/current experiences, Time since onset of injury/illness/exacerbation, and 3+ comorbidities: Anxiety, asthma, bipolar affective disorder, COPD, depression, DM-II, DJD, chronic knee pain, B TKA, GERD, HTN,  Mnire's disease, obesity, pancreatitis, h/o CVA, neurologic dysphagia, tardive dyskinesia, HOH - L deaf & R hearing aid are also affecting patient's functional outcome.   REHAB POTENTIAL: Good  CLINICAL DECISION MAKING: Unstable/unpredictable  EVALUATION COMPLEXITY: High   GOALS: Goals reviewed with patient? Yes  SHORT TERM GOALS: Target date: 07/30/2024, extended to 10/04/2024  Patient will be independent with initial HEP. Baseline: To be established 07/05/24 - consistent with current HEP  07/12/24 - clarified HEP frequency Goal status: MET - 08/06/24   2.  Patient will demonstrate decreased fall risk by scoring < 13.5 sec on normal TUG. Baseline: 15.84 sec w/o AD 08/06/24 - 19.88 sec with platform 4WW (pt misjudged seat and attempted to sit on armrest of chair), 23.06 sec w/o AD 08/13/24 - 21.01 sec with platform 4WW 09/05/24 - 40.50 sec with platform 4WW Goal status: IN PROGRESS - 09/27/24 - 26.93 sec with platform 4WW  3.  Patient will be educated on strategies to decrease risk of falls.  Baseline:  Goal status: MET - 07/09/24   LONG TERM GOALS: Target date: 09/10/2024, extended to 11/01/2024  Patient will be independent with ongoing/advanced HEP for self-management at home incorporating PWR! Moves as indicated .  Baseline:  08/13/24 - met for current HEP  Goal status: IN PROGRESS - 09/25/24 - HEP reviewed and updated  2.  Patient will be able to ambulate 600' with LRAD with good safety to access community.  Baseline:  Goal status: IN PROGRESS - 08/09/24 - 270' with standing 4WW  3.  Patient will be able to step up/down curb safely with LRAD for safety with community ambulation.  Baseline:  Goal status: IN PROGRESS - 08/16/24 - pt reports he does not attempt curbs with platform 4WW as walker too heavy to navigate up curb   4.  Patient will demonstrate gait speed of >/= 2.62 ft/sec (0.8 m/s) to be a safe limited community ambulator with decreased risk for recurrent falls.   Baseline: 1.80 ft/sec w/o AD, 1.98 ft/sec with SPC and 2.22 ft/sec with platform RW 07/19/24 - 2.26 ft/sec 08/16/24 - 2.16 ft/sec (normal speed), 2.37 ft/sec (fastest comfortable speed) with upright/platform 4WW 09/06/24 - 1.15 ft/sec with platform 4WW Goal status: IN PROGRESS - 09/27/24 - 1.92 ft/sec with platform 4WW  5.  Patient will improve 5x STS time to </= 15 seconds w/o need for UE assist to demonstrate improved functional strength and transfer efficiency. Baseline: unable w/o UE assist - only able to complete 4 reps in 34.03 sec; 20.18 sec with B UE assist 08/06/24 - 20.50 sec with B UE assist Goal status: IN PROGRESS - 10/04/24- see treatment  6.  Patient will demonstrate at least 19/24 on DGI to improve gait stability and reduce risk for falls. Baseline: 12/24 (06/26/24) Goal status: IN  PROGRESS - 08/13/24 - 16/24; 09/19/24 16/24  7.  Patient will improve Berg score by at least 8 points to improve safety and stability with ADLs in standing and reduce risk for falls. (MCID = 8 points)   Baseline: 41/56 (06/26/24) Goal status: IN PROGRESS - 08/13/24 - 47/56; 09/19/24- 43/56  8.  Patient will report >/= 52% on ABC scale to demonstrate improved balance confidence and decreased risk for falls. Baseline: 620 / 1600 = 38.8 % 08/13/24 - 810 / 1600 = 50.6 % Goal status: IN PROGRESS - 09/06/24 - 160 / 1600 = 10.0 %  9. Patient will verbalize understanding of local Parkinson's disease community resources, including community fitness post d/c. Baseline:  Goal status: IN PROGRESS - 08/16/24 - handouts provided   PLAN:  PT FREQUENCY: 2x/week  PT DURATION: 12 weeks  PLANNED INTERVENTIONS: 97164- PT Re-evaluation, 97750- Physical Performance Testing, 97110-Therapeutic exercises, 97530- Therapeutic activity, [id number]- Neuromuscular re-education, 97535- Self Care, [id number]- Manual therapy, [id number]- Gait training, [phone]- Canalith repositioning, [id number]- Electrical stimulation (unattended), 97035- Ultrasound,  02966- Ionotophoresis 4mg /ml Dexamethasone , 79439 (1-2 muscles), 20561 (3+ muscles)- Dry Needling, Patient/Family education, Balance training, Stair training, Taping, Joint mobilization, Vestibular training, DME instructions, Cryotherapy, and Moist heat  PLAN FOR NEXT SESSION: continue LE strengthening; balance activities (both static and dynamic); PWR! Moves    Sol LITTIE Gaskins, PTA  10/04/2024, 1:55 PM       Date of referral: 05/31/24 Referring provider: Amon Aloysius BRAVO, MD Referring diagnosis?  G21.11,T43.505A (ICD-10-CM) - Neuroleptic-induced parkinsonism (HCC)  R26.9 (ICD-10-CM) - Gait disorder  R29.6 (ICD-10-CM) - Multiple falls   Treatment diagnosis? (if different than referring diagnosis)  Muscle weakness (generalized)  Unsteadiness on feet  Other abnormalities of gait and mobility  History of falling  What was this (referring dx) caused by? Ongoing Issue and Other: neurodegenerative disease  Nature of Condition: Chronic (continuous duration > 3 months)   Laterality: Both  Current Functional Measure Score: Other ABC Scale:640 / 1600 = 40.0 %    Objective measurements identify impairments when they are compared to normal values, the uninvolved extremity, and prior level of function.  [x]  Yes  []  No  Objective assessment of functional ability: Moderate functional limitations   Briefly describe symptoms:  Ryan Zhang is progressing with PT demonstrating improving tolerance for standing activities, improving gait safety and speed with his platform 4WW, and decreasing fall risk per improvements in Berg from 41/56 to 47/56 (indicating a reduction in fall risk from significant (greater than 80%) to moderate moderate (>50%) and DGI from 12/24 to 16/24 (scores of <19/24 still indicating high risk for falls) today.  He has not had any falls in the past month.  Ryan Zhang will benefit from continued skilled PT to address ongoing motor control, strength and balance deficits to improve mobility  and activity tolerance with decreased pain interference and decreased risk for falls.   How did symptoms start: gradual onset  Average pain intensity:  Last 24 hours: 3-4/10  Past week: 3-4/10  How often does the pt experience symptoms? Constantly  How much have the symptoms interfered with usual daily activities? Quite a bit  How has condition changed since care began at this facility? Better   In general, how is the patients overall health? Fair  Onset date: Parkinsonism diagnosis in 2013, worsening weakness over the past 6 months    BACK PAIN (STarT Back Screening Tool) - (When applicable):   Has your back pain spread down your leg(s) at sometime in the last 2  weeks? []  Yes   [x]  No Have you had pain in the shoulder or neck at sometime in the past 2 weeks? [x]  Yes   []  No Have you only walked short distances because of your back pain? []  Yes   [x]  No In the past 2 weeks, have you dressed more slowly than usual because of your back pain? []  Yes   [x]  No Do you think it is not really safe for person with a condition like yours to be physically active? []  Yes   [x]  No Have worrying thoughts been going through your mind a lot of the time? [x]  Yes   []  No Do you feel that your back pain is terrible and it is never going to get any better? []  Yes   [x]  No In general, have you stopped enjoying all the things you usually enjoy? []  Yes   [x]  No Overall, how bothersome has your back pain been in the last 2 weeks? []  Not at all   [x]  Slightly     []  Moderate   []  Very much     []  Extremely

## 2024-10-09 ENCOUNTER — Encounter: Payer: Self-pay | Admitting: Rehabilitation

## 2024-10-09 ENCOUNTER — Encounter: Payer: Self-pay | Admitting: Family

## 2024-10-09 ENCOUNTER — Ambulatory Visit: Admitting: Family

## 2024-10-09 ENCOUNTER — Ambulatory Visit: Admitting: Rehabilitation

## 2024-10-09 DIAGNOSIS — M6281 Muscle weakness (generalized): Secondary | ICD-10-CM | POA: Diagnosis not present

## 2024-10-09 DIAGNOSIS — R2681 Unsteadiness on feet: Secondary | ICD-10-CM

## 2024-10-09 DIAGNOSIS — R2689 Other abnormalities of gait and mobility: Secondary | ICD-10-CM

## 2024-10-09 DIAGNOSIS — Z09 Encounter for follow-up examination after completed treatment for conditions other than malignant neoplasm: Secondary | ICD-10-CM

## 2024-10-09 LAB — BASIC METABOLIC PANEL WITH GFR
BUN: 26 mg/dL — ABNORMAL HIGH (ref 6–23)
CO2: 28 meq/L (ref 19–32)
Calcium: 9.4 mg/dL (ref 8.4–10.5)
Chloride: 103 meq/L (ref 96–112)
Creatinine, Ser: 0.94 mg/dL (ref 0.40–1.50)
GFR: 78.83 mL/min (ref >60.00)
Glucose, Bld: 172 mg/dL — ABNORMAL HIGH (ref 70–99)
Potassium: 4.6 meq/L (ref 3.5–5.1)
Sodium: 140 meq/L (ref 135–145)

## 2024-10-09 LAB — MAGNESIUM: Magnesium: 1.7 mg/dL (ref 1.5–2.5)

## 2024-10-09 NOTE — Patient Instructions (Signed)
 Hold simvastatin  40 mg x 1 week if muscle weakness does not improve, resume simvastatin .

## 2024-10-09 NOTE — Therapy (Signed)
 OUTPATIENT PHYSICAL THERAPY TREATMENT     Patient Name: Ryan Zhang MRN: [id number] DOB:10-14-1946, 78 y.o., male Today's Date: 10/09/2024   END OF SESSION:  PT End of Session - 10/09/24 1532     Visit Number 19    Date for Recertification  11/01/24    Authorization Type [employer]    Authorization Time Period 09/06/24- 10/04/24    Authorization - Number of Visits 8    Progress Note Due on Visit 22   Recert/PN on visit #12 (09/06/24)   PT Start Time 1530    PT Stop Time 1615    PT Time Calculation (min) 45 min    Activity Tolerance Patient limited by fatigue    Behavior During Therapy WFL for tasks assessed/performed                   Past Medical History:  Diagnosis Date   Allergy    allergy shots Dr. Cloretta   Anemia    Anxiety    Asthma    moderate persistant   Bipolar affective (HCC)    Cataract    both eyes   Clotting disorder    Due to blood thinner   COPD (chronic obstructive pulmonary disease) (HCC)    Depression    Diabetes mellitus    DJD (degenerative joint disease)    Dysphagia    Gallstones    hx of, s/p cholecystectomy   GERD (gastroesophageal reflux disease)    Hepatitis A    as teenager 60's   Hollenhorst plaque    right eye   Hyperlipidemia    Hypertension    Kidney stones    hx of   Meniere's disease    Motility disorder, esophageal    Obesity    Pancreatitis    Personal history of colonic polyps 11/2004   hyperplastic.   Stroke [facility])    per MRI   Past Surgical History:  Procedure Laterality Date   CATARACT EXTRACTION Bilateral 09/2015 and 10/2015   CHOLECYSTECTOMY     COLONOSCOPY     EYE SURGERY     JOINT REPLACEMENT     TOTAL KNEE ARTHROPLASTY Bilateral    both knees   Patient Active Problem List   Diagnosis Date Noted   Hypomagnesemia 09/08/2024   Tinea cruris 07/24/2020   PCP NOTES >>>>>>>>>>>>>>>>>>>>>>>>>>>>>>> 01/12/2016   Folliculitis 05/09/2015   Hearing loss in right ear 08/05/2014   Panic  attack as reaction to stress 04/20/2012   Dysphagia, neurologic 01/07/2012   Morbid obesity (HCC) 01/07/2012   Chest pain, musculoskeletal 12/07/2011   Candidiasis of skin 08/12/2011   Annual physical exam 06/07/2011   OLECRANON BURSITIS 01/29/2011   PARTIAL ARTERIAL OCCLUSION OF RETINA 05/27/2010   Essential hypertension, benign 01/16/2010   ABNORMAL ELECTROCARDIOGRAM 01/14/2010   Asthma 10/14/2009   GERD 10/14/2009   Bipolar disorder (HCC) 02/20/2009   PHIMOSIS 02/20/2009   KNEE PAIN, CHRONIC 11/20/2008   HIP PAIN, RIGHT 08/19/2008   DM type 2 with diabetic peripheral neuropathy (HCC) 02/22/2007   Hyperlipidemia associated with type 2 diabetes mellitus (HCC) 02/22/2007   ALLERGIC RHINITIS, SEASONAL 02/22/2007   LOW BACK PAIN, CHRONIC 02/22/2007   MENIERE'S DISEASE, HX OF 02/22/2007    PCP: Amon Aloysius BRAVO, MD   REFERRING PROVIDER: Amon Aloysius BRAVO, MD  (Neurologist - Evonnie Stabs, MD)  REFERRING DIAG:  [phone] (ICD-10-CM) - Neuroleptic-induced parkinsonism (HCC)  R26.9 (ICD-10-CM) - Gait disorder  R29.6 (ICD-10-CM) - Multiple falls   THERAPY DIAG:  Muscle  weakness (generalized)  Unsteadiness on feet  Other abnormalities of gait and mobility  RATIONALE FOR EVALUATION AND TREATMENT: Rehabilitation  ONSET DATE: ~2013 - 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    PAIN: Are you having pain? Yes: NPRS scale: 1/10 Pain location: L shoulder  PERTINENT HISTORY:  Anxiety, asthma, bipolar affective disorder, COPD, depression, DM-II, DJD, chronic knee pain, B TKA, GERD, HTN,  Mnire's disease, obesity, pancreatitis, h/o CVA, neurologic dysphagia, tardive dyskinesia, HOH - L deaf & R hearing aid  PRECAUTIONS: Fall  RED FLAGS: None  WEIGHT BEARING RESTRICTIONS: No  FALLS:  Has patient fallen in last 6 months? No and before the levodopa  supplements, falls were daily (sometimes several times a day)  LIVING ENVIRONMENT: Lives with: lives with their spouse and dog Lives in: Apartment Stairs: No Has following equipment at home: Single point cane, Environmental Consultant - 2 wheeled, shower chair, and Grab bars (platform RW)  OCCUPATION: Retired  PLOF: Independent, Independent with community mobility with device, and Leisure: watching TV, listening to books, programming, walking ~15 min (around the block) - tries to do daily   PATIENT GOALS: To get back to the point where I can walk to the car unassisted (no AD) and use platform RW less in community.  Be able to assist my wife with bringing groceries in from the car.   OBJECTIVE: (objective measures completed at initial evaluation unless otherwise dated)  DIAGNOSTIC FINDINGS:  N/A - Multiple imaging studies in 2024 s/p falls but no acute fractures.  COGNITION: Overall cognitive status: Impaired and History of cognitive impairments - at baseline - mostly memory issues resulting from ECT 19 yrs ago, better over last 3 months but still notes limited STM   SENSATION: WFL  COORDINATION: B Heel/toe WFL Heel to shin unable bilaterally   POSTURE:  rounded shoulders, forward head, and flexed trunk   MUSCLE LENGTH: Hamstrings: mod/severe tight B Piriformis: mild /mod tight R>L Hip flexors: mod tight   LOWER EXTREMITY ROM:    Grossly WFL except limited hip extension and rotation bilaterally  LOWER EXTREMITY MMT:    MMT Right eval Left eval R 09/06/24 L  09/06/24  Hip flexion 3+ 3+ 3+ 3+  Hip extension 3+ 3+ 3+ 3+  Hip abduction 3- 3 3- 3-  Hip adduction 3+ 3+ 3- 3-  Hip internal rotation 4- 3+ 4- 4-  Hip external  rotation 4- 3+ 4- 4-  Knee flexion 4 4 4- 4  Knee extension 4 4 4- 4  Ankle dorsiflexion 4- 4- 4- 4  Ankle plantarflexion      Ankle inversion      Ankle eversion      (Blank rows = not tested)  BED MOBILITY:  SBA  TRANSFERS: Assistive device utilized: None  Sit to stand: Modified independence and SBA Stand to sit: Modified independence and SBA Chair to chair: SBA Floor: NT  GAIT: Distance walked: clinic distances Assistive device utilized: Single point cane, platform RW/2WW, and None Level of assistance: SBA Gait pattern: step through pattern, decreased arm swing- Right, decreased arm swing- Left, decreased stride length, decreased hip/knee flexion- Right, decreased hip/knee flexion- Left, decreased ankle dorsiflexion- Right, decreased ankle dorsiflexion- Left, shuffling, festinating, trunk flexed, poor foot clearance- Right, and poor foot clearance- Left Comments: gait deviations more pronounced with decreasing AD support  FUNCTIONAL TESTS:  5 times sit to stand: unable w/o UE assist - only able to complete 4 reps in 34.03 sec; 20.18 sec with B UE assist Timed up and go (TUG): Normal - 15.84 sec w/o AD, Manual - 15.19 sec w/o AD, Cognitive - 15.66 sec w/o AD 10 meter walk test: 18.25 sec w/o AD, 16.56 sec with SPC, 14.78 sec with platform RW Gait speed: 1.80 ft/sec w/o AD, 1.98 ft/sec with SPC, 2.22 ft/sec with platform RW Berg balance scale: 41/56, 37-45 indicates significant (>80%) risk for falls (06/26/24) DGI: 12/24, <19/24 indicates high risk for falls (06/26/24)  PATIENT SURVEYS:  ABC scale: 620 / 1600 = 38.8 %, indicating a low level of physical functioning (<69% indicates risk for recurrent falls in PD) 09/06/24 - ABC scale: 09/06/24 - 160 / 1600 = 10.0 %   TODAY'S TREATMENT:  10/09/24 THERAPEUTIC EXERCISE: To improve strength and endurance.  Demonstration, verbal and tactile cues throughout for technique.  NuStep - L6 x 6 min (LE only)  Powr! Moves Seated: 2/5  each  Power rock  Power step  Power twist  Power up  Standing:  2/5 each  Power rock  Power step  Power twist  Power up  Standing reciprocal arm swings x 10 BUE w/ PT assist Standing trunk rotations x 10 BUE w/ PT assist Pool noodle step overs S/S X 10 each;  F/B X 2/5 each  THERAPEUTIC ACTIVITIES: To improve functional performance.  Demonstration, verbal and tactile cues throughout for technique. Seated hip ER YTB x 20 BLE   10/04/2024 THERAPEUTIC EXERCISE: To improve strength and endurance.  Demonstration, verbal and tactile cues throughout for technique.  NuStep - L6 x 6 min (LE only) 5xSTS- first trial no hands 42 seconds; 2nd trial with hands- 25.28 seconds ABC scale DGI  [facility] PT Assessment - 10/04/24 0001       Dynamic Gait Index   Level Surface Moderate Impairment    Change in Gait Speed Mild Impairment    Gait with Horizontal Head Turns Mild Impairment    Gait with Vertical Head Turns Mild Impairment    Gait and Pivot Turn Normal    Step Over Obstacle Mild Impairment    Step Around Obstacles Normal    Steps Moderate Impairment    Total Score 16  10/02/2024 THERAPEUTIC EXERCISE: To improve strength and endurance.  Demonstration, verbal and tactile cues throughout for technique.  NuStep - L5 x 6 min (LE only)  THERAPEUTIC ACTIVITIES: To improve functional performance.  Demonstration, verbal and tactile cues throughout for technique. Standing at wall ladder for support: Alt hip extension 2 x 10 Alt hip ABD 2 x 10 Alt hip flexion march 2 x 10 Alt knee flexion/HS curl 2 x 10 Heel-toe raises 2 x 10 Mini-squat 2 x 10 Step stance lunge 4 x 20 BLE Step ups BLE 4 x 10  Step over yard stick and back forward x 10B; lateral x 10 B   09/27/2024 THERAPEUTIC EXERCISE: To improve strength and endurance.  Demonstration, verbal and tactile cues throughout for technique.  NuStep - L6 x 6 min (LE only) Seated alt LAQ 2 x 10  THERAPEUTIC ACTIVITIES: To  improve functional performance.  Demonstration, verbal and tactile cues throughout for technique. Standing at wall ladder for support: Alt hip extension 2 x 10 Alt hip ABD 2 x 10 Alt hip flexion march 2 x 10 Alt knee flexion/HS curl 2 x 10 Heel-toe raises 2 x 10 Mini-squat 2 x 10 = 17.07 sec with platform 4WW Gait speed = 1.92 ft/sec with platform 4WW TUG = 26.93 sec with platform 4WW Alt fwd step-over and back using yardstick as target to increase step length and foot clearance - single UE support on counter for balance  GAIT TRAINING: To normalize gait pattern and improve safety with platform 4WW.  400' with platform [phone] - cues for increased stride length and increased hip and knee flexion to reduce shuffling with gait and reduce tripping hazard   09/25/2024  THERAPEUTIC EXERCISE: To improve strength and endurance.  Demonstration, verbal and tactile cues throughout for technique.  NuStep - L5 x 6 min (started with B UE/LE, but shifted to LE only d/t irritating UE pain) Seated GTB B hip ABD/ER clam 2 x 10 Seated GTB alt hip flexion march 2 x 10  THERAPEUTIC ACTIVITIES: To improve functional performance.  Demonstration, verbal and tactile cues throughout for technique. Orthostatic BP assessment:  Time BP HR Symptoms  Lying down 5 min 132/70 84 No dizziness  Sitting 1-2 min 112/60 83 Spinning dizziness  Standing 1-2 min 104/56 91 Spinning dizziness  Sit to stand w/o UE assist from slightly elevated treatment table focusing on increased fwd weight shift to reduce posterior LOB.  SELF CARE: Provided education to reduce fall risk.  Provided education on orthostatic/postural hypotension including symptoms, causes and strategies to minimize symptoms and reduce risk for falls: Need for adequate hydration  Slow transitions with pause to ensure no dizziness or that dizziness has resolved Gentle LE exercises before initiation of movement   09/19/24 THERAPEUTIC EXERCISE: To  improve strength and endurance.  Demonstration, verbal and tactile cues throughout for technique. NuStep - L5 x 6 min (LE only) Seated LAQ BLE  10 Seated marching BLE x 10  NEUROMUSCULAR RE-EDUCATION:  Berg Balance Test  Sit to Stand Able to stand without using hands and stabilize independently   Standing Unsupported Able to stand safely 2 minutes   Sitting with Back Unsupported but Feet Supported on Floor or Stool Able to sit safely and securely 2 minutes   Stand to Sit Sits safely with minimal use of hands   Transfers Able to transfer safely, minor use of hands   Standing Unsupported with Eyes Closed Able to stand 10 seconds safely   Standing Unsupported with Feet  Together Able to place feet together independently and stand 1 minute safely   From Standing, Reach Forward with Outstretched Arm Can reach confidently >25 cm (10)   From Standing Position, Pick up Object from Floor Able to pick up shoe safely and easily   From Standing Position, Turn to Look Behind Over each Shoulder Looks behind from both sides and weight shifts well   Turn 360 Degrees Able to turn 360 degrees safely but slowly   Standing Unsupported, Alternately Place Feet on Step/Stool Needs assistance to keep from falling or unable to try   Standing Unsupported, One Foot in Front Needs help to step but can hold 15 seconds   Standing on One Leg Unable to try or needs assist to prevent fall   Total Score 43   Interpretation: 37-45 indicates significant (>80%) risk for falls (06/26/24)      09/18/24 THERAPEUTIC EXERCISE: To improve strength and endurance.  Demonstration, verbal and tactile cues throughout for technique. NuStep - L5 x 6 min (LE only) NEUROMUSCULAR RE-EDUCATION:  Seated on dynadisk hands on table for support: Sitting balance LAQ x 5 BLE- very difficult Perturbations 4 ways Marching x 5 BLE  Gait training:  270 with roller walker - some R foot drag, very short steps B  Sit to stands with chair in  front very challenging 3x lots of effort standing even with support  09/06/2024 - Recertification THERAPEUTIC ACTIVITIES: To improve functional performance.  Demonstration, verbal and tactile cues throughout for technique. LE MMT 5xSTS = 32.06 sec with B UE assist TUG = 40.50 sec with platform 4WW ABC Scale: 09/06/24 - 160 / 1600 = 10.0 % = 28.63 sec with platform 4WW Gait speed = 1.15 ft/sec with platform 4WW Goal assessment   PATIENT EDUCATION:  Education details: HEP review, HEP progression - advanced seated march to GTB, and symptoms and management of orthostatic/postural hypotension  Person educated: Patient Education method: Explanation, Demonstration, Verbal cues, Handouts, and MedBridge[employer] app updated Education comprehension: verbalized understanding, returned demonstration, verbal cues required, and needs further education  HOME EXERCISE PROGRAM: *Pt has MedBridgeGO app access.   Access Code: [email or web address] URL: [email or web address] Date: 10/02/2024 Prepared by: Braylin Clark  Exercises - Seated Hip Abduction with Resistance  - 1 x daily - 3 x weekly - 3 sets - 10 reps - Seated Hip Adduction Isometrics with Ball  - 1 x daily - 3 x weekly - 3 sets - 10 reps - Seated March with Resistance  - 1 x daily - 3 x weekly - 3 sets - 10 reps - Sit to Stand  - 1 x daily - 3 x weekly - 1 sets - 5 reps - Seated Hamstring Stretch  - 1-2 x daily - 7 x weekly - 3 reps - 30 sec hold - Seated Figure 4 Piriformis Stretch  - 1-2 x daily - 7 x weekly - 3 reps - 30 sec hold - Seated Hip Flexor Stretch  - 1-2 x daily - 7 x weekly - 3 reps - 30 sec hold - Standing Hip Abduction with Counter Support  - 1 x daily - 7 x weekly - 2 sets - 10 reps - Standing Hip Extension with Counter Support  - 1 x daily - 7 x weekly - 2 sets - 10 reps - Standing March with Counter Support  - 1 x daily - 7 x weekly - 2 sets - 10 reps - Heel Raises with Counter Support  - 1 x daily - 7  x weekly - 2  sets - 10 reps  Patient Education - Check for Safety - Orthostatic Hypotension - Postural Hypotension  Patient Education - Check for Safety - Orthostatic Hypotension - Postural Hypotension  PWR Moves: - Sitting - Standing (modified)   ASSESSMENT:  CLINICAL IMPRESSION: Patient tolerates PT fair, but fatigues very quickly requiring lots of rest breaks.    He works hard with PT, but has B shoulder pain which causes difficulty completing PWR Moves involving shoulder flexion.  He has intermittent R hip and knee buckling in standing, but is able to catch himself so long as he has UE support.   Stepping over objects is very difficult for him.   PT remains necessary for balance, gait, balance, strength deficits.   Continue per POC  Today we reassessed DGI, 5xSTS, and ABC scale. Pt scored 16/24 on the DGI which still indicates a risk for falls, along with seeing score of 43/56 on BERG on 09/19/24, which shows moderate risk for falls. His score on 5xSTS has improved but with using UE support and he also required some assistance with standing at times. ABC scale score has went down 10 points since the last time assessed. We have seen halt in progress due to recent medical issues, showing decline in balance test scores and functional capacity.  Patricio will benefit from continued skilled PT to address ongoing functional strength, and balance deficits to improve mobility and activity tolerance with decreased pain interference and decreased risk for falls.    OBJECTIVE IMPAIRMENTS: Abnormal gait, decreased activity tolerance, decreased balance, decreased coordination, decreased endurance, decreased knowledge of condition, decreased knowledge of use of DME, decreased mobility, difficulty walking, decreased strength, decreased safety awareness, dizziness, impaired perceived functional ability, impaired flexibility, improper body mechanics, postural dysfunction, and pain.   ACTIVITY LIMITATIONS: carrying,  lifting, bending, standing, squatting, stairs, transfers, bed mobility, reach over head, locomotion level, and caring for others  PARTICIPATION LIMITATIONS: meal prep, cleaning, laundry, shopping, and community activity  PERSONAL FACTORS: Age, Fitness, Past/current experiences, Time since onset of injury/illness/exacerbation, and 3+ comorbidities: Anxiety, asthma, bipolar affective disorder, COPD, depression, DM-II, DJD, chronic knee pain, B TKA, GERD, HTN, Mnire's disease, obesity, pancreatitis, h/o CVA, neurologic dysphagia, tardive dyskinesia, HOH - L deaf & R hearing aid are also affecting patient's functional outcome.   REHAB POTENTIAL: Good  CLINICAL DECISION MAKING: Unstable/unpredictable  EVALUATION COMPLEXITY: High   GOALS: Goals reviewed with patient? Yes  SHORT TERM GOALS: Target date: 07/30/2024, extended to 10/04/2024  Patient will be independent with initial HEP. Baseline: To be established 07/05/24 - consistent with current HEP  07/12/24 - clarified HEP frequency Goal status: MET - 08/06/24   2.  Patient will demonstrate decreased fall risk by scoring < 13.5 sec on normal TUG. Baseline: 15.84 sec w/o AD 08/06/24 - 19.88 sec with platform 4WW (pt misjudged seat and attempted to sit on armrest of chair), 23.06 sec w/o AD 08/13/24 - 21.01 sec with platform 4WW 09/05/24 - 40.50 sec with platform 4WW Goal status: IN PROGRESS - 09/27/24 - 26.93 sec with platform 4WW  3.  Patient will be educated on strategies to decrease risk of falls.  Baseline:  Goal status: MET - 07/09/24   LONG TERM GOALS: Target date: 09/10/2024, extended to 11/01/2024  Patient will be independent with ongoing/advanced HEP for self-management at home incorporating PWR! Moves as indicated .  Baseline:  08/13/24 - met for current HEP  Goal status: IN PROGRESS - 09/25/24 - HEP reviewed and updated  2.  Patient will be able to ambulate 600' with LRAD with good safety to access community.  Baseline:   Goal status: IN PROGRESS - 08/09/24 - 270' with standing 4WW  3.  Patient will be able to step up/down curb safely with LRAD for safety with community ambulation.  Baseline:  Goal status: IN PROGRESS - 08/16/24 - pt reports he does not attempt curbs with platform 4WW as walker too heavy to navigate up curb   4.  Patient will demonstrate gait speed of >/= 2.62 ft/sec (0.8 m/s) to be a safe limited community ambulator with decreased risk for recurrent falls.  Baseline: 1.80 ft/sec w/o AD, 1.98 ft/sec with SPC and 2.22 ft/sec with platform RW 07/19/24 - 2.26 ft/sec 08/16/24 - 2.16 ft/sec (normal speed), 2.37 ft/sec (fastest comfortable speed) with upright/platform 4WW 09/06/24 - 1.15 ft/sec with platform 4WW Goal status: IN PROGRESS - 09/27/24 - 1.92 ft/sec with platform 4WW  5.  Patient will improve 5x STS time to </= 15 seconds w/o need for UE assist to demonstrate improved functional strength and transfer efficiency. Baseline: unable w/o UE assist - only able to complete 4 reps in 34.03 sec; 20.18 sec with B UE assist 08/06/24 - 20.50 sec with B UE assist Goal status: IN PROGRESS - 10/04/24- see treatment  6.  Patient will demonstrate at least 19/24 on DGI to improve gait stability and reduce risk for falls. Baseline: 12/24 (06/26/24) Goal status: IN PROGRESS - 08/13/24 - 16/24; 09/19/24 16/24  7.  Patient will improve Berg score by at least 8 points to improve safety and stability with ADLs in standing and reduce risk for falls. (MCID = 8 points)   Baseline: 41/56 (06/26/24) Goal status: IN PROGRESS - 08/13/24 - 47/56; 09/19/24- 43/56  8.  Patient will report >/= 52% on ABC scale to demonstrate improved balance confidence and decreased risk for falls. Baseline: 620 / 1600 = 38.8 % 08/13/24 - 810 / 1600 = 50.6 % Goal status: IN PROGRESS - 09/06/24 - 160 / 1600 = 10.0 %  9. Patient will verbalize understanding of local Parkinson's disease community resources, including community fitness post  d/c. Baseline:  Goal status: IN PROGRESS - 08/16/24 - handouts provided   PLAN:  PT FREQUENCY: 2x/week  PT DURATION: 12 weeks  PLANNED INTERVENTIONS: [id number]- PT Re-evaluation, 97750- Physical Performance Testing, 97110-Therapeutic exercises, 97530- Therapeutic activity, [id number]- Neuromuscular re-education, 97535- Self Care, [id number]- Manual therapy, [id number]- Gait training, [phone]- Canalith repositioning, [id number]- Electrical stimulation (unattended), 97035- Ultrasound, [id number]- Ionotophoresis 4mg /ml Dexamethasone , 79439 (1-2 muscles), 20561 (3+ muscles)- Dry Needling, Patient/Family education, Balance training, Stair training, Taping, Joint mobilization, Vestibular training, DME instructions, Cryotherapy, and Moist heat  PLAN FOR NEXT SESSION: update ABC?;  continue LE strengthening; balance activities (both static and dynamic); PWR! Moves    [place], PT  10/09/2024, 4:32 PM       Date of referral: 05/31/24 Referring provider: Amon Aloysius BRAVO, MD Referring diagnosis?  G21.11,T43.505A (ICD-10-CM) - Neuroleptic-induced parkinsonism (HCC)  R26.9 (ICD-10-CM) - Gait disorder  R29.6 (ICD-10-CM) - Multiple falls   Treatment diagnosis? (if different than referring diagnosis)  Muscle weakness (generalized)  Unsteadiness on feet  Other abnormalities of gait and mobility  What was this (referring dx) caused by? Ongoing Issue and Other: neurodegenerative disease  Nature of Condition: Chronic (continuous duration > 3 months)   Laterality: Both  Current Functional Measure Score: Other ABC Scale:640 / 1600 = 40.0 %    Objective measurements identify impairments when they are compared to normal  values, the uninvolved extremity, and prior level of function.  [x]  Yes  []  No  Objective assessment of functional ability: Moderate functional limitations   Briefly describe symptoms:  Cordarrius is progressing with PT demonstrating improving tolerance for standing activities, improving gait safety and  speed with his platform 4WW, and decreasing fall risk per improvements in Berg from 41/56 to 47/56 (indicating a reduction in fall risk from significant (greater than 80%) to moderate moderate (>50%) and DGI from 12/24 to 16/24 (scores of <19/24 still indicating high risk for falls) today.  He has not had any falls in the past month.  Thailan will benefit from continued skilled PT to address ongoing motor control, strength and balance deficits to improve mobility and activity tolerance with decreased pain interference and decreased risk for falls.   How did symptoms start: gradual onset  Average pain intensity:  Last 24 hours: 3-4/10  Past week: 3-4/10  How often does the pt experience symptoms? Constantly  How much have the symptoms interfered with usual daily activities? Quite a bit  How has condition changed since care began at this facility? Better   In general, how is the patients overall health? Fair  Onset date: Parkinsonism diagnosis in 2013, worsening weakness over the past 6 months    BACK PAIN (STarT Back Screening Tool) - (When applicable):   Has your back pain spread down your leg(s) at sometime in the last 2 weeks? []  Yes   [x]  No Have you had pain in the shoulder or neck at sometime in the past 2 weeks? [x]  Yes   []  No Have you only walked short distances because of your back pain? []  Yes   [x]  No In the past 2 weeks, have you dressed more slowly than usual because of your back pain? []  Yes   [x]  No Do you think it is not really safe for person with a condition like yours to be physically active? []  Yes   [x]  No Have worrying thoughts been going through your mind a lot of the time? [x]  Yes   []  No Do you feel that your back pain is terrible and it is never going to get any better? []  Yes   [x]  No In general, have you stopped enjoying all the things you usually enjoy? []  Yes   [x]  No Overall, how bothersome has your back pain been in the last 2 weeks? []  Not at all    [x]  Slightly     []  Moderate   []  Very much     []  Extremely

## 2024-10-09 NOTE — Progress Notes (Signed)
 Acute Office Visit  Subjective:       Patient ID: Ryan Zhang, male    DOB: November 22, 1945, 78 y.o.   MRN: [id number]  Chief Complaint  Patient presents with  . Hospitalization Follow-up    Patient was recently in the ER on 09/08/24. Legs felt weak and uncontrollable: Low Magnesium     HPI Patient is in today as a hospital follow-up from 09/08/2024 he presented after feeling weakness and having an increase in falls.  At the ED he was found to have magnesium  of 1.4 that was replaced and he began to feel better.  Patient reports that he eats about 1 meal a day and a small snack.  Does not have much of an appetite.  Overall muscle weakness is better today.  However, he is concerned that there may be an underlying cause for the weakness that waxes and wanes.  Patient is under the care of of neurology and goes to physical therapy twice a week.  Reports father saying that he had manage myasthenia gravis but was never officially seen or treated that he is aware of. Review of Systems  Constitutional: Negative.   Respiratory: Negative.    Cardiovascular: Negative.   Gastrointestinal: Negative.   Musculoskeletal:        Muscle weakness  Skin: Negative.   Neurological:  Positive for weakness. Negative for dizziness.  Endo/Heme/Allergies: Negative.   Psychiatric/Behavioral: Negative.    All other systems reviewed and are negative.       Objective:    BP (!) 146/78 (BP Location: Right Arm, Patient Position: Sitting, Cuff Size: Large)   Pulse 84   Temp 97.6 F (36.4 C) (Oral)   Ht 5' 10 (1.778 m)   Wt 211 lb 3.2 oz (95.8 kg)   SpO2 94%   BMI 30.30 kg/m    Physical Exam Vitals and nursing note reviewed.  Constitutional:      Appearance: Normal appearance.  Cardiovascular:     Rate and Rhythm: Normal rate and regular rhythm.     Pulses: Normal pulses.     Heart sounds: Normal heart sounds.  Pulmonary:     Effort: Pulmonary effort is normal.     Breath sounds: Normal breath  sounds.  Abdominal:     General: Abdomen is flat. Bowel sounds are normal.     Palpations: Abdomen is soft.  Musculoskeletal:        General: Normal range of motion.     Cervical back: Normal range of motion and neck supple.     Comments: At baseline  Skin:    General: Skin is warm and dry.  Neurological:     General: No focal deficit present.     Mental Status: He is alert and oriented to person, place, and time. Mental status is at baseline.  Psychiatric:        Mood and Affect: Mood normal.        Behavior: Behavior normal.        Thought Content: Thought content normal.     No results found for any visits on 10/09/24.      Assessment & Plan:   Problem List Items Addressed This Visit     Hypomagnesemia - Primary   Relevant Orders   Magnesium    Basic Metabolic Panel (BMET)   Other Visit Diagnoses       Hospital discharge follow-up         Muscle weakness       Relevant Orders  Magnesium    Basic Metabolic Panel (BMET)       No orders of the defined types were placed in this encounter. Hold simvastatin  40 mg x 1 week to see if this helps with the muscle weakness.  Labs obtained today will notify patient pending results and follow-up with further treatment plan thereafter.  Schedule a follow-up appointment with Dr. Amon in 3 months.  No follow-ups on file.  Shea Swalley B Nitya Cauthon, FNP

## 2024-10-10 ENCOUNTER — Ambulatory Visit: Payer: Self-pay | Admitting: Family

## 2024-10-10 LAB — OPHTHALMOLOGY REPORT-SCANNED

## 2024-10-15 ENCOUNTER — Ambulatory Visit: Attending: Internal Medicine | Admitting: Physical Therapy

## 2024-10-15 ENCOUNTER — Encounter: Payer: Self-pay | Admitting: Physical Therapy

## 2024-10-15 DIAGNOSIS — R2689 Other abnormalities of gait and mobility: Secondary | ICD-10-CM | POA: Insufficient documentation

## 2024-10-15 DIAGNOSIS — Z9181 History of falling: Secondary | ICD-10-CM | POA: Insufficient documentation

## 2024-10-15 DIAGNOSIS — M6281 Muscle weakness (generalized): Secondary | ICD-10-CM | POA: Diagnosis present

## 2024-10-15 DIAGNOSIS — R2681 Unsteadiness on feet: Secondary | ICD-10-CM | POA: Insufficient documentation

## 2024-10-15 NOTE — Therapy (Signed)
 OUTPATIENT PHYSICAL THERAPY TREATMENT     Patient Name: Ryan Zhang MRN: [id number] DOB:03/26/1946, 78 y.o., male Today's Date: 10/15/2024   END OF SESSION:  PT End of Session - 10/15/24 1100     Visit Number 20    Date for Recertification  11/01/24    Authorization Type [employer]    Authorization Time Period 10/08/24 - 11/05/24    Authorization - Visit Number 2    Authorization - Number of Visits 4    Progress Note Due on Visit 22   Recert/PN on visit #12 (09/06/24)   PT Start Time 1100    PT Stop Time 1149    PT Time Calculation (min) 49 min    Activity Tolerance Patient limited by fatigue    Behavior During Therapy WFL for tasks assessed/performed                    Past Medical History:  Diagnosis Date   Allergy    allergy shots Dr. Cloretta   Anemia    Anxiety    Asthma    moderate persistant   Bipolar affective (HCC)    Cataract    both eyes   Clotting disorder    Due to blood thinner   COPD (chronic obstructive pulmonary disease) (HCC)    Depression    Diabetes mellitus    DJD (degenerative joint disease)    Dysphagia    Gallstones    hx of, s/p cholecystectomy   GERD (gastroesophageal reflux disease)    Hepatitis A    as teenager 60's   Hollenhorst plaque    right eye   Hyperlipidemia    Hypertension    Kidney stones    hx of   Meniere's disease    Motility disorder, esophageal    Obesity    Pancreatitis    Personal history of colonic polyps 11/2004   hyperplastic.   Stroke [facility])    per MRI   Past Surgical History:  Procedure Laterality Date   CATARACT EXTRACTION Bilateral 09/2015 and 10/2015   CHOLECYSTECTOMY     COLONOSCOPY     EYE SURGERY     JOINT REPLACEMENT     TOTAL KNEE ARTHROPLASTY Bilateral    both knees   Patient Active Problem List   Diagnosis Date Noted   Hypomagnesemia 09/08/2024   Tinea cruris 07/24/2020   PCP NOTES >>>>>>>>>>>>>>>>>>>>>>>>>>>>>>> 01/12/2016   Folliculitis 05/09/2015   Hearing loss  in right ear 08/05/2014   Panic attack as reaction to stress 04/20/2012   Dysphagia, neurologic 01/07/2012   Morbid obesity (HCC) 01/07/2012   Chest pain, musculoskeletal 12/07/2011   Candidiasis of skin 08/12/2011   Annual physical exam 06/07/2011   OLECRANON BURSITIS 01/29/2011   PARTIAL ARTERIAL OCCLUSION OF RETINA 05/27/2010   Essential hypertension, benign 01/16/2010   ABNORMAL ELECTROCARDIOGRAM 01/14/2010   Asthma 10/14/2009   GERD 10/14/2009   Bipolar disorder (HCC) 02/20/2009   PHIMOSIS 02/20/2009   KNEE PAIN, CHRONIC 11/20/2008   HIP PAIN, RIGHT 08/19/2008   DM type 2 with diabetic peripheral neuropathy (HCC) 02/22/2007   Hyperlipidemia associated with type 2 diabetes mellitus (HCC) 02/22/2007   ALLERGIC RHINITIS, SEASONAL 02/22/2007   LOW BACK PAIN, CHRONIC 02/22/2007   MENIERE'S DISEASE, HX OF 02/22/2007    PCP: Amon Aloysius BRAVO, MD   REFERRING PROVIDER: Amon Aloysius BRAVO, MD  (Neurologist - Evonnie Stabs, MD)  REFERRING DIAG:  [phone] (ICD-10-CM) - Neuroleptic-induced parkinsonism (HCC)  R26.9 (ICD-10-CM) - Gait disorder  R29.6 (  ICD-10-CM) - Multiple falls   THERAPY DIAG:  Muscle weakness (generalized)  Unsteadiness on feet  Other abnormalities of gait and mobility  History of falling  RATIONALE FOR EVALUATION AND TREATMENT: Rehabilitation  ONSET DATE: ~2013 - 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    PAIN: Are you having pain? Yes: NPRS scale: 4/10 Pain location: L shoulder  PERTINENT HISTORY:  Anxiety, asthma, bipolar affective disorder,  COPD, depression, DM-II, DJD, chronic knee pain, B TKA, GERD, HTN, Mnire's disease, obesity, pancreatitis, h/o CVA, neurologic dysphagia, tardive dyskinesia, HOH - L deaf & R hearing aid  PRECAUTIONS: Fall  RED FLAGS: None  WEIGHT BEARING RESTRICTIONS: No  FALLS:  Has patient fallen in last 6 months? No and before the levodopa  supplements, falls were daily (sometimes several times a day)  LIVING ENVIRONMENT: Lives with: lives with their spouse and dog Lives in: Apartment Stairs: No Has following equipment at home: Single point cane, Environmental Consultant - 2 wheeled, shower chair, and Grab bars (platform RW)  OCCUPATION: Retired  PLOF: Independent, Independent with community mobility with device, and Leisure: watching TV, listening to books, programming, walking ~15 min (around the block) - tries to do daily   PATIENT GOALS: To get back to the point where I can walk to the car unassisted (no AD) and use platform RW less in community.  Be able to assist my wife with bringing groceries in from the car.   OBJECTIVE: (objective measures completed at initial evaluation unless otherwise dated)  DIAGNOSTIC FINDINGS:  N/A - Multiple imaging studies in 2024 s/p falls but no acute fractures.  COGNITION: Overall cognitive status: Impaired and History of cognitive impairments - at baseline - mostly memory issues resulting from ECT 19 yrs ago, better over last 3 months but still notes limited STM   SENSATION: WFL  COORDINATION: B Heel/toe WFL Heel to shin unable bilaterally   POSTURE:  rounded shoulders, forward head, and flexed trunk   MUSCLE LENGTH: Hamstrings: mod/severe tight B Piriformis: mild /mod tight R>L Hip flexors: mod tight   LOWER EXTREMITY ROM:    Grossly WFL except limited hip extension and  rotation bilaterally  LOWER EXTREMITY MMT:    MMT Right eval Left eval R 09/06/24 L 09/06/24 R 10/15/24 L 10/15/24  Hip flexion 3+ 3+ 3+ 3+ 4 4  Hip extension 3+ 3+ 3+ 3+ 4 4  Hip  abduction 3- 3 3- 3- 4- 4-  Hip adduction 3+ 3+ 3- 3- 4 4-  Hip internal rotation 4- 3+ 4- 4- 4 4  Hip external rotation 4- 3+ 4- 4- 4 4  Knee flexion 4 4 4- 4 4+ 4+  Knee extension 4 4 4- 4 4+ 4+  Ankle dorsiflexion 4- 4- 4- 4 4+ 4+  Ankle plantarflexion        Ankle inversion        Ankle eversion        (Blank rows = not tested)  BED MOBILITY:  SBA  TRANSFERS: Assistive device utilized: None  Sit to stand: Modified independence and SBA Stand to sit: Modified independence and SBA Chair to chair: SBA Floor: NT  GAIT: Distance walked: clinic distances Assistive device utilized: Single point cane, platform RW/2WW, and None Level of assistance: SBA Gait pattern: step through pattern, decreased arm swing- Right, decreased arm swing- Left, decreased stride length, decreased hip/knee flexion- Right, decreased hip/knee flexion- Left, decreased ankle dorsiflexion- Right, decreased ankle dorsiflexion- Left, shuffling, festinating, trunk flexed, poor foot clearance- Right, and poor foot clearance- Left Comments: gait deviations more pronounced with decreasing AD support  FUNCTIONAL TESTS:  5 times sit to stand: unable w/o UE assist - only able to complete 4 reps in 34.03 sec; 20.18 sec with B UE assist Timed up and go (TUG): Normal - 15.84 sec w/o AD, Manual - 15.19 sec w/o AD, Cognitive - 15.66 sec w/o AD 10 meter walk test: 18.25 sec w/o AD, 16.56 sec with SPC, 14.78 sec with platform RW Gait speed: 1.80 ft/sec w/o AD, 1.98 ft/sec with SPC, 2.22 ft/sec with platform RW Berg balance scale: 41/56, 37-45 indicates significant (>80%) risk for falls (06/26/24) DGI: 12/24, <19/24 indicates high risk for falls (06/26/24)  PATIENT SURVEYS:  ABC scale: 620 / 1600 = 38.8 %, indicating a low level of physical functioning (<69% indicates risk for recurrent falls in PD) 09/06/24 - ABC scale: 09/06/24 - 160 / 1600 = 10.0 %   TODAY'S TREATMENT:    10/15/24  THERAPEUTIC EXERCISE: To improve  strength and endurance.  Demonstration, verbal and tactile cues throughout for technique.  NuStep - L6 x 6 min (LE only) Gait for endurance x 650' with platform [phone], with seated rest break on walker seat after 490' feet  THERAPEUTIC ACTIVITIES: To improve functional performance.  Demonstration, verbal and tactile cues throughout for technique.  LE MMT Retro step + ipsilateral arm raise B x 10 B - single UE support on counter Standing hip ABD with looped RTB at ankles x 10 B - UE support on counter Standing hip extension with looped RTB at ankles x 10 B - UE support on counter Standing hip flexion march with looped RTB at midfeet x 10 - UE support on counter   10/09/24 THERAPEUTIC EXERCISE: To improve strength and endurance.  Demonstration, verbal and tactile cues throughout for technique.  NuStep - L6 x 6 min (LE only)  Powr! Moves Seated: 2/5 each  Power rock  Power step  Power twist  Power up  Standing:  2/5 each  Power rock  Power step  Power twist  Power up  Standing reciprocal arm swings x 10 BUE w/ PT assist Standing  trunk rotations x 10 BUE w/ PT assist Pool noodle step overs S/S X 10 each;  F/B X 2/5 each  THERAPEUTIC ACTIVITIES: To improve functional performance.  Demonstration, verbal and tactile cues throughout for technique. Seated hip ER YTB x 20 BLE   10/04/2024 THERAPEUTIC EXERCISE: To improve strength and endurance.  Demonstration, verbal and tactile cues throughout for technique.  NuStep - L6 x 6 min (LE only) 5xSTS- first trial no hands 42 seconds; 2nd trial with hands- 25.28 seconds ABC scale DGI Dynamic Gait Index  Level Surface Moderate Impairment   Change in Gait Speed Mild Impairment   Gait with Horizontal Head Turns Mild Impairment   Gait with Vertical Head Turns Mild Impairment   Gait and Pivot Turn Normal   Step Over Obstacle Mild Impairment   Step Around Obstacles Normal   Steps Moderate Impairment   Total Score 16         10/02/2024 THERAPEUTIC EXERCISE: To improve strength and endurance.  Demonstration, verbal and tactile cues throughout for technique.  NuStep - L5 x 6 min (LE only)  THERAPEUTIC ACTIVITIES: To improve functional performance.  Demonstration, verbal and tactile cues throughout for technique. Standing at wall ladder for support: Alt hip extension 2 x 10 Alt hip ABD 2 x 10 Alt hip flexion march 2 x 10 Alt knee flexion/HS curl 2 x 10 Heel-toe raises 2 x 10 Mini-squat 2 x 10 Step stance lunge 4 x 20 BLE Step ups BLE 4 x 10  Step over yard stick and back forward x 10B; lateral x 10 B   09/27/2024 THERAPEUTIC EXERCISE: To improve strength and endurance.  Demonstration, verbal and tactile cues throughout for technique.  NuStep - L6 x 6 min (LE only) Seated alt LAQ 2 x 10  THERAPEUTIC ACTIVITIES: To improve functional performance.  Demonstration, verbal and tactile cues throughout for technique. Standing at wall ladder for support: Alt hip extension 2 x 10 Alt hip ABD 2 x 10 Alt hip flexion march 2 x 10 Alt knee flexion/HS curl 2 x 10 Heel-toe raises 2 x 10 Mini-squat 2 x 10 = 17.07 sec with platform 4WW Gait speed = 1.92 ft/sec with platform 4WW TUG = 26.93 sec with platform 4WW Alt fwd step-over and back using yardstick as target to increase step length and foot clearance - single UE support on counter for balance  GAIT TRAINING: To normalize gait pattern and improve safety with platform 4WW.  400' with platform [phone] - cues for increased stride length and increased hip and knee flexion to reduce shuffling with gait and reduce tripping hazard   09/25/2024  THERAPEUTIC EXERCISE: To improve strength and endurance.  Demonstration, verbal and tactile cues throughout for technique.  NuStep - L5 x 6 min (started with B UE/LE, but shifted to LE only d/t irritating UE pain) Seated GTB B hip ABD/ER clam 2 x 10 Seated GTB alt hip flexion march 2 x 10  THERAPEUTIC ACTIVITIES: To  improve functional performance.  Demonstration, verbal and tactile cues throughout for technique. Orthostatic BP assessment:  Time BP HR Symptoms  Lying down 5 min 132/70 84 No dizziness  Sitting 1-2 min 112/60 83 Spinning dizziness  Standing 1-2 min 104/56 91 Spinning dizziness  Sit to stand w/o UE assist from slightly elevated treatment table focusing on increased fwd weight shift to reduce posterior LOB.  SELF CARE: Provided education to reduce fall risk.  Provided education on orthostatic/postural hypotension including symptoms, causes and strategies to minimize symptoms and  reduce risk for falls: Need for adequate hydration  Slow transitions with pause to ensure no dizziness or that dizziness has resolved Gentle LE exercises before initiation of movement   09/19/24 THERAPEUTIC EXERCISE: To improve strength and endurance.  Demonstration, verbal and tactile cues throughout for technique. NuStep - L5 x 6 min (LE only) Seated LAQ BLE  10 Seated marching BLE x 10  NEUROMUSCULAR RE-EDUCATION:  Berg Balance Test  Sit to Stand Able to stand without using hands and stabilize independently   Standing Unsupported Able to stand safely 2 minutes   Sitting with Back Unsupported but Feet Supported on Floor or Stool Able to sit safely and securely 2 minutes   Stand to Sit Sits safely with minimal use of hands   Transfers Able to transfer safely, minor use of hands   Standing Unsupported with Eyes Closed Able to stand 10 seconds safely   Standing Unsupported with Feet Together Able to place feet together independently and stand 1 minute safely   From Standing, Reach Forward with Outstretched Arm Can reach confidently >25 cm (10)   From Standing Position, Pick up Object from Floor Able to pick up shoe safely and easily   From Standing Position, Turn to Look Behind Over each Shoulder Looks behind from both sides and weight shifts well   Turn 360 Degrees Able to turn 360 degrees safely but  slowly   Standing Unsupported, Alternately Place Feet on Step/Stool Needs assistance to keep from falling or unable to try   Standing Unsupported, One Foot in Front Needs help to step but can hold 15 seconds   Standing on One Leg Unable to try or needs assist to prevent fall   Total Score 43   Interpretation: 37-45 indicates significant (>80%) risk for falls (06/26/24)      09/18/24 THERAPEUTIC EXERCISE: To improve strength and endurance.  Demonstration, verbal and tactile cues throughout for technique. NuStep - L5 x 6 min (LE only) NEUROMUSCULAR RE-EDUCATION:  Seated on dynadisk hands on table for support: Sitting balance LAQ x 5 BLE- very difficult Perturbations 4 ways Marching x 5 BLE  Gait training:  270 with roller walker - some R foot drag, very short steps B  Sit to stands with chair in front very challenging 3x lots of effort standing even with support  09/06/2024 - Recertification THERAPEUTIC ACTIVITIES: To improve functional performance.  Demonstration, verbal and tactile cues throughout for technique. LE MMT 5xSTS = 32.06 sec with B UE assist TUG = 40.50 sec with platform 4WW ABC Scale: 09/06/24 - 160 / 1600 = 10.0 % = 28.63 sec with platform 4WW Gait speed = 1.15 ft/sec with platform 4WW Goal assessment   PATIENT EDUCATION:  Education details: HEP review, HEP progression - advanced seated march to GTB, and symptoms and management of orthostatic/postural hypotension  Person educated: Patient Education method: Explanation, Demonstration, Verbal cues, Handouts, and MedBridge[employer] app updated Education comprehension: verbalized understanding, returned demonstration, verbal cues required, and needs further education  HOME EXERCISE PROGRAM: *Pt has MedBridge[employer] app access.   Access Code: [email or web address] URL: [email or web address] Date: 10/15/2024 Prepared by: Elijah Hidden  Exercises - Seated Hip Abduction with Resistance  - 1 x daily - 3 x weekly - 3  sets - 10 reps - Seated Hip Adduction Isometrics with Ball  - 1 x daily - 3 x weekly - 3 sets - 10 reps - Seated March with Resistance  - 1 x daily - 3 x weekly - 3 sets - 10 reps -  Sit to Stand  - 1 x daily - 3 x weekly - 1 sets - 5 reps - Seated Hamstring Stretch  - 1-2 x daily - 7 x weekly - 3 reps - 30 sec hold - Seated Figure 4 Piriformis Stretch  - 1-2 x daily - 7 x weekly - 3 reps - 30 sec hold - Seated Hip Flexor Stretch  - 1-2 x daily - 7 x weekly - 3 reps - 30 sec hold - Standing Hip Abduction with Counter Support  - 1 x daily - 7 x weekly - 2 sets - 10 reps - Standing Hip Extension with Counter Support  - 1 x daily - 7 x weekly - 2 sets - 10 reps - Standing March with Counter Support  - 1 x daily - 7 x weekly - 2 sets - 10 reps - Heel Raises with Counter Support  - 1 x daily - 7 x weekly - 2 sets - 10 reps - Retro Step  - 1 x daily - 7 x weekly - 2 sets - 10 reps - 3 sec hold  Patient Education - Check for Safety - Orthostatic Hypotension - Postural Hypotension  PWR Moves: - Sitting - Standing (modified)   ASSESSMENT:  CLINICAL IMPRESSION: Annie feels like he is currently doing better than when he first started PT, especially since he had the episode mid-POC where he experience a significant decline.  His most recent insurance reauthorization only allows for 4 visits (today is #2 of 4), therefore discussed potential readiness to transition to HEP at end of current POC/insurance authorization.  Brynden feels that he would be ready to try this, therefore initiated HEP review providing guidance on progression of exercises and recommended frequency for ongoing performance of HEP.  He notes one of his greatest difficulties is with stepping backwards, therefore added retrostep exercise to HEP utilizing single UE support on solid surface such as counter.  Initiated goal assessment and will continue to incorporate further STG/LTG assessment and remaining 2 visits while continuing to review  and update HEP accordingly in preparation for transition to HEP.     OBJECTIVE IMPAIRMENTS: Abnormal gait, decreased activity tolerance, decreased balance, decreased coordination, decreased endurance, decreased knowledge of condition, decreased knowledge of use of DME, decreased mobility, difficulty walking, decreased strength, decreased safety awareness, dizziness, impaired perceived functional ability, impaired flexibility, improper body mechanics, postural dysfunction, and pain.   ACTIVITY LIMITATIONS: carrying, lifting, bending, standing, squatting, stairs, transfers, bed mobility, reach over head, locomotion level, and caring for others  PARTICIPATION LIMITATIONS: meal prep, cleaning, laundry, shopping, and community activity  PERSONAL FACTORS: Age, Fitness, Past/current experiences, Time since onset of injury/illness/exacerbation, and 3+ comorbidities: Anxiety, asthma, bipolar affective disorder, COPD, depression, DM-II, DJD, chronic knee pain, B TKA, GERD, HTN, Mnire's disease, obesity, pancreatitis, h/o CVA, neurologic dysphagia, tardive dyskinesia, HOH - L deaf & R hearing aid are also affecting patient's functional outcome.   REHAB POTENTIAL: Good  CLINICAL DECISION MAKING: Unstable/unpredictable  EVALUATION COMPLEXITY: High   GOALS: Goals reviewed with patient? Yes  SHORT TERM GOALS: Target date: 07/30/2024, extended to 10/04/2024  Patient will be independent with initial HEP. Baseline: To be established 07/05/24 - consistent with current HEP  07/12/24 - clarified HEP frequency Goal status: MET - 08/06/24   2.  Patient will demonstrate decreased fall risk by scoring < 13.5 sec on normal TUG. Baseline: 15.84 sec w/o AD 08/06/24 - 19.88 sec with platform 4WW (pt misjudged seat and attempted to sit on armrest of  chair), 23.06 sec w/o AD 08/13/24 - 21.01 sec with platform 4WW 09/05/24 - 40.50 sec with platform 4WW Goal status: IN PROGRESS - 09/27/24 - 26.93 sec with platform  4WW  3.  Patient will be educated on strategies to decrease risk of falls.  Baseline:  Goal status: MET - 07/09/24   LONG TERM GOALS: Target date: 09/10/2024, extended to 11/01/2024  Patient will be independent with ongoing/advanced HEP for self-management at home incorporating PWR! Moves as indicated .  Baseline:  08/13/24 - met for current HEP  09/25/24 - HEP reviewed and updated Goal status: IN PROGRESS - 10/15/24 - HEP reviewed and updated  2.  Patient will be able to ambulate 600' with LRAD with good safety to access community.  Baseline:  08/09/24 - 270' with standing 4WW Goal status: MET - 10/15/24 - 650' with platform 4WW, seated rest break required on walker seat after 490'  3.  Patient will be able to step up/down curb safely with LRAD for safety with community ambulation.  Baseline:  Goal status: DEFERRED - 08/16/24 - pt reports he does not attempt curbs with platform 4WW as walker too heavy to navigate up curb   4.  Patient will demonstrate gait speed of >/= 2.62 ft/sec (0.8 m/s) to be a safe limited community ambulator with decreased risk for recurrent falls.  Baseline: 1.80 ft/sec w/o AD, 1.98 ft/sec with SPC and 2.22 ft/sec with platform RW 07/19/24 - 2.26 ft/sec 08/16/24 - 2.16 ft/sec (normal speed), 2.37 ft/sec (fastest comfortable speed) with upright/platform 4WW 09/06/24 - 1.15 ft/sec with platform 4WW Goal status: IN PROGRESS - 09/27/24 - 1.92 ft/sec with platform 4WW  5.  Patient will improve 5x STS time to </= 15 seconds w/o need for UE assist to demonstrate improved functional strength and transfer efficiency. Baseline: unable w/o UE assist - only able to complete 4 reps in 34.03 sec; 20.18 sec with B UE assist 08/06/24 - 20.50 sec with B UE assist Goal status: IN PROGRESS - 10/04/24 - no hands 42 seconds; 2nd trial with hands 25.28 seconds  6.  Patient will demonstrate at least 19/24 on DGI to improve gait stability and reduce risk for falls. Baseline: 12/24  (06/26/24) 08/13/24 - 16/24 Goal status: IN PROGRESS - 09/19/24 16/24  7.  Patient will improve Berg score by at least 8 points to improve safety and stability with ADLs in standing and reduce risk for falls. (MCID = 8 points)   Baseline: 41/56 (06/26/24) 08/13/24 - 47/56 Goal status: IN PROGRESS - 09/19/24 - 43/56  8.  Patient will report >/= 52% on ABC scale to demonstrate improved balance confidence and decreased risk for falls. Baseline: 620 / 1600 = 38.8 % 08/13/24 - 810 / 1600 = 50.6 % Goal status: IN PROGRESS - 09/06/24 - 160 / 1600 = 10.0 %  9. Patient will verbalize understanding of local Parkinson's disease community resources, including community fitness post d/c. Baseline:  08/16/24 - handouts provided Goal status: MET - 10/15/24 - reviewed resources for community programs but declined interest in pursuing any programs currently    PLAN:  PT FREQUENCY: 2x/week  PT DURATION: 12 weeks  PLANNED INTERVENTIONS: [id number]- PT Re-evaluation, 97750- Physical Performance Testing, 97110-Therapeutic exercises, 97530- Therapeutic activity, 97112- Neuromuscular re-education, 97535- Self Care, [id number]- Manual therapy, [id number]- Gait training, [phone]- Canalith repositioning, [id number]- Electrical stimulation (unattended), [id number]- Ultrasound, [id number]- Ionotophoresis 4mg /ml Dexamethasone , 79439 (1-2 muscles), 20561 (3+ muscles)- Dry Needling, Patient/Family education, Balance training, Stair training, Taping, Joint mobilization, Vestibular training, DME  instructions, Cryotherapy, and Moist heat  PLAN FOR NEXT SESSION: Continue STG/LTG assessment - ABC scale, Berg, DGI, 5xSTS, TUG and 10MWT/gait speed; HEP review including progression of LE strengthening, balance activities (both static and dynamic), and PWR! Moves as indicated   Elijah CHRISTELLA Hidden, PT  10/15/2024, 6:59 PM       Date of referral: 05/31/24 Referring provider: Amon Aloysius BRAVO, MD Referring diagnosis?  G21.11,T43.505A (ICD-10-CM) - Neuroleptic-induced  parkinsonism (HCC)  R26.9 (ICD-10-CM) - Gait disorder  R29.6 (ICD-10-CM) - Multiple falls   Treatment diagnosis? (if different than referring diagnosis)  Muscle weakness (generalized)  Unsteadiness on feet  Other abnormalities of gait and mobility  History of falling  What was this (referring dx) caused by? Ongoing Issue and Other: neurodegenerative disease  Nature of Condition: Chronic (continuous duration > 3 months)   Laterality: Both  Current Functional Measure Score: Other ABC Scale:640 / 1600 = 40.0 %    Objective measurements identify impairments when they are compared to normal values, the uninvolved extremity, and prior level of function.  [x]  Yes  []  No  Objective assessment of functional ability: Moderate functional limitations   Briefly describe symptoms:  Tallie is progressing with PT demonstrating improving tolerance for standing activities, improving gait safety and speed with his platform 4WW, and decreasing fall risk per improvements in Berg from 41/56 to 47/56 (indicating a reduction in fall risk from significant (greater than 80%) to moderate moderate (>50%) and DGI from 12/24 to 16/24 (scores of <19/24 still indicating high risk for falls) today.  He has not had any falls in the past month.  Maliq will benefit from continued skilled PT to address ongoing motor control, strength and balance deficits to improve mobility and activity tolerance with decreased pain interference and decreased risk for falls.   How did symptoms start: gradual onset  Average pain intensity:  Last 24 hours: 3-4/10  Past week: 3-4/10  How often does the pt experience symptoms? Constantly  How much have the symptoms interfered with usual daily activities? Quite a bit  How has condition changed since care began at this facility? Better   In general, how is the patients overall health? Fair  Onset date: Parkinsonism diagnosis in 2013, worsening weakness over the past 6 months     BACK PAIN (STarT Back Screening Tool) - (When applicable):   Has your back pain spread down your leg(s) at sometime in the last 2 weeks? []  Yes   [x]  No Have you had pain in the shoulder or neck at sometime in the past 2 weeks? [x]  Yes   []  No Have you only walked short distances because of your back pain? []  Yes   [x]  No In the past 2 weeks, have you dressed more slowly than usual because of your back pain? []  Yes   [x]  No Do you think it is not really safe for person with a condition like yours to be physically active? []  Yes   [x]  No Have worrying thoughts been going through your mind a lot of the time? [x]  Yes   []  No Do you feel that your back pain is terrible and it is never going to get any better? []  Yes   [x]  No In general, have you stopped enjoying all the things you usually enjoy? []  Yes   [x]  No Overall, how bothersome has your back pain been in the last 2 weeks? []  Not at all   [x]  Slightly     []  Moderate   []   Very much     []  Extremely

## 2024-10-18 ENCOUNTER — Ambulatory Visit

## 2024-10-18 DIAGNOSIS — R2681 Unsteadiness on feet: Secondary | ICD-10-CM

## 2024-10-18 DIAGNOSIS — M6281 Muscle weakness (generalized): Secondary | ICD-10-CM | POA: Diagnosis not present

## 2024-10-18 DIAGNOSIS — Z9181 History of falling: Secondary | ICD-10-CM

## 2024-10-18 DIAGNOSIS — R2689 Other abnormalities of gait and mobility: Secondary | ICD-10-CM

## 2024-10-18 NOTE — Therapy (Addendum)
 OUTPATIENT PHYSICAL THERAPY TREATMENT     Patient Name: Ryan Zhang MRN: [id number] DOB:[birth year], 78 y.o., male Today's Date: 10/18/2024   END OF SESSION:  PT End of Session - 10/18/24 1447     Visit Number 21    Date for Recertification  11/01/24    Authorization Type [employer]    Authorization Time Period 10/08/24 - 11/05/24    Authorization - Visit Number 3    Authorization - Number of Visits 4    Progress Note Due on Visit 22   Recert/PN on visit #12 (09/06/24)   PT Start Time 1445    PT Stop Time 1531    PT Time Calculation (min) 46 min    Activity Tolerance Patient limited by fatigue    Behavior During Therapy WFL for tasks assessed/performed                     Past Medical History:  Diagnosis Date   Allergy    allergy shots Dr. Cloretta   Anemia    Anxiety    Asthma    moderate persistant   Bipolar affective (HCC)    Cataract    both eyes   Clotting disorder    Due to blood thinner   COPD (chronic obstructive pulmonary disease) (HCC)    Depression    Diabetes mellitus    DJD (degenerative joint disease)    Dysphagia    Gallstones    hx of, s/p cholecystectomy   GERD (gastroesophageal reflux disease)    Hepatitis A    as teenager 60's   Hollenhorst plaque    right eye   Hyperlipidemia    Hypertension    Kidney stones    hx of   Meniere's disease    Motility disorder, esophageal    Obesity    Pancreatitis    Personal history of colonic polyps 11/2004   hyperplastic.   Stroke [facility])    per MRI   Past Surgical History:  Procedure Laterality Date   CATARACT EXTRACTION Bilateral 09/2015 and 10/2015   CHOLECYSTECTOMY     COLONOSCOPY     EYE SURGERY     JOINT REPLACEMENT     TOTAL KNEE ARTHROPLASTY Bilateral    both knees   Patient Active Problem List   Diagnosis Date Noted   Hypomagnesemia 09/08/2024   Tinea cruris 07/24/2020   PCP NOTES >>>>>>>>>>>>>>>>>>>>>>>>>>>>>>> 01/12/2016   Folliculitis 05/09/2015   Hearing  loss in right ear 08/05/2014   Panic attack as reaction to stress 04/20/2012   Dysphagia, neurologic 01/07/2012   Morbid obesity (HCC) 01/07/2012   Chest pain, musculoskeletal 12/07/2011   Candidiasis of skin 08/12/2011   Annual physical exam 06/07/2011   OLECRANON BURSITIS 01/29/2011   PARTIAL ARTERIAL OCCLUSION OF RETINA 05/27/2010   Essential hypertension, benign 01/16/2010   ABNORMAL ELECTROCARDIOGRAM 01/14/2010   Asthma 10/14/2009   GERD 10/14/2009   Bipolar disorder (HCC) 02/20/2009   PHIMOSIS 02/20/2009   KNEE PAIN, CHRONIC 11/20/2008   HIP PAIN, RIGHT 08/19/2008   DM type 2 with diabetic peripheral neuropathy (HCC) 02/22/2007   Hyperlipidemia associated with type 2 diabetes mellitus (HCC) 02/22/2007   ALLERGIC RHINITIS, SEASONAL 02/22/2007   LOW BACK PAIN, CHRONIC 02/22/2007   MENIERE'S DISEASE, HX OF 02/22/2007    PCP: Amon Aloysius BRAVO, MD   REFERRING PROVIDER: Amon Aloysius BRAVO, MD  (Neurologist - Evonnie Stabs, MD)  REFERRING DIAG:  [phone] (ICD-10-CM) - Neuroleptic-induced parkinsonism (HCC)  R26.9 (ICD-10-CM) - Gait disorder  R29.6 (ICD-10-CM) - Multiple falls   THERAPY DIAG:  Muscle weakness (generalized)  Unsteadiness on feet  Other abnormalities of gait and mobility  History of falling  RATIONALE FOR EVALUATION AND TREATMENT: Rehabilitation  ONSET DATE: ~2013 - 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 He was able to get himself up though.  PAIN: Are you having pain? Yes: NPRS scale: 4/10 Pain location: L shoulder  PERTINENT HISTORY:  Anxiety, asthma, bipolar  affective disorder, COPD, depression, DM-II, DJD, chronic knee pain, B TKA, GERD, HTN, Mnire's disease, obesity, pancreatitis, h/o CVA, neurologic dysphagia, tardive dyskinesia, HOH - L deaf & R hearing aid  PRECAUTIONS: Fall  RED FLAGS: None  WEIGHT BEARING RESTRICTIONS: No  FALLS:  Has patient fallen in last 6 months? No and before the levodopa  supplements, falls were daily (sometimes several times a day)  LIVING ENVIRONMENT: Lives with: lives with their spouse and dog Lives in: Apartment Stairs: No Has following equipment at home: Single point cane, Environmental Consultant - 2 wheeled, shower chair, and Grab bars (platform RW)  OCCUPATION: Retired  PLOF: Independent, Independent with community mobility with device, and Leisure: watching TV, listening to books, programming, walking ~15 min (around the block) - tries to do daily   PATIENT GOALS: To get back to the point where I can walk to the car unassisted (no AD) and use platform RW less in community.  Be able to assist my wife with bringing groceries in from the car.   OBJECTIVE: (objective measures completed at initial evaluation unless otherwise dated)  DIAGNOSTIC FINDINGS:  N/A - Multiple imaging studies in 2024 s/p falls but no acute fractures.  COGNITION: Overall cognitive status: Impaired and History of cognitive impairments - at baseline - mostly memory issues resulting from ECT 19 yrs ago, better over last 3 months but still notes limited STM   SENSATION: WFL  COORDINATION: B Heel/toe WFL Heel to shin unable bilaterally   POSTURE:  rounded shoulders, forward head, and flexed trunk   MUSCLE LENGTH: Hamstrings: mod/severe tight B Piriformis: mild /mod tight R>L Hip flexors: mod tight   LOWER EXTREMITY ROM:    Grossly WFL except limited hip  extension and rotation bilaterally  LOWER EXTREMITY MMT:    MMT Right eval Left eval R 09/06/24 L 09/06/24 R 10/15/24 L 10/15/24  Hip flexion 3+ 3+ 3+ 3+ 4 4  Hip extension 3+ 3+  3+ 3+ 4 4  Hip abduction 3- 3 3- 3- 4- 4-  Hip adduction 3+ 3+ 3- 3- 4 4-  Hip internal rotation 4- 3+ 4- 4- 4 4  Hip external rotation 4- 3+ 4- 4- 4 4  Knee flexion 4 4 4- 4 4+ 4+  Knee extension 4 4 4- 4 4+ 4+  Ankle dorsiflexion 4- 4- 4- 4 4+ 4+  Ankle plantarflexion        Ankle inversion        Ankle eversion        (Blank rows = not tested)  BED MOBILITY:  SBA  TRANSFERS: Assistive device utilized: None  Sit to stand: Modified independence and SBA Stand to sit: Modified independence and SBA Chair to chair: SBA Floor: NT  GAIT: Distance walked: clinic distances Assistive device utilized: Single point cane, platform RW/2WW, and None Level of assistance: SBA Gait pattern: step through pattern, decreased arm swing- Right, decreased arm swing- Left, decreased stride length, decreased hip/knee flexion- Right, decreased hip/knee flexion- Left, decreased ankle dorsiflexion- Right, decreased ankle dorsiflexion- Left, shuffling, festinating, trunk flexed, poor foot clearance- Right, and poor foot clearance- Left Comments: gait deviations more pronounced with decreasing AD support  FUNCTIONAL TESTS:  5 times sit to stand: unable w/o UE assist - only able to complete 4 reps in 34.03 sec; 20.18 sec with B UE assist Timed up and go (TUG): Normal - 15.84 sec w/o AD, Manual - 15.19 sec w/o AD, Cognitive - 15.66 sec w/o AD 10 meter walk test: 18.25 sec w/o AD, 16.56 sec with SPC, 14.78 sec with platform RW Gait speed: 1.80 ft/sec w/o AD, 1.98 ft/sec with SPC, 2.22 ft/sec with platform RW Berg balance scale: 41/56, 37-45 indicates significant (>80%) risk for falls (06/26/24) DGI: 12/24, <19/24 indicates high risk for falls (06/26/24)  PATIENT SURVEYS:  ABC scale: 620 / 1600 = 38.8 %, indicating a low level of physical functioning (<69% indicates risk for recurrent falls in PD) 09/06/24 - ABC scale: 09/06/24 - 160 / 1600 = 10.0 %   TODAY'S TREATMENT:  10/18/24  THERAPEUTIC EXERCISE: To  improve strength and endurance.  Demonstration, verbal and tactile cues throughout for technique.  NuStep - L6 x 6 min (LE only) Leg curls 15lb x 10 BLE Leg ext 5lb x 10 BLE  THERAPEUTIC ACTIVITIES: To improve functional performance.  Demonstration, verbal and tactile cues throughout for technique.  Standing hip ABD with 3lb weights 2 x 10 B - UE support  Standing hip extension with 3lb weight 2 x 10 B - UE support Standing toe tap 3lb BLE 2x10 one UE support Retro step + ipsilateral arm raise B x 10 B - single UE support on counter Standing heel/toe raises BLE 2x10  10/15/24  THERAPEUTIC EXERCISE: To improve strength and endurance.  Demonstration, verbal and tactile cues throughout for technique.  NuStep - L6 x 6 min (LE only) Gait for endurance x 650' with platform [phone], with seated rest break on walker seat after 490' feet  THERAPEUTIC ACTIVITIES: To improve functional performance.  Demonstration, verbal and tactile cues throughout for technique.  LE MMT Retro step + ipsilateral arm raise B x 10 B - single UE support on counter Standing hip ABD with looped RTB at ankles x  10 B - UE support on counter Standing hip extension with looped RTB at ankles x 10 B - UE support on counter Standing hip flexion march with looped RTB at midfeet x 10 - UE support on counter   10/09/24 THERAPEUTIC EXERCISE: To improve strength and endurance.  Demonstration, verbal and tactile cues throughout for technique.  NuStep - L6 x 6 min (LE only)  Powr! Moves Seated: 2/5 each  Power rock  Power step  Power twist  Power up  Standing:  2/5 each  Power rock  Power step  Power twist  Power up  Standing reciprocal arm swings x 10 BUE w/ PT assist Standing trunk rotations x 10 BUE w/ PT assist Pool noodle step overs S/S X 10 each;  F/B X 2/5 each  THERAPEUTIC ACTIVITIES: To improve functional performance.  Demonstration, verbal and tactile cues throughout for technique. Seated hip ER YTB x 20  BLE   10/04/2024 THERAPEUTIC EXERCISE: To improve strength and endurance.  Demonstration, verbal and tactile cues throughout for technique.  NuStep - L6 x 6 min (LE only) 5xSTS- first trial no hands 42 seconds; 2nd trial with hands- 25.28 seconds ABC scale DGI Dynamic Gait Index  Level Surface Moderate Impairment   Change in Gait Speed Mild Impairment   Gait with Horizontal Head Turns Mild Impairment   Gait with Vertical Head Turns Mild Impairment   Gait and Pivot Turn Normal   Step Over Obstacle Mild Impairment   Step Around Obstacles Normal   Steps Moderate Impairment   Total Score 16        10/02/2024 THERAPEUTIC EXERCISE: To improve strength and endurance.  Demonstration, verbal and tactile cues throughout for technique.  NuStep - L5 x 6 min (LE only)  THERAPEUTIC ACTIVITIES: To improve functional performance.  Demonstration, verbal and tactile cues throughout for technique. Standing at wall ladder for support: Alt hip extension 2 x 10 Alt hip ABD 2 x 10 Alt hip flexion march 2 x 10 Alt knee flexion/HS curl 2 x 10 Heel-toe raises 2 x 10 Mini-squat 2 x 10 Step stance lunge 4 x 20 BLE Step ups BLE 4 x 10  Step over yard stick and back forward x 10B; lateral x 10 B   09/27/2024 THERAPEUTIC EXERCISE: To improve strength and endurance.  Demonstration, verbal and tactile cues throughout for technique.  NuStep - L6 x 6 min (LE only) Seated alt LAQ 2 x 10  THERAPEUTIC ACTIVITIES: To improve functional performance.  Demonstration, verbal and tactile cues throughout for technique. Standing at wall ladder for support: Alt hip extension 2 x 10 Alt hip ABD 2 x 10 Alt hip flexion march 2 x 10 Alt knee flexion/HS curl 2 x 10 Heel-toe raises 2 x 10 Mini-squat 2 x 10 = 17.07 sec with platform 4WW Gait speed = 1.92 ft/sec with platform 4WW TUG = 26.93 sec with platform 4WW Alt fwd step-over and back using yardstick as target to increase step length and foot clearance  - single UE support on counter for balance  GAIT TRAINING: To normalize gait pattern and improve safety with platform 4WW.  400' with platform [phone] - cues for increased stride length and increased hip and knee flexion to reduce shuffling with gait and reduce tripping hazard   09/25/2024  THERAPEUTIC EXERCISE: To improve strength and endurance.  Demonstration, verbal and tactile cues throughout for technique.  NuStep - L5 x 6 min (started with B UE/LE, but shifted to LE only d/t irritating UE  pain) Seated GTB B hip ABD/ER clam 2 x 10 Seated GTB alt hip flexion march 2 x 10  THERAPEUTIC ACTIVITIES: To improve functional performance.  Demonstration, verbal and tactile cues throughout for technique. Orthostatic BP assessment:  Time BP HR Symptoms  Lying down 5 min 132/70 84 No dizziness  Sitting 1-2 min 112/60 83 Spinning dizziness  Standing 1-2 min 104/56 91 Spinning dizziness  Sit to stand w/o UE assist from slightly elevated treatment table focusing on increased fwd weight shift to reduce posterior LOB.  SELF CARE: Provided education to reduce fall risk.  Provided education on orthostatic/postural hypotension including symptoms, causes and strategies to minimize symptoms and reduce risk for falls: Need for adequate hydration  Slow transitions with pause to ensure no dizziness or that dizziness has resolved Gentle LE exercises before initiation of movement   09/19/24 THERAPEUTIC EXERCISE: To improve strength and endurance.  Demonstration, verbal and tactile cues throughout for technique. NuStep - L5 x 6 min (LE only) Seated LAQ BLE  10 Seated marching BLE x 10  NEUROMUSCULAR RE-EDUCATION:  Berg Balance Test  Sit to Stand Able to stand without using hands and stabilize independently   Standing Unsupported Able to stand safely 2 minutes   Sitting with Back Unsupported but Feet Supported on Floor or Stool Able to sit safely and securely 2 minutes   Stand to Sit Sits safely with  minimal use of hands   Transfers Able to transfer safely, minor use of hands   Standing Unsupported with Eyes Closed Able to stand 10 seconds safely   Standing Unsupported with Feet Together Able to place feet together independently and stand 1 minute safely   From Standing, Reach Forward with Outstretched Arm Can reach confidently >25 cm (10)   From Standing Position, Pick up Object from Floor Able to pick up shoe safely and easily   From Standing Position, Turn to Look Behind Over each Shoulder Looks behind from both sides and weight shifts well   Turn 360 Degrees Able to turn 360 degrees safely but slowly   Standing Unsupported, Alternately Place Feet on Step/Stool Needs assistance to keep from falling or unable to try   Standing Unsupported, One Foot in Front Needs help to step but can hold 15 seconds   Standing on One Leg Unable to try or needs assist to prevent fall   Total Score 43   Interpretation: 37-45 indicates significant (>80%) risk for falls (06/26/24)      09/18/24 THERAPEUTIC EXERCISE: To improve strength and endurance.  Demonstration, verbal and tactile cues throughout for technique. NuStep - L5 x 6 min (LE only) NEUROMUSCULAR RE-EDUCATION:  Seated on dynadisk hands on table for support: Sitting balance LAQ x 5 BLE- very difficult Perturbations 4 ways Marching x 5 BLE  Gait training:  270 with roller walker - some R foot drag, very short steps B  Sit to stands with chair in front very challenging 3x lots of effort standing even with support  09/06/2024 - Recertification THERAPEUTIC ACTIVITIES: To improve functional performance.  Demonstration, verbal and tactile cues throughout for technique. LE MMT 5xSTS = 32.06 sec with B UE assist TUG = 40.50 sec with platform 4WW ABC Scale: 09/06/24 - 160 / 1600 = 10.0 % = 28.63 sec with platform 4WW Gait speed = 1.15 ft/sec with platform 4WW Goal assessment   PATIENT EDUCATION:  Education details: HEP review, HEP  progression - advanced seated march to GTB, and symptoms and management of orthostatic/postural hypotension  Person educated: Patient Education method: Explanation, Demonstration, Verbal cues, Handouts, and MedBridge[employer] app updated Education comprehension: verbalized understanding, returned demonstration, verbal cues required, and needs further education  HOME EXERCISE PROGRAM: *Pt has MedBridge[employer] app access.   Access Code: [email or web address] URL: [email or web address] Date: 10/15/2024 Prepared by: Elijah Hidden  Exercises - Seated Hip Abduction with Resistance  - 1 x daily - 3 x weekly - 3 sets - 10 reps - Seated Hip Adduction Isometrics with Ball  - 1 x daily - 3 x weekly - 3 sets - 10 reps - Seated March with Resistance  - 1 x daily - 3 x weekly - 3 sets - 10 reps - Sit to Stand  - 1 x daily - 3 x weekly - 1 sets - 5 reps - Seated Hamstring Stretch  - 1-2 x daily - 7 x weekly - 3 reps - 30 sec hold - Seated Figure 4 Piriformis Stretch  - 1-2 x daily - 7 x weekly - 3 reps - 30 sec hold - Seated Hip Flexor Stretch  - 1-2 x daily - 7 x weekly - 3 reps - 30 sec hold - Standing Hip Abduction with Counter Support  - 1 x daily - 7 x weekly - 2 sets - 10 reps - Standing Hip Extension with Counter Support  - 1 x daily - 7 x weekly - 2 sets - 10 reps - Standing March with Counter Support  - 1 x daily - 7 x weekly - 2 sets - 10 reps - Heel Raises with Counter Support  - 1 x daily - 7 x weekly - 2 sets - 10 reps - Retro Step  - 1 x daily - 7 x weekly - 2 sets - 10 reps - 3 sec hold  Patient Education - Check for Safety - Orthostatic Hypotension - Postural Hypotension  PWR Moves: - Sitting - Standing (modified)   ASSESSMENT:  CLINICAL IMPRESSION: Continued with functional strengthening in standing positions with intermittent rest breaks as needed. Cues for full ROM during HS curls. ABC assessed today. One more visit left in auth for insurance will plan for reassessment at this time.     OBJECTIVE IMPAIRMENTS: Abnormal gait, decreased activity tolerance, decreased balance, decreased coordination, decreased endurance, decreased knowledge of condition, decreased knowledge of use of DME, decreased mobility, difficulty walking, decreased strength, decreased safety awareness, dizziness, impaired perceived functional ability, impaired flexibility, improper body mechanics, postural dysfunction, and pain.   ACTIVITY LIMITATIONS: carrying, lifting, bending, standing, squatting, stairs, transfers, bed mobility, reach over head, locomotion level, and caring for others  PARTICIPATION LIMITATIONS: meal prep, cleaning, laundry, shopping, and community activity  PERSONAL FACTORS: Age, Fitness, Past/current experiences, Time since onset of injury/illness/exacerbation, and 3+ comorbidities: Anxiety, asthma, bipolar affective disorder, COPD, depression, DM-II, DJD, chronic knee pain, B TKA, GERD, HTN, Mnire's disease, obesity, pancreatitis, h/o CVA, neurologic dysphagia, tardive dyskinesia, HOH - L deaf & R hearing aid are also affecting patient's functional outcome.   REHAB POTENTIAL: Good  CLINICAL DECISION MAKING: Unstable/unpredictable  EVALUATION COMPLEXITY: High   GOALS: Goals reviewed with patient? Yes  SHORT TERM GOALS: Target date: 07/30/2024, extended to 10/04/2024  Patient will be independent with initial HEP. Baseline: To be established 07/05/24 - consistent with current HEP  07/12/24 - clarified HEP frequency Goal status: MET - 08/06/24   2.  Patient will demonstrate decreased fall risk by scoring < 13.5 sec on normal TUG. Baseline: 15.84 sec w/o AD 08/06/24 - 19.88 sec with platform 4WW (pt misjudged  seat and attempted to sit on armrest of chair), 23.06 sec w/o AD 08/13/24 - 21.01 sec with platform 4WW 09/05/24 - 40.50 sec with platform 4WW Goal status: IN PROGRESS - 09/27/24 - 26.93 sec with platform 4WW  3.  Patient will be educated on strategies to decrease risk of  falls.  Baseline:  Goal status: MET - 07/09/24   LONG TERM GOALS: Target date: 09/10/2024, extended to 11/01/2024  Patient will be independent with ongoing/advanced HEP for self-management at home incorporating PWR! Moves as indicated .  Baseline:  08/13/24 - met for current HEP  09/25/24 - HEP reviewed and updated Goal status: IN PROGRESS - 10/15/24 - HEP reviewed and updated  2.  Patient will be able to ambulate 600' with LRAD with good safety to access community.  Baseline:  08/09/24 - 270' with standing 4WW Goal status: MET - 10/15/24 - 650' with platform 4WW, seated rest break required on walker seat after 490'  3.  Patient will be able to step up/down curb safely with LRAD for safety with community ambulation.  Baseline:  Goal status: DEFERRED - 08/16/24 - pt reports he does not attempt curbs with platform 4WW as walker too heavy to navigate up curb   4.  Patient will demonstrate gait speed of >/= 2.62 ft/sec (0.8 m/s) to be a safe limited community ambulator with decreased risk for recurrent falls.  Baseline: 1.80 ft/sec w/o AD, 1.98 ft/sec with SPC and 2.22 ft/sec with platform RW 07/19/24 - 2.26 ft/sec 08/16/24 - 2.16 ft/sec (normal speed), 2.37 ft/sec (fastest comfortable speed) with upright/platform 4WW 09/06/24 - 1.15 ft/sec with platform 4WW Goal status: IN PROGRESS - 09/27/24 - 1.92 ft/sec with platform 4WW  5.  Patient will improve 5x STS time to </= 15 seconds w/o need for UE assist to demonstrate improved functional strength and transfer efficiency. Baseline: unable w/o UE assist - only able to complete 4 reps in 34.03 sec; 20.18 sec with B UE assist 08/06/24 - 20.50 sec with B UE assist Goal status: IN PROGRESS - 10/04/24 - no hands 42 seconds; 2nd trial with hands 25.28 seconds  6.  Patient will demonstrate at least 19/24 on DGI to improve gait stability and reduce risk for falls. Baseline: 12/24 (06/26/24) 08/13/24 - 16/24 Goal status: IN PROGRESS - 09/19/24 16/24;  10/04/24- 16/24  7.  Patient will improve Berg score by at least 8 points to improve safety and stability with ADLs in standing and reduce risk for falls. (MCID = 8 points)   Baseline: 41/56 (06/26/24) 08/13/24 - 47/56 Goal status: IN PROGRESS - 09/19/24 - 43/56  8.  Patient will report >/= 52% on ABC scale to demonstrate improved balance confidence and decreased risk for falls. Baseline: 620 / 1600 = 38.8 % 08/13/24 - 810 / 1600 = 50.6 % Goal status: IN PROGRESS - 09/06/24 - 160 / 1600 = 10.0 %; 10/18/24- 620 / 1600 = 38.8 %  9. Patient will verbalize understanding of local Parkinson's disease community resources, including community fitness post d/c. Baseline:  08/16/24 - handouts provided Goal status: MET - 10/15/24 - reviewed resources for community programs but declined interest in pursuing any programs currently    PLAN:  PT FREQUENCY: 2x/week  PT DURATION: 12 weeks  PLANNED INTERVENTIONS: [id number]- PT Re-evaluation, 97750- Physical Performance Testing, 97110-Therapeutic exercises, 97530- Therapeutic activity, [id number]- Neuromuscular re-education, 97535- Self Care, [id number]- Manual therapy, [id number]- Gait training, [phone]- Canalith repositioning, [id number]- Electrical stimulation (unattended), [id number]- Ultrasound, 02966- Ionotophoresis 4mg /ml Dexamethasone , 79439 (1-2 muscles),  79438 (3+ muscles)- Dry Needling, Patient/Family education, Balance training, Stair training, Taping, Joint mobilization, Vestibular training, DME instructions, Cryotherapy, and Moist heat  PLAN FOR NEXT SESSION: Continue STG/LTG assessment - Berg, DGI, 5xSTS, TUG and 10MWT/gait speed; HEP review including progression of LE strengthening, balance activities (both static and dynamic), and PWR! Moves as indicated   Sol LITTIE Gaskins, PTA  10/18/2024, 3:34 PM       Date of referral: 05/31/24 Referring provider: Amon Aloysius BRAVO, MD Referring diagnosis?  G21.11,T43.505A (ICD-10-CM) - Neuroleptic-induced parkinsonism (HCC)  R26.9  (ICD-10-CM) - Gait disorder  R29.6 (ICD-10-CM) - Multiple falls   Treatment diagnosis? (if different than referring diagnosis)  Muscle weakness (generalized)  Unsteadiness on feet  Other abnormalities of gait and mobility  History of falling  What was this (referring dx) caused by? Ongoing Issue and Other: neurodegenerative disease  Nature of Condition: Chronic (continuous duration > 3 months)   Laterality: Both  Current Functional Measure Score: Other ABC Scale:640 / 1600 = 40.0 %    Objective measurements identify impairments when they are compared to normal values, the uninvolved extremity, and prior level of function.  [x]  Yes  []  No  Objective assessment of functional ability: Moderate functional limitations   Briefly describe symptoms:  Mykeal is progressing with PT demonstrating improving tolerance for standing activities, improving gait safety and speed with his platform 4WW, and decreasing fall risk per improvements in Berg from 41/56 to 47/56 (indicating a reduction in fall risk from significant (greater than 80%) to moderate moderate (>50%) and DGI from 12/24 to 16/24 (scores of <19/24 still indicating high risk for falls) today.  He has not had any falls in the past month.  [place] will benefit from continued skilled PT to address ongoing motor control, strength and balance deficits to improve mobility and activity tolerance with decreased pain interference and decreased risk for falls.   How did symptoms start: gradual onset  Average pain intensity:  Last 24 hours: 3-4/10  Past week: 3-4/10  How often does the pt experience symptoms? Constantly  How much have the symptoms interfered with usual daily activities? Quite a bit  How has condition changed since care began at this facility? Better   In general, how is the patients overall health? Fair  Onset date: Parkinsonism diagnosis in 2013, worsening weakness over the past 6 months    BACK PAIN (STarT Back  Screening Tool) - (When applicable):   Has your back pain spread down your leg(s) at sometime in the last 2 weeks? []  Yes   [x]  No Have you had pain in the shoulder or neck at sometime in the past 2 weeks? [x]  Yes   []  No Have you only walked short distances because of your back pain? []  Yes   [x]  No In the past 2 weeks, have you dressed more slowly than usual because of your back pain? []  Yes   [x]  No Do you think it is not really safe for person with a condition like yours to be physically active? []  Yes   [x]  No Have worrying thoughts been going through your mind a lot of the time? [x]  Yes   []  No Do you feel that your back pain is terrible and it is never going to get any better? []  Yes   [x]  No In general, have you stopped enjoying all the things you usually enjoy? []  Yes   [x]  No Overall, how bothersome has your back pain been in the last 2 weeks? []   Not at all   [x]  Slightly     []  Moderate   []  Very much     []  Extremely

## 2024-10-22 ENCOUNTER — Ambulatory Visit: Admitting: Physical Therapy

## 2024-10-22 ENCOUNTER — Encounter: Payer: Self-pay | Admitting: Physical Therapy

## 2024-10-22 DIAGNOSIS — R2689 Other abnormalities of gait and mobility: Secondary | ICD-10-CM

## 2024-10-22 DIAGNOSIS — R2681 Unsteadiness on feet: Secondary | ICD-10-CM

## 2024-10-22 DIAGNOSIS — M6281 Muscle weakness (generalized): Secondary | ICD-10-CM

## 2024-10-22 DIAGNOSIS — Z9181 History of falling: Secondary | ICD-10-CM

## 2024-10-22 NOTE — Therapy (Signed)
 OUTPATIENT PHYSICAL THERAPY TREATMENT / DISCHARGE SUMMARY   Progress Note  Reporting Period 09/06/2024 to 10/22/2024   See note below for Objective Data and Assessment of Progress/Goals.     Patient Name: Ryan Zhang MRN: [id number] DOB:June 24, 1946, 78 y.o., male Today's Date: 10/22/2024   END OF SESSION:  PT End of Session - 10/22/24 1444     Visit Number 22    Date for Recertification  11/01/24    Authorization Type [employer]    Authorization Time Period 10/08/24 - 11/05/24    Authorization - Visit Number 4    Authorization - Number of Visits 4    Progress Note Due on Visit --    PT Start Time 1444    PT Stop Time 1537    PT Time Calculation (min) 53 min    Activity Tolerance Patient limited by fatigue;Patient tolerated treatment well    Behavior During Therapy WFL for tasks assessed/performed              Past Medical History:  Diagnosis Date   Allergy    allergy shots Dr. Cloretta   Anemia    Anxiety    Asthma    moderate persistant   Bipolar affective (HCC)    Cataract    both eyes   Clotting disorder    Due to blood thinner   COPD (chronic obstructive pulmonary disease) (HCC)    Depression    Diabetes mellitus    DJD (degenerative joint disease)    Dysphagia    Gallstones    hx of, s/p cholecystectomy   GERD (gastroesophageal reflux disease)    Hepatitis A    as teenager 60's   Hollenhorst plaque    right eye   Hyperlipidemia    Hypertension    Kidney stones    hx of   Meniere's disease    Motility disorder, esophageal    Obesity    Pancreatitis    Personal history of colonic polyps 11/2004   hyperplastic.   Stroke [facility])    per MRI   Past Surgical History:  Procedure Laterality Date   CATARACT EXTRACTION Bilateral 09/2015 and 10/2015   CHOLECYSTECTOMY     COLONOSCOPY     EYE SURGERY     JOINT REPLACEMENT     TOTAL KNEE ARTHROPLASTY Bilateral    both knees   Patient Active Problem List   Diagnosis Date Noted    Hypomagnesemia 09/08/2024   Tinea cruris 07/24/2020   PCP NOTES >>>>>>>>>>>>>>>>>>>>>>>>>>>>>>> 01/12/2016   Folliculitis 05/09/2015   Hearing loss in right ear 08/05/2014   Panic attack as reaction to stress 04/20/2012   Dysphagia, neurologic 01/07/2012   Morbid obesity (HCC) 01/07/2012   Chest pain, musculoskeletal 12/07/2011   Candidiasis of skin 08/12/2011   Annual physical exam 06/07/2011   OLECRANON BURSITIS 01/29/2011   PARTIAL ARTERIAL OCCLUSION OF RETINA 05/27/2010   Essential hypertension, benign 01/16/2010   ABNORMAL ELECTROCARDIOGRAM 01/14/2010   Asthma 10/14/2009   GERD 10/14/2009   Bipolar disorder (HCC) 02/20/2009   PHIMOSIS 02/20/2009   KNEE PAIN, CHRONIC 11/20/2008   HIP PAIN, RIGHT 08/19/2008   DM type 2 with diabetic peripheral neuropathy (HCC) 02/22/2007   Hyperlipidemia associated with type 2 diabetes mellitus (HCC) 02/22/2007   ALLERGIC RHINITIS, SEASONAL 02/22/2007   LOW BACK PAIN, CHRONIC 02/22/2007   MENIERE'S DISEASE, HX OF 02/22/2007    PCP: Amon Aloysius BRAVO, MD   REFERRING PROVIDER: Amon Aloysius BRAVO, MD  (Neurologist - Evonnie Stabs, MD)  REFERRING  DIAG:  H78.88,U56.494J (ICD-10-CM) - Neuroleptic-induced parkinsonism (HCC)  R26.9 (ICD-10-CM) - Gait disorder  R29.6 (ICD-10-CM) - Multiple falls   THERAPY DIAG:  Muscle weakness (generalized)  Unsteadiness on feet  Other abnormalities of gait and mobility  History of falling  RATIONALE FOR EVALUATION AND TREATMENT: Rehabilitation  ONSET DATE: ~2013 - secondary parkinsonism secondary to Saphris ; worsening weakness over past 6 months  NEXT MD VISIT: 01/18/25   SUBJECTIVE:                                                                                                                                                                                                         SUBJECTIVE STATEMENT:  Pt reports he can walk around the apartment and to/from the car easier.  Sometimes goes w/o the AD in his house  and was able to use just a cane to go get his hair cut.  PAIN: Are you having pain? Yes: NPRS scale: 3-4/10 Pain location: L shoulder & B elbows  PERTINENT HISTORY:  Anxiety, asthma, bipolar affective disorder, COPD, depression, DM-II, DJD, chronic knee pain, B TKA, GERD, HTN, Mnire's disease, obesity, pancreatitis, h/o CVA, neurologic dysphagia, tardive dyskinesia, HOH - L deaf & R hearing aid  PRECAUTIONS: Fall  RED FLAGS: None  WEIGHT BEARING RESTRICTIONS: No  FALLS:  Has patient fallen in last 6 months? No and before the levodopa  supplements, falls were daily (sometimes several times a day)  LIVING ENVIRONMENT: Lives with: lives with their spouse and dog Lives in: Apartment Stairs: No Has following equipment at home: Single point cane, Environmental Consultant - 2 wheeled, shower chair, and Grab bars (platform RW)  OCCUPATION: Retired  PLOF: Independent, Independent with community mobility with device, and Leisure: watching TV, listening to books, programming, walking ~15 min (around the block) - tries to do daily   PATIENT GOALS: To get back to the point where I can walk to the car unassisted (no AD) and use platform RW less in community.  Be able to assist my wife with bringing groceries in from the car.   OBJECTIVE: (objective measures completed at initial evaluation unless otherwise dated)  DIAGNOSTIC FINDINGS:  N/A - Multiple imaging studies in 2024 s/p falls but no acute fractures.  COGNITION: Overall cognitive status: Impaired and History of cognitive impairments - at baseline - mostly memory issues resulting from ECT 19 yrs ago, better over last 3 months but still notes limited STM   SENSATION: WFL  COORDINATION: B Heel/toe WFL Heel to shin unable bilaterally   POSTURE:  rounded shoulders, forward head, and flexed trunk  MUSCLE LENGTH: Hamstrings: mod/severe tight B Piriformis: mild /mod tight R>L Hip flexors: mod tight   LOWER EXTREMITY ROM:    Grossly WFL  except limited hip extension and rotation bilaterally  LOWER EXTREMITY MMT:    MMT Right eval Left eval R 09/06/24 L 09/06/24 R 10/15/24 L 10/15/24  Hip flexion 3+ 3+ 3+ 3+ 4 4  Hip extension 3+ 3+ 3+ 3+ 4 4  Hip abduction 3- 3 3- 3- 4- 4-  Hip adduction 3+ 3+ 3- 3- 4 4-  Hip internal rotation 4- 3+ 4- 4- 4 4  Hip external rotation 4- 3+ 4- 4- 4 4  Knee flexion 4 4 4- 4 4+ 4+  Knee extension 4 4 4- 4 4+ 4+  Ankle dorsiflexion 4- 4- 4- 4 4+ 4+  Ankle plantarflexion        Ankle inversion        Ankle eversion        (Blank rows = not tested)  BED MOBILITY:  SBA  TRANSFERS: Assistive device utilized: None  Sit to stand: Modified independence and SBA Stand to sit: Modified independence and SBA Chair to chair: SBA Floor: NT  GAIT: Distance walked: clinic distances Assistive device utilized: Single point cane, platform RW/2WW, and None Level of assistance: SBA Gait pattern: step through pattern, decreased arm swing- Right, decreased arm swing- Left, decreased stride length, decreased hip/knee flexion- Right, decreased hip/knee flexion- Left, decreased ankle dorsiflexion- Right, decreased ankle dorsiflexion- Left, shuffling, festinating, trunk flexed, poor foot clearance- Right, and poor foot clearance- Left Comments: gait deviations more pronounced with decreasing AD support  FUNCTIONAL TESTS:  5 times sit to stand: unable w/o UE assist - only able to complete 4 reps in 34.03 sec; 20.18 sec with B UE assist Timed up and go (TUG): Normal - 15.84 sec w/o AD, Manual - 15.19 sec w/o AD, Cognitive - 15.66 sec w/o AD 10 meter walk test: 18.25 sec w/o AD, 16.56 sec with SPC, 14.78 sec with platform RW Gait speed: 1.80 ft/sec w/o AD, 1.98 ft/sec with SPC, 2.22 ft/sec with platform RW Berg balance scale: 41/56, 37-45 indicates significant (>80%) risk for falls (06/26/24) DGI: 12/24, <19/24 indicates high risk for falls (06/26/24)  PATIENT SURVEYS:  ABC scale: 620 / 1600 = 38.8 %,  indicating a low level of physical functioning (<69% indicates risk for recurrent falls in PD) 09/06/24 - ABC scale: 09/06/24 - 160 / 1600 = 10.0 %   TODAY'S TREATMENT:   10/22/24  THERAPEUTIC ACTIVITIES: To improve functional performance.  Demonstration, verbal and tactile cues throughout for technique. 5xSTS = 18.84 sec with B UE assist, only able to complete 2-3 reps on attempts w/o UE assist TUG: 21.21 sec with platform 4WW Normal TUG = 17.37 sec w/o AD Manual TUG = 21.35 sec w/o AD Cognitive = 23.72 sec w/o AD 10WMT: 15.50 sec with platform 4WW 20.59 sec w/o AD Gait speed: 2.12 ft/sec with platform 4WW 1.59 ft/sec w/o AD Goal assessment  PHYSICAL PERFORMANCE TEST or MEASUREMENT:  Berg Balance Test   Sit to Stand Able to stand without using hands and stabilize independently    Standing Unsupported Able to stand safely 2 minutes    Sitting with Back Unsupported but Feet Supported on Floor or Stool Able to sit safely and securely 2 minutes    Stand to Sit Sits safely with minimal use of hands    Transfers Able to transfer safely, minor use of hands    Standing Unsupported with Eyes  Closed Able to stand 10 seconds with supervision    Standing Unsupported with Feet Together Able to place feet together independently and stand 1 minute safely    From Standing, Reach Forward with Outstretched Arm Can reach forward >12 cm safely (5)    From Standing Position, Pick up Object from Floor Able to pick up shoe safely and easily    From Standing Position, Turn to Look Behind Over each Shoulder Looks behind from both sides and weight shifts well    Turn 360 Degrees Able to turn 360 degrees safely but slowly    Standing Unsupported, Alternately Place Feet on Step/Stool Able to complete >2 steps/needs minimal assist    Standing Unsupported, One Foot in Front Able to take small step independently and hold 30 seconds    Standing on One Leg Tries to lift leg/unable to hold 3 seconds but remains  standing independently    Total Score 44    Berg comment: 37-45 = Significant (>80%) fall risk      Dynamic Gait Index   Level Surface Mild Impairment    Change in Gait Speed Mild Impairment    Gait with Horizontal Head Turns Mild Impairment    Gait with Vertical Head Turns Mild Impairment    Gait and Pivot Turn Normal    Step Over Obstacle Moderate Impairment    Step Around Obstacles Mild Impairment    Steps Moderate Impairment    Total Score 15    DGI comment: Scores of 19 or less are predictive of falls in older community living adults      SELF CARE:  Verbally reviewed current HEP clarifying recommended frequency for ongoing performance to maintain gains achieved with PT and prevent future decline in function. Reviewed education on community resources related to Parkinson's including support group and educational sessions, community-based PWR! Moves and exercise/movement groups as well as online resources related to PD.   Discussed plan for return in 6 months for PD screen.  Patient aware that he can always reach out to his PCP or neurologist for new PT orders if issues were to arise prior to the 33-month screen date.   10/18/24  THERAPEUTIC EXERCISE: To improve strength and endurance.  Demonstration, verbal and tactile cues throughout for technique.  NuStep - L6 x 6 min (LE only) Leg curls 15lb x 10 BLE Leg ext 5lb x 10 BLE  THERAPEUTIC ACTIVITIES: To improve functional performance.  Demonstration, verbal and tactile cues throughout for technique.  Standing hip ABD with 3lb weights 2 x 10 B - UE support  Standing hip extension with 3lb weight 2 x 10 B - UE support Standing toe tap 3lb BLE 2x10 one UE support Retro step + ipsilateral arm raise B x 10 B - single UE support on counter Standing heel/toe raises BLE 2x10   10/15/24  THERAPEUTIC EXERCISE: To improve strength and endurance.  Demonstration, verbal and tactile cues throughout for technique.  NuStep - L6 x 6 min (LE  only) Gait for endurance x 650' with platform [id number], with seated rest break on walker seat after 490' feet  THERAPEUTIC ACTIVITIES: To improve functional performance.  Demonstration, verbal and tactile cues throughout for technique.  LE MMT Retro step + ipsilateral arm raise B x 10 B - single UE support on counter Standing hip ABD with looped RTB at ankles x 10 B - UE support on counter Standing hip extension with looped RTB at ankles x 10 B - UE support on counter Standing  hip flexion march with looped RTB at midfeet x 10 - UE support on counter   10/09/24 THERAPEUTIC EXERCISE: To improve strength and endurance.  Demonstration, verbal and tactile cues throughout for technique.  NuStep - L6 x 6 min (LE only)  Powr! Moves Seated: 2/5 each  Power rock  Power step  Power twist  Power up  Standing:  2/5 each  Power rock  Power step  Power twist  Power up  Standing reciprocal arm swings x 10 BUE w/ PT assist Standing trunk rotations x 10 BUE w/ PT assist Pool noodle step overs S/S X 10 each;  F/B X 2/5 each  THERAPEUTIC ACTIVITIES: To improve functional performance.  Demonstration, verbal and tactile cues throughout for technique. Seated hip ER YTB x 20 BLE   10/04/2024 THERAPEUTIC EXERCISE: To improve strength and endurance.  Demonstration, verbal and tactile cues throughout for technique.  NuStep - L6 x 6 min (LE only) 5xSTS- first trial no hands 42 seconds; 2nd trial with hands- 25.28 seconds ABC scale DGI Dynamic Gait Index  Level Surface Moderate Impairment   Change in Gait Speed Mild Impairment   Gait with Horizontal Head Turns Mild Impairment   Gait with Vertical Head Turns Mild Impairment   Gait and Pivot Turn Normal   Step Over Obstacle Mild Impairment   Step Around Obstacles Normal   Steps Moderate Impairment   Total Score 16        10/02/2024 THERAPEUTIC EXERCISE: To improve strength and endurance.  Demonstration, verbal and tactile cues throughout for  technique.  NuStep - L5 x 6 min (LE only)  THERAPEUTIC ACTIVITIES: To improve functional performance.  Demonstration, verbal and tactile cues throughout for technique. Standing at wall ladder for support: Alt hip extension 2 x 10 Alt hip ABD 2 x 10 Alt hip flexion march 2 x 10 Alt knee flexion/HS curl 2 x 10 Heel-toe raises 2 x 10 Mini-squat 2 x 10 Step stance lunge 4 x 20 BLE Step ups BLE 4 x 10  Step over yard stick and back forward x 10B; lateral x 10 B   09/27/2024 THERAPEUTIC EXERCISE: To improve strength and endurance.  Demonstration, verbal and tactile cues throughout for technique.  NuStep - L6 x 6 min (LE only) Seated alt LAQ 2 x 10  THERAPEUTIC ACTIVITIES: To improve functional performance.  Demonstration, verbal and tactile cues throughout for technique. Standing at wall ladder for support: Alt hip extension 2 x 10 Alt hip ABD 2 x 10 Alt hip flexion march 2 x 10 Alt knee flexion/HS curl 2 x 10 Heel-toe raises 2 x 10 Mini-squat 2 x 10 = 17.07 sec with platform 4WW Gait speed = 1.92 ft/sec with platform 4WW TUG = 26.93 sec with platform 4WW Alt fwd step-over and back using yardstick as target to increase step length and foot clearance - single UE support on counter for balance  GAIT TRAINING: To normalize gait pattern and improve safety with platform 4WW.  400' with platform [phone] - cues for increased stride length and increased hip and knee flexion to reduce shuffling with gait and reduce tripping hazard   09/25/2024  THERAPEUTIC EXERCISE: To improve strength and endurance.  Demonstration, verbal and tactile cues throughout for technique.  NuStep - L5 x 6 min (started with B UE/LE, but shifted to LE only d/t irritating UE pain) Seated GTB B hip ABD/ER clam 2 x 10 Seated GTB alt hip flexion march 2 x 10  THERAPEUTIC ACTIVITIES: To improve functional  performance.  Demonstration, verbal and tactile cues throughout for technique. Orthostatic BP assessment:   Time BP HR Symptoms  Lying down 5 min 132/70 84 No dizziness  Sitting 1-2 min 112/60 83 Spinning dizziness  Standing 1-2 min 104/56 91 Spinning dizziness  Sit to stand w/o UE assist from slightly elevated treatment table focusing on increased fwd weight shift to reduce posterior LOB.  SELF CARE: Provided education to reduce fall risk.  Provided education on orthostatic/postural hypotension including symptoms, causes and strategies to minimize symptoms and reduce risk for falls: Need for adequate hydration  Slow transitions with pause to ensure no dizziness or that dizziness has resolved Gentle LE exercises before initiation of movement   09/19/24 THERAPEUTIC EXERCISE: To improve strength and endurance.  Demonstration, verbal and tactile cues throughout for technique. NuStep - L5 x 6 min (LE only) Seated LAQ BLE  10 Seated marching BLE x 10  NEUROMUSCULAR RE-EDUCATION:  Berg Balance Test  Sit to Stand Able to stand without using hands and stabilize independently   Standing Unsupported Able to stand safely 2 minutes   Sitting with Back Unsupported but Feet Supported on Floor or Stool Able to sit safely and securely 2 minutes   Stand to Sit Sits safely with minimal use of hands   Transfers Able to transfer safely, minor use of hands   Standing Unsupported with Eyes Closed Able to stand 10 seconds safely   Standing Unsupported with Feet Together Able to place feet together independently and stand 1 minute safely   From Standing, Reach Forward with Outstretched Arm Can reach confidently >25 cm (10)   From Standing Position, Pick up Object from Floor Able to pick up shoe safely and easily   From Standing Position, Turn to Look Behind Over each Shoulder Looks behind from both sides and weight shifts well   Turn 360 Degrees Able to turn 360 degrees safely but slowly   Standing Unsupported, Alternately Place Feet on Step/Stool Needs assistance to keep from falling or unable to try    Standing Unsupported, One Foot in Front Needs help to step but can hold 15 seconds   Standing on One Leg Unable to try or needs assist to prevent fall   Total Score 43   Interpretation: 37-45 indicates significant (>80%) risk for falls (06/26/24)      09/18/24 THERAPEUTIC EXERCISE: To improve strength and endurance.  Demonstration, verbal and tactile cues throughout for technique. NuStep - L5 x 6 min (LE only) NEUROMUSCULAR RE-EDUCATION:  Seated on dynadisk hands on table for support: Sitting balance LAQ x 5 BLE- very difficult Perturbations 4 ways Marching x 5 BLE  Gait training:  270 with roller walker - some R foot drag, very short steps B  Sit to stands with chair in front very challenging 3x lots of effort standing even with support  09/06/2024 - Recertification THERAPEUTIC ACTIVITIES: To improve functional performance.  Demonstration, verbal and tactile cues throughout for technique. LE MMT 5xSTS = 32.06 sec with B UE assist TUG = 40.50 sec with platform 4WW ABC Scale: 09/06/24 - 160 / 1600 = 10.0 % = 28.63 sec with platform 4WW Gait speed = 1.15 ft/sec with platform 4WW Goal assessment   PATIENT EDUCATION:  Education details: HEP review, recommended frequency for ongoing HEP at discharge to prevent loss of gains achieved with PT, and reviewed information and provided handout(s) on community focused exercise programs for pt's with PD or movement disorders to prevent loss of gains achieved with PT  and slow progression of PD related deficits  Person educated: Patient Education method: Explanation Education comprehension: verbalized understanding  HOME EXERCISE PROGRAM: *Pt has MedBridgeGO app access.   Access Code: [email or web address] URL: [email or web address] Date: 10/15/2024 Prepared by: Elijah Hidden  Exercises - Seated Hip Abduction with Resistance  - 1 x daily - 3 x weekly - 3 sets - 10 reps - Seated Hip Adduction Isometrics with Ball  - 1 x daily -  3 x weekly - 3 sets - 10 reps - Seated March with Resistance  - 1 x daily - 3 x weekly - 3 sets - 10 reps - Sit to Stand  - 1 x daily - 3 x weekly - 1 sets - 5 reps - Seated Hamstring Stretch  - 1-2 x daily - 7 x weekly - 3 reps - 30 sec hold - Seated Figure 4 Piriformis Stretch  - 1-2 x daily - 7 x weekly - 3 reps - 30 sec hold - Seated Hip Flexor Stretch  - 1-2 x daily - 7 x weekly - 3 reps - 30 sec hold - Standing Hip Abduction with Counter Support  - 1 x daily - 7 x weekly - 2 sets - 10 reps - Standing Hip Extension with Counter Support  - 1 x daily - 7 x weekly - 2 sets - 10 reps - Standing March with Counter Support  - 1 x daily - 7 x weekly - 2 sets - 10 reps - Heel Raises with Counter Support  - 1 x daily - 7 x weekly - 2 sets - 10 reps - Retro Step  - 1 x daily - 7 x weekly - 2 sets - 10 reps - 3 sec hold  Patient Education - Check for Safety - Orthostatic Hypotension - Postural Hypotension  PWR Moves: - Sitting - Standing (modified)   ASSESSMENT:  CLINICAL IMPRESSION: Sahmir has recovered well from his set-back as of mid/late October and is now able to safety transfer and ambulate with his platform 4WW.  Standardized balance testing completed with gains seen on all tests, however all remain shy of the goal levels and continue to indicate a significant (>80%) to high fall risk.  He reports he has been walking some in his apartment w/o the AD, however given the results of the balance testing, PT encouraged continued use of an AD at all times when ambulatory to reduce fall risk.  Only some of the PT goals now met, however insurance authorization has been exhausted for this PT episode. [place] feels ready to transition to his HEP, therefore will proceed with discharge from physical therapy for this episode.  OBJECTIVE IMPAIRMENTS: Abnormal gait, decreased activity tolerance, decreased balance, decreased coordination, decreased endurance, decreased knowledge of condition, decreased  knowledge of use of DME, decreased mobility, difficulty walking, decreased strength, decreased safety awareness, dizziness, impaired perceived functional ability, impaired flexibility, improper body mechanics, postural dysfunction, and pain.   ACTIVITY LIMITATIONS: carrying, lifting, bending, standing, squatting, stairs, transfers, bed mobility, reach over head, locomotion level, and caring for others  PARTICIPATION LIMITATIONS: meal prep, cleaning, laundry, shopping, and community activity  PERSONAL FACTORS: Age, Fitness, Past/current experiences, Time since onset of injury/illness/exacerbation, and 3+ comorbidities: Anxiety, asthma, bipolar affective disorder, COPD, depression, DM-II, DJD, chronic knee pain, B TKA, GERD, HTN, Mnire's disease, obesity, pancreatitis, h/o CVA, neurologic dysphagia, tardive dyskinesia, HOH - L deaf & R hearing aid are also affecting patient's functional outcome.   REHAB POTENTIAL: Good  CLINICAL DECISION MAKING: Unstable/unpredictable  EVALUATION COMPLEXITY: High   GOALS: Goals reviewed with patient? Yes  SHORT TERM GOALS: Target date: 07/30/2024, extended to 10/04/2024  Patient will be independent with initial HEP. Baseline: To be established 07/05/24 - consistent with current HEP  07/12/24 - clarified HEP frequency Goal status: MET - 08/06/24   2.  Patient will demonstrate decreased fall risk by scoring < 13.5 sec on normal TUG. Baseline: 15.84 sec w/o AD 08/06/24 - 19.88 sec with platform 4WW (pt misjudged seat and attempted to sit on armrest of chair), 23.06 sec w/o AD 08/13/24 - 21.01 sec with platform 4WW 09/05/24 - 40.50 sec with platform 4WW 09/27/24 - 26.93 sec with platform 4WW Goal status: NOT MET - 21.21 sec with platform 4WW, 17.37 sec w/o AD  3.  Patient will be educated on strategies to decrease risk of falls.  Baseline:  Goal status: MET - 07/09/24   LONG TERM GOALS: Target date: 09/10/2024, extended to 11/01/2024  Patient will be  independent with ongoing/advanced HEP for self-management at home incorporating PWR! Moves as indicated .  Baseline:  08/13/24 - met for current HEP  09/25/24 - HEP reviewed and updated 10/15/24 - HEP reviewed and updated Goal status: MET - 10/22/24  2.  Patient will be able to ambulate 600' with LRAD with good safety to access community.  Baseline:  08/09/24 - 270' with standing 4WW Goal status: MET - 10/15/24 - 650' with platform 4WW, seated rest break required on walker seat after 490'  3.  Patient will be able to step up/down curb safely with LRAD for safety with community ambulation.  Baseline:  Goal status: DEFERRED - 08/16/24 - pt reports he does not attempt curbs with platform 4WW as walker too heavy to navigate up curb   4.  Patient will demonstrate gait speed of >/= 2.62 ft/sec (0.8 m/s) to be a safe limited community ambulator with decreased risk for recurrent falls.  Baseline: 1.80 ft/sec w/o AD, 1.98 ft/sec with SPC and 2.22 ft/sec with platform RW 07/19/24 - 2.26 ft/sec 08/16/24 - 2.16 ft/sec (normal speed), 2.37 ft/sec (fastest comfortable speed) with upright/platform 4WW 09/06/24 - 1.15 ft/sec with platform 4WW 09/27/24 - 1.92 ft/sec with platform 4WW Goal status: NOT MET - 2.12 ft/sec with platform 4WW, 1.59 ft/sec w/o AD  5.  Patient will improve 5x STS time to </= 15 seconds w/o need for UE assist to demonstrate improved functional strength and transfer efficiency. Baseline: unable w/o UE assist - only able to complete 4 reps in 34.03 sec; 20.18 sec with B UE assist 08/06/24 - 20.50 sec with B UE assist 10/04/24 - no hands 42 seconds; 2nd trial with hands 25.28 seconds Goal status: NOT MET - 10/22/24 - 18.84 sec with B UE assist  6.  Patient will demonstrate at least 19/24 on DGI to improve gait stability and reduce risk for falls. Baseline: 12/24 (06/26/24) 08/13/24 - 16/24 09/19/24 16/24 10/04/24- 16/24 Goal status: NOT MET - 10/22/24 - 15/24  7.  Patient will improve Berg  score by at least 8 points to improve safety and stability with ADLs in standing and reduce risk for falls. (MCID = 8 points)   Baseline: 41/56 (06/26/24) 08/13/24 - 47/56 09/19/24 - 43/56 Goal status: NOT MET - 10/22/24 - 44/56  8.  Patient will report >/= 52% on ABC scale to demonstrate improved balance confidence and decreased risk for falls. Baseline: 620 / 1600 = 38.8 % 08/13/24 - 810 / 1600 = 50.6 % 09/06/24 - 160 /  1600 = 10.0 % Goal status: NOT MET - 10/18/24 - 620 / 1600 = 38.8 %  9. Patient will verbalize understanding of local Parkinson's disease community resources, including community fitness post d/c. Baseline:  08/16/24 - handouts provided Goal status: MET - 10/15/24 - reviewed resources for community programs but declined interest in pursuing any programs currently    PLAN:  PT FREQUENCY: 2x/week  PT DURATION: 12 weeks  PLANNED INTERVENTIONS: 97164- PT Re-evaluation, 97750- Physical Performance Testing, 97110-Therapeutic exercises, 97530- Therapeutic activity, 97112- Neuromuscular re-education, 97535- Self Care, 02859- Manual therapy, [id number]- Gait training, [phone]- Canalith repositioning, H9716- Electrical stimulation (unattended), 97035- Ultrasound, [id number]- Ionotophoresis 4mg /ml Dexamethasone , 79439 (1-2 muscles), 20561 (3+ muscles)- Dry Needling, Patient/Family education, Balance training, Stair training, Taping, Joint mobilization, Vestibular training, DME instructions, Cryotherapy, and Moist heat  PLAN FOR NEXT SESSION: transition to HEP + DC from PT; return for 44-month PD screen on 04/22/24    PHYSICAL THERAPY DISCHARGE SUMMARY  Visits from Start of Care: 22  Current functional level related to goals / functional outcomes: Refer to above clinical impression and goal assessment.    Remaining deficits: Significant (>80%) to high fall risk per standardized balance testing indicating need for use of AD with all ambulation   Education / Equipment: HEP Standardized balance  testing interpretation Community resources related to Parkinson's   Patient agrees to discharge. Patient goals were partially met. Patient is being discharged due to exhausted insurance authorization.   Elijah CHRISTELLA Hidden, PT  10/22/2024, 3:55 PM       Date of referral: 05/31/24 Referring provider: Amon Aloysius BRAVO, MD Referring diagnosis?  G21.11,T43.505A (ICD-10-CM) - Neuroleptic-induced parkinsonism (HCC)  R26.9 (ICD-10-CM) - Gait disorder  R29.6 (ICD-10-CM) - Multiple falls   Treatment diagnosis? (if different than referring diagnosis)  Muscle weakness (generalized)  Unsteadiness on feet  Other abnormalities of gait and mobility  History of falling  What was this (referring dx) caused by? Ongoing Issue and Other: neurodegenerative disease  Nature of Condition: Chronic (continuous duration > 3 months)   Laterality: Both  Current Functional Measure Score: Other ABC Scale:640 / 1600 = 40.0 %    Objective measurements identify impairments when they are compared to normal values, the uninvolved extremity, and prior level of function.  [x]  Yes  []  No  Objective assessment of functional ability: Moderate functional limitations   Briefly describe symptoms:  Camar is progressing with PT demonstrating improving tolerance for standing activities, improving gait safety and speed with his platform 4WW, and decreasing fall risk per improvements in Berg from 41/56 to 47/56 (indicating a reduction in fall risk from significant (greater than 80%) to moderate moderate (>50%) and DGI from 12/24 to 16/24 (scores of <19/24 still indicating high risk for falls) today.  He has not had any falls in the past month.  Johnpaul will benefit from continued skilled PT to address ongoing motor control, strength and balance deficits to improve mobility and activity tolerance with decreased pain interference and decreased risk for falls.   How did symptoms start: gradual onset  Average pain intensity:  Last 24  hours: 3-4/10  Past week: 3-4/10  How often does the pt experience symptoms? Constantly  How much have the symptoms interfered with usual daily activities? Quite a bit  How has condition changed since care began at this facility? Better   In general, how is the patients overall health? Fair  Onset date: Parkinsonism diagnosis in 2013, worsening weakness over the past 6 months    BACK PAIN (  STarT Back Screening Tool) - (When applicable):   Has your back pain spread down your leg(s) at sometime in the last 2 weeks? []  Yes   [x]  No Have you had pain in the shoulder or neck at sometime in the past 2 weeks? [x]  Yes   []  No Have you only walked short distances because of your back pain? []  Yes   [x]  No In the past 2 weeks, have you dressed more slowly than usual because of your back pain? []  Yes   [x]  No Do you think it is not really safe for person with a condition like yours to be physically active? []  Yes   [x]  No Have worrying thoughts been going through your mind a lot of the time? [x]  Yes   []  No Do you feel that your back pain is terrible and it is never going to get any better? []  Yes   [x]  No In general, have you stopped enjoying all the things you usually enjoy? []  Yes   [x]  No Overall, how bothersome has your back pain been in the last 2 weeks? []  Not at all   [x]  Slightly     []  Moderate   []  Very much     []  Extremely

## 2024-10-25 ENCOUNTER — Ambulatory Visit: Admitting: Physical Therapy

## 2024-10-30 ENCOUNTER — Ambulatory Visit: Admitting: Physical Therapy

## 2024-11-05 ENCOUNTER — Ambulatory Visit: Admitting: Urology

## 2024-11-05 ENCOUNTER — Encounter: Payer: Self-pay | Admitting: Urology

## 2024-11-05 VITALS — BP 124/71 | HR 86

## 2024-11-05 DIAGNOSIS — D074 Carcinoma in situ of penis: Secondary | ICD-10-CM

## 2024-11-05 DIAGNOSIS — N489 Disorder of penis, unspecified: Secondary | ICD-10-CM

## 2024-11-05 NOTE — Progress Notes (Signed)
 "  Assessment: 1. Penile lesion     Plan: Biopsy of penile lesion performed today. Will contact him with results of biopsy. Local care to biopsy site with antibiotic ointment. Resume Plavix  in 2 days if no bleeding.  Chief Complaint: Chief Complaint  Patient presents with   Penile lesion    HPI: Ryan Zhang is a 78 y.o. male who presents for continued evaluation of penile lesion, hidden penis, BXO with meatal stenosis, BPH with LUTS. He was followed for a number of years by [facility].  He was seen by Dr. Shona in February 2024 for evaluation of his lower urinary tract symptoms.  He has a long history of BPH with lower urinary tract symptoms with prominent urgency.  He previously took tamsulosin  and alfuzosin without benefit.  He was taking Vesicare  10 mg daily with some improvement.  At his last visit in February 2025, his lower urinary tract symptoms were fairly stable with the solifenacin . IPSS = 15/3.  He has a long history of BXO with meatal stenosis.  He is status post a circumcision with repeat circumcision and urethral dilation in 2010.  Attempted office cystoscopy in June 2024 was unsuccessful due to his phimosis and BXO. He was seen in the emergency room in May 2025 for dysuria and penile pain.  He was treated for balanitis with oral fluconazole .  PSA 2/24: 0.20  Urine culture from 5/24 grew >100 K Klebsiella.  He was seen in June 2025 for evaluation of his hidden penis.  He reported problems with balanitis.  He is unable to retract his foreskin.  He voids sitting down and has a slow stream.  He was not having any dysuria.  No gross hematuria.  He continued on solifenacin  with stable lower urinary tract symptoms.  He was seen in 11/25 for evaluation of a red lesion on the foreskin. This lesion had been present for approximately 2 months.  He had tried topical therapy with nystatin  and Lotrisone  cream.  He was also given a course of Diflucan .  He had not seen a change  in the lesion. He continued on solifenacin .  He continued with lower urinary tract symptoms including a weak stream, intermittent stream, and incomplete emptying.  He was having some intermittent dysuria.  No gross hematuria. IPSS = 20/3.  He presents today for a biopsy of the penile lesion.  No change in the lesion since his last visit.  Portions of the above documentation were copied from a prior visit for review purposes only.  Allergies: Allergies  Allergen Reactions   Celexa  [Citalopram Hydrobromide] Other (See Comments)   Depakote  [Divalproex Sodium] Other (See Comments)   Methocarbamol     Other reaction(s): Hallucinations   Paroxetine Hcl     Other reaction(s): Insomnia   Smz-Tmp Ds [Sulfamethoxazole-Trimethoprim] Nausea And Vomiting   Other     Symbrax alteration in blood sugar, pt unsure if low or high blood sugar   Oxycodone-Acetaminophen      Other reaction(s): Unknown   Risperidone Other (See Comments)    REACTION: hyper    PMH: Past Medical History:  Diagnosis Date   Allergy    allergy shots Dr. Cloretta   Anemia    Anxiety    Asthma    moderate persistant   Bipolar affective (HCC)    Cataract    both eyes   Clotting disorder    Due to blood thinner   COPD (chronic obstructive pulmonary disease) (HCC)    Depression  Diabetes mellitus    DJD (degenerative joint disease)    Dysphagia    Gallstones    hx of, s/p cholecystectomy   GERD (gastroesophageal reflux disease)    Hepatitis A    as teenager 60's   Hollenhorst plaque    right eye   Hyperlipidemia    Hypertension    Kidney stones    hx of   Meniere's disease    Motility disorder, esophageal    Obesity    Pancreatitis    Personal history of colonic polyps 11/2004   hyperplastic.   Stroke [facility])    per MRI    PSH: Past Surgical History:  Procedure Laterality Date   CATARACT EXTRACTION Bilateral 09/2015 and 10/2015   CHOLECYSTECTOMY     COLONOSCOPY     EYE SURGERY     JOINT  REPLACEMENT     TOTAL KNEE ARTHROPLASTY Bilateral    both knees    SH: Social History   Tobacco Use   Smoking status: Former    Current packs/day: 0.00    Average packs/day: 1 pack/day for 1 year (1.0 ttl pk-yrs)    Types: Cigarettes    Start date: 11/16/1963    Quit date: 11/15/1964    Years since quitting: 60.0   Smokeless tobacco: Never   Tobacco comments:    Questions not useful  Vaping Use   Vaping status: Never Used  Substance Use Topics   Alcohol use: Not Currently   Drug use: No    ROS: Constitutional:  Negative for fever, chills, weight loss CV: Negative for chest pain, previous MI, hypertension Respiratory:  Negative for shortness of breath, wheezing, sleep apnea, frequent cough GI:  Negative for nausea, vomiting, bloody stool, GERD  PE: BP 124/71   Pulse 86  GENERAL APPEARANCE:  Well appearing, well developed, well nourished, NAD GU: Penis:  hidden penis; erythematous plaque on ventral preputial skin   Results: None  Procedure:  Biopsy of penile lesion Informed consent obtained. The patient's penis was prepped and draped in sterile fashion.  1% lidocaine  was used for local anesthetic.  Using a 5 mm punch biopsy, 2 biopsies of the abnormal appearing penile lesion were obtained.  Specimens were sent to pathology.  Hemostasis was obtained with the cautery.  Interrupted 3-0 chromic sutures were placed to approximate the skin edges.  Good hemostasis was obtained.  He tolerated the procedure well. "

## 2024-11-13 LAB — ANATOMIC PATHOLOGY REPORT

## 2024-11-14 ENCOUNTER — Ambulatory Visit: Payer: Self-pay | Admitting: Urology

## 2024-11-16 ENCOUNTER — Ambulatory Visit

## 2024-11-27 ENCOUNTER — Other Ambulatory Visit: Payer: Self-pay

## 2024-11-27 MED ORDER — SOLIFENACIN SUCCINATE 10 MG PO TABS: 10.0000 mg | ORAL_TABLET | Freq: Every day | ORAL | 3 refills | Status: AC

## 2024-12-05 ENCOUNTER — Other Ambulatory Visit: Payer: Self-pay

## 2024-12-05 ENCOUNTER — Ambulatory Visit: Admitting: Urology

## 2024-12-05 ENCOUNTER — Other Ambulatory Visit (HOSPITAL_BASED_OUTPATIENT_CLINIC_OR_DEPARTMENT_OTHER): Payer: Self-pay

## 2024-12-05 ENCOUNTER — Encounter: Payer: Self-pay | Admitting: Urology

## 2024-12-05 VITALS — BP 129/79 | HR 84

## 2024-12-05 DIAGNOSIS — Z5111 Encounter for antineoplastic chemotherapy: Secondary | ICD-10-CM

## 2024-12-05 DIAGNOSIS — D4959 Neoplasm of unspecified behavior of other genitourinary organ: Secondary | ICD-10-CM | POA: Insufficient documentation

## 2024-12-05 DIAGNOSIS — Q5564 Hidden penis: Secondary | ICD-10-CM | POA: Insufficient documentation

## 2024-12-05 DIAGNOSIS — D074 Carcinoma in situ of penis: Secondary | ICD-10-CM

## 2024-12-05 MED ORDER — FLUOROURACIL 5 % EX CREA
1.0000 | TOPICAL_CREAM | Freq: Every evening | CUTANEOUS | 0 refills | Status: AC
  Filled 2024-12-05: qty 40, 30d supply, fill #0

## 2024-12-05 NOTE — Progress Notes (Signed)
 "  Assessment: 1. Penile intraepithelial neoplasia   2. Hidden penis     Plan: I reviewed the pathology results with the patient today. Given the findings of intraepithelial neoplasia involving the prepuce, I recommended treatment with 5-FU.  I discussed that studies suggest a complete response in approximately 50-80% of patients.  I discussed potential side effects of topical 5-FU including erythema, inflammation, and discomfort. I recommended that he use this nightly for 4 weeks. Instructions provided to the patient. Return to office on 01/11/25 as scheduled  Chief Complaint: Chief Complaint  Patient presents with   Penile intraepithelial neoplasia     HPI: Ryan Zhang is a 79 y.o. male who presents for continued evaluation of penile lesion, hidden penis, BXO with meatal stenosis, BPH with LUTS. He was followed for a number of years by [facility].  He was seen by Dr. Shona in February 2024 for evaluation of his lower urinary tract symptoms.  He has a long history of BPH with lower urinary tract symptoms with prominent urgency.  He previously took tamsulosin  and alfuzosin without benefit.  He was taking Vesicare  10 mg daily with some improvement.  At his last visit in February 2025, his lower urinary tract symptoms were fairly stable with the solifenacin . IPSS = 15/3.  He has a long history of BXO with meatal stenosis.  He is status post a circumcision with repeat circumcision and urethral dilation in 2010.  Attempted office cystoscopy in June 2024 was unsuccessful due to his phimosis and BXO. He was seen in the emergency room in May 2025 for dysuria and penile pain.  He was treated for balanitis with oral fluconazole .  PSA 2/24: 0.20  Urine culture from 5/24 grew >100 K Klebsiella.  He was seen in June 2025 for evaluation of his hidden penis.  He reported problems with balanitis.  He is unable to retract his foreskin.  He voids sitting down and has a slow stream.  He was not  having any dysuria.  No gross hematuria.  He continued on solifenacin  with stable lower urinary tract symptoms.  He was seen in 11/25 for evaluation of a red lesion on the foreskin. This lesion had been present for approximately 2 months.  He had tried topical therapy with nystatin  and Lotrisone  cream.  He was also given a course of Diflucan .  He had not seen a change in the lesion. He continued on solifenacin .  He continued with lower urinary tract symptoms including a weak stream, intermittent stream, and incomplete emptying.  He was having some intermittent dysuria.  No gross hematuria. IPSS = 20/3.  He underwent a biopsy of the lesion on the foreskin on 11/05/2024. The pathology showed intraepithelial neoplasia.  Portions of the above documentation were copied from a prior visit for review purposes only.  Allergies: Allergies  Allergen Reactions   Celexa  [Citalopram Hydrobromide] Other (See Comments)   Depakote  [Divalproex Sodium] Other (See Comments)   Methocarbamol     Other reaction(s): Hallucinations   Paroxetine Hcl     Other reaction(s): Insomnia   Smz-Tmp Ds [Sulfamethoxazole-Trimethoprim] Nausea And Vomiting   Other     Symbrax alteration in blood sugar, pt unsure if low or high blood sugar   Oxycodone-Acetaminophen      Other reaction(s): Unknown   Risperidone Other (See Comments)    REACTION: hyper    PMH: Past Medical History:  Diagnosis Date   Allergy    allergy shots Dr. Cloretta   Anemia  Anxiety    Asthma    moderate persistant   Bipolar affective (HCC)    Cataract    both eyes   Clotting disorder    Due to blood thinner   COPD (chronic obstructive pulmonary disease) (HCC)    Depression    Diabetes mellitus    DJD (degenerative joint disease)    Dysphagia    Gallstones    hx of, s/p cholecystectomy   GERD (gastroesophageal reflux disease)    Hepatitis A    as teenager 60's   Hollenhorst plaque    right eye   Hyperlipidemia    Hypertension     Kidney stones    hx of   Meniere's disease    Motility disorder, esophageal    Obesity    Pancreatitis    Personal history of colonic polyps 11/2004   hyperplastic.   Stroke [facility])    per MRI    PSH: Past Surgical History:  Procedure Laterality Date   CATARACT EXTRACTION Bilateral 09/2015 and 10/2015   CHOLECYSTECTOMY     COLONOSCOPY     EYE SURGERY     JOINT REPLACEMENT     TOTAL KNEE ARTHROPLASTY Bilateral    both knees    SH: Social History   Tobacco Use   Smoking status: Former    Current packs/day: 0.00    Average packs/day: 1 pack/day for 1 year (1.0 ttl pk-yrs)    Types: Cigarettes    Start date: 11/16/1963    Quit date: 11/15/1964    Years since quitting: 60.0   Smokeless tobacco: Never   Tobacco comments:    Questions not useful  Vaping Use   Vaping status: Never Used  Substance Use Topics   Alcohol use: Not Currently   Drug use: No    ROS: Constitutional:  Negative for fever, chills, weight loss CV: Negative for chest pain, previous MI, hypertension Respiratory:  Negative for shortness of breath, wheezing, sleep apnea, frequent cough GI:  Negative for nausea, vomiting, bloody stool, GERD  PE: BP 129/79   Pulse 84  GENERAL APPEARANCE:  Well appearing, well developed, well nourished, NAD GU: Penis:  hidden penis; erythematous lesion on ventral preputial skin; biopsy sites well healed   Results: None  "

## 2024-12-05 NOTE — Addendum Note (Signed)
 Addended by: Geanna Divirgilio N on: 12/05/2024 12:00 PM   Modules accepted: Orders

## 2024-12-16 ENCOUNTER — Other Ambulatory Visit: Payer: Self-pay | Admitting: Internal Medicine

## 2024-12-28 ENCOUNTER — Ambulatory Visit: Admitting: Internal Medicine

## 2025-01-10 ENCOUNTER — Ambulatory Visit: Payer: Medicare Other | Admitting: Urology

## 2025-01-11 ENCOUNTER — Ambulatory Visit: Admitting: Urology

## 2025-01-18 ENCOUNTER — Ambulatory Visit: Admitting: Internal Medicine

## 2025-01-29 ENCOUNTER — Ambulatory Visit: Admitting: Neurology

## 2025-04-22 ENCOUNTER — Ambulatory Visit: Admitting: Physical Therapy

## 2025-05-21 ENCOUNTER — Ambulatory Visit
